# Patient Record
Sex: Female | Born: 1950 | Race: White | Hispanic: No | Marital: Single | Smoking: Never smoker
Health system: Southern US, Community
[De-identification: ages and names within clinical notes are randomized; demographics above are authoritative.]

## PROBLEM LIST (undated history)

## (undated) ENCOUNTER — Inpatient Hospital Stay: Admission: EM | Payer: Self-pay | Source: Home / Self Care

## (undated) DIAGNOSIS — F32A Depression, unspecified: Secondary | ICD-10-CM

## (undated) DIAGNOSIS — D509 Iron deficiency anemia, unspecified: Secondary | ICD-10-CM

## (undated) DIAGNOSIS — E669 Obesity, unspecified: Secondary | ICD-10-CM

## (undated) DIAGNOSIS — H269 Unspecified cataract: Secondary | ICD-10-CM

## (undated) DIAGNOSIS — E785 Hyperlipidemia, unspecified: Secondary | ICD-10-CM

## (undated) DIAGNOSIS — J45909 Unspecified asthma, uncomplicated: Secondary | ICD-10-CM

## (undated) DIAGNOSIS — E119 Type 2 diabetes mellitus without complications: Secondary | ICD-10-CM

## (undated) DIAGNOSIS — Z9289 Personal history of other medical treatment: Secondary | ICD-10-CM

## (undated) DIAGNOSIS — T7840XA Allergy, unspecified, initial encounter: Secondary | ICD-10-CM

## (undated) DIAGNOSIS — K219 Gastro-esophageal reflux disease without esophagitis: Secondary | ICD-10-CM

## (undated) DIAGNOSIS — M1990 Unspecified osteoarthritis, unspecified site: Secondary | ICD-10-CM

## (undated) DIAGNOSIS — I10 Essential (primary) hypertension: Secondary | ICD-10-CM

## (undated) DIAGNOSIS — IMO0002 Reserved for concepts with insufficient information to code with codable children: Secondary | ICD-10-CM

## (undated) DIAGNOSIS — C50919 Malignant neoplasm of unspecified site of unspecified female breast: Secondary | ICD-10-CM

## (undated) HISTORY — DX: Personal history of other medical treatment: Z92.89

## (undated) HISTORY — DX: Essential (primary) hypertension: I10

## (undated) HISTORY — DX: Allergy, unspecified, initial encounter: T78.40XA

## (undated) HISTORY — DX: Type 2 diabetes mellitus without complications: E11.9

## (undated) HISTORY — DX: Iron deficiency anemia, unspecified: D50.9

## (undated) HISTORY — DX: Unspecified cataract: H26.9

## (undated) HISTORY — DX: Hyperlipidemia, unspecified: E78.5

## (undated) HISTORY — DX: Unspecified osteoarthritis, unspecified site: M19.90

## (undated) HISTORY — DX: Reserved for concepts with insufficient information to code with codable children: IMO0002

## (undated) HISTORY — DX: Unspecified asthma, uncomplicated: J45.909

## (undated) HISTORY — PX: EYE SURGERY: SHX253

## (undated) HISTORY — DX: Obesity, unspecified: E66.9

## (undated) HISTORY — DX: Malignant neoplasm of unspecified site of unspecified female breast: C50.919

## (undated) HISTORY — PX: GASTRIC RESTRICTION SURGERY: SHX653

## (undated) HISTORY — DX: Depression, unspecified: F32.A

---

## 1974-07-14 HISTORY — PX: APPENDECTOMY: SHX54

## 1994-07-14 DIAGNOSIS — C50919 Malignant neoplasm of unspecified site of unspecified female breast: Secondary | ICD-10-CM

## 1994-07-14 HISTORY — DX: Malignant neoplasm of unspecified site of unspecified female breast: C50.919

## 1994-07-14 HISTORY — PX: BREAST LUMPECTOMY: SHX2

## 1997-11-01 ENCOUNTER — Ambulatory Visit (HOSPITAL_COMMUNITY): Admission: RE | Admit: 1997-11-01 | Discharge: 1997-11-01 | Payer: Self-pay | Admitting: Surgery

## 1999-01-14 ENCOUNTER — Ambulatory Visit (HOSPITAL_COMMUNITY): Admission: RE | Admit: 1999-01-14 | Discharge: 1999-01-14 | Payer: Self-pay | Admitting: Surgery

## 1999-01-18 ENCOUNTER — Other Ambulatory Visit: Admission: RE | Admit: 1999-01-18 | Discharge: 1999-01-18 | Payer: Self-pay | Admitting: Family Medicine

## 2000-01-20 ENCOUNTER — Encounter: Admission: RE | Admit: 2000-01-20 | Discharge: 2000-01-20 | Payer: Self-pay | Admitting: Family Medicine

## 2000-01-20 ENCOUNTER — Encounter: Payer: Self-pay | Admitting: Family Medicine

## 2000-02-18 ENCOUNTER — Other Ambulatory Visit: Admission: RE | Admit: 2000-02-18 | Discharge: 2000-02-18 | Payer: Self-pay | Admitting: Family Medicine

## 2001-01-19 ENCOUNTER — Encounter: Payer: Self-pay | Admitting: Family Medicine

## 2001-01-19 ENCOUNTER — Encounter: Admission: RE | Admit: 2001-01-19 | Discharge: 2001-01-19 | Payer: Self-pay | Admitting: Family Medicine

## 2001-01-20 ENCOUNTER — Encounter: Payer: Self-pay | Admitting: Internal Medicine

## 2001-01-20 ENCOUNTER — Ambulatory Visit (HOSPITAL_COMMUNITY): Admission: RE | Admit: 2001-01-20 | Discharge: 2001-01-20 | Payer: Self-pay | Admitting: Internal Medicine

## 2001-03-29 ENCOUNTER — Other Ambulatory Visit: Admission: RE | Admit: 2001-03-29 | Discharge: 2001-03-29 | Payer: Self-pay | Admitting: Family Medicine

## 2001-06-25 ENCOUNTER — Encounter: Payer: Self-pay | Admitting: Emergency Medicine

## 2001-06-25 ENCOUNTER — Emergency Department (HOSPITAL_COMMUNITY): Admission: EM | Admit: 2001-06-25 | Discharge: 2001-06-25 | Payer: Self-pay | Admitting: Emergency Medicine

## 2001-12-29 ENCOUNTER — Encounter: Payer: Self-pay | Admitting: Family Medicine

## 2001-12-29 ENCOUNTER — Encounter: Admission: RE | Admit: 2001-12-29 | Discharge: 2001-12-29 | Payer: Self-pay | Admitting: Family Medicine

## 2002-01-21 ENCOUNTER — Encounter: Payer: Self-pay | Admitting: Family Medicine

## 2002-01-21 ENCOUNTER — Encounter: Admission: RE | Admit: 2002-01-21 | Discharge: 2002-01-21 | Payer: Self-pay | Admitting: Family Medicine

## 2003-01-23 ENCOUNTER — Encounter: Payer: Self-pay | Admitting: Family Medicine

## 2003-01-23 ENCOUNTER — Encounter: Admission: RE | Admit: 2003-01-23 | Discharge: 2003-01-23 | Payer: Self-pay | Admitting: Family Medicine

## 2003-07-10 ENCOUNTER — Encounter: Admission: RE | Admit: 2003-07-10 | Discharge: 2003-07-10 | Payer: Self-pay | Admitting: Family Medicine

## 2004-02-02 ENCOUNTER — Encounter: Admission: RE | Admit: 2004-02-02 | Discharge: 2004-02-02 | Payer: Self-pay | Admitting: Hematology & Oncology

## 2004-04-17 ENCOUNTER — Encounter: Admission: RE | Admit: 2004-04-17 | Discharge: 2004-04-17 | Payer: Self-pay | Admitting: Family Medicine

## 2004-09-25 ENCOUNTER — Ambulatory Visit: Payer: Self-pay | Admitting: Internal Medicine

## 2004-10-08 ENCOUNTER — Ambulatory Visit: Payer: Self-pay | Admitting: Hematology & Oncology

## 2004-10-16 ENCOUNTER — Ambulatory Visit (HOSPITAL_COMMUNITY): Admission: RE | Admit: 2004-10-16 | Discharge: 2004-10-16 | Payer: Self-pay | Admitting: Hematology & Oncology

## 2005-02-03 ENCOUNTER — Encounter: Admission: RE | Admit: 2005-02-03 | Discharge: 2005-02-03 | Payer: Self-pay | Admitting: Hematology & Oncology

## 2005-04-23 ENCOUNTER — Ambulatory Visit: Payer: Self-pay | Admitting: Internal Medicine

## 2005-05-19 ENCOUNTER — Ambulatory Visit: Payer: Self-pay | Admitting: Family Medicine

## 2005-10-07 ENCOUNTER — Ambulatory Visit: Payer: Self-pay | Admitting: Hematology & Oncology

## 2005-11-07 ENCOUNTER — Ambulatory Visit: Payer: Self-pay | Admitting: Family Medicine

## 2005-11-07 ENCOUNTER — Encounter: Payer: Self-pay | Admitting: Family Medicine

## 2005-11-07 ENCOUNTER — Other Ambulatory Visit: Admission: RE | Admit: 2005-11-07 | Discharge: 2005-11-07 | Payer: Self-pay | Admitting: Family Medicine

## 2005-11-18 ENCOUNTER — Ambulatory Visit: Payer: Self-pay | Admitting: Family Medicine

## 2005-12-10 ENCOUNTER — Ambulatory Visit: Payer: Self-pay | Admitting: Family Medicine

## 2006-02-05 ENCOUNTER — Ambulatory Visit: Payer: Self-pay | Admitting: Family Medicine

## 2006-02-06 ENCOUNTER — Encounter: Admission: RE | Admit: 2006-02-06 | Discharge: 2006-02-06 | Payer: Self-pay | Admitting: Family Medicine

## 2006-04-21 ENCOUNTER — Ambulatory Visit: Payer: Self-pay | Admitting: Family Medicine

## 2006-06-11 ENCOUNTER — Ambulatory Visit: Payer: Self-pay | Admitting: Family Medicine

## 2006-07-16 ENCOUNTER — Ambulatory Visit: Payer: Self-pay | Admitting: Family Medicine

## 2006-10-05 ENCOUNTER — Ambulatory Visit: Payer: Self-pay | Admitting: Hematology & Oncology

## 2006-10-07 LAB — CBC WITH DIFFERENTIAL/PLATELET
BASO%: 0.5 % (ref 0.0–2.0)
Basophils Absolute: 0 10*3/uL (ref 0.0–0.1)
HCT: 35.5 % (ref 34.8–46.6)
HGB: 12.6 g/dL (ref 11.6–15.9)
LYMPH%: 30.9 % (ref 14.0–48.0)
MCH: 30.5 pg (ref 26.0–34.0)
MCHC: 35.4 g/dL (ref 32.0–36.0)
MONO#: 0.5 10*3/uL (ref 0.1–0.9)
NEUT%: 58.6 % (ref 39.6–76.8)
Platelets: 353 10*3/uL (ref 145–400)
WBC: 7.4 10*3/uL (ref 3.9–10.0)
lymph#: 2.3 10*3/uL (ref 0.9–3.3)

## 2006-10-07 LAB — COMPREHENSIVE METABOLIC PANEL
ALT: 21 U/L (ref 0–35)
Alkaline Phosphatase: 52 U/L (ref 39–117)
Creatinine, Ser: 0.53 mg/dL (ref 0.40–1.20)
Sodium: 139 mEq/L (ref 135–145)
Total Bilirubin: 0.3 mg/dL (ref 0.3–1.2)
Total Protein: 7.3 g/dL (ref 6.0–8.3)

## 2006-10-15 ENCOUNTER — Ambulatory Visit: Payer: Self-pay | Admitting: Family Medicine

## 2006-10-15 LAB — CONVERTED CEMR LAB
Albumin: 3.6 g/dL (ref 3.5–5.2)
BUN: 17 mg/dL (ref 6–23)
CO2: 28 meq/L (ref 19–32)
Calcium: 9.7 mg/dL (ref 8.4–10.5)
Chloride: 105 meq/L (ref 96–112)
Creatinine, Ser: 0.8 mg/dL (ref 0.4–1.2)
GFR calc Af Amer: 96 mL/min
GFR calc non Af Amer: 79 mL/min
Glucose, Bld: 116 mg/dL — ABNORMAL HIGH (ref 70–99)
Hgb A1c MFr Bld: 6.7 % — ABNORMAL HIGH (ref 4.6–6.0)
Phosphorus: 3.3 mg/dL (ref 2.3–4.6)
Potassium: 4.3 meq/L (ref 3.5–5.1)
Sodium: 142 meq/L (ref 135–145)

## 2006-11-19 ENCOUNTER — Ambulatory Visit: Payer: Self-pay | Admitting: Family Medicine

## 2006-11-19 DIAGNOSIS — L723 Sebaceous cyst: Secondary | ICD-10-CM | POA: Insufficient documentation

## 2007-02-08 ENCOUNTER — Encounter: Admission: RE | Admit: 2007-02-08 | Discharge: 2007-02-08 | Payer: Self-pay | Admitting: Hematology & Oncology

## 2007-02-09 ENCOUNTER — Encounter (INDEPENDENT_AMBULATORY_CARE_PROVIDER_SITE_OTHER): Payer: Self-pay | Admitting: *Deleted

## 2007-04-23 ENCOUNTER — Ambulatory Visit: Payer: Self-pay | Admitting: Family Medicine

## 2007-05-24 ENCOUNTER — Telehealth: Payer: Self-pay | Admitting: Family Medicine

## 2007-07-19 ENCOUNTER — Ambulatory Visit: Payer: Self-pay | Admitting: Family Medicine

## 2007-07-19 DIAGNOSIS — M549 Dorsalgia, unspecified: Secondary | ICD-10-CM | POA: Insufficient documentation

## 2007-07-29 ENCOUNTER — Telehealth: Payer: Self-pay | Admitting: Family Medicine

## 2007-07-30 ENCOUNTER — Encounter: Payer: Self-pay | Admitting: Family Medicine

## 2007-08-04 ENCOUNTER — Encounter: Payer: Self-pay | Admitting: Family Medicine

## 2007-10-05 ENCOUNTER — Ambulatory Visit: Payer: Self-pay | Admitting: Hematology & Oncology

## 2007-10-07 ENCOUNTER — Encounter: Payer: Self-pay | Admitting: Family Medicine

## 2007-11-30 ENCOUNTER — Encounter (INDEPENDENT_AMBULATORY_CARE_PROVIDER_SITE_OTHER): Payer: Self-pay | Admitting: *Deleted

## 2007-12-07 ENCOUNTER — Ambulatory Visit: Payer: Self-pay | Admitting: Family Medicine

## 2007-12-07 DIAGNOSIS — J309 Allergic rhinitis, unspecified: Secondary | ICD-10-CM | POA: Insufficient documentation

## 2007-12-07 DIAGNOSIS — L538 Other specified erythematous conditions: Secondary | ICD-10-CM | POA: Insufficient documentation

## 2008-01-26 ENCOUNTER — Ambulatory Visit: Payer: Self-pay | Admitting: Family Medicine

## 2008-01-26 DIAGNOSIS — E785 Hyperlipidemia, unspecified: Secondary | ICD-10-CM

## 2008-01-26 DIAGNOSIS — E1169 Type 2 diabetes mellitus with other specified complication: Secondary | ICD-10-CM | POA: Insufficient documentation

## 2008-01-26 DIAGNOSIS — I10 Essential (primary) hypertension: Secondary | ICD-10-CM | POA: Insufficient documentation

## 2008-03-07 ENCOUNTER — Telehealth (INDEPENDENT_AMBULATORY_CARE_PROVIDER_SITE_OTHER): Payer: Self-pay | Admitting: *Deleted

## 2008-03-08 ENCOUNTER — Telehealth: Payer: Self-pay | Admitting: Family Medicine

## 2008-03-10 ENCOUNTER — Encounter: Admission: RE | Admit: 2008-03-10 | Discharge: 2008-03-10 | Payer: Self-pay | Admitting: Family Medicine

## 2008-03-13 ENCOUNTER — Encounter (INDEPENDENT_AMBULATORY_CARE_PROVIDER_SITE_OTHER): Payer: Self-pay | Admitting: *Deleted

## 2008-03-31 ENCOUNTER — Ambulatory Visit: Payer: Self-pay | Admitting: Family Medicine

## 2008-04-05 LAB — CONVERTED CEMR LAB
ALT: 24 units/L (ref 0–35)
AST: 25 units/L (ref 0–37)
Albumin: 3.8 g/dL (ref 3.5–5.2)
Alkaline Phosphatase: 48 units/L (ref 39–117)
BUN: 11 mg/dL (ref 6–23)
Basophils Absolute: 0.1 10*3/uL (ref 0.0–0.1)
Basophils Relative: 0.9 % (ref 0.0–3.0)
Bilirubin, Direct: 0.1 mg/dL (ref 0.0–0.3)
CO2: 27 meq/L (ref 19–32)
Calcium: 9.2 mg/dL (ref 8.4–10.5)
Chloride: 108 meq/L (ref 96–112)
Cholesterol: 133 mg/dL (ref 0–200)
Creatinine, Ser: 0.6 mg/dL (ref 0.4–1.2)
Direct LDL: 75.1 mg/dL
Eosinophils Absolute: 0.3 10*3/uL (ref 0.0–0.7)
Eosinophils Relative: 4.1 % (ref 0.0–5.0)
GFR calc Af Amer: 133 mL/min
GFR calc non Af Amer: 110 mL/min
Glucose, Bld: 137 mg/dL — ABNORMAL HIGH (ref 70–99)
HCT: 36.4 % (ref 36.0–46.0)
HDL: 30.7 mg/dL — ABNORMAL LOW (ref >39.0)
Hemoglobin: 12.9 g/dL (ref 12.0–15.0)
Lymphocytes Relative: 31.8 % (ref 12.0–46.0)
MCHC: 35.5 g/dL (ref 30.0–36.0)
MCV: 87.6 fL (ref 78.0–100.0)
Monocytes Absolute: 0.5 10*3/uL (ref 0.1–1.0)
Monocytes Relative: 7.1 % (ref 3.0–12.0)
Neutro Abs: 4 10*3/uL (ref 1.4–7.7)
Neutrophils Relative %: 56.1 % (ref 43.0–77.0)
Phosphorus: 3.5 mg/dL (ref 2.3–4.6)
Platelets: 363 10*3/uL (ref 150–400)
Potassium: 4.1 meq/L (ref 3.5–5.1)
RBC: 4.16 M/uL (ref 3.87–5.11)
RDW: 12.9 % (ref 11.5–14.6)
Sodium: 142 meq/L (ref 135–145)
TSH: 1.73 microintl units/mL (ref 0.35–5.50)
Total Bilirubin: 0.6 mg/dL (ref 0.3–1.2)
Total CHOL/HDL Ratio: 4.3
Total Protein: 7.7 g/dL (ref 6.0–8.3)
Triglycerides: 206 mg/dL (ref 0–149)
VLDL: 41 mg/dL — ABNORMAL HIGH (ref 0–40)
WBC: 7.2 10*3/uL (ref 4.5–10.5)

## 2008-04-10 ENCOUNTER — Ambulatory Visit: Payer: Self-pay | Admitting: Family Medicine

## 2008-04-13 ENCOUNTER — Ambulatory Visit: Payer: Self-pay | Admitting: Family Medicine

## 2008-05-19 ENCOUNTER — Telehealth: Payer: Self-pay | Admitting: Family Medicine

## 2008-05-23 ENCOUNTER — Encounter: Payer: Self-pay | Admitting: Family Medicine

## 2008-06-05 ENCOUNTER — Telehealth (INDEPENDENT_AMBULATORY_CARE_PROVIDER_SITE_OTHER): Payer: Self-pay | Admitting: *Deleted

## 2008-06-27 ENCOUNTER — Telehealth (INDEPENDENT_AMBULATORY_CARE_PROVIDER_SITE_OTHER): Payer: Self-pay | Admitting: *Deleted

## 2008-07-03 ENCOUNTER — Ambulatory Visit: Payer: Self-pay | Admitting: Family Medicine

## 2008-07-04 LAB — CONVERTED CEMR LAB
Albumin: 3.6 g/dL (ref 3.5–5.2)
BUN: 13 mg/dL (ref 6–23)
CO2: 29 meq/L (ref 19–32)
Calcium: 9.3 mg/dL (ref 8.4–10.5)
Chloride: 105 meq/L (ref 96–112)
Creatinine, Ser: 0.6 mg/dL (ref 0.4–1.2)
GFR calc Af Amer: 133 mL/min
GFR calc non Af Amer: 110 mL/min
Glucose, Bld: 147 mg/dL — ABNORMAL HIGH (ref 70–99)
Hgb A1c MFr Bld: 7.8 % — ABNORMAL HIGH (ref 4.6–6.0)
Phosphorus: 3.5 mg/dL (ref 2.3–4.6)
Potassium: 4.2 meq/L (ref 3.5–5.1)
Sodium: 141 meq/L (ref 135–145)

## 2008-07-10 ENCOUNTER — Ambulatory Visit: Payer: Self-pay | Admitting: Family Medicine

## 2008-07-10 DIAGNOSIS — E119 Type 2 diabetes mellitus without complications: Secondary | ICD-10-CM | POA: Insufficient documentation

## 2008-08-01 ENCOUNTER — Ambulatory Visit: Payer: Self-pay | Admitting: Family Medicine

## 2008-08-09 ENCOUNTER — Encounter: Payer: Self-pay | Admitting: Family Medicine

## 2008-10-03 ENCOUNTER — Ambulatory Visit: Payer: Self-pay | Admitting: Family Medicine

## 2008-10-03 LAB — CONVERTED CEMR LAB: Rapid Strep: NEGATIVE

## 2008-10-31 ENCOUNTER — Ambulatory Visit: Payer: Self-pay | Admitting: Family Medicine

## 2008-10-31 DIAGNOSIS — M19049 Primary osteoarthritis, unspecified hand: Secondary | ICD-10-CM | POA: Insufficient documentation

## 2008-11-13 LAB — CONVERTED CEMR LAB
ALT: 28 units/L (ref 0–35)
AST: 26 units/L (ref 0–37)
Albumin: 3.7 g/dL (ref 3.5–5.2)
BUN: 13 mg/dL (ref 6–23)
CO2: 29 meq/L (ref 19–32)
Calcium: 9.5 mg/dL (ref 8.4–10.5)
Chloride: 106 meq/L (ref 96–112)
Cholesterol: 124 mg/dL (ref 0–200)
Creatinine, Ser: 0.5 mg/dL (ref 0.4–1.2)
Direct LDL: 57.5 mg/dL
Glucose, Bld: 125 mg/dL — ABNORMAL HIGH (ref 70–99)
HDL: 36 mg/dL — ABNORMAL LOW (ref >39.00)
Hgb A1c MFr Bld: 6.7 % — ABNORMAL HIGH (ref 4.6–6.5)
Phosphorus: 4.2 mg/dL (ref 2.3–4.6)
Potassium: 3.9 meq/L (ref 3.5–5.1)
Sodium: 141 meq/L (ref 135–145)
Total CHOL/HDL Ratio: 3
Triglycerides: 238 mg/dL — ABNORMAL HIGH (ref 0.0–149.0)
VLDL: 47.6 mg/dL — ABNORMAL HIGH (ref 0.0–40.0)

## 2009-02-09 ENCOUNTER — Ambulatory Visit: Payer: Self-pay | Admitting: Family Medicine

## 2009-03-12 ENCOUNTER — Encounter: Admission: RE | Admit: 2009-03-12 | Discharge: 2009-03-12 | Payer: Self-pay | Admitting: Family Medicine

## 2009-03-14 ENCOUNTER — Encounter (INDEPENDENT_AMBULATORY_CARE_PROVIDER_SITE_OTHER): Payer: Self-pay | Admitting: *Deleted

## 2009-04-02 ENCOUNTER — Encounter: Payer: Self-pay | Admitting: Family Medicine

## 2009-04-10 ENCOUNTER — Ambulatory Visit: Payer: Self-pay | Admitting: Family Medicine

## 2009-04-23 ENCOUNTER — Ambulatory Visit: Payer: Self-pay | Admitting: Family Medicine

## 2009-04-25 LAB — CONVERTED CEMR LAB
ALT: 28 units/L (ref 0–35)
AST: 25 units/L (ref 0–37)
Albumin: 4 g/dL (ref 3.5–5.2)
BUN: 17 mg/dL (ref 6–23)
CO2: 28 meq/L (ref 19–32)
Calcium: 9.2 mg/dL (ref 8.4–10.5)
Chloride: 101 meq/L (ref 96–112)
Cholesterol: 135 mg/dL (ref 0–200)
Creatinine, Ser: 0.6 mg/dL (ref 0.4–1.2)
Creatinine,U: 73.6 mg/dL
Direct LDL: 68.1 mg/dL
GFR calc non Af Amer: 109.04 mL/min (ref >60)
Glucose, Bld: 108 mg/dL — ABNORMAL HIGH (ref 70–99)
HDL: 37.2 mg/dL — ABNORMAL LOW (ref >39.00)
Hgb A1c MFr Bld: 6.1 % (ref 4.6–6.5)
Microalb Creat Ratio: 8.2 mg/g (ref 0.0–30.0)
Microalb, Ur: 0.6 mg/dL (ref 0.0–1.9)
Phosphorus: 3.8 mg/dL (ref 2.3–4.6)
Potassium: 4.5 meq/L (ref 3.5–5.1)
Sodium: 138 meq/L (ref 135–145)
Total CHOL/HDL Ratio: 4
Triglycerides: 211 mg/dL — ABNORMAL HIGH (ref 0.0–149.0)
VLDL: 42.2 mg/dL — ABNORMAL HIGH (ref 0.0–40.0)

## 2009-04-30 ENCOUNTER — Ambulatory Visit: Payer: Self-pay | Admitting: Family Medicine

## 2009-04-30 LAB — CONVERTED CEMR LAB
Cholesterol, target level: 200 mg/dL
HDL goal, serum: 40 mg/dL
LDL Goal: 100 mg/dL

## 2009-06-11 ENCOUNTER — Telehealth: Payer: Self-pay | Admitting: Family Medicine

## 2009-06-18 ENCOUNTER — Encounter: Payer: Self-pay | Admitting: Family Medicine

## 2009-06-18 ENCOUNTER — Telehealth: Payer: Self-pay | Admitting: Family Medicine

## 2009-06-21 LAB — HM DIABETES EYE EXAM: HM Diabetic Eye Exam: NORMAL

## 2009-10-22 ENCOUNTER — Ambulatory Visit: Payer: Self-pay | Admitting: Family Medicine

## 2009-10-22 LAB — CONVERTED CEMR LAB
ALT: 25 units/L (ref 0–35)
AST: 26 units/L (ref 0–37)
Albumin: 3.8 g/dL (ref 3.5–5.2)
BUN: 13 mg/dL (ref 6–23)
Basophils Absolute: 0 10*3/uL (ref 0.0–0.1)
Basophils Relative: 0.4 % (ref 0.0–3.0)
CO2: 27 meq/L (ref 19–32)
Calcium: 8.8 mg/dL (ref 8.4–10.5)
Chloride: 106 meq/L (ref 96–112)
Cholesterol: 118 mg/dL (ref 0–200)
Creatinine, Ser: 0.5 mg/dL (ref 0.4–1.2)
Direct LDL: 57.7 mg/dL
Eosinophils Absolute: 0.3 10*3/uL (ref 0.0–0.7)
Eosinophils Relative: 3.8 % (ref 0.0–5.0)
GFR calc non Af Amer: 134.34 mL/min (ref >60)
Glucose, Bld: 118 mg/dL — ABNORMAL HIGH (ref 70–99)
HCT: 36.2 % (ref 36.0–46.0)
HDL: 38.6 mg/dL — ABNORMAL LOW (ref >39.00)
Hemoglobin: 12.3 g/dL (ref 12.0–15.0)
Hgb A1c MFr Bld: 6.3 % (ref 4.6–6.5)
Lymphocytes Relative: 34 % (ref 12.0–46.0)
Lymphs Abs: 2.5 10*3/uL (ref 0.7–4.0)
MCHC: 33.9 g/dL (ref 30.0–36.0)
MCV: 89.8 fL (ref 78.0–100.0)
Monocytes Absolute: 0.6 10*3/uL (ref 0.1–1.0)
Monocytes Relative: 7.7 % (ref 3.0–12.0)
Neutro Abs: 3.9 10*3/uL (ref 1.4–7.7)
Neutrophils Relative %: 54.1 % (ref 43.0–77.0)
Phosphorus: 3.6 mg/dL (ref 2.3–4.6)
Platelets: 368 10*3/uL (ref 150.0–400.0)
Potassium: 3.6 meq/L (ref 3.5–5.1)
RBC: 4.03 M/uL (ref 3.87–5.11)
RDW: 13.5 % (ref 11.5–14.6)
Sodium: 142 meq/L (ref 135–145)
TSH: 1.71 microintl units/mL (ref 0.35–5.50)
Total CHOL/HDL Ratio: 3
Triglycerides: 242 mg/dL — ABNORMAL HIGH (ref 0.0–149.0)
VLDL: 48.4 mg/dL — ABNORMAL HIGH (ref 0.0–40.0)
WBC: 7.3 10*3/uL (ref 4.5–10.5)

## 2009-10-25 ENCOUNTER — Ambulatory Visit: Payer: Self-pay | Admitting: Family Medicine

## 2010-01-07 ENCOUNTER — Telehealth: Payer: Self-pay | Admitting: Family Medicine

## 2010-01-30 ENCOUNTER — Encounter: Payer: Self-pay | Admitting: Family Medicine

## 2010-03-13 ENCOUNTER — Telehealth: Payer: Self-pay | Admitting: Family Medicine

## 2010-03-13 ENCOUNTER — Encounter: Admission: RE | Admit: 2010-03-13 | Discharge: 2010-03-13 | Payer: Self-pay | Admitting: Family Medicine

## 2010-03-15 ENCOUNTER — Encounter: Payer: Self-pay | Admitting: Family Medicine

## 2010-04-02 ENCOUNTER — Ambulatory Visit: Payer: Self-pay | Admitting: Family Medicine

## 2010-04-29 ENCOUNTER — Ambulatory Visit: Payer: Self-pay | Admitting: Family Medicine

## 2010-04-29 LAB — CONVERTED CEMR LAB
ALT: 26 units/L (ref 0–35)
AST: 28 units/L (ref 0–37)
Albumin: 4 g/dL (ref 3.5–5.2)
BUN: 13 mg/dL (ref 6–23)
CO2: 28 meq/L (ref 19–32)
Calcium: 9.7 mg/dL (ref 8.4–10.5)
Chloride: 105 meq/L (ref 96–112)
Cholesterol: 127 mg/dL (ref 0–200)
Creatinine, Ser: 0.6 mg/dL (ref 0.4–1.2)
Creatinine,U: 91.9 mg/dL
GFR calc non Af Amer: 112.99 mL/min (ref >60)
Glucose, Bld: 119 mg/dL — ABNORMAL HIGH (ref 70–99)
HDL: 39.2 mg/dL (ref >39.00)
Hgb A1c MFr Bld: 6.4 % (ref 4.6–6.5)
LDL Cholesterol: 48 mg/dL (ref 0–99)
Microalb Creat Ratio: 1.2 mg/g (ref 0.0–30.0)
Microalb, Ur: 1.1 mg/dL (ref 0.0–1.9)
Phosphorus: 3.4 mg/dL (ref 2.3–4.6)
Potassium: 4.5 meq/L (ref 3.5–5.1)
Sodium: 142 meq/L (ref 135–145)
Total CHOL/HDL Ratio: 3
Triglycerides: 198 mg/dL — ABNORMAL HIGH (ref 0.0–149.0)
VLDL: 39.6 mg/dL (ref 0.0–40.0)

## 2010-05-01 ENCOUNTER — Ambulatory Visit: Payer: Self-pay | Admitting: Family Medicine

## 2010-07-01 LAB — HM DIABETES FOOT EXAM

## 2010-08-15 NOTE — Assessment & Plan Note (Signed)
Summary: 6 month followup/rbh   Vital Signs:  Patient profile:   60 year old female Height:      60 inches Weight:      163.25 pounds BMI:     32.00 Temp:     98.3 degrees F oral Pulse rate:   88 / minute Pulse rhythm:   regular BP sitting:   130 / 80  (left arm) Cuff size:   large  Vitals Entered By: Lewanda Rife LPN (May 01, 2010 3:28 PM) CC: six month f/u after labs   History of Present Illness: here for f/u of HTN and DM and lipids  all is well , nothing new  mother is doing better - had ccy  got a Wii for her birthday has been exercising with the [employer] fit and loves it -- does it for 1 hour per day and feels better  lost 8 lb so far  she does not have to leave the house so less stressful     wt is up 1 lb from last visit here   HTN in control --138/80 first check today  AIC is 6.4- fairly stable  utd opthy and imms  chol improved with trig 198 and HDL 39 and LDL 48 from 57  is eating very healthy as well  eating lots of chicken breasts / lean protein and [place]      Allergies: 1)  ! Lipitor 2)  ! Niacin 3)  ! Lanetta Inch  Past History:  Past Medical History: Last updated: 10/31/2008 past hx of breast ca (cancer free 13 years in 2009- released by Dr Twanna Hy) allergic rhinitis DM2  HTN  obesity OA - hands   Family History: Last updated: 07/10/2008 mother- DM,, multiple problems sister - DM, deceased of stroke  Social History: Last updated: 01/26/2008 non smoker no alcohol  lives with and cares for her elderly mother   Risk Factors: Smoking Status: never (11/19/2006)  Review of Systems General:  Denies chills, fatigue, fever, loss of appetite, and malaise. Eyes:  Denies blurring and eye irritation. CV:  Denies chest pain or discomfort, palpitations, shortness of breath with exertion, and swelling of feet. Resp:  Denies cough and shortness of breath. GI:  Denies abdominal pain, change in bowel habits, indigestion, nausea, and  vomiting. GU:  Denies urinary frequency. MS:  Denies muscle aches and cramps. Derm:  Denies itching, lesion(s), and rash. Neuro:  Denies numbness and tingling. Psych:  Denies anxiety and depression. Endo:  Denies cold intolerance, excessive thirst, excessive urination, and heat intolerance. Heme:  Denies abnormal bruising and bleeding.  Physical Exam  General:  overweight but generally well appearing  Head:  normocephalic, atraumatic, and no abnormalities observed.   Eyes:  vision grossly intact, pupils equal, pupils round, pupils reactive to light, and no injection.   Mouth:  pharynx pink and moist.   Neck:  supple with full rom and no masses or thyromegally, no JVD or carotid bruit  Lungs:  Normal respiratory effort, chest expands symmetrically. Lungs are clear to auscultation, no crackles or wheezes. Heart:  Normal rate and regular rhythm. S1 and S2 normal without gallop, murmur, click, rub or other extra sounds. Abdomen:  soft and non-tender.  no renal bruits  Msk:  No deformity or scoliosis noted of thoracic or lumbar spine.  OA changes in fingers  Pulses:  R and L carotid,radial,femoral,dorsalis pedis and posterior tibial pulses are full and equal bilaterally Extremities:  No clubbing, cyanosis, edema, or deformity noted with normal  full range of motion of all joints.   Neurologic:  sensation intact to light touch, gait normal, and DTRs symmetrical and normal.   Skin:  Intact without suspicious lesions or rashes Cervical Nodes:  No lymphadenopathy noted Inguinal Nodes:  No significant adenopathy Psych:  normal affect, talkative and pleasant   Diabetes Management Exam:    Foot Exam (with socks and/or shoes not present):       Sensory-Pinprick/Light touch:          Left medial foot (L-4): normal          Left dorsal foot (L-5): normal          Left lateral foot (S-1): normal          Right medial foot (L-4): normal          Right dorsal foot (L-5): normal          Right  lateral foot (S-1): normal       Sensory-Monofilament:          Left foot: normal          Right foot: normal       Inspection:          Left foot: normal          Right foot: normal       Nails:          Left foot: normal          Right foot: normal   Impression & Recommendations:  Problem # 1:  DIABETES MELLITUS, TYPE II (ICD-250.00) Assessment Unchanged  this is stable with good diet and exercise and current metformin re check 6 mo and f/u check up  good foot care / utd opthy and imms Her updated medication list for this problem includes:    Cozaar 100 Mg Tabs (Losartan potassium) .Marland Kitchen... 1 by mouth once daily    Aspirin 81 Mg Tbec (Aspirin) .Marland Kitchen... 1 daily    Metformin Hcl 500 Mg Tabs (Metformin hcl) .Marland Kitchen... 1 by mouth two times a day with breakfast and evening meal  Labs Reviewed: Creat: 0.6 (04/29/2010)     Last Eye Exam: normal (06/21/2009) Reviewed HgBA1c results: 6.4 (04/29/2010)  6.3 (10/22/2009)  Problem # 2:  HYPERLIPIDEMIA (ICD-272.4) Assessment: Unchanged  is well controlled  no side eff on 2 meds refils done  rev low sat fat diet  Her updated medication list for this problem includes:    Tricor 145 Mg Tabs (Fenofibrate) .Marland Kitchen... 1 by mouth once daily    Zocor 40 Mg Tabs (Simvastatin) .Marland Kitchen... 1 by mouth once daily  Labs Reviewed: SGOT: 28 (04/29/2010)   SGPT: 26 (04/29/2010)  Lipid Goals: Chol Goal: 200 (04/30/2009)   HDL Goal: 40 (04/30/2009)   LDL Goal: 100 (04/30/2009)   TG Goal: 150 (04/30/2009)  Prior 10 Yr Risk Heart Disease: Not enough information (04/30/2009)   HDL:39.20 (04/29/2010), 38.60 (10/22/2009)  LDL:48 (04/29/2010), DEL (29/56/2130)  Chol:127 (04/29/2010), 118 (10/22/2009)  Trig:198.0 (04/29/2010), 242.0 (10/22/2009)  Problem # 3:  HYPERTENSION, ESSENTIAL NOS (ICD-401.9) Assessment: Unchanged  good control with cozaar enc to keep exercising  lab and check up in 6 mo  Her updated medication list for this problem includes:    Cozaar 100 Mg  Tabs (Losartan potassium) .Marland Kitchen... 1 by mouth once daily  BP today: 130/80 Prior BP: 152/84 (10/25/2009)  Prior 10 Yr Risk Heart Disease: Not enough information (04/30/2009)  Labs Reviewed: K+: 4.5 (04/29/2010) Creat: : 0.6 (04/29/2010)   Chol: 127 (04/29/2010)  HDL: 39.20 (04/29/2010)   LDL: 48 (04/29/2010)   TG: 198.0 (04/29/2010)  Complete Medication List: 1)  Tricor 145 Mg Tabs (Fenofibrate) .Marland Kitchen.. 1 by mouth once daily 2)  Zocor 40 Mg Tabs (Simvastatin) .Marland Kitchen.. 1 by mouth once daily 3)  Amitriptyline Hcl 25 Mg Tabs (Amitriptyline hcl) .Marland Kitchen.. 1 by mouth at bedtime 4)  Cozaar 100 Mg Tabs (Losartan potassium) .Marland Kitchen.. 1 by mouth once daily 5)  Prilosec 20 Mg Cpdr (Omeprazole) .Marland Kitchen.. 1 by mouth once daily 6)  Loratadine 10 Mg Tabs (Loratadine) .Marland Kitchen.. 1 daily 7)  Aspirin 81 Mg Tbec (Aspirin) .Marland Kitchen.. 1 daily 8)  Meclizine Hcl 25 Mg Tabs (Meclizine hcl) .... Take one by mouth three times a day as needed 9)  Omega-3 Fish Oil 1000 Mg Caps (Omega-3 fatty acids) .... One by mouth two times a day 10)  Metformin Hcl 500 Mg Tabs (Metformin hcl) .Marland Kitchen.. 1 by mouth two times a day with breakfast and evening meal 11)  Centrum Silver Tabs (Multiple vitamins-minerals) .... Take 1 tablet by mouth once a day 12)  Vitamin D 1000 Unit Tabs (Cholecalciferol) .... One daily 13)  Accu-chek Compact Strp (Glucose blood) .... To check sugar once daily for dm 2 250.00 14)  Accu-chek Softclix Lancets Misc (Lancets) .... To check sugar two times a day for dm 2 250.00 15)  Voltaren 1 % Gel (Diclofenac sodium) .... Apply to affected areas as needed. 16)  Ibuprofen 200 Mg Tabs (Ibuprofen) .... Otc as directed. 17)  Desonide 0.05 % Lotn (Desonide) .... Apply small amount to affected area once daily as needed. (for recurrent rash under breast)  Patient Instructions: 1)  no change in medicines  2)  keep up the great exercise and diet and weight loss 3)  schedule fasting labs and then f/u for 30 minute check up in 6 months  4)  wellness/  lipid/AIC v70.0, 401.1, 272, 250.0 Prescriptions: ACCU-CHEK COMPACT  STRP (GLUCOSE BLOOD) to check sugar once daily for DM 2 250.00  #3 months x 3   Entered and Authorized by:   Judith Part MD   Signed by:   Judith Part MD on 05/05/2010   Method used:   Historical   RxID:   [id number] DESONIDE 0.05 % LOTN (DESONIDE) apply small amount to affected area once daily as needed. (for recurrent rash under breast)  #3 months x 3   Entered and Authorized by:   Judith Part MD   Signed by:   Judith Part MD on 05/01/2010   Method used:   Print then Give to Patient   RxID:   [id number] PRILOSEC 20 MG CPDR (OMEPRAZOLE) 1 by mouth once daily  #90 x 3   Entered and Authorized by:   Judith Part MD   Signed by:   Judith Part MD on 05/01/2010   Method used:   Print then Give to Patient   RxID:   [id number] COZAAR 100 MG TABS (LOSARTAN POTASSIUM) 1 by mouth once daily  #90 x 3   Entered and Authorized by:   Judith Part MD   Signed by:   Judith Part MD on 05/01/2010   Method used:   Print then Give to Patient   RxID:   [id number] ZOCOR 40 MG TABS (SIMVASTATIN) 1 by mouth once daily  #90 x 3   Entered and Authorized by:   Judith Part MD   Signed by:   Judith Part MD on 05/01/2010  Method used:   Print then Give to Patient   RxID:   [id number] TRICOR 145 MG TABS (FENOFIBRATE) 1 by mouth once daily  #90 x 3   Entered and Authorized by:   Judith Part MD   Signed by:   Judith Part MD on 05/01/2010   Method used:   Print then Give to Patient   RxID:   [id number] AMITRIPTYLINE HCL 25 MG TABS (AMITRIPTYLINE HCL) 1 by mouth at bedtime  #90 x 3   Entered and Authorized by:   Judith Part MD   Signed by:   Judith Part MD on 05/01/2010   Method used:   Historical   RxID:   [id number]    Orders Added: 1)  Est. Patient Level IV [53664]    Current Allergies (reviewed today): ! LIPITOR ! NIACIN ! Lanetta Inch

## 2010-08-15 NOTE — Progress Notes (Signed)
Summary: Meclizine 25mg   Phone Note Refill Request Call back at [phone] Message from:  [facility] on March 13, 2010 11:01 AM  Refills Requested: Medication #1:  MECLIZINE HCL 25 MG  TABS take one by mouth three times a day as needed   Last Refilled: 11/30/2007 [facility] with [facility] electronically request refill on Meclizine 25mg . Pt last refilled 11/30/2007. Please advise.    Method Requested: Telephone to Pharmacy Initial call taken by: Lewanda Rife LPN,  March 13, 2010 11:02 AM  Follow-up for Phone Call        Patient called to let you know that the reason she is asking for a refill is because she is having issues w/ inner ear the last couple of day.  Melody Comas  March 13, 2010 11:17 AM   px written on EMR for call in  f/u if not improved   Follow-up by: Judith Part MD,  March 13, 2010 11:26 AM  Additional Follow-up for Phone Call Additional follow up Details #1::        Medication phoned to [facility] pharmacy as instructed. Patient notified as instructed by telephone. Lewanda Rife LPN  March 13, 2010 11:35 AM     New/Updated Medications: MECLIZINE HCL 25 MG  TABS (MECLIZINE HCL) take one by mouth three times a day as needed Prescriptions: MECLIZINE HCL 25 MG  TABS (MECLIZINE HCL) take one by mouth three times a day as needed  #30 x 0   Entered and Authorized by:   Judith Part MD   Signed by:   Lewanda Rife LPN on 11/91/4782   Method used:   Telephoned to ...         RxID:   [id number]

## 2010-08-15 NOTE — Medication Information (Signed)
Summary: Amitriptyline & Accu Check Strips/[facility]  Amitriptyline & Accu Check Strips/[facility]   Imported By: Lanelle Bal 05/13/2010 12:01:08  _____________________________________________________________________  External Attachment:    Type:   Image     Comment:   External Document

## 2010-08-15 NOTE — Assessment & Plan Note (Signed)
Summary: FLU SHOT/CLE  Nurse Visit   Allergies: 1)  ! Lipitor 2)  ! Niacin 3)  ! * Questran  Immunizations Administered:  Influenza Vaccine # 1:    Vaccine Type: Fluvax 3+    Site: left deltoid    Mfr: GlaxoSmithKline    Dose: 0.5 ml    Route: IM    Given by: Mervin Hack CMA (AAMA)    Exp. Date: 01/11/2011    Lot #: [id number]    VIS given: 02/05/10 version given April 02, 2010.  Flu Vaccine Consent Questions:    Do you have a history of severe allergic reactions to this vaccine? no    Any prior history of allergic reactions to egg and/or gelatin? no    Do you have a sensitivity to the preservative Thimersol? no    Do you have a past history of Guillan-Barre Syndrome? no    Do you currently have an acute febrile illness? no    Have you ever had a severe reaction to latex? no    Vaccine information given and explained to patient? yes    Are you currently pregnant? no  Orders Added: 1)  Flu Vaccine 64yrs + [90658] 2)  Admin 1st Vaccine [78469]

## 2010-08-15 NOTE — Assessment & Plan Note (Signed)
Summary: F/U AFTER LABS / LFW   Vital Signs:  Patient profile:   60 year old female Height:      60 inches Weight:      165.25 pounds BMI:     32.39 Temp:     98.1 degrees F oral Pulse rate:   88 / minute Pulse rhythm:   regular BP sitting:   152 / 84  (left arm) Cuff size:   regular  Vitals Entered By: Lewanda Rife LPN (October 25, 2009 8:06 AM) CC: six month f/u after labs   History of Present Illness: here for f/u of HTN and lipids and DM2  has been doing great overall  has started walking regularly - that is her top priority- to walk twice per day  feels good and nothing new    wt is up 2 lb with bmi of 32  DM is fairly stable with AIC 6.3 (this is up a bit from 6.1) is doing well with her diet  sugars are just about the same -- beter at night now - eating less  opthy-- in december 9th -- all was good -- sees Dr Oren Bracket    HTN -- bp up on first chekc today-- has not taken med yet this am - left house too early   lipids stable - with slt high trig 242 (LDL is 57 within goal)  overall good - and no sat fats in diet -- is very compliant with that no fatty foods/ fried foods or red meat at all    Allergies: 1)  ! Lipitor 2)  ! Niacin 3)  ! * Questran  Review of Systems General:  Denies fatigue, fever, loss of appetite, and malaise. Eyes:  Denies blurring and eye pain. CV:  Denies chest pain or discomfort, lightheadness, palpitations, and shortness of breath with exertion. Resp:  Denies cough and wheezing. GI:  Denies abdominal pain, bloody stools, change in bowel habits, and nausea. GU:  Denies urinary frequency. MS:  Denies mid back pain. Derm:  Denies itching, lesion(s), poor wound healing, and rash. Neuro:  Denies numbness and tingling. Psych:  Denies anxiety and depression; she is handing stress well . Endo:  Denies cold intolerance, excessive thirst, excessive urination, and heat intolerance. Heme:  Denies abnormal bruising and bleeding.  Physical  Exam  General:  overweight but generally well appearing  Head:  normocephalic, atraumatic, and no abnormalities observed.   Eyes:  vision grossly intact, pupils equal, pupils round, pupils reactive to light, and no injection.   Mouth:  pharynx pink and moist and no exudates.   mild post injection - strep test neg Neck:  supple with full rom and no masses or thyromegally, no JVD or carotid bruit  Lungs:  Normal respiratory effort, chest expands symmetrically. Lungs are clear to auscultation, no crackles or wheezes. Heart:  Normal rate and regular rhythm. S1 and S2 normal without gallop, murmur, click, rub or other extra sounds. Abdomen:  soft and non-tender.  no renal bruits  Msk:  No deformity or scoliosis noted of thoracic or lumbar spine.  OA changes in fingers  Pulses:  R and L carotid,radial,femoral,dorsalis pedis and posterior tibial pulses are full and equal bilaterally Extremities:  No clubbing, cyanosis, edema, or deformity noted with normal full range of motion of all joints.   Neurologic:  sensation intact to light touch, gait normal, and DTRs symmetrical and normal.   Skin:  Intact without suspicious lesions or rashes Cervical Nodes:  No lymphadenopathy  noted Psych:  normal affect, talkative and pleasant -- as always very positive attitude  Diabetes Management Exam:    Foot Exam (with socks and/or shoes not present):       Sensory-Pinprick/Light touch:          Left medial foot (L-4): normal          Left dorsal foot (L-5): normal          Left lateral foot (S-1): normal          Right medial foot (L-4): normal          Right dorsal foot (L-5): normal          Right lateral foot (S-1): normal       Sensory-Monofilament:          Left foot: normal          Right foot: normal       Inspection:          Left foot: normal          Right foot: normal       Nails:          Left foot: normal          Right foot: normal    Eye Exam:       Eye Exam done elsewhere           Date: 06/21/2009          Results: normal          Done by: Dr Cheryll Cockayne   Impression & Recommendations:  Problem # 1:  DIABETES MELLITUS, TYPE II (ICD-250.00) Assessment Unchanged  sugar control is stable / adequate next goal is wt loss enc walking  opthy utd and foot care is good  lab and f/u 6 mo  Her updated medication list for this problem includes:    Cozaar 100 Mg Tabs (Losartan potassium) .Marland Kitchen... 1 by mouth once daily    Aspirin 81 Mg Tbec (Aspirin) .Marland Kitchen... 1 daily    Metformin Hcl 500 Mg Tabs (Metformin hcl) .Marland Kitchen... 1 by mouth two times a day with breakfast and evening meal  Labs Reviewed: Creat: 0.5 (10/22/2009)     Last Eye Exam: normal (06/28/2008) Reviewed HgBA1c results: 6.3 (10/22/2009)  6.1 (04/23/2009)  Problem # 2:  HYPERLIPIDEMIA (ICD-272.4) Assessment: Unchanged  will keep working on low fat and low sugar diet  continue current meds  exercise should help LDL is to goal - rev labs with pt  lab and f/u 6 mo  Her updated medication list for this problem includes:    Tricor 145 Mg Tabs (Fenofibrate) .Marland Kitchen... 1 by mouth once daily    Zocor 40 Mg Tabs (Simvastatin) .Marland Kitchen... 1 by mouth once daily  Labs Reviewed: SGOT: 26 (10/22/2009)   SGPT: 25 (10/22/2009)  Lipid Goals: Chol Goal: 200 (04/30/2009)   HDL Goal: 40 (04/30/2009)   LDL Goal: 100 (04/30/2009)   TG Goal: 150 (04/30/2009)  Prior 10 Yr Risk Heart Disease: Not enough information (04/30/2009)   HDL:38.60 (10/22/2009), 37.20 (04/23/2009)  LDL:DEL (03/31/2008)  Chol:118 (10/22/2009), 135 (04/23/2009)  Trig:242.0 (10/22/2009), 211.0 (04/23/2009)  Problem # 3:  HYPERTENSION, ESSENTIAL NOS (ICD-401.9) Assessment: Unchanged  bp is up today - but forgot med this am  at home is well controlled will alert me if above 130/80 lab and f/u 6 mo  pt isvery compliant with med and lifestyle Her updated medication list for this problem includes:    Cozaar 100 Mg Tabs (Losartan  potassium) .Marland Kitchen... 1 by mouth once daily  BP  today: 152/84 Prior BP: 120/70 (04/30/2009)  Prior 10 Yr Risk Heart Disease: Not enough information (04/30/2009)  Labs Reviewed: K+: 3.6 (10/22/2009) Creat: : 0.5 (10/22/2009)   Chol: 118 (10/22/2009)   HDL: 38.60 (10/22/2009)   LDL: DEL (03/31/2008)   TG: 242.0 (10/22/2009)  Complete Medication List: 1)  Tricor 145 Mg Tabs (Fenofibrate) .Marland Kitchen.. 1 by mouth once daily 2)  Zocor 40 Mg Tabs (Simvastatin) .Marland Kitchen.. 1 by mouth once daily 3)  Amitriptyline Hcl 25 Mg Tabs (Amitriptyline hcl) .Marland Kitchen.. 1 by mouth at bedtime 4)  Cozaar 100 Mg Tabs (Losartan potassium) .Marland Kitchen.. 1 by mouth once daily 5)  Prilosec 20 Mg Cpdr (Omeprazole) .Marland Kitchen.. 1 by mouth once daily 6)  Loratadine 10 Mg Tabs (Loratadine) .Marland Kitchen.. 1 daily 7)  Aspirin 81 Mg Tbec (Aspirin) .Marland Kitchen.. 1 daily 8)  Meclizine Hcl 25 Mg Tabs (Meclizine hcl) .... Take one by mouth three times a day as needed 9)  Omega-3 Fish Oil 1000 Mg Caps (Omega-3 fatty acids) .... One by mouth two times a day 10)  Metformin Hcl 500 Mg Tabs (Metformin hcl) .Marland Kitchen.. 1 by mouth two times a day with breakfast and evening meal 11)  Centrum Silver Tabs (Multiple vitamins-minerals) .... Take 1 tablet by mouth once a day 12)  Vitamin D 1000 Unit Tabs (Cholecalciferol) .... One daily 13)  Accu-chek Compact Strp (Glucose blood) .... To check sugar two times a day for dm 2 250.00 14)  Accu-chek Softclix Lancets Misc (Lancets) .... To check sugar two times a day for dm 2 250.00  Patient Instructions: 1)  keep working on healthy diet and exercise  2)  no change in medicines  3)  keep wallking  4)  keep check of your blood pressure at home -- alert me if it consistently goes above 130/80 5)  schedule fasting labs in 6 months lipid/ast/ alt/ renal / AIC and microalb and then follow up for check up   Current Allergies (reviewed today): ! LIPITOR ! NIACIN ! Lanetta Inch

## 2010-08-15 NOTE — Consult Note (Signed)
Summary: [facility]   Imported By: Lanelle Bal 02/13/2010 08:41:54  _____________________________________________________________________  External Attachment:    Type:   Image     Comment:   External Document

## 2010-08-15 NOTE — Letter (Signed)
Summary: Results Follow up Letter  [facility]  [address] [place], [place] [address]   Phone: [phone]  Fax: [phone]    03/15/2010 MRN: [id number]    [name] [address] [place], [place]  [address]    Dear Ms. Mack Guise,  The following are the results of your recent test(s):  Test         Result    Pap Smear:        Normal _____  Not Normal _____ Comments: ______________________________________________________ Cholesterol: LDL(Bad cholesterol):         Your goal is less than:         HDL (Good cholesterol):       Your goal is more than: Comments:  ______________________________________________________ Mammogram:        Normal _X____  Not Normal _____ Comments:Repeat in one year.   ___________________________________________________________________ Hemoccult:        Normal _____  Not normal _______ Comments:    _____________________________________________________________________ Other Tests:    We routinely do not discuss normal results over the telephone.  If you desire a copy of the results, or you have any questions about this information we can discuss them at your next office visit.   Sincerely,    Idamae Schuller Tower,MD  MT/ri

## 2010-08-15 NOTE — Progress Notes (Signed)
Summary: Referral to arthritis MD  Phone Note Call from Patient Call back at Home Phone [phone]   Caller: Patient Call For: Judith Part MD Summary of Call: Patient says her arthritis in her hands and fingers is getting really bad and she was going to go to see her arthritis MD but when she called for an appointment, they told her she had not been there in 3 years and would be considered a NP and that she would need a referral from her PCP.  She says her MD retired but he was located at [facility].  She says her fingers and hands are swelling and stiff to the point that she can hardly hold a pen any longer.  Will you do the referral>? Initial call taken by: Delilah Shan CMA Duncan Dull),  January 07, 2010 4:58 PM  Follow-up for Phone Call        was she seeing rheumatology or orthopedics?  Follow-up by: Judith Part MD,  January 07, 2010 5:07 PM  Additional Follow-up for Phone Call Additional follow up Details #1::        Pt would like to see Dr Dierdre Forth a rheumatlogist. Pt can go any day or time. Pt can be reached at [phone]. Pt will wait to hear from [facility] re appt. thank you.Lewanda Rife LPN  January 07, 2010 5:18 PM   referral done  Additional Follow-up by: Judith Part MD,  January 07, 2010 5:23 PM

## 2010-09-14 ENCOUNTER — Encounter: Payer: Self-pay | Admitting: Family Medicine

## 2010-10-06 ENCOUNTER — Other Ambulatory Visit: Payer: Self-pay | Admitting: Family Medicine

## 2010-10-07 ENCOUNTER — Other Ambulatory Visit: Payer: Self-pay | Admitting: *Deleted

## 2010-10-07 MED ORDER — AMITRIPTYLINE HCL 25 MG PO TABS: 25.0000 mg | ORAL_TABLET | Freq: Every day | ORAL | Status: DC

## 2010-10-07 NOTE — Telephone Encounter (Signed)
Here is px to call in

## 2010-10-07 NOTE — Telephone Encounter (Signed)
Medication phoned to [facility] pharmacy as instructed. Patient notified as instructed by telephone.

## 2010-10-16 ENCOUNTER — Other Ambulatory Visit: Payer: Self-pay | Admitting: Family Medicine

## 2010-10-16 DIAGNOSIS — E7800 Pure hypercholesterolemia, unspecified: Secondary | ICD-10-CM

## 2010-10-16 DIAGNOSIS — Z0000 Encounter for general adult medical examination without abnormal findings: Secondary | ICD-10-CM

## 2010-10-16 DIAGNOSIS — I10 Essential (primary) hypertension: Secondary | ICD-10-CM

## 2010-10-21 ENCOUNTER — Other Ambulatory Visit (INDEPENDENT_AMBULATORY_CARE_PROVIDER_SITE_OTHER): Payer: BC Managed Care – PPO | Admitting: Family Medicine

## 2010-10-21 DIAGNOSIS — I10 Essential (primary) hypertension: Secondary | ICD-10-CM

## 2010-10-21 DIAGNOSIS — E7800 Pure hypercholesterolemia, unspecified: Secondary | ICD-10-CM

## 2010-10-21 DIAGNOSIS — E119 Type 2 diabetes mellitus without complications: Secondary | ICD-10-CM

## 2010-10-21 DIAGNOSIS — Z0000 Encounter for general adult medical examination without abnormal findings: Secondary | ICD-10-CM

## 2010-10-21 DIAGNOSIS — E785 Hyperlipidemia, unspecified: Secondary | ICD-10-CM

## 2010-10-21 LAB — CBC WITH DIFFERENTIAL/PLATELET
Basophils Absolute: 0.1 10*3/uL (ref 0.0–0.1)
Basophils Relative: 1.8 % (ref 0.0–3.0)
Eosinophils Absolute: 0.2 10*3/uL (ref 0.0–0.7)
Lymphocytes Relative: 36.2 % (ref 12.0–46.0)
MCHC: 34.1 g/dL (ref 30.0–36.0)
Monocytes Relative: 6.5 % (ref 3.0–12.0)
Neutrophils Relative %: 51.8 % (ref 43.0–77.0)
RBC: 4.05 Mil/uL (ref 3.87–5.11)
RDW: 12.7 % (ref 11.5–14.6)

## 2010-10-21 LAB — BASIC METABOLIC PANEL
BUN: 15 mg/dL (ref 6–23)
Chloride: 101 mEq/L (ref 96–112)
GFR: 115.09 mL/min (ref >60.00)
Potassium: 4.2 mEq/L (ref 3.5–5.1)
Sodium: 139 mEq/L (ref 135–145)

## 2010-10-21 LAB — HEPATIC FUNCTION PANEL
AST: 26 U/L (ref 0–37)
Bilirubin, Direct: 0.1 mg/dL (ref 0.0–0.3)
Total Bilirubin: 0.4 mg/dL (ref 0.3–1.2)

## 2010-10-21 LAB — TSH: TSH: 1.95 u[IU]/mL (ref 0.35–5.50)

## 2010-10-21 LAB — LIPID PANEL
HDL: 39.1 mg/dL (ref >39.00)
Total CHOL/HDL Ratio: 3
VLDL: 40.2 mg/dL — ABNORMAL HIGH (ref 0.0–40.0)

## 2010-10-21 LAB — LDL CHOLESTEROL, DIRECT: Direct LDL: 58.5 mg/dL

## 2010-10-28 ENCOUNTER — Ambulatory Visit (INDEPENDENT_AMBULATORY_CARE_PROVIDER_SITE_OTHER): Payer: BC Managed Care – PPO | Admitting: Family Medicine

## 2010-10-28 ENCOUNTER — Encounter: Payer: Self-pay | Admitting: Family Medicine

## 2010-10-28 DIAGNOSIS — I10 Essential (primary) hypertension: Secondary | ICD-10-CM

## 2010-10-28 DIAGNOSIS — E119 Type 2 diabetes mellitus without complications: Secondary | ICD-10-CM

## 2010-10-28 DIAGNOSIS — E785 Hyperlipidemia, unspecified: Secondary | ICD-10-CM

## 2010-10-28 NOTE — Progress Notes (Signed)
Subjective:    Patient ID: Martha Hamilton, female    DOB: [birth year], 60 y.o.   MRN: [id number]  HPI Here for follow up of lipids and HTN and DM 2 Has been doing well overall  Her arthritis has acted up a lot  Has a cyst on R last toe -- has had shots put on it -- may need surgery   Lipids are stable on zocor and tricor currently with no problems Really watching diet-- closely for sat fats  Lab Results  Component Value Date   CHOL 123 10/21/2010   CHOL 127 04/29/2010   CHOL 118 10/22/2009   Lab Results  Component Value Date   HDL 39.10 10/21/2010   HDL 39.20 04/29/2010   HDL 38.60* 10/22/2009   Lab Results  Component Value Date   LDLCALC 48 04/29/2010   Lab Results  Component Value Date   TRIG 201.0* 10/21/2010   TRIG 198.0* 04/29/2010   TRIG 242.0* 10/22/2009   Lab Results  Component Value Date   CHOLHDL 3 10/21/2010   CHOLHDL 3 04/29/2010   CHOLHDL 3 10/22/2009   HTN - first bp today is 146/84- better on 2nd check  No headaches On cozaar  Wt i sdown 3 lb   Exercise--still using the wii Also she is on her feet caring for her mother -- this is hard   DM is stable with metformin AIC 6.4 opthy- dec- Dr Oren Bracket  Pneumovax was 09  Low sugar diet   Wants shingles vaccine when she turns 60  This is upcoming - will check with her insurance co   Past Medical History  Diagnosis Date  . Breast cancer   . Allergic rhinitis   . DM2 (diabetes mellitus, type 2)   . Hypertension   . Osteoarthritis     hands  . Obesity    No past surgical history on file.  History   Social History  . Marital Status: Single    Spouse Name: N/A    Number of Children: N/A  . Years of Education: N/A   Occupational History  . Not on file.   Social History Main Topics  . Smoking status: Never Smoker   . Smokeless tobacco: Not on file  . Alcohol Use: No  . Drug Use: Not on file  . Sexually Active: Not on file   Other Topics Concern  . Not on file   Social History Narrative   Lives  with, and cares for her elderly mother.    Family History  Problem Relation Age of Onset  . Diabetes Mother   . Stroke Father         Review of Systems  Constitutional: Negative for appetite change, fatigue and unexpected weight change.  HENT: Positive for postnasal drip.   Eyes: Negative for pain and visual disturbance.  Respiratory: Negative for cough and shortness of breath.   Cardiovascular: Negative for chest pain and leg swelling.  Gastrointestinal: Negative for nausea, diarrhea and constipation.  Genitourinary: Negative for frequency.  Musculoskeletal: Negative for myalgias and joint swelling.  Skin: Negative for color change and rash.  Neurological: Negative for tremors, weakness, numbness and headaches.  Hematological: Negative for adenopathy. Does not bruise/bleed easily.  Psychiatric/Behavioral: Negative for dysphoric mood. The patient is not nervous/anxious.        Objective:   Physical Exam  Constitutional: She appears well-developed and well-nourished.       overwt and well appearing   HENT:  Head: Normocephalic and atraumatic.  Eyes: Conjunctivae and EOM are normal. Pupils are equal, round, and reactive to light.  Neck: Normal range of motion. Neck supple. No JVD present. Carotid bruit is not present. No thyromegaly present.  Cardiovascular: Normal rate, regular rhythm and normal heart sounds.   Pulmonary/Chest: Effort normal and breath sounds normal. She has no wheezes.  Abdominal: Soft. Bowel sounds are normal. She exhibits no abdominal bruit. There is no tenderness.  Musculoskeletal: Normal range of motion. She exhibits no edema and no tenderness.       OA in hands and feet   Lymphadenopathy:    She has no cervical adenopathy.  Neurological: She has normal reflexes. She displays normal reflexes. Coordination normal.  Skin: Skin is warm. No pallor.  Psychiatric: She has a normal mood and affect.          Assessment & Plan:

## 2010-10-28 NOTE — Assessment & Plan Note (Signed)
Doing well with stable AIC and good diet  Foot care excellent with reg pod visits  opthy up to date imms up to date No problems with metformin  Enc to continue exercise

## 2010-10-28 NOTE — Patient Instructions (Signed)
Keep up the good work with diet and exercise No change in medicine Call us about the shingles shot when you are ready  Follow up in 6 months for 30 minute PE with labs prior

## 2010-10-28 NOTE — Assessment & Plan Note (Signed)
This is better on 2nd check today No change in ARB Disc exercise and wt loss F/u 6 mo for check up

## 2010-10-28 NOTE — Assessment & Plan Note (Signed)
Fairly well controlled with tricor and zocor- watching closely for myalgias  No problems  Rev labs with pt - stable  Keeping trig under 200 for the most part Rev low sat fat diet

## 2010-11-18 ENCOUNTER — Ambulatory Visit (INDEPENDENT_AMBULATORY_CARE_PROVIDER_SITE_OTHER): Payer: BC Managed Care – PPO | Admitting: Family Medicine

## 2010-11-18 ENCOUNTER — Encounter: Payer: Self-pay | Admitting: Family Medicine

## 2010-11-18 ENCOUNTER — Ambulatory Visit (INDEPENDENT_AMBULATORY_CARE_PROVIDER_SITE_OTHER)
Admission: RE | Admit: 2010-11-18 | Discharge: 2010-11-18 | Disposition: A | Discharge status: Home / Self Care | Payer: BC Managed Care – PPO | Source: Ambulatory Visit | Attending: Family Medicine | Admitting: Family Medicine

## 2010-11-18 VITALS — BP 142/84 | HR 96 | Temp 98.3°F | Ht 59.0 in | Wt 160.1 lb

## 2010-11-18 DIAGNOSIS — M25519 Pain in unspecified shoulder: Secondary | ICD-10-CM

## 2010-11-18 DIAGNOSIS — M25511 Pain in right shoulder: Secondary | ICD-10-CM | POA: Insufficient documentation

## 2010-11-18 MED ORDER — NAPROXEN 500 MG PO TABS: ORAL_TABLET | ORAL | Status: DC

## 2010-11-18 NOTE — Patient Instructions (Addendum)
Sounds like rotator cuff inflammation. Recommend naprosyn twice daily (hold ibuprofen) with food for 1 wk then as needed. I would like to refer you to physical therapy for 4-6 wks.  Pass by Marion's office to set this up. If worsening, return sooner.  If not improving as expected, let us know.

## 2010-11-18 NOTE — Assessment & Plan Note (Addendum)
Xray - r/o dislocation. Likely RTC tendinopathy after sudden movement.  Doubt full tear as able to still support testing against resistance (no weakness). Recommend NSAIDs (script sent in for 1 wk scheduled then PRN in h/o GERD) and PT. In h/o DM, at risk for frozen shoulder.  Encourage continued motion.  Stretching exercises.

## 2010-11-18 NOTE — Progress Notes (Signed)
  Subjective:    Patient ID: Martha Hamilton, female    DOB: Jan 18, 1951, 60 y.o.   MRN: [id number]  HPI CC: R shoulder pain  R handed.  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  May have pulled muscle when reaching for her.  Doesn't seem to be getting better.  Pain not improving.  Has tried ibuprofen as well as voltaren gel (which helps pain some).  No icing of shoulder yet (not supposed to use cool or hot on R arm 2/2 h/o BRCA).  Worse pain at night.  Main pain with lifting arm.  Pain radiates from elbow to R shoulder.  Feels like shoulder needs to "pop", seems to catch.  Never injured R shoulder in past.  H/o BRCA, s/p 13 LN removed R axilla.  H/o DM, HLD, HTN.  H/o arthritis in past, fingers back and heels.  Review of Systems Per HPI    Objective:   Physical Exam  [nursing notereviewed. Constitutional: She appears well-developed and well-nourished. No distress.  Musculoskeletal:       Right shoulder: She exhibits decreased range of motion, tenderness, deformity (mild anterior positioning of humeral head) and pain. She exhibits no bony tenderness, no swelling, no effusion, no spasm, normal pulse and normal strength.       Left shoulder: Normal.       R shoulder - limited active ROM 2/2 pain.  Ok passive ROM with some catching.  Tender to palpation along anterior humerus.  No point tenderness.  Good strength with testing in int/ext rotation.  Pain with empty can sign but no weakness, pain with seer's test.          Assessment & Plan:

## 2010-12-19 ENCOUNTER — Telehealth: Payer: Self-pay | Admitting: *Deleted

## 2010-12-19 NOTE — Telephone Encounter (Signed)
Prior auth is needed for omeprazole, form is on your shelf. 

## 2010-12-20 NOTE — Telephone Encounter (Signed)
Thanks -- form done in IN box Please make sure it gets scanned too

## 2010-12-20 NOTE — Telephone Encounter (Signed)
I do not have a lot in her chart about GERD-- would you please ask her the questions on the sheet  ? Severe/ atypical GERd or on nsaid or peptic ulcer dz Thanks- I put form in IN box

## 2010-12-20 NOTE — Telephone Encounter (Signed)
Completed form faxed to [phone]. Sent to be scanned.

## 2010-12-20 NOTE — Telephone Encounter (Signed)
Patient's daughter notified as instructed by telephone. Ruby said no to sever or atypical GERD,No peptic ulcer dz, pt Used to be on nsaid but no longer on med and pt had bleeding ulcer 1983, I put the form back on your shelf. Thank you.

## 2010-12-21 ENCOUNTER — Telehealth: Payer: Self-pay | Admitting: *Deleted

## 2010-12-21 NOTE — Telephone Encounter (Signed)
Prior auth given for omeprazole, approval letter placed on doctor's desk for signature and scanning.

## 2011-01-28 ENCOUNTER — Ambulatory Visit (INDEPENDENT_AMBULATORY_CARE_PROVIDER_SITE_OTHER): Payer: BC Managed Care – PPO | Admitting: Family Medicine

## 2011-01-28 DIAGNOSIS — Z2911 Encounter for prophylactic immunotherapy for respiratory syncytial virus (RSV): Secondary | ICD-10-CM

## 2011-01-28 DIAGNOSIS — Z23 Encounter for immunization: Secondary | ICD-10-CM

## 2011-01-28 NOTE — Progress Notes (Signed)
Zostavax given to patient during nurse visit today.

## 2011-02-04 ENCOUNTER — Other Ambulatory Visit: Payer: Self-pay | Admitting: Family Medicine

## 2011-02-04 DIAGNOSIS — Z1231 Encounter for screening mammogram for malignant neoplasm of breast: Secondary | ICD-10-CM

## 2011-03-15 LAB — HM MAMMOGRAPHY: HM Mammogram: NORMAL

## 2011-03-18 ENCOUNTER — Ambulatory Visit
Admission: RE | Admit: 2011-03-18 | Discharge: 2011-03-18 | Disposition: A | Discharge status: Home / Self Care | Payer: BC Managed Care – PPO | Source: Ambulatory Visit | Attending: Family Medicine | Admitting: Family Medicine

## 2011-03-18 DIAGNOSIS — Z1231 Encounter for screening mammogram for malignant neoplasm of breast: Secondary | ICD-10-CM

## 2011-03-25 ENCOUNTER — Encounter: Payer: Self-pay | Admitting: *Deleted

## 2011-04-09 ENCOUNTER — Ambulatory Visit (INDEPENDENT_AMBULATORY_CARE_PROVIDER_SITE_OTHER): Payer: BC Managed Care – PPO

## 2011-04-09 DIAGNOSIS — Z23 Encounter for immunization: Secondary | ICD-10-CM

## 2011-04-20 ENCOUNTER — Telehealth: Payer: Self-pay | Admitting: Family Medicine

## 2011-04-20 DIAGNOSIS — E119 Type 2 diabetes mellitus without complications: Secondary | ICD-10-CM

## 2011-04-20 DIAGNOSIS — I10 Essential (primary) hypertension: Secondary | ICD-10-CM

## 2011-04-20 DIAGNOSIS — Z0000 Encounter for general adult medical examination without abnormal findings: Secondary | ICD-10-CM | POA: Insufficient documentation

## 2011-04-20 DIAGNOSIS — E785 Hyperlipidemia, unspecified: Secondary | ICD-10-CM

## 2011-04-20 NOTE — Telephone Encounter (Signed)
Message copied by [name] on Sun Apr 20, 2011  1:30 PM ------      Message from: Baldomero Lamy      Created: Fri Apr 18, 2011 10:14 AM      Regarding: Cpx labs Wed 10/10       Please order  future cpx labs for pt's upcomming lab appt.      Thanks      Rodney Booze

## 2011-04-23 ENCOUNTER — Other Ambulatory Visit (INDEPENDENT_AMBULATORY_CARE_PROVIDER_SITE_OTHER): Payer: BC Managed Care – PPO

## 2011-04-23 DIAGNOSIS — E785 Hyperlipidemia, unspecified: Secondary | ICD-10-CM

## 2011-04-23 DIAGNOSIS — Z0000 Encounter for general adult medical examination without abnormal findings: Secondary | ICD-10-CM

## 2011-04-23 DIAGNOSIS — I10 Essential (primary) hypertension: Secondary | ICD-10-CM

## 2011-04-23 DIAGNOSIS — E119 Type 2 diabetes mellitus without complications: Secondary | ICD-10-CM

## 2011-04-23 LAB — COMPREHENSIVE METABOLIC PANEL
AST: 29 U/L (ref 0–37)
Albumin: 4.2 g/dL (ref 3.5–5.2)
Alkaline Phosphatase: 36 U/L — ABNORMAL LOW (ref 39–117)
BUN: 12 mg/dL (ref 6–23)
Creatinine, Ser: 0.5 mg/dL (ref 0.4–1.2)
Glucose, Bld: 122 mg/dL — ABNORMAL HIGH (ref 70–99)
Potassium: 3.8 mEq/L (ref 3.5–5.1)
Total Bilirubin: 0.2 mg/dL — ABNORMAL LOW (ref 0.3–1.2)

## 2011-04-23 LAB — TSH: TSH: 1.49 u[IU]/mL (ref 0.35–5.50)

## 2011-04-23 LAB — CBC WITH DIFFERENTIAL/PLATELET
Basophils Relative: 1.3 % (ref 0.0–3.0)
Eosinophils Absolute: 0.2 10*3/uL (ref 0.0–0.7)
HCT: 38.3 % (ref 36.0–46.0)
Lymphs Abs: 2.3 10*3/uL (ref 0.7–4.0)
MCHC: 32.9 g/dL (ref 30.0–36.0)
MCV: 90 fl (ref 78.0–100.0)
Monocytes Absolute: 0.5 10*3/uL (ref 0.1–1.0)
Neutrophils Relative %: 49.3 % (ref 43.0–77.0)
Platelets: 381 10*3/uL (ref 150.0–400.0)
RBC: 4.26 Mil/uL (ref 3.87–5.11)

## 2011-04-23 LAB — HEMOGLOBIN A1C: Hgb A1c MFr Bld: 6.6 % — ABNORMAL HIGH (ref 4.6–6.5)

## 2011-04-23 LAB — LIPID PANEL
Cholesterol: 129 mg/dL (ref 0–200)
Total CHOL/HDL Ratio: 3
Triglycerides: 272 mg/dL — ABNORMAL HIGH (ref 0.0–149.0)
VLDL: 54.4 mg/dL — ABNORMAL HIGH (ref 0.0–40.0)

## 2011-04-29 ENCOUNTER — Encounter: Payer: Self-pay | Admitting: Family Medicine

## 2011-04-29 ENCOUNTER — Other Ambulatory Visit: Payer: Self-pay | Admitting: Family Medicine

## 2011-04-29 ENCOUNTER — Ambulatory Visit (INDEPENDENT_AMBULATORY_CARE_PROVIDER_SITE_OTHER): Payer: BC Managed Care – PPO | Admitting: Family Medicine

## 2011-04-29 ENCOUNTER — Other Ambulatory Visit (HOSPITAL_COMMUNITY)
Admission: RE | Admit: 2011-04-29 | Discharge: 2011-04-29 | Disposition: A | Discharge status: Home / Self Care | Payer: BC Managed Care – PPO | Source: Ambulatory Visit | Attending: Family Medicine | Admitting: Family Medicine

## 2011-04-29 VITALS — BP 140/80 | HR 104 | Temp 98.8°F | Ht 59.5 in | Wt 164.0 lb

## 2011-04-29 DIAGNOSIS — E119 Type 2 diabetes mellitus without complications: Secondary | ICD-10-CM

## 2011-04-29 DIAGNOSIS — Z1159 Encounter for screening for other viral diseases: Secondary | ICD-10-CM | POA: Insufficient documentation

## 2011-04-29 DIAGNOSIS — Z0000 Encounter for general adult medical examination without abnormal findings: Secondary | ICD-10-CM

## 2011-04-29 DIAGNOSIS — Z01419 Encounter for gynecological examination (general) (routine) without abnormal findings: Secondary | ICD-10-CM | POA: Insufficient documentation

## 2011-04-29 DIAGNOSIS — I10 Essential (primary) hypertension: Secondary | ICD-10-CM

## 2011-04-29 DIAGNOSIS — E785 Hyperlipidemia, unspecified: Secondary | ICD-10-CM

## 2011-04-29 DIAGNOSIS — Z1211 Encounter for screening for malignant neoplasm of colon: Secondary | ICD-10-CM

## 2011-04-29 NOTE — Assessment & Plan Note (Signed)
Pt will do IFOB Declines colonoscopy at this time

## 2011-04-29 NOTE — Assessment & Plan Note (Signed)
Exam done this year with a pap- had been a while No complaints  Nl breast exam and mam with hx of cancer in past

## 2011-04-29 NOTE — Progress Notes (Signed)
Subjective:    Patient ID: Martha Hamilton, female    DOB: [birth year], 60 y.o.   MRN: [id number]  HPI Here for annual health mt exam and to review chronic medical problems Is feeling good - nothing new medically    Wt is up 4 lb with bmi of 32 Also up by her scale  Was away from exercise for a while with rotator cuff problems - is better now - getting back to normal  Diet is good   HTN bp is 148/72- a little higher than usual No cp or ha Was rushing   Pap last - has been years ago  Not since retirement  Is ok with that today   Colon cancer screen Had one colonoscopy in the past -- before her stomach surgery  Not interested in one yet --maybe later  Will do a stool card   F/u shot utd Td up to date 2010 Pneumovax 09 Had zoster vaccine this summer   Hx of breast cancer  Mam 9/12 normal  Self exam - no lumps or findings on self exam No longer has specialist f/u for this (after the 15 year mark)  DM a1c is up to 6.6 from 6.4- still fairly controlled Because of wt gain and lack of exercise  Now is up to walking again  On metformin - still doing well on  opthy- is due in dec  Lipids are controlled with zocor , though trig are up Lab Results  Component Value Date   CHOL 129 04/23/2011   CHOL 123 10/21/2010   CHOL 127 04/29/2010   Lab Results  Component Value Date   HDL 44.80 04/23/2011   HDL 39.10 10/21/2010   HDL 39.20 04/29/2010   Lab Results  Component Value Date   LDLCALC 48 04/29/2010   Lab Results  Component Value Date   TRIG 272.0* 04/23/2011   TRIG 201.0* 10/21/2010   TRIG 198.0* 04/29/2010   Lab Results  Component Value Date   CHOLHDL 3 04/23/2011   CHOLHDL 3 10/21/2010   CHOLHDL 3 04/29/2010   Lab Results  Component Value Date   LDLDIRECT 62.6 04/23/2011   LDLDIRECT 58.5 10/21/2010   LDLDIRECT 57.7 10/22/2009     On zocor and tricor and good diet   Patient Active Problem List  Diagnoses  . DIABETES MELLITUS, TYPE II  . HYPERLIPIDEMIA  .  HYPERTENSION, ESSENTIAL NOS  . ALLERGIC RHINITIS  . INTERTRIGO, CANDIDAL  . OSTEOARTHRITIS, HANDS, BILATERAL  . BACK PAIN  . Right shoulder pain  . Routine general medical examination at a health care facility  . Special screening for malignant neoplasms, colon  . Gynecological examination   Past Medical History  Diagnosis Date  . Breast cancer   . Allergic rhinitis   . DM2 (diabetes mellitus, type 2)   . Hypertension   . Osteoarthritis     hands  . Obesity    No past surgical history on file. History  Substance Use Topics  . Smoking status: Never Smoker   . Smokeless tobacco: Not on file  . Alcohol Use: No   Family History  Problem Relation Age of Onset  . Diabetes Mother   . Stroke Father    Allergies  Allergen Reactions  . Atorvastatin     REACTION: UNKNOWN  . Cholestyramine     REACTION: unknown  . Niacin     REACTION: UNKNOWN   Current Outpatient Prescriptions on File Prior to Visit  Medication Sig Dispense Refill  .  ACCU-CHEK COMPACT TEST DRUM test strip USE TO TEST SUGAR TWICE A DAY FOR DIABETES      MELLITUS  90 each  3  . ACCU-CHEK SOFTCLIX LANCETS lancets CHECK SUGAR TWICE A DAY FOR DIABETES MELLITUS  90 each  3  . amitriptyline (ELAVIL) 25 MG tablet Take 1 tablet (25 mg total) by mouth at bedtime.  10 tablet  0  . aspirin 81 MG tablet Take 81 mg by mouth daily.        . fenofibrate (TRICOR) 145 MG tablet Take 145 mg by mouth daily.       Marland Kitchen losartan (COZAAR) 100 MG tablet Take 100 mg by mouth daily.        . metFORMIN (GLUCOPHAGE) 500 MG tablet Take 500 mg by mouth 2 (two) times daily with a meal.        . Omega-3 Fatty Acids (FISH OIL) 1000 MG CAPS Take by mouth 2 (two) times daily.        Marland Kitchen omeprazole (PRILOSEC) 20 MG capsule Take 20 mg by mouth daily.        . Cholecalciferol (VITAMIN D) 1000 UNITS capsule Take 2,000 Units by mouth daily.       Marland Kitchen desonide (DESOWEN) 0.05 % lotion Apply small amount to affected area once daily as needed- for recurrent  rash under breast.       . diclofenac sodium (VOLTAREN) 1 % GEL Apply topically as needed.        Marland Kitchen ibuprofen (ADVIL,MOTRIN) 200 MG tablet Take 200 mg by mouth as directed.        . Multiple Vitamins-Minerals (CENTRUM SILVER PO) Take by mouth daily.        . naproxen (NAPROSYN) 500 MG tablet Take one po bid x 1 week then prn pain, take with food  60 tablet  0     Review of Systems Review of Systems  Constitutional: Negative for fever, appetite change, fatigue and unexpected weight change.  Eyes: Negative for pain and visual disturbance.  Respiratory: Negative for cough and shortness of breath.   Cardiovascular: Negative for cp or palpitations    Gastrointestinal: Negative for nausea, diarrhea and constipation.  Genitourinary: Negative for urgency and frequency.  Skin: Negative for pallor or rash   Neurological: Negative for weakness, light-headedness, numbness and headaches.  Hematological: Negative for adenopathy. Does not bruise/bleed easily.  Psychiatric/Behavioral: Negative for dysphoric mood. The patient is not nervous/anxious.  does have some stress caring for her mother         Objective:   Physical Exam  Constitutional: She appears well-developed and well-nourished. No distress.       overwt and well appearing   HENT:  Head: Normocephalic and atraumatic.  Mouth/Throat: Oropharynx is clear and moist.  Eyes: Conjunctivae and EOM are normal. Pupils are equal, round, and reactive to light.  Neck: Normal range of motion. Neck supple. No JVD present. Carotid bruit is not present. No thyromegaly present.  Cardiovascular: Normal rate, regular rhythm, normal heart sounds and intact distal pulses.  Exam reveals no gallop.   No murmur heard. Pulmonary/Chest: Effort normal and breath sounds normal. No respiratory distress. She has no wheezes.  Abdominal: Soft. Bowel sounds are normal. She exhibits no distension and no abdominal bruit. There is no tenderness.  Genitourinary: Vagina  normal and uterus normal. No breast swelling, tenderness, discharge or bleeding. No vaginal discharge found.       R breast with lumpectomy scar No M , skin change or nipple  d/c  Musculoskeletal: Normal range of motion. She exhibits no edema and no tenderness.  Lymphadenopathy:    She has no cervical adenopathy.  Neurological: She is alert. She has normal reflexes. No cranial nerve deficit. She exhibits normal muscle tone. Coordination normal.  Skin: Skin is warm and dry. No rash noted. No erythema. No pallor.  Psychiatric: She has a normal mood and affect.          Assessment & Plan:

## 2011-04-29 NOTE — Assessment & Plan Note (Addendum)
Well controlled with zocor 40 and tricor - at this dose for a while without problems Trig up - likely due to inc glucose Rev lab with pt  Rev low sat fat diet  Re check 6 mo and f/u

## 2011-04-29 NOTE — Assessment & Plan Note (Signed)
a1c is up a bit - likely from change in exercise and 4 lb wt gain - but still in good control Will continue metformin  Back to her regular habits now utd opthy  F/u 6 months

## 2011-04-29 NOTE — Assessment & Plan Note (Signed)
Fair control  140/80 on 2nd check  Pt was nervous and rushed today  No change in meds On arb

## 2011-04-29 NOTE — Assessment & Plan Note (Signed)
Reviewed health habits including diet and exercise and skin cancer prevention Also reviewed health mt list, fam hx and immunizations  Rev wellness labs in detail 

## 2011-04-29 NOTE — Patient Instructions (Addendum)
Please do stool card for colon cancer screening  Blood pressure was improved on the 2nd check  Don't forget your eye exam is due in December  Schedule follow up in 6 months with labs prior

## 2011-04-30 ENCOUNTER — Other Ambulatory Visit: Payer: Self-pay | Admitting: Family Medicine

## 2011-05-01 ENCOUNTER — Other Ambulatory Visit: Payer: Self-pay | Admitting: Family Medicine

## 2011-05-02 ENCOUNTER — Other Ambulatory Visit: Payer: BC Managed Care – PPO

## 2011-05-02 ENCOUNTER — Other Ambulatory Visit: Payer: Self-pay | Admitting: Family Medicine

## 2011-05-02 DIAGNOSIS — Z1211 Encounter for screening for malignant neoplasm of colon: Secondary | ICD-10-CM

## 2011-05-06 ENCOUNTER — Encounter: Payer: Self-pay | Admitting: *Deleted

## 2011-05-06 ENCOUNTER — Other Ambulatory Visit: Payer: Self-pay | Admitting: Family Medicine

## 2011-05-06 NOTE — Telephone Encounter (Signed)
[facility] request refill Tricor 145 mg.#90 x 2.

## 2011-05-08 ENCOUNTER — Encounter: Payer: Self-pay | Admitting: *Deleted

## 2011-06-10 ENCOUNTER — Other Ambulatory Visit: Payer: Self-pay | Admitting: Family Medicine

## 2011-06-11 ENCOUNTER — Other Ambulatory Visit: Payer: Self-pay | Admitting: Family Medicine

## 2011-06-11 NOTE — Telephone Encounter (Signed)
OK to refill

## 2011-06-11 NOTE — Telephone Encounter (Signed)
Will refill electronically  

## 2011-06-23 ENCOUNTER — Ambulatory Visit (INDEPENDENT_AMBULATORY_CARE_PROVIDER_SITE_OTHER): Payer: BC Managed Care – PPO | Admitting: Family Medicine

## 2011-06-23 ENCOUNTER — Encounter: Payer: Self-pay | Admitting: Family Medicine

## 2011-06-23 VITALS — BP 140/70 | HR 84 | Temp 98.7°F | Wt 169.0 lb

## 2011-06-23 DIAGNOSIS — H60399 Other infective otitis externa, unspecified ear: Secondary | ICD-10-CM

## 2011-06-23 DIAGNOSIS — H6092 Unspecified otitis externa, left ear: Secondary | ICD-10-CM | POA: Insufficient documentation

## 2011-06-23 MED ORDER — NEOMYCIN-POLYMYXIN-HC 3.5-10000-1 OT SUSP: 3.0000 [drp] | Freq: Four times a day (QID) | OTIC | Status: AC

## 2011-06-23 NOTE — Progress Notes (Signed)
  Subjective:    Patient ID: Martha Hamilton, female    DOB: 1951-06-02, 60 y.o.   MRN: [id number]  HPI CC: earache  L earache started 1 day ago.  Some muffled hearing.  Today right side started bothering her some but mainly left ear pain.  Mildly more congested than normal.  Pressure HA as well.    No fevers/chills, abd pain, nausea, coughing, rashes, vision changes.  No tinnitus.  No smokers at home.  No sick contacts at home.    Diabetes well controlled per pt.  Review of Systems Per HPI    Objective:   Physical Exam  Nursing note and vitals reviewed. Constitutional: She appears well-developed and well-nourished. No distress.  HENT:  Head: Normocephalic and atraumatic.  Right Ear: Hearing, tympanic membrane and external ear normal.  Left Ear: Hearing, tympanic membrane, external ear and ear canal normal.  Nose: Nose normal. No mucosal edema or rhinorrhea. Right sinus exhibits no maxillary sinus tenderness and no frontal sinus tenderness. Left sinus exhibits no maxillary sinus tenderness and no frontal sinus tenderness.  Mouth/Throat: Uvula is midline, oropharynx is clear and moist and mucous membranes are normal. No oropharyngeal exudate, posterior oropharyngeal edema, posterior oropharyngeal erythema or tonsillar abscesses.       L external canal slightly edematous, tender to speculum exam, thicker paler cerumen than on other side. Bilateral TMs pearly grey, good light reflex, no erythema or bulge.  Eyes: Conjunctivae and EOM are normal. Pupils are equal, round, and reactive to light. No scleral icterus.  Neck: Normal range of motion. Neck supple.  Lymphadenopathy:    She has no cervical adenopathy.  Skin: Skin is warm and dry. No rash noted.       Assessment & Plan:

## 2011-06-23 NOTE — Patient Instructions (Signed)
Otitis Externa Otitis externa ("swimmer's ear") is a germ (bacterial) or fungal infection of the outer ear canal (from the eardrum to the outside of the ear). Swimming in dirty water may cause swimmer's ear. It also may be caused by moisture in the ear from water remaining after swimming or bathing. Often the first signs of infection may be itching in the ear canal. This may progress to ear canal swelling, redness, and pus drainage, which may be signs of infection. HOME CARE INSTRUCTIONS   Apply the antibiotic drops to the ear canal as prescribed by your doctor.   This can be a very painful medical condition. A strong pain reliever may be prescribed.   Only take over-the-counter or prescription medicines for pain, discomfort, or fever as directed by your caregiver.   If your caregiver has given you a follow-up appointment, it is very important to keep that appointment. Not keeping the appointment could result in a chronic or permanent injury, pain, hearing loss and disability. If there is any problem keeping the appointment, you must call back to this facility for assistance.  PREVENTION   It is important to keep your ear dry. Use the corner of a towel to wick water out of the ear canal after swimming or bathing.   Avoid scratching in your ear. This can damage the ear canal or remove the protective wax lining the canal and make it easier for germs (bacteria) or a fungus to grow.   You may use ear drops made of rubbing alcohol and vinegar after swimming to prevent future "swimmer's ear" infections. Make up a small bottle of equal parts white vinegar and alcohol. Put 3 or 4 drops into each ear after swimming.   Avoid swimming in lakes, polluted water, or poorly chlorinated pools.  SEEK MEDICAL CARE IF:   An oral temperature above 102 F (38.9 C) develops.   Your ear is still painful after 3 days and shows signs of getting worse (redness, swelling, pain, or pus).  MAKE SURE YOU:   Understand  these instructions.   Will watch your condition.   Will get help right away if you are not doing well or get worse.  Document Released: 06/30/2005 Document Revised: 03/12/2011 Document Reviewed: 02/04/2008 [facility] Patient Information 2012 [place], [place].

## 2011-06-23 NOTE — Assessment & Plan Note (Signed)
No TM perf noted today. Treat with cortisporin otic. Return if not improving as expected. Tylenol for discomfort. Lab Results  Component Value Date   HGBA1C 6.6* 04/23/2011

## 2011-07-16 ENCOUNTER — Encounter: Payer: Self-pay | Admitting: Family Medicine

## 2011-07-16 ENCOUNTER — Ambulatory Visit (INDEPENDENT_AMBULATORY_CARE_PROVIDER_SITE_OTHER): Payer: BC Managed Care – PPO | Admitting: Family Medicine

## 2011-07-16 VITALS — BP 164/80 | HR 84 | Temp 98.3°F | Ht 59.5 in | Wt 165.8 lb

## 2011-07-16 DIAGNOSIS — J069 Acute upper respiratory infection, unspecified: Secondary | ICD-10-CM

## 2011-07-16 MED ORDER — GUAIFENESIN-CODEINE 100-10 MG/5ML PO SYRP: 5.0000 mL | ORAL_SOLUTION | Freq: Four times a day (QID) | ORAL | Status: AC | PRN

## 2011-07-16 NOTE — Assessment & Plan Note (Signed)
With head and chest congestion in pt who had flu shot  Reassuring exam  Disc symptomatic care - see instructions on AVS  Trial of robitussin with codeine when not driving inst to update if not starting to improve in 7 d or if worse at any time Also handout given

## 2011-07-16 NOTE — Progress Notes (Signed)
Subjective:    Patient ID: Martha Hamilton, female    DOB: 19-Jan-1951, 61 y.o.   MRN: [id number]  HPI Started getting sick Saturday - cough and then lost her voice for several days Then headache - pressure in her face -- esp to bend over  No fever today--had fever last night - chills/ aches  Using saline nasal spray- bad nasal congestion  Yellow mucous when she coughs  No n/v/d  Patient Active Problem List  Diagnoses  . DIABETES MELLITUS, TYPE II  . HYPERLIPIDEMIA  . HYPERTENSION, ESSENTIAL NOS  . ALLERGIC RHINITIS  . INTERTRIGO, CANDIDAL  . OSTEOARTHRITIS, HANDS, BILATERAL  . BACK PAIN  . Right shoulder pain  . Routine general medical examination at a health care facility  . Special screening for malignant neoplasms, colon  . Gynecological examination  . Left otitis externa  . Viral URI   Past Medical History  Diagnosis Date  . Breast cancer   . Allergic rhinitis   . DM2 (diabetes mellitus, type 2)   . Hypertension   . Osteoarthritis     hands  . Obesity    No past surgical history on file. History  Substance Use Topics  . Smoking status: Never Smoker   . Smokeless tobacco: Never Used  . Alcohol Use: No   Family History  Problem Relation Age of Onset  . Diabetes Mother   . Stroke Father    Allergies  Allergen Reactions  . Atorvastatin     REACTION: UNKNOWN  . Cholestyramine     REACTION: unknown  . Niacin     REACTION: UNKNOWN   Current Outpatient Prescriptions on File Prior to Visit  Medication Sig Dispense Refill  . ACCU-CHEK COMPACT TEST DRUM test strip USE TO TEST SUGAR TWICE A DAY FOR DIABETES      MELLITUS  90 each  3  . ACCU-CHEK SOFTCLIX LANCETS lancets CHECK SUGAR TWICE A DAY FOR DIABETES MELLITUS  90 each  3  . amitriptyline (ELAVIL) 25 MG tablet TAKE 1 TABLET AT BEDTIME  90 tablet  3  . aspirin 81 MG tablet Take 81 mg by mouth daily.        . Cholecalciferol (VITAMIN D) 1000 UNITS capsule Take 2,000 Units by mouth daily.       Marland Kitchen desonide  (DESOWEN) 0.05 % lotion Apply small amount to affected area once daily as needed- for recurrent rash under breast.       . ibuprofen (ADVIL,MOTRIN) 200 MG tablet Take 200 mg by mouth as directed.        Marland Kitchen losartan (COZAAR) 100 MG tablet TAKE 1 TABLET DAILY  90 tablet  2  . metFORMIN (GLUCOPHAGE) 500 MG tablet TAKE 1 TABLET TWICE A DAY WITH BREAKFAST AND EVENING MEAL  180 tablet  2  . omeprazole (PRILOSEC) 20 MG capsule TAKE 1 CAPSULE DAILY  90 capsule  2  . OVER THE COUNTER MEDICATION Vitalizer Gold without Vitamin  K taking one group of supplement daily.       . simvastatin (ZOCOR) 40 MG tablet TAKE 1 TABLET DAILY  90 tablet  2  . TRICOR 145 MG tablet TAKE 1 TABLET DAILY  90 tablet  2  . diclofenac sodium (VOLTAREN) 1 % GEL Apply topically as needed.        . Multiple Vitamins-Minerals (CENTRUM SILVER PO) Take by mouth daily.        . naproxen (NAPROSYN) 500 MG tablet Take one po bid x 1 week then  prn pain, take with food  60 tablet  0  . Omega-3 Fatty Acids (FISH OIL) 1000 MG CAPS Take by mouth 2 (two) times daily.                Review of Systems Review of Systems  Constitutional: Negative for, appetite change,and unexpected weight change.  Eyes: Negative for pain and visual disturbance.  ENT pos for runny/stuffy nose/ sore throat / neg for ear pain Respiratory: Negative for sob or wheeze Cardiovascular: Negative for cp or palpitations    Gastrointestinal: Negative for nausea, diarrhea and constipation.  Genitourinary: Negative for urgency and frequency.  Skin: Negative for pallor or rash   Neurological: Negative for weakness, light-headedness, numbness and pos for headache Hematological: Negative for adenopathy. Does not bruise/bleed easily.  Psychiatric/Behavioral: Negative for dysphoric mood. The patient is not nervous/anxious.          Objective:   Physical Exam  Constitutional: She appears well-developed and well-nourished. No distress.       overwt and well appearing     HENT:  Head: Normocephalic and atraumatic.  Right Ear: External ear normal.  Left Ear: External ear normal.  Mouth/Throat: Oropharynx is clear and moist. No oropharyngeal exudate.       Nares are injected and congested   No sinus tenderness Throat with clear post nasal drip  Eyes: Conjunctivae and EOM are normal. Pupils are equal, round, and reactive to light. Right eye exhibits no discharge. Left eye exhibits no discharge.  Neck: Normal range of motion. Neck supple.  Cardiovascular: Normal rate, regular rhythm and normal heart sounds.   Pulmonary/Chest: Effort normal and breath sounds normal. No respiratory distress. She has no wheezes. She has no rales. She exhibits no tenderness.       Mildly harsh bs at bases No wheeze even on forced exp  Musculoskeletal: She exhibits no edema.  Lymphadenopathy:    She has no cervical adenopathy.  Neurological: She is alert.  Skin: Skin is warm and dry. No rash noted. No erythema. No pallor.  Psychiatric: She has a normal mood and affect.          Assessment & Plan:

## 2011-07-16 NOTE — Patient Instructions (Signed)
I think you have a head and chest cold  Ibuprofen is good for headache if it does not hurt your stomach (take it with food) Drink lots of fluids and get rest  Try the robitussin with codeine for cough- it may sedate so do not drive with it  Call if not starting to improve in 7 days or if worse at any time  Afrin nasal spray is ok for severe nasal congestion for 1-2 days -- no more than that as you can get rebound symptoms with it  Upper Respiratory Infection, Adult An upper respiratory infection (URI) is also sometimes known as the common cold. The upper respiratory tract includes the nose, sinuses, throat, trachea, and bronchi. Bronchi are the airways leading to the lungs. Most people improve within 1 week, but symptoms can last up to 2 weeks. A residual cough may last even longer.   CAUSES Many different viruses can infect the tissues lining the upper respiratory tract. The tissues become irritated and inflamed and often become very moist. Mucus production is also common. A cold is contagious. You can easily spread the virus to others by oral contact. This includes kissing, sharing a glass, coughing, or sneezing. Touching your mouth or nose and then touching a surface, which is then touched by another person, can also spread the virus. SYMPTOMS   Symptoms typically develop 1 to 3 days after you come in contact with a cold virus. Symptoms vary from person to person. They may include:  Runny nose.     Sneezing.    Nasal congestion.     Sinus irritation.     Sore throat.     Loss of voice (laryngitis).     Cough.    Fatigue.    Muscle aches.     Loss of appetite.     Headache.    Low-grade fever.  DIAGNOSIS   You might diagnose your own cold based on familiar symptoms, since most people get a cold 2 to 3 times a year. Your caregiver can confirm this based on your exam. Most importantly, your caregiver can check that your symptoms are not due to another disease such as strep  throat, sinusitis, pneumonia, asthma, or epiglottitis. Blood tests, throat tests, and X-rays are not necessary to diagnose a common cold, but they may sometimes be helpful in excluding other more serious diseases. Your caregiver will decide if any further tests are required. RISKS AND COMPLICATIONS   You may be at risk for a more severe case of the common cold if you smoke cigarettes, have chronic heart disease (such as heart failure) or lung disease (such as asthma), or if you have a weakened immune system. The very young and very old are also at risk for more serious infections. Bacterial sinusitis, middle ear infections, and bacterial pneumonia can complicate the common cold. The common cold can worsen asthma and chronic obstructive pulmonary disease (COPD). Sometimes, these complications can require emergency medical care and may be life-threatening. PREVENTION   The best way to protect against getting a cold is to practice good hygiene. Avoid oral or hand contact with people with cold symptoms. Wash your hands often if contact occurs. There is no clear evidence that vitamin C, vitamin E, echinacea, or exercise reduces the chance of developing a cold. However, it is always recommended to get plenty of rest and practice good nutrition. TREATMENT   Treatment is directed at relieving symptoms. There is no cure. Antibiotics are not effective, because the infection  is caused by a virus, not by bacteria. Treatment may include:  Increased fluid intake. Sports drinks offer valuable electrolytes, sugars, and fluids.     Breathing heated mist or steam (vaporizer or shower).     Eating chicken soup or other clear broths, and maintaining good nutrition.     Getting plenty of rest.     Using gargles or lozenges for comfort.     Controlling fevers with ibuprofen or acetaminophen as directed by your caregiver.     Increasing usage of your inhaler if you have asthma.  Zinc gel and zinc lozenges, taken in  the first 24 hours of the common cold, can shorten the duration and lessen the severity of symptoms. Pain medicines may help with fever, muscle aches, and throat pain. A variety of non-prescription medicines are available to treat congestion and runny nose. Your caregiver can make recommendations and may suggest nasal or lung inhalers for other symptoms.   HOME CARE INSTRUCTIONS    Only take over-the-counter or prescription medicines for pain, discomfort, or fever as directed by your caregiver.     Use a warm mist humidifier or inhale steam from a shower to increase air moisture. This may keep secretions moist and make it easier to breathe.     Drink enough water and fluids to keep your urine clear or pale yellow.     Rest as needed.     Return to work when your temperature has returned to normal or as your caregiver advises. You may need to stay home longer to avoid infecting others. You can also use a face mask and careful hand washing to prevent spread of the virus.  SEEK MEDICAL CARE IF:    After the first few days, you feel you are getting worse rather than better.     You need your caregiver's advice about medicines to control symptoms.     You develop chills, worsening shortness of breath, or brown or red sputum. These may be signs of pneumonia.     You develop yellow or brown nasal discharge or pain in the face, especially when you bend forward. These may be signs of sinusitis.     You develop a fever, swollen neck glands, pain with swallowing, or white areas in the back of your throat. These may be signs of strep throat.  SEEK IMMEDIATE MEDICAL CARE IF:    You have a fever.     You develop severe or persistent headache, ear pain, sinus pain, or chest pain.     You develop wheezing, a prolonged cough, cough up blood, or have a change in your usual mucus (if you have chronic lung disease).     You develop sore muscles or a stiff neck.  Document Released: 12/24/2000 Document  Revised: 03/12/2011 Document Reviewed: 11/01/2010 [facility] Patient Information 2012 [place], [place].

## 2011-10-21 ENCOUNTER — Other Ambulatory Visit (INDEPENDENT_AMBULATORY_CARE_PROVIDER_SITE_OTHER): Payer: BC Managed Care – PPO

## 2011-10-21 DIAGNOSIS — E785 Hyperlipidemia, unspecified: Secondary | ICD-10-CM

## 2011-10-21 DIAGNOSIS — E119 Type 2 diabetes mellitus without complications: Secondary | ICD-10-CM

## 2011-10-21 DIAGNOSIS — I10 Essential (primary) hypertension: Secondary | ICD-10-CM

## 2011-10-21 LAB — LIPID PANEL
LDL Cholesterol: 51 mg/dL (ref 0–99)
Total CHOL/HDL Ratio: 3

## 2011-10-21 LAB — COMPREHENSIVE METABOLIC PANEL
Albumin: 4.1 g/dL (ref 3.5–5.2)
BUN: 15 mg/dL (ref 6–23)
Calcium: 9.8 mg/dL (ref 8.4–10.5)
Chloride: 104 mEq/L (ref 96–112)
Glucose, Bld: 127 mg/dL — ABNORMAL HIGH (ref 70–99)
Potassium: 4.1 mEq/L (ref 3.5–5.1)

## 2011-10-23 LAB — HEMOGLOBIN A1C: Hgb A1c MFr Bld: 6.9 % — ABNORMAL HIGH (ref 4.6–6.5)

## 2011-10-28 ENCOUNTER — Encounter: Payer: Self-pay | Admitting: Family Medicine

## 2011-10-28 ENCOUNTER — Ambulatory Visit (INDEPENDENT_AMBULATORY_CARE_PROVIDER_SITE_OTHER): Payer: BC Managed Care – PPO | Admitting: Family Medicine

## 2011-10-28 ENCOUNTER — Other Ambulatory Visit: Payer: Self-pay | Admitting: *Deleted

## 2011-10-28 VITALS — BP 133/82 | HR 98 | Temp 98.2°F | Ht 59.5 in | Wt 166.2 lb

## 2011-10-28 DIAGNOSIS — I10 Essential (primary) hypertension: Secondary | ICD-10-CM

## 2011-10-28 DIAGNOSIS — L538 Other specified erythematous conditions: Secondary | ICD-10-CM

## 2011-10-28 DIAGNOSIS — E119 Type 2 diabetes mellitus without complications: Secondary | ICD-10-CM

## 2011-10-28 DIAGNOSIS — E785 Hyperlipidemia, unspecified: Secondary | ICD-10-CM

## 2011-10-28 MED ORDER — CLOTRIMAZOLE-BETAMETHASONE 1-0.05 % EX CREA: TOPICAL_CREAM | Freq: Two times a day (BID) | CUTANEOUS | Status: DC

## 2011-10-28 MED ORDER — GLUCOSE BLOOD VI STRP: ORAL_STRIP | Status: DC

## 2011-10-28 NOTE — Progress Notes (Signed)
Subjective:    Patient ID: Martha Hamilton, female    DOB: [birth year], 61 y.o.   MRN: [id number]  HPI Here for f/u of chronic conditions Is feeling good overall   More trouble with intertrigo rash - under arms and in the pelvic area  Is using desonide lotion    Lab Results  Component Value Date   TSH 1.49 04/23/2011     bp is  150/80   Today- usually comes down for the 2nd check  No cp or palpitations or headaches or edema  No side effects to medicines    Diabetes Home sugar results - doing well overall - no changes  DM diet - is sticking to that well  Exercise - walks from 60-90 minutes per day-really helps her -- walking around the house -- while her mother naps  Symptoms-- none  A1C last  6.9- up just a bit   (is under a lot of stress caring for her elderly mother - and is not getting a good rest) No problems with medications - is on metformin Renal protection-on arb  Last eye exam - recent   Wt is up 1 lb with bmi of 33  Hyperlipidemia Lab Results  Component Value Date   CHOL 128 10/21/2011   HDL 41.30 10/21/2011   LDLCALC 51 10/21/2011   LDLDIRECT 62.6 04/23/2011   TRIG 180.0* 10/21/2011   CHOLHDL 3 10/21/2011    On tricor and zocor Diet is fairly optimal without fatty foods or red meat  Patient Active Problem List  Diagnoses  . DIABETES MELLITUS, TYPE II  . HYPERLIPIDEMIA  . HYPERTENSION, ESSENTIAL NOS  . ALLERGIC RHINITIS  . INTERTRIGO, CANDIDAL  . OSTEOARTHRITIS, HANDS, BILATERAL  . BACK PAIN  . Right shoulder pain  . Routine general medical examination at a health care facility  . Special screening for malignant neoplasms, colon  . Gynecological examination  . Left otitis externa   Past Medical History  Diagnosis Date  . Breast cancer   . Allergic rhinitis   . DM2 (diabetes mellitus, type 2)   . Hypertension   . Osteoarthritis     hands  . Obesity    No past surgical history on file. History  Substance Use Topics  . Smoking status: Never Smoker   .  Smokeless tobacco: Never Used  . Alcohol Use: No   Family History  Problem Relation Age of Onset  . Diabetes Mother   . Stroke Father    Allergies  Allergen Reactions  . Atorvastatin     REACTION: UNKNOWN  . Cholestyramine     REACTION: unknown  . Niacin     REACTION: UNKNOWN   Current Outpatient Prescriptions on File Prior to Visit  Medication Sig Dispense Refill  . amitriptyline (ELAVIL) 25 MG tablet TAKE 1 TABLET AT BEDTIME  90 tablet  3  . aspirin 81 MG tablet Take 81 mg by mouth daily.        . Cholecalciferol (VITAMIN D) 1000 UNITS capsule Take 2,000 Units by mouth daily.       Marland Kitchen desonide (DESOWEN) 0.05 % lotion Apply small amount to affected area once daily as needed- for recurrent rash under breast.       . diclofenac sodium (VOLTAREN) 1 % GEL Apply topically as needed.        Marland Kitchen ibuprofen (ADVIL,MOTRIN) 200 MG tablet Take 200 mg by mouth as directed.        Marland Kitchen losartan (COZAAR) 100 MG tablet TAKE  1 TABLET DAILY  90 tablet  2  . metFORMIN (GLUCOPHAGE) 500 MG tablet TAKE 1 TABLET TWICE A DAY WITH BREAKFAST AND EVENING MEAL  180 tablet  2  . OMEGA 3 1200 MG CAPS Take 1 capsule by mouth 2 (two) times daily.        Marland Kitchen omeprazole (PRILOSEC) 20 MG capsule TAKE 1 CAPSULE DAILY  90 capsule  2  . OVER THE COUNTER MEDICATION Vitalizer Gold without Vitamin  K taking one group of supplement daily.       Marland Kitchen SALINE NASAL SPRAY NA Place 1 spray into the nose 3 (three) times daily.      . simvastatin (ZOCOR) 40 MG tablet TAKE 1 TABLET DAILY  90 tablet  2  . TRICOR 145 MG tablet TAKE 1 TABLET DAILY  90 tablet  2         Review of Systems Review of Systems  Constitutional: Negative for fever, appetite change, fatigue and unexpected weight change.  Eyes: Negative for pain and visual disturbance.  Respiratory: Negative for cough and shortness of breath.   Cardiovascular: Negative for cp or palpitations    Gastrointestinal: Negative for nausea, diarrhea and constipation.  Genitourinary:  Negative for urgency and frequency.  Skin: Negative for pallor and pos for itchy rash (neg for oozing) Neurological: Negative for weakness, light-headedness, numbness and headaches.  Hematological: Negative for adenopathy. Does not bruise/bleed easily.  Psychiatric/Behavioral: Negative for dysphoric mood. The patient is not nervous/anxious.          Objective:   Physical Exam  Constitutional: She appears well-developed and well-nourished. No distress.       overwt and well appearing   HENT:  Head: Normocephalic and atraumatic.  Right Ear: External ear normal.  Left Ear: External ear normal.  Nose: Nose normal.  Mouth/Throat: Oropharynx is clear and moist.  Eyes: Conjunctivae and EOM are normal. Pupils are equal, round, and reactive to light. No scleral icterus.  Neck: Normal range of motion. Neck supple. No JVD present. Carotid bruit is not present. No thyromegaly present.  Cardiovascular: Normal rate, regular rhythm, normal heart sounds and intact distal pulses.  Exam reveals no gallop.   Pulmonary/Chest: Effort normal and breath sounds normal. No respiratory distress. She has no wheezes.  Abdominal: Soft. Bowel sounds are normal. She exhibits no distension, no abdominal bruit and no mass. There is no tenderness.  Musculoskeletal: Normal range of motion. She exhibits no edema and no tenderness.  Lymphadenopathy:    She has no cervical adenopathy.  Neurological: She is alert. She has normal reflexes. No cranial nerve deficit. Coordination normal.  Skin: Skin is warm and dry. Rash noted. There is erythema. No pallor.       Erythematous rash- confluent redness with well demarcated borders under R breast and in L axillary area   Psychiatric: She has a normal mood and affect.       cheerful          Assessment & Plan:

## 2011-10-28 NOTE — Assessment & Plan Note (Signed)
a1c up a bit - likely from stress lately (but still under 7) Will check sugar qd to qod Rev diet/ exercise  Making intertrigo worse  Will keep an eye on that

## 2011-10-28 NOTE — Assessment & Plan Note (Signed)
bp better on 2nd check  Will continue to follow No change in medicines

## 2011-10-28 NOTE — Assessment & Plan Note (Signed)
Today under R breast and L axilla Will try lotrisone - see if it works better than steroid cream and update  Disc imp of keeping area dry

## 2011-10-28 NOTE — Telephone Encounter (Signed)
Rx faxed to [facility] at [phone]. 

## 2011-10-28 NOTE — Assessment & Plan Note (Signed)
Disc goals for lipids and reasons to control them Rev labs with pt Rev low sat fat diet in detail  Is controlled with tricor and zocor- she tolerates well with no problems

## 2011-10-28 NOTE — Patient Instructions (Signed)
Try lotrisone for your yeast rash to see if that works better Make sure areas are very dry (use a hair dryer on cool setting)  Update me if this does not improve  Sugar control is off just a bit - watch your diet  Try to get a little help at home so you have some time to take care of yourself  Follow up in 6 months for annual exam with labs prior

## 2011-12-29 ENCOUNTER — Ambulatory Visit (INDEPENDENT_AMBULATORY_CARE_PROVIDER_SITE_OTHER): Payer: BC Managed Care – PPO | Admitting: Family Medicine

## 2011-12-29 ENCOUNTER — Encounter: Payer: Self-pay | Admitting: Family Medicine

## 2011-12-29 VITALS — BP 142/78 | HR 107 | Temp 98.2°F | Ht 59.5 in | Wt 170.5 lb

## 2011-12-29 DIAGNOSIS — K1120 Sialoadenitis, unspecified: Secondary | ICD-10-CM | POA: Insufficient documentation

## 2011-12-29 DIAGNOSIS — J029 Acute pharyngitis, unspecified: Secondary | ICD-10-CM | POA: Insufficient documentation

## 2011-12-29 LAB — POCT RAPID STREP A (OFFICE): Rapid Strep A Screen: NEGATIVE

## 2011-12-29 NOTE — Patient Instructions (Addendum)
I think you have parotid gland swelling (parotiditis) Use warm compresses as you have been doing  Also suck on sour sugar free losenges or candies to help salivary flow If worse or not improved in 3-4 days update me If pain or fever also update me

## 2011-12-29 NOTE — Progress Notes (Signed)
Subjective:    Patient ID: Martha Hamilton, female    DOB: 1950/09/15, 61 y.o.   MRN: [id number]  HPI Little scratchy throat sat am - then felt like her glands on R side of neck were swollen  Yesterday worse- used a warm moist compress on that side of her neck  Taking tylenol  Mostly now she notices some facial swelling in front of R ear  No redness or rash    Had a little fever Sunday am- was warm/ chills- low grade temp  No rash No tick bites No exp to strep   No cold symptoms but does get post nasal drip from pollen   Patient Active Problem List  Diagnosis  . DIABETES MELLITUS, TYPE II  . HYPERLIPIDEMIA  . HYPERTENSION, ESSENTIAL NOS  . ALLERGIC RHINITIS  . INTERTRIGO, CANDIDAL  . OSTEOARTHRITIS, HANDS, BILATERAL  . BACK PAIN  . Right shoulder pain  . Routine general medical examination at a health care facility  . Special screening for malignant neoplasms, colon  . Gynecological examination  . Left otitis externa   Past Medical History  Diagnosis Date  . Breast cancer   . Allergic rhinitis   . DM2 (diabetes mellitus, type 2)   . Hypertension   . Osteoarthritis     hands  . Obesity    No past surgical history on file. History  Substance Use Topics  . Smoking status: Never Smoker   . Smokeless tobacco: Never Used  . Alcohol Use: No   Family History  Problem Relation Age of Onset  . Diabetes Mother   . Stroke Father    Allergies  Allergen Reactions  . Atorvastatin     REACTION: UNKNOWN  . Cholestyramine     REACTION: unknown  . Niacin     REACTION: UNKNOWN   Current Outpatient Prescriptions on File Prior to Visit  Medication Sig Dispense Refill  . ACCU-CHEK SOFTCLIX LANCETS lancets       . amitriptyline (ELAVIL) 25 MG tablet TAKE 1 TABLET AT BEDTIME  90 tablet  3  . aspirin 81 MG tablet Take 81 mg by mouth daily.        . Cholecalciferol (VITAMIN D) 1000 UNITS capsule Take 2,000 Units by mouth daily.       . clotrimazole-betamethasone (LOTRISONE)  cream Apply topically 2 (two) times daily. As needed for yeast rash  15 g  1  . desonide (DESOWEN) 0.05 % lotion Apply small amount to affected area once daily as needed- for recurrent rash under breast.       . diclofenac sodium (VOLTAREN) 1 % GEL Apply topically as needed.        . Glucosamine-MSM-Hyaluronic Acd (JOINT HEALTH) 750-375-30 MG TABS Take 2 tablets by mouth daily.      Marland Kitchen glucose blood (ACCU-CHEK COMPACT TEST DRUM) test strip Use to test sugar once daily  100 each  3  . ibuprofen (ADVIL,MOTRIN) 200 MG tablet Take 200 mg by mouth as directed.        Marland Kitchen losartan (COZAAR) 100 MG tablet TAKE 1 TABLET DAILY  90 tablet  2  . metFORMIN (GLUCOPHAGE) 500 MG tablet TAKE 1 TABLET TWICE A DAY WITH BREAKFAST AND EVENING MEAL  180 tablet  2  . OMEGA 3 1200 MG CAPS Take 1 capsule by mouth 2 (two) times daily.        . Omega-3 Fatty Acids (FISH OIL) 1200 MG CAPS Take 1 capsule by mouth daily.      Marland Kitchen  omeprazole (PRILOSEC) 20 MG capsule TAKE 1 CAPSULE DAILY  90 capsule  2  . OVER THE COUNTER MEDICATION Vitalizer Gold without Vitamin  K taking one group of supplement daily.       Marland Kitchen SALINE NASAL SPRAY NA Place 1 spray into the nose 3 (three) times daily.      . simvastatin (ZOCOR) 40 MG tablet TAKE 1 TABLET DAILY  90 tablet  2  . TRICOR 145 MG tablet TAKE 1 TABLET DAILY  90 tablet  2      Review of Systems Review of Systems  Constitutional: Negative for fever, appetite change, fatigue and unexpected weight change.  Eyes: Negative for pain and visual disturbance.  ENT pos for mild ST and post drip/ neg for throat or mouth ulcers or sinus pain  Respiratory: Negative for cough and shortness of breath.   Cardiovascular: Negative for cp or palpitations    Gastrointestinal: Negative for nausea, diarrhea and constipation.  Genitourinary: Negative for urgency and frequency.  Skin: Negative for pallor or rash   Neurological: Negative for weakness, light-headedness, numbness and headaches.  Hematological:  Negative for adenopathy. Does not bruise/bleed easily.  Psychiatric/Behavioral: Negative for dysphoric mood. The patient is not nervous/anxious.         Objective:   Physical Exam  Constitutional: She appears well-developed and well-nourished. No distress.       Obese and well appearing   HENT:  Head: Normocephalic and atraumatic.  Right Ear: External ear normal.  Left Ear: External ear normal.  Nose: Nose normal.  Mouth/Throat: Oropharynx is clear and moist. No oropharyngeal exudate.       Mild post throat injection No mouth or throat ulcers L parotid area is slightly swollen/ non tender   Eyes: Conjunctivae and EOM are normal. Pupils are equal, round, and reactive to light. Right eye exhibits no discharge. Left eye exhibits no discharge.  Neck: Normal range of motion. Neck supple. No JVD present. No tracheal deviation present. No thyromegaly present.  Cardiovascular: Normal rate and regular rhythm.   Pulmonary/Chest: Effort normal and breath sounds normal. No respiratory distress. She has no wheezes.  Lymphadenopathy:    She has no cervical adenopathy.  Skin: Skin is warm and dry. No rash noted. No erythema. No pallor.       No rash or insect bites  Psychiatric: She has a normal mood and affect.          Assessment & Plan:

## 2011-12-29 NOTE — Assessment & Plan Note (Signed)
Neg rapid strep today

## 2011-12-29 NOTE — Assessment & Plan Note (Signed)
Mild swelling in parotid area - that is improved after using warm compresses B9 exam otherwise and neg rapid strep test Will tx sympt with warm compresses Also sour foods/ sugar free candies to stimulate salivary flow Update if not starting to improve in a week or if worsening

## 2012-01-01 ENCOUNTER — Other Ambulatory Visit: Payer: Self-pay

## 2012-01-01 MED ORDER — SIMVASTATIN 40 MG PO TABS: 40.0000 mg | ORAL_TABLET | Freq: Every day | ORAL | Status: DC

## 2012-01-01 NOTE — Telephone Encounter (Signed)
[facility] simvastatin 40 mg # 90 x 3.

## 2012-01-02 ENCOUNTER — Other Ambulatory Visit: Payer: Self-pay | Admitting: Family Medicine

## 2012-01-02 NOTE — Telephone Encounter (Signed)
Done

## 2012-01-08 ENCOUNTER — Other Ambulatory Visit: Payer: Self-pay | Admitting: Family Medicine

## 2012-01-08 NOTE — Telephone Encounter (Signed)
[facility] request refill fenofibrate # 90 x 3.

## 2012-02-12 ENCOUNTER — Other Ambulatory Visit: Payer: Self-pay | Admitting: Family Medicine

## 2012-02-13 ENCOUNTER — Other Ambulatory Visit: Payer: Self-pay | Admitting: Family Medicine

## 2012-02-13 DIAGNOSIS — Z1231 Encounter for screening mammogram for malignant neoplasm of breast: Secondary | ICD-10-CM

## 2012-02-26 ENCOUNTER — Telehealth: Payer: Self-pay

## 2012-02-26 ENCOUNTER — Other Ambulatory Visit: Payer: Self-pay

## 2012-02-26 NOTE — Telephone Encounter (Signed)
Prior auth needed for Omeprazole; Form is on Dr Royden Purl shelf.

## 2012-02-26 NOTE — Telephone Encounter (Signed)
Pt said [facility] not sent omeprazole refill; today was told would need PA. Pt to [facility] to send Dr Milinda Antis a PA request.

## 2012-02-27 NOTE — Telephone Encounter (Signed)
Done and in IN box 

## 2012-02-27 NOTE — Telephone Encounter (Signed)
Form faxed to [phone].

## 2012-03-01 NOTE — Telephone Encounter (Signed)
Prior auth given for omeprazole; letter placed on Dr Royden Purl shelf for signature and scanning.

## 2012-03-25 ENCOUNTER — Other Ambulatory Visit: Payer: Self-pay

## 2012-03-25 MED ORDER — OMEPRAZOLE 20 MG PO CPDR: 20.0000 mg | DELAYED_RELEASE_CAPSULE | Freq: Every day | ORAL | Status: DC

## 2012-03-25 NOTE — Telephone Encounter (Signed)
Pt left v/m requesting omeprazole # 30 to [facility] until get mail order rx. Refill done and pt notified.

## 2012-03-26 ENCOUNTER — Ambulatory Visit
Admission: RE | Admit: 2012-03-26 | Discharge: 2012-03-26 | Disposition: A | Discharge status: Home / Self Care | Payer: BC Managed Care – PPO | Source: Ambulatory Visit | Attending: Family Medicine | Admitting: Family Medicine

## 2012-03-26 ENCOUNTER — Other Ambulatory Visit: Payer: Self-pay | Admitting: Family Medicine

## 2012-03-26 DIAGNOSIS — Z1231 Encounter for screening mammogram for malignant neoplasm of breast: Secondary | ICD-10-CM

## 2012-03-31 ENCOUNTER — Encounter: Payer: Self-pay | Admitting: *Deleted

## 2012-03-31 ENCOUNTER — Other Ambulatory Visit: Payer: Self-pay | Admitting: *Deleted

## 2012-03-31 MED ORDER — OMEPRAZOLE 20 MG PO CPDR: 20.0000 mg | DELAYED_RELEASE_CAPSULE | Freq: Every day | ORAL | Status: DC

## 2012-04-01 ENCOUNTER — Ambulatory Visit (INDEPENDENT_AMBULATORY_CARE_PROVIDER_SITE_OTHER): Payer: BC Managed Care – PPO

## 2012-04-01 DIAGNOSIS — Z23 Encounter for immunization: Secondary | ICD-10-CM

## 2012-04-27 ENCOUNTER — Telehealth: Payer: Self-pay | Admitting: Family Medicine

## 2012-04-27 DIAGNOSIS — E119 Type 2 diabetes mellitus without complications: Secondary | ICD-10-CM

## 2012-04-27 DIAGNOSIS — E785 Hyperlipidemia, unspecified: Secondary | ICD-10-CM

## 2012-04-27 DIAGNOSIS — I10 Essential (primary) hypertension: Secondary | ICD-10-CM

## 2012-04-27 DIAGNOSIS — Z0000 Encounter for general adult medical examination without abnormal findings: Secondary | ICD-10-CM

## 2012-04-27 NOTE — Telephone Encounter (Signed)
Message copied by [name] on Tue Apr 27, 2012  9:13 PM ------      Message from: [name]      Created: Thu Apr 22, 2012 11:19 AM      Regarding: Lab orders for Wednesday 10.15.13       Patient is scheduled for CPX labs, please order future labs, Thanks , Camelia Eng

## 2012-04-28 ENCOUNTER — Other Ambulatory Visit (INDEPENDENT_AMBULATORY_CARE_PROVIDER_SITE_OTHER): Payer: BC Managed Care – PPO

## 2012-04-28 DIAGNOSIS — E785 Hyperlipidemia, unspecified: Secondary | ICD-10-CM

## 2012-04-28 DIAGNOSIS — Z0000 Encounter for general adult medical examination without abnormal findings: Secondary | ICD-10-CM

## 2012-04-28 DIAGNOSIS — E119 Type 2 diabetes mellitus without complications: Secondary | ICD-10-CM

## 2012-04-28 LAB — COMPREHENSIVE METABOLIC PANEL
ALT: 27 U/L (ref 0–35)
CO2: 27 mEq/L (ref 19–32)
Calcium: 9.1 mg/dL (ref 8.4–10.5)
Chloride: 105 mEq/L (ref 96–112)
GFR: 116.87 mL/min (ref >60.00)
Sodium: 140 mEq/L (ref 135–145)
Total Protein: 7.5 g/dL (ref 6.0–8.3)

## 2012-04-28 LAB — CBC WITH DIFFERENTIAL/PLATELET
Basophils Absolute: 0 10*3/uL (ref 0.0–0.1)
Lymphocytes Relative: 35.8 % (ref 12.0–46.0)
Monocytes Relative: 7 % (ref 3.0–12.0)
Neutrophils Relative %: 53.2 % (ref 43.0–77.0)
Platelets: 364 10*3/uL (ref 150.0–400.0)
RDW: 14.4 % (ref 11.5–14.6)

## 2012-04-28 LAB — LIPID PANEL
HDL: 38.6 mg/dL — ABNORMAL LOW (ref >39.00)
Total CHOL/HDL Ratio: 4

## 2012-04-28 LAB — LDL CHOLESTEROL, DIRECT: Direct LDL: 76.5 mg/dL

## 2012-04-28 LAB — TSH: TSH: 2.01 u[IU]/mL (ref 0.35–5.50)

## 2012-05-04 ENCOUNTER — Ambulatory Visit (INDEPENDENT_AMBULATORY_CARE_PROVIDER_SITE_OTHER): Payer: BC Managed Care – PPO | Admitting: Family Medicine

## 2012-05-04 ENCOUNTER — Encounter: Payer: Self-pay | Admitting: Family Medicine

## 2012-05-04 VITALS — BP 140/80 | HR 100 | Temp 98.2°F | Ht 59.5 in | Wt 170.8 lb

## 2012-05-04 DIAGNOSIS — Z0000 Encounter for general adult medical examination without abnormal findings: Secondary | ICD-10-CM

## 2012-05-04 DIAGNOSIS — Z1211 Encounter for screening for malignant neoplasm of colon: Secondary | ICD-10-CM

## 2012-05-04 DIAGNOSIS — N950 Postmenopausal bleeding: Secondary | ICD-10-CM

## 2012-05-04 DIAGNOSIS — I10 Essential (primary) hypertension: Secondary | ICD-10-CM

## 2012-05-04 DIAGNOSIS — E785 Hyperlipidemia, unspecified: Secondary | ICD-10-CM

## 2012-05-04 DIAGNOSIS — E119 Type 2 diabetes mellitus without complications: Secondary | ICD-10-CM

## 2012-05-04 NOTE — Assessment & Plan Note (Signed)
a1c and wt are up due to less exercise Will work on that with some walking videos F/u 3 mo with a1c

## 2012-05-04 NOTE — Patient Instructions (Addendum)
Get back to exercise to get sugars down/ weight down/ blood pressure down  Try a walking video We will refer you to gyn for the bleeding problem you are having  Please do stool card for colon cancer screening  Follow up with me in 3 months with labs prior (not fasting)  No change in your medicines

## 2012-05-04 NOTE — Assessment & Plan Note (Signed)
bp better on 2nd check Urged her to get back to exercise- will try a walking video Wt is up a bit also F/u 3 mo

## 2012-05-04 NOTE — Assessment & Plan Note (Signed)
Disc goals for lipids and reasons to control them Rev labs with pt Rev low sat fat diet in detail  On statin and good diet

## 2012-05-04 NOTE — Progress Notes (Signed)
Subjective:    Patient ID: Martha Hamilton, female    DOB: 1951-05-16, 61 y.o.   MRN: [id number]  HPI Here for health maintenance exam and to review chronic medical problems    Obese with bmi of 33 and wt gain of 5 lb  Is not exercising as much on the Wii due to shoulder problems -- is going to PT  Can do walking   Colon ca screen-- no colonoscopy in a long time  Neg ifob last year Mild anemia this check No blood in stool  No abd pain  Lab Results  Component Value Date   WBC 6.2 04/28/2012   HGB 11.7* 04/28/2012   HCT 36.3 04/28/2012   MCV 84.5 04/28/2012   PLT 364.0 04/28/2012     Hx of breast ca Mam 9/13 Self  Exam  Pap 10/12  No menses since she was 51-now having some light bleeding with cramps  This has never happened before    imms up to date  bp is up today - she does not know why -- ? Just took her bp pill No cp or palpitations or headaches or edema  No side effects to medicines  BP Readings from Last 3 Encounters:  05/04/12 166/94  12/29/11 142/78  10/28/11 133/82     Chemistry      Component Value Date/Time   NA 140 04/28/2012 0810   K 4.2 04/28/2012 0810   CL 105 04/28/2012 0810   CO2 27 04/28/2012 0810   BUN 14 04/28/2012 0810   CREATININE 0.6 04/28/2012 0810      Component Value Date/Time   CALCIUM 9.1 04/28/2012 0810   ALKPHOS 42 04/28/2012 0810   AST 30 04/28/2012 0810   ALT 27 04/28/2012 0810   BILITOT 0.4 04/28/2012 0810         DM- a1c is up from 6.9 to 7.2 Poss from not enough exercise Still eating a diabetic diet  Eating more vegetables  Watches her fruit and carbs    Lab Results  Component Value Date   CHOL 137 04/28/2012   CHOL 128 10/21/2011   CHOL 129 04/23/2011   Lab Results  Component Value Date   HDL 38.60* 04/28/2012   HDL 41.30 10/21/2011   HDL 44.80 04/23/2011   Lab Results  Component Value Date   LDLCALC 51 10/21/2011   LDLCALC 48 04/29/2010   Lab Results  Component Value Date   TRIG 218.0* 04/28/2012   TRIG  180.0* 10/21/2011   TRIG 272.0* 04/23/2011   Lab Results  Component Value Date   CHOLHDL 4 04/28/2012   CHOLHDL 3 10/21/2011   CHOLHDL 3 04/23/2011   Lab Results  Component Value Date   LDLDIRECT 76.5 04/28/2012   LDLDIRECT 62.6 04/23/2011   LDLDIRECT 58.5 10/21/2010   On statin and diet - in good control   Patient Active Problem List  Diagnosis  . DIABETES MELLITUS, TYPE II  . HYPERLIPIDEMIA  . HYPERTENSION, ESSENTIAL NOS  . ALLERGIC RHINITIS  . INTERTRIGO, CANDIDAL  . OSTEOARTHRITIS, HANDS, BILATERAL  . BACK PAIN  . Right shoulder pain  . Routine general medical examination at a health care facility  . Special screening for malignant neoplasms, colon  . Gynecological examination  . Left otitis externa  . Sore throat  . Parotiditis   Past Medical History  Diagnosis Date  . Breast cancer   . Allergic rhinitis   . DM2 (diabetes mellitus, type 2)   . Hypertension   . Osteoarthritis  hands  . Obesity    No past surgical history on file. History  Substance Use Topics  . Smoking status: Never Smoker   . Smokeless tobacco: Never Used  . Alcohol Use: No   Family History  Problem Relation Age of Onset  . Diabetes Mother   . Stroke Father    Allergies  Allergen Reactions  . Atorvastatin     REACTION: UNKNOWN  . Cholestyramine     REACTION: unknown  . Niacin     REACTION: UNKNOWN   Current Outpatient Prescriptions on File Prior to Visit  Medication Sig Dispense Refill  . ACCU-CHEK SOFTCLIX LANCETS lancets       . amitriptyline (ELAVIL) 25 MG tablet TAKE 1 TABLET AT BEDTIME  90 tablet  3  . aspirin 81 MG tablet Take 81 mg by mouth daily.        . Cholecalciferol (VITAMIN D) 1000 UNITS capsule Take 2,000 Units by mouth daily.       . diclofenac sodium (VOLTAREN) 1 % GEL Apply topically as needed.        . fenofibrate (TRICOR) 145 MG tablet TAKE 1 TABLET DAILY  90 tablet  3  . Glucosamine-MSM-Hyaluronic Acd (JOINT HEALTH) 750-375-30 MG TABS Take 2 tablets by  mouth daily.      Marland Kitchen glucose blood (ACCU-CHEK COMPACT TEST DRUM) test strip Use to test sugar once daily  100 each  3  . ibuprofen (ADVIL,MOTRIN) 200 MG tablet Take 200 mg by mouth as directed.        Marland Kitchen losartan (COZAAR) 100 MG tablet TAKE 1 TABLET DAILY  90 tablet  1  . metFORMIN (GLUCOPHAGE) 500 MG tablet TAKE 1 TABLET TWICE A DAY WITH BREAKFAST AND EVENING MEAL  180 tablet  2  . Omega-3 Fatty Acids (FISH OIL) 1200 MG CAPS Take 1 capsule by mouth daily.      Marland Kitchen omeprazole (PRILOSEC) 20 MG capsule Take 1 capsule (20 mg total) by mouth daily.  90 capsule  0  . OVER THE COUNTER MEDICATION Vitalizer Gold without Vitamin  K taking one group of supplement daily.       Marland Kitchen SALINE NASAL SPRAY NA Place 1 spray into the nose 3 (three) times daily.      . simvastatin (ZOCOR) 40 MG tablet Take 1 tablet (40 mg total) by mouth at bedtime.  90 tablet  3    Review of Systems Review of Systems  Constitutional: Negative for fever, appetite change, fatigue and unexpected weight change.  Eyes: Negative for pain and visual disturbance.  Respiratory: Negative for cough and shortness of breath.   Cardiovascular: Negative for cp or palpitations    Gastrointestinal: Negative for nausea, diarrhea and constipation.  Genitourinary: Negative for urgency and frequency. pos for vaginal bleeding Skin: Negative for pallor or rash   Neurological: Negative for weakness, light-headedness, numbness and headaches.  Hematological: Negative for adenopathy. Does not bruise/bleed easily.  Psychiatric/Behavioral: Negative for dysphoric mood. The patient is not nervous/anxious.         Objective:   Physical Exam  Constitutional: She appears well-developed and well-nourished. No distress.       overwt and well appearing   HENT:  Head: Normocephalic and atraumatic.  Right Ear: External ear normal.  Left Ear: External ear normal.  Nose: Nose normal.  Mouth/Throat: Oropharynx is clear and moist.  Eyes: Conjunctivae normal and EOM  are normal. Pupils are equal, round, and reactive to light.  Neck: Normal range of motion. Neck supple.  No JVD present. Carotid bruit is not present. No thyromegaly present.  Cardiovascular: Normal rate, regular rhythm, normal heart sounds and intact distal pulses.  Exam reveals no gallop.   Pulmonary/Chest: Effort normal and breath sounds normal. No respiratory distress. She has no wheezes.  Abdominal: Soft. Bowel sounds are normal. She exhibits no distension, no abdominal bruit and no mass. There is no tenderness.  Musculoskeletal: Normal range of motion. She exhibits no edema and no tenderness.  Lymphadenopathy:    She has no cervical adenopathy.  Neurological: She is alert. She has normal reflexes. No cranial nerve deficit. She exhibits normal muscle tone. Coordination normal.  Skin: Skin is warm and dry. No rash noted. No erythema. No pallor.  Psychiatric: She has a normal mood and affect.          Assessment & Plan:

## 2012-05-04 NOTE — Assessment & Plan Note (Signed)
ifob today Is slt anemic- however this may be due to vaginal bleeding

## 2012-05-04 NOTE — Assessment & Plan Note (Signed)
Adv pt this is abn Is mildly anemic  Ref to gyn for further eval - likely [place] and endomet bx - explained Re check cbc 3 mo as well

## 2012-05-04 NOTE — Assessment & Plan Note (Signed)
Reviewed health habits including diet and exercise and skin cancer prevention Also reviewed health mt list, fam hx and immunizations   Rev wellness lab in detail utd imms

## 2012-05-06 ENCOUNTER — Other Ambulatory Visit: Payer: BC Managed Care – PPO

## 2012-05-06 DIAGNOSIS — Z1211 Encounter for screening for malignant neoplasm of colon: Secondary | ICD-10-CM

## 2012-05-07 ENCOUNTER — Encounter: Payer: Self-pay | Admitting: *Deleted

## 2012-05-12 ENCOUNTER — Encounter: Payer: Self-pay | Admitting: Obstetrics and Gynecology

## 2012-05-12 ENCOUNTER — Ambulatory Visit (INDEPENDENT_AMBULATORY_CARE_PROVIDER_SITE_OTHER): Payer: BC Managed Care – PPO | Admitting: Obstetrics and Gynecology

## 2012-05-12 VITALS — BP 165/92 | HR 105 | Ht 59.0 in | Wt 172.0 lb

## 2012-05-12 DIAGNOSIS — N950 Postmenopausal bleeding: Secondary | ICD-10-CM

## 2012-05-12 NOTE — Progress Notes (Signed)
  Subjective:    Patient ID: Martha Hamilton, female    DOB: [birth year], 61 y.o.   MRN: [id number]  HPI  61 yo postmenopausal for 10 years with BMI 34 presenting today for evaluation of postmenopausal vaginal bleeding x 1 month. Patient states that she experienced light bleeding with cramping pain for 3 days. She required the use of 3 panty liner per day for the vaginal bleeding. She continues to have occasional cramping pains.   Past Medical History  Diagnosis Date  . Breast cancer   . Allergic rhinitis   . DM2 (diabetes mellitus, type 2)   . Hypertension   . Osteoarthritis     hands  . Obesity    History reviewed. No pertinent past surgical history.  History  Substance Use Topics  . Smoking status: Never Smoker   . Smokeless tobacco: Never Used  . Alcohol Use: No   Family History  Problem Relation Age of Onset  . Diabetes Mother   . Stroke Father      Review of Systems  All other systems reviewed and are negative.       Objective:   Physical Exam Pelvic:  Normal external female genitalia. Vagina is pink and rugated.  Normal discharge. Normal appearing cervix. Uterus is normal in size.  No adnexal mass or tenderness.      Assessment & Plan:  61 yo with postmenopausal vaginal bleeding - Pelvic ultrasound was ordered - ENDOMETRIAL BIOPSY     The indications for endometrial biopsy were reviewed.   Risks of the biopsy including cramping, bleeding, infection, uterine perforation, inadequate specimen and need for additional procedures  were discussed. The patient states she understands and agrees to undergo procedure today. Consent was signed. Time out was performed. Urine HCG was negative. A sterile speculum was placed in the patient's vagina and the cervix was prepped with Betadine. A single-toothed tenaculum was placed on the anterior lip of the cervix to stabilize it. The uterine cavity was sounded to a depth of 7 cm using the uterine sound. The 3 mm pipelle was introduced  into the endometrial cavity without difficulty, 3 passes were made.  A  scant amount of tissue was sent to pathology. The instruments were removed from the patient's vagina. Minimal bleeding from the cervix was noted. The patient tolerated the procedure well.  Routine post-procedure instructions were given to the patient. The patient will follow up in two weeks to review the results and for further management.

## 2012-05-13 ENCOUNTER — Other Ambulatory Visit: Payer: Self-pay | Admitting: Family Medicine

## 2012-05-24 ENCOUNTER — Ambulatory Visit (HOSPITAL_COMMUNITY)
Admission: RE | Admit: 2012-05-24 | Discharge: 2012-05-24 | Disposition: A | Discharge status: Home / Self Care | Payer: BC Managed Care – PPO | Source: Ambulatory Visit | Attending: Obstetrics and Gynecology | Admitting: Obstetrics and Gynecology

## 2012-05-24 DIAGNOSIS — N950 Postmenopausal bleeding: Secondary | ICD-10-CM

## 2012-05-24 DIAGNOSIS — R9389 Abnormal findings on diagnostic imaging of other specified body structures: Secondary | ICD-10-CM | POA: Insufficient documentation

## 2012-05-31 ENCOUNTER — Ambulatory Visit (INDEPENDENT_AMBULATORY_CARE_PROVIDER_SITE_OTHER): Payer: BC Managed Care – PPO | Admitting: Obstetrics and Gynecology

## 2012-05-31 ENCOUNTER — Encounter: Payer: Self-pay | Admitting: Obstetrics and Gynecology

## 2012-05-31 VITALS — BP 152/89 | HR 104 | Ht 59.0 in | Wt 171.0 lb

## 2012-05-31 DIAGNOSIS — N950 Postmenopausal bleeding: Secondary | ICD-10-CM

## 2012-05-31 NOTE — Addendum Note (Signed)
Addended by: Catalina Antigua on: 05/31/2012 02:09 PM   Modules accepted: Orders

## 2012-05-31 NOTE — Progress Notes (Signed)
Patient ID: SHONNA DEITER, female   DOB: 01/18/1951, 61 y.o.   MRN: [id number] 61 yo G0 with postmenopausal vaginal bleeding presented  Today to discuss results of endometrial biopsy and pelvic ultrasound.  05/2012 ultrasound:  IMPRESSION:  1. Abnormal post menopausal endometrial thickness measuring 13 mm.  In the setting of post-menopausal bleeding, endometrial sampling is  indicated to exclude carcinoma. If results are benign,  sonohysterogram should be considered for focal lesion work-up.  (Ref: Radiological Reasoning: Algorithmic Workup of Abnormal  Vaginal Bleeding with Endovaginal Sonography and Sonohysterography.  AJR 2008; [id number])  2. Normal postmenopausal appearance of both ovaries.   10/30 EMBx:  - POLYPOID FRAGMENT OF PROLIFERATIVE PATTERN ENDOMETRIUM. - BENIGN ENDOCERVICAL TYPE POLYP. - NO HYPERPLASIA, ATYPIA OR MALIGNANCY IDENTIFIED.  Results were reviewed and explained to the patient.  A/P 61 yo with postmenopausal bleeding likely due to endometrial polyp - Discussed need for D&C polypectomy. Risks, benefits and alternatives were explained. All questions were answered. Patient will be contacted with surgical scheduler with date and time of procedure.

## 2012-06-04 ENCOUNTER — Other Ambulatory Visit: Payer: Self-pay | Admitting: *Deleted

## 2012-06-04 MED ORDER — OMEPRAZOLE 20 MG PO CPDR: 20.0000 mg | DELAYED_RELEASE_CAPSULE | Freq: Every day | ORAL | Status: DC

## 2012-06-07 ENCOUNTER — Other Ambulatory Visit: Payer: Self-pay | Admitting: Family Medicine

## 2012-07-05 ENCOUNTER — Telehealth: Payer: Self-pay | Admitting: Family Medicine

## 2012-07-05 MED ORDER — OMEPRAZOLE 20 MG PO CPDR: 20.0000 mg | DELAYED_RELEASE_CAPSULE | Freq: Every day | ORAL | Status: DC

## 2012-07-05 MED ORDER — LOSARTAN POTASSIUM 100 MG PO TABS: 100.0000 mg | ORAL_TABLET | Freq: Every day | ORAL | Status: DC

## 2012-07-05 MED ORDER — AMITRIPTYLINE HCL 25 MG PO TABS: 25.0000 mg | ORAL_TABLET | Freq: Every day | ORAL | Status: DC

## 2012-07-05 NOTE — Telephone Encounter (Signed)
[facility]eds [facility] refils-[facility]

## 2012-07-19 ENCOUNTER — Encounter (HOSPITAL_COMMUNITY): Payer: Self-pay | Admitting: Pharmacist

## 2012-07-26 ENCOUNTER — Encounter (HOSPITAL_COMMUNITY): Payer: Self-pay

## 2012-07-26 ENCOUNTER — Encounter (HOSPITAL_COMMUNITY)
Admission: RE | Admit: 2012-07-26 | Discharge: 2012-07-26 | Disposition: A | Discharge status: Home / Self Care | Payer: BC Managed Care – PPO | Source: Ambulatory Visit | Attending: Obstetrics and Gynecology | Admitting: Obstetrics and Gynecology

## 2012-07-26 ENCOUNTER — Telehealth: Payer: Self-pay | Admitting: Family Medicine

## 2012-07-26 DIAGNOSIS — Z01818 Encounter for other preprocedural examination: Secondary | ICD-10-CM | POA: Insufficient documentation

## 2012-07-26 DIAGNOSIS — Z01812 Encounter for preprocedural laboratory examination: Secondary | ICD-10-CM | POA: Insufficient documentation

## 2012-07-26 HISTORY — DX: Gastro-esophageal reflux disease without esophagitis: K21.9

## 2012-07-26 LAB — BASIC METABOLIC PANEL
CO2: 26 mEq/L (ref 19–32)
Calcium: 9.7 mg/dL (ref 8.4–10.5)
Creatinine, Ser: 0.5 mg/dL (ref 0.50–1.10)
GFR calc non Af Amer: >90 mL/min (ref >90)
Sodium: 139 mEq/L (ref 135–145)

## 2012-07-26 LAB — CBC
MCH: 27.6 pg (ref 26.0–34.0)
MCHC: 32.9 g/dL (ref 30.0–36.0)
MCV: 84 fL (ref 78.0–100.0)
Platelets: 398 10*3/uL (ref 150–400)
RDW: 13.9 % (ref 11.5–15.5)

## 2012-07-26 NOTE — Patient Instructions (Addendum)
Martha Hamilton  07/26/2012   Your procedure is scheduled on:  07/27/12  Enter through the Main Entrance of [facility] at 930 AM.  Pick up the phone at the desk and dial [phone].   Call this number if you have problems the morning of surgery: [phone]   Remember:   Do not eat food:After Midnight.  Do not drink clear liquids: After Midnight.  Take these medicines the morning of surgery with A SIP OF WATER: blood pressure medications and Prilosec, hold Metformin for 25yrs.   Do not wear jewelry, make-up or nail polish.  Do not wear lotions, powders, or perfumes. You may wear deodorant.  Do not shave 48 hours prior to surgery.  Do not bring valuables to the hospital.  Contacts, dentures or bridgework may not be worn into surgery.  Leave suitcase in the car. After surgery it may be brought to your room.  For patients admitted to the hospital, checkout time is 11:00 AM the day of discharge.   Patients discharged the day of surgery will not be allowed to drive home.  Name and phone number of your driver: sister in law     Martha Hamilton  Special Instructions: Shower using CHG 2 nights before surgery and the night before surgery.  If you shower the day of surgery use CHG.  Use special wash - you have one bottle of CHG for all showers.  You should use approximately 1/3 of the bottle for each shower.   Please read over the following fact sheets that you were given: Surgical Site Infection Prevention

## 2012-07-26 NOTE — Pre-Procedure Instructions (Signed)
EKG and history reviewed with Brayton Caves, MD, surgery to be canceled until pt receives cardiac clearance. Pt informed, Dr Deretha Emory office informed Lorelle Formosa).

## 2012-07-26 NOTE — Telephone Encounter (Signed)
Let pt know that she has to see a cardiologist before she can have surgery and called the [facility] hospital an left a voicemail with pre-opt dpt letting them know that we don't see a EKG in EPIC for this pt

## 2012-07-26 NOTE — Telephone Encounter (Signed)
Pt called and states that she went for preop appt at [facility] this morning and that "something came up" on her EKG.  They informed pt that she needs for her physican to look at the EKG (in Epic) and approve her surgery before they will move forward with it.  Pt is scheduled for procedure tomorrow.

## 2012-07-26 NOTE — Telephone Encounter (Signed)
Pt called to check to see if you have looked at EKG, I advise pt that you are still seeing pt but once you are done you will look at it and decide if she still can go forward with surgery or not

## 2012-07-26 NOTE — Telephone Encounter (Signed)
I do not see EKG in the electronic chart -- ? Am I missing it somewhere I did get a flag from the PA today that she would need to see cardiologist before she could have surgery - let me know if she needs that referral

## 2012-07-27 ENCOUNTER — Telehealth: Payer: Self-pay | Admitting: Family Medicine

## 2012-07-27 ENCOUNTER — Ambulatory Visit (HOSPITAL_COMMUNITY)
Admission: RE | Admit: 2012-07-27 | Payer: BC Managed Care – PPO | Source: Ambulatory Visit | Admitting: Obstetrics and Gynecology

## 2012-07-27 DIAGNOSIS — Z01818 Encounter for other preprocedural examination: Secondary | ICD-10-CM

## 2012-07-27 DIAGNOSIS — R9431 Abnormal electrocardiogram [ECG] [EKG]: Secondary | ICD-10-CM

## 2012-07-27 SURGERY — DILATATION AND CURETTAGE /HYSTEROSCOPY
Anesthesia: Choice | Site: Vagina

## 2012-07-27 NOTE — Telephone Encounter (Signed)
Message copied by [name] on Tue Jul 27, 2012  9:06 PM ------      Message from: Shon Millet      Created: Tue Jul 27, 2012  4:52 PM       Please put cardiologist referral in, pt called Shirlee Limerick checking on status and Shirlee Limerick said she hadn't received referral yet, pt's surgery set for today was canceled because they said she needed to follow up with Cardiologist, I have called [facility] 3x and even left a detailed message asking if pt had a EKG and no one has ever returned my calls

## 2012-07-28 ENCOUNTER — Other Ambulatory Visit: Payer: BC Managed Care – PPO

## 2012-07-28 NOTE — Telephone Encounter (Signed)
They pt EKG in EPIC and Shirlee Limerick is scheduling appt with cardio

## 2012-07-28 NOTE — Telephone Encounter (Signed)
Patient will see Dr Antoine Poche tomorrow 07/29/12 at 11am in [place] and patient aware of her appt.

## 2012-07-29 ENCOUNTER — Encounter: Payer: Self-pay | Admitting: Cardiology

## 2012-07-29 ENCOUNTER — Ambulatory Visit (INDEPENDENT_AMBULATORY_CARE_PROVIDER_SITE_OTHER): Payer: BC Managed Care – PPO | Admitting: Cardiology

## 2012-07-29 VITALS — BP 161/90 | HR 110 | Ht 59.0 in | Wt 168.0 lb

## 2012-07-29 DIAGNOSIS — Z01810 Encounter for preprocedural cardiovascular examination: Secondary | ICD-10-CM

## 2012-07-29 MED ORDER — HYDROCHLOROTHIAZIDE 12.5 MG PO CAPS: 12.5000 mg | ORAL_CAPSULE | Freq: Every day | ORAL | Status: DC

## 2012-07-29 NOTE — Patient Instructions (Addendum)
Please start Hydrochlorothiazide 12.5 mg a day. Continue all other medications as listed.  Your physician has requested that you have an exercise tolerance test. For further information please visit [email or web address] Please also follow instruction sheet, as given.

## 2012-07-29 NOTE — Progress Notes (Signed)
HPI Patient presents for preoperative evaluation. She is to have a D&C. She has not had any prior cardiac history but she does have risk factors. She was noted apparently to have an abnormal EKG and preop evaluation and so surgery was canceled. She's here today for this evaluation. She denies any cardiovascular symptoms. The patient denies any new symptoms such as chest discomfort, neck or arm discomfort. There has been no new shortness of breath, PND or orthopnea. There have been no reported palpitations, presyncope or syncope. [place]. 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 She has been slightly limited by shoulder and back pain. However, she's not had any symptoms with her routine chores. I do note that her hemoglobin A1c has slowly crept up over time and that her blood pressure has not been at target. I reviewed previous labs and vitals.  Allergies  Allergen Reactions  . Atorvastatin Other (See Comments)    Muscle cramping  . Cholestyramine     REACTION: unknown  . Niacin Nausea And Vomiting    Current Outpatient Prescriptions  Medication Sig Dispense Refill  . ACCU-CHEK SOFTCLIX LANCETS lancets       . acetaminophen (TYLENOL) 500 MG tablet Take 500 mg by mouth every 6 (six) hours as needed.      Marland Kitchen amitriptyline (ELAVIL) 25 MG tablet Take 1 tablet (25 mg total) by mouth at bedtime.  90 tablet  3  . aspirin 81 MG tablet Take 81 mg by mouth daily.        . Cholecalciferol (VITAMIN D) 1000 UNITS capsule Take 2,000 Units by mouth daily.       . diclofenac sodium (VOLTAREN) 1 % GEL Apply 1 application topically as needed. For arthritis in finger      . fenofibrate (TRICOR) 145 MG tablet TAKE 1 TABLET DAILY  90 tablet  3  . Glucosamine-MSM-Hyaluronic Acd (JOINT HEALTH) 750-375-30 MG TABS Take 2 tablets by mouth daily.      Marland Kitchen glucose blood (ACCU-CHEK COMPACT TEST DRUM) test strip Use to test sugar once daily  100 each  3  . losartan (COZAAR) 100 MG tablet  Take 1 tablet (100 mg total) by mouth daily.  90 tablet  3  . metFORMIN (GLUCOPHAGE) 500 MG tablet TAKE 1 TABLET TWICE A DAY WITH BREAKFAST AND EVENING MEAL  180 tablet  2  . Omega-3 Fatty Acids (FISH OIL) 1200 MG CAPS Take 1 capsule by mouth daily.      Marland Kitchen omeprazole (PRILOSEC) 20 MG capsule Take 1 capsule (20 mg total) by mouth daily.  90 capsule  3  . OVER THE COUNTER MEDICATION Vitalizer Gold without Vitamin  K taking one group of supplement daily.       Marland Kitchen SALINE NASAL SPRAY NA Place 1 spray into the nose daily.       . simvastatin (ZOCOR) 40 MG tablet Take 1 tablet (40 mg total) by mouth at bedtime.  90 tablet  3    Past Medical History  Diagnosis Date  . Allergic rhinitis   . DM2 (diabetes mellitus, type 2)   . Hypertension   . Osteoarthritis     hands  . Obesity   . Breast cancer   . GERD (gastroesophageal reflux disease)     Past Surgical History  Procedure Date  . Gastric restriction surgery     for reflux  . Appendectomy 1976  . Breast surgery     right breast, lumpectomy  w/nodes    Family History  Problem Relation Age of Onset  . Diabetes Mother   . Stroke Father     History   Social History  . Marital Status: Single    Spouse Name: N/A    Number of Children: N/A  . Years of Education: N/A   Occupational History  . Not on file.   Social History Main Topics  . Smoking status: Never Smoker   . Smokeless tobacco: Never Used  . Alcohol Use: No  . Drug Use: No  . Sexually Active: Not Currently    Birth Control/ Protection: Post-menopausal   Other Topics Concern  . Not on file   Social History Narrative   Lives with, and cares for her elderly mother.    ROS:  Positive for frequent headaches, dizziness, vertigo, cough, reflux, nocturia, increased thirst, muscle cramping, joint pains. Otherwise as stated in the HPI and negative for all other systems.  PHYSICAL EXAM BP 161/90  Pulse 110  Ht 4\' 11"  (1.499 m)  Wt 168 lb (76.204 kg)  BMI 33.93 kg/m2   LMP 06/13/2001 GENERAL:  Well appearing HEENT:  Pupils equal round and reactive, fundi not visualized, oral mucosa unremarkable NECK:  No jugular venous distention, waveform within normal limits, carotid upstroke brisk and symmetric, no bruits, no thyromegaly LYMPHATICS:  No cervical, inguinal adenopathy LUNGS:  Clear to auscultation bilaterally BACK:  No CVA tenderness CHEST:  Unremarkable HEART:  PMI not displaced or sustained,S1 and S2 within normal limits, no S3, no S4, no clicks, no rubs, no murmurs ABD:  Flat, positive bowel sounds normal in frequency in pitch, no bruits, no rebound, no guarding, no midline pulsatile mass, no hepatomegaly, no splenomegaly EXT:  2 plus pulses throughout, no edema, no cyanosis no clubbing SKIN:  No rashes no nodules NEURO:  Cranial nerves II through XII grossly intact, motor grossly intact throughout PSYCH:  Cognitively intact, oriented to person place and time  EKG:  Sinus rhythm, rate 93, axis within normal limits, intervals within normal limits, RSR prime V1 and V2, no acute ST-T wave changes. 07/29/2012  ASSESSMENT AND PLAN   Preoperative examination - I do not see any high-risk findings. She has a reasonable functional level. She is going for a surgery that is not high risk from a cardiovascular standpoint. Therefore accordingly she would be at acceptable risk for the planned surgery. However, I will screen her with an exercise treadmill test as a routine given her risk factors. This is described below. Since that would plan on doing this anyway I will get this done before surgery as another component of her preoperative evaluation.  Hypertension Her blood pressure is not at target for diabetes. I will add HCTZ 12.5 mg daily.  Overweight We talked about this and she will be given her specific instructions about exercise at the time of her treadmill.  Risk reduction I will bring the patient back for a POET (Plain Old Exercise Test). This will  allow me to screen for obstructive coronary disease, risk stratify and very importantly provide a prescription for exercise.

## 2012-08-04 ENCOUNTER — Ambulatory Visit: Payer: BC Managed Care – PPO | Admitting: Family Medicine

## 2012-08-05 ENCOUNTER — Other Ambulatory Visit (INDEPENDENT_AMBULATORY_CARE_PROVIDER_SITE_OTHER): Payer: BC Managed Care – PPO

## 2012-08-05 DIAGNOSIS — N950 Postmenopausal bleeding: Secondary | ICD-10-CM

## 2012-08-05 DIAGNOSIS — E119 Type 2 diabetes mellitus without complications: Secondary | ICD-10-CM

## 2012-08-05 LAB — CBC WITH DIFFERENTIAL/PLATELET
Eosinophils Relative: 4 % (ref 0.0–5.0)
HCT: 36.3 % (ref 36.0–46.0)
Hemoglobin: 12 g/dL (ref 12.0–15.0)
Lymphs Abs: 2.4 10*3/uL (ref 0.7–4.0)
Monocytes Relative: 8.2 % (ref 3.0–12.0)
Neutro Abs: 3.5 10*3/uL (ref 1.4–7.7)
WBC: 6.8 10*3/uL (ref 4.5–10.5)

## 2012-08-11 ENCOUNTER — Ambulatory Visit: Payer: BC Managed Care – PPO | Admitting: Family Medicine

## 2012-08-13 ENCOUNTER — Ambulatory Visit (INDEPENDENT_AMBULATORY_CARE_PROVIDER_SITE_OTHER): Payer: BC Managed Care – PPO | Admitting: Family Medicine

## 2012-08-13 ENCOUNTER — Encounter: Payer: Self-pay | Admitting: Family Medicine

## 2012-08-13 VITALS — BP 128/85 | HR 110 | Temp 97.9°F | Ht 59.5 in | Wt 167.8 lb

## 2012-08-13 DIAGNOSIS — I10 Essential (primary) hypertension: Secondary | ICD-10-CM

## 2012-08-13 DIAGNOSIS — E119 Type 2 diabetes mellitus without complications: Secondary | ICD-10-CM

## 2012-08-13 MED ORDER — AMITRIPTYLINE HCL 25 MG PO TABS: 25.0000 mg | ORAL_TABLET | Freq: Every day | ORAL | Status: DC

## 2012-08-13 NOTE — Progress Notes (Signed)
Subjective:    Patient ID: Martha Hamilton, female    DOB: 1951/02/24, 62 y.o.   MRN: [id number]  HPI Here for f/u of chronic medical problems   Is doing well overall  She still has not had her surgery yet - and she had an abnormal ekg at preop and had to see cardiology She saw Dr Antoine Poche - he thought she should have a stress test even though he thought ekg was ok  Has a lot of heart dz in family    bp is stable today  No cp or palpitations or headaches or edema  No side effects to medicines - cardiologist added a little microzide - it has increased her urination- she is doing well  BP Readings from Last 3 Encounters:  08/13/12 142/94  07/29/12 161/90  07/26/12 173/88     Diabetes Home sugar results - are up just a little bit  120s-130s twice daily DM diet -she has been very good with her diet (no sweets at all) and wt is stable  Exercise - started working out with the Wii Symptoms-none  A1C last  Lab Results  Component Value Date   HGBA1C 7.2* 08/05/2012    No problems with medications  Renal protection- is on ARB  Last eye exam - up to date     Wt is stable with bmi of 33   Patient Active Problem List  Diagnosis  . DIABETES MELLITUS, TYPE II  . HYPERLIPIDEMIA  . HYPERTENSION, ESSENTIAL NOS  . ALLERGIC RHINITIS  . OSTEOARTHRITIS, HANDS, BILATERAL  . Right shoulder pain  . Routine general medical examination at a health care facility  . Special screening for malignant neoplasms, colon  . Gynecological examination  . Parotiditis  . Post-menopausal bleeding  . Abnormal EKG  . Preoperative clearance   Past Medical History  Diagnosis Date  . Allergic rhinitis   . DM2 (diabetes mellitus, type 2)   . Hypertension   . Osteoarthritis     hands  . Obesity   . Breast cancer   . GERD (gastroesophageal reflux disease)    Past Surgical History  Procedure Date  . Gastric restriction surgery     for reflux  . Appendectomy 1976  . Breast surgery     right breast,  lumpectomy w/nodes   History  Substance Use Topics  . Smoking status: Never Smoker   . Smokeless tobacco: Never Used  . Alcohol Use: No   Family History  Problem Relation Age of Onset  . Diabetes Mother   . Stroke Father   . CAD Father 90    Died with MI  . Heart disease Mother     Pacemaker, CHF   Allergies  Allergen Reactions  . Atorvastatin Other (See Comments)    Muscle cramping  . Cholestyramine     REACTION: unknown  . Niacin Nausea And Vomiting   Current Outpatient Prescriptions on File Prior to Visit  Medication Sig Dispense Refill  . ACCU-CHEK SOFTCLIX LANCETS lancets       . acetaminophen (TYLENOL) 500 MG tablet Take 500 mg by mouth every 6 (six) hours as needed.      Marland Kitchen amitriptyline (ELAVIL) 25 MG tablet Take 1 tablet (25 mg total) by mouth at bedtime.  90 tablet  3  . aspirin 81 MG tablet Take 81 mg by mouth daily.        . Cholecalciferol (VITAMIN D) 1000 UNITS capsule Take 2,000 Units by mouth daily.       Marland Kitchen  diclofenac sodium (VOLTAREN) 1 % GEL Apply 1 application topically as needed. For arthritis in finger      . fenofibrate (TRICOR) 145 MG tablet TAKE 1 TABLET DAILY  90 tablet  3  . Glucosamine-MSM-Hyaluronic Acd (JOINT HEALTH) 750-375-30 MG TABS Take 2 tablets by mouth daily.      Marland Kitchen glucose blood (ACCU-CHEK COMPACT TEST DRUM) test strip Use to test sugar once daily  100 each  3  . hydrochlorothiazide (MICROZIDE) 12.5 MG capsule Take 1 capsule (12.5 mg total) by mouth daily.  90 capsule  3  . losartan (COZAAR) 100 MG tablet Take 1 tablet (100 mg total) by mouth daily.  90 tablet  3  . metFORMIN (GLUCOPHAGE) 500 MG tablet TAKE 1 TABLET TWICE A DAY WITH BREAKFAST AND EVENING MEAL  180 tablet  2  . Omega-3 Fatty Acids (FISH OIL) 1200 MG CAPS Take 1 capsule by mouth daily.      Marland Kitchen omeprazole (PRILOSEC) 20 MG capsule Take 1 capsule (20 mg total) by mouth daily.  90 capsule  3  . OVER THE COUNTER MEDICATION Vitalizer Gold without Vitamin  K taking one group of  supplement daily.       Marland Kitchen SALINE NASAL SPRAY NA Place 1 spray into the nose daily.       . simvastatin (ZOCOR) 40 MG tablet Take 1 tablet (40 mg total) by mouth at bedtime.  90 tablet  3    Review of Systems Review of Systems  Constitutional: Negative for fever, appetite change, fatigue and unexpected weight change.  Eyes: Negative for pain and visual disturbance.  Respiratory: Negative for cough and shortness of breath.   Cardiovascular: Negative for cp or palpitations    Gastrointestinal: Negative for nausea, diarrhea and constipation.  Genitourinary: Negative for urgency and frequency.  Skin: Negative for pallor or rash   Neurological: Negative for weakness, light-headedness, numbness and headaches.  Hematological: Negative for adenopathy. Does not bruise/bleed easily.  Psychiatric/Behavioral: Negative for dysphoric mood. The patient is not nervous/anxious.         Objective:   Physical Exam  Constitutional: She appears well-developed and well-nourished. No distress.       obese and well appearing   HENT:  Head: Normocephalic and atraumatic.  Mouth/Throat: Oropharynx is clear and moist.  Eyes: Conjunctivae normal and EOM are normal. Pupils are equal, round, and reactive to light. No scleral icterus.  Neck: Normal range of motion. Neck supple. No JVD present. No thyromegaly present.  Cardiovascular: Normal rate, regular rhythm, normal heart sounds and intact distal pulses.  Exam reveals no gallop.   Pulmonary/Chest: Breath sounds normal. No respiratory distress. She has no wheezes. She has no rales.  Abdominal: Soft. Bowel sounds are normal. She exhibits no distension and no mass.  Musculoskeletal: She exhibits no edema.  Lymphadenopathy:    She has no cervical adenopathy.  Neurological: She is alert. She has normal reflexes. No cranial nerve deficit. She exhibits normal muscle tone. Coordination normal.  Skin: Skin is warm and dry. No rash noted. No pallor.  Psychiatric: She  has a normal mood and affect.          Assessment & Plan:

## 2012-08-13 NOTE — Patient Instructions (Addendum)
Start to check your blood sugar at different times - just to see if there is a peak that we are missing Keep working on diet and weight loss Have fun with exercise  Follow up with me in 6 months for annual exam with labs prior

## 2012-08-13 NOTE — Assessment & Plan Note (Signed)
With the addition of small dose of hctz is doing better  2nd check at goal  For stress test upcoming before her gyn surg If all is well will f/u with me for annual exam in 6 mo

## 2012-08-13 NOTE — Assessment & Plan Note (Signed)
Lab Results  Component Value Date   HGBA1C 7.2* 08/05/2012    This is stable Home sugars are all 120s-130s- I enc her to check at some odd times to see if it is peaking at night or mid day  Rev low glycemic diet  Due opthy-will schedule that herself  For stress test upcoming Is going to stick with the Wii for exercise-good choice

## 2012-08-16 ENCOUNTER — Ambulatory Visit (INDEPENDENT_AMBULATORY_CARE_PROVIDER_SITE_OTHER): Payer: BC Managed Care – PPO | Admitting: Physician Assistant

## 2012-08-16 DIAGNOSIS — Z01810 Encounter for preprocedural cardiovascular examination: Secondary | ICD-10-CM

## 2012-08-16 DIAGNOSIS — R9439 Abnormal result of other cardiovascular function study: Secondary | ICD-10-CM

## 2012-08-16 NOTE — Patient Instructions (Addendum)
Your physician has requested that you have a lexiscan myoview. For further information please visit [email or web address] Please follow instruction sheet, as given.  CHECK BLOOD PRESSURE ONE TIME DAILY; CALL [phone] SCOTT WEAVER, PAC AT THE END OF THE WEEK WITH READINGS

## 2012-08-16 NOTE — Procedures (Signed)
Exercise Treadmill Test  Pre-Exercise Testing Evaluation Rhythm: sinus tachycardia  Rate: 112     Test  Exercise Tolerance Test Ordering MD: Angelina Sheriff, MD  Interpreting MD: Tereso Newcomer PA-C  Unique Test No: 1   Treadmill:  1  Indication for ETT: PRE -OPERATIVE CARDIOVASCULAR DISEASE  Contraindication to ETT: No   Stress Modality: exercise - treadmill  Cardiac Imaging Performed: non   Protocol: standard Bruce - maximal  Max BP:  216/86  Max MPHR (bpm):  159 85% MPR (bpm):  135  MPHR obtained (bpm):  162 % MPHR obtained:  101%  Reached 85% MPHR (min:sec):  1:30 Total Exercise Time (min-sec):  7:00  Workload in METS:  8.5 Borg Scale: 17  Reason ETT Terminated:  patient's desire to stop    ST Segment Analysis At Rest: non-specific ST segment slurring With Exercise: borderline ST changes  Other Information Arrhythmia:  No Angina during ETT:  absent (0) Quality of ETT:  indeterminate  ETT Interpretation:  borderline (indeterminate) with non-specific ST changes  Comments: Fair exercise tolerance. No chest pain. Normal BP response to exercise. There were borderline ST changes with exercise.  Cannot rule out ischemia.  Recommendations: With hx of diabetes and borderline ECG changes, recommend Lexiscan Myoview. I will schedule her for this test. Patient's BP was high pre-test.  HCTZ just started 2 weeks ago.  She will record BPs over the next week and call in to me. If BP remains high, add Metoprolol to regimen. Signed,  Tereso Newcomer, PA-C  3:32 PM 08/16/2012

## 2012-08-20 ENCOUNTER — Telehealth: Payer: Self-pay | Admitting: Physician Assistant

## 2012-08-20 NOTE — Telephone Encounter (Signed)
BP readings, 2-3 137/77 , 2-4 143/73, 2-5- 142/75, 2-6- 134/71, and 2-7 137/74 pls call [phone]

## 2012-08-20 NOTE — Telephone Encounter (Signed)
Martha Hamilton 08/20/2012 12:10 PM Signed  BP readings, 2-3 137/77 , 2-4 143/73, 2-5- 142/75, 2-6- 134/71, and 2-7 137/74 pls call [phone]

## 2012-08-24 ENCOUNTER — Ambulatory Visit (HOSPITAL_COMMUNITY): Payer: BC Managed Care – PPO | Attending: Cardiology | Admitting: Radiology

## 2012-08-24 VITALS — BP 142/85 | Ht 59.0 in | Wt 166.0 lb

## 2012-08-24 DIAGNOSIS — I10 Essential (primary) hypertension: Secondary | ICD-10-CM | POA: Insufficient documentation

## 2012-08-24 DIAGNOSIS — R109 Unspecified abdominal pain: Secondary | ICD-10-CM | POA: Insufficient documentation

## 2012-08-24 DIAGNOSIS — E785 Hyperlipidemia, unspecified: Secondary | ICD-10-CM | POA: Insufficient documentation

## 2012-08-24 DIAGNOSIS — R0989 Other specified symptoms and signs involving the circulatory and respiratory systems: Secondary | ICD-10-CM

## 2012-08-24 DIAGNOSIS — J45909 Unspecified asthma, uncomplicated: Secondary | ICD-10-CM | POA: Insufficient documentation

## 2012-08-24 DIAGNOSIS — R51 Headache: Secondary | ICD-10-CM | POA: Insufficient documentation

## 2012-08-24 DIAGNOSIS — E119 Type 2 diabetes mellitus without complications: Secondary | ICD-10-CM | POA: Insufficient documentation

## 2012-08-24 DIAGNOSIS — R0609 Other forms of dyspnea: Secondary | ICD-10-CM | POA: Insufficient documentation

## 2012-08-24 DIAGNOSIS — R0602 Shortness of breath: Secondary | ICD-10-CM | POA: Insufficient documentation

## 2012-08-24 DIAGNOSIS — Z01810 Encounter for preprocedural cardiovascular examination: Secondary | ICD-10-CM

## 2012-08-24 DIAGNOSIS — R9439 Abnormal result of other cardiovascular function study: Secondary | ICD-10-CM

## 2012-08-24 MED ORDER — TECHNETIUM TC 99M SESTAMIBI GENERIC - CARDIOLITE
10.0000 | Freq: Once | INTRAVENOUS | Status: AC | PRN
  Administered 2012-08-24: 10 via INTRAVENOUS

## 2012-08-24 MED ORDER — REGADENOSON 0.4 MG/5ML IV SOLN
0.4000 mg | Freq: Once | INTRAVENOUS | Status: AC
  Administered 2012-08-24: 0.4 mg via INTRAVENOUS

## 2012-08-24 MED ORDER — TECHNETIUM TC 99M SESTAMIBI GENERIC - CARDIOLITE
30.0000 | Freq: Once | INTRAVENOUS | Status: AC | PRN
  Administered 2012-08-24: 30 via INTRAVENOUS

## 2012-08-24 NOTE — Progress Notes (Signed)
[facility] SITE 3 NUCLEAR MED [address] [place], [place] [address] [phone]    Cardiology Nuclear Med Study  Martha Hamilton is a 62 y.o. female     MRN : [id number]     DOB: 04/10/1951  Procedure Date: 08/24/2012  Nuclear Med Background Indication for Stress Test:  Evaluation for Ischemia, Pending Surgical Clearance for  Gynecological surgery by Dr Catalina Antigua, and 08-16-12 Abnormal GXT History:  Asthma and 08/16/2012 GXT: Borderline changes  Cardiac Risk Factors: Hypertension, Lipids and NIDDM  Symptoms:  DOE   Nuclear Pre-Procedure Caffeine/Decaff Intake:  None > 12 hrs NPO After: 9:30pm   Lungs:  clear O2 Sat: 95% on room air. IV 0.9% NS with Angio Cath:  22g  IV Site: L Forearm x 1, tolerated well IV Started by:  Irean Hong, RN  Chest Size (in):  38 Cup Size: C  Height: 4\' 11"  (1.499 m)  Weight:  166 lb (75.297 kg)  BMI:  Body mass index is 33.51 kg/(m^2). Tech Comments:  Took Losartan this am, and held metformin today    Nuclear Med Study 1 or 2 day study: 1 day  Stress Test Type:  Lexiscan  Reading MD: Cassell Clement, MD  Order Authorizing Provider:  Rollene Rotunda, MD, and Tereso Newcomer, [facility]  Resting Radionuclide: Technetium 45m Sestamibi  Resting Radionuclide Dose: 10.9 mCi   Stress Radionuclide:  Technetium 65m Sestamibi  Stress Radionuclide Dose: 33.0 mCi           Stress Protocol Rest HR: 94 Stress HR: 118  Rest BP: 142/85 Stress BP: 143/63  Exercise Time (min): n/a METS: n/a   Predicted Max HR: 159 bpm % Max HR: 74.21 bpm Rate Pressure Product: 62952   Dose of Adenosine (mg):  n/a Dose of Lexiscan: 0.4 mg  Dose of Atropine (mg): n/a Dose of Dobutamine: n/a mcg/kg/min (at max HR)  Stress Test Technologist: Milana Na, EMT-P  Nuclear Technologist:  Domenic Polite, CNMT     Rest Procedure:  Myocardial perfusion imaging was performed at rest 45 minutes following the intravenous administration of Technetium 11m Sestamibi. Rest  ECG: NSR - Normal EKG  Stress Procedure:  The patient received IV Lexiscan 0.4 mg over 15-seconds. This patient had sob, abdominal pain, and headache with Lexiscan. Technetium 16m Sestamibi injected at 30-seconds.  Quantitative spect images were obtained after a 45 minute delay. Stress ECG: No significant change from baseline ECG  QPS Raw Data Images:  Normal; no motion artifact; normal heart/lung ratio. Stress Images:  Normal homogeneous uptake in all areas of the myocardium. Rest Images:  Normal homogeneous uptake in all areas of the myocardium. Subtraction (SDS):  No evidence of ischemia. Transient Ischemic Dilatation (Normal <1.22):  0.89 Lung/Heart Ratio (Normal <0.45):  0.29  Quantitative Gated Spect Images QGS EDV:  64 ml QGS ESV:  20 ml  Impression Exercise Capacity:  Lexiscan with low level exercise. BP Response:  Normal blood pressure response. Clinical Symptoms:  No significant symptoms noted. ECG Impression:  No significant ST segment change suggestive of ischemia. Comparison with Prior Nuclear Study: No images to compare  Overall Impression:  Normal stress nuclear study.  LV Ejection Fraction: 69%.  LV Wall Motion:  NL LV Function; NL Wall Motion  [employer]

## 2012-08-25 ENCOUNTER — Encounter: Payer: Self-pay | Admitting: Physician Assistant

## 2012-08-30 ENCOUNTER — Telehealth: Payer: Self-pay | Admitting: *Deleted

## 2012-08-30 NOTE — Telephone Encounter (Signed)
pt notified about normal stress test results with verbal understanding 

## 2012-08-30 NOTE — Telephone Encounter (Signed)
Message copied by Tarri Fuller on Mon Aug 30, 2012 12:04 PM ------      Message from: [place], [place] T      Created: Wed Aug 25, 2012  1:24 PM       Please inform patient stress test normal.      Tereso Newcomer, PA-C  1:23 PM 08/25/2012 ------

## 2012-09-02 ENCOUNTER — Telehealth: Payer: Self-pay | Admitting: *Deleted

## 2012-09-02 NOTE — Telephone Encounter (Signed)
pt notified about her BP readings close to target, [employer]. PAC happy with this. pt states per recommendation from Dr. Tenny Craw to take meds in AM and then take BP close to lunch time, readings much better.

## 2012-10-12 ENCOUNTER — Encounter (HOSPITAL_COMMUNITY): Payer: Self-pay

## 2012-10-12 ENCOUNTER — Encounter (HOSPITAL_COMMUNITY)
Admission: RE | Admit: 2012-10-12 | Discharge: 2012-10-12 | Disposition: A | Discharge status: Home / Self Care | Payer: BC Managed Care – PPO | Source: Ambulatory Visit | Attending: Obstetrics and Gynecology | Admitting: Obstetrics and Gynecology

## 2012-10-12 LAB — CBC
HCT: 34.1 % — ABNORMAL LOW (ref 36.0–46.0)
Hemoglobin: 10.9 g/dL — ABNORMAL LOW (ref 12.0–15.0)
MCH: 26.2 pg (ref 26.0–34.0)
MCV: 82 fL (ref 78.0–100.0)
RBC: 4.16 MIL/uL (ref 3.87–5.11)

## 2012-10-12 LAB — BASIC METABOLIC PANEL
BUN: 16 mg/dL (ref 6–23)
GFR calc Af Amer: >90 mL/min (ref >90)
GFR calc non Af Amer: >90 mL/min (ref >90)

## 2012-10-12 NOTE — Patient Instructions (Addendum)
20 Martha Hamilton  10/12/2012   Your procedure is scheduled on:  10/18/12  Enter through the Main Entrance of [facility] at 8 AM.  Pick up the phone at the desk and dial [phone].   Call this number if you have problems the morning of surgery: [phone]   Remember:   Do not eat food:After Midnight.  Do not drink clear liquids: After Midnight.  Take these medicines the morning of surgery with A SIP OF WATER: Cozaar, Prilosec. Hold Metformin 24hrs before surgery.   Do not wear jewelry, make-up or nail polish.  Do not wear lotions, powders, or perfumes. You may wear deodorant.  Do not shave 48 hours prior to surgery.  Do not bring valuables to the hospital.  Contacts, dentures or bridgework may not be worn into surgery.  Leave suitcase in the car. After surgery it may be brought to your room.  For patients admitted to the hospital, checkout time is 11:00 AM the day of discharge.   Patients discharged the day of surgery will not be allowed to drive home.  Name and phone number of your driver: Lizet Kelso  Special Instructions: Shower using CHG 2 nights before surgery and the night before surgery.  If you shower the day of surgery use CHG.  Use special wash - you have one bottle of CHG for all showers.  You should use approximately 1/3 of the bottle for each shower.   Please read over the following fact sheets that you were given: Surgical Site Infection Prevention

## 2012-10-12 NOTE — Pre-Procedure Instructions (Signed)
Cardiac clearance testing results and notes reviewed by Dr. Rodman Pickle. No orders given, approved for surgery on 10/18/12.

## 2012-10-16 ENCOUNTER — Other Ambulatory Visit: Payer: Self-pay | Admitting: Family Medicine

## 2012-10-18 ENCOUNTER — Encounter (HOSPITAL_COMMUNITY)
Admission: RE | Disposition: A | Discharge status: Home / Self Care | Payer: Self-pay | Source: Ambulatory Visit | Attending: Obstetrics and Gynecology

## 2012-10-18 ENCOUNTER — Encounter (HOSPITAL_COMMUNITY): Payer: Self-pay | Admitting: Anesthesiology

## 2012-10-18 ENCOUNTER — Ambulatory Visit (HOSPITAL_COMMUNITY)
Admission: RE | Admit: 2012-10-18 | Discharge: 2012-10-18 | Disposition: A | Discharge status: Home / Self Care | Payer: BC Managed Care – PPO | Source: Ambulatory Visit | Attending: Obstetrics and Gynecology | Admitting: Obstetrics and Gynecology

## 2012-10-18 ENCOUNTER — Ambulatory Visit (HOSPITAL_COMMUNITY): Payer: BC Managed Care – PPO | Admitting: Anesthesiology

## 2012-10-18 DIAGNOSIS — I10 Essential (primary) hypertension: Secondary | ICD-10-CM | POA: Insufficient documentation

## 2012-10-18 DIAGNOSIS — N950 Postmenopausal bleeding: Secondary | ICD-10-CM | POA: Insufficient documentation

## 2012-10-18 DIAGNOSIS — R9389 Abnormal findings on diagnostic imaging of other specified body structures: Secondary | ICD-10-CM | POA: Insufficient documentation

## 2012-10-18 DIAGNOSIS — N840 Polyp of corpus uteri: Secondary | ICD-10-CM | POA: Insufficient documentation

## 2012-10-18 DIAGNOSIS — E119 Type 2 diabetes mellitus without complications: Secondary | ICD-10-CM | POA: Insufficient documentation

## 2012-10-18 HISTORY — PX: HYSTEROSCOPY WITH D & C: SHX1775

## 2012-10-18 HISTORY — PX: POLYPECTOMY: SHX5525

## 2012-10-18 LAB — GLUCOSE, CAPILLARY: Glucose-Capillary: 162 mg/dL — ABNORMAL HIGH (ref 70–99)

## 2012-10-18 SURGERY — DILATATION AND CURETTAGE /HYSTEROSCOPY
Anesthesia: General | Site: Vagina | Wound class: Clean Contaminated

## 2012-10-18 MED ORDER — METOCLOPRAMIDE HCL 5 MG/ML IJ SOLN: 10.0000 mg | Freq: Once | INTRAMUSCULAR | Status: DC | PRN

## 2012-10-18 MED ORDER — SODIUM CHLORIDE 0.9 % IR SOLN
Status: DC | PRN
  Administered 2012-10-18: 3000 mL

## 2012-10-18 MED ORDER — OXYCODONE-ACETAMINOPHEN 5-325 MG PO TABS: 1.0000 | ORAL_TABLET | ORAL | Status: DC | PRN

## 2012-10-18 MED ORDER — PROPOFOL 10 MG/ML IV EMUL
INTRAVENOUS | Status: DC | PRN
  Administered 2012-10-18: 180 mg via INTRAVENOUS

## 2012-10-18 MED ORDER — CHLOROPROCAINE HCL 1 % IJ SOLN
INTRAMUSCULAR | Status: DC | PRN
  Administered 2012-10-18: 10 mL

## 2012-10-18 MED ORDER — PROPOFOL 10 MG/ML IV EMUL
INTRAVENOUS | Status: AC
  Filled 2012-10-18: qty 20

## 2012-10-18 MED ORDER — LACTATED RINGERS IV SOLN
INTRAVENOUS | Status: DC
  Administered 2012-10-18 (×2): via INTRAVENOUS

## 2012-10-18 MED ORDER — IBUPROFEN 600 MG PO TABS: 600.0000 mg | ORAL_TABLET | Freq: Four times a day (QID) | ORAL | Status: DC | PRN

## 2012-10-18 MED ORDER — FENTANYL CITRATE 0.05 MG/ML IJ SOLN
INTRAMUSCULAR | Status: DC | PRN
  Administered 2012-10-18: 50 ug via INTRAVENOUS

## 2012-10-18 MED ORDER — FENTANYL CITRATE 0.05 MG/ML IJ SOLN: 25.0000 ug | INTRAMUSCULAR | Status: DC | PRN

## 2012-10-18 MED ORDER — MIDAZOLAM HCL 5 MG/5ML IJ SOLN
INTRAMUSCULAR | Status: DC | PRN
  Administered 2012-10-18: 2 mg via INTRAVENOUS

## 2012-10-18 MED ORDER — ONDANSETRON HCL 4 MG/2ML IJ SOLN
INTRAMUSCULAR | Status: AC
  Filled 2012-10-18: qty 2

## 2012-10-18 MED ORDER — MEPERIDINE HCL 25 MG/ML IJ SOLN: 6.2500 mg | INTRAMUSCULAR | Status: DC | PRN

## 2012-10-18 MED ORDER — LIDOCAINE HCL (CARDIAC) 20 MG/ML IV SOLN
INTRAVENOUS | Status: DC | PRN
  Administered 2012-10-18: 100 mg via INTRAVENOUS

## 2012-10-18 MED ORDER — MIDAZOLAM HCL 2 MG/2ML IJ SOLN
INTRAMUSCULAR | Status: AC
  Filled 2012-10-18: qty 2

## 2012-10-18 MED ORDER — FENTANYL CITRATE 0.05 MG/ML IJ SOLN
INTRAMUSCULAR | Status: AC
  Filled 2012-10-18: qty 2

## 2012-10-18 MED ORDER — LIDOCAINE HCL (CARDIAC) 20 MG/ML IV SOLN
INTRAVENOUS | Status: AC
  Filled 2012-10-18: qty 5

## 2012-10-18 MED ORDER — CHLOROPROCAINE HCL 1 % IJ SOLN
INTRAMUSCULAR | Status: AC
  Filled 2012-10-18: qty 30

## 2012-10-18 SURGICAL SUPPLY — 12 items
CATH ROBINSON RED A/P 16FR (CATHETERS) ×2 IMPLANT
CONTAINER PREFILL 10% NBF 60ML (FORM) ×4 IMPLANT
DRESSING TELFA 8X3 (GAUZE/BANDAGES/DRESSINGS) ×2 IMPLANT
ELECTRODE RT ANGLE VERSAPOINT (CUTTING LOOP) IMPLANT
GLOVE BIOGEL PI IND STRL 6.5 (GLOVE) ×1 IMPLANT
GLOVE BIOGEL PI INDICATOR 6.5 (GLOVE) ×1
GLOVE SURG SS PI 6.0 STRL IVOR (GLOVE) ×2 IMPLANT
GOWN STRL REIN XL XLG (GOWN DISPOSABLE) ×4 IMPLANT
PACK HYSTEROSCOPY LF (CUSTOM PROCEDURE TRAY) ×2 IMPLANT
PAD OB MATERNITY 4.3X12.25 (PERSONAL CARE ITEMS) ×2 IMPLANT
TOWEL OR 17X24 6PK STRL BLUE (TOWEL DISPOSABLE) ×4 IMPLANT
WATER STERILE IRR 1000ML POUR (IV SOLUTION) ×2 IMPLANT

## 2012-10-18 NOTE — Op Note (Signed)
PROCEDURE DATE: 10/18/2012  PREOPERATIVE DIAGNOSIS: 62 yo with postmenopausal vaginal bleeding and thickened endometrium POSTOPERATIVE DIAGNOSIS: The same and uterine polyp PROCEDURE:     Dilation and curettage with hysteroscopy and polypectomy. SURGEON:  Dr. Jolayne Panther  INDICATIONS: 61 y.o. yo [id number] with postmenopausal vaginal bleeding and thickened endometrium.  Risks of surgery were discussed with the patient including but not limited to: bleeding which may require transfusion; infection which may require antibiotics; injury to uterus or surrounding organs;need for additional procedures including laparotomy or laparoscopy; and other postoperative/anesthesia complications. Written informed consent was obtained.    FINDINGS:  A 8 week- size anteverted uterus, moderate amounts of products of conception, specimen sent to pathology.  ANESTHESIA:    Monitored intravenous sedation, paracervical block. INTRAVENOUS FLUIDS:  1000 ml of LR Fluid deficit: 75 cc ESTIMATED BLOOD LOSS:  Less than 20 ml. SPECIMENS:  Uterine polyp measuring 2 cm and endometrial currettings COMPLICATIONS:  None immediate.  PROCEDURE DETAILS:  The patient was taken to the operating room where general anesthesia was administered and was found to be adequate.  After an adequate timeout was performed, she was placed in the dorsal lithotomy position and examined; then prepped and draped in the sterile manner.   Her bladder was catheterized for an unmeasured amount of clear, yellow urine. A vaginal speculum was then placed in the patient's vagina and a single tooth tenaculum was applied to the anterior lip of the cervix.  A paracervical block using 1% Marcaine was administered. The cervix was gently dilated through serial insertion of Hagar dilators to accommodate a polyp forceps that was gently advanced to the uterine fundus.  The polyp was grasped and removed.  A sharp curettage was then performed to confirm complete emptying of the  uterus.There was minimal bleeding noted and the tenaculum removed with good hemostasis noted.  The patient tolerated the procedure well.  The patient was taken to the recovery area in stable condition.

## 2012-10-18 NOTE — Anesthesia Postprocedure Evaluation (Signed)
  Anesthesia Post-op Note  Patient: Martha Hamilton  Procedure(s) Performed: Procedure(s): DILATATION AND CURETTAGE /HYSTEROSCOPY (N/A) POLYPECTOMY (N/A)  Patient Location: PACU  Anesthesia Type:General  Level of Consciousness: awake, alert  and oriented  Airway and Oxygen Therapy: Patient Spontanous Breathing  Post-op Pain: none  Post-op Assessment: Post-op Vital signs reviewed, Patient's Cardiovascular Status Stable, Respiratory Function Stable, Patent Airway, No signs of Nausea or vomiting and Pain level controlled  Post-op Vital Signs: Reviewed and stable  Complications: No apparent anesthesia complications

## 2012-10-18 NOTE — Transfer of Care (Signed)
Immediate Anesthesia Transfer of Care Note  Patient: Martha Hamilton  Procedure(s) Performed: Procedure(s): DILATATION AND CURETTAGE /HYSTEROSCOPY (N/A) POLYPECTOMY (N/A)  Patient Location: PACU  Anesthesia Type:General  Level of Consciousness: awake, alert  and oriented  Airway & Oxygen Therapy: Patient Spontanous Breathing and Patient connected to nasal cannula oxygen  Post-op Assessment: Report given to PACU RN and Post -op Vital signs reviewed and stable  Post vital signs: Reviewed and stable  Complications: No apparent anesthesia complications

## 2012-10-18 NOTE — Anesthesia Procedure Notes (Signed)
Procedure Name: LMA Insertion Date/Time: 10/18/2012 10:24 AM Performed by: Arisa Congleton, Jannet Askew Pre-anesthesia Checklist: Patient identified, Timeout performed, Emergency Drugs available, Suction available and Patient being monitored Patient Re-evaluated:Patient Re-evaluated prior to inductionOxygen Delivery Method: Circle system utilized Preoxygenation: Pre-oxygenation with 100% oxygen Intubation Type: IV induction LMA: LMA inserted LMA Size: 4.0 Tube type: Oral Number of attempts: 1 Placement Confirmation: positive ETCO2 and breath sounds checked- equal and bilateral

## 2012-10-18 NOTE — H&P (Signed)
Martha Hamilton is an 62 y.o. female postmenopausal with BMI 34 presenting today for D&C hysteroscopy with polypectomy for management of episodes of postmenopausal vaginal bleeding. Patient reports 3-days of vaginal bleeding in October followed by monthly episodes of 1-day of spotting. Patient is otherwise doing well and without any other complaints.  Pertinent Gynecological History: Menses: post-menopausal Bleeding: post menopausal bleeding Contraception: none DES exposure: denies Blood transfusions: none Sexually transmitted diseases: no past history Previous GYN Procedures: n/a  Last mammogram: normal Date: 03/2012 Last pap: normal Date: 04/2011    Menstrual History: Patient's last menstrual period was 06/13/2001.    Past Medical History  Diagnosis Date  . Allergic rhinitis   . DM2 (diabetes mellitus, type 2)   . Hypertension   . Osteoarthritis     hands  . Obesity   . Breast cancer   . GERD (gastroesophageal reflux disease)   . Hx of cardiovascular stress test     a. Lex MV 2/14:  EF 69%, no ischemia    Past Surgical History  Procedure Laterality Date  . Gastric restriction surgery      for reflux  . Appendectomy  1976  . Breast surgery      right breast, lumpectomy w/nodes    Family History  Problem Relation Age of Onset  . Diabetes Mother   . Stroke Father   . CAD Father 30    Died with MI  . Heart disease Mother     Pacemaker, CHF    Social History:  reports that she has never smoked. She has never used smokeless tobacco. She reports that she does not drink alcohol or use illicit drugs.  Allergies:  Allergies  Allergen Reactions  . Atorvastatin Other (See Comments)    Muscle cramping  . Cholestyramine Other (See Comments)    REACTION: Muscles tightened up, drew up.  . Niacin Nausea And Vomiting    Prescriptions prior to admission  Medication Sig Dispense Refill  . ACCU-CHEK SOFTCLIX LANCETS lancets       . acetaminophen (TYLENOL) 500 MG tablet Take  500 mg by mouth every 6 (six) hours as needed.      Marland Kitchen amitriptyline (ELAVIL) 25 MG tablet Take 1 tablet (25 mg total) by mouth at bedtime.  90 tablet  3  . aspirin 81 MG tablet Take 81 mg by mouth daily.        . Cholecalciferol (VITAMIN D) 1000 UNITS capsule Take 2,000 Units by mouth daily.       . diclofenac sodium (VOLTAREN) 1 % GEL Apply 1 application topically as needed. For arthritis in finger      . fenofibrate (TRICOR) 145 MG tablet TAKE 1 TABLET DAILY  90 tablet  3  . Glucosamine-MSM-Hyaluronic Acd (JOINT HEALTH) 750-375-30 MG TABS Take 2 tablets by mouth daily.      Marland Kitchen glucose blood (ACCU-CHEK COMPACT TEST DRUM) test strip Use to test sugar once daily  100 each  3  . hydrochlorothiazide (MICROZIDE) 12.5 MG capsule Take 1 capsule (12.5 mg total) by mouth daily.  90 capsule  3  . losartan (COZAAR) 100 MG tablet Take 1 tablet (100 mg total) by mouth daily.  90 tablet  3  . metFORMIN (GLUCOPHAGE) 500 MG tablet TAKE 1 TABLET TWICE A DAY WITH BREAKFAST AND EVENING MEAL  180 tablet  2  . Omega-3 Fatty Acids (FISH OIL) 1200 MG CAPS Take 1 capsule by mouth daily.      Marland Kitchen omeprazole (PRILOSEC) 20 MG capsule  Take 1 capsule (20 mg total) by mouth daily.  90 capsule  3  . OVER THE COUNTER MEDICATION Vitalizer Gold without Vitamin  K taking one group of supplement daily.       Marland Kitchen SALINE NASAL SPRAY NA Place 1 spray into the nose daily.       . simvastatin (ZOCOR) 40 MG tablet Take 1 tablet (40 mg total) by mouth at bedtime.  90 tablet  3    Review of Systems  All other systems reviewed and are negative.    Blood pressure 154/75, pulse 101, temperature 98.4 F (36.9 C), temperature source Oral, resp. rate 16, height 4\' 11"  (1.499 m), weight 169 lb (76.658 kg), last menstrual period 06/13/2001, SpO2 99.00%. Physical Exam GENERAL: Well-developed, well-nourished female in no acute distress.  HEENT: Normocephalic, atraumatic. Sclerae anicteric.  NECK: Supple. Normal thyroid.  LUNGS: Clear to  auscultation bilaterally.  HEART: Regular rate and rhythm. BREASTS: Symmetric in size. No palpable masses or lymphadenopathy, skin changes, or nipple drainage. ABDOMEN: Soft, nontender, nondistended. No organomegaly. PELVIC: Deferred to OR EXTREMITIES: No cyanosis, clubbing, or edema, 2+ distal pulses.   Results for orders placed during the hospital encounter of 10/18/12 (from the past 24 hour(s))  GLUCOSE, CAPILLARY     Status: Abnormal   Collection Time    10/18/12  8:10 AM      Result Value Range   Glucose-Capillary 162 (*) 70 - 99 mg/dL    No results found. 05/2012 ultrasound:  IMPRESSION:  1. Abnormal post menopausal endometrial thickness measuring 13 mm.  In the setting of post-menopausal bleeding, endometrial sampling is  indicated to exclude carcinoma. If results are benign,  sonohysterogram should be considered for focal lesion work-up.  (Ref: Radiological Reasoning: Algorithmic Workup of Abnormal  Vaginal Bleeding with Endovaginal Sonography and Sonohysterography.  AJR 2008; 161:W96-04)  2. Normal postmenopausal appearance of both ovaries.   10/30 EMBx:  - POLYPOID FRAGMENT OF PROLIFERATIVE PATTERN ENDOMETRIUM.  - BENIGN ENDOCERVICAL TYPE POLYP.  - NO HYPERPLASIA, ATYPIA OR MALIGNANCY IDENTIFIED.   Assessment/Plan: 62 yo postmenopausal with an episode of postmenopausal vaginal bleeding here for D&C hysteroscopy polypectomy - Risks, benefits and alternatives were explained including but not limited to risks of bleeding, infection, uterine perforation, damage to adjacent organs. Patient verbalized understanding and all questions were answered   Chandrika Sandles 10/18/2012, 8:46 AM

## 2012-10-18 NOTE — Anesthesia Preprocedure Evaluation (Addendum)
Anesthesia Evaluation  Patient identified by MRN, date of birth, ID band Patient awake    Reviewed: Allergy & Precautions, H&P , NPO status , Patient's Chart, lab work & pertinent test results  Airway Mallampati: III TM Distance: >3 FB Neck ROM: Full    Dental no notable dental hx. (+) Teeth Intact   Pulmonary neg pulmonary ROS,  breath sounds clear to auscultation  Pulmonary exam normal       Cardiovascular hypertension, Pt. on medications Rhythm:Regular Rate:Normal     Neuro/Psych negative psych ROS   GI/Hepatic GERD-  Medicated and Controlled,  Endo/Other  diabetes, Well Controlled, Type 2, Oral Hypoglycemic AgentsObesity  Renal/GU   negative genitourinary   Musculoskeletal  (+) Arthritis -, Osteoarthritis,    Abdominal (+) + obese,   Peds  Hematology   Anesthesia Other Findings   Reproductive/Obstetrics PMB Endometrial Polyp                          Anesthesia Physical Anesthesia Plan  ASA: II  Anesthesia Plan: General   Post-op Pain Management:    Induction: Intravenous  Airway Management Planned: LMA  Additional Equipment:   Intra-op Plan:   Post-operative Plan: Extubation in OR  Informed Consent: I have reviewed the patients History and Physical, chart, labs and discussed the procedure including the risks, benefits and alternatives for the proposed anesthesia with the patient or authorized representative who has indicated his/her understanding and acceptance.   Dental advisory given  Plan Discussed with: CRNA, Anesthesiologist and Surgeon  Anesthesia Plan Comments:         Anesthesia Quick Evaluation

## 2012-10-19 ENCOUNTER — Encounter (HOSPITAL_COMMUNITY): Payer: Self-pay | Admitting: Obstetrics and Gynecology

## 2012-11-10 ENCOUNTER — Ambulatory Visit (INDEPENDENT_AMBULATORY_CARE_PROVIDER_SITE_OTHER): Payer: BC Managed Care – PPO | Admitting: Obstetrics and Gynecology

## 2012-11-10 ENCOUNTER — Encounter: Payer: Self-pay | Admitting: Obstetrics and Gynecology

## 2012-11-10 VITALS — BP 154/95 | HR 110 | Resp 16 | Ht 59.0 in | Wt 171.0 lb

## 2012-11-10 DIAGNOSIS — Z09 Encounter for follow-up examination after completed treatment for conditions other than malignant neoplasm: Secondary | ICD-10-CM

## 2012-11-10 NOTE — Progress Notes (Signed)
Patient ID: Martha Hamilton, female   DOB: Sep 18, 1950, 62 y.o.   MRN: [id number] 63 yo G0 presenting today for post-op check s/p D&C hysteroscopy with polypectomy on 4/7 secondary to postmenopausal vaginal bleeding. Patient is doing well without any complaints. She states she feels 100% than before her surgery. She had a few days of spotting post-op and nothing since. She denies any cramping pains or fever.  Past Medical History  Diagnosis Date  . Allergic rhinitis   . DM2 (diabetes mellitus, type 2)   . Hypertension   . Osteoarthritis     hands  . Obesity   . Breast cancer   . GERD (gastroesophageal reflux disease)   . Hx of cardiovascular stress test     a. Lex MV 2/14:  EF 69%, no ischemia    Past Surgical History  Procedure Laterality Date  . Gastric restriction surgery      for reflux  . Appendectomy  1976  . Breast surgery      right breast, lumpectomy w/nodes  . Hysteroscopy w/d&c N/A 10/18/2012    Procedure: DILATATION AND CURETTAGE /HYSTEROSCOPY;  Surgeon: Catalina Antigua, MD;  Location: [facility];  Service: Gynecology;  Laterality: N/A;  . Polypectomy N/A 10/18/2012    Procedure: POLYPECTOMY;  Surgeon: Catalina Antigua, MD;  Location: [facility];  Service: Gynecology;  Laterality: N/A;   Family History  Problem Relation Age of Onset  . Diabetes Mother   . Stroke Father   . CAD Father 72    Died with MI  . Heart disease Mother     Pacemaker, CHF   History  Substance Use Topics  . Smoking status: Never Smoker   . Smokeless tobacco: Never Used  . Alcohol Use: No   Filed Vitals:   11/10/12 1106  BP: 154/95  Pulse: 110  Resp: 16    GENERAL: Well-developed, well-nourished female in no acute distress.  HEENT: Normocephalic, atraumatic. Sclerae anicteric.  NECK: Supple. Normal thyroid.  LUNGS: Clear to auscultation bilaterally.  HEART: Regular rate and rhythm. BREASTS: Symmetric in size. No palpable masses or lymphadenopathy, skin changes, or nipple drainage. ABDOMEN: Soft,  nontender, nondistended. No organomegaly. PELVIC: Normal external female genitalia. Vagina is pink and rugated.  Normal discharge. Normal appearing cervix. Uterus is normal in size. No adnexal mass or tenderness. EXTREMITIES: No cyanosis, clubbing, or edema, 2+ distal pulses.  Pathology 10/18/2012 Diagnosis Endometrium, curettage, with polyps - FRAGMENTS OF BENIGN ENDOMETRIAL POLYP; NEGATIVE FOR ATYPIA OR MALIGNANCY. - SEPARATE FRAGMENTS OF BENIGN ENDOMETRIUM; NEGATIVE FOR HYPERPLASIA OR MALIGNANCY. - BENIGN ENDOCERVICAL MUCOSA. - DETACHED FRAGMENTS OF SQUAMOUS EPITHELIUM; NEGATIVE FOR INTRAEPITHELIAL LESION OR MALIGNANCY.  A/P 62 yo s/p D&C hysteroscopy and polypectomy secondary to PMB - pathology results reviewed with the patient - Advised to return for any further episodes of vaginal bleeding - RTC for annual exam or prn

## 2012-11-17 ENCOUNTER — Other Ambulatory Visit: Payer: Self-pay | Admitting: Family Medicine

## 2012-12-21 NOTE — Telephone Encounter (Signed)
Pt [facility] note and [facility]; spoke with pt and she did not ck with [facility] for refill on fenofibrate that was given 11/17/12. Pt will contact [facility] about refill and pt will pick up new prescription form to be completed at [facility]. Form at front desk for pick and pt notified.

## 2013-01-24 ENCOUNTER — Other Ambulatory Visit: Payer: Self-pay | Admitting: Family Medicine

## 2013-01-25 ENCOUNTER — Telehealth: Payer: Self-pay | Admitting: Family Medicine

## 2013-01-25 DIAGNOSIS — E785 Hyperlipidemia, unspecified: Secondary | ICD-10-CM

## 2013-01-25 DIAGNOSIS — E119 Type 2 diabetes mellitus without complications: Secondary | ICD-10-CM

## 2013-01-25 DIAGNOSIS — Z0000 Encounter for general adult medical examination without abnormal findings: Secondary | ICD-10-CM

## 2013-01-25 NOTE — Telephone Encounter (Signed)
Message copied by [name] on Tue Jan 25, 2013 10:23 PM ------      Message from: [name]      Created: Tue Jan 25, 2013 11:22 AM      Regarding: Lab orders for Wednesday, 7.23.14       Patient is scheduled for [facility] labs, please order future labs, Thanks , Martha Hamilton       ------

## 2013-02-02 ENCOUNTER — Other Ambulatory Visit (INDEPENDENT_AMBULATORY_CARE_PROVIDER_SITE_OTHER): Payer: BC Managed Care – PPO

## 2013-02-02 DIAGNOSIS — I10 Essential (primary) hypertension: Secondary | ICD-10-CM

## 2013-02-02 DIAGNOSIS — E785 Hyperlipidemia, unspecified: Secondary | ICD-10-CM

## 2013-02-02 DIAGNOSIS — E119 Type 2 diabetes mellitus without complications: Secondary | ICD-10-CM

## 2013-02-02 DIAGNOSIS — Z0000 Encounter for general adult medical examination without abnormal findings: Secondary | ICD-10-CM

## 2013-02-02 LAB — CBC WITH DIFFERENTIAL/PLATELET
Basophils Absolute: 0 10*3/uL (ref 0.0–0.1)
Eosinophils Absolute: 0.3 10*3/uL (ref 0.0–0.7)
HCT: 31.8 % — ABNORMAL LOW (ref 36.0–46.0)
Lymphs Abs: 2.3 10*3/uL (ref 0.7–4.0)
MCHC: 32.5 g/dL (ref 30.0–36.0)
MCV: 79.6 fl (ref 78.0–100.0)
Monocytes Absolute: 0.4 10*3/uL (ref 0.1–1.0)
Neutrophils Relative %: 46.8 % (ref 43.0–77.0)
Platelets: 413 10*3/uL — ABNORMAL HIGH (ref 150.0–400.0)
RDW: 14.9 % — ABNORMAL HIGH (ref 11.5–14.6)

## 2013-02-02 LAB — COMPREHENSIVE METABOLIC PANEL
ALT: 23 U/L (ref 0–35)
AST: 28 U/L (ref 0–37)
Alkaline Phosphatase: 34 U/L — ABNORMAL LOW (ref 39–117)
Glucose, Bld: 129 mg/dL — ABNORMAL HIGH (ref 70–99)
Sodium: 140 mEq/L (ref 135–145)
Total Bilirubin: 0.3 mg/dL (ref 0.3–1.2)
Total Protein: 7.2 g/dL (ref 6.0–8.3)

## 2013-02-02 LAB — LDL CHOLESTEROL, DIRECT: Direct LDL: 65.3 mg/dL

## 2013-02-02 LAB — LIPID PANEL
HDL: 35.5 mg/dL — ABNORMAL LOW (ref >39.00)
Total CHOL/HDL Ratio: 4
VLDL: 58.4 mg/dL — ABNORMAL HIGH (ref 0.0–40.0)

## 2013-02-02 LAB — HEMOGLOBIN A1C: Hgb A1c MFr Bld: 7.4 % — ABNORMAL HIGH (ref 4.6–6.5)

## 2013-02-09 ENCOUNTER — Encounter: Payer: Self-pay | Admitting: Internal Medicine

## 2013-02-09 ENCOUNTER — Encounter: Payer: Self-pay | Admitting: Family Medicine

## 2013-02-09 ENCOUNTER — Ambulatory Visit (INDEPENDENT_AMBULATORY_CARE_PROVIDER_SITE_OTHER): Payer: BC Managed Care – PPO | Admitting: Family Medicine

## 2013-02-09 VITALS — BP 138/82 | HR 109 | Temp 98.8°F | Ht 59.5 in | Wt 168.8 lb

## 2013-02-09 DIAGNOSIS — D649 Anemia, unspecified: Secondary | ICD-10-CM | POA: Insufficient documentation

## 2013-02-09 DIAGNOSIS — E785 Hyperlipidemia, unspecified: Secondary | ICD-10-CM

## 2013-02-09 DIAGNOSIS — Z0000 Encounter for general adult medical examination without abnormal findings: Secondary | ICD-10-CM

## 2013-02-09 DIAGNOSIS — D631 Anemia in chronic kidney disease: Secondary | ICD-10-CM | POA: Insufficient documentation

## 2013-02-09 DIAGNOSIS — Z1211 Encounter for screening for malignant neoplasm of colon: Secondary | ICD-10-CM

## 2013-02-09 DIAGNOSIS — I10 Essential (primary) hypertension: Secondary | ICD-10-CM

## 2013-02-09 DIAGNOSIS — E119 Type 2 diabetes mellitus without complications: Secondary | ICD-10-CM

## 2013-02-09 MED ORDER — METFORMIN HCL 1000 MG PO TABS: 1000.0000 mg | ORAL_TABLET | Freq: Two times a day (BID) | ORAL | Status: DC

## 2013-02-09 NOTE — Progress Notes (Signed)
Subjective:    Patient ID: Martha Hamilton, female    DOB: 1951/03/26, 62 y.o.   MRN: [id number]  HPI Here for health maintenance exam and to review chronic medical problems   Is feeling great overall   Wt is down 3 lb with bmi of 33 She is working very hard on wt loss Using the Wii and also walking and yard work   Colon cancer screen - has not had colonoscopy in a while - had that before her fundoplication - 1998  ifob fall 2013 Flu vaccine 9/13 Mammogram 9/13 -nl -- with personal hx of breast cancer- she will schedule when she gets her card - she is good about that  No new lumps   Pap 3/14 - pap with gyn  4/14 had a polyp and then a D and C  Bled for a week -no more bleeding at all  She did very well   Td 7/10  Zoster vaccine 7/12  Hyperlipidemia Lab Results  Component Value Date   CHOL 134 02/02/2013   CHOL 137 04/28/2012   CHOL 128 10/21/2011   Lab Results  Component Value Date   HDL 35.50* 02/02/2013   HDL 38.60* 04/28/2012   HDL 41.30 10/21/2011   Lab Results  Component Value Date   LDLCALC 51 10/21/2011   LDLCALC 48 04/29/2010   Lab Results  Component Value Date   TRIG 292.0* 02/02/2013   TRIG 218.0* 04/28/2012   TRIG 180.0* 10/21/2011   Lab Results  Component Value Date   CHOLHDL 4 02/02/2013   CHOLHDL 4 04/28/2012   CHOLHDL 3 10/21/2011   Lab Results  Component Value Date   LDLDIRECT 65.3 02/02/2013   LDLDIRECT 76.5 04/28/2012   LDLDIRECT 62.6 04/23/2011   zocor and tricor and diet  She is eating very smart and avoiding fatty foods She does eat some cheese   Anemia Lab Results  Component Value Date   WBC 5.6 02/02/2013   HGB 10.4* 02/02/2013   HCT 31.8* 02/02/2013   MCV 79.6 02/02/2013   PLT 413.0* 02/02/2013   ? Etiology of this   Diabetes Home sugar results - average 130s-150 max -no 200s and no low sugars  DM diet - is overall excellent  Exercise - much better as well  Symptoms-none Has had more stress lately A1C last  Lab Results  Component  Value Date   HGBA1C 7.4* 02/02/2013  was 7.2  No problems with medications  Renal protection on arb Last eye exam 1/14 ok    She had a cardiac work up - before her gyn surgery -nl lexiscan myoview   Patient Active Problem List   Diagnosis Date Noted  . Abnormal EKG 07/27/2012  . Preoperative clearance 07/27/2012  . Post-menopausal bleeding 05/04/2012  . Parotiditis 12/29/2011  . Special screening for malignant neoplasms, colon 04/29/2011  . Gynecological examination 04/29/2011  . Routine general medical examination at a health care facility 04/20/2011  . Right shoulder pain 11/18/2010  . OSTEOARTHRITIS, HANDS, BILATERAL 10/31/2008  . DIABETES MELLITUS, TYPE II 07/10/2008  . HYPERLIPIDEMIA 01/26/2008  . HYPERTENSION, ESSENTIAL NOS 01/26/2008  . ALLERGIC RHINITIS 12/07/2007   Past Medical History  Diagnosis Date  . Allergic rhinitis   . DM2 (diabetes mellitus, type 2)   . Hypertension   . Osteoarthritis     hands  . Obesity   . Breast cancer   . GERD (gastroesophageal reflux disease)   . Hx of cardiovascular stress test     a. Lex  MV 2/14:  EF 69%, no ischemia   Past Surgical History  Procedure Laterality Date  . Gastric restriction surgery      for reflux  . Appendectomy  1976  . Breast surgery      right breast, lumpectomy w/nodes  . Hysteroscopy w/d&c N/A 10/18/2012    Procedure: DILATATION AND CURETTAGE /HYSTEROSCOPY;  Surgeon: Catalina Antigua, MD;  Location: [facility];  Service: Gynecology;  Laterality: N/A;  . Polypectomy N/A 10/18/2012    Procedure: POLYPECTOMY;  Surgeon: Catalina Antigua, MD;  Location: [facility];  Service: Gynecology;  Laterality: N/A;   History  Substance Use Topics  . Smoking status: Never Smoker   . Smokeless tobacco: Never Used  . Alcohol Use: No   Family History  Problem Relation Age of Onset  . Diabetes Mother   . Stroke Father   . CAD Father 12    Died with MI  . Heart disease Mother     Pacemaker, CHF   Allergies  Allergen Reactions   . Atorvastatin Other (See Comments)    Muscle cramping  . Cholestyramine Other (See Comments)    REACTION: Muscles tightened up, drew up.  . Niacin Nausea And Vomiting   Current Outpatient Prescriptions on File Prior to Visit  Medication Sig Dispense Refill  . ACCU-CHEK COMPACT PLUS test strip USE TO TEST SUGAR ONCE A DAY FOR DIABETES MELLITUS  100 each  1  . ACCU-CHEK SOFTCLIX LANCETS lancets       . acetaminophen (TYLENOL) 500 MG tablet Take 500 mg by mouth every 6 (six) hours as needed.      Marland Kitchen amitriptyline (ELAVIL) 25 MG tablet Take 1 tablet (25 mg total) by mouth at bedtime.  90 tablet  3  . aspirin 81 MG tablet Take 81 mg by mouth daily.        . Cholecalciferol (VITAMIN D) 1000 UNITS capsule Take 2,000 Units by mouth daily.       . diclofenac sodium (VOLTAREN) 1 % GEL Apply 1 application topically as needed. For arthritis in finger      . fenofibrate (TRICOR) 145 MG tablet TAKE 1 TABLET DAILY  90 tablet  0  . Glucosamine-MSM-Hyaluronic Acd (JOINT HEALTH) 750-375-30 MG TABS Take 2 tablets by mouth daily.      . hydrochlorothiazide (MICROZIDE) 12.5 MG capsule Take 1 capsule (12.5 mg total) by mouth daily.  90 capsule  3  . losartan (COZAAR) 100 MG tablet Take 1 tablet (100 mg total) by mouth daily.  90 tablet  3  . metFORMIN (GLUCOPHAGE) 500 MG tablet TAKE 1 TABLET TWICE A DAY WITH BREAKFAST AND EVENING MEAL  180 tablet  1  . Omega-3 Fatty Acids (FISH OIL) 1200 MG CAPS Take 1 capsule by mouth daily.      Marland Kitchen omeprazole (PRILOSEC) 20 MG capsule Take 1 capsule (20 mg total) by mouth daily.  90 capsule  3  . OVER THE COUNTER MEDICATION Vitalizer Gold without Vitamin  K taking one group of supplement daily.       Marland Kitchen SALINE NASAL SPRAY NA Place 1 spray into the nose daily.       . simvastatin (ZOCOR) 40 MG tablet Take 1 tablet (40 mg total) by mouth at bedtime.  90 tablet  3   No current facility-administered medications on file prior to visit.     Review of Systems Review of Systems   Constitutional: Negative for fever, appetite change, fatigue and unexpected weight change.  Eyes: Negative for pain  and visual disturbance.  Respiratory: Negative for cough and shortness of breath.   Cardiovascular: Negative for cp or palpitations    Gastrointestinal: Negative for nausea, diarrhea and constipation.  Genitourinary: Negative for urgency and frequency.  Skin: Negative for pallor or rash   Neurological: Negative for weakness, light-headedness, numbness and headaches.  Hematological: Negative for adenopathy. Does not bruise/bleed easily.  Psychiatric/Behavioral: Negative for dysphoric mood. The patient is not nervous/anxious.          Objective:   Physical Exam  Constitutional: She appears well-developed and well-nourished. No distress.  obese and well appearing   HENT:  Head: Normocephalic and atraumatic.  Right Ear: External ear normal.  Left Ear: External ear normal.  Nose: Nose normal.  Mouth/Throat: Oropharynx is clear and moist.  Eyes: Conjunctivae and EOM are normal. Pupils are equal, round, and reactive to light. Right eye exhibits no discharge. Left eye exhibits no discharge. No scleral icterus.  Neck: Normal range of motion. Neck supple. No JVD present. Carotid bruit is not present. No thyromegaly present.  Cardiovascular: Normal rate, regular rhythm, normal heart sounds and intact distal pulses.  Exam reveals no gallop.   Pulmonary/Chest: Effort normal and breath sounds normal. No respiratory distress. She has no wheezes. She exhibits no tenderness.  Abdominal: Soft. Bowel sounds are normal. She exhibits no distension, no abdominal bruit and no mass. There is no tenderness.  Musculoskeletal: She exhibits no edema and no tenderness.  Lymphadenopathy:    She has no cervical adenopathy.  Neurological: She is alert. She has normal reflexes. No cranial nerve deficit. She exhibits normal muscle tone. Coordination normal.  Skin: Skin is warm and dry. No rash noted.  No erythema. No pallor.  Psychiatric: She has a normal mood and affect.          Assessment & Plan:

## 2013-02-09 NOTE — Patient Instructions (Addendum)
Increase your metformin to 1000 mg twice daily  Keep working on diet and exercise Stop up front to talk to [facility] about colonoscopy referral  Take care of yourself Follow up in 3 months with labs prior

## 2013-02-10 ENCOUNTER — Telehealth: Payer: Self-pay

## 2013-02-10 NOTE — Assessment & Plan Note (Signed)
Disc goals for lipids and reasons to control them Rev labs with pt Rev low sat fat diet in detail   

## 2013-02-10 NOTE — Assessment & Plan Note (Signed)
Ref for colonoscopy  Pt continues to be anemic

## 2013-02-10 NOTE — Telephone Encounter (Signed)
Thanks

## 2013-02-10 NOTE — Assessment & Plan Note (Signed)
Despite good home glucose readings - reviewed in detail, her A1C is still not optimal  Will inc her metformin to 1000 bid if tolerated Rev need for wt loss in detail as well as low glycemic diet

## 2013-02-10 NOTE — Telephone Encounter (Signed)
Pt was picking peas at 3 pm and felt lightheaded and nauseated; pt came inside BP 88/53 P 126, pt layed down and drank water;3:30 BP was 108/57 P112 and 4:25 pm BP was 112/59 P111. Pt does not feel lightheaded or nauseated now but does not feel "quite right".  Pt said only recent change was started Metformin 1000 mg this AM. Pt said she had eaten good meals today. Dr Para March advised pt not to take Metformin 1000 mg tonight or in AM until advised by Dr Milinda Antis; pt has already taken Losartan today but will hold Losartan in the morning until advised by Dr Milinda Antis.Pt will drink plenty of water tonight and snack in between meals and at bedtime. If pt's condition were to worsen or change pt will go to ED for eval. Pt will wait to hear from Dr Milinda Antis.

## 2013-02-10 NOTE — Assessment & Plan Note (Signed)
bp in fair control at this time  No changes needed  Disc lifstyle change with low sodium diet and exercise  Lab rev 

## 2013-02-10 NOTE — Assessment & Plan Note (Signed)
Not rebounding after D and C Did ref to GI  Needs colonoscopy and has hx of fundoplication in past

## 2013-02-10 NOTE — Assessment & Plan Note (Signed)
Reviewed health habits including diet and exercise and skin cancer prevention Also reviewed health mt list, fam hx and immunizations  Rev wellness lab in detail  Disc need for wt loss for better health

## 2013-02-11 ENCOUNTER — Other Ambulatory Visit: Payer: Self-pay | Admitting: Family Medicine

## 2013-02-11 NOTE — Telephone Encounter (Signed)
Hold losartan today and start it back tomorrow Make sure to drink enough fluids and if bp remains low please let me know  If leg cramps persist also let me know  I hope she is feeling better

## 2013-02-11 NOTE — Telephone Encounter (Signed)
Reviewed instructions with patient  Verbalized understanding

## 2013-02-11 NOTE — Telephone Encounter (Signed)
Pt wants to know if she should restart Losartan as well (held last night) and which dose of Metformin should she start back, 500mg  or 1000mg .  She also wants Dr. Milinda Antis to know that she had severe leg cramps throughout the night.

## 2013-02-11 NOTE — Telephone Encounter (Signed)
I do not think metformin would have lowered bp - it may have been getting overheated - take the metformin and watch bp closely- if it happens again let me know  Last bps here are all nl or high range of nl

## 2013-02-15 ENCOUNTER — Other Ambulatory Visit: Payer: Self-pay | Admitting: Family Medicine

## 2013-02-21 ENCOUNTER — Other Ambulatory Visit: Payer: Self-pay

## 2013-02-21 DIAGNOSIS — Z1231 Encounter for screening mammogram for malignant neoplasm of breast: Secondary | ICD-10-CM

## 2013-03-23 ENCOUNTER — Ambulatory Visit (INDEPENDENT_AMBULATORY_CARE_PROVIDER_SITE_OTHER): Payer: BC Managed Care – PPO | Admitting: Family Medicine

## 2013-03-23 DIAGNOSIS — Z23 Encounter for immunization: Secondary | ICD-10-CM

## 2013-03-29 ENCOUNTER — Ambulatory Visit
Admission: RE | Admit: 2013-03-29 | Discharge: 2013-03-29 | Disposition: A | Discharge status: Home / Self Care | Payer: BC Managed Care – PPO | Source: Ambulatory Visit

## 2013-03-29 DIAGNOSIS — Z1231 Encounter for screening mammogram for malignant neoplasm of breast: Secondary | ICD-10-CM

## 2013-04-08 ENCOUNTER — Other Ambulatory Visit: Payer: Self-pay

## 2013-04-08 MED ORDER — MECLIZINE HCL 25 MG PO TABS: 25.0000 mg | ORAL_TABLET | Freq: Three times a day (TID) | ORAL | Status: DC | PRN

## 2013-04-08 NOTE — Telephone Encounter (Signed)
Please refill times one  F/u if no improvement 

## 2013-04-08 NOTE — Telephone Encounter (Signed)
Pt left v/m requesting refill for inner ear med to [facility] pt can not remember name of med; Meclizine was on historical med list.pt said she has been dizzy this morning and is sure it is an inner ear attack.Please advise.pt request cb when med sent to pharmacy.

## 2013-04-08 NOTE — Telephone Encounter (Signed)
Med refilled and pt notified  

## 2013-04-20 ENCOUNTER — Telehealth: Payer: Self-pay | Admitting: *Deleted

## 2013-04-20 NOTE — Telephone Encounter (Signed)
Dr. Marina Goodell, This pt is coming in on 05-06-13 for her colonoscopy.  While I was getting her chart ready for PV, I noted in her office visit with her PCP on 02-09-13 that she "continues to be anemic."  I was just checking to see if you wanted to see her in the office for this before her procedure or just wanted to see her the day of her colonoscopy.  Thank you, Baxter Hire

## 2013-04-20 NOTE — Telephone Encounter (Signed)
Day of her colonoscopy is OK. Thanks for checking

## 2013-04-22 ENCOUNTER — Ambulatory Visit (AMBULATORY_SURGERY_CENTER): Payer: Self-pay | Admitting: *Deleted

## 2013-04-22 ENCOUNTER — Encounter: Payer: Self-pay | Admitting: Internal Medicine

## 2013-04-22 VITALS — Ht 59.5 in | Wt 166.6 lb

## 2013-04-22 DIAGNOSIS — D649 Anemia, unspecified: Secondary | ICD-10-CM

## 2013-04-22 MED ORDER — MOVIPREP 100 G PO SOLR: 1.0000 | Freq: Once | ORAL | Status: DC

## 2013-04-22 NOTE — Progress Notes (Signed)
Denies allergies to eggs or soy products. Denies complications with sedation or anesthesia. 

## 2013-04-28 ENCOUNTER — Telehealth: Payer: Self-pay

## 2013-04-28 NOTE — Telephone Encounter (Signed)
Pt left v/m requesting prior auth for omeprazole 20 mg cap; pt request our [facility] [phone] for PA. Called and spoke with [place]; omeprazole approved 04/07/13 - 04/28/2014 case ID [id number]. Approval letter to follow. Pt notified and pt will contact [facility] for refill.

## 2013-05-06 ENCOUNTER — Ambulatory Visit (AMBULATORY_SURGERY_CENTER): Payer: BC Managed Care – PPO | Admitting: Internal Medicine

## 2013-05-06 ENCOUNTER — Encounter: Payer: Self-pay | Admitting: Internal Medicine

## 2013-05-06 VITALS — BP 128/70 | HR 88 | Temp 97.6°F | Resp 33 | Ht 59.5 in | Wt 166.0 lb

## 2013-05-06 DIAGNOSIS — Z1211 Encounter for screening for malignant neoplasm of colon: Secondary | ICD-10-CM

## 2013-05-06 DIAGNOSIS — D649 Anemia, unspecified: Secondary | ICD-10-CM

## 2013-05-06 MED ORDER — SODIUM CHLORIDE 0.9 % IV SOLN: 500.0000 mL | INTRAVENOUS | Status: DC

## 2013-05-06 NOTE — Op Note (Signed)
[facility] [address] [place], [address]   COLONOSCOPY PROCEDURE REPORT  PATIENT: Zaharah, Amir  MR#: [id number] BIRTHDATE: [birth year] , 62  yrs. old GENDER: Female ENDOSCOPIST: Roxy Cedar, MD REFERRED GM:[id number] Fransisca Connors, M.D. PROCEDURE DATE:  05/06/2013 PROCEDURE:   Colonoscopy, screening First Screening Colonoscopy - Avg.  risk and is 50 yrs.  old or older - No.  Prior Negative Screening - Now for repeat screening. 10 or more years since last screening  History of Adenoma - Now for follow-up colonoscopy & has been > or = to 3 yrs.  N/A  Polyps Removed Today? No.  Recommend repeat exam, <10 yrs? No. ASA CLASS:   Class II INDICATIONS:average risk screening. MEDICATIONS: MAC sedation, administered by CRNA and propofol (Diprivan) 200mg  IV  DESCRIPTION OF PROCEDURE:   After the risks benefits and alternatives of the procedure were thoroughly explained, informed consent was obtained.  A digital rectal exam revealed no abnormalities of the rectum.   The LB [id number]  endoscope was introduced through the anus and advanced to the cecum, which was identified by both the appendix and ileocecal valve. No adverse events experienced.   The quality of the prep was excellent, using MoviPrep  The instrument was then slowly withdrawn as the colon was fully examined.   COLON FINDINGS:Normal Terminal ileum.  A normal appearing cecum, ileocecal valve, and appendiceal orifice were identified.  The ascending, hepatic flexure, transverse, splenic flexure, descending, sigmoid colon and rectum appeared unremarkable.  No polyps or cancers were seen.  Retroflexed views revealed internal hemorrhoids. The time to cecum=2 minutes 10 seconds.  Withdrawal time=7 minutes 11 seconds.  The scope was withdrawn and the procedure completed. COMPLICATIONS: There were no complications.  ENDOSCOPIC IMPRESSION: 1. Normal colon and terminal ileum  RECOMMENDATIONS: 1.  Continue  current colorectal screening recommendations for "routine risk" patients with a repeat colonoscopy in 10 years. 2.  Upper endoscopy will be scheduled in [facility] "iron deficiency anemia"   eSigned:  Roxy Cedar, MD 05/06/2013 10:26 AM   cc: [name], MD and The Patient

## 2013-05-06 NOTE — Progress Notes (Signed)
Patient did not experience any of the following events: a burn prior to discharge; a fall within the facility; wrong site/side/patient/procedure/implant event; or a hospital transfer or hospital admission upon discharge from the facility. (G8907) Patient did not have preoperative order for IV antibiotic SSI prophylaxis. (G8918)  

## 2013-05-06 NOTE — Progress Notes (Signed)
A/ox3 pleased with MAC, report to Jane RN 

## 2013-05-06 NOTE — Patient Instructions (Signed)

## 2013-05-09 ENCOUNTER — Telehealth: Payer: Self-pay

## 2013-05-09 NOTE — Telephone Encounter (Signed)
Left message on answering machine. 

## 2013-05-10 ENCOUNTER — Other Ambulatory Visit (INDEPENDENT_AMBULATORY_CARE_PROVIDER_SITE_OTHER): Payer: BC Managed Care – PPO

## 2013-05-10 DIAGNOSIS — E119 Type 2 diabetes mellitus without complications: Secondary | ICD-10-CM

## 2013-05-10 DIAGNOSIS — Z79899 Other long term (current) drug therapy: Secondary | ICD-10-CM

## 2013-05-10 DIAGNOSIS — E785 Hyperlipidemia, unspecified: Secondary | ICD-10-CM

## 2013-05-10 DIAGNOSIS — I10 Essential (primary) hypertension: Secondary | ICD-10-CM

## 2013-05-10 DIAGNOSIS — D649 Anemia, unspecified: Secondary | ICD-10-CM

## 2013-05-10 LAB — CBC WITH DIFFERENTIAL/PLATELET
Basophils Absolute: 0 10*3/uL (ref 0.0–0.1)
Eosinophils Absolute: 0.2 10*3/uL (ref 0.0–0.7)
HCT: 29.4 % — ABNORMAL LOW (ref 36.0–46.0)
Lymphs Abs: 1.9 10*3/uL (ref 0.7–4.0)
MCHC: 32.3 g/dL (ref 30.0–36.0)
MCV: 73.6 fl — ABNORMAL LOW (ref 78.0–100.0)
Monocytes Absolute: 0.3 10*3/uL (ref 0.1–1.0)
Monocytes Relative: 5.8 % (ref 3.0–12.0)
Platelets: 485 10*3/uL — ABNORMAL HIGH (ref 150.0–400.0)
RBC: 4 Mil/uL (ref 3.87–5.11)
RDW: 15.6 % — ABNORMAL HIGH (ref 11.5–14.6)
WBC: 5.7 10*3/uL (ref 4.5–10.5)

## 2013-05-10 LAB — COMPREHENSIVE METABOLIC PANEL
Albumin: 3.8 g/dL (ref 3.5–5.2)
Alkaline Phosphatase: 34 U/L — ABNORMAL LOW (ref 39–117)
BUN: 11 mg/dL (ref 6–23)
CO2: 27 mEq/L (ref 19–32)
Calcium: 9.3 mg/dL (ref 8.4–10.5)
Chloride: 102 mEq/L (ref 96–112)
Creatinine, Ser: 0.6 mg/dL (ref 0.4–1.2)
GFR: 114.12 mL/min (ref >60.00)
Potassium: 3.9 mEq/L (ref 3.5–5.1)
Total Protein: 7.6 g/dL (ref 6.0–8.3)

## 2013-05-10 LAB — LIPID PANEL
Cholesterol: 131 mg/dL (ref 0–200)
Total CHOL/HDL Ratio: 3

## 2013-05-10 LAB — HEMOGLOBIN A1C: Hgb A1c MFr Bld: 7.2 % — ABNORMAL HIGH (ref 4.6–6.5)

## 2013-05-11 ENCOUNTER — Ambulatory Visit (AMBULATORY_SURGERY_CENTER): Payer: Self-pay

## 2013-05-11 VITALS — Ht 59.0 in | Wt 164.0 lb

## 2013-05-11 DIAGNOSIS — D509 Iron deficiency anemia, unspecified: Secondary | ICD-10-CM

## 2013-05-12 ENCOUNTER — Encounter: Payer: Self-pay | Admitting: Internal Medicine

## 2013-05-13 ENCOUNTER — Ambulatory Visit: Payer: BC Managed Care – PPO | Admitting: Family Medicine

## 2013-05-16 ENCOUNTER — Encounter: Payer: Self-pay | Admitting: Family Medicine

## 2013-05-16 ENCOUNTER — Other Ambulatory Visit: Payer: Self-pay | Admitting: Family Medicine

## 2013-05-16 ENCOUNTER — Ambulatory Visit (INDEPENDENT_AMBULATORY_CARE_PROVIDER_SITE_OTHER): Payer: BC Managed Care – PPO | Admitting: Family Medicine

## 2013-05-16 VITALS — BP 160/82 | HR 102 | Temp 97.9°F | Ht 59.5 in | Wt 167.2 lb

## 2013-05-16 DIAGNOSIS — I10 Essential (primary) hypertension: Secondary | ICD-10-CM

## 2013-05-16 DIAGNOSIS — E785 Hyperlipidemia, unspecified: Secondary | ICD-10-CM

## 2013-05-16 DIAGNOSIS — Z23 Encounter for immunization: Secondary | ICD-10-CM

## 2013-05-16 DIAGNOSIS — E119 Type 2 diabetes mellitus without complications: Secondary | ICD-10-CM

## 2013-05-16 MED ORDER — GLIPIZIDE ER 2.5 MG PO TB24: 2.5000 mg | ORAL_TABLET | Freq: Every day | ORAL | Status: DC

## 2013-05-16 NOTE — Assessment & Plan Note (Signed)
Disc goals for lipids and reasons to control them Rev labs with pt Rev low sat fat diet in detail   

## 2013-05-16 NOTE — Assessment & Plan Note (Signed)
bp a bit higher today but per pt in very good control at home  Will continue to monitor No changes Disc healthy lifestyle

## 2013-05-16 NOTE — Progress Notes (Signed)
Subjective:    Patient ID: Martha Hamilton, female    DOB: Feb 15, 1951, 62 y.o.   MRN: [id number]  HPI Here for f/u of chronic medical conditions  bp is up on first check today 2nd check 140/80-- at home is lower- checks it mid day No cp or palpitations or headaches or edema  No side effects to medicines  BP Readings from Last 3 Encounters:  05/16/13 160/82  05/06/13 128/70  02/09/13 138/82     Lab Results  Component Value Date   CHOL 131 05/10/2013   CHOL 134 02/02/2013   CHOL 137 04/28/2012   Lab Results  Component Value Date   HDL 41.10 05/10/2013   HDL 35.50* 02/02/2013   HDL 38.60* 04/28/2012   Lab Results  Component Value Date   LDLCALC 51 10/21/2011   LDLCALC 48 04/29/2010   Lab Results  Component Value Date   TRIG 292.0* 05/10/2013   TRIG 292.0* 02/02/2013   TRIG 218.0* 04/28/2012   Lab Results  Component Value Date   CHOLHDL 3 05/10/2013   CHOLHDL 4 02/02/2013   CHOLHDL 4 04/28/2012   Lab Results  Component Value Date   LDLDIRECT 62.5 05/10/2013   LDLDIRECT 65.3 02/02/2013   LDLDIRECT 76.5 04/28/2012   on zocor 40 and diet  Overall stable   Diabetes- inc metformin last time ( she notes the pill is a bit hard to swallow) Home sugar results - she thinks sugars are still too high  DM diet - she is eating the exact same foods as her mother- DM diet and her A1C is still not at goal  ? If getting enough protein - chops up chicken if they have it  Exercise - is active and also she works out with her Wii Board- 30 minutes twice daily  Sugars are highest in the am  Symptoms-none - just discouraged with her sugar  A1C last  Lab Results  Component Value Date   HGBA1C 7.2* 05/10/2013   No problems with medications  Renal protection on ARB Last eye exam  1/14  Had her colonosc- was ok  Will have EGD - and dilatation Anemic-no cause found yet  Lab Results  Component Value Date   WBC 5.7 05/10/2013   HGB 9.5* 05/10/2013   HCT 29.4* 05/10/2013   MCV 73.6*  05/10/2013   PLT 485.0* 05/10/2013      She does have caregiver stress with her mother - becoming more dependent on her   Patient Active Problem List   Diagnosis Date Noted  . Anemia 02/09/2013  . Abnormal EKG 07/27/2012  . Preoperative clearance 07/27/2012  . Post-menopausal bleeding 05/04/2012  . Parotiditis 12/29/2011  . Special screening for malignant neoplasms, colon 04/29/2011  . Gynecological examination 04/29/2011  . Routine general medical examination at a health care facility 04/20/2011  . Right shoulder pain 11/18/2010  . OSTEOARTHRITIS, HANDS, BILATERAL 10/31/2008  . DIABETES MELLITUS, TYPE II 07/10/2008  . HYPERLIPIDEMIA 01/26/2008  . HYPERTENSION, ESSENTIAL NOS 01/26/2008  . ALLERGIC RHINITIS 12/07/2007   Past Medical History  Diagnosis Date  . Allergic rhinitis   . DM2 (diabetes mellitus, type 2)   . Hypertension   . Osteoarthritis     hands  . Obesity   . Breast cancer   . GERD (gastroesophageal reflux disease)   . Hx of cardiovascular stress test     a. Lex MV 2/14:  EF 69%, no ischemia   Past Surgical History  Procedure Laterality Date  . Gastric restriction  surgery      for reflux  . Appendectomy  1976  . Breast surgery      right breast, lumpectomy w/nodes  . Hysteroscopy w/d&c N/A 10/18/2012    Procedure: DILATATION AND CURETTAGE /HYSTEROSCOPY;  Surgeon: Catalina Antigua, MD;  Location: [facility];  Service: Gynecology;  Laterality: N/A;  . Polypectomy N/A 10/18/2012    Procedure: POLYPECTOMY;  Surgeon: Catalina Antigua, MD;  Location: [facility];  Service: Gynecology;  Laterality: N/A;   History  Substance Use Topics  . Smoking status: Never Smoker   . Smokeless tobacco: Never Used  . Alcohol Use: No   Family History  Problem Relation Age of Onset  . Diabetes Mother   . Heart disease Mother     Pacemaker, CHF  . Stroke Father   . CAD Father 58    Died with MI  . Esophageal cancer Brother   . Colon cancer Neg Hx   . Rectal cancer Neg Hx   .  Stomach cancer Neg Hx    Allergies  Allergen Reactions  . Atorvastatin Other (See Comments)    Muscle cramping  . Cholestyramine Other (See Comments)    REACTION: Muscles tightened up, drew up.  . Niacin Nausea And Vomiting   Current Outpatient Prescriptions on File Prior to Visit  Medication Sig Dispense Refill  . ACCU-CHEK COMPACT PLUS test strip USE TO TEST SUGAR ONCE A DAY FOR DIABETES MELLITUS  100 each  1  . ACCU-CHEK SOFTCLIX LANCETS lancets       . acetaminophen (TYLENOL) 500 MG tablet Take 500 mg by mouth every 6 (six) hours as needed.      Marland Kitchen amitriptyline (ELAVIL) 25 MG tablet Take 1 tablet (25 mg total) by mouth at bedtime.  90 tablet  3  . aspirin 81 MG tablet Take 81 mg by mouth daily.        . Cholecalciferol (VITAMIN D) 1000 UNITS capsule Take 2,000 Units by mouth daily.       . diclofenac sodium (VOLTAREN) 1 % GEL Apply 1 application topically as needed. For arthritis in finger      . fenofibrate (TRICOR) 145 MG tablet TAKE 1 TABLET DAILY  90 tablet  0  . Glucosamine-MSM-Hyaluronic Acd (JOINT HEALTH) 750-375-30 MG TABS Take 2 tablets by mouth daily.      . hydrochlorothiazide (MICROZIDE) 12.5 MG capsule Take 1 capsule (12.5 mg total) by mouth daily.  90 capsule  3  . losartan (COZAAR) 100 MG tablet Take 1 tablet (100 mg total) by mouth daily.  90 tablet  3  . meclizine (ANTIVERT) 25 MG tablet Take 1 tablet (25 mg total) by mouth 3 (three) times daily as needed.  30 tablet  0  . metFORMIN (GLUCOPHAGE) 1000 MG tablet Take 1 tablet (1,000 mg total) by mouth 2 (two) times daily with a meal.  180 tablet  3  . Omega-3 Fatty Acids (FISH OIL) 1200 MG CAPS Take 1 capsule by mouth daily.      Marland Kitchen omeprazole (PRILOSEC) 20 MG capsule Take 1 capsule (20 mg total) by mouth daily.  90 capsule  3  . OVER THE COUNTER MEDICATION Vitalizer Gold without Vitamin  K taking one group of supplement daily.       Marland Kitchen SALINE NASAL SPRAY NA Place 1 spray into the nose daily.       . simvastatin (ZOCOR)  40 MG tablet TAKE 1 TABLET AT BEDTIME  90 tablet  1   No current facility-administered medications  on file prior to visit.    Review of Systems Review of Systems  Constitutional: Negative for fever, appetite change, fatigue and unexpected weight change.  Eyes: Negative for pain and visual disturbance.  Respiratory: Negative for cough and shortness of breath.   Cardiovascular: Negative for cp or palpitations    Gastrointestinal: Negative for nausea, diarrhea and constipation.  Genitourinary: Negative for urgency and frequency.  Skin: Negative for pallor or rash   Neurological: Negative for weakness, light-headedness, numbness and headaches.  Hematological: Negative for adenopathy. Does not bruise/bleed easily.  Psychiatric/Behavioral: Negative for dysphoric mood. The patient is not nervous/anxious.  pos for caregiver fatigue       Objective:   Physical Exam  Constitutional: She appears well-developed and well-nourished. No distress.  obese and well appearing   HENT:  Head: Normocephalic and atraumatic.  Right Ear: External ear normal.  Left Ear: External ear normal.  Mouth/Throat: Oropharynx is clear and moist.  Eyes: Conjunctivae and EOM are normal. Pupils are equal, round, and reactive to light. No scleral icterus.  Neck: Normal range of motion. Neck supple. No JVD present. Carotid bruit is not present. No thyromegaly present.  Cardiovascular: Normal rate, regular rhythm, normal heart sounds and intact distal pulses.  Exam reveals no gallop.   Pulmonary/Chest: Effort normal and breath sounds normal. No respiratory distress. She has no wheezes. She exhibits no tenderness.  Abdominal: Soft. Bowel sounds are normal. She exhibits no distension, no abdominal bruit and no mass. There is no tenderness.  Musculoskeletal: She exhibits no edema.  Lymphadenopathy:    She has no cervical adenopathy.  Neurological: She is alert. She has normal reflexes. No cranial nerve deficit. She exhibits  normal muscle tone. Coordination normal.  Skin: Skin is warm and dry. No rash noted. No erythema. No pallor.  Psychiatric: She has a normal mood and affect.          Assessment & Plan:

## 2013-05-16 NOTE — Patient Instructions (Signed)
Start low dose glipizide in the evening -watch sugar carefully and if low sugars-stop it and let me know  Get protein  with every meal  Watch blood pressure Pneumonia vaccine today Follow up with me in 3 months with labs prior

## 2013-05-16 NOTE — Assessment & Plan Note (Signed)
Adding glipizide 2.5 mg -watching closely for hypoglycemia Continue metformin Rev diet  Wt loss enc Lab and f/u 3 mo

## 2013-05-19 ENCOUNTER — Encounter: Payer: Self-pay | Admitting: Internal Medicine

## 2013-05-19 ENCOUNTER — Ambulatory Visit (AMBULATORY_SURGERY_CENTER): Payer: BC Managed Care – PPO | Admitting: Internal Medicine

## 2013-05-19 VITALS — BP 134/61 | HR 96 | Temp 97.8°F | Resp 25 | Ht 59.0 in | Wt 164.0 lb

## 2013-05-19 DIAGNOSIS — D131 Benign neoplasm of stomach: Secondary | ICD-10-CM

## 2013-05-19 DIAGNOSIS — D509 Iron deficiency anemia, unspecified: Secondary | ICD-10-CM

## 2013-05-19 DIAGNOSIS — K2960 Other gastritis without bleeding: Secondary | ICD-10-CM

## 2013-05-19 DIAGNOSIS — K922 Gastrointestinal hemorrhage, unspecified: Secondary | ICD-10-CM

## 2013-05-19 MED ORDER — SODIUM CHLORIDE 0.9 % IV SOLN: 500.0000 mL | INTRAVENOUS | Status: DC

## 2013-05-19 MED ORDER — FERROUS SULFATE 325 (65 FE) MG PO TBEC: 325.0000 mg | DELAYED_RELEASE_TABLET | Freq: Two times a day (BID) | ORAL | Status: DC

## 2013-05-19 NOTE — Progress Notes (Signed)
Called to room to assist during endoscopic procedure.  Patient ID and intended procedure confirmed with present staff. Received instructions for my participation in the procedure from the performing physician.Called to room to assist during endoscopic procedure.  Patient ID and intended procedure confirmed with present staff. Received instructions for my participation in the procedure from the performing physician. ewm 

## 2013-05-19 NOTE — Patient Instructions (Signed)
YOU HAD AN ENDOSCOPIC PROCEDURE TODAY AT THE [facility]: Refer to the procedure report that was given to you for any specific questions about what was found during the examination.  If the procedure report does not answer your questions, please call your gastroenterologist to clarify.  If you requested that your care partner not be given the details of your procedure findings, then the procedure report has been included in a sealed envelope for you to review at your convenience later.  YOU SHOULD EXPECT: Some feelings of bloating in the abdomen. Passage of more gas than usual.  Walking can help get rid of the air that was put into your GI tract during the procedure and reduce the bloating. If you had a lower endoscopy (such as a colonoscopy or flexible sigmoidoscopy) you may notice spotting of blood in your stool or on the toilet paper. If you underwent a bowel prep for your procedure, then you may not have a normal bowel movement for a few days.  DIET: Your first meal following the procedure should be a light meal and then it is ok to progress to your normal diet.  A half-sandwich or bowl of soup is an example of a good first meal.  Heavy or fried foods are harder to digest and may make you feel nauseous or bloated.  Likewise meals heavy in dairy and vegetables can cause extra gas to form and this can also increase the bloating.  Drink plenty of fluids but you should avoid alcoholic beverages for 24 hours.  ACTIVITY: Your care partner should take you home directly after the procedure.  You should plan to take it easy, moving slowly for the rest of the day.  You can resume normal activity the day after the procedure however you should NOT DRIVE or use heavy machinery for 24 hours (because of the sedation medicines used during the test).    SYMPTOMS TO REPORT IMMEDIATELY: A gastroenterologist can be reached at any hour.  During normal business hours, 8:30 AM to 5:00 PM Monday through Friday,  call [phone].  After hours and on weekends, please call the GI answering service at [phone] who will take a message and have the physician on call contact you.   FFollowing upper endoscopy (EGD)  Vomiting of blood or coffee ground material  New chest pain or pain under the shoulder blades  Painful or persistently difficult swallowing  New shortness of breath  Fever of 100F or higher  Black, tarry-looking stools  FOLLOW UP: If any biopsies were taken you will be contacted by phone or by letter within the next 1-3 weeks.  Call your gastroenterologist if you have not heard about the biopsies in 3 weeks.  Our staff will call the home number listed on your records the next business day following your procedure to check on you and address any questions or concerns that you may have at that time regarding the information given to you following your procedure. This is a courtesy call and so if there is no answer at the home number and we have not heard from you through the emergency physician on call, we will assume that you have returned to your regular daily activities without incident.  SIGNATURES/CONFIDENTIALITY: You and/or your care partner have signed paperwork which will be entered into your electronic medical record.  These signatures attest to the fact that that the information above on your After Visit Summary has been reviewed and is understood.  Full responsibility  of the confidentiality of this discharge information lies with you and/or your care-partner.   Await biopsy results.   Continue omeoprazole daily.  Start Iron 325mg . Twice daily-Dr. Marina Goodell discussed possible side effects with you.  See Dr. Marina Goodell in one month in his office.

## 2013-05-19 NOTE — Op Note (Signed)
[facility] [address] [place], [address]   ENDOSCOPY PROCEDURE REPORT  PATIENT: Martha, Hamilton  MR#: [id number] BIRTHDATE: Dec 12, 1950 , 62  yrs. old GENDER: Female ENDOSCOPIST: Roxy Cedar, MD REFERRED BY:  [name], M.D. PROCEDURE DATE:  05/19/2013 PROCEDURE:  EGD w/ biopsy and EGD w/ control of bleeding ASA CLASS:     Class II INDICATIONS:  Iron deficiency anemia. MEDICATIONS: MAC sedation, administered by CRNA and propofol (Diprivan) 300mg  IV TOPICAL ANESTHETIC: Cetacaine Spray  DESCRIPTION OF PROCEDURE: After the risks benefits and alternatives of the procedure were thoroughly explained, informed consent was obtained.  The LB [id number] endoscope was introduced through the mouth and advanced to the second portion of the duodenum. Without limitations.  The instrument was slowly withdrawn as the mucosa was fully examined.    EXAM:The esophagus was normal.  The stomach revealed evidence of prior fundoplication and a few benign fundic gland-type polyps. The most significant finding was in the pyloric channel.  There was a friable polypoid lesion, measuring 1 cm, which was actively oozing moderately.  The duodenal bulb and post bulbar duodenum were normal.  The lesion was biopsied.  Moderate oozing persisted.  The lesion was cauterized with hot biopsy forceps.  Still with persistent oozing which was subsequently controlled with Endo Clip placement x2.  Retroflexed views revealed no abnormalities.     The scope was then withdrawn from the patient and the procedure completed.  COMPLICATIONS: There were no complications. ENDOSCOPIC IMPRESSION: 1. Bleeding pyloric channel polypoid lesion. Highly likely the cause for her anemia. Status post endoscopic hemostatic therapy 2. Status post fundoplication.  RECOMMENDATIONS: 1.  Continue have resolved daily 2. Iron sulfate 325 mg by mouth twice a day; #60; 3 refills 3.  Await biopsy results 4.  OP follow-up with Dr. Marina Goodell in 4 weeks.  REPEAT EXAM:  eSigned:  Roxy Cedar, MD 05/19/2013 4:22 PM  FA:[id number] Fransisca Connors, MD and The Patient

## 2013-05-19 NOTE — Progress Notes (Signed)
Patient did not experience any of the following events: a burn prior to discharge; a fall within the facility; wrong site/side/patient/procedure/implant event; or a hospital transfer or hospital admission upon discharge from the facility. (G8907) Patient did not have preoperative order for IV antibiotic SSI prophylaxis. (G8918)  

## 2013-05-20 ENCOUNTER — Telehealth: Payer: Self-pay | Admitting: *Deleted

## 2013-05-20 NOTE — Telephone Encounter (Signed)
  Follow up Call-  Call back number 05/19/2013 05/06/2013  Post procedure Call Back phone  # [phone]  Permission to leave phone message Yes Yes     Patient questions:  Do you have a fever, pain , or abdominal swelling? no Pain Score  0 *  Have you tolerated food without any problems? yes  Have you been able to return to your normal activities? yes  Do you have any questions about your discharge instructions: Diet   no Medications  no Follow up visit  no  Do you have questions or concerns about your Care? no  Actions: * If pain score is 4 or above: No action needed, pain <4.

## 2013-05-25 ENCOUNTER — Encounter: Payer: Self-pay | Admitting: Internal Medicine

## 2013-06-08 ENCOUNTER — Emergency Department (HOSPITAL_COMMUNITY)
Admission: EM | Admit: 2013-06-08 | Discharge: 2013-06-08 | Disposition: A | Discharge status: Home / Self Care | Payer: BC Managed Care – PPO | Attending: Emergency Medicine | Admitting: Emergency Medicine

## 2013-06-08 ENCOUNTER — Encounter (HOSPITAL_COMMUNITY): Payer: Self-pay | Admitting: Emergency Medicine

## 2013-06-08 ENCOUNTER — Emergency Department (HOSPITAL_COMMUNITY): Payer: BC Managed Care – PPO

## 2013-06-08 DIAGNOSIS — W010XXA Fall on same level from slipping, tripping and stumbling without subsequent striking against object, initial encounter: Secondary | ICD-10-CM | POA: Insufficient documentation

## 2013-06-08 DIAGNOSIS — W19XXXA Unspecified fall, initial encounter: Secondary | ICD-10-CM

## 2013-06-08 DIAGNOSIS — S63509A Unspecified sprain of unspecified wrist, initial encounter: Secondary | ICD-10-CM | POA: Insufficient documentation

## 2013-06-08 DIAGNOSIS — M25512 Pain in left shoulder: Secondary | ICD-10-CM

## 2013-06-08 DIAGNOSIS — Y929 Unspecified place or not applicable: Secondary | ICD-10-CM | POA: Insufficient documentation

## 2013-06-08 DIAGNOSIS — I10 Essential (primary) hypertension: Secondary | ICD-10-CM | POA: Insufficient documentation

## 2013-06-08 DIAGNOSIS — Y939 Activity, unspecified: Secondary | ICD-10-CM | POA: Insufficient documentation

## 2013-06-08 DIAGNOSIS — E669 Obesity, unspecified: Secondary | ICD-10-CM | POA: Insufficient documentation

## 2013-06-08 DIAGNOSIS — Z7982 Long term (current) use of aspirin: Secondary | ICD-10-CM | POA: Insufficient documentation

## 2013-06-08 DIAGNOSIS — E119 Type 2 diabetes mellitus without complications: Secondary | ICD-10-CM | POA: Insufficient documentation

## 2013-06-08 DIAGNOSIS — S46909A Unspecified injury of unspecified muscle, fascia and tendon at shoulder and upper arm level, unspecified arm, initial encounter: Secondary | ICD-10-CM | POA: Insufficient documentation

## 2013-06-08 DIAGNOSIS — S4980XA Other specified injuries of shoulder and upper arm, unspecified arm, initial encounter: Secondary | ICD-10-CM | POA: Insufficient documentation

## 2013-06-08 DIAGNOSIS — M19049 Primary osteoarthritis, unspecified hand: Secondary | ICD-10-CM | POA: Insufficient documentation

## 2013-06-08 DIAGNOSIS — Z8709 Personal history of other diseases of the respiratory system: Secondary | ICD-10-CM | POA: Insufficient documentation

## 2013-06-08 DIAGNOSIS — K219 Gastro-esophageal reflux disease without esophagitis: Secondary | ICD-10-CM | POA: Insufficient documentation

## 2013-06-08 DIAGNOSIS — Z79899 Other long term (current) drug therapy: Secondary | ICD-10-CM | POA: Insufficient documentation

## 2013-06-08 DIAGNOSIS — Z853 Personal history of malignant neoplasm of breast: Secondary | ICD-10-CM | POA: Insufficient documentation

## 2013-06-08 DIAGNOSIS — R000 Tachycardia, unspecified: Secondary | ICD-10-CM | POA: Insufficient documentation

## 2013-06-08 DIAGNOSIS — S40019A Contusion of unspecified shoulder, initial encounter: Secondary | ICD-10-CM | POA: Insufficient documentation

## 2013-06-08 MED ORDER — HYDROCODONE-ACETAMINOPHEN 5-325 MG PO TABS: 1.0000 | ORAL_TABLET | ORAL | Status: DC | PRN

## 2013-06-08 MED ORDER — MORPHINE SULFATE 4 MG/ML IJ SOLN
4.0000 mg | Freq: Once | INTRAMUSCULAR | Status: AC
  Administered 2013-06-08: 4 mg via INTRAVENOUS
  Filled 2013-06-08: qty 1

## 2013-06-08 MED ORDER — ONDANSETRON HCL 4 MG/2ML IJ SOLN
4.0000 mg | Freq: Once | INTRAMUSCULAR | Status: AC
  Administered 2013-06-08: 4 mg via INTRAVENOUS
  Filled 2013-06-08: qty 2

## 2013-06-08 NOTE — ED Notes (Signed)
Pt c/o trip and fall today and now c/o left shoulder pain and unable to move without severe pain; pt appears diaphoretic and uncomfortable

## 2013-06-08 NOTE — ED Provider Notes (Signed)
CSN: [id number]     Arrival date & time 06/08/13  1747 History   First MD Initiated Contact with Patient 06/08/13 1833     Chief Complaint  Patient presents with  . Shoulder Pain  . Fall   (Consider location/radiation/quality/duration/timing/severity/associated sxs/prior Treatment) HPI Comments: 62 yo female with hx of HTN and DM presents after mechanical trip and fall, landing on her left shoulder. Now having severe left shoulder and left wrist pain, unable to range the shoulder at all due to pain. Denies head injury and LOC. Denies recent illness, chest pain, dizziness, numbness/tingling.  Patient is a 62 y.o. female presenting with fall. The history is provided by the patient.  Fall Associated symptoms include joint swelling ( left shoulder). Pertinent negatives include no abdominal pain, chest pain, coughing, fever, headaches, rash, sore throat or vomiting.    Past Medical History  Diagnosis Date  . Allergic rhinitis   . DM2 (diabetes mellitus, type 2)   . Hypertension   . Osteoarthritis     hands  . Obesity   . Breast cancer   . GERD (gastroesophageal reflux disease)   . Hx of cardiovascular stress test     a. Lex MV 2/14:  EF 69%, no ischemia   Past Surgical History  Procedure Laterality Date  . Gastric restriction surgery      for reflux  . Appendectomy  1976  . Breast surgery      right breast, lumpectomy w/nodes  . Hysteroscopy w/d&c N/A 10/18/2012    Procedure: DILATATION AND CURETTAGE /HYSTEROSCOPY;  Surgeon: Catalina Antigua, MD;  Location: [facility];  Service: Gynecology;  Laterality: N/A;  . Polypectomy N/A 10/18/2012    Procedure: POLYPECTOMY;  Surgeon: Catalina Antigua, MD;  Location: [facility];  Service: Gynecology;  Laterality: N/A;   Family History  Problem Relation Age of Onset  . Diabetes Mother   . Heart disease Mother     Pacemaker, CHF  . Stroke Father   . CAD Father 43    Died with MI  . Esophageal cancer Brother   . Colon cancer Neg Hx   . Rectal cancer  Neg Hx   . Stomach cancer Neg Hx    History  Substance Use Topics  . Smoking status: Never Smoker   . Smokeless tobacco: Never Used  . Alcohol Use: No   OB History   Grav Para Term Preterm Abortions TAB SAB Ect Mult Living   0 0 0 0 0 0 0 0 0 0      Review of Systems  Constitutional: Negative for fever.  HENT: Negative for rhinorrhea and sore throat.   Eyes: Negative for visual disturbance.  Respiratory: Negative for cough.   Cardiovascular: Negative for chest pain, palpitations and leg swelling.  Gastrointestinal: Negative for vomiting and abdominal pain.  Genitourinary: Negative for difficulty urinating.  Musculoskeletal: Positive for joint swelling ( left shoulder). Negative for back pain and gait problem.  Skin: Negative for rash.  Neurological: Negative for headaches.  Hematological: Negative for adenopathy.  Psychiatric/Behavioral: Negative for agitation.    Allergies  Atorvastatin; Cholestyramine; and Niacin  Home Medications   Current Outpatient Rx  Name  Route  Sig  Dispense  Refill  . acetaminophen (TYLENOL) 500 MG tablet   Oral   Take 500 mg by mouth every 6 (six) hours as needed for mild pain.          Marland Kitchen amitriptyline (ELAVIL) 25 MG tablet   Oral   Take 1 tablet (25 mg  total) by mouth at bedtime.   90 tablet   3   . aspirin 81 MG chewable tablet   Oral   Chew 81 mg by mouth daily.         . Cholecalciferol (VITAMIN D) 1000 UNITS capsule   Oral   Take 2,000 Units by mouth daily.          . diclofenac sodium (VOLTAREN) 1 % GEL   Topical   Apply 1 application topically as needed. For arthritis in finger         . fenofibrate (TRICOR) 145 MG tablet   Oral   Take 145 mg by mouth daily.         . ferrous sulfate 325 (65 FE) MG EC tablet   Oral   Take 1 tablet (325 mg total) by mouth 2 (two) times daily.   60 tablet   3   . glipiZIDE (GLUCOTROL XL) 2.5 MG 24 hr tablet   Oral   Take 1 tablet (2.5 mg total) by mouth daily. Take in the  evening   30 tablet   5   . Glucosamine-MSM-Hyaluronic Acd (JOINT HEALTH) 750-375-30 MG TABS   Oral   Take 2 tablets by mouth daily.         . hydrochlorothiazide (MICROZIDE) 12.5 MG capsule   Oral   Take 1 capsule (12.5 mg total) by mouth daily.   90 capsule   3   . losartan (COZAAR) 100 MG tablet   Oral   Take 1 tablet (100 mg total) by mouth daily.   90 tablet   3   . meclizine (ANTIVERT) 25 MG tablet   Oral   Take 1 tablet (25 mg total) by mouth 3 (three) times daily as needed.   30 tablet   0   . metFORMIN (GLUCOPHAGE) 1000 MG tablet   Oral   Take 1 tablet (1,000 mg total) by mouth 2 (two) times daily with a meal.   180 tablet   3   . Omega-3 Fatty Acids (FISH OIL) 1200 MG CAPS   Oral   Take 1 capsule by mouth daily.         Marland Kitchen omeprazole (PRILOSEC) 20 MG capsule   Oral   Take 1 capsule (20 mg total) by mouth daily.   90 capsule   3   . OVER THE COUNTER MEDICATION      Vitalizer Gold without Vitamin  K taking one group of supplement daily.          Marland Kitchen SALINE NASAL SPRAY NA   Nasal   Place 1 spray into the nose daily.          . simvastatin (ZOCOR) 40 MG tablet   Oral   Take 40 mg by mouth daily at 6 PM.         . ACCU-CHEK COMPACT PLUS test strip      USE TO TEST SUGAR ONCE A DAY FOR DIABETES MELLITUS   100 each   1   . ACCU-CHEK SOFTCLIX LANCETS lancets                BP 162/108  Pulse 120  Temp(Src) 98.3 F (36.8 C) (Oral)  Resp 18  SpO2 100%  LMP 06/13/2001 Physical Exam  Nursing note and vitals reviewed. Constitutional: She is oriented to person, place, and time. She appears well-developed and well-nourished. She appears distressed ( in pain).  HENT:  Head: Normocephalic and atraumatic.  Right Ear: External ear  normal.  Left Ear: External ear normal.  Eyes: EOM are normal.  Neck: Normal range of motion and full passive range of motion without pain. Neck supple. No spinous process tenderness and no muscular tenderness  present.  Cardiovascular: Regular rhythm, normal heart sounds and intact distal pulses.  Tachycardia present.   Pulmonary/Chest: Effort normal and breath sounds normal. She exhibits no tenderness.  Abdominal: Soft. There is no tenderness.  Musculoskeletal:       Left shoulder: She exhibits decreased range of motion, tenderness, bony tenderness, swelling, deformity (slight, proximal humerus) and pain. She exhibits normal pulse.  Neurological: She is alert and oriented to person, place, and time. She has normal strength. No sensory deficit.  Skin: Skin is warm and dry.  Psychiatric: She has a normal mood and affect.    ED Course  Procedures (including critical care time) Labs Review Labs Reviewed - No data to display Imaging Review No results found.  EKG Interpretation   None       MDM   1. Left shoulder pain    Left shoulder with ecchymosis and unable to perform any ROM. Neurovascularly intact distally. Suspect prox humerus fracture. Xrays ordered. Pain control with morphine/zofran.  Report to Levonne Lapping, xrays pending.    Simmie Davies, NP 06/08/13 2045

## 2013-06-08 NOTE — ED Notes (Signed)
Patient transported to X-ray 

## 2013-06-08 NOTE — ED Provider Notes (Signed)
Martha Hamilton 8:00 PM patient discussed in signout. Patient is a 62 year old female with mechanical fall leading on left shoulder. Concern for proximal humerus fracture. X-rays pending.  X-rays reviewed with patient and family. She is feeling much better after pain medication. X-rays do not show any broken bones or dislocations from fall and injury. Patient is tender over the wrist. She continues to have some pain with range of motion. No cervical midline pain. Normal range of motion of the neck. At this time discussed treatment plan with rest with a sling and wrist splint. Patient will use NSAID at home with a small prescription for Norco for pain. [name] been instructed to followup with her doctor on Monday and agrees.    Dg Chest 1 View  06/08/2013   CLINICAL DATA:  Fall.  EXAM: CHEST - 1 VIEW  COMPARISON:  None.  FINDINGS: Trachea is deviated slightly to the right, which can be seen with left thyroid enlargement. Heart size normal. Lungs are clear. No pleural fluid. No pneumothorax. Right hemidiaphragm is slightly elevated. Surgical clips in the right axilla.  IMPRESSION: No acute findings.   Electronically Signed   By: Leanna Battles M.D.   On: 06/08/2013 20:13   Dg Elbow 2 Views Left  06/08/2013   CLINICAL DATA:  Fall with elbow pain.  EXAM: LEFT ELBOW - 2 VIEW  COMPARISON:  None.  FINDINGS: No definite fracture.  IMPRESSION: No definite fracture.   Electronically Signed   By: Leanna Battles M.D.   On: 06/08/2013 20:14   Dg Wrist Complete Left  06/08/2013   CLINICAL DATA:  Fall with pain.  EXAM: LEFT WRIST - COMPLETE 3+ VIEW  COMPARISON:  None.  FINDINGS: No acute osseous or joint abnormality. Degenerative changes at the 1st carpometacarpal and scaphoid trapezium trapezoid joints, mild.  IMPRESSION: No acute osseous or joint abnormality.   Electronically Signed   By: Leanna Battles M.D.   On: 06/08/2013 20:15   Dg Shoulder Left  06/08/2013   CLINICAL DATA:  Fall with left shoulder pain.   EXAM: LEFT SHOULDER - 2+ VIEW  COMPARISON:  None.  FINDINGS: No acute osseous or joint abnormality. Humeral head appears slightly high-riding.  IMPRESSION: No acute findings.   Electronically Signed   By: Leanna Battles M.D.   On: 06/08/2013 20:12      Angus Seller, PA-C 06/08/13 2051

## 2013-06-08 NOTE — Progress Notes (Signed)
Orthopedic Tech Progress Note Patient Details:  Martha Hamilton Aug 02, 1950 [id number]  Ortho Devices Type of Ortho Device: Arm sling;Velcro wrist splint Ortho Device/Splint Location: LUE Ortho Device/Splint Interventions: Ordered;Application   Jennye Moccasin 06/08/2013, 8:55 PM

## 2013-06-08 NOTE — ED Notes (Signed)
Ortho at bedside.

## 2013-06-08 NOTE — ED Notes (Signed)
Patient returned from xray.

## 2013-06-08 NOTE — ED Provider Notes (Signed)
Medical screening examination/treatment/procedure(s) were performed by non-physician practitioner and as supervising physician I was immediately available for consultation/collaboration.  EKG Interpretation   None        Doug Sou, MD 06/08/13 2113

## 2013-06-08 NOTE — ED Notes (Signed)
Ortho paged. 

## 2013-06-09 NOTE — ED Provider Notes (Signed)
Medical screening examination/treatment/procedure(s) were performed by non-physician practitioner and as supervising physician I was immediately available for consultation/collaboration.  EKG Interpretation   None         Junius Argyle, MD 06/09/13 1552

## 2013-06-14 ENCOUNTER — Ambulatory Visit (INDEPENDENT_AMBULATORY_CARE_PROVIDER_SITE_OTHER): Payer: BC Managed Care – PPO | Admitting: Family Medicine

## 2013-06-14 ENCOUNTER — Encounter: Payer: Self-pay | Admitting: Family Medicine

## 2013-06-14 VITALS — BP 138/82 | HR 115 | Temp 98.5°F | Ht 60.25 in | Wt 166.5 lb

## 2013-06-14 DIAGNOSIS — M25532 Pain in left wrist: Secondary | ICD-10-CM

## 2013-06-14 DIAGNOSIS — M25539 Pain in unspecified wrist: Secondary | ICD-10-CM

## 2013-06-14 DIAGNOSIS — M25512 Pain in left shoulder: Secondary | ICD-10-CM

## 2013-06-14 DIAGNOSIS — M25519 Pain in unspecified shoulder: Secondary | ICD-10-CM | POA: Insufficient documentation

## 2013-06-14 NOTE — Progress Notes (Signed)
   Subjective:    Patient ID: Martha Hamilton, female    DOB: 1951-01-05, 61 y.o.   MRN: [id number]  HPI Here after a fall and arm injury   She tripped on a step on the bathroom floor (slipped also)- and landed on her L side - fell on it with elbow bent across her body  Went to ER the day before Thanksgiving   Given narcotic and she did not fill it - took tylenol instead  She wants to avoid that   Had elbow xray neg Wrist xray neg Shoulder xray neg   She is getting much better  She has much better rom of the whole arm and can lift it over her head   Wearing a wrist splint L   Is R handed   Falling is unusual for her - she will fix a new bathroom mat now to prevent slips  Review of Systems Review of Systems  Constitutional: Negative for fever, appetite change, fatigue and unexpected weight change.  Eyes: Negative for pain and visual disturbance.  Respiratory: Negative for cough and shortness of breath.   Cardiovascular: Negative for cp or palpitations    Gastrointestinal: Negative for nausea, diarrhea and constipation.  Genitourinary: Negative for urgency and frequency.  Skin: Negative for pallor or rash   MSK pos for L arm pain that is improved and neg for swelling  Neurological: Negative for weakness, light-headedness, numbness and headaches.  Hematological: Negative for adenopathy. Does not bruise/bleed easily.  Psychiatric/Behavioral: Negative for dysphoric mood. The patient is not nervous/anxious.         Objective:   Physical Exam  Constitutional: She appears well-developed and well-nourished. No distress.  obese and well appearing   HENT:  Head: Normocephalic and atraumatic.  Eyes: Conjunctivae and EOM are normal. Pupils are equal, round, and reactive to light. No scleral icterus.  Neck: Normal range of motion. Neck supple. No JVD present. No tracheal deviation present. No thyromegaly present.  Cardiovascular: Normal rate, regular rhythm and intact distal pulses.     Rate of 90 at rest  Pulmonary/Chest: Effort normal and breath sounds normal.  Musculoskeletal: She exhibits tenderness. She exhibits no edema.       Left shoulder: She exhibits decreased range of motion and tenderness. She exhibits no bony tenderness, no swelling, no effusion, no crepitus, no deformity and normal strength.       Left elbow: She exhibits normal range of motion, no swelling, no effusion and no deformity. No tenderness found.       Left wrist: She exhibits tenderness. She exhibits normal range of motion, no swelling, no effusion and no crepitus.  Mild tenderness over L bicep tendon Nl passive rom shoulder  Active rom- can abduct almost completely and int/ext rotate hawkings test pos for pain in deltoid area Nl neer test   Nl rom wrist slt tenderness over distal ulna without swelling or ecchymosis Nl grip  Fine without brace     no neurol changes   Lymphadenopathy:    She has no cervical adenopathy.  Neurological: She is alert. She has normal reflexes. She displays no atrophy. No cranial nerve deficit or sensory deficit. She exhibits normal muscle tone. Coordination normal.          Assessment & Plan:

## 2013-06-14 NOTE — Progress Notes (Signed)
Pre-visit discussion using our clinic review tool. No additional management support is needed unless otherwise documented below in the visit note.  

## 2013-06-14 NOTE — Assessment & Plan Note (Addendum)
S/p fall and trauma Much imp rom today , less tender and swelling is gone  Will start the finger crawl/ wall rom exercise Given handout with 2 other exercises to graduate to when ready- disc prev of frozen shoulder Will continue ice Tylenol prn Update if no further imp in 2 wk  Rev hosp records and xrays with pt in detail today

## 2013-06-14 NOTE — Assessment & Plan Note (Signed)
Nl rom  Very slt tenderness at distal ulna  No swelling  Much imp from time of fall inst to take off brace/ ice as needed Update if symptoms worsen  ER records/ xrays rev in detail with pt today for fall on 11/26

## 2013-06-14 NOTE — Patient Instructions (Signed)
You can take off the wrist splint if it feels ok  Keep using ice on shoulder several times per day Do the shoulder exercise (finger crawl up the wall)- 2-3 times per day to keep range of motion  If no further improvement in 2 weeks let me know

## 2013-06-15 ENCOUNTER — Encounter: Payer: Self-pay | Admitting: Internal Medicine

## 2013-06-15 ENCOUNTER — Other Ambulatory Visit: Payer: Self-pay

## 2013-06-15 ENCOUNTER — Other Ambulatory Visit (INDEPENDENT_AMBULATORY_CARE_PROVIDER_SITE_OTHER): Payer: BC Managed Care – PPO

## 2013-06-15 ENCOUNTER — Ambulatory Visit (INDEPENDENT_AMBULATORY_CARE_PROVIDER_SITE_OTHER): Payer: BC Managed Care – PPO | Admitting: Internal Medicine

## 2013-06-15 VITALS — BP 124/62 | HR 108 | Ht 59.5 in | Wt 166.2 lb

## 2013-06-15 DIAGNOSIS — D62 Acute posthemorrhagic anemia: Secondary | ICD-10-CM

## 2013-06-15 DIAGNOSIS — D131 Benign neoplasm of stomach: Secondary | ICD-10-CM

## 2013-06-15 DIAGNOSIS — K219 Gastro-esophageal reflux disease without esophagitis: Secondary | ICD-10-CM

## 2013-06-15 DIAGNOSIS — D649 Anemia, unspecified: Secondary | ICD-10-CM

## 2013-06-15 LAB — CBC WITH DIFFERENTIAL/PLATELET
Basophils Absolute: 0 10*3/uL (ref 0.0–0.1)
Eosinophils Relative: 2.1 % (ref 0.0–5.0)
HCT: 30.3 % — ABNORMAL LOW (ref 36.0–46.0)
Hemoglobin: 9.7 g/dL — ABNORMAL LOW (ref 12.0–15.0)
Lymphs Abs: 2.1 10*3/uL (ref 0.7–4.0)
MCHC: 32 g/dL (ref 30.0–36.0)
MCV: 73.6 fl — ABNORMAL LOW (ref 78.0–100.0)
Monocytes Absolute: 0.4 10*3/uL (ref 0.1–1.0)
Monocytes Relative: 7.3 % (ref 3.0–12.0)
Neutro Abs: 3.1 10*3/uL (ref 1.4–7.7)
Neutrophils Relative %: 53.8 % (ref 43.0–77.0)
RDW: 18.4 % — ABNORMAL HIGH (ref 11.5–14.6)

## 2013-06-15 NOTE — Progress Notes (Signed)
HISTORY OF PRESENT ILLNESS:  Martha Hamilton is a 62 y.o. female with multiple medical problems as listed below. She was seen recently regarding progressive microcytic anemia. She underwent complete colonoscopy 05/06/2013. This was normal including intubation of the terminal ileum. She subsequently underwent upper endoscopy 05/19/2013. She was found to have a spontaneously bleeding pyloric channel polypoid lesion. This was treated with cauterization and Endo Clip. Biopsies revealed benign inflammatory tissue. She was placed on iron twice daily and asked to followup at this time. Since her endoscopy she reports feeling well. No dark stools. No active GI complaints. Tolerating iron without difficulty. In addition to her other medications she continues on aspirin and omeprazole.  REVIEW OF SYSTEMS:  All non-GI ROS negative except for arthritis, fatigue, sinus and allergy  Past Medical History  Diagnosis Date  . Allergic rhinitis   . DM2 (diabetes mellitus, type 2)   . Hypertension   . Osteoarthritis     hands  . Obesity   . Breast cancer   . GERD (gastroesophageal reflux disease)   . Hx of cardiovascular stress test     a. Lex MV 2/14:  EF 69%, no ischemia  . Iron deficiency anemia     Past Surgical History  Procedure Laterality Date  . Gastric restriction surgery      for reflux  . Appendectomy  1976  . Breast lumpectomy Right     right breast, lumpectomy w/nodes  . Hysteroscopy w/d&c N/A 10/18/2012    Procedure: DILATATION AND CURETTAGE /HYSTEROSCOPY;  Surgeon: Catalina Antigua, MD;  Location: [facility];  Service: Gynecology;  Laterality: N/A;  . Polypectomy N/A 10/18/2012    Procedure: POLYPECTOMY;  Surgeon: Catalina Antigua, MD;  Location: [facility];  Service: Gynecology;  Laterality: N/A;    Social History Martha Hamilton  reports that she has never smoked. She has never used smokeless tobacco. She reports that she does not drink alcohol or use illicit drugs.  family history includes CAD (age of  onset: 69) in her father; Diabetes in her mother and sister; Esophageal cancer in her brother; Heart disease in her mother and sister; Hypertension in her mother; Kidney cancer in her mother; Stroke in her father. There is no history of Colon cancer, Rectal cancer, or Stomach cancer.  Allergies  Allergen Reactions  . Cholestyramine Other (See Comments)    REACTION: Muscles tightened up, drew up.  . Lipitor [Atorvastatin] Other (See Comments)    Muscle cramping  . Niacin Nausea And Vomiting       PHYSICAL EXAMINATION: Vital signs: BP 124/62  Pulse 108  Ht 4' 11.5" (1.511 m)  Wt 166 lb 4 oz (75.411 kg)  BMI 33.03 kg/m2  LMP 06/13/2001 General: Well-developed, well-nourished, no acute distress HEENT: Sclerae are anicteric, conjunctiva pink. Oral mucosa intact Lungs: Clear Heart: Regular Abdomen: soft, nontender, nondistended, no obvious ascites, no peritoneal signs, normal bowel sounds. No organomegaly. Extremities: No edema Psychiatric: alert and oriented x3. Cooperative   ASSESSMENT:  #1. Microcytic anemia felt secondary to GI blood loss from bleeding benign inflammatory polypoid pyloric channel lesion. Status post endoscopic hemostatic therapy. No clinical evidence of active bleeding. #2. GERD. Asymptomatic on PPI  PLAN:  #1. Repeat CBC today #2. Continue iron therapy #3. If no response to iron therapy, consider relook endoscopy. If no evidence for ongoing GI bleeding, then oncology input regarding persistent anemia on iron.

## 2013-06-15 NOTE — Patient Instructions (Signed)
Your physician has requested that you go to the basement for the following lab work before leaving today:  CBC  Please follow up as needed 

## 2013-06-30 ENCOUNTER — Other Ambulatory Visit: Payer: Self-pay | Admitting: Family Medicine

## 2013-07-04 ENCOUNTER — Other Ambulatory Visit: Payer: Self-pay | Admitting: Family Medicine

## 2013-07-14 ENCOUNTER — Encounter: Payer: Self-pay | Admitting: Internal Medicine

## 2013-07-14 DIAGNOSIS — D509 Iron deficiency anemia, unspecified: Secondary | ICD-10-CM

## 2013-07-15 MED ORDER — FERROUS SULFATE 325 (65 FE) MG PO TBEC: 325.0000 mg | DELAYED_RELEASE_TABLET | Freq: Three times a day (TID) | ORAL | Status: DC

## 2013-07-15 NOTE — Telephone Encounter (Signed)
Informed pt Dr Henrene Pastor stated she is correct on the amount of Iron she is to take. OK to order from [facility].

## 2013-07-15 NOTE — Telephone Encounter (Signed)
Dr Henrene Pastor, I have reviewed the OV and [facility] as well as the AVS for encounter on 06/15/13 and cannot find new orders for Ferrous Sulfate to be increased to TID. Please advise. Thanks.

## 2013-07-18 ENCOUNTER — Other Ambulatory Visit: Payer: Self-pay | Admitting: Family Medicine

## 2013-07-19 ENCOUNTER — Telehealth: Payer: Self-pay

## 2013-07-19 NOTE — Telephone Encounter (Signed)
Pt aware and will come for labs. 

## 2013-07-19 NOTE — Telephone Encounter (Signed)
Message copied by Algernon Huxley on Tue Jul 19, 2013  1:45 PM ------      Message from: Irene Shipper      Created: Tue Jul 19, 2013 12:10 PM      Regarding: RE: Labs       Yes, add ferritin along with CBC      ----- Message -----         From: Maury Dus, RN         Sent: 07/19/2013  10:45 AM           To: Irene Shipper, MD      Subject: FW: Labs                                                 Dr. Henrene Pastor,            This pt is due to have a CBC checked. Do you want any other labs on her at this time?            Thanks,      Vaughan Basta       ----- Message -----         From: Maury Dus, RN         Sent: 07/18/2013           To: Maury Dus, RN      Subject: Labs                                                     Needs labs, orders in.             ------

## 2013-07-20 ENCOUNTER — Other Ambulatory Visit: Payer: Self-pay

## 2013-07-20 ENCOUNTER — Other Ambulatory Visit (INDEPENDENT_AMBULATORY_CARE_PROVIDER_SITE_OTHER): Payer: BC Managed Care – PPO

## 2013-07-20 DIAGNOSIS — D649 Anemia, unspecified: Secondary | ICD-10-CM

## 2013-07-20 LAB — CBC WITH DIFFERENTIAL/PLATELET
BASOS PCT: 1.9 % (ref 0.0–3.0)
Basophils Absolute: 0.1 10*3/uL (ref 0.0–0.1)
Eosinophils Absolute: 0.2 10*3/uL (ref 0.0–0.7)
Eosinophils Relative: 2.5 % (ref 0.0–5.0)
HCT: 34.4 % — ABNORMAL LOW (ref 36.0–46.0)
HEMOGLOBIN: 11.2 g/dL — AB (ref 12.0–15.0)
LYMPHS PCT: 32 % (ref 12.0–46.0)
Lymphs Abs: 2 10*3/uL (ref 0.7–4.0)
MCHC: 32.7 g/dL (ref 30.0–36.0)
MCV: 76.6 fl — ABNORMAL LOW (ref 78.0–100.0)
MONOS PCT: 5.6 % (ref 3.0–12.0)
Monocytes Absolute: 0.4 10*3/uL (ref 0.1–1.0)
Neutro Abs: 3.7 10*3/uL (ref 1.4–7.7)
Neutrophils Relative %: 58 % (ref 43.0–77.0)
Platelets: 421 10*3/uL — ABNORMAL HIGH (ref 150.0–400.0)
RBC: 4.49 Mil/uL (ref 3.87–5.11)
RDW: 20.3 % — ABNORMAL HIGH (ref 11.5–14.6)
WBC: 6.4 10*3/uL (ref 4.5–10.5)

## 2013-07-20 LAB — FERRITIN: Ferritin: 8.5 ng/mL — ABNORMAL LOW (ref 10.0–291.0)

## 2013-08-02 ENCOUNTER — Other Ambulatory Visit: Payer: Self-pay | Admitting: Cardiology

## 2013-08-05 ENCOUNTER — Telehealth: Payer: Self-pay | Admitting: Internal Medicine

## 2013-08-05 ENCOUNTER — Telehealth: Payer: Self-pay

## 2013-08-05 DIAGNOSIS — D509 Iron deficiency anemia, unspecified: Secondary | ICD-10-CM

## 2013-08-05 MED ORDER — FERROUS SULFATE 325 (65 FE) MG PO TBEC: 325.0000 mg | DELAYED_RELEASE_TABLET | Freq: Three times a day (TID) | ORAL | Status: DC

## 2013-08-05 NOTE — Telephone Encounter (Signed)
Sent rx for ferrous sulfate to [facility] - is no longer covered under insurance so will not go to [facility] anymore

## 2013-08-09 ENCOUNTER — Other Ambulatory Visit (INDEPENDENT_AMBULATORY_CARE_PROVIDER_SITE_OTHER): Payer: BC Managed Care – PPO

## 2013-08-09 DIAGNOSIS — E119 Type 2 diabetes mellitus without complications: Secondary | ICD-10-CM

## 2013-08-09 DIAGNOSIS — E785 Hyperlipidemia, unspecified: Secondary | ICD-10-CM

## 2013-08-09 DIAGNOSIS — D649 Anemia, unspecified: Secondary | ICD-10-CM

## 2013-08-09 DIAGNOSIS — I10 Essential (primary) hypertension: Secondary | ICD-10-CM

## 2013-08-09 LAB — COMPREHENSIVE METABOLIC PANEL
ALT: 21 U/L (ref 0–35)
AST: 19 U/L (ref 0–37)
Albumin: 3.8 g/dL (ref 3.5–5.2)
Alkaline Phosphatase: 30 U/L — ABNORMAL LOW (ref 39–117)
BUN: 12 mg/dL (ref 6–23)
CO2: 27 meq/L (ref 19–32)
Calcium: 9.1 mg/dL (ref 8.4–10.5)
Chloride: 105 mEq/L (ref 96–112)
Creatinine, Ser: 0.6 mg/dL (ref 0.4–1.2)
GFR: 114.03 mL/min (ref >60.00)
Glucose, Bld: 113 mg/dL — ABNORMAL HIGH (ref 70–99)
POTASSIUM: 3.9 meq/L (ref 3.5–5.1)
SODIUM: 140 meq/L (ref 135–145)
TOTAL PROTEIN: 7.6 g/dL (ref 6.0–8.3)
Total Bilirubin: 0.5 mg/dL (ref 0.3–1.2)

## 2013-08-09 LAB — CBC WITH DIFFERENTIAL/PLATELET
BASOS ABS: 0.1 10*3/uL (ref 0.0–0.1)
Basophils Relative: 0.9 % (ref 0.0–3.0)
Eosinophils Absolute: 0.2 10*3/uL (ref 0.0–0.7)
Eosinophils Relative: 2.9 % (ref 0.0–5.0)
HCT: 35.9 % — ABNORMAL LOW (ref 36.0–46.0)
Hemoglobin: 11.6 g/dL — ABNORMAL LOW (ref 12.0–15.0)
LYMPHS ABS: 2.2 10*3/uL (ref 0.7–4.0)
Lymphocytes Relative: 37.9 % (ref 12.0–46.0)
MCHC: 32.5 g/dL (ref 30.0–36.0)
MCV: 78.5 fl (ref 78.0–100.0)
MONOS PCT: 6.5 % (ref 3.0–12.0)
Monocytes Absolute: 0.4 10*3/uL (ref 0.1–1.0)
NEUTROS PCT: 51.8 % (ref 43.0–77.0)
Neutro Abs: 3.1 10*3/uL (ref 1.4–7.7)
PLATELETS: 405 10*3/uL — AB (ref 150.0–400.0)
RBC: 4.57 Mil/uL (ref 3.87–5.11)
RDW: 20.3 % — AB (ref 11.5–14.6)
WBC: 5.9 10*3/uL (ref 4.5–10.5)

## 2013-08-09 LAB — HEMOGLOBIN A1C: HEMOGLOBIN A1C: 6.4 % (ref 4.6–6.5)

## 2013-08-13 ENCOUNTER — Other Ambulatory Visit: Payer: Self-pay | Admitting: Cardiology

## 2013-08-13 ENCOUNTER — Other Ambulatory Visit: Payer: Self-pay | Admitting: Family Medicine

## 2013-08-15 MED ORDER — SIMVASTATIN 40 MG PO TABS: 40.0000 mg | ORAL_TABLET | Freq: Every day | ORAL | Status: DC

## 2013-08-15 MED ORDER — MECLIZINE HCL 25 MG PO TABS: 25.0000 mg | ORAL_TABLET | Freq: Three times a day (TID) | ORAL | Status: DC | PRN

## 2013-08-16 ENCOUNTER — Ambulatory Visit: Payer: BC Managed Care – PPO | Admitting: Family Medicine

## 2013-08-17 ENCOUNTER — Ambulatory Visit (INDEPENDENT_AMBULATORY_CARE_PROVIDER_SITE_OTHER): Payer: BC Managed Care – PPO | Admitting: Family Medicine

## 2013-08-17 ENCOUNTER — Encounter: Payer: Self-pay | Admitting: Family Medicine

## 2013-08-17 VITALS — BP 135/80 | HR 103 | Temp 98.1°F | Ht 60.25 in | Wt 170.0 lb

## 2013-08-17 DIAGNOSIS — I10 Essential (primary) hypertension: Secondary | ICD-10-CM

## 2013-08-17 DIAGNOSIS — E119 Type 2 diabetes mellitus without complications: Secondary | ICD-10-CM

## 2013-08-17 DIAGNOSIS — D649 Anemia, unspecified: Secondary | ICD-10-CM

## 2013-08-17 NOTE — Assessment & Plan Note (Signed)
Much imp after addn of low dose glipizide No hypoglycemia or side eff Good diet  Will work on exercise and wt loss  Due for opthy in a mo  F/u summer

## 2013-08-17 NOTE — Progress Notes (Signed)
Subjective:    Patient ID: Martha Hamilton, female    DOB: 04-08-1951, 63 y.o.   MRN: [id number]  HPI Here for f/u for chronic health problems  Has been feeling good   Wt is up 4 lb with bmi of 32  bp is up a bit at first check - was rushing to get here -also pulse 103  No cp or palpitations or headaches or edema  No side effects to medicines  BP Readings from Last 3 Encounters:  08/17/13 142/72  06/15/13 124/62  06/14/13 138/82     Hx of anemia On iron  Lab Results  Component Value Date   WBC 5.9 08/09/2013   HGB 11.6* 08/09/2013   HCT 35.9* 08/09/2013   MCV 78.5 08/09/2013   PLT 405.0* 08/09/2013   slt improved  Had her colonoscopy - all was ok   Diabetes Home sugar results - has noticed a big improvement -no hypoglycemia at all - sugar stays around 100-130s DM diet - watches it closely  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 Pain in joint, shoulder region 06/14/2013  . Wrist pain, acute 06/14/2013  . Anemia 02/09/2013  . Abnormal EKG 07/27/2012  . Preoperative clearance 07/27/2012  . Post-menopausal bleeding 05/04/2012  . Parotiditis 12/29/2011  . Special screening for malignant neoplasms, colon 04/29/2011  . Gynecological examination 04/29/2011  . Routine general medical examination at a health care facility 04/20/2011  . Right shoulder pain 11/18/2010  . OSTEOARTHRITIS, HANDS, BILATERAL 10/31/2008  . DIABETES MELLITUS, TYPE II 07/10/2008  . HYPERLIPIDEMIA 01/26/2008  . HYPERTENSION, ESSENTIAL NOS 01/26/2008  . ALLERGIC RHINITIS 12/07/2007   Past Medical History  Diagnosis Date  . Allergic rhinitis   . DM2 (diabetes mellitus, type 2)   . Hypertension     . Osteoarthritis     hands  . Obesity   . Breast cancer   . GERD (gastroesophageal reflux disease)   . Hx of cardiovascular stress test     a. Lex MV 2/14:  EF 69%, no ischemia  . Iron deficiency anemia    Past Surgical History  Procedure Laterality Date  . Gastric restriction surgery      for reflux  . Appendectomy  1976  . Breast lumpectomy Right     right breast, lumpectomy w/nodes  . Hysteroscopy w/d&c N/A 10/18/2012    Procedure: DILATATION AND CURETTAGE /HYSTEROSCOPY;  Surgeon: Mora Bellman, MD;  Location: [facility];  Service: Gynecology;  Laterality: N/A;  . Polypectomy N/A 10/18/2012    Procedure: POLYPECTOMY;  Surgeon: Mora Bellman, MD;  Location: [facility];  Service: Gynecology;  Laterality: N/A;   History  Substance Use Topics  . Smoking status: Never Smoker   . Smokeless tobacco: Never Used  . Alcohol Use: No   Family History  Problem Relation Age of Onset  . Diabetes Mother   . Heart disease Mother     Pacemaker, CHF  . Stroke Father   . CAD Father 72    Died with MI  . Esophageal cancer Brother   . Colon cancer Neg Hx   . Rectal cancer Neg Hx   .  Stomach cancer Neg Hx   . Hypertension Mother   . Kidney cancer Mother   . Heart disease Sister   . Diabetes Sister    Allergies  Allergen Reactions  . Cholestyramine Other (See Comments)    REACTION: Muscles tightened up, drew up.  . Lipitor [Atorvastatin] Other (See Comments)    Muscle cramping  . Niacin Nausea And Vomiting   Current Outpatient Prescriptions on File Prior to Visit  Medication Sig Dispense Refill  . ACCU-CHEK COMPACT PLUS test strip USE TO TEST SUGAR ONCE A DAY FOR DIABETES MELLITUS  100 each  0  . ACCU-CHEK SOFTCLIX LANCETS lancets       . acetaminophen (TYLENOL) 500 MG tablet Take 500 mg by mouth every 6 (six) hours as needed for mild pain.       Marland Kitchen amitriptyline (ELAVIL) 25 MG tablet Take 1 tablet (25 mg total) by mouth at bedtime.  90 tablet  3  . aspirin 81 MG tablet Take 81 mg by  mouth daily.      . Cholecalciferol (VITAMIN D) 1000 UNITS capsule Take 2,000 Units by mouth daily.       . diclofenac sodium (VOLTAREN) 1 % GEL Apply 1 application topically as needed. For arthritis in finger      . fenofibrate (TRICOR) 145 MG tablet Take 145 mg by mouth daily.      . ferrous sulfate 325 (65 FE) MG EC tablet Take 1 tablet (325 mg total) by mouth 3 (three) times daily with meals.  90 tablet  6  . glipiZIDE (GLUCOTROL XL) 2.5 MG 24 hr tablet Take 1 tablet (2.5 mg total) by mouth daily. Take in the evening  30 tablet  5  . Glucosamine-MSM-Hyaluronic Acd (JOINT HEALTH) 750-375-30 MG TABS Take 2 tablets by mouth daily.      . hydrochlorothiazide (MICROZIDE) 12.5 MG capsule TAKE 1 CAPSULE DAILY  90 capsule  0  . losartan (COZAAR) 100 MG tablet Take 1 tablet (100 mg total) by mouth daily.  90 tablet  3  . meclizine (ANTIVERT) 25 MG tablet Take 1 tablet (25 mg total) by mouth 3 (three) times daily as needed.  90 tablet  0  . metFORMIN (GLUCOPHAGE) 1000 MG tablet Take 1 tablet (1,000 mg total) by mouth 2 (two) times daily with a meal.  180 tablet  3  . Omega-3 Fatty Acids (FISH OIL) 1200 MG CAPS Take 1 capsule by mouth daily.      Marland Kitchen omeprazole (PRILOSEC) 20 MG capsule TAKE 1 CAPSULE DAILY  90 capsule  2  . OVER THE COUNTER MEDICATION Vitalizer Gold without Vitamin  K taking one group of supplement daily.       Marland Kitchen SALINE NASAL SPRAY NA Place 1 spray into the nose daily.       . simvastatin (ZOCOR) 40 MG tablet Take 1 tablet (40 mg total) by mouth at bedtime.  90 tablet  1   No current facility-administered medications on file prior to visit.    Review of Systems Review of Systems  Constitutional: Negative for fever, appetite change, fatigue and unexpected weight change.  Eyes: Negative for pain and visual disturbance.  Respiratory: Negative for cough and shortness of breath.   Cardiovascular: Negative for cp or palpitations    Gastrointestinal: Negative for nausea, diarrhea and  constipation.  Genitourinary: Negative for urgency and frequency.  Skin: Negative for pallor or rash   Neurological: Negative for weakness, light-headedness, numbness and headaches.  Hematological: Negative for adenopathy. Does  not bruise/bleed easily.  Psychiatric/Behavioral: Negative for dysphoric mood. The patient is not nervous/anxious.         Objective:   Physical Exam  Constitutional: She appears well-developed and well-nourished. No distress.  obese and well appearing   HENT:  Head: Normocephalic and atraumatic.  Right Ear: External ear normal.  Left Ear: External ear normal.  Nose: Nose normal.  Mouth/Throat: Oropharynx is clear and moist.  Eyes: Conjunctivae and EOM are normal. Pupils are equal, round, and reactive to light. Right eye exhibits no discharge. Left eye exhibits no discharge. No scleral icterus.  Neck: Normal range of motion. Neck supple. No JVD present. Carotid bruit is not present. No thyromegaly present.  Cardiovascular: Normal rate, regular rhythm, normal heart sounds and intact distal pulses.  Exam reveals no gallop.   Pulmonary/Chest: Effort normal and breath sounds normal. No respiratory distress. She has no wheezes. She has no rales.  Abdominal: Soft. Bowel sounds are normal. She exhibits no distension and no abdominal bruit.  Musculoskeletal: She exhibits no edema and no tenderness.  Lymphadenopathy:    She has no cervical adenopathy.  Neurological: She is alert. She has normal reflexes. No cranial nerve deficit. She exhibits normal muscle tone. Coordination normal.  Skin: Skin is warm and dry. No rash noted. No erythema. No pallor.  Psychiatric: She has a normal mood and affect.          Assessment & Plan:

## 2013-08-17 NOTE — Assessment & Plan Note (Signed)
This is mild and stable to slt imp  On iron  S/p colonoscopy Will continue to follow

## 2013-08-17 NOTE — Progress Notes (Signed)
Pre-visit discussion using our clinic review tool. No additional management support is needed unless otherwise documented below in the visit note.  

## 2013-08-17 NOTE — Assessment & Plan Note (Signed)
Up a bit today- stressors and hurrying  BP: 135/80 mmHg  bp in fair control at this time  No changes needed Disc lifstyle change with low sodium diet and exercise   F/u summer Labs reviewed

## 2013-08-17 NOTE — Patient Instructions (Addendum)
Keep exercising with the Wii , or walking - for weight loss and sugar control  Diabetes is better - keep up the good work  No change in medicines  Follow up after Aug first for annual exam with labs prior

## 2013-08-19 ENCOUNTER — Telehealth: Payer: Self-pay | Admitting: Family Medicine

## 2013-08-19 ENCOUNTER — Telehealth: Payer: Self-pay

## 2013-08-19 NOTE — Telephone Encounter (Signed)
Relevant patient education assigned to patient using Emmi. ° °

## 2013-09-03 ENCOUNTER — Other Ambulatory Visit: Payer: Self-pay | Admitting: Family Medicine

## 2013-09-05 NOTE — Telephone Encounter (Signed)
done

## 2013-09-05 NOTE — Telephone Encounter (Signed)
Electronic refill request, please advise  

## 2013-09-05 NOTE — Telephone Encounter (Signed)
Please refill for a year  

## 2013-09-06 ENCOUNTER — Other Ambulatory Visit: Payer: Self-pay | Admitting: Family Medicine

## 2013-09-06 ENCOUNTER — Other Ambulatory Visit: Payer: Self-pay | Admitting: Cardiology

## 2013-09-06 MED ORDER — ACCU-CHEK SOFTCLIX LANCETS MISC: Status: DC

## 2013-09-06 MED ORDER — GLUCOSE BLOOD VI STRP: ORAL_STRIP | Status: DC

## 2013-09-12 NOTE — Telephone Encounter (Signed)
See Previous Encounter

## 2013-09-21 ENCOUNTER — Other Ambulatory Visit: Payer: Self-pay

## 2013-09-21 ENCOUNTER — Other Ambulatory Visit (INDEPENDENT_AMBULATORY_CARE_PROVIDER_SITE_OTHER): Payer: BC Managed Care – PPO

## 2013-09-21 DIAGNOSIS — D649 Anemia, unspecified: Secondary | ICD-10-CM

## 2013-09-21 LAB — CBC WITH DIFFERENTIAL/PLATELET
BASOS PCT: 0.4 % (ref 0.0–3.0)
Basophils Absolute: 0 10*3/uL (ref 0.0–0.1)
EOS PCT: 2.9 % (ref 0.0–5.0)
Eosinophils Absolute: 0.2 10*3/uL (ref 0.0–0.7)
HEMATOCRIT: 35.6 % — AB (ref 36.0–46.0)
Hemoglobin: 11.8 g/dL — ABNORMAL LOW (ref 12.0–15.0)
Lymphocytes Relative: 36.6 % (ref 12.0–46.0)
Lymphs Abs: 2.3 10*3/uL (ref 0.7–4.0)
MCHC: 33.2 g/dL (ref 30.0–36.0)
MCV: 81.8 fl (ref 78.0–100.0)
Monocytes Absolute: 0.5 10*3/uL (ref 0.1–1.0)
Monocytes Relative: 7.9 % (ref 3.0–12.0)
NEUTROS PCT: 52.2 % (ref 43.0–77.0)
Neutro Abs: 3.3 10*3/uL (ref 1.4–7.7)
Platelets: 400 10*3/uL (ref 150.0–400.0)
RBC: 4.36 Mil/uL (ref 3.87–5.11)
RDW: 17.3 % — ABNORMAL HIGH (ref 11.5–14.6)
WBC: 6.3 10*3/uL (ref 4.5–10.5)

## 2013-09-22 ENCOUNTER — Other Ambulatory Visit: Payer: Self-pay | Admitting: *Deleted

## 2013-09-22 MED ORDER — HYDROCHLOROTHIAZIDE 12.5 MG PO CAPS: ORAL_CAPSULE | ORAL | Status: DC

## 2013-09-28 ENCOUNTER — Other Ambulatory Visit: Payer: Self-pay | Admitting: Family Medicine

## 2013-10-20 ENCOUNTER — Other Ambulatory Visit: Payer: Self-pay | Admitting: Family Medicine

## 2013-10-24 ENCOUNTER — Ambulatory Visit (INDEPENDENT_AMBULATORY_CARE_PROVIDER_SITE_OTHER): Payer: BC Managed Care – PPO | Admitting: Cardiology

## 2013-10-24 ENCOUNTER — Encounter: Payer: Self-pay | Admitting: Cardiology

## 2013-10-24 VITALS — BP 146/90 | HR 112 | Ht 60.25 in | Wt 171.1 lb

## 2013-10-24 DIAGNOSIS — R9431 Abnormal electrocardiogram [ECG] [EKG]: Secondary | ICD-10-CM

## 2013-10-24 DIAGNOSIS — I10 Essential (primary) hypertension: Secondary | ICD-10-CM

## 2013-10-24 MED ORDER — HYDROCHLOROTHIAZIDE 12.5 MG PO CAPS: ORAL_CAPSULE | ORAL | Status: DC

## 2013-10-24 MED ORDER — LOSARTAN POTASSIUM 100 MG PO TABS: ORAL_TABLET | ORAL | Status: DC

## 2013-10-24 NOTE — Patient Instructions (Signed)
The current medical regimen is effective;  continue present plan and medications.  Follow up in 18 months with Dr Percival Spanish.  You will receive a letter in the mail 2 months before you are due.  Please call us when you receive this letter to schedule your follow up appointment.

## 2013-10-24 NOTE — Progress Notes (Signed)
HPI I saw the patient presented last year for preoperative evaluation prior to a D&C. She has not had any prior cardiac history but she does have risk factors. She had a [employer].  This demonstrated no ischemia or infarct with a normal EF.  Since I last saw her she has done well.  The patient denies any new symptoms such as chest discomfort, neck or arm discomfort. There has been no new shortness of breath, PND or orthopnea. There have been no reported palpitations, presyncope or syncope.  Allergies  Allergen Reactions  . Cholestyramine Other (See Comments)    REACTION: Muscles tightened up, drew up.  . Lipitor [Atorvastatin] Other (See Comments)    Muscle cramping  . Niacin Nausea And Vomiting    Current Outpatient Prescriptions  Medication Sig Dispense Refill  . ACCU-CHEK SOFTCLIX LANCETS lancets Use to check blood sugars once daily  100 each  3  . acetaminophen (TYLENOL) 500 MG tablet Take 500 mg by mouth every 6 (six) hours as needed for mild pain.       Marland Kitchen amitriptyline (ELAVIL) 25 MG tablet TAKE 1 TABLET (25 MG TOTAL) AT BEDTIME  90 tablet  3  . aspirin 81 MG tablet Take 81 mg by mouth daily.      . Cholecalciferol (VITAMIN D) 1000 UNITS capsule Take 2,000 Units by mouth daily.       . diclofenac sodium (VOLTAREN) 1 % GEL Apply 1 application topically as needed. For arthritis in finger      . fenofibrate (TRICOR) 145 MG tablet TAKE 1 TABLET DAILY  90 tablet  1  . ferrous sulfate 325 (65 FE) MG EC tablet Take 1 tablet (325 mg total) by mouth 3 (three) times daily with meals.  90 tablet  6  . glipiZIDE (GLUCOTROL XL) 2.5 MG 24 hr tablet Take 1 tablet (2.5 mg total) by mouth daily. Take in the evening  30 tablet  5  . Glucosamine-MSM-Hyaluronic Acd (JOINT HEALTH) 750-375-30 MG TABS Take 2 tablets by mouth daily.      Marland Kitchen glucose blood (ACCU-CHEK COMPACT PLUS) test strip USE TO TEST SUGAR ONCE A DAY FOR DIABETES MELLITUS  100 each  3  . hydrochlorothiazide (MICROZIDE) 12.5 MG  capsule TAKE 1 CAPSULE DAILY  90 capsule  0  . losartan (COZAAR) 100 MG tablet TAKE 1 TABLET DAILY  90 tablet  1  . meclizine (ANTIVERT) 25 MG tablet Take 1 tablet (25 mg total) by mouth 3 (three) times daily as needed.  90 tablet  0  . metFORMIN (GLUCOPHAGE) 1000 MG tablet Take 1 tablet (1,000 mg total) by mouth 2 (two) times daily with a meal.  180 tablet  3  . Omega-3 Fatty Acids (FISH OIL) 1200 MG CAPS Take 1 capsule by mouth daily.      Marland Kitchen omeprazole (PRILOSEC) 20 MG capsule TAKE 1 CAPSULE DAILY  90 capsule  2  . OVER THE COUNTER MEDICATION Vitalizer Gold without Vitamin  K taking one group of supplement daily.       Marland Kitchen SALINE NASAL SPRAY NA Place 1 spray into the nose daily.       . simvastatin (ZOCOR) 40 MG tablet Take 1 tablet (40 mg total) by mouth at bedtime.  90 tablet  1   No current facility-administered medications for this visit.    Past Medical History  Diagnosis Date  . Allergic rhinitis   . DM2 (diabetes mellitus, type 2)   . Hypertension   .  Osteoarthritis     hands  . Obesity   . Breast cancer   . GERD (gastroesophageal reflux disease)   . Hx of cardiovascular stress test     a. Lex MV 2/14:  EF 69%, no ischemia  . Iron deficiency anemia     Past Surgical History  Procedure Laterality Date  . Gastric restriction surgery      for reflux  . Appendectomy  1976  . Breast lumpectomy Right     right breast, lumpectomy w/nodes  . Hysteroscopy w/d&c N/A 10/18/2012    Procedure: DILATATION AND CURETTAGE /HYSTEROSCOPY;  Surgeon: Mora Bellman, MD;  Location: [facility];  Service: Gynecology;  Laterality: N/A;  . Polypectomy N/A 10/18/2012    Procedure: POLYPECTOMY;  Surgeon: Mora Bellman, MD;  Location: [facility];  Service: Gynecology;  Laterality: N/A;   ROS:  As stated in the HPI and negative for all other systems.  PHYSICAL EXAM BP 146/90  Pulse 112  Ht 5' 0.25" (1.53 m)  Wt 171 lb 1.9 oz (77.62 kg)  BMI 33.16 kg/m2  LMP 06/13/2001 GENERAL:  Well appearing NECK:   No jugular venous distention, waveform within normal limits, carotid upstroke brisk and symmetric, no bruits, no thyromegaly LUNGS:  Clear to auscultation bilaterally BACK:  No CVA tenderness CHEST:  Unremarkable HEART:  PMI not displaced or sustained,S1 and S2 within normal limits, no S3, no S4, no clicks, no rubs, no murmurs ABD:  Flat, positive bowel sounds normal in frequency in pitch, no bruits, no rebound, no guarding, no midline pulsatile mass, no hepatomegaly, no splenomegaly EXT:  2 plus pulses throughout, no edema, no cyanosis no clubbing  EKG:  Sinus rhythm, rate 112, axis within normal limits, intervals within normal limits, RSR prime V1 and V2, no acute ST-T wave changes. 10/24/2013  ASSESSMENT AND PLAN  Hypertension Her blood pressure is not at target for diabetes. I will add HCTZ 12.5 mg daily.  Hypertriglyceridemia She will continue with diabetes control and we talked briefly about diet.

## 2013-11-03 ENCOUNTER — Other Ambulatory Visit: Payer: Self-pay | Admitting: Family Medicine

## 2013-12-20 ENCOUNTER — Telehealth: Payer: Self-pay

## 2013-12-20 ENCOUNTER — Other Ambulatory Visit: Payer: Self-pay | Admitting: Family Medicine

## 2013-12-20 NOTE — Telephone Encounter (Signed)
Message copied by Algernon Huxley on Tue Dec 20, 2013 10:23 AM ------      Message from: HUNT, LINDA R      Created: Wed Sep 21, 2013 11:32 AM      Regarding: CBC       Pt needs CBC in 3 mth, order in ------

## 2013-12-20 NOTE — Telephone Encounter (Signed)
Pt aware.

## 2013-12-21 ENCOUNTER — Other Ambulatory Visit (INDEPENDENT_AMBULATORY_CARE_PROVIDER_SITE_OTHER): Payer: BC Managed Care – PPO

## 2013-12-21 ENCOUNTER — Other Ambulatory Visit: Payer: Self-pay

## 2013-12-21 DIAGNOSIS — D649 Anemia, unspecified: Secondary | ICD-10-CM

## 2013-12-21 LAB — CBC WITH DIFFERENTIAL/PLATELET
BASOS ABS: 0 10*3/uL (ref 0.0–0.1)
Basophils Relative: 0.4 % (ref 0.0–3.0)
EOS ABS: 0.2 10*3/uL (ref 0.0–0.7)
Eosinophils Relative: 3.5 % (ref 0.0–5.0)
HEMATOCRIT: 36.9 % (ref 36.0–46.0)
Hemoglobin: 12.4 g/dL (ref 12.0–15.0)
LYMPHS ABS: 2.3 10*3/uL (ref 0.7–4.0)
Lymphocytes Relative: 36.7 % (ref 12.0–46.0)
MCHC: 33.4 g/dL (ref 30.0–36.0)
MCV: 87.3 fl (ref 78.0–100.0)
Monocytes Absolute: 0.4 10*3/uL (ref 0.1–1.0)
Monocytes Relative: 6.8 % (ref 3.0–12.0)
NEUTROS ABS: 3.2 10*3/uL (ref 1.4–7.7)
Neutrophils Relative %: 52.6 % (ref 43.0–77.0)
PLATELETS: 377 10*3/uL (ref 150.0–400.0)
RBC: 4.23 Mil/uL (ref 3.87–5.11)
RDW: 13.6 % (ref 11.5–15.5)
WBC: 6.2 10*3/uL (ref 4.0–10.5)

## 2014-01-26 ENCOUNTER — Telehealth: Payer: Self-pay | Admitting: Family Medicine

## 2014-01-26 NOTE — Telephone Encounter (Signed)
No-that was an isolated bp -it has been fine prior and with her specialists thanks

## 2014-01-26 NOTE — Telephone Encounter (Signed)
Diabetic Bundle.  Last BP was 146/90 on 10/24/13.  Do you want me to schedule follow up to recheck BP?

## 2014-02-12 ENCOUNTER — Telehealth: Payer: Self-pay | Admitting: Family Medicine

## 2014-02-12 DIAGNOSIS — E119 Type 2 diabetes mellitus without complications: Secondary | ICD-10-CM

## 2014-02-12 DIAGNOSIS — Z0000 Encounter for general adult medical examination without abnormal findings: Secondary | ICD-10-CM

## 2014-02-12 NOTE — Telephone Encounter (Signed)
Message copied by Abner Greenspan on Sun Feb 12, 2014 10:28 AM ------      Message from: Ellamae Sia      Created: Tue Feb 07, 2014 12:13 PM      Regarding: Lab orders for Monday,8.3.15        Patient is scheduled for CPX labs, please order future labs, Thanks , Terri       ------

## 2014-02-13 ENCOUNTER — Other Ambulatory Visit (INDEPENDENT_AMBULATORY_CARE_PROVIDER_SITE_OTHER): Payer: BC Managed Care – PPO

## 2014-02-13 DIAGNOSIS — Z0000 Encounter for general adult medical examination without abnormal findings: Secondary | ICD-10-CM

## 2014-02-13 DIAGNOSIS — E119 Type 2 diabetes mellitus without complications: Secondary | ICD-10-CM

## 2014-02-13 LAB — HEMOGLOBIN A1C: HEMOGLOBIN A1C: 6.6 % — AB (ref 4.6–6.5)

## 2014-02-13 LAB — CBC WITH DIFFERENTIAL/PLATELET
BASOS ABS: 0 10*3/uL (ref 0.0–0.1)
Basophils Relative: 0.4 % (ref 0.0–3.0)
EOS PCT: 3.4 % (ref 0.0–5.0)
Eosinophils Absolute: 0.2 10*3/uL (ref 0.0–0.7)
HCT: 38.7 % (ref 36.0–46.0)
Hemoglobin: 12.9 g/dL (ref 12.0–15.0)
LYMPHS PCT: 35.3 % (ref 12.0–46.0)
Lymphs Abs: 2.3 10*3/uL (ref 0.7–4.0)
MCHC: 33.4 g/dL (ref 30.0–36.0)
MCV: 88.5 fl (ref 78.0–100.0)
MONOS PCT: 7.2 % (ref 3.0–12.0)
Monocytes Absolute: 0.5 10*3/uL (ref 0.1–1.0)
NEUTROS PCT: 53.7 % (ref 43.0–77.0)
Neutro Abs: 3.5 10*3/uL (ref 1.4–7.7)
PLATELETS: 375 10*3/uL (ref 150.0–400.0)
RBC: 4.37 Mil/uL (ref 3.87–5.11)
RDW: 13.3 % (ref 11.5–15.5)
WBC: 6.6 10*3/uL (ref 4.0–10.5)

## 2014-02-13 LAB — LIPID PANEL
Cholesterol: 126 mg/dL (ref 0–200)
HDL: 34.7 mg/dL — AB (ref >39.00)
NONHDL: 91.3
Total CHOL/HDL Ratio: 4
Triglycerides: 301 mg/dL — ABNORMAL HIGH (ref 0.0–149.0)
VLDL: 60.2 mg/dL — AB (ref 0.0–40.0)

## 2014-02-13 LAB — COMPREHENSIVE METABOLIC PANEL
ALBUMIN: 3.7 g/dL (ref 3.5–5.2)
ALT: 25 U/L (ref 0–35)
AST: 25 U/L (ref 0–37)
Alkaline Phosphatase: 28 U/L — ABNORMAL LOW (ref 39–117)
BILIRUBIN TOTAL: 0.3 mg/dL (ref 0.2–1.2)
BUN: 14 mg/dL (ref 6–23)
CO2: 27 meq/L (ref 19–32)
Calcium: 9.5 mg/dL (ref 8.4–10.5)
Chloride: 100 mEq/L (ref 96–112)
Creatinine, Ser: 0.6 mg/dL (ref 0.4–1.2)
GFR: 116.19 mL/min (ref >60.00)
GLUCOSE: 130 mg/dL — AB (ref 70–99)
POTASSIUM: 3.7 meq/L (ref 3.5–5.1)
SODIUM: 136 meq/L (ref 135–145)
Total Protein: 7.2 g/dL (ref 6.0–8.3)

## 2014-02-13 LAB — LDL CHOLESTEROL, DIRECT: Direct LDL: 65.8 mg/dL

## 2014-02-13 LAB — TSH: TSH: 1.58 u[IU]/mL (ref 0.35–4.50)

## 2014-02-20 ENCOUNTER — Encounter: Payer: BC Managed Care – PPO | Admitting: Family Medicine

## 2014-02-23 ENCOUNTER — Other Ambulatory Visit: Payer: Self-pay

## 2014-02-23 DIAGNOSIS — Z1231 Encounter for screening mammogram for malignant neoplasm of breast: Secondary | ICD-10-CM

## 2014-03-01 ENCOUNTER — Encounter: Payer: Self-pay | Admitting: Family Medicine

## 2014-03-01 ENCOUNTER — Ambulatory Visit (INDEPENDENT_AMBULATORY_CARE_PROVIDER_SITE_OTHER): Payer: BC Managed Care – PPO | Admitting: Family Medicine

## 2014-03-01 VITALS — BP 146/74 | HR 110 | Temp 98.9°F | Ht 59.5 in | Wt 171.8 lb

## 2014-03-01 DIAGNOSIS — I10 Essential (primary) hypertension: Secondary | ICD-10-CM

## 2014-03-01 DIAGNOSIS — E785 Hyperlipidemia, unspecified: Secondary | ICD-10-CM

## 2014-03-01 DIAGNOSIS — E119 Type 2 diabetes mellitus without complications: Secondary | ICD-10-CM

## 2014-03-01 DIAGNOSIS — Z0000 Encounter for general adult medical examination without abnormal findings: Secondary | ICD-10-CM

## 2014-03-01 MED ORDER — FENOFIBRATE 145 MG PO TABS: ORAL_TABLET | ORAL | Status: DC

## 2014-03-01 MED ORDER — GLIPIZIDE ER 2.5 MG PO TB24: ORAL_TABLET | ORAL | Status: DC

## 2014-03-01 MED ORDER — OMEPRAZOLE 20 MG PO CPDR: DELAYED_RELEASE_CAPSULE | ORAL | Status: DC

## 2014-03-01 MED ORDER — METFORMIN HCL 1000 MG PO TABS: ORAL_TABLET | ORAL | Status: DC

## 2014-03-01 NOTE — Progress Notes (Signed)
Subjective:    Patient ID: Martha Hamilton, female    DOB: 1951-01-15, 63 y.o.   MRN: [id number]  HPI Here for health maintenance exam and to review chronic medical problems    She is doing pretty well overall   She bought a stepper for exercise at home - 1000 steps at a time  She started to have a lot of leg pain/ muscle tightness Then noticed also arm muscle pain  Then noticed a ST on her R side   Wondered about thyroid  Some trouble swallowing pills Also noticed some ear discomfort on L -like it wants to close up  Lab Results  Component Value Date   TSH 1.58 02/13/2014     Trying really hard to loose wt  Eating less  Mostly soft food also with throat issue  And exercise   Mammogram 9/14 nl  Made appt for her next one also  Will get 3 D mammogram   Wt is stable with bmi of 34  Flu vaccine last fall -gets that yearly   colonosc 10/14  Pap 3/14 - was fine  Had a D and C last April 2014- doing great since then    Td 8=7/10 Pneumovax 11/14 Zoster vaccine 7/12  Mood - fairly good/tries to keep a good attitude , gets tearful at times taking full time care of her mother  Overall stays very motivated  Frustrated - at times /mother has poor memory   bp is stable today  No cp or palpitations or headaches or edema  No side effects to medicines  BP Readings from Last 3 Encounters:  03/01/14 146/74  10/24/13 146/90  08/17/13 135/80    Re check 140/70  At home it tends to be better   Dm Lab Results  Component Value Date   HGBA1C 6.6* 02/13/2014   this is up from 6.4 opthy-up to date   Hyperlipidemia Lab Results  Component Value Date   CHOL 126 02/13/2014   CHOL 131 05/10/2013   CHOL 134 02/02/2013   Lab Results  Component Value Date   HDL 34.70* 02/13/2014   HDL 41.10 05/10/2013   HDL 35.50* 02/02/2013   Lab Results  Component Value Date   LDLCALC 51 10/21/2011   LDLCALC 48 04/29/2010   Lab Results  Component Value Date   TRIG 301.0* 02/13/2014   TRIG 292.0*  05/10/2013   TRIG 292.0* 02/02/2013   Lab Results  Component Value Date   CHOLHDL 4 02/13/2014   CHOLHDL 3 05/10/2013   CHOLHDL 4 02/02/2013   Lab Results  Component Value Date   LDLDIRECT 65.8 02/13/2014   LDLDIRECT 62.5 05/10/2013   LDLDIRECT 65.3 02/02/2013   eats a healthy diet  Also takes fish oil and exercises to work on HDL  Patient Active Problem List   Diagnosis Date Noted  . Pain in joint, shoulder region 06/14/2013  . Wrist pain, acute 06/14/2013  . Anemia 02/09/2013  . Abnormal EKG 07/27/2012  . Preoperative clearance 07/27/2012  . Special screening for malignant neoplasms, colon 04/29/2011  . Gynecological examination 04/29/2011  . Routine general medical examination at a health care facility 04/20/2011  . Right shoulder pain 11/18/2010  . OSTEOARTHRITIS, HANDS, BILATERAL 10/31/2008  . DIABETES MELLITUS, TYPE II 07/10/2008  . HYPERLIPIDEMIA 01/26/2008  . HYPERTENSION, ESSENTIAL NOS 01/26/2008  . ALLERGIC RHINITIS 12/07/2007   Past Medical History  Diagnosis Date  . Allergic rhinitis   . DM2 (diabetes mellitus, type 2)   . Hypertension   .  Osteoarthritis     hands  . Obesity   . Breast cancer   . GERD (gastroesophageal reflux disease)   . Hx of cardiovascular stress test     a. Lex MV 2/14:  EF 69%, no ischemia  . Iron deficiency anemia    Past Surgical History  Procedure Laterality Date  . Gastric restriction surgery      for reflux  . Appendectomy  1976  . Breast lumpectomy Right     right breast, lumpectomy w/nodes  . Hysteroscopy w/d&c N/A 10/18/2012    Procedure: DILATATION AND CURETTAGE /HYSTEROSCOPY;  Surgeon: Mora Bellman, MD;  Location: [facility];  Service: Gynecology;  Laterality: N/A;  . Polypectomy N/A 10/18/2012    Procedure: POLYPECTOMY;  Surgeon: Mora Bellman, MD;  Location: [facility];  Service: Gynecology;  Laterality: N/A;   History  Substance Use Topics  . Smoking status: Never Smoker   . Smokeless tobacco: Never Used  . Alcohol Use: No    Family History  Problem Relation Age of Onset  . Diabetes Mother   . Heart disease Mother     Pacemaker, CHF  . Stroke Father   . CAD Father 61    Died with MI  . Esophageal cancer Brother   . Colon cancer Neg Hx   . Rectal cancer Neg Hx   . Stomach cancer Neg Hx   . Hypertension Mother   . Kidney cancer Mother   . Heart disease Sister   . Diabetes Sister    Allergies  Allergen Reactions  . Cholestyramine Other (See Comments)    REACTION: Muscles tightened up, drew up.  . Lipitor [Atorvastatin] Other (See Comments)    Muscle cramping  . Niacin Nausea And Vomiting   Current Outpatient Prescriptions on File Prior to Visit  Medication Sig Dispense Refill  . ACCU-CHEK SOFTCLIX LANCETS lancets Use to check blood sugars once daily  100 each  3  . acetaminophen (TYLENOL) 500 MG tablet Take 500 mg by mouth every 6 (six) hours as needed for mild pain.       Marland Kitchen amitriptyline (ELAVIL) 25 MG tablet TAKE 1 TABLET (25 MG TOTAL) AT BEDTIME  90 tablet  3  . aspirin 81 MG tablet Take 81 mg by mouth daily.      . Cholecalciferol (VITAMIN D) 1000 UNITS capsule Take 2,000 Units by mouth daily.       . diclofenac sodium (VOLTAREN) 1 % GEL Apply 1 application topically as needed. For arthritis in finger      . fenofibrate (TRICOR) 145 MG tablet TAKE 1 TABLET DAILY  90 tablet  1  . ferrous sulfate 325 (65 FE) MG EC tablet Take 1 tablet (325 mg total) by mouth 3 (three) times daily with meals.  90 tablet  6  . GLIPIZIDE XL 2.5 MG 24 hr tablet take 1 tablet by mouth once daily IN THE EVENING  30 tablet  5  . Glucosamine-MSM-Hyaluronic Acd (JOINT HEALTH) 750-375-30 MG TABS Take 2 tablets by mouth daily.      Marland Kitchen glucose blood (ACCU-CHEK COMPACT PLUS) test strip USE TO TEST SUGAR ONCE A DAY FOR DIABETES MELLITUS  100 each  3  . hydrochlorothiazide (MICROZIDE) 12.5 MG capsule TAKE 1 CAPSULE DAILY  90 capsule  3  . losartan (COZAAR) 100 MG tablet TAKE 1 TABLET DAILY  90 tablet  3  . meclizine (ANTIVERT)  25 MG tablet Take 1 tablet (25 mg total) by mouth 3 (three) times daily as needed.  90 tablet  0  . metFORMIN (GLUCOPHAGE) 1000 MG tablet TAKE 1 TABLET TWICE A DAY WITH A MEAL  180 tablet  0  . Omega-3 Fatty Acids (FISH OIL) 1200 MG CAPS Take 1 capsule by mouth daily.      Marland Kitchen omeprazole (PRILOSEC) 20 MG capsule TAKE 1 CAPSULE DAILY  90 capsule  2  . OVER THE COUNTER MEDICATION Vitalizer Gold without Vitamin  K taking one group of supplement daily.       Marland Kitchen SALINE NASAL SPRAY NA Place 1 spray into the nose daily.       . simvastatin (ZOCOR) 40 MG tablet Take 1 tablet (40 mg total) by mouth at bedtime.  90 tablet  1   No current facility-administered medications on file prior to visit.     Review of Systems    Review of Systems  Constitutional: Negative for fever, appetite change, fatigue and unexpected weight change.  Eyes: Negative for pain and visual disturbance.  Respiratory: Negative for cough and shortness of breath.   Cardiovascular: Negative for cp or palpitations    Gastrointestinal: Negative for nausea, diarrhea and constipation.  Genitourinary: Negative for urgency and frequency.  Skin: Negative for pallor or rash   Neurological: Negative for weakness, light-headedness, numbness and headaches.  Hematological: Negative for adenopathy. Does not bruise/bleed easily.  Psychiatric/Behavioral: Negative for dysphoric mood. The patient is not nervous/anxious.      Objective:   Physical Exam  Nursing note and vitals reviewed. Constitutional: She appears well-developed and well-nourished. No distress.  obese and well appearing   HENT:  Head: Normocephalic and atraumatic.  Right Ear: External ear normal.  Left Ear: External ear normal.  Nose: Nose normal.  Mouth/Throat: Oropharynx is clear and moist.  Eyes: Conjunctivae and EOM are normal. Pupils are equal, round, and reactive to light. No scleral icterus.  Neck: Normal range of motion. Neck supple. No JVD present. Carotid bruit is  not present. No thyromegaly present.  Cardiovascular: Normal rate, regular rhythm, normal heart sounds and intact distal pulses.  Exam reveals no gallop.   Pulmonary/Chest: Effort normal and breath sounds normal. No respiratory distress. She has no wheezes. She exhibits no tenderness.  Abdominal: Soft. Bowel sounds are normal. She exhibits no distension, no abdominal bruit and no mass. There is no tenderness.  Genitourinary: No breast swelling, tenderness, discharge or bleeding.  Baseline scarring on R breast  Breast exam: No mass, nodules, thickening, tenderness, bulging, retraction, inflamation, nipple discharge or skin changes noted.  No axillary or clavicular LA.      Musculoskeletal: Normal range of motion. She exhibits no edema and no tenderness.  Lymphadenopathy:    She has no cervical adenopathy.  Neurological: She is alert. She has normal reflexes. No cranial nerve deficit. She exhibits normal muscle tone. Coordination normal.  Skin: Skin is warm and dry. No rash noted. No erythema. No pallor.  Psychiatric: She has a normal mood and affect.          Assessment & Plan:   Problem List Items Addressed This Visit     Cardiovascular and Mediastinum   HYPERTENSION, ESSENTIAL NOS      bp in fair control at this time  Her bp do run lower at home than here  BP Readings from Last 1 Encounters:  03/01/14 146/74   No changes needed Disc lifstyle change with low sodium diet and exercise  Labs reviewed      Relevant Medications      fenofibrate (TRICOR) tablet  Endocrine   DIABETES MELLITUS, TYPE II      Lab Results  Component Value Date   HGBA1C 6.6* 02/13/2014    Overall stable utd opthy No change in foot exam F/u in 6 mo    Relevant Medications      metFORMIN (GLUCOPHAGE) tablet      glipiZIDE (GLUCOTROL XL) 24 hr tablet     Other   HYPERLIPIDEMIA     Disc goals for lipids and reasons to control them Rev labs with pt Rev low sat fat diet in detail On zocor  and tricor and diet  In light of recent muscle pain -will hold zocor for 2 weeks to see if this is the cause    Relevant Medications      fenofibrate (TRICOR) tablet   Routine general medical examination at a health care facility - Primary     Reviewed health habits including diet and exercise and skin cancer prevention Reviewed appropriate screening tests for age  Also reviewed health mt list, fam hx and immunization status , as well as social and family history   See HPI Pt has mammogram scheduled  She will f/u with gyn   Labs reviewed

## 2014-03-01 NOTE — Patient Instructions (Signed)
Hold your zocor (simvastatin) for 2 weeks to see if muscle soreness improves and then call and let me know  Check in with your gyn doctor to see when they want to see you  Get your mammogram as planned  Continue fish oil and get back to exercise when you can to help HDL (good cholesterol)  Follow up in 6 months with labs prior

## 2014-03-01 NOTE — Progress Notes (Signed)
Pre visit review using our clinic review tool, if applicable. No additional management support is needed unless otherwise documented below in the visit note. 

## 2014-03-02 NOTE — Assessment & Plan Note (Signed)
Reviewed health habits including diet and exercise and skin cancer prevention Reviewed appropriate screening tests for age  Also reviewed health mt list, fam hx and immunization status , as well as social and family history   See HPI Pt has mammogram scheduled  She will f/u with gyn   Labs reviewed

## 2014-03-02 NOTE — Assessment & Plan Note (Signed)
Disc goals for lipids and reasons to control them Rev labs with pt Rev low sat fat diet in detail On zocor and tricor and diet  In light of recent muscle pain -will hold zocor for 2 weeks to see if this is the cause

## 2014-03-02 NOTE — Assessment & Plan Note (Signed)
Lab Results  Component Value Date   HGBA1C 6.6* 02/13/2014    Overall stable utd opthy No change in foot exam F/u in 6 mo

## 2014-03-02 NOTE — Assessment & Plan Note (Signed)
bp in fair control at this time  Her bp do run lower at home than here  BP Readings from Last 1 Encounters:  03/01/14 146/74   No changes needed Disc lifstyle change with low sodium diet and exercise  Labs reviewed

## 2014-03-16 ENCOUNTER — Telehealth: Payer: Self-pay

## 2014-03-16 NOTE — Telephone Encounter (Signed)
Pt has been holding simvastatin since seen 03/01/14 and muscular pain is much better; pain almost gone. Please advise. [place] if need short term med and [facility] for 3 month rx.

## 2014-03-16 NOTE — Telephone Encounter (Signed)
Just have her stay off of it - add to intolerance list and remove from med list We will see what labs look like at her next app Watch diet carefully

## 2014-03-16 NOTE — Telephone Encounter (Signed)
Pt notified of Dr. Marliss Coots comments/recommendations. Med removed from med list and added to intolerance list

## 2014-03-29 ENCOUNTER — Ambulatory Visit (INDEPENDENT_AMBULATORY_CARE_PROVIDER_SITE_OTHER): Payer: BC Managed Care – PPO

## 2014-03-29 DIAGNOSIS — Z23 Encounter for immunization: Secondary | ICD-10-CM

## 2014-03-30 ENCOUNTER — Ambulatory Visit
Admission: RE | Admit: 2014-03-30 | Discharge: 2014-03-30 | Disposition: A | Discharge status: Home / Self Care | Payer: BC Managed Care – PPO | Source: Ambulatory Visit

## 2014-03-30 DIAGNOSIS — Z1231 Encounter for screening mammogram for malignant neoplasm of breast: Secondary | ICD-10-CM

## 2014-06-19 ENCOUNTER — Telehealth: Payer: Self-pay

## 2014-06-19 NOTE — Telephone Encounter (Signed)
Pt aware.

## 2014-06-19 NOTE — Telephone Encounter (Signed)
-----   Message from Maury Dus, RN sent at 12/21/2013 10:40 AM EDT ----- Regarding: CBC Pt needs CBC, order in epic.

## 2014-06-20 ENCOUNTER — Other Ambulatory Visit: Payer: Self-pay

## 2014-06-20 ENCOUNTER — Other Ambulatory Visit (INDEPENDENT_AMBULATORY_CARE_PROVIDER_SITE_OTHER): Payer: BC Managed Care – PPO

## 2014-06-20 DIAGNOSIS — D649 Anemia, unspecified: Secondary | ICD-10-CM

## 2014-06-20 DIAGNOSIS — D508 Other iron deficiency anemias: Secondary | ICD-10-CM

## 2014-06-20 LAB — CBC WITH DIFFERENTIAL/PLATELET
BASOS PCT: 1.5 % (ref 0.0–3.0)
Basophils Absolute: 0.1 10*3/uL (ref 0.0–0.1)
EOS PCT: 4 % (ref 0.0–5.0)
Eosinophils Absolute: 0.2 10*3/uL (ref 0.0–0.7)
HEMATOCRIT: 37.5 % (ref 36.0–46.0)
HEMOGLOBIN: 12.4 g/dL (ref 12.0–15.0)
LYMPHS ABS: 2.2 10*3/uL (ref 0.7–4.0)
Lymphocytes Relative: 36.4 % (ref 12.0–46.0)
MCHC: 33 g/dL (ref 30.0–36.0)
MCV: 87.8 fl (ref 78.0–100.0)
MONO ABS: 0.5 10*3/uL (ref 0.1–1.0)
Monocytes Relative: 8 % (ref 3.0–12.0)
NEUTROS ABS: 3 10*3/uL (ref 1.4–7.7)
NEUTROS PCT: 50.1 % (ref 43.0–77.0)
Platelets: 419 10*3/uL — ABNORMAL HIGH (ref 150.0–400.0)
RBC: 4.26 Mil/uL (ref 3.87–5.11)
RDW: 13.1 % (ref 11.5–15.5)
WBC: 5.9 10*3/uL (ref 4.0–10.5)

## 2014-07-17 ENCOUNTER — Other Ambulatory Visit: Payer: Self-pay | Admitting: Family Medicine

## 2014-07-30 ENCOUNTER — Other Ambulatory Visit: Payer: Self-pay | Admitting: Family Medicine

## 2014-07-31 NOTE — Telephone Encounter (Signed)
Please refill for a year  

## 2014-07-31 NOTE — Telephone Encounter (Signed)
Electronic refill request, last refilled on 09/05/13 #90 with 3 refills, please advise

## 2014-07-31 NOTE — Telephone Encounter (Signed)
done

## 2014-08-23 ENCOUNTER — Telehealth: Payer: Self-pay | Admitting: Family Medicine

## 2014-08-23 DIAGNOSIS — E785 Hyperlipidemia, unspecified: Secondary | ICD-10-CM

## 2014-08-23 DIAGNOSIS — E119 Type 2 diabetes mellitus without complications: Secondary | ICD-10-CM

## 2014-08-23 NOTE — Telephone Encounter (Signed)
-----   Message from Ellamae Sia sent at 08/22/2014 10:22 AM EST ----- Regarding: Lab orders for Friday, 2.12.16 Lab orders for a 6 month appt

## 2014-08-25 ENCOUNTER — Other Ambulatory Visit (INDEPENDENT_AMBULATORY_CARE_PROVIDER_SITE_OTHER): Payer: BC Managed Care – PPO

## 2014-08-25 DIAGNOSIS — E785 Hyperlipidemia, unspecified: Secondary | ICD-10-CM

## 2014-08-25 DIAGNOSIS — E119 Type 2 diabetes mellitus without complications: Secondary | ICD-10-CM

## 2014-08-25 LAB — LIPID PANEL
Cholesterol: 149 mg/dL (ref 0–200)
HDL: 34.4 mg/dL — ABNORMAL LOW (ref >39.00)
NonHDL: 114.6
Total CHOL/HDL Ratio: 4
Triglycerides: 296 mg/dL — ABNORMAL HIGH (ref 0.0–149.0)
VLDL: 59.2 mg/dL — AB (ref 0.0–40.0)

## 2014-08-25 LAB — LDL CHOLESTEROL, DIRECT: Direct LDL: 92 mg/dL

## 2014-08-25 LAB — HEMOGLOBIN A1C: HEMOGLOBIN A1C: 6.5 % (ref 4.6–6.5)

## 2014-08-31 ENCOUNTER — Other Ambulatory Visit: Payer: Self-pay | Admitting: Family Medicine

## 2014-09-01 ENCOUNTER — Ambulatory Visit: Payer: BC Managed Care – PPO | Admitting: Family Medicine

## 2014-09-11 ENCOUNTER — Encounter: Payer: Self-pay | Admitting: Family Medicine

## 2014-09-11 ENCOUNTER — Ambulatory Visit (INDEPENDENT_AMBULATORY_CARE_PROVIDER_SITE_OTHER): Payer: BC Managed Care – PPO | Admitting: Family Medicine

## 2014-09-11 VITALS — BP 138/80 | HR 113 | Temp 98.2°F | Ht 60.0 in | Wt 160.1 lb

## 2014-09-11 DIAGNOSIS — E785 Hyperlipidemia, unspecified: Secondary | ICD-10-CM

## 2014-09-11 DIAGNOSIS — E119 Type 2 diabetes mellitus without complications: Secondary | ICD-10-CM

## 2014-09-11 DIAGNOSIS — I10 Essential (primary) hypertension: Secondary | ICD-10-CM

## 2014-09-11 MED ORDER — MECLIZINE HCL 25 MG PO TABS: 25.0000 mg | ORAL_TABLET | Freq: Three times a day (TID) | ORAL | Status: DC | PRN

## 2014-09-11 MED ORDER — OMEPRAZOLE 20 MG PO CPDR: DELAYED_RELEASE_CAPSULE | ORAL | Status: DC

## 2014-09-11 NOTE — Patient Instructions (Signed)
Cholesterol is stable Exercise more when you feel better to increase good cholesterol (HDL) Sugar control is stable  Follow up in 6 months for annual exam with labs prior

## 2014-09-11 NOTE — Assessment & Plan Note (Signed)
Lab Results  Component Value Date   HGBA1C 6.5 08/25/2014   Stable and well controlled  Rev diet and exercise habits

## 2014-09-11 NOTE — Assessment & Plan Note (Signed)
  bp in fair control at this time  BP Readings from Last 1 Encounters:  09/11/14 138/80   No changes needed Disc lifstyle change with low sodium diet and exercise  Labs reviewed F/u in 6 mo for annual exam

## 2014-09-11 NOTE — Progress Notes (Signed)
Pre visit review using our clinic review tool, if applicable. No additional management support is needed unless otherwise documented below in the visit note. 

## 2014-09-11 NOTE — Assessment & Plan Note (Signed)
Disc goals for lipids and reasons to control them Rev labs with pt Rev low sat fat diet in detail Intol of zocor-but LDL is still at goal  Trig stable on fenofibrate Enc exercise to inc HDL

## 2014-09-11 NOTE — Progress Notes (Signed)
Subjective:    Patient ID: Martha Hamilton, female    DOB: 1951-01-01, 64 y.o.   MRN: [id number]  HPI Here for f/u of chronic health problem   Just had a bad stomach bug  Gagged horribly - she has a hard time vomiting  Lot of diarrhea  Feeling better - still not able to eat everything (applesauce and potatoes-bland stuff) Wt is down 11 lb  bmi of 31  bp is stable today  No cp or palpitations or headaches or edema  No side effects to medicines  BP Readings from Last 3 Encounters:  09/11/14 148/68  03/01/14 146/74  10/24/13 146/90     Cholesterolemia On tricor Could not tolerate zocor- muscle pain Lab Results  Component Value Date   CHOL 149 08/25/2014   CHOL 126 02/13/2014   CHOL 131 05/10/2013   Lab Results  Component Value Date   HDL 34.40* 08/25/2014   HDL 34.70* 02/13/2014   HDL 41.10 05/10/2013   Lab Results  Component Value Date   LDLCALC 51 10/21/2011   LDLCALC 48 04/29/2010   Lab Results  Component Value Date   TRIG 296.0* 08/25/2014   TRIG 301.0* 02/13/2014   TRIG 292.0* 05/10/2013   Lab Results  Component Value Date   CHOLHDL 4 08/25/2014   CHOLHDL 4 02/13/2014   CHOLHDL 3 05/10/2013   Lab Results  Component Value Date   LDLDIRECT 92.0 08/25/2014   LDLDIRECT 65.8 02/13/2014   LDLDIRECT 62.5 05/10/2013   overall fairly stable  HDL is still too low  On tricor alone    Diabetes Home sugar results -stable  DM diet about the same  Exercise -before she got sick - used a stepper for cardiac/ also uses Wii fit system Symptoms none  A1C last  Lab Results  Component Value Date   HGBA1C 6.5 08/25/2014   This is down from 6.6  No problems with medications  Renal protection ARB Last eye exam -has appt for next month- will be due   Patient Active Problem List   Diagnosis Date Noted  . Pain in joint, shoulder region 06/14/2013  . Wrist pain, acute 06/14/2013  . Anemia 02/09/2013  . Abnormal EKG 07/27/2012  . Preoperative clearance 07/27/2012   . Special screening for malignant neoplasms, colon 04/29/2011  . Gynecological examination 04/29/2011  . Routine general medical examination at a health care facility 04/20/2011  . Right shoulder pain 11/18/2010  . OSTEOARTHRITIS, HANDS, BILATERAL 10/31/2008  . Diabetes type 2, controlled 07/10/2008  . Hyperlipidemia 01/26/2008  . Essential hypertension 01/26/2008  . ALLERGIC RHINITIS 12/07/2007   Past Medical History  Diagnosis Date  . Allergic rhinitis   . DM2 (diabetes mellitus, type 2)   . Hypertension   . Osteoarthritis     hands  . Obesity   . Breast cancer   . GERD (gastroesophageal reflux disease)   . Hx of cardiovascular stress test     a. Lex MV 2/14:  EF 69%, no ischemia  . Iron deficiency anemia    Past Surgical History  Procedure Laterality Date  . Gastric restriction surgery      for reflux  . Appendectomy  1976  . Breast lumpectomy Right     right breast, lumpectomy w/nodes  . Hysteroscopy w/d&c N/A 10/18/2012    Procedure: DILATATION AND CURETTAGE /HYSTEROSCOPY;  Surgeon: Mora Bellman, MD;  Location: [facility];  Service: Gynecology;  Laterality: N/A;  . Polypectomy N/A 10/18/2012    Procedure: POLYPECTOMY;  Surgeon:  Mora Bellman, MD;  Location: [facility];  Service: Gynecology;  Laterality: N/A;   History  Substance Use Topics  . Smoking status: Never Smoker   . Smokeless tobacco: Never Used  . Alcohol Use: No   Family History  Problem Relation Age of Onset  . Diabetes Mother   . Heart disease Mother     Pacemaker, CHF  . Stroke Father   . CAD Father 82    Died with MI  . Esophageal cancer Brother   . Colon cancer Neg Hx   . Rectal cancer Neg Hx   . Stomach cancer Neg Hx   . Hypertension Mother   . Kidney cancer Mother   . Heart disease Sister   . Diabetes Sister    Allergies  Allergen Reactions  . Cholestyramine Other (See Comments)    REACTION: Muscles tightened up, drew up.  . Lipitor [Atorvastatin] Other (See Comments)    Muscle cramping    . Niacin Nausea And Vomiting  . Simvastatin     Muscular pain   Current Outpatient Prescriptions on File Prior to Visit  Medication Sig Dispense Refill  . ACCU-CHEK COMPACT PLUS test strip TEST BLOOD SUGAR ONCE DAILY FOR DIABETES MELLITUS 100 each 2  . ACCU-CHEK SOFTCLIX LANCETS lancets USE TO CHECK BLOOD SUGARS ONCE DAILY FOR DM DX. E11.9 100 each 0  . acetaminophen (TYLENOL) 500 MG tablet Take 500 mg by mouth every 6 (six) hours as needed for mild pain.     Marland Kitchen amitriptyline (ELAVIL) 25 MG tablet TAKE 1 TABLET AT BEDTIME 90 tablet 3  . aspirin 81 MG tablet Take 81 mg by mouth daily.    . Cholecalciferol (VITAMIN D) 1000 UNITS capsule Take 2,000 Units by mouth daily.     . diclofenac sodium (VOLTAREN) 1 % GEL Apply 1 application topically as needed. For arthritis in finger    . fenofibrate (TRICOR) 145 MG tablet TAKE 1 TABLET DAILY 90 tablet 3  . ferrous sulfate 325 (65 FE) MG EC tablet Take 1 tablet (325 mg total) by mouth 3 (three) times daily with meals. 90 tablet 6  . glipiZIDE (GLIPIZIDE XL) 2.5 MG 24 hr tablet take 1 tablet by mouth once daily IN THE EVENING 90 tablet 3  . Glucosamine-MSM-Hyaluronic Acd (JOINT HEALTH) 750-375-30 MG TABS Take 2 tablets by mouth daily.    . hydrochlorothiazide (MICROZIDE) 12.5 MG capsule TAKE 1 CAPSULE DAILY 90 capsule 3  . losartan (COZAAR) 100 MG tablet TAKE 1 TABLET DAILY 90 tablet 3  . meclizine (ANTIVERT) 25 MG tablet Take 1 tablet (25 mg total) by mouth 3 (three) times daily as needed. 90 tablet 0  . metFORMIN (GLUCOPHAGE) 1000 MG tablet TAKE 1 TABLET TWICE A DAY WITH A MEAL 180 tablet 3  . Omega-3 Fatty Acids (FISH OIL) 1200 MG CAPS Take 1 capsule by mouth daily.    Marland Kitchen omeprazole (PRILOSEC) 20 MG capsule TAKE 1 CAPSULE DAILY 90 capsule 3  . OVER THE COUNTER MEDICATION Vitalizer Gold without Vitamin  K taking one group of supplement daily.     Marland Kitchen SALINE NASAL SPRAY NA Place 1 spray into the nose daily.      No current facility-administered  medications on file prior to visit.        Review of Systems Review of Systems  Constitutional: Negative for fever, appetite change, and unexpected weight change.  Eyes: Negative for pain and visual disturbance.  Respiratory: Negative for cough and shortness of breath.   Cardiovascular: Negative  for cp or palpitations    Gastrointestinal: Negative for nausea, diarrhea and constipation. (resolved n/v/d) Genitourinary: Negative for urgency and frequency.  Skin: Negative for pallor or rash   Neurological: Negative for weakness, light-headedness, numbness and headaches.  Hematological: Negative for adenopathy. Does not bruise/bleed easily.  Psychiatric/Behavioral: Negative for dysphoric mood. The patient is not nervous/anxious.         Objective:   Physical Exam  Constitutional: She appears well-developed and well-nourished. No distress.  obese and well appearing   HENT:  Head: Normocephalic and atraumatic.  Mouth/Throat: Oropharynx is clear and moist.  Eyes: Conjunctivae and EOM are normal. Pupils are equal, round, and reactive to light. No scleral icterus.  Neck: Normal range of motion. Neck supple. No JVD present. Carotid bruit is not present. No thyromegaly present.  Cardiovascular: Normal rate, regular rhythm and normal heart sounds.   Pulmonary/Chest: Effort normal and breath sounds normal. No respiratory distress. She has no wheezes. She has no rales.  Abdominal: Soft. Bowel sounds are normal. She exhibits no distension, no abdominal bruit and no mass. There is no tenderness.  Musculoskeletal: She exhibits no edema.  Lymphadenopathy:    She has no cervical adenopathy.  Neurological: She is alert. She has normal reflexes. No cranial nerve deficit. She exhibits normal muscle tone. Coordination normal.  Skin: Skin is warm and dry. No rash noted.  Psychiatric: She has a normal mood and affect.          Assessment & Plan:   Problem List Items Addressed This Visit       Cardiovascular and Mediastinum   Essential hypertension - Primary     bp in fair control at this time  BP Readings from Last 1 Encounters:  09/11/14 138/80   No changes needed Disc lifstyle change with low sodium diet and exercise  Labs reviewed F/u in 6 mo for annual exam         Endocrine   Diabetes type 2, controlled    Lab Results  Component Value Date   HGBA1C 6.5 08/25/2014   Stable and well controlled  Rev diet and exercise habits        Other   Hyperlipidemia    Disc goals for lipids and reasons to control them Rev labs with pt Rev low sat fat diet in detail Intol of zocor-but LDL is still at goal  Trig stable on fenofibrate Enc exercise to inc HDL

## 2014-09-20 LAB — HM DIABETES EYE EXAM

## 2014-10-15 ENCOUNTER — Other Ambulatory Visit: Payer: Self-pay | Admitting: Family Medicine

## 2014-11-06 ENCOUNTER — Other Ambulatory Visit: Payer: Self-pay | Admitting: Cardiology

## 2014-11-14 ENCOUNTER — Ambulatory Visit (INDEPENDENT_AMBULATORY_CARE_PROVIDER_SITE_OTHER): Payer: BC Managed Care – PPO | Admitting: Family Medicine

## 2014-11-14 ENCOUNTER — Encounter: Payer: Self-pay | Admitting: Family Medicine

## 2014-11-14 VITALS — BP 140/64 | HR 103 | Temp 98.0°F | Ht 60.0 in | Wt 163.5 lb

## 2014-11-14 DIAGNOSIS — M25511 Pain in right shoulder: Secondary | ICD-10-CM

## 2014-11-14 MED ORDER — MELOXICAM 15 MG PO TABS: 15.0000 mg | ORAL_TABLET | Freq: Every day | ORAL | Status: DC | PRN

## 2014-11-14 NOTE — Progress Notes (Signed)
Pre visit review using our clinic review tool, if applicable. No additional management support is needed unless otherwise documented below in the visit note. 

## 2014-11-14 NOTE — Patient Instructions (Signed)
I think you have tendonitis/bursitis of your shoulder  Use ice whenever you can  Do the finger crawl on the wall exercise to prevent frozen shoulder/stiffness  Take meloxicam 15 mg with food daily for 2 weeks  If no improvement at that time - call for an appt with Dr Lorelei Pont

## 2014-11-14 NOTE — Progress Notes (Signed)
Subjective:    Patient ID: Martha Hamilton, female    DOB: May 09, 1951, 64 y.o.   MRN: [id number]  HPI Here with R shoulder pain   Started 3 weeks ago   No particular trauma or injury  Normal every day activity (caring for her mother)-has to help her walk and occ catch her  Just woke up with dull pain and it is getting worse Started in the posterior shoulder and it is sore under her bra strap (no redness or rash)     Hurts to lie on that side  Hurts to raise arm above her head  This am pain started 'drawing" into her neck and into her arm  Wonders if she has bursitis   Is R handed      Chemistry      Component Value Date/Time   NA 136 02/13/2014 0819   K 3.7 02/13/2014 0819   CL 100 02/13/2014 0819   CO2 27 02/13/2014 0819   BUN 14 02/13/2014 0819   CREATININE 0.6 02/13/2014 0819      Component Value Date/Time   CALCIUM 9.5 02/13/2014 0819   ALKPHOS 28* 02/13/2014 0819   AST 25 02/13/2014 0819   ALT 25 02/13/2014 0819   BILITOT 0.3 02/13/2014 0819        Patient Active Problem List   Diagnosis Date Noted  . Pain in joint, shoulder region 06/14/2013  . Wrist pain, acute 06/14/2013  . Anemia 02/09/2013  . Abnormal EKG 07/27/2012  . Preoperative clearance 07/27/2012  . Special screening for malignant neoplasms, colon 04/29/2011  . Gynecological examination 04/29/2011  . Routine general medical examination at a health care facility 04/20/2011  . Right shoulder pain 11/18/2010  . OSTEOARTHRITIS, HANDS, BILATERAL 10/31/2008  . Diabetes type 2, controlled 07/10/2008  . Hyperlipidemia 01/26/2008  . Essential hypertension 01/26/2008  . ALLERGIC RHINITIS 12/07/2007   Past Medical History  Diagnosis Date  . Allergic rhinitis   . DM2 (diabetes mellitus, type 2)   . Hypertension   . Osteoarthritis     hands  . Obesity   . Breast cancer   . GERD (gastroesophageal reflux disease)   . Hx of cardiovascular stress test     a. Lex MV 2/14:  EF 69%, no ischemia  . Iron  deficiency anemia    Past Surgical History  Procedure Laterality Date  . Gastric restriction surgery      for reflux  . Appendectomy  1976  . Breast lumpectomy Right     right breast, lumpectomy w/nodes  . Hysteroscopy w/d&c N/A 10/18/2012    Procedure: DILATATION AND CURETTAGE /HYSTEROSCOPY;  Surgeon: Mora Bellman, MD;  Location: [facility];  Service: Gynecology;  Laterality: N/A;  . Polypectomy N/A 10/18/2012    Procedure: POLYPECTOMY;  Surgeon: Mora Bellman, MD;  Location: [facility];  Service: Gynecology;  Laterality: N/A;   History  Substance Use Topics  . Smoking status: Never Smoker   . Smokeless tobacco: Never Used  . Alcohol Use: No   Family History  Problem Relation Age of Onset  . Diabetes Mother   . Heart disease Mother     Pacemaker, CHF  . Stroke Father   . CAD Father 9    Died with MI  . Esophageal cancer Brother   . Colon cancer Neg Hx   . Rectal cancer Neg Hx   . Stomach cancer Neg Hx   . Hypertension Mother   . Kidney cancer Mother   . Heart disease Sister   .  Diabetes Sister    Allergies  Allergen Reactions  . Cholestyramine Other (See Comments)    REACTION: Muscles tightened up, drew up.  . Lipitor [Atorvastatin] Other (See Comments)    Muscle cramping  . Niacin Nausea And Vomiting  . Simvastatin     Muscular pain   Current Outpatient Prescriptions on File Prior to Visit  Medication Sig Dispense Refill  . ACCU-CHEK COMPACT PLUS test strip TEST BLOOD SUGAR ONCE DAILY FOR DIABETES MELLITUS 100 each 2  . ACCU-CHEK SOFTCLIX LANCETS lancets USE ONCE DAILY TO CHECK BLOOD SUGARS FOR DIABETES MELLITUS 200 each 11  . acetaminophen (TYLENOL) 500 MG tablet Take 500 mg by mouth every 6 (six) hours as needed for mild pain.     Marland Kitchen amitriptyline (ELAVIL) 25 MG tablet TAKE 1 TABLET AT BEDTIME 90 tablet 3  . aspirin 81 MG tablet Take 81 mg by mouth daily.    . Cholecalciferol (VITAMIN D) 1000 UNITS capsule Take 2,000 Units by mouth daily.     . diclofenac sodium  (VOLTAREN) 1 % GEL Apply 1 application topically as needed. For arthritis in finger    . fenofibrate (TRICOR) 145 MG tablet TAKE 1 TABLET DAILY 90 tablet 3  . ferrous sulfate 325 (65 FE) MG EC tablet Take 1 tablet (325 mg total) by mouth 3 (three) times daily with meals. 90 tablet 6  . glipiZIDE (GLIPIZIDE XL) 2.5 MG 24 hr tablet take 1 tablet by mouth once daily IN THE EVENING 90 tablet 3  . hydrochlorothiazide (MICROZIDE) 12.5 MG capsule TAKE 1 CAPSULE DAILY 90 capsule 3  . losartan (COZAAR) 100 MG tablet TAKE 1 TABLET DAILY 90 tablet 3  . meclizine (ANTIVERT) 25 MG tablet Take 1 tablet (25 mg total) by mouth 3 (three) times daily as needed. 90 tablet 0  . metFORMIN (GLUCOPHAGE) 1000 MG tablet TAKE 1 TABLET TWICE A DAY WITH A MEAL 180 tablet 3  . Omega-3 Fatty Acids (FISH OIL) 1200 MG CAPS Take 1 capsule by mouth daily.    Marland Kitchen omeprazole (PRILOSEC) 20 MG capsule TAKE 1 CAPSULE DAILY 90 capsule 3  . OVER THE COUNTER MEDICATION Vitalizer Gold without Vitamin  K taking one group of supplement daily.     Marland Kitchen SALINE NASAL SPRAY NA Place 1 spray into the nose daily.      No current facility-administered medications on file prior to visit.     Review of Systems Review of Systems  Constitutional: Negative for fever, appetite change, fatigue and unexpected weight change.  Eyes: Negative for pain and visual disturbance.  Respiratory: Negative for cough and shortness of breath.   Cardiovascular: Negative for cp or palpitations    Gastrointestinal: Negative for nausea, diarrhea and constipation.  Genitourinary: Negative for urgency and frequency.  Skin: Negative for pallor or rash   MSK pos for R shoulder pain, neg for joint swelling  Neurological: Negative for weakness, light-headedness, numbness and headaches.  Hematological: Negative for adenopathy. Does not bruise/bleed easily.  Psychiatric/Behavioral: Negative for dysphoric mood. The patient is not nervous/anxious.         Objective:    Physical Exam  Constitutional: She appears well-developed and well-nourished. No distress.  obese and well appearing   HENT:  Head: Normocephalic and atraumatic.  Eyes: Conjunctivae and EOM are normal. Pupils are equal, round, and reactive to light.  Neck: Normal range of motion. Neck supple.  Cardiovascular: Normal rate and regular rhythm.   Musculoskeletal: She exhibits tenderness. She exhibits no edema.  Right shoulder: She exhibits decreased range of motion and tenderness. She exhibits no swelling, no effusion, no crepitus, no deformity, normal pulse and normal strength.  Tender over acromion  Pain to abduct over 90 degrees  Mildly pos Hawking/neer testss No bicep tenderness   Discomfort to fully int/ext rotate   No neuro changes   Neurological: She is alert. She has normal strength and normal reflexes. She displays no atrophy. No sensory deficit.  Skin: Skin is warm and dry. No rash noted. No erythema.  Psychiatric: She has a normal mood and affect.          Assessment & Plan:   Problem List Items Addressed This Visit      Other   Right shoulder pain - Primary    Pt has prev had problems with L shoulder that imp after PT (of note) Suspect bursitis/tendonitis  Ice and passive rom exercises disc  mobic 15 mg daily for 14 d  If no imp-will f/u with Dr Lorelei Pont for eval and tx -disc poss of inj or PT

## 2014-11-14 NOTE — Assessment & Plan Note (Signed)
Pt has prev had problems with L shoulder that imp after PT (of note) Suspect bursitis/tendonitis  Ice and passive rom exercises disc  mobic 15 mg daily for 14 d  If no imp-will f/u with Dr Lorelei Pont for eval and tx -disc poss of inj or PT

## 2014-12-10 ENCOUNTER — Other Ambulatory Visit: Payer: Self-pay | Admitting: Cardiology

## 2014-12-18 ENCOUNTER — Telehealth: Payer: Self-pay

## 2014-12-18 NOTE — Telephone Encounter (Signed)
-----   Message from Maury Dus, RN sent at 06/20/2014  9:42 AM EST ----- Regarding: CBC Pt needs CBC, order in epic.

## 2014-12-18 NOTE — Telephone Encounter (Signed)
Pt aware.

## 2014-12-19 ENCOUNTER — Other Ambulatory Visit: Payer: Self-pay

## 2014-12-19 ENCOUNTER — Other Ambulatory Visit (INDEPENDENT_AMBULATORY_CARE_PROVIDER_SITE_OTHER): Payer: BC Managed Care – PPO

## 2014-12-19 DIAGNOSIS — D508 Other iron deficiency anemias: Secondary | ICD-10-CM | POA: Diagnosis not present

## 2014-12-19 DIAGNOSIS — D509 Iron deficiency anemia, unspecified: Secondary | ICD-10-CM

## 2014-12-19 LAB — CBC WITH DIFFERENTIAL/PLATELET
BASOS ABS: 0.1 10*3/uL (ref 0.0–0.1)
BASOS PCT: 1 % (ref 0.0–3.0)
EOS PCT: 4.6 % (ref 0.0–5.0)
Eosinophils Absolute: 0.3 10*3/uL (ref 0.0–0.7)
HCT: 37 % (ref 36.0–46.0)
Hemoglobin: 12.4 g/dL (ref 12.0–15.0)
Lymphocytes Relative: 38 % (ref 12.0–46.0)
Lymphs Abs: 2.6 10*3/uL (ref 0.7–4.0)
MCHC: 33.7 g/dL (ref 30.0–36.0)
MCV: 86.9 fl (ref 78.0–100.0)
Monocytes Absolute: 0.5 10*3/uL (ref 0.1–1.0)
Monocytes Relative: 7.8 % (ref 3.0–12.0)
Neutro Abs: 3.3 10*3/uL (ref 1.4–7.7)
Neutrophils Relative %: 48.6 % (ref 43.0–77.0)
PLATELETS: 391 10*3/uL (ref 150.0–400.0)
RBC: 4.25 Mil/uL (ref 3.87–5.11)
RDW: 13.4 % (ref 11.5–15.5)
WBC: 6.8 10*3/uL (ref 4.0–10.5)

## 2014-12-20 ENCOUNTER — Ambulatory Visit (INDEPENDENT_AMBULATORY_CARE_PROVIDER_SITE_OTHER): Payer: BC Managed Care – PPO | Admitting: Family Medicine

## 2014-12-20 ENCOUNTER — Encounter: Payer: Self-pay | Admitting: Family Medicine

## 2014-12-20 VITALS — BP 140/80 | HR 103 | Temp 99.1°F | Ht 60.0 in | Wt 165.0 lb

## 2014-12-20 DIAGNOSIS — M7501 Adhesive capsulitis of right shoulder: Secondary | ICD-10-CM | POA: Diagnosis not present

## 2014-12-20 MED ORDER — METHYLPREDNISOLONE ACETATE 40 MG/ML IJ SUSP
80.0000 mg | Freq: Once | INTRAMUSCULAR | Status: AC
  Administered 2014-12-20: 80 mg via INTRA_ARTICULAR

## 2014-12-20 NOTE — Progress Notes (Signed)
Dr. Frederico Hamman T. Myrick Mcnairy, MD, [facility] [place], [address] Phone: [phone] Fax: [phone]  12/20/2014  Patient: Martha Hamilton, MRN: [id number], DOB: [birth year], 64 y.o.  Primary Physician:  Loura Pardon, MD  Chief Complaint: Shoulder Pain  Subjective:   Martha Hamilton is a 64 y.o. very pleasant female patient who presents with the following: shoulder pain  The patient noted above presents with shoulder pain that has been ongoing for 2 months, insidious onset.  there is no history of trauma or accident. The patient denies neck pain or radicular symptoms. No shoulder blade pain Denies dislocation, subluxation, separation of the shoulder. The patient does complain of pain with flexion, abduction, and terminal motion.  Significant restriction of motion. she describes a deep ache around the shoulder, and sometimes it will wake the patient up at night.  R shoulder, normally does not have a problem.  About 10/15/2014, then cannot lift it without a lot of pain.  S/p R breast cancer shoulder.   Driving car, she will - alter movement Placed on some Mobic by Dr. Glori Bickers.  Pain got better.   Medications Tried: NSAIDS Ice or Heat: minimal help Tried PT: No  Prior shoulder Injury: No Prior surgery: BR cancer surgery Prior fracture: No  Past Medical History, Surgical History, Social History, Family History, Medications, and allergies reviewed and updated if relevant.   Patient Active Problem List   Diagnosis Date Noted  . Pain in joint, shoulder region 06/14/2013  . Wrist pain, acute 06/14/2013  . Anemia 02/09/2013  . Abnormal EKG 07/27/2012  . Preoperative clearance 07/27/2012  . Special screening for malignant neoplasms, colon 04/29/2011  . Gynecological examination 04/29/2011  . Routine general medical examination at a health care facility 04/20/2011  . Right shoulder pain 11/18/2010  . OSTEOARTHRITIS, HANDS, BILATERAL  10/31/2008  . Diabetes type 2, controlled 07/10/2008  . Hyperlipidemia 01/26/2008  . Essential hypertension 01/26/2008  . ALLERGIC RHINITIS 12/07/2007    Past Medical History  Diagnosis Date  . Allergic rhinitis   . DM2 (diabetes mellitus, type 2)   . Hypertension   . Osteoarthritis     hands  . Obesity   . Breast cancer   . GERD (gastroesophageal reflux disease)   . Hx of cardiovascular stress test     a. Lex MV 2/14:  EF 69%, no ischemia  . Iron deficiency anemia     Past Surgical History  Procedure Laterality Date  . Gastric restriction surgery      for reflux  . Appendectomy  1976  . Breast lumpectomy Right     right breast, lumpectomy w/nodes  . Hysteroscopy w/d&c N/A 10/18/2012    Procedure: DILATATION AND CURETTAGE /HYSTEROSCOPY;  Surgeon: Mora Bellman, MD;  Location: [facility];  Service: Gynecology;  Laterality: N/A;  . Polypectomy N/A 10/18/2012    Procedure: POLYPECTOMY;  Surgeon: Mora Bellman, MD;  Location: [facility];  Service: Gynecology;  Laterality: N/A;    History   Social History  . Marital Status: Single    Spouse Name: N/A  . Number of Children: 0  . Years of Education: N/A   Occupational History  . retired    Social History Main Topics  . Smoking status: Never Smoker   . Smokeless tobacco: Never Used  . Alcohol Use: No  . Drug Use: No  . Sexual Activity: Not Currently    Birth Control/ Protection: Post-menopausal   Other Topics Concern  .  Not on file   Social History Narrative   Lives with, and cares for her elderly mother.    Family History  Problem Relation Age of Onset  . Diabetes Mother   . Heart disease Mother     Pacemaker, CHF  . Stroke Father   . CAD Father 66    Died with MI  . Esophageal cancer Brother   . Colon cancer Neg Hx   . Rectal cancer Neg Hx   . Stomach cancer Neg Hx   . Hypertension Mother   . Kidney cancer Mother   . Heart disease Sister   . Diabetes Sister     Allergies  Allergen Reactions  .  Cholestyramine Other (See Comments)    REACTION: Muscles tightened up, drew up.  . Lipitor [Atorvastatin] Other (See Comments)    Muscle cramping  . Niacin Nausea And Vomiting  . Simvastatin     Muscular pain    Medication list reviewed and updated in full in [place].  GEN: No fevers, chills. Nontoxic. Primarily MSK c/o today. MSK: Detailed in the HPI GI: tolerating PO intake without difficulty Neuro: No numbness, parasthesias, or tingling associated. Otherwise the pertinent positives of the ROS are noted above.    Objective:   Blood pressure 140/80, pulse 103, temperature 99.1 F (37.3 C), temperature source Oral, height 5' (1.524 m), weight 165 lb (74.844 kg), last menstrual period 06/13/2001.  GEN: Well-developed,well-nourished,in no acute distress; alert,appropriate and cooperative throughout examination HEENT: Normocephalic and atraumatic without obvious abnormalities. Ears, externally no deformities PULM: Breathing comfortably in no respiratory distress EXT: No clubbing, cyanosis, or edema PSYCH: Normally interactive. Cooperative during the interview. Pleasant. Friendly and conversant. Not anxious or depressed appearing. Normal, full affect.  Shoulder: R Inspection: No muscle wasting or winging Ecchymosis/edema: neg  AC joint, scapula, clavicle: TTP AC JT Cervical spine: NT, full ROM Spurling's: neg Abduction: 5/5, 145 deg Flexion: 5/5, 160 deg IR, full, lift-off: 5/5, none at 90 deg abd ER at neutral: 5/5, approaching full AC crossover and compression: POS Additional special testing is equivocal given lack of motion C5-T1 intact Sensation intact Grip 5/5  Assessment and Plan:   Adhesive capsulitis of shoulder, right - Plan: Ambulatory referral to Physical Therapy, methylPREDNISolone acetate (DEPO-MEDROL) injection 80 mg  >25 minutes spent in face to face time with patient, >50% spent in counselling or coordination of care  Patient was given a  systematic ROM protocol from [facility] or [facility] to be done daily. Emphasized importance of adherence, help of PT, daily HEP.  The average length of total symptoms is 12-18 months going through 3 different phases in the freezing and thawing process. Reviewed all with patient.   Tylenol or NSAID of choice prn for pain relief Intraarticular shoulder injections discussed with patient, which have good evidence for accelerating the thawing phase.  Patient will be sent for formal PT for aggressive frozen shoulder ROM. Will need RTC str and scapular stabilization to fix underlying mechanics.  Intrarticular Shoulder Injection, R Verbal consent was obtained from the patient. Risks including infection explained and contrasted with benefits and alternatives. Patient prepped with Chloraprep and Ethyl Chloride used for anesthesia. An intraarticular shoulder injection was performed using the posterior approach. The patient tolerated the procedure well and had decreased pain post injection. No complications. Injection: 8 cc of Lidocaine 1% and Depo-Medrol 80 mg. Needle: 22 gauge, 2 in  Decreased pain and increased ROM post-injection  Follow-up: 2 mo  New Prescriptions   No medications  on file   Orders Placed This Encounter  Procedures  . Ambulatory referral to Physical Therapy    Signed,  Frederico Hamman T. Geraldyn Shain, MD   Patient's Medications  New Prescriptions   No medications on file  Previous Medications   ACCU-CHEK COMPACT PLUS TEST STRIP    TEST BLOOD SUGAR ONCE DAILY FOR DIABETES MELLITUS   ACCU-CHEK SOFTCLIX LANCETS LANCETS    USE ONCE DAILY TO CHECK BLOOD SUGARS FOR DIABETES MELLITUS   ACETAMINOPHEN (TYLENOL) 500 MG TABLET    Take 500 mg by mouth every 6 (six) hours as needed for mild pain.    AMITRIPTYLINE (ELAVIL) 25 MG TABLET    TAKE 1 TABLET AT BEDTIME   ASPIRIN 81 MG TABLET    Take 81 mg by mouth daily.   CHOLECALCIFEROL (VITAMIN D) 1000 UNITS CAPSULE    Take 2,000 Units by mouth daily.      DICLOFENAC SODIUM (VOLTAREN) 1 % GEL    Apply 1 application topically as needed. For arthritis in finger   FENOFIBRATE (TRICOR) 145 MG TABLET    TAKE 1 TABLET DAILY   FERROUS SULFATE 325 (65 FE) MG EC TABLET    Take 1 tablet (325 mg total) by mouth 3 (three) times daily with meals.   GLIPIZIDE (GLIPIZIDE XL) 2.5 MG 24 HR TABLET    take 1 tablet by mouth once daily IN THE EVENING   HYDROCHLOROTHIAZIDE (MICROZIDE) 12.5 MG CAPSULE    TAKE 1 CAPSULE DAILY   LOSARTAN (COZAAR) 100 MG TABLET    TAKE 1 TABLET DAILY   MECLIZINE (ANTIVERT) 25 MG TABLET    Take 1 tablet (25 mg total) by mouth 3 (three) times daily as needed.   MELOXICAM (MOBIC) 15 MG TABLET    Take 1 tablet (15 mg total) by mouth daily as needed for pain. With food   METFORMIN (GLUCOPHAGE) 1000 MG TABLET    TAKE 1 TABLET TWICE A DAY WITH A MEAL   OMEGA-3 FATTY ACIDS (FISH OIL) 1200 MG CAPS    Take 1 capsule by mouth daily.   OMEPRAZOLE (PRILOSEC) 20 MG CAPSULE    TAKE 1 CAPSULE DAILY   OVER THE COUNTER MEDICATION    Vitalizer Gold without Vitamin  K taking one group of supplement daily.    SALINE NASAL SPRAY NA    Place 1 spray into the nose daily.   Modified Medications   No medications on file  Discontinued Medications   No medications on file

## 2014-12-20 NOTE — Progress Notes (Signed)
Pre visit review using our clinic review tool, if applicable. No additional management support is needed unless otherwise documented below in the visit note. 

## 2015-01-30 ENCOUNTER — Other Ambulatory Visit: Payer: Self-pay | Admitting: Family Medicine

## 2015-02-20 ENCOUNTER — Other Ambulatory Visit: Payer: Self-pay | Admitting: Family Medicine

## 2015-02-21 ENCOUNTER — Ambulatory Visit (INDEPENDENT_AMBULATORY_CARE_PROVIDER_SITE_OTHER): Payer: BC Managed Care – PPO | Admitting: Family Medicine

## 2015-02-21 ENCOUNTER — Encounter: Payer: Self-pay | Admitting: Family Medicine

## 2015-02-21 VITALS — BP 136/74 | HR 99 | Temp 98.0°F | Wt 162.5 lb

## 2015-02-21 DIAGNOSIS — M7501 Adhesive capsulitis of right shoulder: Secondary | ICD-10-CM

## 2015-02-21 NOTE — Progress Notes (Signed)
Dr. Frederico Hamman T. Livian Vanderbeck, MD, [facility] Medicine [place], [address] Phone: [phone] Fax: [phone]  02/21/2015  Patient: Martha Hamilton, MRN: [id number], DOB: 1950/10/11, 64 y.o.  Primary Physician:  Loura Pardon, MD  Chief Complaint: Follow-up  Subjective:   Martha Hamilton is a 64 y.o. very pleasant female patient who presents with the following: shoulder pain  Shoulder is doing much better.  R frozen shoulder and doing much better now with full motion s/p injection and PT, dedicated HEP  12/20/2014 Last OV with Owens Loffler, MD  The patient noted above presents with shoulder pain that has been ongoing for 2 months, insidious onset.  there is no history of trauma or accident. The patient denies neck pain or radicular symptoms. No shoulder blade pain Denies dislocation, subluxation, separation of the shoulder. The patient does complain of pain with flexion, abduction, and terminal motion.  Significant restriction of motion. she describes a deep ache around the shoulder, and sometimes it will wake the patient up at night.  R shoulder, normally does not have a problem.  About 10/15/2014, then cannot lift it without a lot of pain.  S/p R breast cancer shoulder.   Driving car, she will - alter movement Placed on some Mobic by Dr. Glori Bickers.  Pain got better.   Medications Tried: NSAIDS Ice or Heat: minimal help Tried PT: No  Prior shoulder Injury: No Prior surgery: BR cancer surgery Prior fracture: No  Past Medical History, Surgical History, Social History, Family History, Medications, and allergies reviewed and updated if relevant.   Patient Active Problem List   Diagnosis Date Noted  . Pain in joint, shoulder region 06/14/2013  . Wrist pain, acute 06/14/2013  . Anemia 02/09/2013  . Abnormal EKG 07/27/2012  . Preoperative clearance 07/27/2012  . Special screening for malignant neoplasms, colon 04/29/2011  . Gynecological  examination 04/29/2011  . Routine general medical examination at a health care facility 04/20/2011  . Right shoulder pain 11/18/2010  . OSTEOARTHRITIS, HANDS, BILATERAL 10/31/2008  . Diabetes type 2, controlled 07/10/2008  . Hyperlipidemia 01/26/2008  . Essential hypertension 01/26/2008  . ALLERGIC RHINITIS 12/07/2007    Past Medical History  Diagnosis Date  . Allergic rhinitis   . DM2 (diabetes mellitus, type 2)   . Hypertension   . Osteoarthritis     hands  . Obesity   . Breast cancer   . GERD (gastroesophageal reflux disease)   . Hx of cardiovascular stress test     a. Lex MV 2/14:  EF 69%, no ischemia  . Iron deficiency anemia     Past Surgical History  Procedure Laterality Date  . Gastric restriction surgery      for reflux  . Appendectomy  1976  . Breast lumpectomy Right     right breast, lumpectomy w/nodes  . Hysteroscopy w/d&c N/A 10/18/2012    Procedure: DILATATION AND CURETTAGE /HYSTEROSCOPY;  Surgeon: Mora Bellman, MD;  Location: [facility];  Service: Gynecology;  Laterality: N/A;  . Polypectomy N/A 10/18/2012    Procedure: POLYPECTOMY;  Surgeon: Mora Bellman, MD;  Location: [facility];  Service: Gynecology;  Laterality: N/A;    Social History   Social History  . Marital Status: Single    Spouse Name: N/A  . Number of Children: 0  . Years of Education: N/A   Occupational History  . retired    Social History Main Topics  . Smoking status: Never Smoker   .  Smokeless tobacco: Never Used  . Alcohol Use: No  . Drug Use: No  . Sexual Activity: Not Currently    Birth Control/ Protection: Post-menopausal   Other Topics Concern  . Not on file   Social History Narrative   Lives with, and cares for her elderly mother.    Family History  Problem Relation Age of Onset  . Diabetes Mother   . Heart disease Mother     Pacemaker, CHF  . Stroke Father   . CAD Father 67    Died with MI  . Esophageal cancer Brother   . Colon cancer Neg Hx   . Rectal cancer Neg  Hx   . Stomach cancer Neg Hx   . Hypertension Mother   . Kidney cancer Mother   . Heart disease Sister   . Diabetes Sister     Allergies  Allergen Reactions  . Cholestyramine Other (See Comments)    REACTION: Muscles tightened up, drew up.  . Lipitor [Atorvastatin] Other (See Comments)    Muscle cramping  . Niacin Nausea And Vomiting  . Simvastatin     Muscular pain    Medication list reviewed and updated in full in [place].  GEN: No fevers, chills. Nontoxic. Primarily MSK c/o today. MSK: Detailed in the HPI GI: tolerating PO intake without difficulty Neuro: No numbness, parasthesias, or tingling associated. Otherwise the pertinent positives of the ROS are noted above.    Objective:   Blood pressure 136/74, pulse 99, temperature 98 F (36.7 C), temperature source Oral, weight 162 lb 8 oz (73.71 kg), last menstrual period 06/13/2001, SpO2 95 %.  GEN: Well-developed,well-nourished,in no acute distress; alert,appropriate and cooperative throughout examination HEENT: Normocephalic and atraumatic without obvious abnormalities. Ears, externally no deformities PULM: Breathing comfortably in no respiratory distress EXT: No clubbing, cyanosis, or edema PSYCH: Normally interactive. Cooperative during the interview. Pleasant. Friendly and conversant. Not anxious or depressed appearing. Normal, full affect.  Shoulder: R Inspection: No muscle wasting or winging Ecchymosis/edema: neg  AC joint, scapula, clavicle: nt Cervical spine: NT, full ROM Spurling's: neg Abduction: 5/5, 180 deg Flexion: 5/5, 180 deg IR, full, lift-off: 5/5, 65 deg at 90 deg abd ER at neutral: 5/5, full AC crossover and compression: nt C5-T1 intact Sensation intact Grip 5/5  Assessment and Plan:   Adhesive capsulitis of shoulder, right  Completely better  Follow-up: prn   Signed,  Barnett Elzey T. Bunny Lowdermilk, MD   Patient's Medications  New Prescriptions   No medications on file  Previous  Medications   ACCU-CHEK COMPACT PLUS TEST STRIP    TEST BLOOD SUGAR ONCE DAILY FOR DIABETES MELLITUS   ACCU-CHEK SOFTCLIX LANCETS LANCETS    USE ONCE DAILY TO CHECK BLOOD SUGARS FOR DIABETES MELLITUS   ACETAMINOPHEN (TYLENOL) 500 MG TABLET    Take 500 mg by mouth every 6 (six) hours as needed for mild pain.    AMITRIPTYLINE (ELAVIL) 25 MG TABLET    TAKE 1 TABLET AT BEDTIME   ASPIRIN 81 MG TABLET    Take 81 mg by mouth daily.   CHOLECALCIFEROL (VITAMIN D) 1000 UNITS CAPSULE    Take 2,000 Units by mouth daily.    DICLOFENAC SODIUM (VOLTAREN) 1 % GEL    Apply 1 application topically as needed. For arthritis in finger   FENOFIBRATE (TRICOR) 145 MG TABLET    TAKE 1 TABLET DAILY   FERROUS SULFATE 325 (65 FE) MG EC TABLET    Take 1 tablet (325 mg total) by mouth 3 (  three) times daily with meals.   GLIPIZIDE XL 2.5 MG 24 HR TABLET    TAKE 1 TABLET DAILY IN THE EVENING   HYDROCHLOROTHIAZIDE (MICROZIDE) 12.5 MG CAPSULE    TAKE 1 CAPSULE DAILY   LOSARTAN (COZAAR) 100 MG TABLET    TAKE 1 TABLET DAILY   MECLIZINE (ANTIVERT) 25 MG TABLET    Take 1 tablet (25 mg total) by mouth 3 (three) times daily as needed.   MELOXICAM (MOBIC) 15 MG TABLET    Take 1 tablet (15 mg total) by mouth daily as needed for pain. With food   METFORMIN (GLUCOPHAGE) 1000 MG TABLET    TAKE 1 TABLET TWICE A DAY WITH MEALS   OMEGA-3 FATTY ACIDS (FISH OIL) 1200 MG CAPS    Take 1 capsule by mouth daily.   OMEPRAZOLE (PRILOSEC) 20 MG CAPSULE    TAKE 1 CAPSULE DAILY   OVER THE COUNTER MEDICATION    Vitalizer Gold without Vitamin  K taking one group of supplement daily.    SALINE NASAL SPRAY NA    Place 1 spray into the nose daily.   Modified Medications   No medications on file  Discontinued Medications   No medications on file

## 2015-02-21 NOTE — Progress Notes (Signed)
Pre visit review using our clinic review tool, if applicable. No additional management support is needed unless otherwise documented below in the visit note. 

## 2015-03-01 ENCOUNTER — Telehealth: Payer: Self-pay | Admitting: Family Medicine

## 2015-03-01 DIAGNOSIS — Z0000 Encounter for general adult medical examination without abnormal findings: Secondary | ICD-10-CM

## 2015-03-01 DIAGNOSIS — E119 Type 2 diabetes mellitus without complications: Secondary | ICD-10-CM

## 2015-03-01 NOTE — Telephone Encounter (Signed)
-----   Message from Ellamae Sia sent at 02/28/2015 12:08 PM EDT ----- Regarding: Lab orders for Friday, 8.26.16 Patient is scheduled for CPX labs, please order future labs, Thanks , Karna Christmas

## 2015-03-09 ENCOUNTER — Other Ambulatory Visit (INDEPENDENT_AMBULATORY_CARE_PROVIDER_SITE_OTHER): Payer: BC Managed Care – PPO

## 2015-03-09 DIAGNOSIS — R7989 Other specified abnormal findings of blood chemistry: Secondary | ICD-10-CM | POA: Diagnosis not present

## 2015-03-09 DIAGNOSIS — E119 Type 2 diabetes mellitus without complications: Secondary | ICD-10-CM

## 2015-03-09 DIAGNOSIS — Z0000 Encounter for general adult medical examination without abnormal findings: Secondary | ICD-10-CM

## 2015-03-09 LAB — COMPREHENSIVE METABOLIC PANEL
ALBUMIN: 3.9 g/dL (ref 3.5–5.2)
ALK PHOS: 28 U/L — AB (ref 39–117)
ALT: 24 U/L (ref 0–35)
AST: 23 U/L (ref 0–37)
BILIRUBIN TOTAL: 0.3 mg/dL (ref 0.2–1.2)
BUN: 14 mg/dL (ref 6–23)
CALCIUM: 9.4 mg/dL (ref 8.4–10.5)
CO2: 28 mEq/L (ref 19–32)
CREATININE: 0.57 mg/dL (ref 0.40–1.20)
Chloride: 102 mEq/L (ref 96–112)
GFR: 113.45 mL/min (ref >60.00)
Glucose, Bld: 108 mg/dL — ABNORMAL HIGH (ref 70–99)
Potassium: 3.5 mEq/L (ref 3.5–5.1)
Sodium: 140 mEq/L (ref 135–145)
TOTAL PROTEIN: 7.1 g/dL (ref 6.0–8.3)

## 2015-03-09 LAB — CBC WITH DIFFERENTIAL/PLATELET
BASOS ABS: 0 10*3/uL (ref 0.0–0.1)
Basophils Relative: 0.6 % (ref 0.0–3.0)
EOS ABS: 0.2 10*3/uL (ref 0.0–0.7)
Eosinophils Relative: 2.7 % (ref 0.0–5.0)
HEMATOCRIT: 37.1 % (ref 36.0–46.0)
HEMOGLOBIN: 12.4 g/dL (ref 12.0–15.0)
LYMPHS PCT: 36.6 % (ref 12.0–46.0)
Lymphs Abs: 2.3 10*3/uL (ref 0.7–4.0)
MCHC: 33.5 g/dL (ref 30.0–36.0)
MCV: 87.3 fl (ref 78.0–100.0)
MONOS PCT: 7.9 % (ref 3.0–12.0)
Monocytes Absolute: 0.5 10*3/uL (ref 0.1–1.0)
NEUTROS ABS: 3.3 10*3/uL (ref 1.4–7.7)
Neutrophils Relative %: 52.2 % (ref 43.0–77.0)
Platelets: 408 10*3/uL — ABNORMAL HIGH (ref 150.0–400.0)
RBC: 4.24 Mil/uL (ref 3.87–5.11)
RDW: 13.6 % (ref 11.5–15.5)
WBC: 6.4 10*3/uL (ref 4.0–10.5)

## 2015-03-09 LAB — TSH: TSH: 0.98 u[IU]/mL (ref 0.35–4.50)

## 2015-03-09 LAB — LDL CHOLESTEROL, DIRECT: LDL DIRECT: 88 mg/dL

## 2015-03-09 LAB — LIPID PANEL
CHOL/HDL RATIO: 5
CHOLESTEROL: 159 mg/dL (ref 0–200)
HDL: 34.4 mg/dL — ABNORMAL LOW (ref >39.00)
NonHDL: 124.39
TRIGLYCERIDES: 310 mg/dL — AB (ref 0.0–149.0)
VLDL: 62 mg/dL — ABNORMAL HIGH (ref 0.0–40.0)

## 2015-03-09 LAB — HEMOGLOBIN A1C: HEMOGLOBIN A1C: 5.9 % (ref 4.6–6.5)

## 2015-03-13 ENCOUNTER — Ambulatory Visit (INDEPENDENT_AMBULATORY_CARE_PROVIDER_SITE_OTHER): Payer: BC Managed Care – PPO

## 2015-03-13 DIAGNOSIS — Z23 Encounter for immunization: Secondary | ICD-10-CM

## 2015-03-14 ENCOUNTER — Ambulatory Visit (INDEPENDENT_AMBULATORY_CARE_PROVIDER_SITE_OTHER): Payer: BC Managed Care – PPO | Admitting: Family Medicine

## 2015-03-14 ENCOUNTER — Encounter: Payer: Self-pay | Admitting: Family Medicine

## 2015-03-14 VITALS — BP 142/80 | HR 105 | Temp 98.4°F | Ht 59.5 in | Wt 161.5 lb

## 2015-03-14 DIAGNOSIS — I10 Essential (primary) hypertension: Secondary | ICD-10-CM | POA: Diagnosis not present

## 2015-03-14 DIAGNOSIS — Z0000 Encounter for general adult medical examination without abnormal findings: Secondary | ICD-10-CM

## 2015-03-14 DIAGNOSIS — E119 Type 2 diabetes mellitus without complications: Secondary | ICD-10-CM | POA: Diagnosis not present

## 2015-03-14 DIAGNOSIS — E785 Hyperlipidemia, unspecified: Secondary | ICD-10-CM | POA: Diagnosis not present

## 2015-03-14 MED ORDER — HYDROCHLOROTHIAZIDE 25 MG PO TABS: 25.0000 mg | ORAL_TABLET | Freq: Every day | ORAL | Status: DC

## 2015-03-14 NOTE — Assessment & Plan Note (Signed)
bp not quite at goal BP Readings from Last 1 Encounters:  03/14/15 142/80   Increase hctz to 25 mg daily -update if side eff or problems Given handout DASH diet  Disc lifstyle change with low sodium diet and exercise  F/u about 3 mo

## 2015-03-14 NOTE — Assessment & Plan Note (Signed)
Lab Results  Component Value Date   HGBA1C 5.9 03/09/2015   Improved with better diet habits  Enc exercise and wt loss  opthy utd F/u 3 mo

## 2015-03-14 NOTE — Patient Instructions (Signed)
Your blood pressure is not at goal  Do watch diet for salt/sodium  Increase your hydrochlorothiazide (hctz) to 25 mg once daily (double up on what you have and new px was sent for the 25 mg)  Stay active - exercise when you can  Work on weight loss  Watch fat in diet for triglycerides  Take care of yourself   Follow up in about 3 months to check blood pressure

## 2015-03-14 NOTE — Progress Notes (Signed)
Pre visit review using our clinic review tool, if applicable. No additional management support is needed unless otherwise documented below in the visit note. 

## 2015-03-14 NOTE — Assessment & Plan Note (Signed)
Lipids are fairly stable though triglycerides are inc with time (despite great DM control) Rev low fat diet Continue tricor Disc goals for lipids and reasons to control them Rev labs with pt Rev low sat fat diet in detail

## 2015-03-14 NOTE — Assessment & Plan Note (Signed)
Reviewed health habits including diet and exercise and skin cancer prevention Reviewed appropriate screening tests for age  Also reviewed health mt list, fam hx and immunization status , as well as social and family history   See HPI Labs reviewed  Pt will schedule her mammogram for sept (former breast cancer tx with lumpect and radiation)-no change in exam  Will work at better bp control

## 2015-03-14 NOTE — Progress Notes (Signed)
Subjective:    Patient ID: Martha Hamilton, female    DOB: Jul 17, 1950, 64 y.o.   MRN: [id number]  HPI Here for health maintenance exam and to review chronic medical problems    Doing well overall  She is caring for her mother - has dementia/ difficult at times  Used to it  Does not get very agitated   Wt is stable with bmi of 32  Hep C/HIV screening = declines/ not high risk   Mammogram 9/15- has not set it up yet  [facility] - and she pays for the 3D check  Hx of breast ca with lumpectomy in the past  Self exam-no changes   No gyn problems  No bleeding or discharge or problems    F/u shot 8/16- yesterday  Td 7/10  Colonoscopy 10/14 - with recall of 10 y / normal  Zoster vaccine 7/12 PNA 11/14  bp is up on first check  today - she was "rushing" to get here  No cp or palpitations or headaches or edema  No side effects to medicines  BP Readings from Last 3 Encounters:  03/14/15 156/86  02/21/15 136/74  12/20/14 140/80      DM2 Lab Results  Component Value Date   HGBA1C 5.9 03/09/2015  she is really working on diet / doing great - for her and her mother  More exercise as well  Improved from 6.5 Glipizide and metformin-no hypoglycemia  Last eye exam - in 3/16 - was ok   Cholesterol On tricor and diet  Lab Results  Component Value Date   CHOL 159 03/09/2015   CHOL 149 08/25/2014   CHOL 126 02/13/2014   Lab Results  Component Value Date   HDL 34.40* 03/09/2015   HDL 34.40* 08/25/2014   HDL 34.70* 02/13/2014   Lab Results  Component Value Date   LDLCALC 51 10/21/2011   [place] 48 04/29/2010   Lab Results  Component Value Date   TRIG 310.0* 03/09/2015   TRIG 296.0* 08/25/2014   TRIG 301.0* 02/13/2014   Lab Results  Component Value Date   CHOLHDL 5 03/09/2015   CHOLHDL 4 08/25/2014   CHOLHDL 4 02/13/2014   Lab Results  Component Value Date   LDLDIRECT 88.0 03/09/2015   LDLDIRECT 92.0 08/25/2014   LDLDIRECT 65.8 02/13/2014    overall stable  On fenofibrate 145 - watching triglycerides       Chemistry      Component Value Date/Time   NA 140 03/09/2015 0815   K 3.5 03/09/2015 0815   CL 102 03/09/2015 0815   CO2 28 03/09/2015 0815   BUN 14 03/09/2015 0815   CREATININE 0.57 03/09/2015 0815      Component Value Date/Time   CALCIUM 9.4 03/09/2015 0815   ALKPHOS 28* 03/09/2015 0815   AST 23 03/09/2015 0815   ALT 24 03/09/2015 0815   BILITOT 0.3 03/09/2015 0815     Lab Results  Component Value Date   WBC 6.4 03/09/2015   HGB 12.4 03/09/2015   HCT 37.1 03/09/2015   MCV 87.3 03/09/2015   PLT 408.0* 03/09/2015   still takes iron pills regularly    Lab Results  Component Value Date   TSH 0.98 03/09/2015     Patient Active Problem List   Diagnosis Date Noted  . Pain in joint, shoulder region 06/14/2013  . Anemia 02/09/2013  . Abnormal EKG 07/27/2012  . Preoperative clearance 07/27/2012  . Special screening for malignant neoplasms,  colon 04/29/2011  . Gynecological examination 04/29/2011  . Routine general medical examination at a health care facility 04/20/2011  . Right shoulder pain 11/18/2010  . OSTEOARTHRITIS, HANDS, BILATERAL 10/31/2008  . Diabetes type 2, controlled 07/10/2008  . Hyperlipidemia 01/26/2008  . Essential hypertension 01/26/2008  . ALLERGIC RHINITIS 12/07/2007   Past Medical History  Diagnosis Date  . Allergic rhinitis   . DM2 (diabetes mellitus, type 2)   . Hypertension   . Osteoarthritis     hands  . Obesity   . Breast cancer   . GERD (gastroesophageal reflux disease)   . Hx of cardiovascular stress test     a. Lex MV 2/14:  EF 69%, no ischemia  . Iron deficiency anemia    Past Surgical History  Procedure Laterality Date  . Gastric restriction surgery      for reflux  . Appendectomy  1976  . Breast lumpectomy Right     right breast, lumpectomy w/nodes  . Hysteroscopy w/d&c N/A 10/18/2012    Procedure: DILATATION AND CURETTAGE /HYSTEROSCOPY;  Surgeon: Mora Bellman, MD;  Location: [facility];  Service: Gynecology;  Laterality: N/A;  . Polypectomy N/A 10/18/2012    Procedure: POLYPECTOMY;  Surgeon: Mora Bellman, MD;  Location: [facility];  Service: Gynecology;  Laterality: N/A;   Social History  Substance Use Topics  . Smoking status: Never Smoker   . Smokeless tobacco: Never Used  . Alcohol Use: No   Family History  Problem Relation Age of Onset  . Diabetes Mother   . Heart disease Mother     Pacemaker, CHF  . Stroke Father   . CAD Father 22    Died with MI  . Esophageal cancer Brother   . Colon cancer Neg Hx   . Rectal cancer Neg Hx   . Stomach cancer Neg Hx   . Hypertension Mother   . Kidney cancer Mother   . Heart disease Sister   . Diabetes Sister    Allergies  Allergen Reactions  . Cholestyramine Other (See Comments)    REACTION: Muscles tightened up, drew up.  . Lipitor [Atorvastatin] Other (See Comments)    Muscle cramping  . Niacin Nausea And Vomiting  . Simvastatin     Muscular pain   Current Outpatient Prescriptions on File Prior to Visit  Medication Sig Dispense Refill  . ACCU-CHEK COMPACT PLUS test strip TEST BLOOD SUGAR ONCE DAILY FOR DIABETES MELLITUS 100 each 2  . ACCU-CHEK SOFTCLIX LANCETS lancets USE ONCE DAILY TO CHECK BLOOD SUGARS FOR DIABETES MELLITUS 200 each 11  . acetaminophen (TYLENOL) 500 MG tablet Take 500 mg by mouth every 6 (six) hours as needed for mild pain.     Marland Kitchen amitriptyline (ELAVIL) 25 MG tablet TAKE 1 TABLET AT BEDTIME 90 tablet 3  . aspirin 81 MG tablet Take 81 mg by mouth daily.    . Cholecalciferol (VITAMIN D) 1000 UNITS capsule Take 2,000 Units by mouth daily.     . diclofenac sodium (VOLTAREN) 1 % GEL Apply 1 application topically as needed. For arthritis in finger    . fenofibrate (TRICOR) 145 MG tablet TAKE 1 TABLET DAILY 90 tablet 3  . ferrous sulfate 325 (65 FE) MG EC tablet Take 1 tablet (325 mg total) by mouth 3 (three) times daily with meals. 90 tablet 6  . GLIPIZIDE XL 2.5 MG 24 hr  tablet TAKE 1 TABLET DAILY IN THE EVENING 90 tablet 0  . losartan (COZAAR) 100 MG tablet TAKE 1 TABLET DAILY  90 tablet 3  . meclizine (ANTIVERT) 25 MG tablet Take 1 tablet (25 mg total) by mouth 3 (three) times daily as needed. 90 tablet 0  . meloxicam (MOBIC) 15 MG tablet Take 1 tablet (15 mg total) by mouth daily as needed for pain. With food 30 tablet 0  . metFORMIN (GLUCOPHAGE) 1000 MG tablet TAKE 1 TABLET TWICE A DAY WITH MEALS 180 tablet 1  . Omega-3 Fatty Acids (FISH OIL) 1200 MG CAPS Take 1 capsule by mouth daily.    Marland Kitchen omeprazole (PRILOSEC) 20 MG capsule TAKE 1 CAPSULE DAILY 90 capsule 3  . OVER THE COUNTER MEDICATION Vitalizer Gold without Vitamin  K taking one group of supplement daily.     Marland Kitchen SALINE NASAL SPRAY NA Place 1 spray into the nose daily.      No current facility-administered medications on file prior to visit.    Review of Systems Review of Systems  Constitutional: Negative for fever, appetite change, fatigue and unexpected weight change.  Eyes: Negative for pain and visual disturbance.  Respiratory: Negative for cough and shortness of breath.   Cardiovascular: Negative for cp or palpitations    Gastrointestinal: Negative for nausea, diarrhea and constipation.  Genitourinary: Negative for urgency and frequency.  Skin: Negative for pallor or rash   Neurological: Negative for weakness, light-headedness, numbness and headaches.  Hematological: Negative for adenopathy. Does not bruise/bleed easily.  Psychiatric/Behavioral: Negative for dysphoric mood. The patient is not nervous/anxious.  some stress caring for mother        Objective:   Physical Exam  Constitutional: She appears well-developed and well-nourished. No distress.  obese and well appearing   HENT:  Head: Normocephalic and atraumatic.  Right Ear: External ear normal.  Left Ear: External ear normal.  Mouth/Throat: Oropharynx is clear and moist.  Eyes: Conjunctivae and EOM are normal. Pupils are equal,  round, and reactive to light. No scleral icterus.  Neck: Normal range of motion. Neck supple. No JVD present. Carotid bruit is not present. No thyromegaly present.  Cardiovascular: Normal rate, regular rhythm, normal heart sounds and intact distal pulses.  Exam reveals no gallop.   Pulmonary/Chest: Effort normal and breath sounds normal. No respiratory distress. She has no wheezes. She exhibits no tenderness.  Abdominal: Soft. Bowel sounds are normal. She exhibits no distension, no abdominal bruit and no mass. There is no tenderness.  Genitourinary: No breast swelling, tenderness, discharge or bleeding.  Baseline surgical changes in R breast and axilla   Breast exam: No mass, nodules, thickening, tenderness, bulging, retraction, inflamation, nipple discharge or skin changes noted.  No axillary or clavicular LA.      Musculoskeletal: Normal range of motion. She exhibits no edema or tenderness.  Lymphadenopathy:    She has no cervical adenopathy.  Neurological: She is alert. She has normal reflexes. No cranial nerve deficit. She exhibits normal muscle tone. Coordination normal.  Skin: Skin is warm and dry. No rash noted. No erythema. No pallor.  Fair complexion  Psychiatric: She has a normal mood and affect.          Assessment & Plan:   Problem List Items Addressed This Visit    Diabetes type 2, controlled    Lab Results  Component Value Date   HGBA1C 5.9 03/09/2015   Improved with better diet habits  Enc exercise and wt loss  opthy utd F/u 3 mo       Essential hypertension - Primary    bp not quite at goal BP Readings  from Last 1 Encounters:  03/14/15 142/80   Increase hctz to 25 mg daily -update if side eff or problems Given handout DASH diet  Disc lifstyle change with low sodium diet and exercise  F/u about 3 mo        Relevant Medications   hydrochlorothiazide (HYDRODIURIL) 25 MG tablet   Hyperlipidemia    Lipids are fairly stable though triglycerides are inc  with time (despite great DM control) Rev low fat diet Continue tricor Disc goals for lipids and reasons to control them Rev labs with pt Rev low sat fat diet in detail       Relevant Medications   hydrochlorothiazide (HYDRODIURIL) 25 MG tablet   Routine general medical examination at a health care facility    Reviewed health habits including diet and exercise and skin cancer prevention Reviewed appropriate screening tests for age  Also reviewed health mt list, fam hx and immunization status , as well as social and family history   See HPI Labs reviewed  Pt will schedule her mammogram for sept (former breast cancer tx with lumpect and radiation)-no change in exam  Will work at better bp control

## 2015-03-19 ENCOUNTER — Other Ambulatory Visit: Payer: Self-pay | Admitting: Family Medicine

## 2015-03-20 ENCOUNTER — Encounter: Payer: Self-pay | Admitting: Family Medicine

## 2015-03-20 ENCOUNTER — Ambulatory Visit (INDEPENDENT_AMBULATORY_CARE_PROVIDER_SITE_OTHER): Payer: BC Managed Care – PPO | Admitting: Family Medicine

## 2015-03-20 VITALS — BP 140/76 | HR 110 | Temp 98.7°F | Ht 59.5 in | Wt 160.2 lb

## 2015-03-20 DIAGNOSIS — J208 Acute bronchitis due to other specified organisms: Secondary | ICD-10-CM | POA: Diagnosis not present

## 2015-03-20 MED ORDER — BENZONATATE 200 MG PO CAPS: 200.0000 mg | ORAL_CAPSULE | Freq: Three times a day (TID) | ORAL | Status: DC | PRN

## 2015-03-20 MED ORDER — HYDROCODONE-HOMATROPINE 5-1.5 MG/5ML PO SYRP: 5.0000 mL | ORAL_SOLUTION | Freq: Three times a day (TID) | ORAL | Status: DC | PRN

## 2015-03-20 MED ORDER — ALBUTEROL SULFATE HFA 108 (90 BASE) MCG/ACT IN AERS: 2.0000 | INHALATION_SPRAY | RESPIRATORY_TRACT | Status: DC | PRN

## 2015-03-20 NOTE — Patient Instructions (Signed)
I think you have viral bronchitis  Drink fluids and rest  Update me if fever/ increased productive cough  Use hycodan with caution of sedation  Tessalon three times daily Albuterol inhaler as needed for wheezing  Update if not starting to improve in a week or if worsening

## 2015-03-20 NOTE — Progress Notes (Signed)
Pre visit review using our clinic review tool, if applicable. No additional management support is needed unless otherwise documented below in the visit note. 

## 2015-03-20 NOTE — Assessment & Plan Note (Signed)
With barky cough  For cough- hycodan with caution and also tessalon  Fluids and rest  Albuterol mdi prn (with inst in use-has used in the past) Update if not starting to improve in a week or if worsening  -esp if worse wheezing or if fever/ worse cough or sob

## 2015-03-20 NOTE — Progress Notes (Signed)
Subjective:    Patient ID: Martha Hamilton, female    DOB: Feb 22, 1951, 64 y.o.   MRN: [id number]  HPI Here with a cough and cold symptoms   Started Friday/ much worse on Sat "coughing every breath" Bought diabetic tussin and sugar free cough drops  Feels like there is mucous in her throat   No nasal symptoms  Throat is raw from coughing  L ear hurts occ when she coughs  Top of head hurts from coughing   A little wheezing- scant/at times -worse with change in temp or talking a lot  A little better at night if she is still   Low grade temp if any  No chills or sweats   Patient Active Problem List   Diagnosis Date Noted  . Pain in joint, shoulder region 06/14/2013  . Anemia 02/09/2013  . Abnormal EKG 07/27/2012  . Preoperative clearance 07/27/2012  . Special screening for malignant neoplasms, colon 04/29/2011  . Gynecological examination 04/29/2011  . Routine general medical examination at a health care facility 04/20/2011  . Right shoulder pain 11/18/2010  . OSTEOARTHRITIS, HANDS, BILATERAL 10/31/2008  . Diabetes type 2, controlled 07/10/2008  . Hyperlipidemia 01/26/2008  . Essential hypertension 01/26/2008  . ALLERGIC RHINITIS 12/07/2007   Past Medical History  Diagnosis Date  . Allergic rhinitis   . DM2 (diabetes mellitus, type 2)   . Hypertension   . Osteoarthritis     hands  . Obesity   . Breast cancer   . GERD (gastroesophageal reflux disease)   . Hx of cardiovascular stress test     a. Lex MV 2/14:  EF 69%, no ischemia  . Iron deficiency anemia    Past Surgical History  Procedure Laterality Date  . Gastric restriction surgery      for reflux  . Appendectomy  1976  . Breast lumpectomy Right     right breast, lumpectomy w/nodes  . Hysteroscopy w/d&c N/A 10/18/2012    Procedure: DILATATION AND CURETTAGE /HYSTEROSCOPY;  Surgeon: Mora Bellman, MD;  Location: [facility];  Service: Gynecology;  Laterality: N/A;  . Polypectomy N/A 10/18/2012    Procedure: POLYPECTOMY;   Surgeon: Mora Bellman, MD;  Location: [facility];  Service: Gynecology;  Laterality: N/A;   Social History  Substance Use Topics  . Smoking status: Never Smoker   . Smokeless tobacco: Never Used  . Alcohol Use: No   Family History  Problem Relation Age of Onset  . Diabetes Mother   . Heart disease Mother     Pacemaker, CHF  . Stroke Father   . CAD Father 38    Died with MI  . Esophageal cancer Brother   . Colon cancer Neg Hx   . Rectal cancer Neg Hx   . Stomach cancer Neg Hx   . Hypertension Mother   . Kidney cancer Mother   . Heart disease Sister   . Diabetes Sister    Allergies  Allergen Reactions  . Cholestyramine Other (See Comments)    REACTION: Muscles tightened up, drew up.  . Lipitor [Atorvastatin] Other (See Comments)    Muscle cramping  . Niacin Nausea And Vomiting  . Simvastatin     Muscular pain   Current Outpatient Prescriptions on File Prior to Visit  Medication Sig Dispense Refill  . ACCU-CHEK COMPACT PLUS test strip TEST BLOOD SUGAR ONCE DAILY FOR DIABETES MELLITUS 100 each 2  . ACCU-CHEK SOFTCLIX LANCETS lancets USE ONCE DAILY TO CHECK BLOOD SUGARS FOR DIABETES MELLITUS 200 each  11  . acetaminophen (TYLENOL) 500 MG tablet Take 500 mg by mouth every 6 (six) hours as needed for mild pain.     Marland Kitchen amitriptyline (ELAVIL) 25 MG tablet TAKE 1 TABLET AT BEDTIME 90 tablet 3  . aspirin 81 MG tablet Take 81 mg by mouth daily.    . Cholecalciferol (VITAMIN D) 1000 UNITS capsule Take 2,000 Units by mouth daily.     . diclofenac sodium (VOLTAREN) 1 % GEL Apply 1 application topically as needed. For arthritis in finger    . fenofibrate (TRICOR) 145 MG tablet TAKE 1 TABLET DAILY 90 tablet 3  . ferrous sulfate 325 (65 FE) MG EC tablet Take 1 tablet (325 mg total) by mouth 3 (three) times daily with meals. 90 tablet 6  . GLIPIZIDE XL 2.5 MG 24 hr tablet TAKE 1 TABLET DAILY IN THE EVENING 90 tablet 0  . hydrochlorothiazide (HYDRODIURIL) 25 MG tablet Take 1 tablet (25 mg  total) by mouth daily. 90 tablet 3  . losartan (COZAAR) 100 MG tablet TAKE 1 TABLET DAILY 90 tablet 3  . meclizine (ANTIVERT) 25 MG tablet Take 1 tablet (25 mg total) by mouth 3 (three) times daily as needed. 90 tablet 0  . metFORMIN (GLUCOPHAGE) 1000 MG tablet TAKE 1 TABLET TWICE A DAY WITH MEALS 180 tablet 1  . Omega-3 Fatty Acids (FISH OIL) 1200 MG CAPS Take 1 capsule by mouth daily.    Marland Kitchen omeprazole (PRILOSEC) 20 MG capsule TAKE 1 CAPSULE DAILY 90 capsule 3  . OVER THE COUNTER MEDICATION Vitalizer Gold without Vitamin  K taking one group of supplement daily.     Marland Kitchen SALINE NASAL SPRAY NA Place 1 spray into the nose daily.      No current facility-administered medications on file prior to visit.      Review of Systems Review of Systems  Constitutional: Negative for fever, appetite change, and unexpected weight change.  ENT neg for congestion/ rhinorrhea or sinus pain  Eyes: Negative for pain and visual disturbance.  Respiratory: Negative for  shortness of breath.  pos for cough /tight breathing/wheeze  Cardiovascular: Negative for cp or palpitations    Gastrointestinal: Negative for nausea, diarrhea and constipation.  Genitourinary: Negative for urgency and frequency.  Skin: Negative for pallor or rash   Neurological: Negative for weakness, light-headedness, numbness and headaches.  Hematological: Negative for adenopathy. Does not bruise/bleed easily.  Psychiatric/Behavioral: Negative for dysphoric mood. The patient is not nervous/anxious.         Objective:   Physical Exam  Constitutional: She appears well-developed and well-nourished. No distress.  HENT:  Head: Normocephalic and atraumatic.  Mouth/Throat: Oropharynx is clear and moist.  Boggy nares with scant clear post nasal drip   No sinus tenderness   Eyes: Conjunctivae and EOM are normal. Pupils are equal, round, and reactive to light.  Neck: Normal range of motion. Neck supple. No JVD present. Carotid bruit is not  present. No thyromegaly present.  Cardiovascular: Normal rate, regular rhythm, normal heart sounds and intact distal pulses.  Exam reveals no gallop.   Pulmonary/Chest: Effort normal and breath sounds normal. No respiratory distress. She has no wheezes. She has no rales. She exhibits tenderness.  No crackles  Harsh bs with good air exch  Barking cough  No rales   Abdominal: Soft. Bowel sounds are normal. She exhibits no distension, no abdominal bruit and no mass. There is no tenderness.  Musculoskeletal: She exhibits no edema.  Lymphadenopathy:    She has no  cervical adenopathy.  Neurological: She is alert. She has normal reflexes.  Skin: Skin is warm and dry. No rash noted.  Psychiatric: She has a normal mood and affect.          Assessment & Plan:   Problem List Items Addressed This Visit      Respiratory   Acute viral bronchitis - Primary    With barky cough  For cough- hycodan with caution and also tessalon  Fluids and rest  Albuterol mdi prn (with inst in use-has used in the past) Update if not starting to improve in a week or if worsening  -esp if worse wheezing or if fever/ worse cough or sob

## 2015-03-27 ENCOUNTER — Telehealth: Payer: Self-pay | Admitting: Cardiology

## 2015-03-27 NOTE — Telephone Encounter (Signed)
Close encounter 

## 2015-04-02 ENCOUNTER — Other Ambulatory Visit: Payer: Self-pay

## 2015-04-02 DIAGNOSIS — Z853 Personal history of malignant neoplasm of breast: Secondary | ICD-10-CM

## 2015-04-02 DIAGNOSIS — Z1231 Encounter for screening mammogram for malignant neoplasm of breast: Secondary | ICD-10-CM

## 2015-04-26 ENCOUNTER — Ambulatory Visit
Admission: RE | Admit: 2015-04-26 | Discharge: 2015-04-26 | Disposition: A | Discharge status: Home / Self Care | Payer: BC Managed Care – PPO | Source: Ambulatory Visit

## 2015-04-26 DIAGNOSIS — Z1231 Encounter for screening mammogram for malignant neoplasm of breast: Secondary | ICD-10-CM

## 2015-04-26 DIAGNOSIS — Z853 Personal history of malignant neoplasm of breast: Secondary | ICD-10-CM

## 2015-04-28 ENCOUNTER — Other Ambulatory Visit: Payer: Self-pay | Admitting: Family Medicine

## 2015-05-08 ENCOUNTER — Other Ambulatory Visit: Payer: Self-pay | Admitting: Family Medicine

## 2015-05-10 ENCOUNTER — Ambulatory Visit (INDEPENDENT_AMBULATORY_CARE_PROVIDER_SITE_OTHER): Payer: BC Managed Care – PPO | Admitting: Cardiology

## 2015-05-10 ENCOUNTER — Encounter: Payer: Self-pay | Admitting: Cardiology

## 2015-05-10 VITALS — BP 124/68 | HR 106 | Ht 59.5 in | Wt 160.0 lb

## 2015-05-10 DIAGNOSIS — I10 Essential (primary) hypertension: Secondary | ICD-10-CM

## 2015-05-10 NOTE — Patient Instructions (Signed)
Your physician recommends that you schedule a follow-up appointment in: As Needed    

## 2015-05-10 NOTE — Progress Notes (Signed)
HPI I saw the patient presented two years ago for preoperative evaluation prior to a D&C. She has not had any prior cardiac history but she does have risk factors. She had a [employer].  This demonstrated no ischemia or infarct with a normal EF.  Since I last saw her she has done well.  The patient denies any new symptoms such as chest discomfort, neck or arm discomfort. There has been no new shortness of breath, PND or orthopnea. There have been no reported palpitations, presyncope or syncope.  She exercises routinely and takes care of her aging mother.  She does have a baseline sinus tachycardia or resting heart rate in the 90s.   Allergies  Allergen Reactions  . Cholestyramine Other (See Comments)    REACTION: Muscles tightened up, drew up.  . Lipitor [Atorvastatin] Other (See Comments)    Muscle cramping  . Niacin Nausea And Vomiting  . Simvastatin     Muscular pain    Current Outpatient Prescriptions  Medication Sig Dispense Refill  . ACCU-CHEK SOFTCLIX LANCETS lancets USE ONCE DAILY TO CHECK BLOOD SUGARS FOR DIABETES MELLITUS 200 each 11  . acetaminophen (TYLENOL) 500 MG tablet Take 500 mg by mouth every 6 (six) hours as needed for mild pain.     Marland Kitchen amitriptyline (ELAVIL) 25 MG tablet TAKE 1 TABLET AT BEDTIME 90 tablet 3  . aspirin 81 MG tablet Take 81 mg by mouth daily.    . Cholecalciferol (VITAMIN D) 1000 UNITS capsule Take 2,000 Units by mouth daily.     . fenofibrate (TRICOR) 145 MG tablet TAKE 1 TABLET DAILY 90 tablet 1  . ferrous sulfate 325 (65 FE) MG EC tablet Take 1 tablet (325 mg total) by mouth 3 (three) times daily with meals. 90 tablet 6  . GLIPIZIDE XL 2.5 MG 24 hr tablet TAKE 1 TABLET DAILY IN THE EVENING 90 tablet 3  . glucose blood (ACCU-CHEK COMPACT PLUS) test strip TEST BLOOD SUGAR ONCE DAILY FOR DIABETES MELLITUS AS AS NEEDED (DX. E11.9) 100 each 1  . hydrochlorothiazide (HYDRODIURIL) 25 MG tablet Take 1 tablet (25 mg total) by mouth daily. 90 tablet 3    . HYDROcodone-homatropine (HYCODAN) 5-1.5 MG/5ML syrup Take 5 mLs by mouth every 8 (eight) hours as needed for cough. 120 mL 0  . losartan (COZAAR) 100 MG tablet TAKE 1 TABLET DAILY 90 tablet 3  . meclizine (ANTIVERT) 25 MG tablet Take 1 tablet (25 mg total) by mouth 3 (three) times daily as needed. 90 tablet 0  . metFORMIN (GLUCOPHAGE) 1000 MG tablet TAKE 1 TABLET TWICE A DAY WITH MEALS 180 tablet 1  . Omega-3 Fatty Acids (FISH OIL) 1200 MG CAPS Take 1 capsule by mouth daily.    Marland Kitchen omeprazole (PRILOSEC) 20 MG capsule TAKE 1 CAPSULE DAILY 90 capsule 3  . OVER THE COUNTER MEDICATION Vitalizer Gold without Vitamin  K taking one group of supplement daily.     Marland Kitchen SALINE NASAL SPRAY NA Place 1 spray into the nose daily.      No current facility-administered medications for this visit.    Past Medical History  Diagnosis Date  . Allergic rhinitis   . DM2 (diabetes mellitus, type 2) ([place])   . Hypertension   . Osteoarthritis     hands  . Obesity   . Breast cancer ([place])   . GERD (gastroesophageal reflux disease)   . Hx of cardiovascular stress test     a. Lex MV 2/14:  EF 69%,  no ischemia  . Iron deficiency anemia     Past Surgical History  Procedure Laterality Date  . Gastric restriction surgery      for reflux  . Appendectomy  1976  . Breast lumpectomy Right     right breast, lumpectomy w/nodes  . Hysteroscopy w/d&c N/A 10/18/2012    Procedure: DILATATION AND CURETTAGE /HYSTEROSCOPY;  Surgeon: Mora Bellman, MD;  Location: [facility];  Service: Gynecology;  Laterality: N/A;  . Polypectomy N/A 10/18/2012    Procedure: POLYPECTOMY;  Surgeon: Mora Bellman, MD;  Location: [facility];  Service: Gynecology;  Laterality: N/A;   ROS:  As stated in the HPI and negative for all other systems.  PHYSICAL EXAM BP 124/68 mmHg  Pulse 106  Ht 4' 11.5" (1.511 m)  Wt 160 lb (72.576 kg)  BMI 31.79 kg/m2  LMP 06/13/2001 GENERAL:  Well appearing NECK:  No jugular venous distention, waveform within normal  limits, carotid upstroke brisk and symmetric, no bruits, no thyromegaly LUNGS:  Clear to auscultation bilaterally BACK:  No CVA tenderness CHEST:  Unremarkable HEART:  PMI not displaced or sustained,S1 and S2 within normal limits, no S3, no S4, no clicks, no rubs, no murmurs ABD:  Flat, positive bowel sounds normal in frequency in pitch, no bruits, no rebound, no guarding, no midline pulsatile mass, no hepatomegaly, no splenomegaly EXT:  2 plus pulses throughout, no edema, no cyanosis no clubbing  EKG:  Sinus rhythm, rate 107, axis within normal limits, intervals within normal limits, RSR prime V1 and V2, no acute ST-T wave changes. 05/10/2015  ASSESSMENT AND PLAN  Hypertension Her blood pressure is at target.  Her HCTZ was just increased.  She will continue the meds as listed.   Hypertriglyceridemia She will continue with diabetes control.  Her A1C was 5.9

## 2015-06-18 ENCOUNTER — Telehealth: Payer: Self-pay

## 2015-06-18 ENCOUNTER — Ambulatory Visit (INDEPENDENT_AMBULATORY_CARE_PROVIDER_SITE_OTHER): Payer: BC Managed Care – PPO | Admitting: Family Medicine

## 2015-06-18 ENCOUNTER — Encounter: Payer: Self-pay | Admitting: Family Medicine

## 2015-06-18 VITALS — BP 120/70 | HR 103 | Temp 98.0°F | Ht 59.5 in | Wt 162.0 lb

## 2015-06-18 DIAGNOSIS — E785 Hyperlipidemia, unspecified: Secondary | ICD-10-CM

## 2015-06-18 DIAGNOSIS — I10 Essential (primary) hypertension: Secondary | ICD-10-CM | POA: Diagnosis not present

## 2015-06-18 NOTE — Progress Notes (Signed)
Pre visit review using our clinic review tool, if applicable. No additional management support is needed unless otherwise documented below in the visit note. 

## 2015-06-18 NOTE — Progress Notes (Signed)
Subjective:    Patient ID: Martha Hamilton, female    DOB: March 03, 1951, 64 y.o.   MRN: [id number]  HPI Here for f/u of chronic medical problems  Wt is up 2 lb with bmi of 32   bp is stable today - re check 120/70 No cp or palpitations or headaches or edema  No side effects to medicines  BP Readings from Last 3 Encounters:  06/18/15 140/88  05/10/15 124/68  03/20/15 140/76     Her insurance will change on Jan 1   [facility]   Her accu check test comp plus will change   tricor 145 no longer covered Lab Results  Component Value Date   CHOL 159 03/09/2015   HDL 34.40* 03/09/2015   LDLCALC 51 10/21/2011   LDLDIRECT 88.0 03/09/2015   TRIG 310.0* 03/09/2015   CHOLHDL 5 03/09/2015     Patient Active Problem List   Diagnosis Date Noted  . Acute viral bronchitis 03/20/2015  . Pain in joint, shoulder region 06/14/2013  . Anemia 02/09/2013  . Abnormal EKG 07/27/2012  . Preoperative clearance 07/27/2012  . Special screening for malignant neoplasms, colon 04/29/2011  . Gynecological examination 04/29/2011  . Routine general medical examination at a health care facility 04/20/2011  . Right shoulder pain 11/18/2010  . OSTEOARTHRITIS, HANDS, BILATERAL 10/31/2008  . Diabetes type 2, controlled ([place]) 07/10/2008  . Hyperlipidemia 01/26/2008  . Essential hypertension 01/26/2008  . ALLERGIC RHINITIS 12/07/2007   Past Medical History  Diagnosis Date  . Allergic rhinitis   . DM2 (diabetes mellitus, type 2) ([place])   . Hypertension   . Osteoarthritis     hands  . Obesity   . Breast cancer ([place])   . GERD (gastroesophageal reflux disease)   . Hx of cardiovascular stress test     a. Lex MV 2/14:  EF 69%, no ischemia  . Iron deficiency anemia    Past Surgical History  Procedure Laterality Date  . Gastric restriction surgery      for reflux  . Appendectomy  1976  . Breast lumpectomy Right     right breast, lumpectomy w/nodes  . Hysteroscopy w/d&c N/A  10/18/2012    Procedure: DILATATION AND CURETTAGE /HYSTEROSCOPY;  Surgeon: Mora Bellman, MD;  Location: [facility];  Service: Gynecology;  Laterality: N/A;  . Polypectomy N/A 10/18/2012    Procedure: POLYPECTOMY;  Surgeon: Mora Bellman, MD;  Location: [facility];  Service: Gynecology;  Laterality: N/A;   Social History  Substance Use Topics  . Smoking status: Never Smoker   . Smokeless tobacco: Never Used  . Alcohol Use: None   Family History  Problem Relation Age of Onset  . Diabetes Mother   . Heart disease Mother     Pacemaker, CHF  . Stroke Father   . CAD Father 57    Died with MI  . Esophageal cancer Brother   . Colon cancer Neg Hx   . Rectal cancer Neg Hx   . Stomach cancer Neg Hx   . Hypertension Mother   . Kidney cancer Mother   . Heart disease Sister   . Diabetes Sister    Allergies  Allergen Reactions  . Cholestyramine Other (See Comments)    REACTION: Muscles tightened up, drew up.  . Lipitor [Atorvastatin] Other (See Comments)    Muscle cramping  . Niacin Nausea And Vomiting  . Simvastatin     Muscular pain   Current Outpatient Prescriptions on File Prior to  Visit  Medication Sig Dispense Refill  . ACCU-CHEK SOFTCLIX LANCETS lancets USE ONCE DAILY TO CHECK BLOOD SUGARS FOR DIABETES MELLITUS 200 each 11  . acetaminophen (TYLENOL) 500 MG tablet Take 500 mg by mouth every 6 (six) hours as needed for mild pain.     Marland Kitchen amitriptyline (ELAVIL) 25 MG tablet TAKE 1 TABLET AT BEDTIME 90 tablet 3  . aspirin 81 MG tablet Take 81 mg by mouth daily.    . Cholecalciferol (VITAMIN D) 1000 UNITS capsule Take 2,000 Units by mouth daily.     . fenofibrate (TRICOR) 145 MG tablet TAKE 1 TABLET DAILY 90 tablet 1  . ferrous sulfate 325 (65 FE) MG EC tablet Take 1 tablet (325 mg total) by mouth 3 (three) times daily with meals. 90 tablet 6  . GLIPIZIDE XL 2.5 MG 24 hr tablet TAKE 1 TABLET DAILY IN THE EVENING 90 tablet 3  . glucose blood (ACCU-CHEK COMPACT PLUS) test strip TEST BLOOD SUGAR  ONCE DAILY FOR DIABETES MELLITUS AS AS NEEDED (DX. E11.9) 100 each 1  . hydrochlorothiazide (HYDRODIURIL) 25 MG tablet Take 1 tablet (25 mg total) by mouth daily. 90 tablet 3  . losartan (COZAAR) 100 MG tablet TAKE 1 TABLET DAILY 90 tablet 3  . meclizine (ANTIVERT) 25 MG tablet Take 1 tablet (25 mg total) by mouth 3 (three) times daily as needed. 90 tablet 0  . metFORMIN (GLUCOPHAGE) 1000 MG tablet TAKE 1 TABLET TWICE A DAY WITH MEALS 180 tablet 1  . Omega-3 Fatty Acids (FISH OIL) 1200 MG CAPS Take 1 capsule by mouth daily.    Marland Kitchen omeprazole (PRILOSEC) 20 MG capsule TAKE 1 CAPSULE DAILY 90 capsule 3  . OVER THE COUNTER MEDICATION Vitalizer Gold without Vitamin  K taking one group of supplement daily.     Marland Kitchen SALINE NASAL SPRAY NA Place 1 spray into the nose daily.      No current facility-administered medications on file prior to visit.    Review of Systems    Review of Systems  Constitutional: Negative for fever, appetite change, fatigue and unexpected weight change.  Eyes: Negative for pain and visual disturbance.  Respiratory: Negative for cough and shortness of breath.   Cardiovascular: Negative for cp or palpitations    Gastrointestinal: Negative for nausea, diarrhea and constipation.  Genitourinary: Negative for urgency and frequency.  Skin: Negative for pallor or rash   Neurological: Negative for weakness, light-headedness, numbness and headaches.  Hematological: Negative for adenopathy. Does not bruise/bleed easily.  Psychiatric/Behavioral: Negative for dysphoric mood. The patient is not nervous/anxious.  pos for caregiver stress/ but thinks she is doing ok     Objective:   Physical Exam  Constitutional: She appears well-developed and well-nourished. No distress.  obese and well appearing   HENT:  Head: Normocephalic and atraumatic.  Mouth/Throat: Oropharynx is clear and moist.  Eyes: Conjunctivae and EOM are normal. Pupils are equal, round, and reactive to light.  Neck: Normal  range of motion. Neck supple. No JVD present. Carotid bruit is not present. No thyromegaly present.  Cardiovascular: Normal rate, regular rhythm, normal heart sounds and intact distal pulses.  Exam reveals no gallop.   Pulmonary/Chest: Effort normal and breath sounds normal. No respiratory distress. She has no wheezes. She has no rales.  No crackles  Abdominal: Soft. Bowel sounds are normal. She exhibits no distension, no abdominal bruit and no mass. There is no tenderness.  Musculoskeletal: She exhibits tenderness. She exhibits no edema.  Lymphadenopathy:    She  has no cervical adenopathy.  Neurological: She is alert. She has normal reflexes.  Skin: Skin is warm and dry. No rash noted.  Psychiatric: She has a normal mood and affect.          Assessment & Plan:

## 2015-06-18 NOTE — Assessment & Plan Note (Signed)
Pt will have a formulary change with her ins on Jan 1 tricor will not be covered She will get [place] a list of what is so change can be made Rev last chol  Disc low sat fat diet and sugar control for trig

## 2015-06-18 NOTE — Telephone Encounter (Signed)
-----   Message from Algernon Huxley, RN sent at 12/19/2014 10:53 AM EDT ----- Regarding: CBC Pt needs CBC in December

## 2015-06-18 NOTE — Patient Instructions (Addendum)
Get a complete list of what will be covered for you  I need the exact name of test strips and lancets (and machine if needed)  Also which fibrate (triglyceride medicine) and dosage is covered   Then we will get a px going for you bp is good today  Take care of yourself !

## 2015-06-18 NOTE — Assessment & Plan Note (Signed)
bp in fair control at this time  BP Readings from Last 1 Encounters:  06/18/15 120/70   No changes needed Disc lifstyle change with low sodium diet and exercise  Labs reviewed

## 2015-06-18 NOTE — Telephone Encounter (Signed)
Pt aware.

## 2015-06-19 ENCOUNTER — Other Ambulatory Visit (INDEPENDENT_AMBULATORY_CARE_PROVIDER_SITE_OTHER): Payer: BC Managed Care – PPO

## 2015-06-19 DIAGNOSIS — D509 Iron deficiency anemia, unspecified: Secondary | ICD-10-CM | POA: Diagnosis not present

## 2015-06-19 LAB — CBC WITH DIFFERENTIAL/PLATELET
BASOS ABS: 0 10*3/uL (ref 0.0–0.1)
Basophils Relative: 0.6 % (ref 0.0–3.0)
EOS ABS: 0.2 10*3/uL (ref 0.0–0.7)
Eosinophils Relative: 3.1 % (ref 0.0–5.0)
HEMATOCRIT: 38.1 % (ref 36.0–46.0)
HEMOGLOBIN: 12.7 g/dL (ref 12.0–15.0)
LYMPHS PCT: 39.2 % (ref 12.0–46.0)
Lymphs Abs: 2.4 10*3/uL (ref 0.7–4.0)
MCHC: 33.2 g/dL (ref 30.0–36.0)
MCV: 87.1 fl (ref 78.0–100.0)
Monocytes Absolute: 0.4 10*3/uL (ref 0.1–1.0)
Monocytes Relative: 6.5 % (ref 3.0–12.0)
NEUTROS ABS: 3.1 10*3/uL (ref 1.4–7.7)
Neutrophils Relative %: 50.6 % (ref 43.0–77.0)
Platelets: 429 10*3/uL — ABNORMAL HIGH (ref 150.0–400.0)
RBC: 4.38 Mil/uL (ref 3.87–5.11)
RDW: 13.4 % (ref 11.5–15.5)
WBC: 6 10*3/uL (ref 4.0–10.5)

## 2015-07-19 ENCOUNTER — Other Ambulatory Visit: Payer: Self-pay

## 2015-07-19 MED ORDER — LANCETS 30G MISC: Status: DC

## 2015-07-19 MED ORDER — GLUCOSE BLOOD VI STRP: ORAL_STRIP | Status: DC

## 2015-07-23 ENCOUNTER — Other Ambulatory Visit: Payer: Self-pay | Admitting: Family Medicine

## 2015-07-23 ENCOUNTER — Telehealth: Payer: Self-pay

## 2015-07-23 MED ORDER — ONETOUCH DELICA LANCETS 33G MISC: Status: DC

## 2015-07-23 NOTE — Telephone Encounter (Signed)
[facility] touch delica 33 G extra fine lancets. Advised OK to change.

## 2015-08-21 ENCOUNTER — Other Ambulatory Visit: Payer: Self-pay | Admitting: Family Medicine

## 2015-09-14 ENCOUNTER — Other Ambulatory Visit: Payer: Self-pay | Admitting: Family Medicine

## 2015-09-14 NOTE — Telephone Encounter (Signed)
done

## 2015-09-14 NOTE — Telephone Encounter (Signed)
pts last labs 02/2015- abnormal. pls advise

## 2015-09-14 NOTE — Telephone Encounter (Signed)
Aware-thanks Please refill for a year

## 2015-10-07 ENCOUNTER — Other Ambulatory Visit: Payer: Self-pay | Admitting: Family Medicine

## 2015-10-30 LAB — HM DIABETES EYE EXAM

## 2015-11-01 ENCOUNTER — Other Ambulatory Visit: Payer: Self-pay | Admitting: *Deleted

## 2015-11-01 MED ORDER — METFORMIN HCL 1000 MG PO TABS: 1000.0000 mg | ORAL_TABLET | Freq: Two times a day (BID) | ORAL | Status: DC

## 2015-11-01 NOTE — Telephone Encounter (Signed)
Pt has new mail order pharmacy, Rx sent to new pharmacy

## 2015-12-29 ENCOUNTER — Other Ambulatory Visit: Payer: Self-pay | Admitting: Family Medicine

## 2016-01-01 ENCOUNTER — Other Ambulatory Visit: Payer: Self-pay | Admitting: *Deleted

## 2016-01-01 MED ORDER — LOSARTAN POTASSIUM 100 MG PO TABS: 100.0000 mg | ORAL_TABLET | Freq: Every day | ORAL | Status: DC

## 2016-01-09 ENCOUNTER — Other Ambulatory Visit: Payer: Self-pay

## 2016-01-09 MED ORDER — AMITRIPTYLINE HCL 25 MG PO TABS: 25.0000 mg | ORAL_TABLET | Freq: Every day | ORAL | Status: DC

## 2016-01-09 NOTE — Telephone Encounter (Signed)
Pt ins has changed and now using [facility] (acct already set up). Refill done as requested and pt voiced understanding.

## 2016-01-16 ENCOUNTER — Other Ambulatory Visit: Payer: Self-pay

## 2016-01-16 MED ORDER — OMEPRAZOLE 20 MG PO CPDR: 20.0000 mg | DELAYED_RELEASE_CAPSULE | Freq: Every day | ORAL | Status: DC

## 2016-01-17 ENCOUNTER — Telehealth: Payer: Self-pay | Admitting: *Deleted

## 2016-01-17 MED ORDER — METFORMIN HCL 1000 MG PO TABS: 1000.0000 mg | ORAL_TABLET | Freq: Two times a day (BID) | ORAL | Status: DC

## 2016-01-17 MED ORDER — HYDROCHLOROTHIAZIDE 25 MG PO TABS: 25.0000 mg | ORAL_TABLET | Freq: Every day | ORAL | Status: DC

## 2016-01-17 MED ORDER — FENOFIBRATE 145 MG PO TABS: 145.0000 mg | ORAL_TABLET | Freq: Every day | ORAL | Status: DC

## 2016-01-17 MED ORDER — AMITRIPTYLINE HCL 25 MG PO TABS: 25.0000 mg | ORAL_TABLET | Freq: Every day | ORAL | Status: DC

## 2016-01-17 MED ORDER — ONETOUCH DELICA LANCETS 33G MISC: Status: DC

## 2016-01-17 MED ORDER — GLIPIZIDE ER 2.5 MG PO TB24: 2.5000 mg | ORAL_TABLET | Freq: Every evening | ORAL | Status: DC

## 2016-01-17 MED ORDER — OMEPRAZOLE 20 MG PO CPDR: 20.0000 mg | DELAYED_RELEASE_CAPSULE | Freq: Every day | ORAL | Status: DC

## 2016-01-17 MED ORDER — LOSARTAN POTASSIUM 100 MG PO TABS: 100.0000 mg | ORAL_TABLET | Freq: Every day | ORAL | Status: DC

## 2016-01-17 MED ORDER — GLUCOSE BLOOD VI STRP: ORAL_STRIP | Status: DC

## 2016-01-17 NOTE — Telephone Encounter (Signed)
Pt advise me that she is turning 65 this month and medicare is making her change to their preferred pharmacy, Rxs sent to [facility], and she is no longer using [facility]

## 2016-01-27 ENCOUNTER — Other Ambulatory Visit: Payer: Self-pay | Admitting: Family Medicine

## 2016-01-28 NOTE — Telephone Encounter (Signed)
Refills sent to pharmacy as instructed. 

## 2016-01-28 NOTE — Telephone Encounter (Addendum)
Received refill request from [facility]. Spoke to patient and was advised that she has changed her mail order pharmacy to [facility] and would like refills sent back to them. See office visit from 06/18/15. Okay to refill as requested from pharmacy? Amitriptyline- okay to refill and how many?

## 2016-01-28 NOTE — Telephone Encounter (Signed)
Please refill all for a year  amitriptyline 90 with 3 ref

## 2016-02-08 ENCOUNTER — Ambulatory Visit (INDEPENDENT_AMBULATORY_CARE_PROVIDER_SITE_OTHER): Payer: Medicare Other | Admitting: Family Medicine

## 2016-02-08 ENCOUNTER — Encounter: Payer: Self-pay | Admitting: Family Medicine

## 2016-02-08 VITALS — BP 116/64 | HR 98 | Temp 98.0°F | Ht 59.5 in | Wt 155.5 lb

## 2016-02-08 DIAGNOSIS — Z23 Encounter for immunization: Secondary | ICD-10-CM

## 2016-02-08 DIAGNOSIS — M791 Myalgia, unspecified site: Secondary | ICD-10-CM | POA: Insufficient documentation

## 2016-02-08 LAB — SEDIMENTATION RATE: Sed Rate: 6 mm/hr (ref 0–30)

## 2016-02-08 MED ORDER — PNEUMOCOCCAL 13-VAL CONJ VACC IM SUSP
0.5000 mL | INTRAMUSCULAR | Status: AC
  Administered 2016-02-08: 0.5 mL via INTRAMUSCULAR

## 2016-02-08 NOTE — Progress Notes (Signed)
Pre visit review using our clinic review tool, if applicable. No additional management support is needed unless otherwise documented below in the visit note. 

## 2016-02-08 NOTE — Patient Instructions (Addendum)
Hold your fenofibrate (cholesterol medicine) for 2 weeks  This may be causing your pain  Let me know in about 2 weeks whether this helped   Also - lab today to rule out polymyalgia (PMR)- because of the stiffness and pain  Will treat if it looks positive   Then we will go from there   You can try using heat to relax muscles as needed 10 minutes at a time   prevnar vaccine today

## 2016-02-08 NOTE — Progress Notes (Signed)
Subjective:    Patient ID: Martha Hamilton, female    DOB: 10-18-1950, 65 y.o.   MRN: [id number]  HPI Here for leg and hand pain  Both legs and both hands   Feels like muscles hurt (not joints)  Going on at least 3 weeks    Worse in R hand and L leg  She uses R hand more and lies on L side   No new rep motion or activity  Takes care of her mother    Hurts to grip or lift or peel potatoes -a bother to her  Hurts in palms -esp thumb area  Also in her calves (little in shins)   Perhaps a bit of swelling in R hand -no where else   No change in medicines  No change in shoes    Wt Readings from Last 3 Encounters:  02/08/16 155 lb 8 oz (70.5 kg)  06/18/15 162 lb (73.5 kg)  05/10/15 160 lb (72.6 kg)   Patient Active Problem List   Diagnosis Date Noted  . Muscle pain 02/08/2016  . Acute viral bronchitis 03/20/2015  . Pain in joint, shoulder region 06/14/2013  . Anemia 02/09/2013  . Abnormal EKG 07/27/2012  . Preoperative clearance 07/27/2012  . Special screening for malignant neoplasms, colon 04/29/2011  . Gynecological examination 04/29/2011  . Routine general medical examination at a health care facility 04/20/2011  . Right shoulder pain 11/18/2010  . OSTEOARTHRITIS, HANDS, BILATERAL 10/31/2008  . Diabetes type 2, controlled ([place]) 07/10/2008  . Hyperlipidemia 01/26/2008  . Essential hypertension 01/26/2008  . ALLERGIC RHINITIS 12/07/2007   Past Medical History:  Diagnosis Date  . Allergic rhinitis   . Breast cancer ([place])   . DM2 (diabetes mellitus, type 2) ([place])   . GERD (gastroesophageal reflux disease)   . Hx of cardiovascular stress test    a. Lex MV 2/14:  EF 69%, no ischemia  . Hypertension   . Iron deficiency anemia   . Obesity   . Osteoarthritis    hands   Past Surgical History:  Procedure Laterality Date  . APPENDECTOMY  1976  . BREAST LUMPECTOMY Right    right breast, lumpectomy w/nodes  . GASTRIC RESTRICTION SURGERY     for reflux  .  HYSTEROSCOPY W/D&C N/A 10/18/2012   Procedure: DILATATION AND CURETTAGE /HYSTEROSCOPY;  Surgeon: Mora Bellman, MD;  Location: [facility];  Service: Gynecology;  Laterality: N/A;  . POLYPECTOMY N/A 10/18/2012   Procedure: POLYPECTOMY;  Surgeon: Mora Bellman, MD;  Location: [facility];  Service: Gynecology;  Laterality: N/A;   Social History  Substance Use Topics  . Smoking status: Never Smoker  . Smokeless tobacco: Never Used  . Alcohol use Not on file   Family History  Problem Relation Age of Onset  . Diabetes Mother   . Heart disease Mother     Pacemaker, CHF  . Stroke Father   . CAD Father 77    Died with MI  . Esophageal cancer Brother   . Colon cancer Neg Hx   . Rectal cancer Neg Hx   . Stomach cancer Neg Hx   . Hypertension Mother   . Kidney cancer Mother   . Heart disease Sister   . Diabetes Sister    Allergies  Allergen Reactions  . Cholestyramine Other (See Comments)    REACTION: Muscles tightened up, drew up.  . Lipitor [Atorvastatin] Other (See Comments)    Muscle cramping  . Niacin Nausea And Vomiting  . Simvastatin  Muscular pain   Current Outpatient Prescriptions on File Prior to Visit  Medication Sig Dispense Refill  . acetaminophen (TYLENOL) 500 MG tablet Take 500 mg by mouth every 6 (six) hours as needed for mild pain.     Marland Kitchen amitriptyline (ELAVIL) 25 MG tablet Take 1 tablet by mouth at  bedtime 90 tablet 3  . aspirin 81 MG tablet Take 81 mg by mouth daily.    . Cholecalciferol (VITAMIN D) 1000 UNITS capsule Take 2,000 Units by mouth daily.     . fenofibrate (TRICOR) 145 MG tablet Take 1 tablet by mouth  daily 90 tablet 3  . ferrous sulfate 325 (65 FE) MG EC tablet Take 1 tablet (325 mg total) by mouth 3 (three) times daily with meals. 90 tablet 6  . glipiZIDE (GLUCOTROL XL) 2.5 MG 24 hr tablet Take 1 tablet by mouth  every evening 90 tablet 3  . hydrochlorothiazide (HYDRODIURIL) 25 MG tablet Take 1 tablet by mouth  daily 90 tablet 3  . losartan (COZAAR) 100  MG tablet Take 1 tablet by mouth  daily 90 tablet 3  . meclizine (ANTIVERT) 25 MG tablet Take 1 tablet (25 mg total) by mouth 3 (three) times daily as needed. 90 tablet 0  . metFORMIN (GLUCOPHAGE) 1000 MG tablet Take 1 tablet by mouth two  times daily with a meal 180 tablet 3  . Omega-3 Fatty Acids (FISH OIL) 1200 MG CAPS Take 1 capsule by mouth daily.    Marland Kitchen omeprazole (PRILOSEC) 20 MG capsule Take 1 capsule by mouth  daily 90 capsule 3  . ONETOUCH DELICA LANCETS 99J MISC Check blood sugar once  daily and as directed. Dx  E11.9 100 each 3  . ONETOUCH VERIO test strip TEST BLOOD SUGAR ONCE DAILY FOR DIABETES MELLITUS AS AS NEEDED. 100 each 3  . OVER THE COUNTER MEDICATION Vitalizer Gold without Vitamin  K taking one group of supplement daily.     Marland Kitchen SALINE NASAL SPRAY NA Place 1 spray into the nose daily.      No current facility-administered medications on file prior to visit.     Review of Systems Review of Systems  Constitutional: Negative for fever, appetite change, fatigue and unexpected weight change.  Eyes: Negative for pain and visual disturbance.  Respiratory: Negative for cough and shortness of breath.   Cardiovascular: Negative for cp or palpitations    Gastrointestinal: Negative for nausea, diarrhea and constipation.  Genitourinary: Negative for urgency and frequency.  Skin: Negative for pallor or rash   MSK neg for new joint swelling or redness  Neurological: Negative for weakness, light-headedness, numbness and headaches.  Hematological: Negative for adenopathy. Does not bruise/bleed easily.  Psychiatric/Behavioral: Negative for dysphoric mood. The patient is not nervous/anxious.         Objective:   Physical Exam  Constitutional: She appears well-developed and well-nourished. No distress.  obese and well appearing   HENT:  Head: Normocephalic and atraumatic.  Mouth/Throat: Oropharynx is clear and moist.  Eyes: Conjunctivae and EOM are normal. Pupils are equal, round,  and reactive to light.  Neck: Normal range of motion. Neck supple. Carotid bruit is not present. No thyromegaly present.  Cardiovascular: Normal rate, regular rhythm, normal heart sounds and intact distal pulses.  Exam reveals no gallop.   Pulmonary/Chest: Effort normal and breath sounds normal. No respiratory distress. She has no wheezes. She has no rales.  No crackles  Abdominal: Soft. Bowel sounds are normal. She exhibits no distension and no abdominal  bruit. There is no tenderness.  Musculoskeletal: She exhibits tenderness. She exhibits no edema.  Some changes of OA in hands Tender over thenar and hypothenar areas and forearms No trapezius tenderness No myofasical trigger points  Leg tenderness in quad and calf area-no M or erythema or homan's sign   No acute joint swelling or erythema  Lymphadenopathy:    She has no cervical adenopathy.  Neurological: She is alert. She has normal reflexes. She displays no atrophy. She exhibits normal muscle tone. Coordination and gait normal.  Skin: Skin is warm and dry. No rash noted.  Psychiatric: She has a normal mood and affect.          Assessment & Plan:   Problem List Items Addressed This Visit      Other   Muscle pain - Primary    In hands and legs Hx of OA hands -but describes as muscle (not joint) pain  ? If due to fenofibrate-will hold that for 2 wk and update Also ESR today on the off chance this is PMR Disc use of analgesics       Relevant Orders   Sedimentation Rate (Completed)    Other Visit Diagnoses    Need for vaccination with 13-polyvalent pneumococcal conjugate vaccine       Relevant Medications   pneumococcal 13-valent conjugate vaccine (PREVNAR 13) injection 0.5 mL (Completed)

## 2016-02-10 NOTE — Assessment & Plan Note (Signed)
In hands and legs Hx of OA hands -but describes as muscle (not joint) pain  ? If due to fenofibrate-will hold that for 2 wk and update Also ESR today on the off chance this is PMR Disc use of analgesics

## 2016-02-11 ENCOUNTER — Encounter: Payer: Self-pay | Admitting: *Deleted

## 2016-02-22 ENCOUNTER — Telehealth: Payer: Self-pay

## 2016-02-22 NOTE — Telephone Encounter (Signed)
Stay off of it  We will not replace with anything right now  Take off med list please  I'm glad she is feeling better!

## 2016-02-22 NOTE — Telephone Encounter (Signed)
Pt notified of Dr. Marliss Coots comments and per pt request I called [facility] and cancelled her Rx with them too. Med removed from med list

## 2016-02-22 NOTE — Telephone Encounter (Signed)
Pt left vm; pt was seen 02/08/16 and was to stop fenofibrate for 2 weeks to see if that helped pain; pt said her muscle problems in legs,arms and back are better since stopping the fenofibrate. Pt is able to use rt hand now and pt can lay on lt side; legs are not hurting and pt is able to sleep at night.

## 2016-04-04 ENCOUNTER — Other Ambulatory Visit: Payer: Self-pay | Admitting: Family Medicine

## 2016-04-04 DIAGNOSIS — Z1231 Encounter for screening mammogram for malignant neoplasm of breast: Secondary | ICD-10-CM

## 2016-04-10 ENCOUNTER — Ambulatory Visit (INDEPENDENT_AMBULATORY_CARE_PROVIDER_SITE_OTHER): Payer: Medicare Other

## 2016-04-10 ENCOUNTER — Ambulatory Visit: Payer: Medicare Other

## 2016-04-10 DIAGNOSIS — Z23 Encounter for immunization: Secondary | ICD-10-CM

## 2016-04-28 ENCOUNTER — Ambulatory Visit
Admission: RE | Admit: 2016-04-28 | Discharge: 2016-04-28 | Disposition: A | Discharge status: Home / Self Care | Payer: Medicare Other | Source: Ambulatory Visit | Attending: Family Medicine | Admitting: Family Medicine

## 2016-04-28 DIAGNOSIS — Z1231 Encounter for screening mammogram for malignant neoplasm of breast: Secondary | ICD-10-CM

## 2016-06-09 ENCOUNTER — Telehealth: Payer: Self-pay | Admitting: Family Medicine

## 2016-06-09 NOTE — Telephone Encounter (Signed)
Please make an appt to be seen and stick to the 1/2 pill  Thanks for letting me know

## 2016-06-09 NOTE — Telephone Encounter (Signed)
Pt calling to report that her BP has been low and she is feeling dizzy and light headed.  She started on Sunday taking a half a pill instead of the whole pill.  She said her bp isnt as low but she is still worried about it. [phone]

## 2016-06-09 NOTE — Telephone Encounter (Signed)
Appt scheduled and pt will stick with 1/2 tab until she is seen

## 2016-06-11 ENCOUNTER — Encounter: Payer: Self-pay | Admitting: Family Medicine

## 2016-06-11 ENCOUNTER — Ambulatory Visit (INDEPENDENT_AMBULATORY_CARE_PROVIDER_SITE_OTHER): Payer: Medicare Other | Admitting: Family Medicine

## 2016-06-11 VITALS — BP 138/78 | HR 104 | Temp 98.2°F | Ht 59.5 in | Wt 159.2 lb

## 2016-06-11 DIAGNOSIS — R55 Syncope and collapse: Secondary | ICD-10-CM | POA: Diagnosis not present

## 2016-06-11 DIAGNOSIS — E119 Type 2 diabetes mellitus without complications: Secondary | ICD-10-CM | POA: Diagnosis not present

## 2016-06-11 DIAGNOSIS — I10 Essential (primary) hypertension: Secondary | ICD-10-CM | POA: Diagnosis not present

## 2016-06-11 NOTE — Progress Notes (Signed)
Pre visit review using our clinic review tool, if applicable. No additional management support is needed unless otherwise documented below in the visit note. 

## 2016-06-11 NOTE — Patient Instructions (Addendum)
Go back to losartan 100 and if you have another episode of blood pressure goes lower that 100/60 - cut it back again and let me know I think you had a near fainting episode (? Due to hard work or getting over heated)  Drink lots of fluids Keep Martha Hamilton posted  Labs today   Try to take time to care for yourself when you can

## 2016-06-11 NOTE — Progress Notes (Signed)
Subjective:    Patient ID: Martha Hamilton, female    DOB: 1951-05-04, 65 y.o.   MRN: [id number]  HPI Here for f/u of hypertension   Wt Readings from Last 3 Encounters:  06/11/16 159 lb 4 oz (72.2 kg)  02/08/16 155 lb 8 oz (70.5 kg)  06/18/15 162 lb (73.5 kg)    Pulse Readings from Last 3 Encounters:  06/11/16 (!) 104  02/08/16 98  06/18/15 (!) 103     BP Readings from Last 3 Encounters:  06/11/16 138/78  02/08/16 116/64  06/18/15 120/70    Last Thursday she was working with her mother- she felt funny and got hot and had to sit down (that was thanksgiving) - cooking all day  Vision got blurry briefly  Blood sugar was ok  Took her bp 88/47 with pulse of 120  Sure she did not take her med incorrectly -keeps it very organized in a pill box  As the day went on up to 107/60  Next day lowest ws 97/58  Then started coming back to normal from there and then more elevated   She cut out caffeine completely  Thinks she drinks enough fluids - decaf tea and water - takes in 1/4 gallon of unsweet tea daily and then at least 3 (16 oz) bottles of water  Never forgets to drink  Was not feeling stressed   Often 123XX123 systolic over Q000111Q to 123XX123 since 11/26   Taking losartan 100, hctz 25 mg Cut the losartan to 50 after her episode and now bp is higher   Does not get dizzy when changing position  Exercise - not as much lately -but has to work physically with her mother a lot   She lightly hit her head searching under a bed 3 wk ago -got better immediately  No ha or dizziness or symptoms  Thinks she has a knot for a few days   EKG today rate of 92 with short PR interval and no acute changes    Patient Active Problem List   Diagnosis Date Noted  . Pre-syncope 06/11/2016  . Muscle pain 02/08/2016  . Acute viral bronchitis 03/20/2015  . Pain in joint, shoulder region 06/14/2013  . Anemia 02/09/2013  . Abnormal EKG 07/27/2012  . Preoperative clearance 07/27/2012  . Special  screening for malignant neoplasms, colon 04/29/2011  . Gynecological examination 04/29/2011  . Routine general medical examination at a health care facility 04/20/2011  . Right shoulder pain 11/18/2010  . OSTEOARTHRITIS, HANDS, BILATERAL 10/31/2008  . Diabetes type 2, controlled ([place]) 07/10/2008  . Hyperlipidemia 01/26/2008  . Essential hypertension 01/26/2008  . ALLERGIC RHINITIS 12/07/2007   Past Medical History:  Diagnosis Date  . Allergic rhinitis   . Breast cancer ([place])   . DM2 (diabetes mellitus, type 2) ([place])   . GERD (gastroesophageal reflux disease)   . Hx of cardiovascular stress test    a. Lex MV 2/14:  EF 69%, no ischemia  . Hypertension   . Iron deficiency anemia   . Obesity   . Osteoarthritis    hands   Past Surgical History:  Procedure Laterality Date  . APPENDECTOMY  1976  . BREAST LUMPECTOMY Right    right breast, lumpectomy w/nodes  . GASTRIC RESTRICTION SURGERY     for reflux  . HYSTEROSCOPY W/D&C N/A 10/18/2012   Procedure: DILATATION AND CURETTAGE /HYSTEROSCOPY;  Surgeon: Mora Bellman, MD;  Location: [facility];  Service: Gynecology;  Laterality: N/A;  . POLYPECTOMY N/A 10/18/2012  Procedure: POLYPECTOMY;  Surgeon: Mora Bellman, MD;  Location: [facility];  Service: Gynecology;  Laterality: N/A;   Social History  Substance Use Topics  . Smoking status: Never Smoker  . Smokeless tobacco: Never Used  . Alcohol use Not on file   Family History  Problem Relation Age of Onset  . Diabetes Mother   . Heart disease Mother     Pacemaker, CHF  . Stroke Father   . CAD Father 71    Died with MI  . Esophageal cancer Brother   . Colon cancer Neg Hx   . Rectal cancer Neg Hx   . Stomach cancer Neg Hx   . Hypertension Mother   . Kidney cancer Mother   . Heart disease Sister   . Diabetes Sister    Allergies  Allergen Reactions  . Cholestyramine Other (See Comments)    REACTION: Muscles tightened up, drew up.  . Lipitor [Atorvastatin] Other (See Comments)     Muscle cramping  . Niacin Nausea And Vomiting  . Simvastatin     Muscular pain   Current Outpatient Prescriptions on File Prior to Visit  Medication Sig Dispense Refill  . acetaminophen (TYLENOL) 500 MG tablet Take 500 mg by mouth every 6 (six) hours as needed for mild pain.     Marland Kitchen amitriptyline (ELAVIL) 25 MG tablet Take 1 tablet by mouth at  bedtime 90 tablet 3  . aspirin 81 MG tablet Take 81 mg by mouth daily.    . Cholecalciferol (VITAMIN D) 1000 UNITS capsule Take 2,000 Units by mouth daily.     Marland Kitchen glipiZIDE (GLUCOTROL XL) 2.5 MG 24 hr tablet Take 1 tablet by mouth  every evening 90 tablet 3  . hydrochlorothiazide (HYDRODIURIL) 25 MG tablet Take 1 tablet by mouth  daily 90 tablet 3  . losartan (COZAAR) 100 MG tablet Take 1 tablet by mouth  daily 90 tablet 3  . meclizine (ANTIVERT) 25 MG tablet Take 1 tablet (25 mg total) by mouth 3 (three) times daily as needed. 90 tablet 0  . metFORMIN (GLUCOPHAGE) 1000 MG tablet Take 1 tablet by mouth two  times daily with a meal 180 tablet 3  . Omega-3 Fatty Acids (FISH OIL) 1200 MG CAPS Take 1 capsule by mouth daily.    Marland Kitchen omeprazole (PRILOSEC) 20 MG capsule Take 1 capsule by mouth  daily 90 capsule 3  . ONETOUCH DELICA LANCETS 99991111 MISC Check blood sugar once  daily and as directed. Dx  E11.9 100 each 3  . ONETOUCH VERIO test strip TEST BLOOD SUGAR ONCE DAILY FOR DIABETES MELLITUS AS AS NEEDED. 100 each 3  . SALINE NASAL SPRAY NA Place 1 spray into the nose daily.      No current facility-administered medications on file prior to visit.     Review of Systems Review of Systems  Constitutional: Negative for fever, appetite change, fatigue and unexpected weight change.  Eyes: Negative for pain and visual disturbance.  Respiratory: Negative for cough and shortness of breath.   Cardiovascular: Negative for cp or palpitations    Gastrointestinal: Negative for nausea, diarrhea and constipation.  Genitourinary: Negative for urgency and frequency.  Skin:  Negative for pallor or rash   Neurological: Negative for weakness, light-headedness, numbness and headaches. (she is no longer light headed)  Hematological: Negative for adenopathy. Does not bruise/bleed easily.  Psychiatric/Behavioral: Negative for dysphoric mood. The patient is not nervous/anxious.         Objective:   Physical Exam  Constitutional:  She appears well-developed and well-nourished. No distress.  HENT:  Head: Normocephalic and atraumatic.  Mouth/Throat: Oropharynx is clear and moist.  Eyes: Conjunctivae and EOM are normal. Pupils are equal, round, and reactive to light.  Neck: Normal range of motion. Neck supple. No JVD present. Carotid bruit is not present. No thyromegaly present.  Cardiovascular: Regular rhythm, normal heart sounds and intact distal pulses.  Exam reveals no gallop.   HR in low 90s  Pulmonary/Chest: Effort normal and breath sounds normal. No respiratory distress. She has no wheezes. She has no rales.  No crackles  Abdominal: Soft. Bowel sounds are normal. She exhibits no distension, no abdominal bruit and no mass. There is no tenderness.  Musculoskeletal: She exhibits no edema or tenderness.  Lymphadenopathy:    She has no cervical adenopathy.  Neurological: She is alert. She has normal reflexes. She displays no atrophy and no tremor. No cranial nerve deficit or sensory deficit. She exhibits normal muscle tone. Coordination and gait normal.  Skin: Skin is warm and dry. No rash noted. No pallor.  Psychiatric: She has a normal mood and affect.  Cheerful and talkative          Assessment & Plan:   Problem List Items Addressed This Visit      Cardiovascular and Mediastinum   Pre-syncope    Upon rev of hx suspect this was caused by overheating and poss stress of caring for her mother  Brief and with some vasovagal symptoms  Now totally resolved Will go back to her regular losartan dose but update [place] asap if this happens again / or any low bp    Disc imp of regular meals and hydration and avoidance of over heating  ekg re assuring as well as exam Labs today      Relevant Orders   EKG 12-Lead (Completed)   Essential hypertension - Primary    Pt had episode of pre syncope and then cut back her losartan  Now bp is higher (home)- inst to go back on the 100 mg  Also proper eating/ exercise when she can If any further bp lability-will update  She understands the correct way to check blood pressure as well        Relevant Orders   CBC with Differential/Platelet   Comprehensive metabolic panel   TSH   Lipid panel     Endocrine   Diabetes type 2, controlled ([place])    A1C today  Pt states blood glucose has been stable       Relevant Orders   Hemoglobin A1c

## 2016-06-12 ENCOUNTER — Telehealth: Payer: Self-pay | Admitting: *Deleted

## 2016-06-12 DIAGNOSIS — D649 Anemia, unspecified: Secondary | ICD-10-CM

## 2016-06-12 LAB — CBC WITH DIFFERENTIAL/PLATELET
BASOS PCT: 0.6 % (ref 0.0–3.0)
Basophils Absolute: 0.1 10*3/uL (ref 0.0–0.1)
EOS PCT: 3.5 % (ref 0.0–5.0)
Eosinophils Absolute: 0.3 10*3/uL (ref 0.0–0.7)
HEMATOCRIT: 30.7 % — AB (ref 36.0–46.0)
HEMOGLOBIN: 9.8 g/dL — AB (ref 12.0–15.0)
LYMPHS PCT: 28.9 % (ref 12.0–46.0)
Lymphs Abs: 2.5 10*3/uL (ref 0.7–4.0)
MCHC: 32 g/dL (ref 30.0–36.0)
MCV: 79 fl (ref 78.0–100.0)
Monocytes Absolute: 0.5 10*3/uL (ref 0.1–1.0)
Monocytes Relative: 6.2 % (ref 3.0–12.0)
Neutro Abs: 5.4 10*3/uL (ref 1.4–7.7)
Neutrophils Relative %: 60.8 % (ref 43.0–77.0)
Platelets: 371 10*3/uL (ref 150.0–400.0)
RBC: 3.89 Mil/uL (ref 3.87–5.11)
RDW: 14.5 % (ref 11.5–15.5)
WBC: 8.8 10*3/uL (ref 4.0–10.5)

## 2016-06-12 LAB — COMPREHENSIVE METABOLIC PANEL
ALBUMIN: 3.9 g/dL (ref 3.5–5.2)
ALK PHOS: 47 U/L (ref 39–117)
ALT: 24 U/L (ref 0–35)
AST: 22 U/L (ref 0–37)
BILIRUBIN TOTAL: 0.1 mg/dL — AB (ref 0.2–1.2)
BUN: 16 mg/dL (ref 6–23)
CALCIUM: 9.6 mg/dL (ref 8.4–10.5)
CHLORIDE: 99 meq/L (ref 96–112)
CO2: 29 meq/L (ref 19–32)
Creatinine, Ser: 0.61 mg/dL (ref 0.40–1.20)
GFR: 104.5 mL/min (ref >60.00)
GLUCOSE: 92 mg/dL (ref 70–99)
POTASSIUM: 4.3 meq/L (ref 3.5–5.1)
Sodium: 138 mEq/L (ref 135–145)
Total Protein: 7.6 g/dL (ref 6.0–8.3)

## 2016-06-12 LAB — LDL CHOLESTEROL, DIRECT: LDL DIRECT: 82 mg/dL

## 2016-06-12 LAB — LIPID PANEL
CHOL/HDL RATIO: 4
CHOLESTEROL: 172 mg/dL (ref 0–200)
HDL: 42.5 mg/dL (ref >39.00)
NonHDL: 129.36
Triglycerides: 377 mg/dL — ABNORMAL HIGH (ref 0.0–149.0)
VLDL: 75.4 mg/dL — ABNORMAL HIGH (ref 0.0–40.0)

## 2016-06-12 LAB — HEMOGLOBIN A1C: Hgb A1c MFr Bld: 6.3 % (ref 4.6–6.5)

## 2016-06-12 LAB — TSH: TSH: 1.01 u[IU]/mL (ref 0.35–4.50)

## 2016-06-12 MED ORDER — FERROUS SULFATE 325 (65 FE) MG PO TABS: 325.0000 mg | ORAL_TABLET | Freq: Two times a day (BID) | ORAL | 3 refills | Status: DC

## 2016-06-12 NOTE — Assessment & Plan Note (Signed)
A1C today  Pt states blood glucose has been stable

## 2016-06-12 NOTE — Telephone Encounter (Signed)
-----   Message from Abner Greenspan, MD sent at 06/12/2016  3:55 PM EST ----- Pt is anemic again - I want to get her back to GI , please ask if agreeable Please call in ferrous sulfate 325 mg and tell her to take 1 pill bid #60 3 ref (or she can get it over the counter) Blood sugar up a bit- watch diet carefully Other labs are stable

## 2016-06-12 NOTE — Assessment & Plan Note (Addendum)
Upon rev of hx suspect this was caused by overheating and poss stress of caring for her mother  Brief and with some vasovagal symptoms  Now totally resolved Will go back to her regular losartan dose but update [place] asap if this happens again / or any low bp  Disc imp of regular meals and hydration and avoidance of over heating  ekg re assuring as well as exam Labs today

## 2016-06-12 NOTE — Assessment & Plan Note (Signed)
Pt had episode of pre syncope and then cut back her losartan  Now bp is higher (home)- inst to go back on the 100 mg  Also proper eating/ exercise when she can If any further bp lability-will update  She understands the correct way to check blood pressure as well

## 2016-06-12 NOTE — Telephone Encounter (Signed)
Ref done Will route to [place] 

## 2016-06-12 NOTE — Telephone Encounter (Addendum)
Pt notified of lab results and Dr. Marliss Coots comments. Rx for iron sent to pharmacy and pt does agree with GI referral pt has seen Dr. Scarlette Shorts in the past and would like to be referred back to him, please put referral in and I advised pt our [facility] will call to schedule appt

## 2016-06-18 ENCOUNTER — Encounter: Payer: Self-pay | Admitting: Internal Medicine

## 2016-06-18 ENCOUNTER — Ambulatory Visit (INDEPENDENT_AMBULATORY_CARE_PROVIDER_SITE_OTHER): Payer: Medicare Other | Admitting: Internal Medicine

## 2016-06-18 VITALS — BP 162/78 | HR 110 | Ht 59.25 in | Wt 161.0 lb

## 2016-06-18 DIAGNOSIS — D509 Iron deficiency anemia, unspecified: Secondary | ICD-10-CM

## 2016-06-18 NOTE — Progress Notes (Signed)
HISTORY OF PRESENT ILLNESS:  Martha Hamilton is a 65 y.o. female with past medical history as listed below who is referred by Dr. Glori Bickers regarding symptomatic iron deficiency anemia. Patient was evaluated greater than 3 years ago for progressive iron deficiency anemia. She underwent complete colonoscopy October 2014. This was normal including intubation of the terminal ileum. She subsequently underwent upper endoscopy November 2014. This was found to have a spontaneously bleeding pyloric channel polypoid lesion which was treated with cauterization and Endo Clip. Biopsies revealed benign inflammatory tissue. She was placed on iron twice daily and followed up with GI 06/15/2013. At that time she was asymptomatic. Her hemoglobin was monitored on iron therapy. Over time her blood counts normalized. One year ago her hemoglobin was 12.7. Last year she tells me that she was instructed by her primary provider to discontinue iron therapy. Patient was well until around Thanksgiving that she when she felt a bit dizzy. She thought maybe it was her blood pressure. She was evaluated and found to be anemic with hemoglobin of 9.8. MCV 79.0. Patient denies melena or hematochezia. No abdominal pain change in bowel habits or weight loss. 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 She recently resumed iron twice daily  REVIEW OF SYSTEMS:  All non-GI ROS negative except for arthritis, sinus and allergy trouble  Past Medical History:  Diagnosis Date  . Allergic rhinitis   . Breast cancer ([place])   . DM2 (diabetes mellitus, type 2) ([place])   . GERD (gastroesophageal reflux disease)   . Hx of cardiovascular stress test    a. Lex MV 2/14:  EF 69%, no ischemia  . Hypertension   . Iron deficiency anemia   . Obesity   . Osteoarthritis    hands    Past Surgical History:  Procedure Laterality Date  . APPENDECTOMY  1976  . BREAST LUMPECTOMY  Right    right breast, lumpectomy w/nodes  . GASTRIC RESTRICTION SURGERY     for reflux  . HYSTEROSCOPY W/D&C N/A 10/18/2012   Procedure: DILATATION AND CURETTAGE /HYSTEROSCOPY;  Surgeon: Mora Bellman, MD;  Location: [facility];  Service: Gynecology;  Laterality: N/A;  . POLYPECTOMY N/A 10/18/2012   Procedure: POLYPECTOMY;  Surgeon: Mora Bellman, MD;  Location: [facility];  Service: Gynecology;  Laterality: N/A;    Social History Martha Hamilton  reports that she has never smoked. She has never used smokeless tobacco. She reports that she does not use drugs. Her alcohol history is not on file.  family history includes CAD (age of onset: 65) in her father; Diabetes in her mother and sister; Esophageal cancer in her brother; Heart disease in her mother and sister; Hypertension in her mother; Kidney cancer in her mother; Stroke in her father.  Allergies  Allergen Reactions  . Cholestyramine Other (See Comments)    REACTION: Muscles tightened up, drew up.  . Lipitor [Atorvastatin] Other (See Comments)    Muscle cramping  . Niacin Nausea And Vomiting  . Simvastatin     Muscular pain       PHYSICAL EXAMINATION: Vital signs: BP (!) 162/78   Pulse (!) 110   Ht 4' 11.25" (1.505 m)   Wt 161 lb (73 kg)   LMP 06/13/2001   BMI 32.24 kg/m   Constitutional: generally well-appearing, no acute distress Psychiatric: alert and oriented x3, cooperative Eyes: extraocular movements intact, anicteric, conjunctiva Pale Mouth: oral pharynx moist, no  lesions Neck: supple no lymphadenopathy Cardiovascular: heart regular rate and rhythm, no murmur Lungs: clear to auscultation bilaterally Abdomen: soft, nontender, nondistended, no obvious ascites, no peritoneal signs, normal bowel sounds, no organomegaly Rectal: Omitted Extremities: no clubbing cyanosis or lower extremity edema bilaterally Skin: no lesions on visible extremities Neuro: No focal deficits. Cranial nerves intact  ASSESSMENT:  #1. Recurrent  iron deficiency anemia off iron therapy. Suspect recurrent gastric polypoid lesion or small bowel lesions such as AVM  PLAN:  #1. Continue iron twice daily. I suspect she will need to be on this indefinitely #2. Schedule upper endoscopy.The nature of the procedure, as well as the risks, benefits, and alternatives were carefully and thoroughly reviewed with the patient. Ample time for discussion and questions allowed. The patient understood, was satisfied, and agreed to proceed. #3. If upper endoscopy unrevealing or underwhelming, would proceed with capsule endoscopy #4. Monitoring of blood counts on iron prospectively  A copy of this consultation note has been sent to Dr. Glori Bickers

## 2016-06-18 NOTE — Patient Instructions (Signed)
You have been scheduled for an endoscopy. Please follow written instructions given to you at your visit today. If you use inhalers (even only as needed), please bring them with you on the day of your procedure.   

## 2016-06-24 ENCOUNTER — Ambulatory Visit (AMBULATORY_SURGERY_CENTER): Payer: Medicare Other | Admitting: Internal Medicine

## 2016-06-24 ENCOUNTER — Encounter: Payer: Self-pay | Admitting: Internal Medicine

## 2016-06-24 VITALS — BP 158/69 | HR 93 | Temp 99.1°F | Resp 22 | Ht 59.0 in | Wt 162.0 lb

## 2016-06-24 DIAGNOSIS — K2970 Gastritis, unspecified, without bleeding: Secondary | ICD-10-CM | POA: Diagnosis not present

## 2016-06-24 DIAGNOSIS — K2990 Gastroduodenitis, unspecified, without bleeding: Secondary | ICD-10-CM | POA: Diagnosis not present

## 2016-06-24 DIAGNOSIS — D509 Iron deficiency anemia, unspecified: Secondary | ICD-10-CM | POA: Diagnosis not present

## 2016-06-24 MED ORDER — SODIUM CHLORIDE 0.9 % IV SOLN: 500.0000 mL | INTRAVENOUS | Status: DC

## 2016-06-24 NOTE — Progress Notes (Signed)
Patient awakening,vss,report to rn 

## 2016-06-24 NOTE — Progress Notes (Signed)
No problems noted in the recovery room. maw 

## 2016-06-24 NOTE — Patient Instructions (Signed)
YOU HAD AN ENDOSCOPIC PROCEDURE TODAY AT [facility]:   Refer to the procedure report that was given to you for any specific questions about what was found during the examination.  If the procedure report does not answer your questions, please call your gastroenterologist to clarify.  If you requested that your care partner not be given the details of your procedure findings, then the procedure report has been included in a sealed envelope for you to review at your convenience later.  YOU SHOULD EXPECT: Some feelings of bloating in the abdomen. Passage of more gas than usual.  Walking can help get rid of the air that was put into your GI tract during the procedure and reduce the bloating. If you had a lower endoscopy (such as a colonoscopy or flexible sigmoidoscopy) you may notice spotting of blood in your stool or on the toilet paper. If you underwent a bowel prep for your procedure, you may not have a normal bowel movement for a few days.  Please Note:  You might notice some irritation and congestion in your nose or some drainage.  This is from the oxygen used during your procedure.  There is no need for concern and it should clear up in a day or so.  SYMPTOMS TO REPORT IMMEDIATELY:   Following lower endoscopy (colonoscopy or flexible sigmoidoscopy):  Excessive amounts of blood in the stool  Significant tenderness or worsening of abdominal pains  Swelling of the abdomen that is new, acute  Fever of 100F or higher   Following upper endoscopy (EGD)  Vomiting of blood or coffee ground material  New chest pain or pain under the shoulder blades  Painful or persistently difficult swallowing  New shortness of breath  Fever of 100F or higher  Black, tarry-looking stools  For urgent or emergent issues, a gastroenterologist can be reached at any hour by calling [phone].   DIET:  We do recommend a small meal at first, but then you may proceed to your regular diet.  Drink  plenty of fluids but you should avoid alcoholic beverages for 24 hours.  ACTIVITY:  You should plan to take it easy for the rest of today and you should NOT DRIVE or use heavy machinery until tomorrow (because of the sedation medicines used during the test).    FOLLOW UP: Our staff will call the number listed on your records the next business day following your procedure to check on you and address any questions or concerns that you may have regarding the information given to you following your procedure. If we do not reach you, we will leave a message.  However, if you are feeling well and you are not experiencing any problems, there is no need to return our call.  We will assume that you have returned to your regular daily activities without incident.  If any biopsies were taken you will be contacted by phone or by letter within the next 1-3 weeks.  Please call us at [phone] if you have not heard about the biopsies in 3 weeks.    SIGNATURES/CONFIDENTIALITY: You and/or your care partner have signed paperwork which will be entered into your electronic medical record.  These signatures attest to the fact that that the information above on your After Visit Summary has been reviewed and is understood.  Full responsibility of the confidentiality of this discharge information lies with you and/or your care-partner.   Handouts were given to your care partner on gastritis You  may resume your current medications today, including iron therapy twice daily. Your blood sugar was 108 in the recovery room. Repeat CBC and Ferritin level in 4 weeks.  Dr. Henrene Pastor nurse in the office will call you to set this up. Please call if any questions or concerns.

## 2016-06-24 NOTE — Op Note (Signed)
[facility] Patient Name: Martha Hamilton Procedure Date: 06/24/2016 7:50 AM MRN: [id number] Endoscopist: Docia Chuck. Henrene Pastor , MD Age: 65 Referring MD:  Date of Birth: 17-Jan-1951 Gender: Female Account #: [id number] Procedure:                Upper GI endoscopy Indications:              Iron deficiency anemia. Recurrent discontinuation                            of iron therapy. Previous extensive workup for iron                            deficiency Medicines:                Monitored Anesthesia Care Procedure:                Pre-Anesthesia Assessment:                           - Prior to the procedure, a History and Physical                            was performed, and patient medications and                            allergies were reviewed. The patient's tolerance of                            previous anesthesia was also reviewed. The risks                            and benefits of the procedure and the sedation                            options and risks were discussed with the patient.                            All questions were answered, and informed consent                            was obtained. Prior Anticoagulants: The patient has                            taken no previous anticoagulant or antiplatelet                            agents. ASA Grade Assessment: II - A patient with                            mild systemic disease. After reviewing the risks                            and benefits, the patient was deemed in  satisfactory condition to undergo the procedure.                           After obtaining informed consent, the endoscope was                            passed under direct vision. Throughout the                            procedure, the patient's blood pressure, pulse, and                            oxygen saturations were monitored continuously. The                            Model GIF-HQ190 [id number]) scope was  introduced                            through the mouth, and advanced to the third part                            of duodenum. The upper GI endoscopy was                            accomplished without difficulty. The patient                            tolerated the procedure well. Scope In: Scope Out: Findings:                 One mild benign-appearing, intrinsic stenosis was                            found at the gastroesophageal junction. No active                            inflammation..                           The exam of the esophagus was otherwise normal.                           Diffuse moderately erythematous mucosa without                            bleeding was found in the stomach. There is                            scattered erosions. Previous inflammatory                            prepyloric polyp was absent.                           The exam of the stomach was otherwise normal.  The examined duodenum was normal. Complications:            No immediate complications. Estimated Blood Loss:     Estimated blood loss: none. Impression:               - Benign-appearing esophageal stricture.                           - Erythematous mucosa in the stomach with erosions.                           - Normal examined duodenum.                           - No specimens collected. Recommendation:           - Repeat CBC and ferritin level 4 weeks.                           - Resume previous diet.                           - Continue present medications, including iron                            therapy twice daily. Docia Chuck. Henrene Pastor, MD 06/24/2016 8:37:42 AM This report has been signed electronically.

## 2016-06-25 ENCOUNTER — Telehealth: Payer: Self-pay

## 2016-06-25 NOTE — Telephone Encounter (Signed)
  Follow up Call-  Call back number 06/24/2016  Post procedure Call Back phone  # [phone]  Permission to leave phone message Yes  Some recent data might be hidden     Patient questions:  Do you have a fever, pain , or abdominal swelling? No. Pain Score  0 *  Have you tolerated food without any problems? Yes.    Have you been able to return to your normal activities? Yes.    Do you have any questions about your discharge instructions: Diet   No. Medications  No. Follow up visit  No.  Do you have questions or concerns about your Care? No.  Actions: * If pain score is 4 or above: No action needed, pain <4.

## 2016-09-08 ENCOUNTER — Encounter: Payer: Self-pay | Admitting: Family Medicine

## 2016-09-08 ENCOUNTER — Ambulatory Visit (INDEPENDENT_AMBULATORY_CARE_PROVIDER_SITE_OTHER): Payer: Medicare Other | Admitting: Family Medicine

## 2016-09-08 VITALS — BP 140/70 | HR 106 | Temp 98.7°F | Ht 59.25 in | Wt 158.2 lb

## 2016-09-08 DIAGNOSIS — E119 Type 2 diabetes mellitus without complications: Secondary | ICD-10-CM

## 2016-09-08 DIAGNOSIS — D649 Anemia, unspecified: Secondary | ICD-10-CM

## 2016-09-08 DIAGNOSIS — I10 Essential (primary) hypertension: Secondary | ICD-10-CM

## 2016-09-08 DIAGNOSIS — E2839 Other primary ovarian failure: Secondary | ICD-10-CM

## 2016-09-08 DIAGNOSIS — E7800 Pure hypercholesterolemia, unspecified: Secondary | ICD-10-CM | POA: Diagnosis not present

## 2016-09-08 NOTE — Assessment & Plan Note (Signed)
Lab Results  Component Value Date   HGBA1C 6.3 06/11/2016   Will re check at f/u  Eating better and exercising now that she has time for self care

## 2016-09-08 NOTE — Assessment & Plan Note (Signed)
Disc goals for lipids and reasons to control them Rev labs with pt Rev low sat fat diet in detail  Expect improved triglycerides with improved glucose control with lifestyle change Plan to check at June appt

## 2016-09-08 NOTE — Assessment & Plan Note (Signed)
bp is fair but not at goal   BP Readings from Last 1 Encounters:  09/08/16 140/70   No changes needed Disc lifstyle change with low sodium diet and exercise  She will check this at home 2-3 times weekly and report back with results Formerly had some hypotension

## 2016-09-08 NOTE — Patient Instructions (Addendum)
Stop at check out for referral for dexa   Labs today to check on anemia   See you in June for your welcome to medicare visit   Keep exercise - caution not to fall  Eat a healthy balanced diet low in fat and sugar

## 2016-09-08 NOTE — Progress Notes (Signed)
Subjective:    Patient ID: Martha Hamilton, female    DOB: 1951-05-31, 66 y.o.   MRN: [id number]  HPI  Here for f/u of chronic health problems   Lost her mother recently (cared for her 24 hours a day) - very much at peace now  Doing ok overall  Now has a little more time to take care of herself  Also interested in volunteering   Also interested in a pap test  Last one 3/14   Has her welcome to medicare visit in June   Wt Readings from Last 3 Encounters:  09/08/16 158 lb 4 oz (71.8 kg)  06/24/16 162 lb (73.5 kg)  06/18/16 161 lb (73 kg)  just starting to do some walking and also uses a step machine she can do when sitting  Weight is coming down  Is excited to get outdoors  Eating healthier also  bmi is 31.6  DM2 Lab Results  Component Value Date   HGBA1C 6.3 06/11/2016  wants to check that today    bp is stable today  No cp or palpitations or headaches or edema  No side effects to medicines   (had to prev cut back on losartan due to dizziness) Has felt ok -not checking at home  BP Readings from Last 3 Encounters:  09/08/16 (!) 142/68  06/24/16 (!) 158/69  06/18/16 (!) 162/78     Hx of hyperlipidemia  Lab Results  Component Value Date   CHOL 172 06/11/2016   CHOL 159 03/09/2015   CHOL 149 08/25/2014   Lab Results  Component Value Date   HDL 42.50 06/11/2016   HDL 34.40 (L) 03/09/2015   HDL 34.40 (L) 08/25/2014   Lab Results  Component Value Date   LDLCALC 51 10/21/2011   LDLCALC 48 04/29/2010   Lab Results  Component Value Date   TRIG 377.0 (H) 06/11/2016   TRIG 310.0 (H) 03/09/2015   TRIG 296.0 (H) 08/25/2014   Lab Results  Component Value Date   CHOLHDL 4 06/11/2016   CHOLHDL 5 03/09/2015   CHOLHDL 4 08/25/2014   Lab Results  Component Value Date   LDLDIRECT 82.0 06/11/2016   LDLDIRECT 88.0 03/09/2015   LDLDIRECT 92.0 08/25/2014     Was anemic with last labs  Lab Results  Component Value Date   WBC 8.8 06/11/2016   HGB 9.8 (L)  06/11/2016   HCT 30.7 (L) 06/11/2016   MCV 79.0 06/11/2016   PLT 371.0 06/11/2016   had EGD with some erosions noted  Also dilatation of esophagus  Taking her iron    Due for cbc and ferritin    She is interested in a dexa - has not had one yet  No falls or fractures since childhood   Patient Active Problem List   Diagnosis Date Noted  . Estrogen deficiency 09/08/2016  . Pre-syncope 06/11/2016  . Muscle pain 02/08/2016  . Anemia 02/09/2013  . Abnormal EKG 07/27/2012  . Special screening for malignant neoplasms, colon 04/29/2011  . Gynecological examination 04/29/2011  . Routine general medical examination at a health care facility 04/20/2011  . OSTEOARTHRITIS, HANDS, BILATERAL 10/31/2008  . Diabetes type 2, controlled ([place]) 07/10/2008  . Hyperlipidemia 01/26/2008  . Essential hypertension 01/26/2008  . ALLERGIC RHINITIS 12/07/2007   Past Medical History:  Diagnosis Date  . Allergic rhinitis   . Breast cancer ([place])   . DM2 (diabetes mellitus, type 2) ([place])   . GERD (gastroesophageal reflux disease)   . Hx of  cardiovascular stress test    a. Lex MV 2/14:  EF 69%, no ischemia  . Hyperlipidemia   . Hypertension   . Iron deficiency anemia   . Obesity   . Osteoarthritis    hands  . Ulcer [facility])    Past Surgical History:  Procedure Laterality Date  . APPENDECTOMY  1976  . BREAST LUMPECTOMY Right    right breast, lumpectomy w/nodes  . GASTRIC RESTRICTION SURGERY     for reflux  . HYSTEROSCOPY W/D&C N/A 10/18/2012   Procedure: DILATATION AND CURETTAGE /HYSTEROSCOPY;  Surgeon: Mora Bellman, MD;  Location: [facility];  Service: Gynecology;  Laterality: N/A;  . POLYPECTOMY N/A 10/18/2012   Procedure: POLYPECTOMY;  Surgeon: Mora Bellman, MD;  Location: [facility];  Service: Gynecology;  Laterality: N/A;   Social History  Substance Use Topics  . Smoking status: Never Smoker  . Smokeless tobacco: Never Used  . Alcohol use Not on file   Family History  Problem Relation Age of  Onset  . Diabetes Mother   . Heart disease Mother     Pacemaker, CHF  . Hypertension Mother   . Kidney cancer Mother   . Stroke Father   . CAD Father 79    Died with MI  . Esophageal cancer Brother   . Heart disease Sister   . Diabetes Sister   . Colon cancer Neg Hx   . Rectal cancer Neg Hx   . Stomach cancer Neg Hx    Allergies  Allergen Reactions  . Cholestyramine Other (See Comments)    REACTION: Muscles tightened up, drew up.  . Lipitor [Atorvastatin] Other (See Comments)    Muscle cramping  . Niacin Nausea And Vomiting  . Simvastatin     Muscular pain   Current Outpatient Prescriptions on File Prior to Visit  Medication Sig Dispense Refill  . acetaminophen (TYLENOL) 500 MG tablet Take 500 mg by mouth every 6 (six) hours as needed for mild pain.     Marland Kitchen amitriptyline (ELAVIL) 25 MG tablet Take 1 tablet by mouth at  bedtime 90 tablet 3  . aspirin 81 MG tablet Take 81 mg by mouth daily.    . Cholecalciferol (VITAMIN D) 1000 UNITS capsule Take 2,000 Units by mouth daily.     . ferrous sulfate 325 (65 FE) MG tablet Take 1 tablet (325 mg total) by mouth 2 (two) times daily with a meal. 60 tablet 3  . glipiZIDE (GLUCOTROL XL) 2.5 MG 24 hr tablet Take 1 tablet by mouth  every evening 90 tablet 3  . hydrochlorothiazide (HYDRODIURIL) 25 MG tablet Take 1 tablet by mouth  daily 90 tablet 3  . losartan (COZAAR) 100 MG tablet Take 1 tablet by mouth  daily 90 tablet 3  . metFORMIN (GLUCOPHAGE) 1000 MG tablet Take 1 tablet by mouth two  times daily with a meal 180 tablet 3  . Omega-3 Fatty Acids (FISH OIL) 1200 MG CAPS Take 1 capsule by mouth daily.    Marland Kitchen omeprazole (PRILOSEC) 20 MG capsule Take 1 capsule by mouth  daily 90 capsule 3  . ONETOUCH DELICA LANCETS 99991111 MISC Check blood sugar once  daily and as directed. Dx  E11.9 100 each 3  . ONETOUCH VERIO test strip TEST BLOOD SUGAR ONCE DAILY FOR DIABETES MELLITUS AS AS NEEDED. 100 each 3  . SALINE NASAL SPRAY NA Place 1 spray into the nose  daily.      Current Facility-Administered Medications on File Prior to Visit  Medication Dose  Route Frequency Provider Last Rate Last Dose  . 0.9 %  sodium chloride infusion  500 mL Intravenous Continuous Irene Shipper, MD        Review of Systems    Review of Systems  Constitutional: Negative for fever, appetite change, fatigue and unexpected weight change.  Eyes: Negative for pain and visual disturbance.  Respiratory: Negative for cough and shortness of breath.   Cardiovascular: Negative for cp or palpitations    Gastrointestinal: Negative for nausea, diarrhea and constipation.  Genitourinary: Negative for urgency and frequency.  Skin: Negative for pallor or rash   Neurological: Negative for weakness, light-headedness, numbness and headaches.  Hematological: Negative for adenopathy. Does not bruise/bleed easily.  Psychiatric/Behavioral: Negative for dysphoric mood. The patient is not nervous/anxious.  pos for grief which she is dealing with well     Objective:   Physical Exam  Constitutional: She appears well-developed and well-nourished. No distress.  obese and well appearing   HENT:  Head: Normocephalic and atraumatic.  Mouth/Throat: Oropharynx is clear and moist.  Eyes: Conjunctivae and EOM are normal. Pupils are equal, round, and reactive to light.  Neck: Normal range of motion. Neck supple. No JVD present. Carotid bruit is not present. No thyromegaly present.  Cardiovascular: Normal rate, regular rhythm, normal heart sounds and intact distal pulses.  Exam reveals no gallop.   Pulmonary/Chest: Effort normal and breath sounds normal. No respiratory distress. She has no wheezes. She has no rales.  No crackles  Abdominal: Soft. Bowel sounds are normal. She exhibits no distension, no abdominal bruit and no mass. There is no tenderness.  Musculoskeletal: She exhibits no edema.  Lymphadenopathy:    She has no cervical adenopathy.  Neurological: She is alert. She has normal  reflexes.  Skin: Skin is warm and dry. No rash noted.  Psychiatric: She has a normal mood and affect.          Assessment & Plan:   Problem List Items Addressed This Visit      Cardiovascular and Mediastinum   Essential hypertension - Primary    bp is fair but not at goal   BP Readings from Last 1 Encounters:  09/08/16 140/70   No changes needed Disc lifstyle change with low sodium diet and exercise  She will check this at home 2-3 times weekly and report back with results Formerly had some hypotension         Endocrine   Diabetes type 2, controlled ([place])    Lab Results  Component Value Date   HGBA1C 6.3 06/11/2016   Will re check at f/u  Eating better and exercising now that she has time for self care         Other   Anemia    Cbc and ferritin today  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

## 2016-09-08 NOTE — Assessment & Plan Note (Signed)
Post menopausal  Ref for first dexa Disc need for calcium/ vitamin D/ wt bearing exercise and bone density test every 2 y to monitor Disc safety/ fracture risk in detail

## 2016-09-08 NOTE — Progress Notes (Signed)
Pre visit review using our clinic review tool, if applicable. No additional management support is needed unless otherwise documented below in the visit note. 

## 2016-09-08 NOTE — Assessment & Plan Note (Signed)
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

## 2016-09-09 LAB — FERRITIN: Ferritin: 9.7 ng/mL — ABNORMAL LOW (ref 10.0–291.0)

## 2016-09-09 LAB — CBC WITH DIFFERENTIAL/PLATELET
BASOS PCT: 0.7 % (ref 0.0–3.0)
Basophils Absolute: 0.1 10*3/uL (ref 0.0–0.1)
EOS PCT: 3.6 % (ref 0.0–5.0)
Eosinophils Absolute: 0.4 10*3/uL (ref 0.0–0.7)
HEMATOCRIT: 35.2 % — AB (ref 36.0–46.0)
HEMOGLOBIN: 11.8 g/dL — AB (ref 12.0–15.0)
LYMPHS PCT: 28.3 % (ref 12.0–46.0)
Lymphs Abs: 2.9 10*3/uL (ref 0.7–4.0)
MCHC: 33.5 g/dL (ref 30.0–36.0)
MCV: 81.3 fl (ref 78.0–100.0)
Monocytes Absolute: 0.8 10*3/uL (ref 0.1–1.0)
Monocytes Relative: 8.3 % (ref 3.0–12.0)
NEUTROS ABS: 6 10*3/uL (ref 1.4–7.7)
Neutrophils Relative %: 59.1 % (ref 43.0–77.0)
Platelets: 362 10*3/uL (ref 150.0–400.0)
RBC: 4.33 Mil/uL (ref 3.87–5.11)
RDW: 17.1 % — AB (ref 11.5–15.5)
WBC: 10.1 10*3/uL (ref 4.0–10.5)

## 2016-09-18 ENCOUNTER — Other Ambulatory Visit: Payer: Self-pay | Admitting: Family Medicine

## 2016-09-18 ENCOUNTER — Ambulatory Visit
Admission: RE | Admit: 2016-09-18 | Discharge: 2016-09-18 | Disposition: A | Discharge status: Home / Self Care | Payer: Medicare Other | Source: Ambulatory Visit | Attending: Family Medicine | Admitting: Family Medicine

## 2016-09-18 DIAGNOSIS — Z1231 Encounter for screening mammogram for malignant neoplasm of breast: Secondary | ICD-10-CM

## 2016-09-18 DIAGNOSIS — E2839 Other primary ovarian failure: Secondary | ICD-10-CM

## 2016-10-02 ENCOUNTER — Other Ambulatory Visit: Payer: Self-pay | Admitting: Family Medicine

## 2016-11-26 LAB — HM DIABETES EYE EXAM

## 2016-11-27 ENCOUNTER — Encounter: Payer: Self-pay | Admitting: Family Medicine

## 2016-12-17 ENCOUNTER — Encounter: Payer: Self-pay | Admitting: Family Medicine

## 2016-12-17 ENCOUNTER — Ambulatory Visit (INDEPENDENT_AMBULATORY_CARE_PROVIDER_SITE_OTHER): Payer: Medicare Other | Admitting: Family Medicine

## 2016-12-17 VITALS — BP 128/72 | HR 99 | Temp 98.0°F | Ht 59.5 in | Wt 159.2 lb

## 2016-12-17 DIAGNOSIS — D649 Anemia, unspecified: Secondary | ICD-10-CM | POA: Diagnosis not present

## 2016-12-17 DIAGNOSIS — F4320 Adjustment disorder, unspecified: Secondary | ICD-10-CM

## 2016-12-17 DIAGNOSIS — Z0000 Encounter for general adult medical examination without abnormal findings: Secondary | ICD-10-CM | POA: Insufficient documentation

## 2016-12-17 DIAGNOSIS — E7800 Pure hypercholesterolemia, unspecified: Secondary | ICD-10-CM | POA: Diagnosis not present

## 2016-12-17 DIAGNOSIS — I10 Essential (primary) hypertension: Secondary | ICD-10-CM | POA: Diagnosis not present

## 2016-12-17 DIAGNOSIS — E119 Type 2 diabetes mellitus without complications: Secondary | ICD-10-CM

## 2016-12-17 DIAGNOSIS — F4321 Adjustment disorder with depressed mood: Secondary | ICD-10-CM | POA: Insufficient documentation

## 2016-12-17 LAB — COMPREHENSIVE METABOLIC PANEL
ALT: 23 U/L (ref 0–35)
AST: 21 U/L (ref 0–37)
Albumin: 4.1 g/dL (ref 3.5–5.2)
Alkaline Phosphatase: 46 U/L (ref 39–117)
BILIRUBIN TOTAL: 0.2 mg/dL (ref 0.2–1.2)
BUN: 17 mg/dL (ref 6–23)
CALCIUM: 9.7 mg/dL (ref 8.4–10.5)
CO2: 33 meq/L — AB (ref 19–32)
Chloride: 99 mEq/L (ref 96–112)
Creatinine, Ser: 0.56 mg/dL (ref 0.40–1.20)
GFR: 115.15 mL/min (ref >60.00)
Glucose, Bld: 121 mg/dL — ABNORMAL HIGH (ref 70–99)
POTASSIUM: 4.1 meq/L (ref 3.5–5.1)
Sodium: 138 mEq/L (ref 135–145)
Total Protein: 7.4 g/dL (ref 6.0–8.3)

## 2016-12-17 LAB — LIPID PANEL
Cholesterol: 173 mg/dL (ref 0–200)
HDL: 43.6 mg/dL (ref >39.00)
Total CHOL/HDL Ratio: 4

## 2016-12-17 LAB — CBC WITH DIFFERENTIAL/PLATELET
BASOS ABS: 0.1 10*3/uL (ref 0.0–0.1)
Basophils Relative: 0.8 % (ref 0.0–3.0)
Eosinophils Absolute: 0.2 10*3/uL (ref 0.0–0.7)
Eosinophils Relative: 3.2 % (ref 0.0–5.0)
HEMATOCRIT: 36.8 % (ref 36.0–46.0)
Hemoglobin: 12.3 g/dL (ref 12.0–15.0)
LYMPHS PCT: 33.9 % (ref 12.0–46.0)
Lymphs Abs: 2.2 10*3/uL (ref 0.7–4.0)
MCHC: 33.5 g/dL (ref 30.0–36.0)
MCV: 87.3 fl (ref 78.0–100.0)
Monocytes Absolute: 0.6 10*3/uL (ref 0.1–1.0)
Monocytes Relative: 9.8 % (ref 3.0–12.0)
NEUTROS ABS: 3.4 10*3/uL (ref 1.4–7.7)
Neutrophils Relative %: 52.3 % (ref 43.0–77.0)
PLATELETS: 367 10*3/uL (ref 150.0–400.0)
RBC: 4.22 Mil/uL (ref 3.87–5.11)
RDW: 15.7 % — AB (ref 11.5–15.5)
WBC: 6.5 10*3/uL (ref 4.0–10.5)

## 2016-12-17 LAB — FERRITIN: Ferritin: 20.5 ng/mL (ref 10.0–291.0)

## 2016-12-17 LAB — HEMOGLOBIN A1C: HEMOGLOBIN A1C: 6.1 % (ref 4.6–6.5)

## 2016-12-17 LAB — LDL CHOLESTEROL, DIRECT: Direct LDL: 89 mg/dL

## 2016-12-17 LAB — TSH: TSH: 0.97 u[IU]/mL (ref 0.35–4.50)

## 2016-12-17 NOTE — Patient Instructions (Addendum)
We will set you up for a nurse visit for pneumonia vaccine in august   Review the blue packet regarding advance directives-you may need to designate a power of attorney    Continue diabetic and low cholesterol diet  Take care of yourself  Labs today

## 2016-12-17 NOTE — Progress Notes (Signed)
Subjective:    Patient ID: Martha Hamilton, female    DOB: [birth year], 66 y.o.   MRN: [id number]  HPI  Here for welcome to medicare visit   I have personally reviewed the Medicare Annual Wellness questionnaire and have noted 1. The patient's medical and social history 2. Their use of alcohol, tobacco or illicit drugs 3. Their current medications and supplements 4. The patient's functional ability including ADL's, fall risks, home safety risks and hearing or visual             impairment. 5. Diet and physical activities 6. Evidence for depression or mood disorders  The patients weight, height, BMI have been recorded in the chart and visual acuity is per eye clinic.  I have made referrals, counseling and provided education to the patient based review of the above and I have provided the pt with a written personalized care plan for preventive services. Reviewed and updated provider list, see scanned forms.  See scanned forms.  Routine anticipatory guidance given to patient.  See health maintenance. Colon cancer screening- 2014 colonoscopy neg 10 year recall  Breast cancer screening mammogram 10/17- neg and repeat in a year  History of breast cancer -lumpectomy  Self breast exam-no lumps or changes  Pap 3/14- per pt nl with a polyp (gyn) - no gyn problems / has not had a hysterectomy  No vaginal bleeding  dexa 3/18 normal BMD, takes vitamin D regularly and has started exercising , walking and exercises also   No falls or fractures  Flu vaccine 9/17 Tetanus vaccine 7/10 Pneumovax- had prevnar 7/17 , will set up for ppv 23 in aug  Zoster vaccine 7/12  Advance directive- she has a living will/ ? About power of attorney  Cognitive function addressed- see scanned forms- and if abnormal then additional documentation follows.  No memory problems , no confusion / no issues at all  Is independent   Wt Readings from Last 3 Encounters:  12/17/16 159 lb 4 oz (72.2 kg)  09/08/16 158 lb 4 oz (71.8  kg)  06/24/16 162 lb (73.5 kg)  bmi is 31.6 Down a bit with exercise-plans to try and loose more  Eating a healthy diet   Had a GI bug-- gradually getting better/ still gets nauseated easily   PMH and SH reviewed  Meds, vitals, and allergies reviewed.   ROS: See HPI.  Otherwise negative.    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 She is going on a trip as well/ planning it just for her and a friend    Hearing Screening   125Hz  250Hz  500Hz  1000Hz  2000Hz  3000Hz  4000Hz  6000Hz  8000Hz   Right ear:   40 40 40  40    Left ear:   40 40 40  40    Vision Screening Comments: Pt had eye exam on 11/26/16 [place]   bp is stable today (imp with exercise) No cp or palpitations or headaches or edema  No side effects to medicines  BP Readings from Last 3 Encounters:  12/17/16 128/72  09/08/16 140/70  06/24/16 (!) 158/69     Sleeping better as well (no longer up caring for her mother)   DM2 Lab Results  Component Value Date   HGBA1C 6.3 06/11/2016   Due for labs  No concerns-no high or low bs   90s-112 in am)  Foot care is good  Exercising more  Eye exam 5/18   Hyperlipidemia Lab Results  Component Value Date   CHOL 172 06/11/2016   HDL 42.50 06/11/2016   LDLCALC 51 10/21/2011   LDLDIRECT 82.0 06/11/2016   TRIG 377.0 (H) 06/11/2016   CHOLHDL 4 06/11/2016   Due for labs  No medicine- just eating a healthy diet   Hx of anemia Lab Results  Component Value Date   WBC 10.1 09/08/2016   HGB 11.8 (L) 09/08/2016   HCT 35.2 (L) 09/08/2016   MCV 81.3 09/08/2016   PLT 362.0 09/08/2016    was improved  She is still taking iron pills  No abd pain   (aside from gi bug that is better)   Patient Active Problem List   Diagnosis Date Noted  . Welcome to Medicare preventive visit 12/17/2016  . Grief reaction 12/17/2016  . Estrogen deficiency 09/08/2016  . Pre-syncope 06/11/2016  . Muscle pain 02/08/2016  . Anemia 02/09/2013  . Abnormal EKG 07/27/2012  . Special screening for malignant neoplasms, colon 04/29/2011  . Gynecological examination 04/29/2011  . Routine general medical examination at a health care facility 04/20/2011  . OSTEOARTHRITIS, HANDS, BILATERAL 10/31/2008  . Diabetes type 2, controlled ([place]) 07/10/2008  . Hyperlipidemia 01/26/2008  . Essential hypertension 01/26/2008  . ALLERGIC RHINITIS 12/07/2007   Past Medical History:  Diagnosis Date  . Allergic rhinitis   . Breast cancer ([place])   . DM2 (diabetes mellitus, type 2) ([place])   . GERD (gastroesophageal reflux disease)   . Hx of cardiovascular stress test    a. Lex MV 2/14:  EF 69%, no ischemia  . Hyperlipidemia   . Hypertension   . Iron deficiency anemia   . Obesity   . Osteoarthritis    hands  . Ulcer    Past Surgical History:  Procedure Laterality Date  . APPENDECTOMY  1976  . BREAST LUMPECTOMY Right    right breast, lumpectomy w/nodes  . GASTRIC RESTRICTION SURGERY     for reflux  . HYSTEROSCOPY W/D&C N/A 10/18/2012   Procedure: DILATATION AND CURETTAGE /HYSTEROSCOPY;  Surgeon: Mora Bellman, MD;  Location: [facility];  Service: Gynecology;  Laterality: N/A;  . POLYPECTOMY N/A 10/18/2012   Procedure: POLYPECTOMY;  Surgeon: Mora Bellman, MD;  Location: [facility];  Service: Gynecology;  Laterality: N/A;   Social History  Substance Use Topics  . Smoking status: Never Smoker  . Smokeless tobacco: Never Used  . Alcohol use Not on file   Family History  Problem Relation Age of Onset  . Diabetes Mother   . Heart disease Mother        Pacemaker, CHF  . Hypertension Mother   . Kidney cancer  Mother   . Stroke Father   . CAD Father 39       Died with MI  . Esophageal cancer Brother   . Heart disease Sister   . Diabetes Sister   . Colon cancer Neg Hx   . Rectal cancer Neg Hx   . Stomach cancer Neg Hx    Allergies  Allergen Reactions  . Cholestyramine Other (See Comments)    REACTION: Muscles tightened up, drew up.  . Lipitor [Atorvastatin]  Other (See Comments)    Muscle cramping  . Niacin Nausea And Vomiting  . Simvastatin     Muscular pain   Current Outpatient Prescriptions on File Prior to Visit  Medication Sig Dispense Refill  . acetaminophen (TYLENOL) 500 MG tablet Take 500 mg by mouth every 6 (six) hours as needed for mild pain.     Marland Kitchen amitriptyline (ELAVIL) 25 MG tablet Take 1 tablet by mouth at  bedtime 90 tablet 3  . aspirin 81 MG tablet Take 81 mg by mouth daily.    . Cholecalciferol (VITAMIN D) 1000 UNITS capsule Take 2,000 Units by mouth daily.     . ferrous sulfate 325 (65 FE) MG tablet take 1 tablet by mouth twice a day with meals 60 tablet 3  . glipiZIDE (GLUCOTROL XL) 2.5 MG 24 hr tablet Take 1 tablet by mouth  every evening 90 tablet 3  . hydrochlorothiazide (HYDRODIURIL) 25 MG tablet Take 1 tablet by mouth  daily 90 tablet 3  . losartan (COZAAR) 100 MG tablet Take 1 tablet by mouth  daily 90 tablet 3  . metFORMIN (GLUCOPHAGE) 1000 MG tablet Take 1 tablet by mouth two  times daily with a meal 180 tablet 3  . Omega-3 Fatty Acids (FISH OIL) 1200 MG CAPS Take 1 capsule by mouth daily.    Marland Kitchen omeprazole (PRILOSEC) 20 MG capsule Take 1 capsule by mouth  daily 90 capsule 3  . ONETOUCH DELICA LANCETS 19X MISC Check blood sugar once  daily and as directed. Dx  E11.9 100 each 3  . ONETOUCH VERIO test strip TEST BLOOD SUGAR ONCE DAILY FOR DIABETES MELLITUS AS AS NEEDED. 100 each 3  . SALINE NASAL SPRAY NA Place 1 spray into the nose daily.      Current Facility-Administered Medications on File Prior to Visit  Medication Dose Route Frequency Provider Last Rate Last  Dose  . 0.9 %  sodium chloride infusion  500 mL Intravenous Continuous Irene Shipper, MD        Review of Systems Review of Systems  Constitutional: Negative for fever, appetite change, fatigue and unexpected weight change.  Eyes: Negative for pain and visual disturbance.  Respiratory: Negative for cough and shortness of breath.   Cardiovascular: Negative for cp or palpitations    Gastrointestinal: Negative for nausea, diarrhea and constipation.  Genitourinary: Negative for urgency and frequency.  Skin: Negative for pallor or rash   Neurological: Negative for weakness, light-headedness, numbness and headaches.  Hematological: Negative for adenopathy. Does not bruise/bleed easily.  Psychiatric/Behavioral: Negative for dysphoric mood. Pos for grief with some tearfulness and anxiety .         Objective:   Physical Exam  Constitutional: She appears well-developed and well-nourished. No distress.  obese and well appearing   HENT:  Head: Normocephalic and atraumatic.  Right Ear: External ear normal.  Left Ear: External ear normal.  Mouth/Throat: Oropharynx is clear and moist.  Eyes: Conjunctivae and EOM are normal. Pupils are equal, round, and reactive to light. No scleral icterus.  Neck: Normal range of motion. Neck supple. No JVD present. Carotid bruit is not present. No thyromegaly present.  Cardiovascular: Normal rate, regular rhythm, normal heart sounds and intact distal pulses.  Exam reveals no gallop.   Pulmonary/Chest: Effort normal and breath sounds normal. No respiratory distress. She has no wheezes. She exhibits no tenderness.  Abdominal: Soft. Bowel sounds are normal. She exhibits no distension, no abdominal bruit and no mass. There is no tenderness.  Genitourinary: No breast swelling, tenderness, discharge or bleeding.  Genitourinary Comments: Breast exam: No mass, nodules, thickening, tenderness, bulging, retraction, inflamation, nipple discharge or skin changes noted.  No  axillary or clavicular LA.     Baseline surgical changes in R breast   Musculoskeletal: Normal range of motion. She exhibits no edema or tenderness.  Lymphadenopathy:    She has no cervical adenopathy.  Neurological: She is alert. She has normal reflexes. No cranial nerve deficit. She exhibits normal muscle tone. Coordination normal.  Skin: Skin is warm and dry. No rash noted. No erythema. No pallor.  Some small brown nevi and angiomas fair  Psychiatric: She has a normal mood and affect.          Assessment & Plan:   Problem List Items Addressed This Visit      Cardiovascular and Mediastinum   Essential hypertension - Primary    bp in fair control at this time  BP Readings from Last 1 Encounters:  12/17/16 128/72   No changes needed Disc lifstyle change with low sodium diet and exercise  Labs today         Endocrine   Diabetes type 2, controlled ([place])    Lab Results  Component Value Date   HGBA1C 6.1 12/17/2016   This continues to be well controlled       Relevant Orders   Hemoglobin A1c (Completed)     Other   Anemia    Labs today  Taking her iron      Relevant Orders   Ferritin (Completed)   Grief reaction    Doing fairly well overall   PHQ9 score 4 and GAD7 score of 10 She declines need for counseling and has good coping skills Also spending more time getting out and on self care       Hyperlipidemia    Disc goals for lipids and reasons to control them Rev labs with pt (last check)  Rev low sat fat diet in detail Lipid panel today (diet controlled)        Welcome to Medicare preventive visit    Reviewed health habits including diet and exercise and skin cancer prevention Reviewed appropriate screening tests for age  Also reviewed health mt list, fam hx and immunization status , as well as social and family history   See HPI Labs drawn today  Given materials to work on power of attorney  Due for ppv23 in august- nurse appt made        Relevant Orders   CBC with Differential/Platelet (Completed)   Comprehensive metabolic panel (Completed)   Lipid panel (Completed)   TSH (Completed)

## 2016-12-18 NOTE — Assessment & Plan Note (Signed)
Lab Results  Component Value Date   HGBA1C 6.1 12/17/2016   This continues to be well controlled

## 2016-12-18 NOTE — Assessment & Plan Note (Signed)
bp in fair control at this time  BP Readings from Last 1 Encounters:  12/17/16 128/72   No changes needed Disc lifstyle change with low sodium diet and exercise  Labs today

## 2016-12-18 NOTE — Assessment & Plan Note (Signed)
Disc goals for lipids and reasons to control them Rev labs with pt (last check)  Rev low sat fat diet in detail Lipid panel today (diet controlled)

## 2016-12-18 NOTE — Assessment & Plan Note (Signed)
Labs today  Taking her iron

## 2016-12-18 NOTE — Assessment & Plan Note (Signed)
Reviewed health habits including diet and exercise and skin cancer prevention Reviewed appropriate screening tests for age  Also reviewed health mt list, fam hx and immunization status , as well as social and family history   See HPI Labs drawn today  Given materials to work on power of attorney  Due for ppv23 in august- nurse appt made

## 2016-12-18 NOTE — Assessment & Plan Note (Signed)
Doing fairly well overall   PHQ9 score 4 and GAD7 score of 10 She declines need for counseling and has good coping skills Also spending more time getting out and on self care

## 2017-01-12 ENCOUNTER — Telehealth: Payer: Self-pay | Admitting: *Deleted

## 2017-01-12 ENCOUNTER — Encounter: Payer: Self-pay | Admitting: Family Medicine

## 2017-01-12 MED ORDER — MECLIZINE HCL 25 MG PO TABS: 25.0000 mg | ORAL_TABLET | Freq: Three times a day (TID) | ORAL | 0 refills | Status: DC | PRN

## 2017-01-12 NOTE — Telephone Encounter (Signed)
Pt notified Rx sent 

## 2017-01-12 NOTE — Telephone Encounter (Signed)
Pt request a refill of I need a refill Meclizine Tabs 25mg . Pt said the bottle she has is out dated, but she uses it for her "inter ear dizziness". Pt uses [facility] on [address] in [place] (pharmacy), Rx isn't on current med list, please advise

## 2017-01-12 NOTE — Telephone Encounter (Signed)
Will refill electronically  

## 2017-02-07 ENCOUNTER — Other Ambulatory Visit: Payer: Self-pay | Admitting: Family Medicine

## 2017-02-26 ENCOUNTER — Ambulatory Visit (INDEPENDENT_AMBULATORY_CARE_PROVIDER_SITE_OTHER): Payer: Medicare Other

## 2017-02-26 DIAGNOSIS — Z23 Encounter for immunization: Secondary | ICD-10-CM | POA: Diagnosis not present

## 2017-03-23 ENCOUNTER — Encounter: Payer: Self-pay | Admitting: Family Medicine

## 2017-03-25 ENCOUNTER — Ambulatory Visit (INDEPENDENT_AMBULATORY_CARE_PROVIDER_SITE_OTHER): Payer: Medicare Other

## 2017-03-25 DIAGNOSIS — Z23 Encounter for immunization: Secondary | ICD-10-CM

## 2017-03-27 ENCOUNTER — Other Ambulatory Visit: Payer: Self-pay | Admitting: Family Medicine

## 2017-04-22 ENCOUNTER — Encounter: Payer: Self-pay | Admitting: Family Medicine

## 2017-04-29 ENCOUNTER — Ambulatory Visit
Admission: RE | Admit: 2017-04-29 | Discharge: 2017-04-29 | Disposition: A | Discharge status: Home / Self Care | Payer: Medicare Other | Source: Ambulatory Visit | Attending: Family Medicine | Admitting: Family Medicine

## 2017-04-29 DIAGNOSIS — Z1231 Encounter for screening mammogram for malignant neoplasm of breast: Secondary | ICD-10-CM

## 2017-04-30 ENCOUNTER — Encounter: Payer: Self-pay | Admitting: Family Medicine

## 2017-04-30 MED ORDER — AMITRIPTYLINE HCL 25 MG PO TABS: 25.0000 mg | ORAL_TABLET | Freq: Every day | ORAL | 3 refills | Status: DC

## 2017-04-30 MED ORDER — AMITRIPTYLINE HCL 25 MG PO TABS: 25.0000 mg | ORAL_TABLET | Freq: Every day | ORAL | 0 refills | Status: DC

## 2017-04-30 NOTE — Telephone Encounter (Signed)
Px sent

## 2017-06-16 ENCOUNTER — Telehealth: Payer: Self-pay | Admitting: Family Medicine

## 2017-06-16 NOTE — Telephone Encounter (Signed)
Gretta Cool, RN for [facility] called requesting information if the pt is on a statin; given information of note dated 01/06/17 "Due for labs No medicine- just eating a healthy diet".

## 2017-07-06 ENCOUNTER — Encounter: Payer: Self-pay | Admitting: Family Medicine

## 2017-07-06 ENCOUNTER — Ambulatory Visit: Payer: Medicare Other | Admitting: Internal Medicine

## 2017-07-06 ENCOUNTER — Ambulatory Visit: Payer: Medicare Other | Admitting: Family Medicine

## 2017-07-06 VITALS — BP 154/96 | HR 100 | Temp 97.9°F | Ht 59.25 in | Wt 159.5 lb

## 2017-07-06 DIAGNOSIS — M545 Low back pain, unspecified: Secondary | ICD-10-CM | POA: Insufficient documentation

## 2017-07-06 DIAGNOSIS — M5442 Lumbago with sciatica, left side: Secondary | ICD-10-CM

## 2017-07-06 DIAGNOSIS — I10 Essential (primary) hypertension: Secondary | ICD-10-CM

## 2017-07-06 LAB — POC URINALSYSI DIPSTICK (AUTOMATED)
BILIRUBIN UA: NEGATIVE
GLUCOSE UA: NEGATIVE
Ketones, UA: NEGATIVE
Leukocytes, UA: NEGATIVE
NITRITE UA: NEGATIVE
Protein, UA: NEGATIVE
Spec Grav, UA: 1.02 (ref 1.010–1.025)
Urobilinogen, UA: 0.2 E.U./dL
pH, UA: 6 (ref 5.0–8.0)

## 2017-07-06 MED ORDER — METHOCARBAMOL 500 MG PO TABS: 500.0000 mg | ORAL_TABLET | Freq: Three times a day (TID) | ORAL | 1 refills | Status: DC | PRN

## 2017-07-06 NOTE — Patient Instructions (Addendum)
I think you have a muscle spasm  Ibuprofen and tylenol are ok  Muscle relaxer with caution Take a look at stretches Do not sit still for too long  Slow walking is the best thing for this  We will refer to PT - and call you after the holiday   Update if not starting to improve in a week or if worsening    Get back on your blood pressure medicine

## 2017-07-06 NOTE — Assessment & Plan Note (Signed)
Suspect acute spasm  Ibuprofen prn/ tylenol prn  Given px for robaxin to use with caution of sedation  Heat/stretching Handouts on stretches and back pain given  No neuro def on exam  Ref to PT (will be after xmas) for eval and tx  Consider films is worse or no imp

## 2017-07-06 NOTE — Progress Notes (Signed)
Subjective:    Patient ID: Martha Hamilton, female    DOB: 1950-09-01, 66 y.o.   MRN: [id number]  HPI Here for back pain   Thursday was doing a lot of cooking  Was standing and stirring and sudden pain-had to get a chair to sit down to finish her task  Low back- L side worse than the R Felt like her L leg wanted to go out on her  Pain shot down to her knee on the outside   Pain is "like a cramp"   (she tends to be crampy and tight in all her muscles anyway)   When she does not move- much better  Best to sit in a recliner with legs up  Hurts to walk and stand   Very stiff and painful in the am   Used heat  Taking ibuprofen And tylenol - on and off    This am =pain in L lateral calf   She had tailbone fx in the 4th grade-has always had problems on and off since then     bp is up BP Readings from Last 3 Encounters:  07/06/17 (!) 154/96  12/17/16 128/72  09/08/16 140/70  has not had her medicine today   Urine  Results for orders placed or performed in visit on 07/06/17  POCT Urinalysis Dipstick (Automated)  Result Value Ref Range   Color, UA Yellow    Clarity, UA Clear    Glucose, UA Negative    Bilirubin, UA Negative    Ketones, UA Negative    Spec Grav, UA 1.020 1.010 - 1.025   Blood, UA 10 Ery/uL    pH, UA 6.0 5.0 - 8.0   Protein, UA Negative    Urobilinogen, UA 0.2 0.2 or 1.0 E.U./dL   Nitrite, UA Negative    Leukocytes, UA Negative Negative       Review of Systems  Constitutional: Negative for activity change, appetite change, fatigue, fever and unexpected weight change.  HENT: Negative for congestion, ear pain, rhinorrhea, sinus pressure and sore throat.   Eyes: Negative for pain, redness and visual disturbance.  Respiratory: Negative for cough, shortness of breath and wheezing.   Cardiovascular: Negative for chest pain and palpitations.  Gastrointestinal: Negative for abdominal pain, blood in stool, constipation and diarrhea.  Endocrine: Negative for  polydipsia and polyuria.  Genitourinary: Negative for dysuria, frequency and urgency.  Musculoskeletal: Positive for back pain. Negative for arthralgias and myalgias.  Skin: Negative for pallor and rash.  Allergic/Immunologic: Negative for environmental allergies.  Neurological: Negative for dizziness, syncope, weakness and headaches.  Hematological: Negative for adenopathy. Does not bruise/bleed easily.  Psychiatric/Behavioral: Negative for decreased concentration and dysphoric mood. The patient is not nervous/anxious.        Objective:   Physical Exam  Constitutional: She appears well-developed and well-nourished. No distress.  obese and well appearing   HENT:  Head: Normocephalic and atraumatic.  Eyes: Conjunctivae and EOM are normal. Pupils are equal, round, and reactive to light. No scleral icterus.  Neck: Normal range of motion. Neck supple.  Cardiovascular: Normal rate and regular rhythm.  Pulmonary/Chest: Effort normal and breath sounds normal. She has no wheezes. She has no rales.  Abdominal: Soft. Bowel sounds are normal. She exhibits no distension. There is no tenderness.  Musculoskeletal: She exhibits tenderness.       Lumbar back: She exhibits decreased range of motion, tenderness and spasm. She exhibits no bony tenderness and no edema.  Tender over L lumbar musculature  with palpable spasm  No bony tenderness Flex 80 deg Ext-full Pain with lat bend both ways Nl gait  Neg slr  No neuro changes   Lymphadenopathy:    She has no cervical adenopathy.  Neurological: She is alert. She has normal strength and normal reflexes. She displays no atrophy. No cranial nerve deficit or sensory deficit. She exhibits normal muscle tone. Coordination normal.  Negative SLR  Skin: Skin is warm and dry. No rash noted. No erythema. No pallor.  Psychiatric: She has a normal mood and affect.          Assessment & Plan:   Problem List Items Addressed This Visit      Cardiovascular  and Mediastinum   Essential hypertension    Forgot medication this am  BP: (!) 154/96    Is usually well controlled  Will go home to take it now         Other   Low back pain - Primary    Suspect acute spasm  Ibuprofen prn/ tylenol prn  Given px for robaxin to use with caution of sedation  Heat/stretching Handouts on stretches and back pain given  No neuro def on exam  Ref to PT (will be after xmas) for eval and tx  Consider films is worse or no imp      Relevant Medications   methocarbamol (ROBAXIN) 500 MG tablet   Other Relevant Orders   Ambulatory referral to Physical Therapy   POCT Urinalysis Dipstick (Automated) (Completed)

## 2017-07-06 NOTE — Assessment & Plan Note (Signed)
Forgot medication this am  BP: (!) 154/96    Is usually well controlled  Will go home to take it now

## 2017-09-21 ENCOUNTER — Ambulatory Visit: Payer: Medicare Other | Admitting: Family Medicine

## 2017-09-21 ENCOUNTER — Ambulatory Visit (INDEPENDENT_AMBULATORY_CARE_PROVIDER_SITE_OTHER)
Admission: RE | Admit: 2017-09-21 | Discharge: 2017-09-21 | Disposition: A | Discharge status: Home / Self Care | Payer: Medicare Other | Source: Ambulatory Visit | Attending: Family Medicine | Admitting: Family Medicine

## 2017-09-21 ENCOUNTER — Encounter: Payer: Self-pay | Admitting: Family Medicine

## 2017-09-21 VITALS — BP 128/78 | HR 104 | Temp 98.3°F | Ht 59.25 in | Wt 163.8 lb

## 2017-09-21 DIAGNOSIS — M25562 Pain in left knee: Secondary | ICD-10-CM

## 2017-09-21 DIAGNOSIS — E119 Type 2 diabetes mellitus without complications: Secondary | ICD-10-CM

## 2017-09-21 MED ORDER — MELOXICAM 15 MG PO TABS: 15.0000 mg | ORAL_TABLET | Freq: Every day | ORAL | 0 refills | Status: DC

## 2017-09-21 NOTE — Assessment & Plan Note (Signed)
Has been well controlled. 

## 2017-09-21 NOTE — Patient Instructions (Signed)
Avoid doing things that hurt knee Elevate it when able  Ice when able- 10 minutes on and off   Tylenol is ok  Take the meloxicam daily with food (for pain and inflammation)   Xray now  We will update you with a result

## 2017-09-21 NOTE — Progress Notes (Signed)
Subjective:    Patient ID: Martha Hamilton, female    DOB: [birth year], 67 y.o.   MRN: [id number]  HPI Here for L knee pain   Wt Readings from Last 3 Encounters:  09/21/17 163 lb 12 oz (74.3 kg)  07/06/17 159 lb 8 oz (72.3 kg)  12/17/16 159 lb 4 oz (72.2 kg)   32.79 kg/m   Hurts to bend or twist knee  (even in bed)  Hurts since Friday  Swollen over the medial side -that has improved  Feels unstable at times  No trauma  No new exercise or activity (besides her PT) --mostly stretching for her muscles  Was looking forward to getting out to walk in better weather   Most of her pain is medial or just under knee cap  Deep ache    Has OA in other areas    She does PT exercises - this is making it hard to do her exercises   Patient Active Problem List   Diagnosis Date Noted  . Left knee pain 09/21/2017  . Low back pain 07/06/2017  . Welcome to Medicare preventive visit 12/17/2016  . Grief reaction 12/17/2016  . Estrogen deficiency 09/08/2016  . Pre-syncope 06/11/2016  . Muscle pain 02/08/2016  . Anemia 02/09/2013  . Abnormal EKG 07/27/2012  . Special screening for malignant neoplasms, colon 04/29/2011  . Gynecological examination 04/29/2011  . Routine general medical examination at a health care facility 04/20/2011  . OSTEOARTHRITIS, HANDS, BILATERAL 10/31/2008  . Diabetes type 2, controlled ([place]) 07/10/2008  . Hyperlipidemia 01/26/2008  . Essential hypertension 01/26/2008  . ALLERGIC RHINITIS 12/07/2007   Past Medical History:  Diagnosis Date  . Allergic rhinitis   . Breast cancer ([place]) 1996   right breast  . DM2 (diabetes mellitus, type 2) ([place])   . GERD (gastroesophageal reflux disease)   . Hx of cardiovascular stress test    a. Lex MV 2/14:  EF 69%, no ischemia  . Hyperlipidemia   . Hypertension   . Iron deficiency anemia   . Obesity   . Osteoarthritis    hands  . Ulcer    Past Surgical History:  Procedure Laterality Date  . APPENDECTOMY  1976  . BREAST  LUMPECTOMY Right 1996   right breast, lumpectomy w/nodes  . GASTRIC RESTRICTION SURGERY     for reflux  . HYSTEROSCOPY W/D&C N/A 10/18/2012   Procedure: DILATATION AND CURETTAGE /HYSTEROSCOPY;  Surgeon: Mora Bellman, MD;  Location: [facility];  Service: Gynecology;  Laterality: N/A;  . POLYPECTOMY N/A 10/18/2012   Procedure: POLYPECTOMY;  Surgeon: Mora Bellman, MD;  Location: [facility];  Service: Gynecology;  Laterality: N/A;   Social History   Tobacco Use  . Smoking status: Never Smoker  . Smokeless tobacco: Never Used  Substance Use Topics  . Alcohol use: Not on file  . Drug use: No   Family History  Problem Relation Age of Onset  . Diabetes Mother   . Heart disease Mother        Pacemaker, CHF  . Hypertension Mother   . Kidney cancer Mother   . Stroke Father   . CAD Father 5       Died with MI  . Esophageal cancer Brother   . Heart disease Sister   . Diabetes Sister   . Colon cancer Neg Hx   . Rectal cancer Neg Hx   . Stomach cancer Neg Hx    Allergies  Allergen Reactions  . Cholestyramine Other (See Comments)  REACTION: Muscles tightened up, drew up.  . Lipitor [Atorvastatin] Other (See Comments)    Muscle cramping  . Niacin Nausea And Vomiting  . Simvastatin     Muscular pain   Current Outpatient Medications on File Prior to Visit  Medication Sig Dispense Refill  . acetaminophen (TYLENOL) 500 MG tablet Take 500 mg by mouth every 6 (six) hours as needed for mild pain.     Marland Kitchen amitriptyline (ELAVIL) 25 MG tablet Take 1 tablet (25 mg total) by mouth at bedtime. 90 tablet 3  . aspirin 81 MG tablet Take 81 mg by mouth daily.    . Cholecalciferol (VITAMIN D) 1000 UNITS capsule Take 2,000 Units by mouth daily.     . ferrous sulfate 325 (65 FE) MG tablet take 1 tablet by mouth twice a day with meals 60 tablet 6  . glipiZIDE (GLUCOTROL XL) 2.5 MG 24 hr tablet TAKE 1 TABLET BY MOUTH  EVERY EVENING 90 tablet 2  . hydrochlorothiazide (HYDRODIURIL) 25 MG tablet TAKE 1 TABLET BY  MOUTH  DAILY 90 tablet 2  . losartan (COZAAR) 100 MG tablet TAKE 1 TABLET BY MOUTH  DAILY 90 tablet 2  . meclizine (ANTIVERT) 25 MG tablet Take 1 tablet (25 mg total) by mouth 3 (three) times daily as needed. 90 tablet 0  . metFORMIN (GLUCOPHAGE) 1000 MG tablet TAKE 1 TABLET BY MOUTH TWO  TIMES DAILY WITH A MEAL 180 tablet 2  . Omega-3 Fatty Acids (FISH OIL) 1200 MG CAPS Take 1 capsule by mouth daily.    Marland Kitchen omeprazole (PRILOSEC) 20 MG capsule TAKE 1 CAPSULE BY MOUTH  DAILY 90 capsule 2  . ONETOUCH DELICA LANCETS 25Z MISC Check blood sugar once  daily and as directed. Dx  E11.9 100 each 3  . ONETOUCH VERIO test strip TEST BLOOD SUGAR ONCE DAILY FOR DIABETES MELLITUS AS AS NEEDED. 100 each 3  . SALINE NASAL SPRAY NA Place 1 spray into the nose daily.      No current facility-administered medications on file prior to visit.     Review of Systems  Constitutional: Negative for activity change, appetite change, fatigue, fever and unexpected weight change.  HENT: Negative for congestion, ear pain, rhinorrhea, sinus pressure and sore throat.   Eyes: Negative for pain, redness and visual disturbance.  Respiratory: Negative for cough, shortness of breath and wheezing.   Cardiovascular: Negative for chest pain and palpitations.  Gastrointestinal: Negative for abdominal pain, blood in stool, constipation and diarrhea.  Endocrine: Negative for polydipsia and polyuria.  Genitourinary: Negative for dysuria, frequency and urgency.  Musculoskeletal: Positive for back pain. Negative for myalgias.       L knee pain and stiffness  Skin: Negative for pallor and rash.  Allergic/Immunologic: Negative for environmental allergies.  Neurological: Negative for dizziness, syncope and headaches.  Hematological: Negative for adenopathy. Does not bruise/bleed easily.  Psychiatric/Behavioral: Negative for decreased concentration and dysphoric mood. The patient is not nervous/anxious.        Objective:   Physical Exam   Constitutional: She appears well-developed and well-nourished. No distress.  obese and well appearing   HENT:  Head: Normocephalic and atraumatic.  Mouth/Throat: Oropharynx is clear and moist.  Eyes: Conjunctivae and EOM are normal. Pupils are equal, round, and reactive to light. No scleral icterus.  Neck: Normal range of motion. Neck supple.  Cardiovascular: Normal rate and normal heart sounds.  Pulmonary/Chest: Effort normal and breath sounds normal. No respiratory distress. She has no wheezes. She has no rales.  Musculoskeletal: She exhibits tenderness. She exhibits no edema.       Left knee: She exhibits decreased range of motion and bony tenderness. She exhibits no swelling, no effusion, no ecchymosis, no deformity, no erythema, normal alignment, no LCL laxity and normal patellar mobility. Tenderness found. Medial joint line and patellar tendon tenderness noted.  L knee- pain to flex past 30 deg  No obvious swelling /effusion or warmth  No crepitus  Neg patellar grind Tender over proximal patellar tendon and medial joint line   Nl ant drawer/lachman Pain with mcmurry   Pain with gait    Lymphadenopathy:    She has no cervical adenopathy.  Neurological: She displays no atrophy. No sensory deficit. She exhibits normal muscle tone.          Assessment & Plan:   Problem List Items Addressed This Visit      Endocrine   Diabetes type 2, controlled ([place])    Has been well controlled         Other   Left knee pain - Primary    Suspect poss internal derangement (? Overuse injury from PT) vs OA  xr today-pending  Ice /elevation Relative rest from things that hurt  meloxicam 15 mg daily with food Acetaminophen prn        Relevant Orders   DG Knee 4 Views W/Patella Left (Completed)

## 2017-09-21 NOTE — Assessment & Plan Note (Signed)
Suspect poss internal derangement (? Overuse injury from PT) vs OA  xr today-pending  Ice /elevation Relative rest from things that hurt  meloxicam 15 mg daily with food Acetaminophen prn

## 2017-10-05 ENCOUNTER — Telehealth: Payer: Self-pay | Admitting: Family Medicine

## 2017-10-05 DIAGNOSIS — M25562 Pain in left knee: Secondary | ICD-10-CM

## 2017-10-05 NOTE — Telephone Encounter (Signed)
Copied from [place] [id number]. Topic: Referral - Request >> Oct 05, 2017  2:59 PM Hewitt Shorts wrote: CRM for notification. See Telephone encounter for: 10/05/17.pt states that she is still having left knee pain and would like to go ahead with the referral that Dr. Glori Bickers is recommending   Best number is [phone]

## 2017-10-05 NOTE — Telephone Encounter (Signed)
Pt was seen and has xray of lt knee on 09/21/17.Please advise.

## 2017-10-05 NOTE — Telephone Encounter (Signed)
I did a referral to orthopedics for the pain and arthritic/degenerative change seen on xray   Please mail her a copy of the xray report if she wants one Thanks   Will cc to [facility]

## 2017-10-06 ENCOUNTER — Encounter: Payer: Self-pay | Admitting: Family Medicine

## 2017-10-09 DIAGNOSIS — M179 Osteoarthritis of knee, unspecified: Secondary | ICD-10-CM | POA: Insufficient documentation

## 2017-10-17 ENCOUNTER — Other Ambulatory Visit: Payer: Self-pay | Admitting: Family Medicine

## 2017-10-19 NOTE — Telephone Encounter (Signed)
CPE scheduled on 12/25/17, last filled on 09/21/17 #30 with 0 refills, this refill request is asking for a 90 day supply, please advise

## 2017-10-29 ENCOUNTER — Encounter: Payer: Self-pay | Admitting: Family Medicine

## 2017-10-29 ENCOUNTER — Ambulatory Visit: Payer: Medicare Other | Admitting: Family Medicine

## 2017-10-29 DIAGNOSIS — J209 Acute bronchitis, unspecified: Secondary | ICD-10-CM | POA: Diagnosis not present

## 2017-10-29 MED ORDER — PROMETHAZINE-DM 6.25-15 MG/5ML PO SYRP: 5.0000 mL | ORAL_SOLUTION | Freq: Four times a day (QID) | ORAL | 0 refills | Status: DC | PRN

## 2017-10-29 MED ORDER — DOXYCYCLINE HYCLATE 100 MG PO TABS: 100.0000 mg | ORAL_TABLET | Freq: Two times a day (BID) | ORAL | 0 refills | Status: DC

## 2017-10-29 MED ORDER — ALBUTEROL SULFATE HFA 108 (90 BASE) MCG/ACT IN AERS: 2.0000 | INHALATION_SPRAY | RESPIRATORY_TRACT | 0 refills | Status: DC | PRN

## 2017-10-29 MED ORDER — BENZONATATE 200 MG PO CAPS
200.0000 mg | ORAL_CAPSULE | Freq: Three times a day (TID) | ORAL | 1 refills | Status: DC | PRN
Start: 2017-10-29 — End: 2017-12-21

## 2017-10-29 NOTE — Patient Instructions (Addendum)
Get some mucinex or robitussin (expectorant)- plain for congestion and cough   I think you have a viral upper respiratory infection with bronchitis   Try the tessalon for cough  Try the prometh-DM for cough  Tylenol as needed for pain or fever  Lots of fluids Lots of rest  claritin for runny nose if needed    If worse - fill the doxycycline and start it  If wheezing = fill the inhaler  Update if not starting to improve in a week or if worsening

## 2017-10-29 NOTE — Progress Notes (Signed)
Subjective:    Patient ID: Martha Hamilton, female    DOB: August 24, 1950, 67 y.o.   MRN: [id number]  HPI Here for uri symptoms incl cough/hoarse/congestion   Symptoms started Monday - nasal congestion  Bad hoarseness Tuesday  Yesterday -deep cough (rattly but cannot get phlegm) Post nasal drip  Nasal d/c is yellow   Temp: 98.8 F (37.1 C)   At home -low grade temp  Taking tylenol  No other otc meds  No allergy medicines    Patient Active Problem List   Diagnosis Date Noted  . Acute bronchitis 10/29/2017  . Left knee pain 09/21/2017  . Low back pain 07/06/2017  . Welcome to Medicare preventive visit 12/17/2016  . Grief reaction 12/17/2016  . Estrogen deficiency 09/08/2016  . Pre-syncope 06/11/2016  . Muscle pain 02/08/2016  . Anemia 02/09/2013  . Abnormal EKG 07/27/2012  . Special screening for malignant neoplasms, colon 04/29/2011  . Gynecological examination 04/29/2011  . Routine general medical examination at a health care facility 04/20/2011  . OSTEOARTHRITIS, HANDS, BILATERAL 10/31/2008  . Diabetes type 2, controlled ([place]) 07/10/2008  . Hyperlipidemia 01/26/2008  . Essential hypertension 01/26/2008  . ALLERGIC RHINITIS 12/07/2007   Past Medical History:  Diagnosis Date  . Allergic rhinitis   . Breast cancer ([place]) 1996   right breast  . DM2 (diabetes mellitus, type 2) ([place])   . GERD (gastroesophageal reflux disease)   . Hx of cardiovascular stress test    a. Lex MV 2/14:  EF 69%, no ischemia  . Hyperlipidemia   . Hypertension   . Iron deficiency anemia   . Obesity   . Osteoarthritis    hands  . Ulcer    Past Surgical History:  Procedure Laterality Date  . APPENDECTOMY  1976  . BREAST LUMPECTOMY Right 1996   right breast, lumpectomy w/nodes  . GASTRIC RESTRICTION SURGERY     for reflux  . HYSTEROSCOPY W/D&C N/A 10/18/2012   Procedure: DILATATION AND CURETTAGE /HYSTEROSCOPY;  Surgeon: Mora Bellman, MD;  Location: [facility];  Service: Gynecology;   Laterality: N/A;  . POLYPECTOMY N/A 10/18/2012   Procedure: POLYPECTOMY;  Surgeon: Mora Bellman, MD;  Location: [facility];  Service: Gynecology;  Laterality: N/A;   Social History   Tobacco Use  . Smoking status: Never Smoker  . Smokeless tobacco: Never Used  Substance Use Topics  . Alcohol use: Not on file  . Drug use: No   Family History  Problem Relation Age of Onset  . Diabetes Mother   . Heart disease Mother        Pacemaker, CHF  . Hypertension Mother   . Kidney cancer Mother   . Stroke Father   . CAD Father 53       Died with MI  . Esophageal cancer Brother   . Heart disease Sister   . Diabetes Sister   . Colon cancer Neg Hx   . Rectal cancer Neg Hx   . Stomach cancer Neg Hx    Allergies  Allergen Reactions  . Cholestyramine Other (See Comments)    REACTION: Muscles tightened up, drew up.  . Lipitor [Atorvastatin] Other (See Comments)    Muscle cramping  . Niacin Nausea And Vomiting  . Simvastatin     Muscular pain   Current Outpatient Medications on File Prior to Visit  Medication Sig Dispense Refill  . acetaminophen (TYLENOL) 500 MG tablet Take 500 mg by mouth every 6 (six) hours as needed for mild pain.     Marland Kitchen  amitriptyline (ELAVIL) 25 MG tablet Take 1 tablet (25 mg total) by mouth at bedtime. 90 tablet 3  . aspirin 81 MG tablet Take 81 mg by mouth daily.    . Cholecalciferol (VITAMIN D) 1000 UNITS capsule Take 2,000 Units by mouth daily.     . ferrous sulfate 325 (65 FE) MG tablet take 1 tablet by mouth twice a day with meals 60 tablet 6  . glipiZIDE (GLUCOTROL XL) 2.5 MG 24 hr tablet TAKE 1 TABLET BY MOUTH  EVERY EVENING 90 tablet 2  . hydrochlorothiazide (HYDRODIURIL) 25 MG tablet TAKE 1 TABLET BY MOUTH  DAILY 90 tablet 2  . losartan (COZAAR) 100 MG tablet TAKE 1 TABLET BY MOUTH  DAILY 90 tablet 2  . meclizine (ANTIVERT) 25 MG tablet Take 1 tablet (25 mg total) by mouth 3 (three) times daily as needed. 90 tablet 0  . meloxicam (MOBIC) 15 MG tablet TAKE 1  TABLET(15 MG) BY MOUTH DAILY WITH A MEAL FOR KNEE PAIN 90 tablet 0  . metFORMIN (GLUCOPHAGE) 1000 MG tablet TAKE 1 TABLET BY MOUTH TWO  TIMES DAILY WITH A MEAL 180 tablet 2  . Omega-3 Fatty Acids (FISH OIL) 1200 MG CAPS Take 1 capsule by mouth daily.    Marland Kitchen omeprazole (PRILOSEC) 20 MG capsule TAKE 1 CAPSULE BY MOUTH  DAILY 90 capsule 2  . ONETOUCH DELICA LANCETS 68T MISC Check blood sugar once  daily and as directed. Dx  E11.9 100 each 3  . ONETOUCH VERIO test strip TEST BLOOD SUGAR ONCE DAILY FOR DIABETES MELLITUS AS AS NEEDED. 100 each 3  . SALINE NASAL SPRAY NA Place 1 spray into the nose daily.      No current facility-administered medications on file prior to visit.     Review of Systems  Constitutional: Positive for appetite change and fatigue. Negative for fever.  HENT: Positive for congestion, postnasal drip, rhinorrhea, sinus pressure, sneezing and sore throat. Negative for ear pain.   Eyes: Negative for pain and discharge.  Respiratory: Positive for cough. Negative for shortness of breath, wheezing and stridor.   Cardiovascular: Negative for chest pain.  Gastrointestinal: Negative for diarrhea, nausea and vomiting.  Genitourinary: Negative for frequency, hematuria and urgency.  Musculoskeletal: Negative for arthralgias and myalgias.  Skin: Negative for rash.  Neurological: Positive for headaches. Negative for dizziness, weakness and light-headedness.  Psychiatric/Behavioral: Negative for confusion and dysphoric mood.       Objective:   Physical Exam  Constitutional: She appears well-developed and well-nourished. No distress.  Well appearing   HENT:  Head: Normocephalic and atraumatic.  Right Ear: External ear normal.  Left Ear: External ear normal.  Mouth/Throat: Oropharynx is clear and moist.  Nares are injected and congested  No sinus tenderness Clear rhinorrhea and post nasal drip   Hoarse voice   Eyes: Pupils are equal, round, and reactive to light. Conjunctivae and  EOM are normal. Right eye exhibits no discharge. Left eye exhibits no discharge.  Neck: Normal range of motion. Neck supple.  Cardiovascular: Normal rate and normal heart sounds.  Pulmonary/Chest: Effort normal and breath sounds normal. No respiratory distress. She has no wheezes. She has no rales. She exhibits no tenderness.  Harsh bs with scattered rhonchi Good air exch  Lymphadenopathy:    She has no cervical adenopathy.  Neurological: She is alert.  Skin: Skin is warm and dry. No rash noted.  Psychiatric: She has a normal mood and affect.          Assessment &  Plan:   Problem List Items Addressed This Visit      Respiratory   Acute bronchitis    With wet cough/persistent and rhonchi on exam   Disc symptomatic care - see instructions on AVS  Avs:  think you have a viral upper respiratory infection with bronchitis   Try the tessalon for cough  Try the prometh-DM for cough  Tylenol as needed for pain or fever  Lots of fluids Lots of rest  claritin for runny nose if needed    If worse - fill the doxycycline and start it  If wheezing = fill the inhaler  Update if not starting to improve in a week or if worsening

## 2017-10-30 NOTE — Assessment & Plan Note (Signed)
With wet cough/persistent and rhonchi on exam   Disc symptomatic care - see instructions on AVS  Avs:  think you have a viral upper respiratory infection with bronchitis   Try the tessalon for cough  Try the prometh-DM for cough  Tylenol as needed for pain or fever  Lots of fluids Lots of rest  claritin for runny nose if needed    If worse - fill the doxycycline and start it  If wheezing = fill the inhaler  Update if not starting to improve in a week or if worsening

## 2017-11-06 ENCOUNTER — Other Ambulatory Visit: Payer: Self-pay | Admitting: Family Medicine

## 2017-12-20 ENCOUNTER — Telehealth: Payer: Self-pay | Admitting: Family Medicine

## 2017-12-20 DIAGNOSIS — I10 Essential (primary) hypertension: Secondary | ICD-10-CM

## 2017-12-20 DIAGNOSIS — E7800 Pure hypercholesterolemia, unspecified: Secondary | ICD-10-CM

## 2017-12-20 DIAGNOSIS — E119 Type 2 diabetes mellitus without complications: Secondary | ICD-10-CM

## 2017-12-20 DIAGNOSIS — D649 Anemia, unspecified: Secondary | ICD-10-CM

## 2017-12-20 NOTE — Telephone Encounter (Signed)
-----   Message from Eustace Pen, LPN sent at 08/21/32  3:59 PM EDT ----- Regarding: Labs 6/10 Lab orders needed. Thank you.  Insurance:  [facility]

## 2017-12-21 ENCOUNTER — Ambulatory Visit (INDEPENDENT_AMBULATORY_CARE_PROVIDER_SITE_OTHER): Payer: Medicare Other

## 2017-12-21 VITALS — BP 146/90 | HR 115 | Temp 98.6°F | Ht 59.5 in | Wt 157.8 lb

## 2017-12-21 DIAGNOSIS — S83249A Other tear of medial meniscus, current injury, unspecified knee, initial encounter: Secondary | ICD-10-CM | POA: Insufficient documentation

## 2017-12-21 DIAGNOSIS — M25562 Pain in left knee: Secondary | ICD-10-CM | POA: Insufficient documentation

## 2017-12-21 DIAGNOSIS — E7800 Pure hypercholesterolemia, unspecified: Secondary | ICD-10-CM | POA: Diagnosis not present

## 2017-12-21 DIAGNOSIS — I10 Essential (primary) hypertension: Secondary | ICD-10-CM

## 2017-12-21 DIAGNOSIS — Z0000 Encounter for general adult medical examination without abnormal findings: Secondary | ICD-10-CM | POA: Diagnosis not present

## 2017-12-21 DIAGNOSIS — D649 Anemia, unspecified: Secondary | ICD-10-CM

## 2017-12-21 DIAGNOSIS — E119 Type 2 diabetes mellitus without complications: Secondary | ICD-10-CM

## 2017-12-21 LAB — CBC WITH DIFFERENTIAL/PLATELET
BASOS ABS: 0.1 10*3/uL (ref 0.0–0.1)
Basophils Relative: 1.3 % (ref 0.0–3.0)
EOS ABS: 0.3 10*3/uL (ref 0.0–0.7)
Eosinophils Relative: 2.8 % (ref 0.0–5.0)
HEMATOCRIT: 33.8 % — AB (ref 36.0–46.0)
HEMOGLOBIN: 11.2 g/dL — AB (ref 12.0–15.0)
Lymphocytes Relative: 28.7 % (ref 12.0–46.0)
Lymphs Abs: 2.6 10*3/uL (ref 0.7–4.0)
MCHC: 33.3 g/dL (ref 30.0–36.0)
MCV: 81.1 fl (ref 78.0–100.0)
Monocytes Absolute: 0.7 10*3/uL (ref 0.1–1.0)
Monocytes Relative: 7.9 % (ref 3.0–12.0)
Neutro Abs: 5.4 10*3/uL (ref 1.4–7.7)
Neutrophils Relative %: 59.3 % (ref 43.0–77.0)
PLATELETS: 547 10*3/uL — AB (ref 150.0–400.0)
RBC: 4.16 Mil/uL (ref 3.87–5.11)
RDW: 15.4 % (ref 11.5–15.5)
WBC: 9.1 10*3/uL (ref 4.0–10.5)

## 2017-12-21 LAB — LIPID PANEL
Cholesterol: 225 mg/dL — ABNORMAL HIGH (ref 0–200)
HDL: 47.4 mg/dL (ref >39.00)
Total CHOL/HDL Ratio: 5

## 2017-12-21 LAB — LDL CHOLESTEROL, DIRECT: LDL DIRECT: 102 mg/dL

## 2017-12-21 LAB — COMPREHENSIVE METABOLIC PANEL
ALBUMIN: 4.4 g/dL (ref 3.5–5.2)
ALT: 16 U/L (ref 0–35)
AST: 15 U/L (ref 0–37)
Alkaline Phosphatase: 49 U/L (ref 39–117)
BUN: 25 mg/dL — AB (ref 6–23)
CHLORIDE: 98 meq/L (ref 96–112)
CO2: 28 mEq/L (ref 19–32)
CREATININE: 0.76 mg/dL (ref 0.40–1.20)
Calcium: 10.1 mg/dL (ref 8.4–10.5)
GFR: 80.7 mL/min (ref >60.00)
GLUCOSE: 95 mg/dL (ref 70–99)
Potassium: 4.4 mEq/L (ref 3.5–5.1)
SODIUM: 137 meq/L (ref 135–145)
TOTAL PROTEIN: 8.1 g/dL (ref 6.0–8.3)
Total Bilirubin: 0.3 mg/dL (ref 0.2–1.2)

## 2017-12-21 LAB — FERRITIN: Ferritin: 8 ng/mL — ABNORMAL LOW (ref 10.0–291.0)

## 2017-12-21 LAB — HEMOGLOBIN A1C: Hgb A1c MFr Bld: 6.4 % (ref 4.6–6.5)

## 2017-12-21 LAB — TSH: TSH: 1.05 u[IU]/mL (ref 0.35–4.50)

## 2017-12-21 NOTE — Patient Instructions (Signed)
Martha Hamilton , Thank you for taking time to come for your Medicare Wellness Visit. I appreciate your ongoing commitment to your health goals. Please review the following plan we discussed and let me know if I can assist you in the future.   These are the goals we discussed: Goals    . DIET - INCREASE WATER INTAKE     Starting 12/21/2017, I will continue to drink at least 6-8 glasses of water daily.        This is a list of the screening recommended for you and due dates:  Health Maintenance  Topic Date Due  . Complete foot exam   12/25/2017*  . Eye exam for diabetics  07/13/2018*  . Stool Blood Test  08/15/2020*  .  Hepatitis C: One time screening is recommended by Center for Disease Control  (CDC) for  adults born from [birth year] through 1965.   08/15/2020*  . Flu Shot  02/11/2018  . Mammogram  04/29/2018  . Hemoglobin A1C  06/22/2018  . Tetanus Vaccine  02/10/2019  . Colon Cancer Screening  05/07/2023  . DEXA scan (bone density measurement)  Completed  . Pneumonia vaccines  Completed  *Topic was postponed. The date shown is not the original due date.   Preventive Care for Adults  A healthy lifestyle and preventive care can promote health and wellness. Preventive health guidelines for adults include the following key practices.  . A routine yearly physical is a good way to check with your health care provider about your health and preventive screening. It is a chance to share any concerns and updates on your health and to receive a thorough exam.  . Visit your dentist for a routine exam and preventive care every 6 months. Brush your teeth twice a day and floss once a day. Good oral hygiene prevents tooth decay and gum disease.  . The frequency of eye exams is based on your age, health, family medical history, use  of contact lenses, and other factors. Follow your health care provider's recommendations for frequency of eye exams.  . Eat a healthy diet. Foods like vegetables, fruits, whole  grains, low-fat dairy products, and lean protein foods contain the nutrients you need without too many calories. Decrease your intake of foods high in solid fats, added sugars, and salt. Eat the right amount of calories for you. Get information about a proper diet from your health care provider, if necessary.  . Regular physical exercise is one of the most important things you can do for your health. Most adults should get at least 150 minutes of moderate-intensity exercise (any activity that increases your heart rate and causes you to sweat) each week. In addition, most adults need muscle-strengthening exercises on 2 or more days a week.  Silver Sneakers may be a benefit available to you. To determine eligibility, you may visit the website: www.silversneakers.com or contact program at (779)503-6505 Mon-Fri between 8AM-8PM.   . Maintain a healthy weight. The body mass index (BMI) is a screening tool to identify possible weight problems. It provides an estimate of body fat based on height and weight. Your health care provider can find your BMI and can help you achieve or maintain a healthy weight.   For adults 20 years and older: ? A BMI below 18.5 is considered underweight. ? A BMI of 18.5 to 24.9 is normal. ? A BMI of 25 to 29.9 is considered overweight. ? A BMI of 30 and above is considered obese.   Marland Kitchen  Maintain normal blood lipids and cholesterol levels by exercising and minimizing your intake of saturated fat. Eat a balanced diet with plenty of fruit and vegetables. Blood tests for lipids and cholesterol should begin at age 79 and be repeated every 5 years. If your lipid or cholesterol levels are high, you are over 50, or you are at high risk for heart disease, you may need your cholesterol levels checked more frequently. Ongoing high lipid and cholesterol levels should be treated with medicines if diet and exercise are not working.  . If you smoke, find out from your health care provider how to  quit. If you do not use tobacco, please do not start.  . If you choose to drink alcohol, please do not consume more than 2 drinks per day. One drink is considered to be 12 ounces (355 mL) of beer, 5 ounces (148 mL) of wine, or 1.5 ounces (44 mL) of liquor.  . If you are 88-44 years old, ask your health care provider if you should take aspirin to prevent strokes.  . Use sunscreen. Apply sunscreen liberally and repeatedly throughout the day. You should seek shade when your shadow is shorter than you. Protect yourself by wearing long sleeves, pants, a wide-brimmed hat, and sunglasses year round, whenever you are outdoors.  . Once a month, do a whole body skin exam, using a mirror to look at the skin on your back. Tell your health care provider of new moles, moles that have irregular borders, moles that are larger than a pencil eraser, or moles that have changed in shape or color.

## 2017-12-21 NOTE — Progress Notes (Signed)
Subjective:   Martha Hamilton is a 67 y.o. female who presents for an Initial Medicare Annual Wellness Visit.  Review of Systems  N/A Cardiac Risk Factors include: advanced age (>70men, >60 women)    Objective:    Today's Vitals   12/21/17 0914  BP: (!) 146/90  Pulse: (!) 115  Temp: 98.6 F (37 C)  TempSrc: Oral  SpO2: 93%  Weight: 157 lb 12 oz (71.6 kg)  Height: 4' 11.5" (1.511 m)  PainSc: 7   PainLoc: Knee   Body mass index is 31.33 kg/m.  Advanced Directives 12/21/2017 06/24/2016 10/12/2012 07/26/2012  Does Patient Have a Medical Advance Directive? Yes Yes Patient has advance directive, copy in chart Patient has advance directive, copy not in chart  Type of Advance Directive [place];Living will Living will - [place];Living will  Copy of [place] in Chart? No - copy requested - - -    Current Medications (verified) Outpatient Encounter Medications as of 12/21/2017  Medication Sig  . acetaminophen (TYLENOL) 500 MG tablet Take 500 mg by mouth every 6 (six) hours as needed for mild pain.   Marland Kitchen albuterol (PROVENTIL HFA;VENTOLIN HFA) 108 (90 Base) MCG/ACT inhaler Inhale 2 puffs into the lungs every 4 (four) hours as needed for wheezing.  Marland Kitchen amitriptyline (ELAVIL) 25 MG tablet Take 1 tablet (25 mg total) by mouth at bedtime.  Marland Kitchen aspirin 81 MG tablet Take 81 mg by mouth daily.  . Cholecalciferol (VITAMIN D) 1000 UNITS capsule Take 2,000 Units by mouth daily.   . ferrous sulfate 325 (65 FE) MG tablet take 1 tablet by mouth twice a day with meals  . glipiZIDE (GLUCOTROL XL) 2.5 MG 24 hr tablet TAKE 1 TABLET BY MOUTH  EVERY EVENING  . hydrochlorothiazide (HYDRODIURIL) 25 MG tablet TAKE 1 TABLET BY MOUTH  DAILY  . losartan (COZAAR) 100 MG tablet TAKE 1 TABLET BY MOUTH  DAILY  . meclizine (ANTIVERT) 25 MG tablet Take 1 tablet (25 mg total) by mouth 3 (three) times daily as needed.  . meloxicam (MOBIC) 15 MG tablet TAKE 1 TABLET(15  MG) BY MOUTH DAILY WITH A MEAL FOR KNEE PAIN  . metFORMIN (GLUCOPHAGE) 1000 MG tablet TAKE 1 TABLET BY MOUTH TWO  TIMES DAILY WITH A MEAL  . Omega-3 Fatty Acids (FISH OIL) 1200 MG CAPS Take 1 capsule by mouth daily.  Marland Kitchen omeprazole (PRILOSEC) 20 MG capsule TAKE 1 CAPSULE BY MOUTH  DAILY  . ONETOUCH DELICA LANCETS 61Y MISC Check blood sugar once  daily and as directed. Dx  E11.9  . ONETOUCH VERIO test strip TEST BLOOD SUGAR ONCE DAILY FOR DIABETES MELLITUS AS AS NEEDED.  Marland Kitchen SALINE NASAL SPRAY NA Place 1 spray into the nose daily.   . [DISCONTINUED] benzonatate (TESSALON) 200 MG capsule Take 1 capsule (200 mg total) by mouth 3 (three) times daily as needed for cough. Do not bite pill  . [DISCONTINUED] doxycycline (VIBRA-TABS) 100 MG tablet Take 1 tablet (100 mg total) by mouth 2 (two) times daily.  . [DISCONTINUED] promethazine-dextromethorphan (PROMETHAZINE-DM) 6.25-15 MG/5ML syrup Take 5 mLs by mouth 4 (four) times daily as needed for cough. Caution of sedation   No facility-administered encounter medications on file as of 12/21/2017.     Allergies (verified) Cholestyramine; Lipitor [atorvastatin]; Niacin; and Simvastatin   History: Past Medical History:  Diagnosis Date  . Allergic rhinitis   . Breast cancer ([place]) 1996   right breast  . DM2 (diabetes mellitus,  type 2) ([place])   . GERD (gastroesophageal reflux disease)   . Hx of cardiovascular stress test    a. Lex MV 2/14:  EF 69%, no ischemia  . Hyperlipidemia   . Hypertension   . Iron deficiency anemia   . Obesity   . Osteoarthritis    hands  . Ulcer    Past Surgical History:  Procedure Laterality Date  . APPENDECTOMY  1976  . BREAST LUMPECTOMY Right 1996   right breast, lumpectomy w/nodes  . GASTRIC RESTRICTION SURGERY     for reflux  . HYSTEROSCOPY W/D&C N/A 10/18/2012   Procedure: DILATATION AND CURETTAGE /HYSTEROSCOPY;  Surgeon: Mora Bellman, MD;  Location: [facility];  Service: Gynecology;  Laterality: N/A;  . POLYPECTOMY N/A  10/18/2012   Procedure: POLYPECTOMY;  Surgeon: Mora Bellman, MD;  Location: [facility];  Service: Gynecology;  Laterality: N/A;   Family History  Problem Relation Age of Onset  . Diabetes Mother   . Heart disease Mother        Pacemaker, CHF  . Hypertension Mother   . Kidney cancer Mother   . Stroke Father   . CAD Father 63       Died with MI  . Esophageal cancer Brother   . Heart disease Sister   . Diabetes Sister   . Colon cancer Neg Hx   . Rectal cancer Neg Hx   . Stomach cancer Neg Hx    Social History   Socioeconomic History  . Marital status: Single    Spouse name: Not on file  . Number of children: 0  . Years of education: Not on file  . Highest education level: Not on file  Occupational History  . Occupation: retired    Fish farm manager: RETIRED  Social Needs  . Financial resource strain: Not on file  . Food insecurity:    Worry: Not on file    Inability: Not on file  . Transportation needs:    Medical: Not on file    Non-medical: Not on file  Tobacco Use  . Smoking status: Never Smoker  . Smokeless tobacco: Never Used  Substance and Sexual Activity  . Alcohol use: Not on file  . Drug use: No  . Sexual activity: Not Currently    Birth control/protection: Post-menopausal  Lifestyle  . Physical activity:    Days per week: Not on file    Minutes per session: Not on file  . Stress: Not on file  Relationships  . Social connections:    Talks on phone: Not on file    Gets together: Not on file    Attends religious service: Not on file    Active member of club or organization: Not on file    Attends meetings of clubs or organizations: Not on file    Relationship status: Not on file  Other Topics Concern  . Not on file  Social History Narrative   Lives with, and cares for her elderly mother.   Tobacco Counseling Counseling given: No   Clinical Intake:  Pre-visit preparation completed: Yes  Pain : No/denies pain Pain Score: 7      Nutritional Status:  BMI 25 -29 Overweight Nutritional Risks: None Diabetes: No  How often do you need to have someone help you when you read instructions, pamphlets, or other written materials from your doctor or pharmacy?: 1 - Never What is the last grade level you completed in school?: 12th grade  Interpreter Needed?: No  Comments: pt is single and  lives alone Information entered by :: LPinson, LPN  Activities of Daily Living In your present state of health, do you have any difficulty performing the following activities: 12/21/2017  Hearing? N  Vision? N  Difficulty concentrating or making decisions? N  Walking or climbing stairs? Y  Dressing or bathing? N  Doing errands, shopping? N  Preparing Food and eating ? N  Using the Toilet? N  In the past six months, have you accidently leaked urine? N  Do you have problems with loss of bowel control? N  Managing your Medications? N  Managing your Finances? N  Housekeeping or managing your Housekeeping? N  Some recent data might be hidden     Immunizations and Health Maintenance Immunization History  Administered Date(s) Administered  . Influenza Split 04/09/2011, 04/01/2012  . Influenza Whole 04/23/2007, 04/10/2008, 04/10/2009, 04/02/2010  . Influenza,inj,Quad PF,6+ Mos 03/23/2013, 03/29/2014, 03/13/2015, 04/10/2016, 03/25/2017  . Pneumococcal Conjugate-13 02/08/2016  . Pneumococcal Polysaccharide-23 04/13/2008, 05/16/2013, 02/26/2017  . Td 02/09/2009  . Zoster 01/28/2011   There are no preventive care reminders to display for this patient.  Patient Care Team: Tower, Wynelle Fanny, MD as PCP - General  Indicate any recent Medical Services you may have received from other than [facility] providers in the past year (date may be approximate).    Assessment:   This is a routine wellness examination for [facility].  Hearing/Vision screen  Hearing Screening   125Hz  250Hz  500Hz  1000Hz  2000Hz  3000Hz  4000Hz  6000Hz  8000Hz   Right ear:   40 40 40  40    Left ear:    40 40 40  40      Visual Acuity Screening   Right eye Left eye Both eyes  Without correction:     With correction: 20/25-1 2-/50 20/25    Dietary issues and exercise activities discussed: Current Exercise Habits: The patient does not participate in regular exercise at present, Exercise limited by: orthopedic condition(s)  Goals    . DIET - INCREASE WATER INTAKE     Starting 12/21/2017, I will continue to drink at least 6-8 glasses of water daily.       Depression Screen PHQ 2/9 Scores 12/21/2017 03/01/2014 02/10/2013  PHQ - 2 Score 0 0 0  PHQ- 9 Score 0 - -    Fall Risk Fall Risk  12/21/2017 12/17/2016  Falls in the past year? No No    Cognitive Function: MMSE - Mini Mental State Exam 12/21/2017  Orientation to time 5  Orientation to Place 5  Registration 3  Attention/ Calculation 0  Recall 3  Language- name 2 objects 0  Language- repeat 1  Language- follow 3 step command 3  Language- read & follow direction 0  Write a sentence 0  Copy design 0  Total score 20     PLEASE NOTE: A Mini-Cog screen was completed. Maximum score is 20. A value of 0 denotes this part of [place] MMSE was not completed or the patient failed this part of the Mini-Cog screening.   Mini-Cog Screening Orientation to Time - Max 5 pts Orientation to Place - Max 5 pts Registration - Max 3 pts Recall - Max 3 pts Language Repeat - Max 1 pts Language Follow 3 Step Command - Max 3 pts     Screening Tests Health Maintenance  Topic Date Due  . FOOT EXAM  12/25/2017 (Originally 09/08/2017)  . OPHTHALMOLOGY EXAM  07/13/2018 (Originally 11/26/2017)  . COLON CANCER SCREENING ANNUAL FOBT  08/15/2020 (Originally 05/06/2013)  . Hepatitis  C Screening  08/15/2020 (Originally 1951-02-26)  . INFLUENZA VACCINE  02/11/2018  . MAMMOGRAM  04/29/2018  . HEMOGLOBIN A1C  06/22/2018  . TETANUS/TDAP  02/10/2019  . COLONOSCOPY  05/07/2023  . DEXA SCAN  Completed  . PNA vac Low Risk Adult  Completed       Plan:       I have personally reviewed, addressed, and noted the following in the patient's chart:  A. Medical and social history B. Use of alcohol, tobacco or illicit drugs  C. Current medications and supplements D. Functional ability and status E.  Nutritional status F.  Physical activity G. Advance directives H. List of other physicians I.  Hospitalizations, surgeries, and ER visits in previous 12 months J.  [place] to include hearing, vision, cognitive, depression L. Referrals and appointments - none  In addition, I have reviewed and discussed with patient certain preventive protocols, quality metrics, and best practice recommendations. A written personalized care plan for preventive services as well as general preventive health recommendations were provided to patient.  See attached scanned questionnaire for additional information.   Signed,   Lindell Noe, MHA, BS, LPN Health Coach

## 2017-12-21 NOTE — Progress Notes (Signed)
PCP notes:   Health maintenance:  Foot exam - PCP please address at next appt Eye exam - addressed A1C - completed  Abnormal screenings:   None  Patient concerns:   Patient is pending surgery on left knee.  Nurse concerns:  None  Next PCP appt:   12/25/17 @ 0930  I reviewed health advisor's note, was available for consultation, and agree with documentation and plan. Loura Pardon MD

## 2017-12-23 ENCOUNTER — Ambulatory Visit: Payer: Self-pay | Admitting: Orthopedic Surgery

## 2017-12-25 ENCOUNTER — Ambulatory Visit (INDEPENDENT_AMBULATORY_CARE_PROVIDER_SITE_OTHER): Payer: Medicare Other | Admitting: Family Medicine

## 2017-12-25 ENCOUNTER — Encounter: Payer: Self-pay | Admitting: Family Medicine

## 2017-12-25 VITALS — BP 156/90 | HR 103 | Temp 98.0°F | Ht 59.5 in | Wt 159.2 lb

## 2017-12-25 DIAGNOSIS — I10 Essential (primary) hypertension: Secondary | ICD-10-CM

## 2017-12-25 DIAGNOSIS — E7800 Pure hypercholesterolemia, unspecified: Secondary | ICD-10-CM | POA: Diagnosis not present

## 2017-12-25 DIAGNOSIS — D649 Anemia, unspecified: Secondary | ICD-10-CM

## 2017-12-25 DIAGNOSIS — D473 Essential (hemorrhagic) thrombocythemia: Secondary | ICD-10-CM | POA: Insufficient documentation

## 2017-12-25 DIAGNOSIS — D75839 Thrombocytosis, unspecified: Secondary | ICD-10-CM | POA: Insufficient documentation

## 2017-12-25 DIAGNOSIS — R7989 Other specified abnormal findings of blood chemistry: Secondary | ICD-10-CM

## 2017-12-25 DIAGNOSIS — Z0000 Encounter for general adult medical examination without abnormal findings: Secondary | ICD-10-CM | POA: Diagnosis not present

## 2017-12-25 DIAGNOSIS — E119 Type 2 diabetes mellitus without complications: Secondary | ICD-10-CM | POA: Diagnosis not present

## 2017-12-25 MED ORDER — HYDROCHLOROTHIAZIDE 25 MG PO TABS: 25.0000 mg | ORAL_TABLET | Freq: Every day | ORAL | 3 refills | Status: DC

## 2017-12-25 MED ORDER — AMITRIPTYLINE HCL 25 MG PO TABS: 25.0000 mg | ORAL_TABLET | Freq: Every day | ORAL | 3 refills | Status: DC

## 2017-12-25 MED ORDER — FERROUS SULFATE 325 (65 FE) MG PO TABS: 325.0000 mg | ORAL_TABLET | Freq: Two times a day (BID) | ORAL | 3 refills | Status: DC

## 2017-12-25 MED ORDER — FENOFIBRATE 54 MG PO TABS: 54.0000 mg | ORAL_TABLET | Freq: Every day | ORAL | 3 refills | Status: DC

## 2017-12-25 MED ORDER — GLIPIZIDE ER 2.5 MG PO TB24: 2.5000 mg | ORAL_TABLET | Freq: Every evening | ORAL | 3 refills | Status: DC

## 2017-12-25 MED ORDER — AMLODIPINE BESYLATE 5 MG PO TABS: 5.0000 mg | ORAL_TABLET | Freq: Every day | ORAL | 3 refills | Status: DC

## 2017-12-25 MED ORDER — LOSARTAN POTASSIUM 100 MG PO TABS: 100.0000 mg | ORAL_TABLET | Freq: Every day | ORAL | 3 refills | Status: DC

## 2017-12-25 MED ORDER — METFORMIN HCL 1000 MG PO TABS: ORAL_TABLET | ORAL | 3 refills | Status: DC

## 2017-12-25 MED ORDER — OMEPRAZOLE 20 MG PO CPDR: 20.0000 mg | DELAYED_RELEASE_CAPSULE | Freq: Every day | ORAL | 3 refills | Status: DC

## 2017-12-25 NOTE — Progress Notes (Signed)
Subjective:    Patient ID: Martha Hamilton, female    DOB: 08-21-1950, 67 y.o.   MRN: [id number]  HPI  Here for health maintenance exam and to review chronic medical problems    Also form for surgical clearance for L knee arthroscopy with Dr Harlow Mares  Surgery is 7/31  Meniscal tear and popliteal cyst    Wt Readings from Last 3 Encounters:  12/25/17 159 lb 4 oz (72.2 kg)  12/21/17 157 lb 12 oz (71.6 kg)  10/29/17 159 lb 12 oz (72.5 kg)  cannot get around as much  Eating healthy  31.63 kg/m   amw 6/10 -no concerns  Eye exam 5/18- does not have it set up yet   Still grief for loss of mom  Lots of family support   Colon cancer screening  Colonoscopy neg 2014 with 10 y recall   Mammogram 10/18 -nl  Self breast exam -no changes  Personal hx of breast cancer -lumpectomy   Gyn care - does not see one/ no problems    bp is up today  No cp or palpitations or headaches or edema  No side effects to medicines  BP Readings from Last 3 Encounters:  12/25/17 (!) 156/90  12/21/17 (!) 146/90  10/29/17 128/72      dexa 3/18 BMD is in the normal range  No falls or fx On ca and D  zostavax 7/12  DM2 Lab Results  Component Value Date   HGBA1C 6.4 12/21/2017  doing well  Fairly stable  Eye exam -will schedule   Hyperlipidemia Lab Results  Component Value Date   CHOL 225 (H) 12/21/2017   CHOL 173 12/17/2016   CHOL 172 06/11/2016   Lab Results  Component Value Date   HDL 47.40 12/21/2017   HDL 43.60 12/17/2016   HDL 42.50 06/11/2016   Lab Results  Component Value Date   LDLCALC 51 10/21/2011   LDLCALC 48 04/29/2010   Lab Results  Component Value Date   TRIG (H) 12/21/2017    543.0 Triglyceride is over 400; calculations on Lipids are invalid.   TRIG (H) 12/17/2016    423.0 Triglyceride is over 400; calculations on Lipids are invalid.   TRIG 377.0 (H) 06/11/2016   Lab Results  Component Value Date   CHOLHDL 5 12/21/2017   CHOLHDL 4 12/17/2016   CHOLHDL 4  06/11/2016   Lab Results  Component Value Date   LDLDIRECT 102.0 12/21/2017   LDLDIRECT 89.0 12/17/2016   LDLDIRECT 82.0 06/11/2016  does not tolerate statins  Would be open to a fibrate     H/o anemia Lab Results  Component Value Date   WBC 9.1 12/21/2017   HGB 11.2 (L) 12/21/2017   HCT 33.8 (L) 12/21/2017   MCV 81.1 12/21/2017   PLT 547.0 (H) 12/21/2017   Takes iron -no missed doses  No fatigue /does not notice it  Platelets are high - unsure why  No recent illness   Lab Results  Component Value Date   CREATININE 0.76 12/21/2017   BUN 25 (H) 12/21/2017   NA 137 12/21/2017   K 4.4 12/21/2017   CL 98 12/21/2017   CO2 28 12/21/2017   Lab Results  Component Value Date   ALT 16 12/21/2017   AST 15 12/21/2017   ALKPHOS 49 12/21/2017   BILITOT 0.3 12/21/2017   still drinks a lot of water   Lab Results  Component Value Date   TSH 1.05 12/21/2017     Patient  Active Problem List   Diagnosis Date Noted  . High platelet count 12/25/2017  . Left knee pain 09/21/2017  . Low back pain 07/06/2017  . Welcome to Medicare preventive visit 12/17/2016  . Grief reaction 12/17/2016  . Estrogen deficiency 09/08/2016  . Pre-syncope 06/11/2016  . Muscle pain 02/08/2016  . Anemia 02/09/2013  . Abnormal EKG 07/27/2012  . Special screening for malignant neoplasms, colon 04/29/2011  . Gynecological examination 04/29/2011  . Routine general medical examination at a health care facility 04/20/2011  . OSTEOARTHRITIS, HANDS, BILATERAL 10/31/2008  . Diabetes type 2, controlled ([place]) 07/10/2008  . Hyperlipidemia 01/26/2008  . Essential hypertension 01/26/2008  . ALLERGIC RHINITIS 12/07/2007   Past Medical History:  Diagnosis Date  . Allergic rhinitis   . Breast cancer ([place]) 1996   right breast  . DM2 (diabetes mellitus, type 2) ([place])   . GERD (gastroesophageal reflux disease)   . Hx of cardiovascular stress test    a. Lex MV 2/14:  EF 69%, no ischemia  . Hyperlipidemia     . Hypertension   . Iron deficiency anemia   . Obesity   . Osteoarthritis    hands  . Ulcer    Past Surgical History:  Procedure Laterality Date  . APPENDECTOMY  1976  . BREAST LUMPECTOMY Right 1996   right breast, lumpectomy w/nodes  . GASTRIC RESTRICTION SURGERY     for reflux  . HYSTEROSCOPY W/D&C N/A 10/18/2012   Procedure: DILATATION AND CURETTAGE /HYSTEROSCOPY;  Surgeon: Mora Bellman, MD;  Location: [facility];  Service: Gynecology;  Laterality: N/A;  . POLYPECTOMY N/A 10/18/2012   Procedure: POLYPECTOMY;  Surgeon: Mora Bellman, MD;  Location: [facility];  Service: Gynecology;  Laterality: N/A;   Social History   Tobacco Use  . Smoking status: Never Smoker  . Smokeless tobacco: Never Used  Substance Use Topics  . Alcohol use: Not on file  . Drug use: No   Family History  Problem Relation Age of Onset  . Diabetes Mother   . Heart disease Mother        Pacemaker, CHF  . Hypertension Mother   . Kidney cancer Mother   . Stroke Father   . CAD Father 41       Died with MI  . Esophageal cancer Brother   . Heart disease Sister   . Diabetes Sister   . Colon cancer Neg Hx   . Rectal cancer Neg Hx   . Stomach cancer Neg Hx    Allergies  Allergen Reactions  . Cholestyramine Other (See Comments)    REACTION: Muscles tightened up, drew up.  . Lipitor [Atorvastatin] Other (See Comments)    Muscle cramping  . Niacin Nausea And Vomiting  . Simvastatin     Muscular pain   Current Outpatient Medications on File Prior to Visit  Medication Sig Dispense Refill  . acetaminophen (TYLENOL) 500 MG tablet Take 500 mg by mouth every 6 (six) hours as needed for mild pain.     Marland Kitchen albuterol (PROVENTIL HFA;VENTOLIN HFA) 108 (90 Base) MCG/ACT inhaler Inhale 2 puffs into the lungs every 4 (four) hours as needed for wheezing. 1 Inhaler 0  . aspirin 81 MG tablet Take 81 mg by mouth daily.    . Cholecalciferol (VITAMIN D) 1000 UNITS capsule Take 2,000 Units by mouth daily.     . meclizine  (ANTIVERT) 25 MG tablet Take 1 tablet (25 mg total) by mouth 3 (three) times daily as needed. 90 tablet 0  .  meloxicam (MOBIC) 15 MG tablet TAKE 1 TABLET(15 MG) BY MOUTH DAILY WITH A MEAL FOR KNEE PAIN 90 tablet 0  . Omega-3 Fatty Acids (FISH OIL) 1200 MG CAPS Take 1 capsule by mouth daily.    Glory Rosebush DELICA LANCETS 38H MISC Check blood sugar once  daily and as directed. Dx  E11.9 100 each 3  . ONETOUCH VERIO test strip TEST BLOOD SUGAR ONCE DAILY FOR DIABETES MELLITUS AS AS NEEDED. 100 each 3  . SALINE NASAL SPRAY NA Place 1 spray into the nose daily.      No current facility-administered medications on file prior to visit.     Review of Systems  Constitutional: Negative for activity change, appetite change, fatigue, fever and unexpected weight change.  HENT: Negative for congestion, ear pain, rhinorrhea, sinus pressure and sore throat.   Eyes: Negative for pain, redness and visual disturbance.  Respiratory: Negative for cough, shortness of breath and wheezing.   Cardiovascular: Negative for chest pain and palpitations.  Gastrointestinal: Negative for abdominal pain, blood in stool, constipation and diarrhea.  Endocrine: Negative for polydipsia and polyuria.  Genitourinary: Negative for dysuria, frequency and urgency.  Musculoskeletal: Positive for arthralgias. Negative for back pain and myalgias.       Knee pain   Skin: Negative for pallor and rash.  Allergic/Immunologic: Negative for environmental allergies.  Neurological: Negative for dizziness, syncope and headaches.  Hematological: Negative for adenopathy. Does not bruise/bleed easily.  Psychiatric/Behavioral: Negative for decreased concentration and dysphoric mood. The patient is not nervous/anxious.        Objective:   Physical Exam  Constitutional: She appears well-developed and well-nourished. No distress.  HENT:  Head: Normocephalic and atraumatic.  Right Ear: External ear normal.  Left Ear: External ear normal.    Mouth/Throat: Oropharynx is clear and moist.  Eyes: Pupils are equal, round, and reactive to light. Conjunctivae and EOM are normal. No scleral icterus.  Neck: Normal range of motion. Neck supple. No JVD present. Carotid bruit is not present. No thyromegaly present.  Cardiovascular: Normal rate, regular rhythm, normal heart sounds and intact distal pulses. Exam reveals no gallop.  Pulmonary/Chest: Effort normal and breath sounds normal. No respiratory distress. She has no wheezes. She exhibits no tenderness. No breast tenderness, discharge or bleeding.  Abdominal: Soft. Bowel sounds are normal. She exhibits no distension, no abdominal bruit and no mass. There is no tenderness.  Genitourinary: No breast tenderness, discharge or bleeding.  Genitourinary Comments: Breast exam: No mass, nodules, thickening, tenderness, bulging, retraction, inflamation, nipple discharge or skin changes noted.  No axillary or clavicular LA.    Surgical changes baseline/unchanged   Musculoskeletal: Normal range of motion. She exhibits no edema or tenderness.  Lymphadenopathy:    She has no cervical adenopathy.  Neurological: She is alert. She has normal reflexes. No cranial nerve deficit. She exhibits normal muscle tone. Coordination normal.  Skin: Skin is warm and dry. No rash noted. No erythema. No pallor.  Fair Some lentigines   Psychiatric: She has a normal mood and affect.  pleasant          Assessment & Plan:   Problem List Items Addressed This Visit      Cardiovascular and Mediastinum   Essential hypertension    bp needs better control BP Readings from Last 1 Encounters:  12/25/17 (!) 156/90   Adding amlodipine 5 mg daily  Most recent labs reviewed  Disc lifstyle change with low sodium diet and exercise  F/u 2-4 weeks  Relevant Medications   fenofibrate 54 MG tablet   amLODipine (NORVASC) 5 MG tablet   hydrochlorothiazide (HYDRODIURIL) 25 MG tablet   losartan (COZAAR) 100 MG  tablet     Endocrine   Diabetes type 2, controlled ([place])    Lab Results  Component Value Date   HGBA1C 6.4 12/21/2017   She will make her own eye exam  Rev foot care        Relevant Medications   glipiZIDE (GLUCOTROL XL) 2.5 MG 24 hr tablet   losartan (COZAAR) 100 MG tablet   metFORMIN (GLUCOPHAGE) 1000 MG tablet     Other   Anemia    Takes iron -no missed doses  11.2 HB Getting ready for her knee surgery-will inc to bid plt high -this will need re check  F/u 2-4 wk      Relevant Medications   ferrous sulfate 325 (65 FE) MG tablet   High platelet count    547 Iron def also  Re check 2-4 wk Has knee surgery upcoming       Hyperlipidemia    Trig are up  Into of statin  Will try fenofibrate low dose  Disc goals for lipids and reasons to control them Rev last labs with pt Rev low sat fat diet in detail Re check at f/u      Relevant Medications   fenofibrate 54 MG tablet   amLODipine (NORVASC) 5 MG tablet   hydrochlorothiazide (HYDRODIURIL) 25 MG tablet   losartan (COZAAR) 100 MG tablet   Routine general medical examination at a health care facility - Primary    Reviewed health habits including diet and exercise and skin cancer prevention Reviewed appropriate screening tests for age  Also reviewed health mt list, fam hx and immunization status , as well as social and family history   See HPI Labs rev  tx HTN and elevated trig and f/u She will schedule her own eye exam  Increase iron dosing in prep for knee surgery

## 2017-12-25 NOTE — Patient Instructions (Addendum)
Please get your eye exam done -don't forget   Let's try fenofibrate for triglycerides  Any side effects-stop and alert [place]   Start amlodipine for blood once daily  Any side effects stop and alert [place]   Make sure you are getting at least 64 oz of fluids per day -mostly water   Got back to 2 iron pills daily with food   Follow up the 2nd week in July

## 2017-12-26 NOTE — Assessment & Plan Note (Signed)
Takes iron -no missed doses  11.2 HB Getting ready for her knee surgery-will inc to bid plt high -this will need re check  F/u 2-4 wk

## 2017-12-26 NOTE — Assessment & Plan Note (Signed)
Trig are up  Into of statin  Will try fenofibrate low dose  Disc goals for lipids and reasons to control them Rev last labs with pt Rev low sat fat diet in detail Re check at f/u

## 2017-12-26 NOTE — Assessment & Plan Note (Signed)
bp needs better control BP Readings from Last 1 Encounters:  12/25/17 (!) 156/90   Adding amlodipine 5 mg daily  Most recent labs reviewed  Disc lifstyle change with low sodium diet and exercise  F/u 2-4 weeks

## 2017-12-26 NOTE — Assessment & Plan Note (Signed)
547 Iron def also  Re check 2-4 wk Has knee surgery upcoming

## 2017-12-26 NOTE — Assessment & Plan Note (Signed)
Reviewed health habits including diet and exercise and skin cancer prevention Reviewed appropriate screening tests for age  Also reviewed health mt list, fam hx and immunization status , as well as social and family history   See HPI Labs rev  tx HTN and elevated trig and f/u She will schedule her own eye exam  Increase iron dosing in prep for knee surgery

## 2017-12-26 NOTE — Assessment & Plan Note (Signed)
Lab Results  Component Value Date   HGBA1C 6.4 12/21/2017   She will make her own eye exam  Rev foot care

## 2017-12-29 ENCOUNTER — Telehealth: Payer: Self-pay

## 2017-12-29 NOTE — Telephone Encounter (Signed)
Called and spoke with pt informing her of recommendations. Understanding verbalized nothing further needed at this time. Will follow up as planned.

## 2017-12-29 NOTE — Telephone Encounter (Signed)
Pt notified as instructed and pt voiced understanding.pt said she usually cks her BP 4 x a day.if BP is elevated or any other problems pt will cb otherwise pt will keep appt on 01/22/18.

## 2017-12-29 NOTE — Telephone Encounter (Signed)
Try staying off of it  If bp goes up to 140/90 or above then start 1/2 pill daily of the amlodipine Keep me posted  Will see her for f/u as planned

## 2017-12-29 NOTE — Telephone Encounter (Signed)
I spoke with pt; pt started taking amlodipine on 12/25/17 at night; on 12/26/17 pt felt fine and took amlodipine on 12/26/17 at night but after church on 12/27/17 pt did not feel right and took BP 99/55 P 105; pt did not take amlodipine on 12/27/17 and has not taken amlodipine since. Pt has been taking the losartan and the HCTZ each morning. 12/29/17 at 10:05 AM BP 136/74 P 103. Pt feels OK, no H/a,dizziness,CP or SOB.pt wants to know if Dr Glori Bickers wants pt to stay off the amlodipine or take every other night or what to do. [employer] [employer]. Please advise. Pt request cb.

## 2017-12-29 NOTE — Telephone Encounter (Signed)
Copied from [place] [id number]. Topic: General - Other >> Dec 29, 2017 10:54 AM Keene Breath wrote: Reason for CRM: Patient called to speak with the nurse regarding a new medication that she started taking.  The doctor advised her to call to discuss the medication and let her know how it was working.  CB# [phone].

## 2018-01-06 ENCOUNTER — Telehealth: Payer: Self-pay | Admitting: Family Medicine

## 2018-01-06 DIAGNOSIS — D649 Anemia, unspecified: Secondary | ICD-10-CM

## 2018-01-06 DIAGNOSIS — E7800 Pure hypercholesterolemia, unspecified: Secondary | ICD-10-CM

## 2018-01-06 NOTE — Telephone Encounter (Signed)
-----   Message from Ellamae Sia sent at 01/06/2018 10:37 AM EDT ----- Regarding: Lab orders for Tuesday, 7.9.19 Lab orders for a 1 month f/u

## 2018-01-19 ENCOUNTER — Other Ambulatory Visit (INDEPENDENT_AMBULATORY_CARE_PROVIDER_SITE_OTHER): Payer: Medicare Other

## 2018-01-19 DIAGNOSIS — E7800 Pure hypercholesterolemia, unspecified: Secondary | ICD-10-CM | POA: Diagnosis not present

## 2018-01-19 DIAGNOSIS — D649 Anemia, unspecified: Secondary | ICD-10-CM

## 2018-01-19 LAB — CBC WITH DIFFERENTIAL/PLATELET
BASOS ABS: 0.1 10*3/uL (ref 0.0–0.1)
BASOS PCT: 1.3 % (ref 0.0–3.0)
EOS PCT: 2.9 % (ref 0.0–5.0)
Eosinophils Absolute: 0.2 10*3/uL (ref 0.0–0.7)
HEMATOCRIT: 34.5 % — AB (ref 36.0–46.0)
Hemoglobin: 11.3 g/dL — ABNORMAL LOW (ref 12.0–15.0)
LYMPHS PCT: 32.8 % (ref 12.0–46.0)
Lymphs Abs: 2.1 10*3/uL (ref 0.7–4.0)
MCHC: 32.8 g/dL (ref 30.0–36.0)
MCV: 84.6 fl (ref 78.0–100.0)
MONOS PCT: 7.9 % (ref 3.0–12.0)
Monocytes Absolute: 0.5 10*3/uL (ref 0.1–1.0)
NEUTROS ABS: 3.5 10*3/uL (ref 1.4–7.7)
Neutrophils Relative %: 55.1 % (ref 43.0–77.0)
PLATELETS: 460 10*3/uL — AB (ref 150.0–400.0)
RBC: 4.08 Mil/uL (ref 3.87–5.11)
RDW: 17.5 % — AB (ref 11.5–15.5)
WBC: 6.3 10*3/uL (ref 4.0–10.5)

## 2018-01-19 LAB — LIPID PANEL
CHOLESTEROL: 187 mg/dL (ref 0–200)
HDL: 46.8 mg/dL (ref >39.00)
NonHDL: 140.33
TRIGLYCERIDES: 301 mg/dL — AB (ref 0.0–149.0)
Total CHOL/HDL Ratio: 4
VLDL: 60.2 mg/dL — ABNORMAL HIGH (ref 0.0–40.0)

## 2018-01-19 LAB — FERRITIN: Ferritin: 8.4 ng/mL — ABNORMAL LOW (ref 10.0–291.0)

## 2018-01-19 LAB — ALT: ALT: 18 U/L (ref 0–35)

## 2018-01-19 LAB — LDL CHOLESTEROL, DIRECT: Direct LDL: 107 mg/dL

## 2018-01-19 LAB — AST: AST: 17 U/L (ref 0–37)

## 2018-01-22 ENCOUNTER — Ambulatory Visit: Payer: Medicare Other | Admitting: Family Medicine

## 2018-01-22 ENCOUNTER — Encounter: Payer: Self-pay | Admitting: Family Medicine

## 2018-01-22 VITALS — BP 146/72 | HR 111 | Temp 98.0°F | Ht 59.5 in | Wt 158.0 lb

## 2018-01-22 DIAGNOSIS — R7989 Other specified abnormal findings of blood chemistry: Secondary | ICD-10-CM

## 2018-01-22 DIAGNOSIS — Z01818 Encounter for other preprocedural examination: Secondary | ICD-10-CM | POA: Diagnosis not present

## 2018-01-22 DIAGNOSIS — D649 Anemia, unspecified: Secondary | ICD-10-CM | POA: Diagnosis not present

## 2018-01-22 DIAGNOSIS — I10 Essential (primary) hypertension: Secondary | ICD-10-CM

## 2018-01-22 DIAGNOSIS — E782 Mixed hyperlipidemia: Secondary | ICD-10-CM | POA: Diagnosis not present

## 2018-01-22 NOTE — Assessment & Plan Note (Signed)
bp improved with amlodipine Lower at home /whitecoat  bp in fair control at this time  BP Readings from Last 1 Encounters:  01/22/18 (!) 146/72   No changes needed Most recent labs reviewed  Disc lifstyle change with low sodium diet and exercise   Continue amlodipine- take 1/2 dose for low bp

## 2018-01-22 NOTE — Patient Instructions (Addendum)
Blood pressure is improved  Anemia is stable- we may need to investigate iron infusions in the future I will make your orthopedic doctor aware   Cholesterol is improved   EKG is stable   I do not anticipate problems with upcoming surgery  They will tell you how long to hold aspirin before hand

## 2018-01-22 NOTE — Progress Notes (Signed)
Subjective:    Patient ID: Martha Hamilton, female    DOB: [birth year], 67 y.o.   MRN: [id number]  HPI Here for f/u of chronic health problems  Also pre op exam for her knee surgery 7/30   No blood thinners  No nsaids but 81 mg asa daily  Some med intolerances  No anaphylaxis at all to anything   No problems with anesthesia in the past    Wt Readings from Last 3 Encounters:  01/22/18 158 lb (71.7 kg)  12/25/17 159 lb 4 oz (72.2 kg)  12/21/17 157 lb 12 oz (71.6 kg)   31.38 kg/m   Last visit added amlodipine for bp control bp got low a few times 99/55 Most home readings are in 120s to 130s Improved  BP Readings from Last 3 Encounters:  01/22/18 (!) 146/72  12/25/17 (!) 156/90  12/21/17 (!) 146/90    Lab Results  Component Value Date   CREATININE 0.76 12/21/2017   BUN 25 (H) 12/21/2017   NA 137 12/21/2017   K 4.4 12/21/2017   CL 98 12/21/2017   CO2 28 12/21/2017    Lab Results  Component Value Date   WBC 6.3 01/19/2018   HGB 11.3 (L) 01/19/2018   HCT 34.5 (L) 01/19/2018   MCV 84.6 01/19/2018   PLT 460.0 (H) 01/19/2018   Inc her iron to bid in prep for knee surg (hb went from 11.2 to 11.3) Lab Results  Component Value Date   FERRITIN 8.4 (L) 01/19/2018  platelet ct down from 547 as well   Unsure if absorbing well  ? May need iron infusion in the future   Needs re check today   EKG is stable  Short PR interval/no change  Rate of 97 (pt states she is quite nervous today-not unusual for her)   Lipids Lab Results  Component Value Date   CHOL 187 01/19/2018   CHOL 225 (H) 12/21/2017   CHOL 173 12/17/2016   Lab Results  Component Value Date   HDL 46.80 01/19/2018   HDL 47.40 12/21/2017   HDL 43.60 12/17/2016   Lab Results  Component Value Date   LDLCALC 51 10/21/2011   LDLCALC 48 04/29/2010   Lab Results  Component Value Date   TRIG 301.0 (H) 01/19/2018   TRIG (H) 12/21/2017    543.0 Triglyceride is over 400; calculations on Lipids are  invalid.   TRIG (H) 12/17/2016    423.0 Triglyceride is over 400; calculations on Lipids are invalid.   Lab Results  Component Value Date   CHOLHDL 4 01/19/2018   CHOLHDL 5 12/21/2017   CHOLHDL 4 12/17/2016   Lab Results  Component Value Date   LDLDIRECT 107.0 01/19/2018   LDLDIRECT 102.0 12/21/2017   LDLDIRECT 89.0 12/17/2016   Fenofibrate low dose - tolerates well  Watching diet  Trig down significantly    Patient Active Problem List   Diagnosis Date Noted  . Pre-op examination 01/22/2018  . High platelet count 12/25/2017  . Left knee pain 09/21/2017  . Low back pain 07/06/2017  . Welcome to Medicare preventive visit 12/17/2016  . Grief reaction 12/17/2016  . Estrogen deficiency 09/08/2016  . Pre-syncope 06/11/2016  . Muscle pain 02/08/2016  . Anemia 02/09/2013  . Abnormal EKG 07/27/2012  . Special screening for malignant neoplasms, colon 04/29/2011  . Gynecological examination 04/29/2011  . Routine general medical examination at a health care facility 04/20/2011  . OSTEOARTHRITIS, HANDS, BILATERAL 10/31/2008  . Diabetes type 2, controlled (  [place]) 07/10/2008  . Hyperlipidemia 01/26/2008  . Essential hypertension 01/26/2008  . ALLERGIC RHINITIS 12/07/2007   Past Medical History:  Diagnosis Date  . Allergic rhinitis   . Breast cancer ([place]) 1996   right breast  . DM2 (diabetes mellitus, type 2) ([place])   . GERD (gastroesophageal reflux disease)   . Hx of cardiovascular stress test    a. Lex MV 2/14:  EF 69%, no ischemia  . Hyperlipidemia   . Hypertension   . Iron deficiency anemia   . Obesity   . Osteoarthritis    hands  . Ulcer    Past Surgical History:  Procedure Laterality Date  . APPENDECTOMY  1976  . BREAST LUMPECTOMY Right 1996   right breast, lumpectomy w/nodes  . GASTRIC RESTRICTION SURGERY     for reflux  . HYSTEROSCOPY W/D&C N/A 10/18/2012   Procedure: DILATATION AND CURETTAGE /HYSTEROSCOPY;  Surgeon: Mora Bellman, MD;  Location: [facility];   Service: Gynecology;  Laterality: N/A;  . POLYPECTOMY N/A 10/18/2012   Procedure: POLYPECTOMY;  Surgeon: Mora Bellman, MD;  Location: [facility];  Service: Gynecology;  Laterality: N/A;   Social History   Tobacco Use  . Smoking status: Never Smoker  . Smokeless tobacco: Never Used  Substance Use Topics  . Alcohol use: Not on file  . Drug use: No   Family History  Problem Relation Age of Onset  . Diabetes Mother   . Heart disease Mother        Pacemaker, CHF  . Hypertension Mother   . Kidney cancer Mother   . Stroke Father   . CAD Father 82       Died with MI  . Esophageal cancer Brother   . Heart disease Sister   . Diabetes Sister   . Colon cancer Neg Hx   . Rectal cancer Neg Hx   . Stomach cancer Neg Hx    Allergies  Allergen Reactions  . Cholestyramine Other (See Comments)    REACTION: Muscles tightened up, drew up.  . Lipitor [Atorvastatin] Other (See Comments)    Muscle cramping  . Niacin Nausea And Vomiting  . Simvastatin     Muscular pain   Current Outpatient Medications on File Prior to Visit  Medication Sig Dispense Refill  . acetaminophen (TYLENOL) 500 MG tablet Take 500 mg by mouth every 6 (six) hours as needed for mild pain.     Marland Kitchen albuterol (PROVENTIL HFA;VENTOLIN HFA) 108 (90 Base) MCG/ACT inhaler Inhale 2 puffs into the lungs every 4 (four) hours as needed for wheezing. 1 Inhaler 0  . amitriptyline (ELAVIL) 25 MG tablet Take 1 tablet (25 mg total) by mouth at bedtime. 90 tablet 3  . amLODipine (NORVASC) 5 MG tablet Take 1 tablet (5 mg total) by mouth daily. 30 tablet 3  . aspirin 81 MG tablet Take 81 mg by mouth daily.    . Cholecalciferol (VITAMIN D) 1000 UNITS capsule Take 2,000 Units by mouth daily.     . fenofibrate 54 MG tablet Take 1 tablet (54 mg total) by mouth daily. 30 tablet 3  . ferrous sulfate 325 (65 FE) MG tablet Take 1 tablet (325 mg total) by mouth 2 (two) times daily with a meal. 180 tablet 3  . glipiZIDE (GLUCOTROL XL) 2.5 MG 24 hr tablet  Take 1 tablet (2.5 mg total) by mouth every evening. 90 tablet 3  . hydrochlorothiazide (HYDRODIURIL) 25 MG tablet Take 1 tablet (25 mg total) by mouth daily. 90 tablet 3  .  losartan (COZAAR) 100 MG tablet Take 1 tablet (100 mg total) by mouth daily. 90 tablet 3  . meclizine (ANTIVERT) 25 MG tablet Take 1 tablet (25 mg total) by mouth 3 (three) times daily as needed. 90 tablet 0  . meloxicam (MOBIC) 15 MG tablet TAKE 1 TABLET(15 MG) BY MOUTH DAILY WITH A MEAL FOR KNEE PAIN 90 tablet 0  . metFORMIN (GLUCOPHAGE) 1000 MG tablet TAKE 1 TABLET BY MOUTH TWO  TIMES DAILY WITH A MEAL 180 tablet 3  . Omega-3 Fatty Acids (FISH OIL) 1200 MG CAPS Take 1 capsule by mouth daily.    Marland Kitchen omeprazole (PRILOSEC) 20 MG capsule Take 1 capsule (20 mg total) by mouth daily. 90 capsule 3  . ONETOUCH DELICA LANCETS 25E MISC Check blood sugar once  daily and as directed. Dx  E11.9 100 each 3  . ONETOUCH VERIO test strip TEST BLOOD SUGAR ONCE DAILY FOR DIABETES MELLITUS AS AS NEEDED. 100 each 3  . SALINE NASAL SPRAY NA Place 1 spray into the nose daily.      No current facility-administered medications on file prior to visit.     Review of Systems  Constitutional: Negative for activity change, appetite change, fatigue, fever and unexpected weight change.  HENT: Negative for congestion, ear pain, rhinorrhea, sinus pressure and sore throat.   Eyes: Negative for pain, redness and visual disturbance.  Respiratory: Negative for cough, shortness of breath and wheezing.   Cardiovascular: Negative for chest pain and palpitations.  Gastrointestinal: Negative for abdominal pain, blood in stool, constipation and diarrhea.  Endocrine: Negative for polydipsia and polyuria.  Genitourinary: Negative for dysuria, frequency and urgency.  Musculoskeletal: Positive for arthralgias. Negative for back pain and myalgias.  Skin: Negative for pallor and rash.  Allergic/Immunologic: Negative for environmental allergies.  Neurological: Negative  for dizziness, syncope and headaches.  Hematological: Negative for adenopathy. Does not bruise/bleed easily.  Psychiatric/Behavioral: Negative for decreased concentration and dysphoric mood. The patient is not nervous/anxious.        Objective:   Physical Exam  Constitutional: She appears well-developed and well-nourished. No distress.  obese and well appearing   HENT:  Head: Normocephalic and atraumatic.  Mouth/Throat: Oropharynx is clear and moist.  Eyes: Pupils are equal, round, and reactive to light. Conjunctivae and EOM are normal.  Neck: Normal range of motion. Neck supple. No JVD present. Carotid bruit is not present. No thyromegaly present.  Cardiovascular: Normal rate, regular rhythm, normal heart sounds and intact distal pulses. Exam reveals no gallop.  Pulmonary/Chest: Effort normal and breath sounds normal. No respiratory distress. She has no wheezes. She has no rales.  No crackles  Abdominal: Soft. Bowel sounds are normal. She exhibits no distension, no abdominal bruit and no mass. There is no tenderness.  Musculoskeletal: She exhibits no edema.  Red rom of knee affecting gait  Lymphadenopathy:    She has no cervical adenopathy.  Neurological: She is alert. She has normal reflexes. She displays normal reflexes. No cranial nerve deficit. Coordination normal.  Skin: Skin is warm and dry. No rash noted. No erythema. No pallor.  Psychiatric: She has a normal mood and affect.          Assessment & Plan:   Problem List Items Addressed This Visit      Cardiovascular and Mediastinum   Essential hypertension    bp improved with amlodipine Lower at home /whitecoat  bp in fair control at this time  BP Readings from Last 1 Encounters:  01/22/18 (!) 146/72  No changes needed Most recent labs reviewed  Disc lifstyle change with low sodium diet and exercise   Continue amlodipine- take 1/2 dose for low bp        Other   Anemia    Hx of chronic GI bleed  Not  improved with inc in oral iron May be a non absorber  Mild -no restrictions for surg but will make team aware  May need to consider iron inf in the future       High platelet count    Improved this draw  No clotting problems       Hyperlipidemia    Disc goals for lipids and reasons to control them Rev last labs with pt Rev low sat fat diet in detail  Triglycerides and significantly improved on fibrate-will continue this       Pre-op examination - Primary    Rev HTN/labs  Aware of mild anemia/on iron  HTN improved  EKG -no acute changes  Rev med allergies and no hx of anaphylaxis  No problems with anesthesia in the past  No restrictions for surgery        Relevant Orders   EKG 12-Lead (Completed)

## 2018-01-23 NOTE — Assessment & Plan Note (Signed)
Disc goals for lipids and reasons to control them Rev last labs with pt Rev low sat fat diet in detail  Triglycerides and significantly improved on fibrate-will continue this

## 2018-01-23 NOTE — Assessment & Plan Note (Signed)
Improved this draw  No clotting problems

## 2018-01-23 NOTE — Assessment & Plan Note (Signed)
Hx of chronic GI bleed  Not improved with inc in oral iron May be a non absorber  Mild -no restrictions for surg but will make team aware  May need to consider iron inf in the future

## 2018-01-23 NOTE — Assessment & Plan Note (Signed)
Rev HTN/labs  Aware of mild anemia/on iron  HTN improved  EKG -no acute changes  Rev med allergies and no hx of anaphylaxis  No problems with anesthesia in the past  No restrictions for surgery

## 2018-02-02 ENCOUNTER — Encounter
Admission: RE | Admit: 2018-02-02 | Discharge: 2018-02-02 | Disposition: A | Discharge status: Home / Self Care | Payer: Medicare Other | Source: Ambulatory Visit | Attending: Orthopedic Surgery | Admitting: Orthopedic Surgery

## 2018-02-02 ENCOUNTER — Other Ambulatory Visit: Payer: Self-pay

## 2018-02-02 DIAGNOSIS — Z01812 Encounter for preprocedural laboratory examination: Secondary | ICD-10-CM | POA: Insufficient documentation

## 2018-02-02 DIAGNOSIS — Z01810 Encounter for preprocedural cardiovascular examination: Secondary | ICD-10-CM | POA: Diagnosis present

## 2018-02-02 LAB — BASIC METABOLIC PANEL
ANION GAP: 13 (ref 5–15)
BUN: 23 mg/dL (ref 8–23)
CALCIUM: 10 mg/dL (ref 8.9–10.3)
CO2: 24 mmol/L (ref 22–32)
CREATININE: 1.07 mg/dL — AB (ref 0.44–1.00)
Chloride: 102 mmol/L (ref 98–111)
GFR calc non Af Amer: 52 mL/min — ABNORMAL LOW (ref >60)
Glucose, Bld: 100 mg/dL — ABNORMAL HIGH (ref 70–99)
Potassium: 4.2 mmol/L (ref 3.5–5.1)
SODIUM: 139 mmol/L (ref 135–145)

## 2018-02-02 LAB — CBC
HCT: 32.7 % — ABNORMAL LOW (ref 35.0–47.0)
Hemoglobin: 11.1 g/dL — ABNORMAL LOW (ref 12.0–16.0)
MCH: 28.4 pg (ref 26.0–34.0)
MCHC: 34 g/dL (ref 32.0–36.0)
MCV: 83.4 fL (ref 80.0–100.0)
PLATELETS: 428 10*3/uL (ref 150–440)
RBC: 3.92 MIL/uL (ref 3.80–5.20)
RDW: 17 % — AB (ref 11.5–14.5)
WBC: 8.2 10*3/uL (ref 3.6–11.0)

## 2018-02-02 NOTE — Pre-Procedure Instructions (Signed)
Met B results faxed to PCP. Dr. Loura Pardon [phone]. Fax confirmation received.

## 2018-02-02 NOTE — Patient Instructions (Signed)
Your procedure is scheduled on: Wednesday 02/10/18.  Report to DAY SURGERY DEPARTMENT LOCATED ON 2ND FLOOR MEDICAL MALL ENTRANCE. To find out your arrival time please call [phone] between 1PM - 3PM on Tuesday 02/09/18.  Remember: Instructions that are not followed completely may result in serious medical risk, up to and including death, or upon the discretion of your surgeon and anesthesiologist your surgery may need to be rescheduled.     _X__ 1. Do not eat food after midnight the night before your procedure.                 No gum chewing or hard candies. You may drink clear liquids up to 2 hours                 before you are scheduled to arrive for your surgery- DO not drink clear                 liquids within 2 hours of the start of your surgery.                 Clear Liquids include:  water, apple juice without pulp, clear carbohydrate                 drink such as Clearfast or Gatorade, Black Coffee or Tea (Do not add                 anything to coffee or tea).  __X__2.  On the morning of surgery brush your teeth with toothpaste and water, you                 may rinse your mouth with mouthwash if you wish.  Do not swallow any              toothpaste of mouthwash.     _X__ 3.  No Alcohol for 24 hours before or after surgery.   _X__ 4.  Do Not Smoke or use e-cigarettes For 24 Hours Prior to Your Surgery.                 Do not use any chewable tobacco products for at least 6 hours prior to                 surgery.  ____  5.  Bring all medications with you on the day of surgery if instructed.   __X__  6.  Notify your doctor if there is any change in your medical condition      (cold, fever, infections).     Do not wear jewelry, make-up, hairpins, clips or nail polish. Do not wear lotions, powders, or perfumes.  Do not shave 48 hours prior to surgery. Men may shave face and neck. Do not bring valuables to the hospital.    [facility] is not responsible for any belongings or  valuables.  Contacts, dentures/partials or body piercings may not be worn into surgery. Bring a case for your contacts, glasses or hearing aids, a denture cup will be supplied. Leave your suitcase in the car. After surgery it may be brought to your room. For patients admitted to the hospital, discharge time is determined by your treatment team.   Patients discharged the day of surgery will not be allowed to drive home.   Please read over the following fact sheets that you were given:   MRSA Information  __X__ Take these medicines the morning of surgery with A SIP OF WATER:  1. albuterol (PROVENTIL HFA;VENTOLIN HFA) 108 (90 Base) MCG/ACT inhaler  2. amLODipine (NORVASC) 5 MG tablet  3. fenofibrate 54 MG tablet  4.omeprazole (PRILOSEC) 20 MG capsule  5.  6.  ____ Fleet Enema (as directed)   __X__ Use CHG Soap/SAGE wipes as directed  _ X___ Use inhalers on the day of surgery. Also bring the inhaler with you to the hospital on the morning of surgery.  __X__ Stop metformin/Janumet/Farxiga 2 days prior to surgery    ____ Take 1/2 of usual insulin dose the night before surgery. No insulin the morning          of surgery.   __X__ Stop Blood Thinners Coumadin/Plavix/Xarelto/Pleta/Pradaxa/Eliquis/Effient/Aspirin Wednesday 02/03/18.  __X__ Stop Anti-inflammatories 7 days before surgery such as Advil, Ibuprofen, Motrin, BC or Goodies Powder, Naprosyn, Naproxen, Aleve, Aspirin, Meloxicam. May take Tylenol if needed for pain or discomfort.   __X__ Stop all herbal supplements, fish oil or vitamin E until after surgery. Wednesday 02/03/18.   ____ Bring C-Pap to the hospital.

## 2018-02-09 MED ORDER — CEFAZOLIN SODIUM-DEXTROSE 2-4 GM/100ML-% IV SOLN
2.0000 g | INTRAVENOUS | Status: AC
  Administered 2018-02-10: 2 g via INTRAVENOUS

## 2018-02-10 ENCOUNTER — Ambulatory Visit
Admission: RE | Admit: 2018-02-10 | Discharge: 2018-02-10 | Disposition: A | Discharge status: Home / Self Care | Payer: Medicare Other | Source: Ambulatory Visit | Attending: Orthopedic Surgery | Admitting: Orthopedic Surgery

## 2018-02-10 ENCOUNTER — Ambulatory Visit: Payer: Medicare Other | Admitting: Anesthesiology

## 2018-02-10 ENCOUNTER — Encounter: Payer: Self-pay | Admitting: *Deleted

## 2018-02-10 ENCOUNTER — Other Ambulatory Visit: Payer: Self-pay

## 2018-02-10 ENCOUNTER — Encounter
Admission: RE | Disposition: A | Discharge status: Home / Self Care | Payer: Self-pay | Source: Ambulatory Visit | Attending: Orthopedic Surgery

## 2018-02-10 DIAGNOSIS — Z853 Personal history of malignant neoplasm of breast: Secondary | ICD-10-CM | POA: Diagnosis not present

## 2018-02-10 DIAGNOSIS — E785 Hyperlipidemia, unspecified: Secondary | ICD-10-CM | POA: Insufficient documentation

## 2018-02-10 DIAGNOSIS — Z79899 Other long term (current) drug therapy: Secondary | ICD-10-CM | POA: Diagnosis not present

## 2018-02-10 DIAGNOSIS — K219 Gastro-esophageal reflux disease without esophagitis: Secondary | ICD-10-CM | POA: Insufficient documentation

## 2018-02-10 DIAGNOSIS — S83282A Other tear of lateral meniscus, current injury, left knee, initial encounter: Secondary | ICD-10-CM | POA: Insufficient documentation

## 2018-02-10 DIAGNOSIS — I10 Essential (primary) hypertension: Secondary | ICD-10-CM | POA: Insufficient documentation

## 2018-02-10 DIAGNOSIS — S83242A Other tear of medial meniscus, current injury, left knee, initial encounter: Secondary | ICD-10-CM | POA: Diagnosis not present

## 2018-02-10 DIAGNOSIS — X58XXXA Exposure to other specified factors, initial encounter: Secondary | ICD-10-CM | POA: Diagnosis not present

## 2018-02-10 DIAGNOSIS — E119 Type 2 diabetes mellitus without complications: Secondary | ICD-10-CM | POA: Diagnosis not present

## 2018-02-10 DIAGNOSIS — Z7982 Long term (current) use of aspirin: Secondary | ICD-10-CM | POA: Insufficient documentation

## 2018-02-10 DIAGNOSIS — Y92009 Unspecified place in unspecified non-institutional (private) residence as the place of occurrence of the external cause: Secondary | ICD-10-CM | POA: Insufficient documentation

## 2018-02-10 DIAGNOSIS — Z7984 Long term (current) use of oral hypoglycemic drugs: Secondary | ICD-10-CM | POA: Insufficient documentation

## 2018-02-10 HISTORY — PX: SYNOVECTOMY: SHX5180

## 2018-02-10 HISTORY — PX: KNEE ARTHROSCOPY WITH MEDIAL MENISECTOMY: SHX5651

## 2018-02-10 HISTORY — PX: CHONDROPLASTY: SHX5177

## 2018-02-10 LAB — GLUCOSE, CAPILLARY
GLUCOSE-CAPILLARY: 124 mg/dL — AB (ref 70–99)
Glucose-Capillary: 138 mg/dL — ABNORMAL HIGH (ref 70–99)

## 2018-02-10 SURGERY — ARTHROSCOPY, KNEE, WITH MEDIAL MENISCECTOMY
Anesthesia: General | Site: Knee | Laterality: Left | Wound class: Clean

## 2018-02-10 MED ORDER — FENTANYL CITRATE (PF) 100 MCG/2ML IJ SOLN
INTRAMUSCULAR | Status: AC
  Filled 2018-02-10: qty 2

## 2018-02-10 MED ORDER — FENTANYL CITRATE (PF) 100 MCG/2ML IJ SOLN: 25.0000 ug | INTRAMUSCULAR | Status: DC | PRN

## 2018-02-10 MED ORDER — SODIUM CHLORIDE 0.9 % IV SOLN
INTRAVENOUS | Status: DC
  Administered 2018-02-10: 10:00:00 via INTRAVENOUS

## 2018-02-10 MED ORDER — LACTATED RINGERS IV SOLN
INTRAVENOUS | Status: DC | PRN
  Administered 2018-02-10: 1200 mL

## 2018-02-10 MED ORDER — ONDANSETRON HCL 4 MG/2ML IJ SOLN: 4.0000 mg | Freq: Once | INTRAMUSCULAR | Status: DC | PRN

## 2018-02-10 MED ORDER — METOCLOPRAMIDE HCL 5 MG/ML IJ SOLN: 5.0000 mg | Freq: Three times a day (TID) | INTRAMUSCULAR | Status: DC | PRN

## 2018-02-10 MED ORDER — GABAPENTIN 300 MG PO CAPS
300.0000 mg | ORAL_CAPSULE | Freq: Once | ORAL | Status: AC
  Administered 2018-02-10: 300 mg via ORAL

## 2018-02-10 MED ORDER — GABAPENTIN 300 MG PO CAPS
ORAL_CAPSULE | ORAL | Status: AC
  Filled 2018-02-10: qty 1

## 2018-02-10 MED ORDER — LIDOCAINE HCL (CARDIAC) PF 100 MG/5ML IV SOSY
PREFILLED_SYRINGE | INTRAVENOUS | Status: DC | PRN
  Administered 2018-02-10: 100 mg via INTRAVENOUS

## 2018-02-10 MED ORDER — ROPIVACAINE HCL 5 MG/ML IJ SOLN
INTRAMUSCULAR | Status: DC | PRN
  Administered 2018-02-10: 20 mL

## 2018-02-10 MED ORDER — MORPHINE SULFATE (PF) 4 MG/ML IV SOLN
INTRAVENOUS | Status: AC
  Filled 2018-02-10: qty 1

## 2018-02-10 MED ORDER — BUPIVACAINE-EPINEPHRINE (PF) 0.25% -1:200000 IJ SOLN
INTRAMUSCULAR | Status: DC | PRN
  Administered 2018-02-10: 10 mL via PERINEURAL

## 2018-02-10 MED ORDER — LACTATED RINGERS IV SOLN
INTRAVENOUS | Status: DC | PRN
  Administered 2018-02-10: 11:00:00 via INTRAVENOUS

## 2018-02-10 MED ORDER — PROPOFOL 10 MG/ML IV BOLUS
INTRAVENOUS | Status: DC | PRN
  Administered 2018-02-10: 140 mg via INTRAVENOUS

## 2018-02-10 MED ORDER — ONDANSETRON HCL 4 MG/2ML IJ SOLN: 4.0000 mg | Freq: Four times a day (QID) | INTRAMUSCULAR | Status: DC | PRN

## 2018-02-10 MED ORDER — ONDANSETRON HCL 4 MG/2ML IJ SOLN
INTRAMUSCULAR | Status: DC | PRN
  Administered 2018-02-10: 4 mg via INTRAVENOUS

## 2018-02-10 MED ORDER — MORPHINE SULFATE (PF) 4 MG/ML IV SOLN: 1.0000 mg | INTRAVENOUS | Status: DC | PRN

## 2018-02-10 MED ORDER — ONDANSETRON HCL 4 MG PO TABS: 4.0000 mg | ORAL_TABLET | Freq: Four times a day (QID) | ORAL | Status: DC | PRN

## 2018-02-10 MED ORDER — FENTANYL CITRATE (PF) 100 MCG/2ML IJ SOLN
INTRAMUSCULAR | Status: DC | PRN
  Administered 2018-02-10 (×2): 50 ug via INTRAVENOUS

## 2018-02-10 MED ORDER — CHLORHEXIDINE GLUCONATE 4 % EX LIQD: 60.0000 mL | Freq: Once | CUTANEOUS | Status: DC

## 2018-02-10 MED ORDER — DOCUSATE SODIUM 100 MG PO CAPS: 100.0000 mg | ORAL_CAPSULE | Freq: Every day | ORAL | 2 refills | Status: AC | PRN

## 2018-02-10 MED ORDER — ACETAMINOPHEN 500 MG PO TABS
ORAL_TABLET | ORAL | Status: AC
  Filled 2018-02-10: qty 2

## 2018-02-10 MED ORDER — CEFAZOLIN SODIUM-DEXTROSE 2-4 GM/100ML-% IV SOLN
INTRAVENOUS | Status: AC
  Filled 2018-02-10: qty 100

## 2018-02-10 MED ORDER — PHENYLEPHRINE HCL 10 MG/ML IJ SOLN
INTRAMUSCULAR | Status: DC | PRN
  Administered 2018-02-10 (×4): 100 ug via INTRAVENOUS

## 2018-02-10 MED ORDER — ACETAMINOPHEN 500 MG PO TABS
1000.0000 mg | ORAL_TABLET | Freq: Once | ORAL | Status: AC
  Administered 2018-02-10: 1000 mg via ORAL

## 2018-02-10 MED ORDER — OXYCODONE-ACETAMINOPHEN 5-325 MG PO TABS: 1.0000 | ORAL_TABLET | ORAL | Status: DC | PRN

## 2018-02-10 MED ORDER — METHYLPREDNISOLONE ACETATE 40 MG/ML IJ SUSP
INTRAMUSCULAR | Status: DC | PRN
  Administered 2018-02-10: 40 mg

## 2018-02-10 MED ORDER — METOCLOPRAMIDE HCL 10 MG PO TABS: 5.0000 mg | ORAL_TABLET | Freq: Three times a day (TID) | ORAL | Status: DC | PRN

## 2018-02-10 MED ORDER — LACTATED RINGERS IV SOLN: INTRAVENOUS | Status: DC

## 2018-02-10 MED ORDER — HYDROCODONE-ACETAMINOPHEN 5-325 MG PO TABS: 1.0000 | ORAL_TABLET | Freq: Four times a day (QID) | ORAL | 0 refills | Status: DC | PRN

## 2018-02-10 SURGICAL SUPPLY — 38 items
ADAPTER IRRIG TUBE 2 SPIKE SOL (ADAPTER) IMPLANT
BLADE EXCALIBUR 4.0X13 (MISCELLANEOUS) ×2 IMPLANT
BLADE FULL RADIUS 3.5 (BLADE) IMPLANT
BLADE INCISOR PLUS 4.5 (BLADE) IMPLANT
BLADE SHAVER 4.5 DBL SERAT CV (CUTTER) IMPLANT
BLADE SHAVER TORPEDO 4X13 (MISCELLANEOUS) ×2 IMPLANT
BLADE SURG SZ11 CARB STEEL (BLADE) ×2 IMPLANT
BRUSH SCRUB EZ  4% CHG (MISCELLANEOUS) ×2
BRUSH SCRUB EZ 4% CHG (MISCELLANEOUS) ×2 IMPLANT
CHLORAPREP W/TINT 26ML (MISCELLANEOUS) ×2 IMPLANT
COOLER POLAR GLACIER W/PUMP (MISCELLANEOUS) ×2 IMPLANT
DRAPE IMP U-DRAPE 54X76 (DRAPES) ×2 IMPLANT
GAUZE PETRO XEROFOAM 1X8 (MISCELLANEOUS) ×2 IMPLANT
GAUZE SPONGE 4X4 12PLY STRL (GAUZE/BANDAGES/DRESSINGS) ×2 IMPLANT
GLOVE INDICATOR 8.0 STRL GRN (GLOVE) ×2 IMPLANT
GLOVE SURG ORTHO 8.0 STRL STRW (GLOVE) ×2 IMPLANT
GOWN STRL REUS W/ TWL LRG LVL3 (GOWN DISPOSABLE) ×1 IMPLANT
GOWN STRL REUS W/ TWL XL LVL3 (GOWN DISPOSABLE) ×1 IMPLANT
GOWN STRL REUS W/TWL LRG LVL3 (GOWN DISPOSABLE) ×1
GOWN STRL REUS W/TWL XL LVL3 (GOWN DISPOSABLE) ×2
IV LACTATED RINGER IRRG 3000ML (IV SOLUTION) ×4
IV LR IRRIG 3000ML ARTHROMATIC (IV SOLUTION) ×4 IMPLANT
KIT TURNOVER KIT A (KITS) ×2 IMPLANT
MANIFOLD NEPTUNE II (INSTRUMENTS) ×2 IMPLANT
MAT BLUE FLOOR 46X72 FLO (MISCELLANEOUS) ×2 IMPLANT
NEEDLE SPNL 20GX3.5 QUINCKE YW (NEEDLE) ×2 IMPLANT
PACK ARTHROSCOPY KNEE (MISCELLANEOUS) ×2 IMPLANT
PAD ABD DERMACEA PRESS 5X9 (GAUZE/BANDAGES/DRESSINGS) ×4 IMPLANT
PAD WRAPON POLAR KNEE (MISCELLANEOUS) ×1 IMPLANT
SET ADAPTER IRRIG ARTHO 72IN Y (ADAPTER) ×2 IMPLANT
SUT ETHILON 4-0 (SUTURE) ×2
SUT ETHILON 4-0 FS2 18XMFL BLK (SUTURE) ×1
SUTURE ETHLN 4-0 FS2 18XMF BLK (SUTURE) ×1 IMPLANT
SYR 50ML LL SCALE MARK (SYRINGE) ×2 IMPLANT
TUBING ARTHRO INFLOW-ONLY STRL (TUBING) IMPLANT
TUBING REDEUCE PUMP W/CON 8IN (MISCELLANEOUS) ×2 IMPLANT
WAND HAND CNTRL MULTIVAC 90 (MISCELLANEOUS) IMPLANT
WRAPON POLAR PAD KNEE (MISCELLANEOUS) ×2

## 2018-02-10 NOTE — H&P (Signed)
The patient has been re-examined, and the chart reviewed, and there have been no interval changes to the documented history and physical.  Plan a left knee arthroscopy today.  Anesthesia is not consulted regarding a peripheral nerve block for post-operative pain.  The risks, benefits, and alternatives have been discussed at length, and the patient is willing to proceed.    

## 2018-02-10 NOTE — Transfer of Care (Addendum)
Immediate Anesthesia Transfer of Care Note  Patient: Martha Hamilton  Procedure(s) Performed: KNEE ARTHROSCOPY WITH MEDIAL and LATERAL MENISECTOMY (Left Knee) SYNOVECTOMY (Left Knee) CHONDROPLASTY (Left Knee)  Patient Location: PACU  Anesthesia Type:General  Level of Consciousness: awake  Airway & Oxygen Therapy: Patient Spontanous Breathing  Post-op Assessment: Report given to RN  Post vital signs: stable  Last Vitals:  Vitals Value Taken Time  BP    Temp    Pulse 96 02/10/2018 11:57 AM  Resp    SpO2 92 % 02/10/2018 11:57 AM  Vitals shown include unvalidated device data.  Last Pain:  Vitals:   02/10/18 0930  TempSrc: Temporal  PainSc: 0-No pain         Complications: No apparent anesthesia complications

## 2018-02-10 NOTE — Discharge Instructions (Addendum)
Post Op Home Instructions for Knee Arthroscopy  1) Do not sit for longer than 1 hour at a time with your leg dangling down.  You should have your legs elevated (higher than your heart) in a recliner chair or couch.  2) You may be up walking around as tolerated but should take periodic breaks to elevate your legs.  Discontinue use of crutches when you feel you are able to walk without pain or a limp.  3) Work on gentle bending and straightening of the knee.  4) You may remove the Ace wrap and dressings two days after surgery.  Place band aids over the incision sites.  5) You may shower after you remove the surgical dressing.  You do not need to cover the incision with plastic wrap.  The incision can get wet, but do not submerge under water.  After your sutures have been removed, you should wait 24 hours before submerging incision under water.    AMBULATORY SURGERY  DISCHARGE INSTRUCTIONS   1) The drugs that you were given will stay in your system until tomorrow so for the next 24 hours you should not:  A) Drive an automobile B) Make any legal decisions C) Drink any alcoholic beverage   2) You may resume regular meals tomorrow.  Today it is better to start with liquids and gradually work up to solid foods.  You may eat anything you prefer, but it is better to start with liquids, then soup and crackers, and gradually work up to solid foods.   3) Please notify your doctor immediately if you have any unusual bleeding, trouble breathing, redness and pain at the surgery site, drainage, fever, or pain not relieved by medication.    4) Additional Instructions:        Please contact your physician with any problems or Same Day Surgery at [phone], Monday through Friday 6 am to 4 pm, or [address] at [facility] number at [phone].  6) Pain medication can cause constipation.  You should increase your fluid intake, increase your intake of high fiber foods and/or take  Metamucil as needed for constipation.  7) Continue your physical therapy exercises, as shown at the office, at least twice daily.  You should set up outpatient physical therapy and start within the first week after surgery.  8) Continue to use your Polar Pack continuously for 2-3 days after surgery.  After you remove the surgical dressing, it is a good idea to use your Polar Pack or ice pack for 30 minutes after doing your exercises to reduce swelling.  9) Do not be surprised if you have increased pain at night.  This usually means you have been a little too active during the day and need to reduce your activities.  10) If you develop lower extremity swelling that does not improve after a night of elevation, please call the office.  This could be an early sign of a blood clot.  Please call with any questions at [phone]

## 2018-02-10 NOTE — Anesthesia Preprocedure Evaluation (Signed)
Anesthesia Evaluation  Patient identified by MRN, date of birth, ID band Patient awake    Reviewed: Allergy & Precautions, H&P , NPO status , Patient's Chart, lab work & pertinent test results, reviewed documented beta blocker date and time   Airway Mallampati: II  TM Distance: >3 FB Neck ROM: full    Dental  (+) Teeth Intact   Pulmonary neg pulmonary ROS,    Pulmonary exam normal        Cardiovascular Exercise Tolerance: Good hypertension, negative cardio ROS Normal cardiovascular exam Rate:Normal     Neuro/Psych negative neurological ROS  negative psych ROS   GI/Hepatic negative GI ROS, Neg liver ROS, GERD  ,  Endo/Other  negative endocrine ROSdiabetes  Renal/GU negative Renal ROS  negative genitourinary   Musculoskeletal   Abdominal   Peds  Hematology negative hematology ROS (+) anemia ,   Anesthesia Other Findings   Reproductive/Obstetrics negative OB ROS                             Anesthesia Physical Anesthesia Plan  ASA: III  Anesthesia Plan: General LMA   Post-op Pain Management:  Regional for Post-op pain   Induction:   PONV Risk Score and Plan:   Airway Management Planned:   Additional Equipment:   Intra-op Plan:   Post-operative Plan:   Informed Consent: I have reviewed the patients History and Physical, chart, labs and discussed the procedure including the risks, benefits and alternatives for the proposed anesthesia with the patient or authorized representative who has indicated his/her understanding and acceptance.     Plan Discussed with: CRNA  Anesthesia Plan Comments:         Anesthesia Quick Evaluation

## 2018-02-10 NOTE — Op Note (Signed)
  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 female with a diagnosis of [place] who failed conservative management and elected for surgical management.    The risks benefits and alternatives were discussed with the patient preoperatively including the risks of infection, bleeding, nerve injury, knee stiffness, persistent pain, osteoarthritis and the need for further surgery. Medical  risks include DVT and pulmonary embolism, myocardial infarction, stroke, pneumonia, respiratory failure and death. The patient understood these risks and wished to proceed.   OPERATIVE FINDINGS: Tear of the root of the medial meniscus, degenerative tearing of the lateral meniscus, Grade 2 chondromalacia of the medial and patellofemoral compartment, synovitic synovitis of all 3 compartments. ACL and PCL were intact. There was lateral subluxation of the patella. No loose bodies were identified within the medial or lateral gutters.  OPERATIVE PROCEDURE: Patient was met in the preoperative area. The operative extremity was signed with my initials according the hospital's correct site of surgery protocol.  The patient was brought to the operating room and placed supine on the operative table. General anesthesia was administered. The patient was prepped and draped in a sterile fashion.  A timeout was performed to verify the patient's name, date of birth, medical record number, correct site of surgery  correct procedure to be performed. It was also used to verify the patient received antibiotics that all appropriate instruments, and radiographic studies were available in the room. Once all in attendance were in agreement, the case began.  Proposed arthroscopy incisions were drawn out with a surgical marker. These were pre-injected with 0.5% marcaine with epinephrine. An 11 blade was used to establish an inferior lateral and inferomedial portals. The inferomedial portal was created using a 18-gauge spinal needle under direct visualization.  A full diagnostic examination of the knee was performed including the suprapatellar pouch, patellofemoral joint, medial lateral compartments as well as the medial lateral gutters, the intercondylar notch in the posterior knee.  Patient had both the medial and lateral meniscal tear treated with a 4-0 resector shaver blade and straight duckbill basket. The meniscus was debrided until a stable rim was achieved. A chondroplasty of the medial femoral condyle and undersurface of patella was also performed using a 4-0 resector shaver blade. A partial synovectomy was also performed using a 4-0 resector shaver blade.  The knee was then copiously lavaged. All arthroscopic instruments were removed. The 2 arthroscopy portals were closed with 4-0 nylon. A dry sterile and compressive dressing was applied. The patient was brought to the PACU in stable condition. I was scrubbed and present for the entire case and all sharp and instrument counts were correct at the conclusion the case. I spoke with the patient's family postoperatively to let them know the case was performed without complication and the patient was stable in the recovery room.  Kurtis Bushman, MD

## 2018-02-10 NOTE — Anesthesia Post-op Follow-up Note (Signed)
Anesthesia QCDR form completed.        

## 2018-02-12 NOTE — Anesthesia Postprocedure Evaluation (Signed)
Anesthesia Post Note  Patient: Martha Hamilton  Procedure(s) Performed: KNEE ARTHROSCOPY WITH MEDIAL and LATERAL MENISECTOMY (Left Knee) SYNOVECTOMY (Left Knee) CHONDROPLASTY (Left Knee)  Anesthesia Type: General     Last Vitals:  Vitals:   02/10/18 1229 02/10/18 1246  BP: 126/62 (!) 115/58  Pulse: 93 85  Resp: 16 16  Temp: 36.7 C   SpO2: 96% 99%    Last Pain:  Vitals:   02/10/18 1246  TempSrc:   PainSc: 0-No pain                 Molli Barrows

## 2018-03-22 ENCOUNTER — Other Ambulatory Visit: Payer: Self-pay | Admitting: Family Medicine

## 2018-03-22 DIAGNOSIS — Z1231 Encounter for screening mammogram for malignant neoplasm of breast: Secondary | ICD-10-CM

## 2018-04-08 ENCOUNTER — Ambulatory Visit (INDEPENDENT_AMBULATORY_CARE_PROVIDER_SITE_OTHER): Payer: Medicare Other

## 2018-04-08 ENCOUNTER — Ambulatory Visit: Payer: Medicare Other

## 2018-04-08 DIAGNOSIS — Z23 Encounter for immunization: Secondary | ICD-10-CM

## 2018-04-17 ENCOUNTER — Other Ambulatory Visit: Payer: Self-pay | Admitting: Family Medicine

## 2018-04-30 ENCOUNTER — Ambulatory Visit
Admission: RE | Admit: 2018-04-30 | Discharge: 2018-04-30 | Disposition: A | Discharge status: Home / Self Care | Payer: Medicare Other | Source: Ambulatory Visit | Attending: Family Medicine | Admitting: Family Medicine

## 2018-04-30 DIAGNOSIS — Z1231 Encounter for screening mammogram for malignant neoplasm of breast: Secondary | ICD-10-CM

## 2018-05-05 LAB — HM DIABETES EYE EXAM

## 2018-05-11 ENCOUNTER — Encounter: Payer: Self-pay | Admitting: Family Medicine

## 2018-05-11 ENCOUNTER — Ambulatory Visit: Payer: Medicare Other | Admitting: Family Medicine

## 2018-05-11 VITALS — BP 136/82 | HR 99 | Temp 97.6°F | Ht 59.5 in | Wt 159.8 lb

## 2018-05-11 DIAGNOSIS — E782 Mixed hyperlipidemia: Secondary | ICD-10-CM

## 2018-05-11 DIAGNOSIS — I10 Essential (primary) hypertension: Secondary | ICD-10-CM | POA: Diagnosis not present

## 2018-05-11 DIAGNOSIS — E119 Type 2 diabetes mellitus without complications: Secondary | ICD-10-CM | POA: Diagnosis not present

## 2018-05-11 DIAGNOSIS — Z87898 Personal history of other specified conditions: Secondary | ICD-10-CM

## 2018-05-11 LAB — POCT GLYCOSYLATED HEMOGLOBIN (HGB A1C): HEMOGLOBIN A1C: 5.5 % (ref 4.0–5.6)

## 2018-05-11 MED ORDER — MECLIZINE HCL 25 MG PO TABS: 25.0000 mg | ORAL_TABLET | Freq: Three times a day (TID) | ORAL | 3 refills | Status: DC | PRN

## 2018-05-11 NOTE — Assessment & Plan Note (Signed)
bp in fair control at this time  BP Readings from Last 1 Encounters:  05/11/18 136/82   No changes needed Most recent labs reviewed  Disc lifstyle change with low sodium diet and exercise

## 2018-05-11 NOTE — Patient Instructions (Addendum)
A1C is down to 5.5  Blood pressure is also in the normal range  Work on exercise / weight loss  Keep eating well   We will see you in June   Sign release at check out to get your eye exam report   I sent in meclizine

## 2018-05-11 NOTE — Assessment & Plan Note (Signed)
Disc goals for lipids and reasons to control them Rev last labs with pt Rev low sat fat diet in detail Diet is improved Intol of statins and niacin Fenofibrate helps with trig control (last 301)

## 2018-05-11 NOTE — Progress Notes (Signed)
Subjective:    Patient ID: Martha Hamilton, female    DOB: 22-Nov-1950, 67 y.o.   MRN: [id number]  HPI Here for f/u of chronic health problems   Did well with knee surgery  Doing much better!  Is careful not to twist it - otherwise unlimited   Wt Readings from Last 3 Encounters:  05/11/18 159 lb 12 oz (72.5 kg)  02/10/18 157 lb (71.2 kg)  02/02/18 157 lb 12.8 oz (71.6 kg)  wt is fairly stable  31.73 kg/m   bp is stable today  No cp or palpitations or headaches or edema  No side effects to medicines  BP Readings from Last 3 Encounters:  05/11/18 136/82  02/10/18 (!) 115/58  02/02/18 (!) 164/90     Is able to exercise now   Diabetes Home sugar results - no highs or lows  DM diet - fair / low appetite at times (unsure why)  Exercise - walking/ also her PT for knee  Symptoms-none  A1C last  Lab Results  Component Value Date   HGBA1C 6.4 12/21/2017  due for lab today  Results for orders placed or performed in visit on 05/11/18  POCT glycosylated hemoglobin (Hb A1C)  Result Value Ref Range   Hemoglobin A1C 5.5 4.0 - 5.6 %   HbA1c POC (<> result, manual entry)     HbA1c, POC (prediabetic range)     HbA1c, POC (controlled diabetic range)     improved- great A1C today - is 5.5  Does not crave or eat sweets and watches carbs  For protein- baked chicken  Some nuts also  occ cereal with milk  Egg beaters  No problems with medications  Renal protection arb  Last eye exam- last Wednesday 10/23 -hx of retinopathy   Hyperlipidemia Lab Results  Component Value Date   CHOL 187 01/19/2018   HDL 46.80 01/19/2018   LDLCALC 51 10/21/2011   LDLDIRECT 107.0 01/19/2018   TRIG 301.0 (H) 01/19/2018   CHOLHDL 4 01/19/2018  fenofibrate currently  Intol of statins and niacin  occ inner ear/vertigo problems -when weather changes  Needs refill of meclizine to have on hand   Patient Active Problem List   Diagnosis Date Noted  . Pre-op examination 01/22/2018  . High  platelet count 12/25/2017  . Left knee pain 09/21/2017  . Low back pain 07/06/2017  . Welcome to Medicare preventive visit 12/17/2016  . Grief reaction 12/17/2016  . Estrogen deficiency 09/08/2016  . Muscle pain 02/08/2016  . Anemia 02/09/2013  . Abnormal EKG 07/27/2012  . Special screening for malignant neoplasms, colon 04/29/2011  . Gynecological examination 04/29/2011  . Routine general medical examination at a health care facility 04/20/2011  . OSTEOARTHRITIS, HANDS, BILATERAL 10/31/2008  . Diabetes type 2, controlled ([place]) 07/10/2008  . Hyperlipidemia 01/26/2008  . Essential hypertension 01/26/2008  . ALLERGIC RHINITIS 12/07/2007   Past Medical History:  Diagnosis Date  . Allergic rhinitis   . Breast cancer ([place]) 1996   right breast  . DM2 (diabetes mellitus, type 2) ([place])   . GERD (gastroesophageal reflux disease)   . Hx of cardiovascular stress test    a. Lex MV 2/14:  EF 69%, no ischemia  . Hyperlipidemia   . Hypertension   . Iron deficiency anemia   . Obesity   . Osteoarthritis    hands  . Ulcer    Past Surgical History:  Procedure Laterality Date  . APPENDECTOMY  1976  . BREAST LUMPECTOMY Right 1996  right breast, lumpectomy w/nodes  . CHONDROPLASTY Left 02/10/2018   Procedure: CHONDROPLASTY;  Surgeon: Lovell Sheehan, MD;  Location: [facility];  Service: Orthopedics;  Laterality: Left;  Marland Kitchen GASTRIC RESTRICTION SURGERY     for reflux  . HYSTEROSCOPY W/D&C N/A 10/18/2012   Procedure: DILATATION AND CURETTAGE /HYSTEROSCOPY;  Surgeon: Mora Bellman, MD;  Location: [facility];  Service: Gynecology;  Laterality: N/A;  . KNEE ARTHROSCOPY WITH MEDIAL MENISECTOMY Left 02/10/2018   Procedure: KNEE ARTHROSCOPY WITH MEDIAL and LATERAL MENISECTOMY;  Surgeon: Lovell Sheehan, MD;  Location: [facility];  Service: Orthopedics;  Laterality: Left;  . POLYPECTOMY N/A 10/18/2012   Procedure: POLYPECTOMY;  Surgeon: Mora Bellman, MD;  Location: [facility];  Service: Gynecology;  Laterality:  N/A;  . SYNOVECTOMY Left 02/10/2018   Procedure: SYNOVECTOMY;  Surgeon: Lovell Sheehan, MD;  Location: [facility];  Service: Orthopedics;  Laterality: Left;   Social History   Tobacco Use  . Smoking status: Never Smoker  . Smokeless tobacco: Never Used  Substance Use Topics  . Alcohol use: Never    Alcohol/week: 0.0 standard drinks    Frequency: Never  . Drug use: No   Family History  Problem Relation Age of Onset  . Diabetes Mother   . Heart disease Mother        Pacemaker, CHF  . Hypertension Mother   . Kidney cancer Mother   . Stroke Father   . CAD Father 8       Died with MI  . Esophageal cancer Brother   . Heart disease Sister   . Diabetes Sister   . Colon cancer Neg Hx   . Rectal cancer Neg Hx   . Stomach cancer Neg Hx    Allergies  Allergen Reactions  . Cholestyramine Other (See Comments)    REACTION: Muscles tightened up, drew up.  . Lipitor [Atorvastatin] Other (See Comments)    Muscle cramping  . Niacin Nausea And Vomiting  . Simvastatin     Muscular pain   Current Outpatient Medications on File Prior to Visit  Medication Sig Dispense Refill  . acetaminophen (TYLENOL) 500 MG tablet Take 1,000 mg by mouth every 6 (six) hours as needed for mild pain.     Marland Kitchen albuterol (PROVENTIL HFA;VENTOLIN HFA) 108 (90 Base) MCG/ACT inhaler Inhale 2 puffs into the lungs every 4 (four) hours as needed for wheezing. 1 Inhaler 0  . amitriptyline (ELAVIL) 25 MG tablet Take 1 tablet (25 mg total) by mouth at bedtime. 90 tablet 3  . amLODipine (NORVASC) 5 MG tablet Take 1 tablet (5 mg total) by mouth daily. 30 tablet 3  . aspirin 81 MG tablet Take 81 mg by mouth at bedtime.     . calcium elemental as carbonate (TUMS ULTRA 1000) 400 MG chewable tablet Chew 1,000 mg by mouth daily as needed for heartburn.    . Cholecalciferol (VITAMIN D) 2000 units tablet Take 2,000 Units by mouth daily.    Marland Kitchen docusate sodium (COLACE) 100 MG capsule Take 1 capsule (100 mg total) by mouth daily as  needed. 30 capsule 2  . fenofibrate 54 MG tablet TAKE 1 TABLET(54 MG) BY MOUTH DAILY 30 tablet 6  . ferrous sulfate 325 (65 FE) MG tablet Take 1 tablet (325 mg total) by mouth 2 (two) times daily with a meal. 180 tablet 3  . glipiZIDE (GLUCOTROL XL) 2.5 MG 24 hr tablet Take 1 tablet (2.5 mg total) by mouth every evening. 90 tablet 3  . hydrochlorothiazide (HYDRODIURIL)  25 MG tablet Take 1 tablet (25 mg total) by mouth daily. 90 tablet 3  . HYDROcodone-acetaminophen (NORCO/VICODIN) 5-325 MG tablet Take 1 tablet by mouth every 6 (six) hours as needed for moderate pain or severe pain. 15 tablet 0  . loratadine (CLARITIN) 10 MG tablet Take 10 mg by mouth daily as needed for allergies.    Marland Kitchen losartan (COZAAR) 100 MG tablet Take 1 tablet (100 mg total) by mouth daily. 90 tablet 3  . meloxicam (MOBIC) 15 MG tablet TAKE 1 TABLET(15 MG) BY MOUTH DAILY WITH A MEAL FOR KNEE PAIN 90 tablet 0  . metFORMIN (GLUCOPHAGE) 1000 MG tablet TAKE 1 TABLET BY MOUTH TWO  TIMES DAILY WITH A MEAL 180 tablet 3  . omeprazole (PRILOSEC) 20 MG capsule Take 1 capsule (20 mg total) by mouth daily. 90 capsule 3  . ONETOUCH DELICA LANCETS 60V MISC Check blood sugar once  daily and as directed. Dx  E11.9 100 each 3  . ONETOUCH VERIO test strip TEST BLOOD SUGAR ONCE DAILY FOR DIABETES MELLITUS AS AS NEEDED. 100 each 3  . SALINE NASAL SPRAY NA Place 1 spray into the nose daily as needed (congestion).      No current facility-administered medications on file prior to visit.     Review of Systems  Constitutional: Negative for activity change, appetite change, fatigue, fever and unexpected weight change.  HENT: Negative for congestion, ear pain, rhinorrhea, sinus pressure and sore throat.   Eyes: Negative for pain, redness and visual disturbance.  Respiratory: Negative for cough, shortness of breath and wheezing.   Cardiovascular: Negative for chest pain and palpitations.  Gastrointestinal: Negative for abdominal pain, blood in  stool, constipation and diarrhea.  Endocrine: Negative for polydipsia and polyuria.  Genitourinary: Negative for dysuria, frequency and urgency.  Musculoskeletal: Negative for arthralgias, back pain and myalgias.  Skin: Negative for pallor and rash.  Allergic/Immunologic: Negative for environmental allergies.  Neurological: Negative for dizziness, syncope and headaches.  Hematological: Negative for adenopathy. Does not bruise/bleed easily.  Psychiatric/Behavioral: Negative for decreased concentration and dysphoric mood. The patient is not nervous/anxious.        Objective:   Physical Exam  Constitutional: She appears well-developed and well-nourished. No distress.  obese and well appearing   HENT:  Head: Normocephalic and atraumatic.  Mouth/Throat: Oropharynx is clear and moist.  Eyes: Pupils are equal, round, and reactive to light. Conjunctivae and EOM are normal.  Neck: Normal range of motion. Neck supple. No JVD present. Carotid bruit is not present. No thyromegaly present.  Cardiovascular: Normal rate, regular rhythm, normal heart sounds and intact distal pulses. Exam reveals no gallop.  Pulmonary/Chest: Effort normal and breath sounds normal. No respiratory distress. She has no wheezes. She has no rales.  No crackles  Abdominal: Soft. Bowel sounds are normal. She exhibits no distension, no abdominal bruit and no mass. There is no tenderness.  Musculoskeletal: She exhibits no edema.  Lymphadenopathy:    She has no cervical adenopathy.  Neurological: She is alert. She has normal reflexes. She displays normal reflexes. No cranial nerve deficit. Coordination normal.  Skin: Skin is warm and dry. No rash noted.  Psychiatric: She has a normal mood and affect.          Assessment & Plan:   Problem List Items Addressed This Visit      Cardiovascular and Mediastinum   Essential hypertension    bp in fair control at this time  BP Readings from Last 1 Encounters:  05/11/18  136/82   No changes needed Most recent labs reviewed  Disc lifstyle change with low sodium diet and exercise          Endocrine   Diabetes type 2, controlled ([facility]) - Primary    Improved Lab Results  Component Value Date   HGBA1C 5.5 05/11/2018   Metformin and glipizide No hypoglycemia  Able to move/exercise more since knee surgery  Eye and foot care discussed  Enc low glycemic diet  Arb for renal protection Sent for eye exam report -hx of retinopathy      Relevant Orders   POCT glycosylated hemoglobin (Hb A1C) (Completed)     Other   H/O vertigo    Often during weather change  Refilled meclizine to have on hand  No symptoms currently       Hyperlipidemia    Disc goals for lipids and reasons to control them Rev last labs with pt Rev low sat fat diet in detail Diet is improved Intol of statins and niacin Fenofibrate helps with trig control (last 301)

## 2018-05-11 NOTE — Assessment & Plan Note (Signed)
Often during weather change  Refilled meclizine to have on hand  No symptoms currently

## 2018-05-11 NOTE — Assessment & Plan Note (Signed)
Improved Lab Results  Component Value Date   HGBA1C 5.5 05/11/2018   Metformin and glipizide No hypoglycemia  Able to move/exercise more since knee surgery  Eye and foot care discussed  Enc low glycemic diet  Arb for renal protection Sent for eye exam report -hx of retinopathy

## 2018-05-17 ENCOUNTER — Encounter: Payer: Self-pay | Admitting: Ophthalmology

## 2018-05-24 ENCOUNTER — Encounter: Payer: Self-pay | Admitting: Family Medicine

## 2018-05-25 ENCOUNTER — Ambulatory Visit: Payer: Medicare Other | Admitting: Family Medicine

## 2018-05-25 ENCOUNTER — Encounter: Payer: Self-pay | Admitting: Family Medicine

## 2018-05-25 VITALS — BP 128/74 | HR 107 | Temp 98.2°F | Ht 59.5 in | Wt 157.5 lb

## 2018-05-25 DIAGNOSIS — R1013 Epigastric pain: Secondary | ICD-10-CM | POA: Diagnosis not present

## 2018-05-25 DIAGNOSIS — K2100 Gastro-esophageal reflux disease with esophagitis, without bleeding: Secondary | ICD-10-CM

## 2018-05-25 DIAGNOSIS — R1011 Right upper quadrant pain: Secondary | ICD-10-CM

## 2018-05-25 DIAGNOSIS — K219 Gastro-esophageal reflux disease without esophagitis: Secondary | ICD-10-CM | POA: Insufficient documentation

## 2018-05-25 MED ORDER — OMEPRAZOLE 20 MG PO CPDR: 20.0000 mg | DELAYED_RELEASE_CAPSULE | Freq: Two times a day (BID) | ORAL | 3 refills | Status: DC

## 2018-05-25 NOTE — Assessment & Plan Note (Addendum)
With nausea Pain did rad to R shoulder (episode on thurs) Food intol   [place] abd ordered   If symptoms return or persist will alert [place] and go to ED

## 2018-05-25 NOTE — Assessment & Plan Note (Signed)
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 Fundiplication)  Now more problems over the past week with heartburn/bloating/food intolerance  Inc omeprazole to bid Stop meloxicam or other nsaids   Also ruq pain episode=ordered US abd

## 2018-05-25 NOTE — Patient Instructions (Signed)
We will order an abdominal ultrasound to look for gallstones or other abnormalities  Stop meloxicam Increase your omeprazole to twice daily (I sent in a new px since you will run out sooner) For now -stick to a bland diet  If suddenly worse- let us know/ go to ED if needed

## 2018-05-25 NOTE — Progress Notes (Signed)
Subjective:    Patient ID: Martha Hamilton, female    DOB: 07-18-1950, 67 y.o.   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 Dyspepsia 05/25/2018  . GERD (gastroesophageal reflux disease) 05/25/2018  . Abdominal pain, RUQ 05/25/2018  . H/O vertigo 05/11/2018  . Pre-op examination 01/22/2018  . High platelet count 12/25/2017  . Left knee pain 09/21/2017  . Low back pain 07/06/2017  . Welcome to Medicare preventive visit 12/17/2016  . Grief reaction 12/17/2016  . Estrogen deficiency 09/08/2016  . Muscle pain 02/08/2016  . Anemia 02/09/2013  . Abnormal EKG 07/27/2012  . Special screening for malignant neoplasms, colon 04/29/2011  . Gynecological examination 04/29/2011    . Routine general medical examination at a health care facility 04/20/2011  . OSTEOARTHRITIS, HANDS, BILATERAL 10/31/2008  . Diabetes type 2, controlled ([place]) 07/10/2008  . Hyperlipidemia 01/26/2008  . Essential hypertension 01/26/2008  . ALLERGIC RHINITIS 12/07/2007   Past Medical History:  Diagnosis Date  . Allergic rhinitis   . Breast cancer ([place]) 1996   right breast  . DM2 (diabetes mellitus, type 2) ([place])   . GERD (gastroesophageal reflux disease)   . Hx of cardiovascular stress test    a. Lex MV 2/14:  EF 69%, no ischemia  . Hyperlipidemia   . Hypertension   . Iron deficiency anemia   . Obesity   . Osteoarthritis    hands  . Ulcer    Past Surgical History:  Procedure Laterality Date  . APPENDECTOMY  1976  . BREAST LUMPECTOMY Right 1996   right breast, lumpectomy w/nodes  . CHONDROPLASTY Left 02/10/2018   Procedure: CHONDROPLASTY;  Surgeon: Lovell Sheehan, MD;  Location: [facility];  Service: Orthopedics;  Laterality: Left;  Marland Kitchen GASTRIC RESTRICTION SURGERY     for reflux  . HYSTEROSCOPY W/D&C N/A 10/18/2012   Procedure: DILATATION AND CURETTAGE /HYSTEROSCOPY;  Surgeon: Mora Bellman, MD;  Location: [facility];  Service: Gynecology;  Laterality: N/A;  . KNEE ARTHROSCOPY WITH MEDIAL MENISECTOMY Left 02/10/2018   Procedure: KNEE  ARTHROSCOPY WITH MEDIAL and LATERAL MENISECTOMY;  Surgeon: Lovell Sheehan, MD;  Location: [facility];  Service: Orthopedics;  Laterality: Left;  . POLYPECTOMY N/A 10/18/2012   Procedure: POLYPECTOMY;  Surgeon: Mora Bellman, MD;  Location: [facility];  Service: Gynecology;  Laterality: N/A;  . SYNOVECTOMY Left 02/10/2018   Procedure: SYNOVECTOMY;  Surgeon: Lovell Sheehan, MD;  Location: [facility];  Service: Orthopedics;  Laterality: Left;   Social History   Tobacco Use  . Smoking status: Never Smoker  . Smokeless tobacco: Never Used  Substance Use Topics  . Alcohol use: Never    Alcohol/week: 0.0 standard drinks    Frequency: Never  . Drug use: No    Family History  Problem Relation Age of Onset  . Diabetes Mother   . Heart disease Mother        Pacemaker, CHF  . Hypertension Mother   . Kidney cancer Mother   . Stroke Father   . CAD Father 19       Died with MI  . Esophageal cancer Brother   . Heart disease Sister   . Diabetes Sister   . Colon cancer Neg Hx   . Rectal cancer Neg Hx   . Stomach cancer Neg Hx    Allergies  Allergen Reactions  . Cholestyramine Other (See Comments)    REACTION: Muscles tightened up, drew up.  . Lipitor [Atorvastatin] Other (See Comments)    Muscle cramping  . Niacin Nausea And Vomiting  . Simvastatin     Muscular pain   Current Outpatient Medications on File Prior to Visit  Medication Sig Dispense Refill  . acetaminophen (TYLENOL) 500 MG tablet Take 1,000 mg by mouth every 6 (six) hours as needed for mild pain.     Marland Kitchen albuterol (PROVENTIL HFA;VENTOLIN HFA) 108 (90 Base) MCG/ACT inhaler Inhale 2 puffs into the lungs every 4 (four) hours as needed for wheezing. 1 Inhaler 0  . amitriptyline (ELAVIL) 25 MG tablet Take 1 tablet (25 mg total) by mouth at bedtime. 90 tablet 3  . amLODipine (NORVASC) 5 MG tablet Take 1 tablet (5 mg total) by mouth daily. 30 tablet 3  . aspirin 81 MG tablet Take 81 mg by mouth at bedtime.     . calcium elemental as carbonate (TUMS ULTRA 1000) 400 MG chewable tablet Chew 1,000 mg by mouth daily as needed for heartburn.    . Cholecalciferol (VITAMIN D) 2000 units tablet Take 2,000 Units by mouth daily.    Marland Kitchen docusate sodium (COLACE) 100 MG capsule Take 1 capsule (100 mg total) by mouth daily as needed. 30 capsule 2  . fenofibrate 54 MG tablet TAKE 1 TABLET(54 MG) BY MOUTH DAILY 30 tablet 6  . ferrous sulfate 325 (65 FE) MG tablet Take 1 tablet (325 mg total) by mouth 2 (two) times daily with a meal. 180 tablet 3  . glipiZIDE (GLUCOTROL XL) 2.5 MG 24 hr tablet Take 1 tablet (2.5 mg total) by mouth every evening. 90 tablet 3  . hydrochlorothiazide (HYDRODIURIL) 25 MG  tablet Take 1 tablet (25 mg total) by mouth daily. 90 tablet 3  . HYDROcodone-acetaminophen (NORCO/VICODIN) 5-325 MG tablet Take 1 tablet by mouth every 6 (six) hours as needed for moderate pain or severe pain. 15 tablet 0  . loratadine (CLARITIN) 10 MG tablet Take 10 mg by mouth daily as needed for allergies.    Marland Kitchen losartan (COZAAR) 100 MG tablet Take 1 tablet (100 mg total) by mouth daily. 90 tablet 3  .  meclizine (ANTIVERT) 25 MG tablet Take 1 tablet (25 mg total) by mouth 3 (three) times daily as needed for dizziness. 30 tablet 3  . metFORMIN (GLUCOPHAGE) 1000 MG tablet TAKE 1 TABLET BY MOUTH TWO  TIMES DAILY WITH A MEAL 180 tablet 3  . ONETOUCH DELICA LANCETS 47Q MISC Check blood sugar once  daily and as directed. Dx  E11.9 100 each 3  . ONETOUCH VERIO test strip TEST BLOOD SUGAR ONCE DAILY FOR DIABETES MELLITUS AS AS NEEDED. 100 each 3  . SALINE NASAL SPRAY NA Place 1 spray into the nose daily as needed (congestion).      No current facility-administered medications on file prior to visit.      Review of Systems  Constitutional: Negative for activity change, appetite change, fatigue, fever and unexpected weight change.  HENT: Negative for congestion, ear pain, rhinorrhea, sinus pressure and sore throat.   Eyes: Negative for pain, redness and visual disturbance.  Respiratory: Negative for cough, shortness of breath and wheezing.   Cardiovascular: Negative for chest pain and palpitations.  Gastrointestinal: Positive for abdominal pain and nausea. Negative for abdominal distention, anal bleeding, blood in stool, constipation, diarrhea and vomiting.       Alt between constipation and diarrhea- years   RUQ pain   Endocrine: Negative for polydipsia and polyuria.  Genitourinary: Negative for dysuria, frequency and urgency.  Musculoskeletal: Negative for arthralgias, back pain and myalgias.  Skin: Negative for pallor and rash.  Allergic/Immunologic: Negative for environmental allergies.   Neurological: Negative for dizziness, syncope and headaches.  Hematological: Negative for adenopathy. Does not bruise/bleed easily.  Psychiatric/Behavioral: Negative for decreased concentration and dysphoric mood. The patient is not nervous/anxious.        Objective:   Physical Exam  Constitutional: She appears well-developed and well-nourished. No distress.  obese and well appearing   HENT:  Head: Normocephalic and atraumatic.  Mouth/Throat: Oropharynx is clear and moist.  Eyes: Pupils are equal, round, and reactive to light. Conjunctivae and EOM are normal. No scleral icterus.  Neck: Normal range of motion. Neck supple.  Cardiovascular: Normal rate, regular rhythm and normal heart sounds.  Pulmonary/Chest: Effort normal and breath sounds normal. No respiratory distress. She has no wheezes. She has no rales.  Abdominal: Soft. Bowel sounds are normal. She exhibits no distension, no pulsatile liver, no pulsatile midline mass and no mass. There is no hepatosplenomegaly. There is tenderness in the right upper quadrant, epigastric area and left upper quadrant. There is no rigidity, no rebound, no guarding, no CVA tenderness, no tenderness at McBurney's point and negative Murphy's sign.  Lymphadenopathy:    She has no cervical adenopathy.  Neurological: She is alert. No cranial nerve deficit. Coordination normal.  Skin: Skin is warm and dry. No erythema. No pallor.  No rash or jaundice  Psychiatric: She has a normal mood and affect.  Pleasant           Assessment & Plan:   Problem List Items Addressed This Visit      Digestive   GERD (gastroesophageal reflux disease)    With hx of peptic erosions and many strictures in the past prior to surgery (? Fundiplication)  Now more problems over the past week with heartburn/bloating/food intolerance  Inc omeprazole to bid Stop meloxicam or other nsaids   Also ruq pain episode=ordered US abd      Relevant Medications   omeprazole  (PRILOSEC) 20 MG capsule     Other   Abdominal pain, RUQ - Primary  With nausea Pain did rad to R shoulder (episode on thurs) Food intol   [place] abd ordered   If symptoms return or persist will alert [place] and go to ED      Relevant Orders   US Abdomen Complete   Dyspepsia    With hx of severe gerd/surgery and gastric erosions as well as chronic gi bleed incr omeprazole to bid  Stop meloxicam or any nsaids B[place] diet  abd [place] ordered       Relevant Orders   US Abdomen Complete

## 2018-05-25 NOTE — Assessment & Plan Note (Signed)
With hx of severe gerd/surgery and gastric erosions as well as chronic gi bleed incr omeprazole to bid  Stop meloxicam or any nsaids B[place] diet  abd [place] ordered

## 2018-05-31 ENCOUNTER — Ambulatory Visit
Admission: RE | Admit: 2018-05-31 | Discharge: 2018-05-31 | Disposition: A | Discharge status: Home / Self Care | Payer: Medicare Other | Source: Ambulatory Visit | Attending: Family Medicine | Admitting: Family Medicine

## 2018-05-31 DIAGNOSIS — R1011 Right upper quadrant pain: Secondary | ICD-10-CM | POA: Diagnosis present

## 2018-05-31 DIAGNOSIS — R1013 Epigastric pain: Secondary | ICD-10-CM | POA: Diagnosis present

## 2018-06-21 ENCOUNTER — Ambulatory Visit: Payer: Medicare Other | Admitting: Family Medicine

## 2018-06-21 ENCOUNTER — Encounter: Payer: Self-pay | Admitting: Family Medicine

## 2018-06-21 VITALS — BP 136/76 | HR 101 | Temp 98.0°F | Ht 59.5 in | Wt 158.5 lb

## 2018-06-21 DIAGNOSIS — K760 Fatty (change of) liver, not elsewhere classified: Secondary | ICD-10-CM

## 2018-06-21 DIAGNOSIS — E669 Obesity, unspecified: Secondary | ICD-10-CM | POA: Diagnosis not present

## 2018-06-21 DIAGNOSIS — K2100 Gastro-esophageal reflux disease with esophagitis, without bleeding: Secondary | ICD-10-CM

## 2018-06-21 DIAGNOSIS — I10 Essential (primary) hypertension: Secondary | ICD-10-CM | POA: Diagnosis not present

## 2018-06-21 DIAGNOSIS — D473 Essential (hemorrhagic) thrombocythemia: Secondary | ICD-10-CM

## 2018-06-21 DIAGNOSIS — R1011 Right upper quadrant pain: Secondary | ICD-10-CM

## 2018-06-21 DIAGNOSIS — R1013 Epigastric pain: Secondary | ICD-10-CM

## 2018-06-21 DIAGNOSIS — D75839 Thrombocytosis, unspecified: Secondary | ICD-10-CM

## 2018-06-21 LAB — BASIC METABOLIC PANEL
BUN: 18 mg/dL (ref 6–23)
CALCIUM: 9.2 mg/dL (ref 8.4–10.5)
CO2: 26 mEq/L (ref 19–32)
CREATININE: 0.88 mg/dL (ref 0.40–1.20)
Chloride: 99 mEq/L (ref 96–112)
GFR: 68.04 mL/min (ref >60.00)
Glucose, Bld: 111 mg/dL — ABNORMAL HIGH (ref 70–99)
Potassium: 3.9 mEq/L (ref 3.5–5.1)
SODIUM: 136 meq/L (ref 135–145)

## 2018-06-21 LAB — CBC WITH DIFFERENTIAL/PLATELET
BASOS ABS: 0.1 10*3/uL (ref 0.0–0.1)
Basophils Relative: 0.9 % (ref 0.0–3.0)
EOS PCT: 5.1 % — AB (ref 0.0–5.0)
Eosinophils Absolute: 0.3 10*3/uL (ref 0.0–0.7)
HCT: 34.4 % — ABNORMAL LOW (ref 36.0–46.0)
HEMOGLOBIN: 11.5 g/dL — AB (ref 12.0–15.0)
Lymphocytes Relative: 28 % (ref 12.0–46.0)
Lymphs Abs: 1.8 10*3/uL (ref 0.7–4.0)
MCHC: 33.4 g/dL (ref 30.0–36.0)
MCV: 86.5 fl (ref 78.0–100.0)
Monocytes Absolute: 0.5 10*3/uL (ref 0.1–1.0)
Monocytes Relative: 8.2 % (ref 3.0–12.0)
Neutro Abs: 3.7 10*3/uL (ref 1.4–7.7)
Neutrophils Relative %: 57.8 % (ref 43.0–77.0)
Platelets: 503 10*3/uL — ABNORMAL HIGH (ref 150.0–400.0)
RBC: 3.98 Mil/uL (ref 3.87–5.11)
RDW: 14.6 % (ref 11.5–15.5)
WBC: 6.4 10*3/uL (ref 4.0–10.5)

## 2018-06-21 LAB — HEPATIC FUNCTION PANEL
ALBUMIN: 4.1 g/dL (ref 3.5–5.2)
ALK PHOS: 39 U/L (ref 39–117)
ALT: 14 U/L (ref 0–35)
AST: 14 U/L (ref 0–37)
BILIRUBIN DIRECT: 0 mg/dL (ref 0.0–0.3)
TOTAL PROTEIN: 7.7 g/dL (ref 6.0–8.3)
Total Bilirubin: 0.2 mg/dL (ref 0.2–1.2)

## 2018-06-21 NOTE — Progress Notes (Signed)
Subjective:    Patient ID: Martha Hamilton, female    DOB: 07-30-1950, 67 y.o.   MRN: [id number]  HPI Last visit we addressed abd c/o/dyspepsia in setting of complex GI hx   Omeprazole was inc to bid  inst to stop meloxicam  abd [place] ordered US Abdomen Complete  Result Date: 05/31/2018 CLINICAL DATA:  New RIGHT upper quadrant pain and epigastric pain radiating to RIGHT shoulder blade, nausea and belching, history of heartburn and peptic erosions, GERD, type II diabetes mellitus, hypertension EXAM: ABDOMEN ULTRASOUND COMPLETE COMPARISON:  None FINDINGS: Gallbladder: Normally distended without stones or wall thickening. No pericholecystic fluid or sonographic Murphy sign. Common bile duct: Diameter: 5 mm diameter, normal Liver: Question subjectively enlarged. Echogenic parenchyma, likely fatty infiltration though this can be seen with cirrhosis and certain infiltrative disorders. Hepatic contours grossly smooth. Suboptimal assessment of intrahepatic detail due to sound attenuation as result of increased hepatic echogenicity. No gross hepatic mass lesion identified. Portal vein is patent on color Doppler imaging with normal direction of blood flow towards the liver. IVC: Normal appearance Pancreas: Normal appearance Spleen: Normal appearance, 8.7 cm length Right Kidney: Length: 11.8 cm. Normal morphology without mass or hydronephrosis. Left Kidney: Length: 10.4 cm. Normal cortical thickness and echogenicity. Small exophytic hypoechoic nodule at upper pole 11 x 12 x 10 mm question cyst no additional mass or hydronephrosis Abdominal aorta: Normal caliber Other findings: No free fluid IMPRESSION: Question fatty infiltration of liver and hepatic enlargement as above. No definite intrahepatic abnormalities are identified though intrahepatic assessment is limited by sound attenuation; if better intrahepatic visualization is required recommend MR or CT. Question tiny in upper pole LEFT renal cyst 12 mm greatest  size. Electronically Signed   By: Lavonia Dana M.D.   On: 05/31/2018 09:40    Fatty liver noted , no GB changes   Wt Readings from Last 3 Encounters:  06/21/18 158 lb 8 oz (71.9 kg)  05/25/18 157 lb 8 oz (71.4 kg)  05/11/18 159 lb 12 oz (72.5 kg)   31.48 kg/m   Lab Results  Component Value Date   ALT 18 01/19/2018   AST 17 01/19/2018   ALKPHOS 49 12/21/2017   BILITOT 0.3 12/21/2017    She still has a little problem before  breakfast (soft boiled egg and toast)- then she feels better  Avoids greasy and spicy foods  Able to eat oranges-no problems  No side effects from inc in omeprazole at all   Only had pain in RUQ one time  That is much improved   No trouble swallowing at all   Brother had fatty liver also   Liver tests not elevated   Diet:- has cut back on fats  Exercise : - doing knee exercises and some stretching 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 Fatty liver 06/21/2018  . Obesity (BMI 30-39.9) 06/21/2018  . Dyspepsia 05/25/2018  . GERD (gastroesophageal reflux disease) 05/25/2018  . Abdominal pain, RUQ 05/25/2018  . H/O vertigo 05/11/2018  . Pre-op examination 01/22/2018  . Thrombocytosis ([place]) 12/25/2017  . Left knee pain 09/21/2017  . Low back pain 07/06/2017  . Welcome to Medicare preventive visit 12/17/2016  . Grief reaction 12/17/2016  . Estrogen deficiency 09/08/2016  . Muscle pain 02/08/2016  .  Anemia 02/09/2013  . Abnormal EKG 07/27/2012  . Special screening for malignant neoplasms, colon 04/29/2011  . Gynecological examination 04/29/2011  . Routine general medical examination at a health care facility 04/20/2011  . OSTEOARTHRITIS, HANDS, BILATERAL 10/31/2008  . Diabetes type 2, controlled ([place]) 07/10/2008  . Hyperlipidemia 01/26/2008  . Essential hypertension  01/26/2008  . ALLERGIC RHINITIS 12/07/2007   Past Medical History:  Diagnosis Date  . Allergic rhinitis   . Breast cancer ([place]) 1996   right breast  . DM2 (diabetes mellitus, type 2) ([place])   . GERD (gastroesophageal reflux disease)   . Hx of cardiovascular stress test    a. Lex MV 2/14:  EF 69%, no ischemia  . Hyperlipidemia   . Hypertension   . Iron deficiency anemia   . Obesity   . Osteoarthritis    hands  . Ulcer    Past Surgical History:  Procedure Laterality Date  . APPENDECTOMY  1976  . BREAST LUMPECTOMY Right 1996   right breast, lumpectomy w/nodes  . CHONDROPLASTY Left 02/10/2018   Procedure: CHONDROPLASTY;  Surgeon: Lovell Sheehan, MD;  Location: [facility];  Service: Orthopedics;  Laterality: Left;  Marland Kitchen GASTRIC RESTRICTION SURGERY     for reflux  . HYSTEROSCOPY W/D&C N/A 10/18/2012   Procedure: DILATATION AND CURETTAGE /HYSTEROSCOPY;  Surgeon: Mora Bellman, MD;  Location: [facility];  Service: Gynecology;  Laterality: N/A;  . KNEE ARTHROSCOPY WITH MEDIAL MENISECTOMY Left 02/10/2018   Procedure: KNEE ARTHROSCOPY WITH MEDIAL and LATERAL MENISECTOMY;  Surgeon: Lovell Sheehan, MD;  Location: [facility];  Service: Orthopedics;  Laterality: Left;  . POLYPECTOMY N/A 10/18/2012   Procedure: POLYPECTOMY;  Surgeon: Mora Bellman, MD;  Location: [facility];  Service: Gynecology;  Laterality: N/A;  . SYNOVECTOMY Left 02/10/2018   Procedure: SYNOVECTOMY;  Surgeon: Lovell Sheehan, MD;  Location: [facility];  Service: Orthopedics;  Laterality: Left;   Social History   Tobacco Use  . Smoking status: Never Smoker  . Smokeless tobacco: Never Used  Substance Use Topics  . Alcohol use: Never    Alcohol/week: 0.0 standard drinks    Frequency: Never  . Drug use: No   Family History  Problem Relation Age of Onset  . Diabetes Mother   . Heart disease Mother        Pacemaker, CHF  . Hypertension Mother   . Kidney cancer Mother   . Stroke Father   . CAD Father 78       Died with MI  .  Esophageal cancer Brother   . Heart disease Sister   . Diabetes Sister   . Colon cancer Neg Hx   . Rectal cancer Neg Hx   . Stomach cancer Neg Hx    Allergies  Allergen Reactions  . Cholestyramine Other (See Comments)    REACTION: Muscles tightened up, drew up.  . Lipitor [Atorvastatin] Other (See Comments)    Muscle cramping  . Niacin Nausea And Vomiting  . Simvastatin     Muscular pain   Current Outpatient Medications on File Prior to Visit  Medication Sig Dispense Refill  . acetaminophen (TYLENOL) 500 MG tablet Take 1,000 mg by mouth every 6 (six) hours as needed for mild pain.     Marland Kitchen albuterol (PROVENTIL HFA;VENTOLIN HFA) 108 (90 Base) MCG/ACT inhaler Inhale 2 puffs into the lungs every 4 (four) hours as needed for wheezing. 1 Inhaler 0  . amitriptyline (ELAVIL) 25 MG tablet Take 1 tablet (25 mg total) by mouth at bedtime. [place]  tablet 3  . amLODipine (NORVASC) 5 MG tablet Take 1 tablet (5 mg total) by mouth daily. 30 tablet 3  . aspirin 81 MG tablet Take 81 mg by mouth at bedtime.     . calcium elemental as carbonate (TUMS ULTRA 1000) 400 MG chewable tablet Chew 1,000 mg by mouth daily as needed for heartburn.    . Cholecalciferol (VITAMIN D) 2000 units tablet Take 2,000 Units by mouth daily.    Marland Kitchen docusate sodium (COLACE) 100 MG capsule Take 1 capsule (100 mg total) by mouth daily as needed. 30 capsule 2  . fenofibrate 54 MG tablet TAKE 1 TABLET(54 MG) BY MOUTH DAILY 30 tablet 6  . ferrous sulfate 325 (65 FE) MG tablet Take 1 tablet (325 mg total) by mouth 2 (two) times daily with a meal. 180 tablet 3  . glipiZIDE (GLUCOTROL XL) 2.5 MG 24 hr tablet Take 1 tablet (2.5 mg total) by mouth every evening. 90 tablet 3  . hydrochlorothiazide (HYDRODIURIL) 25 MG tablet Take 1 tablet (25 mg total) by mouth daily. 90 tablet 3  . HYDROcodone-acetaminophen (NORCO/VICODIN) 5-325 MG tablet Take 1 tablet by mouth every 6 (six) hours as needed for moderate pain or severe pain. 15 tablet 0  .  loratadine (CLARITIN) 10 MG tablet Take 10 mg by mouth daily as needed for allergies.    Marland Kitchen losartan (COZAAR) 100 MG tablet Take 1 tablet (100 mg total) by mouth daily. 90 tablet 3  . meclizine (ANTIVERT) 25 MG tablet Take 1 tablet (25 mg total) by mouth 3 (three) times daily as needed for dizziness. 30 tablet 3  . metFORMIN (GLUCOPHAGE) 1000 MG tablet TAKE 1 TABLET BY MOUTH TWO  TIMES DAILY WITH A MEAL 180 tablet 3  . omeprazole (PRILOSEC) 20 MG capsule Take 1 capsule (20 mg total) by mouth 2 (two) times daily before a meal. 180 capsule 3  . ONETOUCH DELICA LANCETS 30Z MISC Check blood sugar once  daily and as directed. Dx  E11.9 100 each 3  . ONETOUCH VERIO test strip TEST BLOOD SUGAR ONCE DAILY FOR DIABETES MELLITUS AS AS NEEDED. 100 each 3  . SALINE NASAL SPRAY NA Place 1 spray into the nose daily as needed (congestion).      No current facility-administered medications on file prior to visit.      Review of Systems  Constitutional: Negative for activity change, appetite change, fatigue, fever and unexpected weight change.  HENT: Negative for congestion, ear pain, rhinorrhea, sinus pressure and sore throat.   Eyes: Negative for pain, redness and visual disturbance.  Respiratory: Negative for cough, shortness of breath and wheezing.   Cardiovascular: Negative for chest pain and palpitations.  Gastrointestinal: Negative for abdominal pain, blood in stool, constipation and diarrhea.       Dyspepsia is much improved   Endocrine: Negative for polydipsia and polyuria.  Genitourinary: Negative for dysuria, frequency and urgency.  Musculoskeletal: Negative for arthralgias, back pain and myalgias.  Skin: Negative for pallor and rash.  Allergic/Immunologic: Negative for environmental allergies.  Neurological: Negative for dizziness, syncope and headaches.  Hematological: Negative for adenopathy. Does not bruise/bleed easily.  Psychiatric/Behavioral: Negative for decreased concentration and  dysphoric mood. The patient is not nervous/anxious.        Objective:   Physical Exam  Constitutional: She appears well-developed and well-nourished. No distress.  obese and well appearing   HENT:  Head: Normocephalic and atraumatic.  Mouth/Throat: Oropharynx is clear and moist.  Eyes: Pupils are equal, round, and  reactive to light. Conjunctivae and EOM are normal.  Neck: Normal range of motion. Neck supple. No JVD present. Carotid bruit is not present. No thyromegaly present.  Cardiovascular: Normal rate, regular rhythm, normal heart sounds and intact distal pulses. Exam reveals no gallop.  Pulmonary/Chest: Effort normal and breath sounds normal. No respiratory distress. She has no wheezes. She has no rales.  No crackles  Abdominal: Soft. Bowel sounds are normal. She exhibits no shifting dullness, no distension, no pulsatile liver, no abdominal bruit, no pulsatile midline mass and no mass. There is no hepatosplenomegaly. There is no tenderness.  Musculoskeletal: She exhibits no edema.  Lymphadenopathy:    She has no cervical adenopathy.  Neurological: She is alert. She has normal reflexes. She displays normal reflexes. No cranial nerve deficit. Coordination normal.  Skin: Skin is warm and dry. No rash noted. No pallor.  Psychiatric: She has a normal mood and affect.  Pleasant           Assessment & Plan:   Problem List Items Addressed This Visit      Cardiovascular and Mediastinum   Essential hypertension    bp in fair control at this time  BP Readings from Last 1 Encounters:  06/21/18 136/76   No changes needed Most recent labs reviewed  Disc lifstyle change with low sodium diet and exercise        Relevant Orders   CBC with Differential/Platelet (Completed)   Basic metabolic panel (Completed)     Digestive   GERD (gastroesophageal reflux disease)    Now on ppi bid - much improved Disc optimal diet  Wt loss should also help      Relevant Orders   CBC with  Differential/Platelet (Completed)   Fatty liver - Primary    Seen on [place] (enl with app of fatty liver) No pain or elevation of LFTs Labs today for hepatic fxn and cbc  Will continue to follow Made a plan for weight loss to help this          Hematopoietic and Hemostatic   Thrombocytosis ([place])    Likely due to iron def Lab today  No excess bleeding or bruising         Other   Obesity (BMI 30-39.9)    Discussed how this problem influences overall health and the risks it imposes  Reviewed plan for weight loss with lower calorie diet (via better food choices and also portion control or program like weight watchers) and exercise building up to or more than 30 minutes 5 days per week including some aerobic activity   Disc imp of wt loss with fatty liver  Plan made       Dyspepsia    Much improved with bid ppi Will continue that       RESOLVED: Abdominal pain, RUQ    Now resolved with inc to bid ppi  No gb changes on [place]  Fatty liver was noted       Relevant Orders   CBC with Differential/Platelet (Completed)   Hepatic function panel (Completed)   Basic metabolic panel (Completed)

## 2018-06-21 NOTE — Assessment & Plan Note (Signed)
bp in fair control at this time  BP Readings from Last 1 Encounters:  06/21/18 136/76   No changes needed Most recent labs reviewed  Disc lifstyle change with low sodium diet and exercise

## 2018-06-21 NOTE — Patient Instructions (Addendum)
You have what looks like a fatty liver  Weight loss is most important  We need to add 30 minutes a day of "cardio" - something to get heart rate up   Continue to cut back on fats Try to get most of your carbohydrates from produce (with the exception of white potatoes)  Eat less bread/pasta/rice/snack foods/cereals/sweets and other items from the middle of the grocery store (processed carbs)   Fruits and vegetables and lean protein is the way to go  Counting calories-you would probably need about 1200 calories for weight loss  Consider joining weight watchers - it is a very successful program   Labs today for cbc (blood count) and also liver test   Continue the omeprazole twice daily

## 2018-06-21 NOTE — Assessment & Plan Note (Signed)
Discussed how this problem influences overall health and the risks it imposes  Reviewed plan for weight loss with lower calorie diet (via better food choices and also portion control or program like weight watchers) and exercise building up to or more than 30 minutes 5 days per week including some aerobic activity   Disc imp of wt loss with fatty liver  Plan made

## 2018-06-21 NOTE — Assessment & Plan Note (Signed)
Much improved with bid ppi Will continue that

## 2018-06-21 NOTE — Assessment & Plan Note (Signed)
Likely due to iron def Lab today  No excess bleeding or bruising

## 2018-06-21 NOTE — Assessment & Plan Note (Signed)
Now on ppi bid - much improved Disc optimal diet  Wt loss should also help

## 2018-06-21 NOTE — Assessment & Plan Note (Signed)
Now resolved with inc to bid ppi  No gb changes on [place]  Fatty liver was noted

## 2018-06-21 NOTE — Assessment & Plan Note (Signed)
Seen on [place] (enl with app of fatty liver) No pain or elevation of LFTs Labs today for hepatic fxn and cbc  Will continue to follow Made a plan for weight loss to help this

## 2018-06-23 ENCOUNTER — Other Ambulatory Visit: Payer: Self-pay | Admitting: Family Medicine

## 2018-11-01 ENCOUNTER — Encounter: Payer: Self-pay | Admitting: Family Medicine

## 2018-11-01 ENCOUNTER — Ambulatory Visit (INDEPENDENT_AMBULATORY_CARE_PROVIDER_SITE_OTHER): Payer: Medicare Other | Admitting: Family Medicine

## 2018-11-01 DIAGNOSIS — E119 Type 2 diabetes mellitus without complications: Secondary | ICD-10-CM

## 2018-11-01 DIAGNOSIS — I10 Essential (primary) hypertension: Secondary | ICD-10-CM | POA: Diagnosis not present

## 2018-11-01 DIAGNOSIS — D473 Essential (hemorrhagic) thrombocythemia: Secondary | ICD-10-CM

## 2018-11-01 DIAGNOSIS — D649 Anemia, unspecified: Secondary | ICD-10-CM

## 2018-11-01 DIAGNOSIS — D75839 Thrombocytosis, unspecified: Secondary | ICD-10-CM

## 2018-11-01 NOTE — Progress Notes (Signed)
Virtual Visit via Video Note  I connected with Martha Hamilton on 11/01/18 at  9:30 AM EDT by a video enabled telemedicine application and verified that I am speaking with the correct person using two identifiers. The patient is at home today  I am in my office at [place]  Attempt at video connection was attempted and not successful today so encounter was done over the phone    I discussed the limitations of evaluation and management by telemedicine and the availability of in person appointments. The patient expressed understanding and agreed to proceed.  History of Present Illness: Here for concerns about BP  Her bp has been running low at home (started on Thursday April 9th)  She feels washed out  She is dizzy when she changes position (getting over)  If she bends over and stand she gets dizzy /worried she will fall)   At home her bp has been ok in am before she takes any pills  Getting as low as 88/46 Her pulse rate was 100 yesterday -when moving around  She uses a wrist cuff-it has been very accurate in the past/has been tested by a friend with a regular cuff   She does not tolerate breakfast /small meals at night  Her weight has come down just a little   No ankles swelling   No cp or sob    BP Readings from Last 3 Encounters:  06/21/18 136/76  05/25/18 128/74  05/11/18 136/82   She takes amlodipine 5 mg HCTZ 25 mg  Losartan 100 mg   She makes an effort to drink fluids as well/could do better   Is lonely - but getting by and not depressed   Lab Results  Component Value Date   CREATININE 0.88 06/21/2018   BUN 18 06/21/2018   NA 136 06/21/2018   K 3.9 06/21/2018   CL 99 06/21/2018   CO2 26 06/21/2018   Lab Results  Component Value Date   ALT 14 06/21/2018   AST 14 06/21/2018   ALKPHOS 39 06/21/2018   BILITOT 0.2 06/21/2018   Lab Results  Component Value Date   WBC 6.4 06/21/2018   HGB 11.5 (L) 06/21/2018   HCT 34.4 (L) 06/21/2018   MCV 86.5  06/21/2018   PLT 503.0 (H) 06/21/2018   H/o anemia and chronic GI bleed Taking oral iron bid - no missed doses  Bleeding/bruising  No more pale than usual    Lab Results  Component Value Date   TSH 1.05 12/21/2017    Review of Systems  Constitutional: Positive for malaise/fatigue and weight loss. Negative for chills, diaphoresis and fever.  HENT: Negative for sore throat.   Eyes: Negative for blurred vision.  Respiratory: Negative for cough, shortness of breath and wheezing.   Cardiovascular: Negative for chest pain, palpitations, orthopnea, leg swelling and PND.  Gastrointestinal: Negative for abdominal pain, heartburn and vomiting.       Poor appetite   Genitourinary: Negative for dysuria.  Musculoskeletal: Negative for myalgias.  Skin: Negative for itching and rash.  Neurological: Positive for dizziness. Negative for focal weakness and headaches.  Psychiatric/Behavioral: Negative for depression and memory loss. The patient is not nervous/anxious.       Patient Active Problem List   Diagnosis Date Noted  . Fatty liver 06/21/2018  . Obesity (BMI 30-39.9) 06/21/2018  . Dyspepsia 05/25/2018  . GERD (gastroesophageal reflux disease) 05/25/2018  . H/O vertigo 05/11/2018  . Pre-op examination 01/22/2018  . Thrombocytosis ([place])  12/25/2017  . Left knee pain 09/21/2017  . Low back pain 07/06/2017  . Welcome to Medicare preventive visit 12/17/2016  . Grief reaction 12/17/2016  . Estrogen deficiency 09/08/2016  . Muscle pain 02/08/2016  . Anemia 02/09/2013  . Abnormal EKG 07/27/2012  . Special screening for malignant neoplasms, colon 04/29/2011  . Gynecological examination 04/29/2011  . Routine general medical examination at a health care facility 04/20/2011  . OSTEOARTHRITIS, HANDS, BILATERAL 10/31/2008  . Diabetes type 2, controlled ([place]) 07/10/2008  . Hyperlipidemia 01/26/2008  . Essential hypertension 01/26/2008  . ALLERGIC RHINITIS 12/07/2007   Past Medical History:   Diagnosis Date  . Allergic rhinitis   . Breast cancer ([place]) 1996   right breast  . DM2 (diabetes mellitus, type 2) ([place])   . GERD (gastroesophageal reflux disease)   . Hx of cardiovascular stress test    a. Lex MV 2/14:  EF 69%, no ischemia  . Hyperlipidemia   . Hypertension   . Iron deficiency anemia   . Obesity   . Osteoarthritis    hands  . Ulcer    Past Surgical History:  Procedure Laterality Date  . APPENDECTOMY  1976  . BREAST LUMPECTOMY Right 1996   right breast, lumpectomy w/nodes  . CHONDROPLASTY Left 02/10/2018   Procedure: CHONDROPLASTY;  Surgeon: Lovell Sheehan, MD;  Location: [facility];  Service: Orthopedics;  Laterality: Left;  Marland Kitchen GASTRIC RESTRICTION SURGERY     for reflux  . HYSTEROSCOPY W/D&C N/A 10/18/2012   Procedure: DILATATION AND CURETTAGE /HYSTEROSCOPY;  Surgeon: Mora Bellman, MD;  Location: [facility];  Service: Gynecology;  Laterality: N/A;  . KNEE ARTHROSCOPY WITH MEDIAL MENISECTOMY Left 02/10/2018   Procedure: KNEE ARTHROSCOPY WITH MEDIAL and LATERAL MENISECTOMY;  Surgeon: Lovell Sheehan, MD;  Location: [facility];  Service: Orthopedics;  Laterality: Left;  . POLYPECTOMY N/A 10/18/2012   Procedure: POLYPECTOMY;  Surgeon: Mora Bellman, MD;  Location: [facility];  Service: Gynecology;  Laterality: N/A;  . SYNOVECTOMY Left 02/10/2018   Procedure: SYNOVECTOMY;  Surgeon: Lovell Sheehan, MD;  Location: [facility];  Service: Orthopedics;  Laterality: Left;   Social History   Tobacco Use  . Smoking status: Never Smoker  . Smokeless tobacco: Never Used  Substance Use Topics  . Alcohol use: Never    Alcohol/week: 0.0 standard drinks    Frequency: Never  . Drug use: No   Family History  Problem Relation Age of Onset  . Diabetes Mother   . Heart disease Mother        Pacemaker, CHF  . Hypertension Mother   . Kidney cancer Mother   . Stroke Father   . CAD Father 83       Died with MI  . Esophageal cancer Brother   . Heart disease Sister   . Diabetes Sister    . Colon cancer Neg Hx   . Rectal cancer Neg Hx   . Stomach cancer Neg Hx    Allergies  Allergen Reactions  . Cholestyramine Other (See Comments)    REACTION: Muscles tightened up, drew up.  . Lipitor [Atorvastatin] Other (See Comments)    Muscle cramping  . Niacin Nausea And Vomiting  . Simvastatin     Muscular pain   Current Outpatient Medications on File Prior to Visit  Medication Sig Dispense Refill  . acetaminophen (TYLENOL) 500 MG tablet Take 1,000 mg by mouth every 6 (six) hours as needed for mild pain.     Marland Kitchen albuterol (PROVENTIL HFA;VENTOLIN HFA) 108 (90  Base) MCG/ACT inhaler Inhale 2 puffs into the lungs every 4 (four) hours as needed for wheezing. 1 Inhaler 0  . amitriptyline (ELAVIL) 25 MG tablet Take 1 tablet (25 mg total) by mouth at bedtime. 90 tablet 3  . amLODipine (NORVASC) 5 MG tablet TAKE 1 TABLET(5 MG) BY MOUTH DAILY 90 tablet 1  . aspirin 81 MG tablet Take 81 mg by mouth at bedtime.     . calcium elemental as carbonate (TUMS ULTRA 1000) 400 MG chewable tablet Chew 1,000 mg by mouth daily as needed for heartburn.    . Cholecalciferol (VITAMIN D) 2000 units tablet Take 2,000 Units by mouth daily.    Marland Kitchen docusate sodium (COLACE) 100 MG capsule Take 1 capsule (100 mg total) by mouth daily as needed. 30 capsule 2  . fenofibrate 54 MG tablet TAKE 1 TABLET(54 MG) BY MOUTH DAILY 30 tablet 6  . ferrous sulfate 325 (65 FE) MG tablet Take 1 tablet (325 mg total) by mouth 2 (two) times daily with a meal. 180 tablet 3  . glipiZIDE (GLUCOTROL XL) 2.5 MG 24 hr tablet Take 1 tablet (2.5 mg total) by mouth every evening. 90 tablet 3  . hydrochlorothiazide (HYDRODIURIL) 25 MG tablet Take 1 tablet (25 mg total) by mouth daily. 90 tablet 3  . HYDROcodone-acetaminophen (NORCO/VICODIN) 5-325 MG tablet Take 1 tablet by mouth every 6 (six) hours as needed for moderate pain or severe pain. 15 tablet 0  . loratadine (CLARITIN) 10 MG tablet Take 10 mg by mouth daily as needed for allergies.     Marland Kitchen losartan (COZAAR) 100 MG tablet Take 1 tablet (100 mg total) by mouth daily. 90 tablet 3  . meclizine (ANTIVERT) 25 MG tablet Take 1 tablet (25 mg total) by mouth 3 (three) times daily as needed for dizziness. 30 tablet 3  . metFORMIN (GLUCOPHAGE) 1000 MG tablet TAKE 1 TABLET BY MOUTH TWO  TIMES DAILY WITH A MEAL 180 tablet 3  . omeprazole (PRILOSEC) 20 MG capsule Take 1 capsule (20 mg total) by mouth 2 (two) times daily before a meal. 180 capsule 3  . ONETOUCH DELICA LANCETS 02R MISC Check blood sugar once  daily and as directed. Dx  E11.9 100 each 3  . ONETOUCH VERIO test strip TEST BLOOD SUGAR ONCE DAILY FOR DIABETES MELLITUS AS AS NEEDED. 100 each 3  . SALINE NASAL SPRAY NA Place 1 spray into the nose daily as needed (congestion).      No current facility-administered medications on file prior to visit.     Observations/Objective: Pt sounds like her usual self  No change in voice No slurred speech or hoarseness  Cheerful and talkative Gives a good history   Assessment and Plan: Problem List Items Addressed This Visit      Cardiovascular and Mediastinum   Essential hypertension - Primary    Pt has noted low bp with fatigue and positional dizziness since 4/9 No reason for it except eating less/less appetite and ? A little wt loss Per pt no problems with blood sugar and not more pale She has not missed iron doses  Rev last labs Will have her hold the amlodipine 5 mg this week and then call us back with some readings and report how she feels  If bp very high will call earlier  If no imp or still tired would check her cbc/labs  She voiced understanding  Enc regular meals with protein even if low appetite        Endocrine  Diabetes type 2, controlled ([place])    Lab Results  Component Value Date   HGBA1C 5.5 05/11/2018   Eating less/ less appetite lately        Hematopoietic and Hemostatic   Thrombocytosis ([place])    Thought to be reactive from iron def  Last plt ct 503  in december        Other   Anemia    Chronic- in the recent past stabilized with oral iron bid  She has been tired and bp down lately -but also eating less She has not noted change in skin tone or bleeding or GI symptoms If no imp with d/c of amlodipine we will want to check labs She will update [place] in a week          Follow Up Instructions: Hold amlodipine for now  Call and update [place] in a week re: how you feel and how bp runs  If any new symptoms like bleeding or abd pain please alert [place]  Continue twice daily iron  Try to eat regular meals with protein Rennie Natter if not hungry (snacks)    I discussed the assessment and treatment plan with the patient. The patient was provided an opportunity to ask questions and all were answered. The patient agreed with the plan and demonstrated an understanding of the instructions.   The patient was advised to call back or seek an in-person evaluation if the symptoms worsen or if the condition fails to improve as anticipated.     Loura Pardon, MD

## 2018-11-01 NOTE — Assessment & Plan Note (Signed)
Thought to be reactive from iron def  Last plt ct 503 in december

## 2018-11-01 NOTE — Assessment & Plan Note (Addendum)
Chronic- in the recent past stabilized with oral iron bid  She has been tired and bp down lately -but also eating less She has not noted change in skin tone or bleeding or GI symptoms If no imp with d/c of amlodipine we will want to check labs She will update [place] in a week

## 2018-11-01 NOTE — Assessment & Plan Note (Signed)
Pt has noted low bp with fatigue and positional dizziness since 4/9 No reason for it except eating less/less appetite and ? A little wt loss Per pt no problems with blood sugar and not more pale She has not missed iron doses  Rev last labs Will have her hold the amlodipine 5 mg this week and then call us back with some readings and report how she feels  If bp very high will call earlier  If no imp or still tired would check her cbc/labs  She voiced understanding  Enc regular meals with protein even if low appetite

## 2018-11-01 NOTE — Assessment & Plan Note (Addendum)
Lab Results  Component Value Date   HGBA1C 5.5 05/11/2018   Eating less/ less appetite lately

## 2018-11-09 ENCOUNTER — Telehealth: Payer: Self-pay | Admitting: Family Medicine

## 2018-11-09 NOTE — Telephone Encounter (Signed)
Best number [phone] Pt called with bp readings  Pt stated bp still getting low in afternoon She feels the difference when the bottom number gets in the 50.  She has not strength and tired   4/20 @ 8am 128/69 before meds             10:45  123/67              11:25   98/57               12:00  106/56                 2:10   107/59                 3:40    117/52                  5:50   110/60                  9:45    125/67  4/21     7:55am   146/83              9:35        121/71              11:13       114/58               1:40        102/54                4:26        127/69                 8:00       116/67                 9:15        112/57  4/22         8:am        128/75               11:08         115/68               12:40          111/63                 2:50          126/47                  4:30          117/66                  8:37          136/76  4/23          7:34am   148/75                 10:35        115/64                  2:15           111/64                   4:48            116/63                    7:40  118/68                    10:45         120/57  4/24            7:40am     129/74                     9:47           116/67                     2:36            106/42                     6:25             118/62                      4/25             7:05am      133/74                      9:45            108/56          11:40           126/67            4:35           120/68                      7:32            122/67                     10:30            113/61  4/26             7:57am        133/73                     1:00              126/68                      4:20             108/56                      7:10             114/65                      10:50            117/64  4/27              8:am            134/80                      11:10           115/71                      12:25            112/62   1:25  110/59 °                       4:55           108/55 °                       7:40            115/69 °                      10:05           116/56 ° °4/28                   7:45         124/71 °    ° °  °

## 2018-11-09 NOTE — Telephone Encounter (Signed)
Spoke to pt. She will take 1/2 a 100mg  Losartan and will update [place] next week.

## 2018-11-09 NOTE — Telephone Encounter (Signed)
Please tell her to cut her losartan in 1/2 (she has the 100 mg pills I think) Just take 1/2 pill daily instead of a whole  Update [place] in a week or two with how bp readings are and how her symptoms are

## 2018-11-14 ENCOUNTER — Other Ambulatory Visit: Payer: Self-pay | Admitting: Family Medicine

## 2018-11-23 NOTE — Telephone Encounter (Signed)
Pt called back with readings after reduced losartan. Pt said she feels better and is not as lightheaded, feels stronger and more like working.  4/29- 122/63 7:50am, 132/64 @ 9:20 am, 124/72 @ 10:30 am, 112/58 @12 :35pm, 114/61 @ 3pm, 122/59 @ 9:47pm  4/30- 136/72 @ 7:15 am, 120/56 @2 :35pm, 125/68 @9 :50 pm  5/1-126/67 @ 7am, 115/58 @9 :15pm  5/2- 131/72 @7 :45 am, 105/55 @ 4:20 pm, 122/64 @ 10:55pm  5/3- 135/78 @ 7:45am, 118/67 @ 10:10pm  5/4- 117/81 @ 8am, 116/59 @ 9:30 pm  5/5- 119/64 @ 8am, 123/71 @ 9:40pm  5/6- 145/82 @ 7:32 am, 133/71 @ 9:30 pm  5/7- 138/83 @ 7am, 121/66 @ 10pm  5/8- 130/80 @ 6:30 am, 128/68 @ 9:30pm  5/9- 132/83 @ 8am, 120/70 @ 9:50 pm  5/10- 143/85 @ 8:05am, 126/68 @ 9:15pm  5/11- 135/83 @ 7:30 am, 110/61 @ 10:30 pm  5/12- 125/71 @ 8am,

## 2018-11-23 NOTE — Telephone Encounter (Signed)
Good-please stick with that dose (50 mg of losartan instead of 100)  Keep [place] posted if any changes

## 2018-11-23 NOTE — Telephone Encounter (Signed)
Pt notified of Dr. Tower's comments and verbalized understanding  

## 2018-12-01 ENCOUNTER — Other Ambulatory Visit: Payer: Self-pay | Admitting: Family Medicine

## 2018-12-21 ENCOUNTER — Other Ambulatory Visit: Payer: Self-pay | Admitting: Family Medicine

## 2019-01-04 ENCOUNTER — Other Ambulatory Visit: Payer: Self-pay | Admitting: Family Medicine

## 2019-01-09 ENCOUNTER — Telehealth: Payer: Self-pay | Admitting: Family Medicine

## 2019-01-09 DIAGNOSIS — D75839 Thrombocytosis, unspecified: Secondary | ICD-10-CM

## 2019-01-09 DIAGNOSIS — E782 Mixed hyperlipidemia: Secondary | ICD-10-CM

## 2019-01-09 DIAGNOSIS — E119 Type 2 diabetes mellitus without complications: Secondary | ICD-10-CM

## 2019-01-09 DIAGNOSIS — Z0000 Encounter for general adult medical examination without abnormal findings: Secondary | ICD-10-CM

## 2019-01-09 DIAGNOSIS — D473 Essential (hemorrhagic) thrombocythemia: Secondary | ICD-10-CM

## 2019-01-09 DIAGNOSIS — I10 Essential (primary) hypertension: Secondary | ICD-10-CM

## 2019-01-09 NOTE — Telephone Encounter (Signed)
-----   Message from Ellamae Sia sent at 01/05/2019  3:28 PM EDT ----- Regarding: Lab orders for monday6.29.20 Patient is scheduled for CPX labs, please order future labs, Thanks , Karna Christmas

## 2019-01-10 ENCOUNTER — Other Ambulatory Visit (INDEPENDENT_AMBULATORY_CARE_PROVIDER_SITE_OTHER): Payer: Medicare Other

## 2019-01-10 ENCOUNTER — Other Ambulatory Visit: Payer: Self-pay

## 2019-01-10 ENCOUNTER — Ambulatory Visit (INDEPENDENT_AMBULATORY_CARE_PROVIDER_SITE_OTHER): Payer: Medicare Other

## 2019-01-10 DIAGNOSIS — D473 Essential (hemorrhagic) thrombocythemia: Secondary | ICD-10-CM | POA: Diagnosis not present

## 2019-01-10 DIAGNOSIS — I10 Essential (primary) hypertension: Secondary | ICD-10-CM | POA: Diagnosis not present

## 2019-01-10 DIAGNOSIS — E119 Type 2 diabetes mellitus without complications: Secondary | ICD-10-CM | POA: Diagnosis not present

## 2019-01-10 DIAGNOSIS — Z0000 Encounter for general adult medical examination without abnormal findings: Secondary | ICD-10-CM

## 2019-01-10 DIAGNOSIS — D75839 Thrombocytosis, unspecified: Secondary | ICD-10-CM

## 2019-01-10 DIAGNOSIS — E782 Mixed hyperlipidemia: Secondary | ICD-10-CM | POA: Diagnosis not present

## 2019-01-10 LAB — CBC WITH DIFFERENTIAL/PLATELET
Basophils Absolute: 0.1 10*3/uL (ref 0.0–0.1)
Basophils Relative: 0.9 % (ref 0.0–3.0)
Eosinophils Absolute: 0.2 10*3/uL (ref 0.0–0.7)
Eosinophils Relative: 3.9 % (ref 0.0–5.0)
HCT: 32.8 % — ABNORMAL LOW (ref 36.0–46.0)
Hemoglobin: 10.7 g/dL — ABNORMAL LOW (ref 12.0–15.0)
Lymphocytes Relative: 31.2 % (ref 12.0–46.0)
Lymphs Abs: 1.9 10*3/uL (ref 0.7–4.0)
MCHC: 32.7 g/dL (ref 30.0–36.0)
MCV: 87.1 fl (ref 78.0–100.0)
Monocytes Absolute: 0.4 10*3/uL (ref 0.1–1.0)
Monocytes Relative: 7.2 % (ref 3.0–12.0)
Neutro Abs: 3.4 10*3/uL (ref 1.4–7.7)
Neutrophils Relative %: 56.8 % (ref 43.0–77.0)
Platelets: 515 10*3/uL — ABNORMAL HIGH (ref 150.0–400.0)
RBC: 3.77 Mil/uL — ABNORMAL LOW (ref 3.87–5.11)
RDW: 15.8 % — ABNORMAL HIGH (ref 11.5–15.5)
WBC: 6 10*3/uL (ref 4.0–10.5)

## 2019-01-10 LAB — TSH: TSH: 1.04 u[IU]/mL (ref 0.35–4.50)

## 2019-01-10 LAB — HEMOGLOBIN A1C: Hgb A1c MFr Bld: 6.6 % — ABNORMAL HIGH (ref 4.6–6.5)

## 2019-01-10 NOTE — Progress Notes (Signed)
PCP notes:   Health maintenance:  A1C - completed  Abnormal screenings:   None  Patient concerns:   None  Nurse concerns:  None  Next PCP appt:   01/11/19 @ 0830  I reviewed health advisor's note, was available for consultation, and agree with documentation and plan. Loura Pardon MD

## 2019-01-10 NOTE — Progress Notes (Signed)
Subjective:   Martha Hamilton is a 68 y.o. female who presents for Medicare Annual (Subsequent) preventive examination.  Review of Systems:  N/A Cardiac Risk Factors include: advanced age (>11men, >70 women)     Objective:     Vitals: LMP 06/13/2001   There is no height or weight on file to calculate BMI.  Advanced Directives 01/10/2019 02/10/2018 02/10/2018 02/02/2018 12/21/2017 06/24/2016 10/12/2012  Does Patient Have a Medical Advance Directive? Yes - Yes Yes Yes Yes Patient has advance directive, copy in chart  Type of Advance Directive Living will - Living will Living will [place];Living will Living will -  Does patient want to make changes to medical advance directive? - No - Patient declined - - - - -  Copy of [place] in Chart? - - - - No - copy requested - -    Tobacco Social History   Tobacco Use  Smoking Status Never Smoker  Smokeless Tobacco Never Used     Counseling given: No   Clinical Intake:  Pre-visit preparation completed: Yes  Pain : No/denies pain Pain Score: 0-No pain     Nutritional Status: BMI 25 -29 Overweight Nutritional Risks: None  How often do you need to have someone help you when you read instructions, pamphlets, or other written materials from your doctor or pharmacy?: 1 - Never What is the last grade level you completed in school?: 12th grade  Interpreter Needed?: No  Comments: pt lives independently Information entered by :: LPinson, RN  Past Medical History:  Diagnosis Date  . Allergic rhinitis   . Breast cancer ([place]) 1996   right breast  . DM2 (diabetes mellitus, type 2) ([place])   . GERD (gastroesophageal reflux disease)   . Hx of cardiovascular stress test    a. Lex MV 2/14:  EF 69%, no ischemia  . Hyperlipidemia   . Hypertension   . Iron deficiency anemia   . Obesity   . Osteoarthritis    hands  . Ulcer    Past Surgical History:  Procedure Laterality Date  . APPENDECTOMY   1976  . BREAST LUMPECTOMY Right 1996   right breast, lumpectomy w/nodes  . CHONDROPLASTY Left 02/10/2018   Procedure: CHONDROPLASTY;  Surgeon: Lovell Sheehan, MD;  Location: [facility];  Service: Orthopedics;  Laterality: Left;  Marland Kitchen GASTRIC RESTRICTION SURGERY     for reflux  . HYSTEROSCOPY W/D&C N/A 10/18/2012   Procedure: DILATATION AND CURETTAGE /HYSTEROSCOPY;  Surgeon: Mora Bellman, MD;  Location: [place] [facility];  Service: Gynecology;  Laterality: N/A;  . KNEE ARTHROSCOPY WITH MEDIAL MENISECTOMY Left 02/10/2018   Procedure: KNEE ARTHROSCOPY WITH MEDIAL and LATERAL MENISECTOMY;  Surgeon: Lovell Sheehan, MD;  Location: [facility];  Service: Orthopedics;  Laterality: Left;  . POLYPECTOMY N/A 10/18/2012   Procedure: POLYPECTOMY;  Surgeon: Mora Bellman, MD;  Location: [facility];  Service: Gynecology;  Laterality: N/A;  . SYNOVECTOMY Left 02/10/2018   Procedure: SYNOVECTOMY;  Surgeon: Lovell Sheehan, MD;  Location: [facility];  Service: Orthopedics;  Laterality: Left;   Family History  Problem Relation Age of Onset  . Diabetes Mother   . Heart disease Mother        Pacemaker, CHF  . Hypertension Mother   . Kidney cancer Mother   . Stroke Father   . CAD Father 49       Died with MI  . Esophageal cancer Brother   . Heart disease Sister   . Diabetes Sister   .  Colon cancer Neg Hx   . Rectal cancer Neg Hx   . Stomach cancer Neg Hx    Social History   Socioeconomic History  . Marital status: Single    Spouse name: Not on file  . Number of children: 0  . Years of education: Not on file  . Highest education level: Not on file  Occupational History  . Occupation: retired    Fish farm manager: RETIRED  Social Needs  . Financial resource strain: Not on file  . Food insecurity    Worry: Not on file    Inability: Not on file  . Transportation needs    Medical: Not on file    Non-medical: Not on file  Tobacco Use  . Smoking status: Never Smoker  . Smokeless tobacco: Never Used  Substance and Sexual  Activity  . Alcohol use: Never    Alcohol/week: 0.0 standard drinks    Frequency: Never  . Drug use: No  . Sexual activity: Not Currently    Birth control/protection: Post-menopausal  Lifestyle  . Physical activity    Days per week: Not on file    Minutes per session: Not on file  . Stress: Not on file  Relationships  . Social Herbalist on phone: Not on file    Gets together: Not on file    Attends religious service: Not on file    Active member of club or organization: Not on file    Attends meetings of clubs or organizations: Not on file    Relationship status: Not on file  Other Topics Concern  . Not on file  Social History Narrative   Lives with, and cares for her elderly mother.    Outpatient Encounter Medications as of 01/10/2019  Medication Sig  . acetaminophen (TYLENOL) 500 MG tablet Take 1,000 mg by mouth every 6 (six) hours as needed for mild pain.   Marland Kitchen albuterol (PROVENTIL HFA;VENTOLIN HFA) 108 (90 Base) MCG/ACT inhaler Inhale 2 puffs into the lungs every 4 (four) hours as needed for wheezing.  Marland Kitchen amitriptyline (ELAVIL) 25 MG tablet Take 1 tablet (25 mg total) by mouth at bedtime.  Marland Kitchen aspirin 81 MG tablet Take 81 mg by mouth at bedtime.   . calcium elemental as carbonate (TUMS ULTRA 1000) 400 MG chewable tablet Chew 1,000 mg by mouth daily as needed for heartburn.  . Cholecalciferol (VITAMIN D) 2000 units tablet Take 2,000 Units by mouth daily.  Marland Kitchen docusate sodium (COLACE) 100 MG capsule Take 1 capsule (100 mg total) by mouth daily as needed.  . fenofibrate 54 MG tablet TAKE 1 TABLET(54 MG) BY MOUTH DAILY  . ferrous sulfate 325 (65 FE) MG tablet Take 1 tablet (325 mg total) by mouth 2 (two) times daily with a meal.  . glipiZIDE (GLUCOTROL XL) 2.5 MG 24 hr tablet TAKE 1 TABLET BY MOUTH  EVERY EVENING  . hydrochlorothiazide (HYDRODIURIL) 25 MG tablet TAKE 1 TABLET BY MOUTH  DAILY  . HYDROcodone-acetaminophen (NORCO/VICODIN) 5-325 MG tablet Take 1 tablet by mouth  every 6 (six) hours as needed for moderate pain or severe pain.  Marland Kitchen loratadine (CLARITIN) 10 MG tablet Take 10 mg by mouth daily as needed for allergies.  Marland Kitchen losartan (COZAAR) 100 MG tablet TAKE 1 TABLET BY MOUTH  DAILY (Patient taking differently: 50 mg daily. )  . meclizine (ANTIVERT) 25 MG tablet Take 1 tablet (25 mg total) by mouth 3 (three) times daily as needed for dizziness.  . metFORMIN (GLUCOPHAGE) 1000 MG  tablet TAKE 1 TABLET BY MOUTH TWO  TIMES DAILY WITH A MEAL  . omeprazole (PRILOSEC) 20 MG capsule TAKE 1 CAPSULE BY MOUTH  DAILY  . ONETOUCH DELICA LANCETS 82N MISC Check blood sugar once  daily and as directed. Dx  E11.9  . ONETOUCH VERIO test strip TEST BLOOD SUGAR ONCE DAILY FOR DIABETES MELLITUS AS AS NEEDED.  Marland Kitchen SALINE NASAL SPRAY NA Place 1 spray into the nose daily as needed (congestion).    No facility-administered encounter medications on file as of 01/10/2019.     Activities of Daily Living In your present state of health, do you have any difficulty performing the following activities: 01/10/2019 02/02/2018  Hearing? N N  Vision? N N  Difficulty concentrating or making decisions? N N  Walking or climbing stairs? N Y  Comment - Related to current knee injury.  Dressing or bathing? N N  Doing errands, shopping? N N  Preparing Food and eating ? N -  Using the Toilet? N -  In the past six months, have you accidently leaked urine? N -  Do you have problems with loss of bowel control? N -  Managing your Medications? N -  Managing your Finances? N -  Housekeeping or managing your Housekeeping? N -  Some recent data might be hidden    Patient Care Team: Tower, Wynelle Fanny, MD as PCP - General    Assessment:   This is a routine wellness examination for [facility].   Hearing Screening   125Hz  250Hz  500Hz  1000Hz  2000Hz  3000Hz  4000Hz  6000Hz  8000Hz   Right ear:           Left ear:           Vision Screening Comments: Vision exam in Oct 2019 with Dr. Sandra Cockayne; future appt scheduled  05/09/19   Exercise Activities and Dietary recommendations Current Exercise Habits: Home exercise routine, Type of exercise: stretching;walking, Time (Minutes): 60, Frequency (Times/Week): 7, Weekly Exercise (Minutes/Week): 420, Intensity: Mild, Exercise limited by: None identified  Goals    . DIET - INCREASE WATER INTAKE     Starting 01/10/2019, I will continue to drink at least 6-8 glasses of water daily.        Fall Risk Fall Risk  01/10/2019 12/21/2017 12/17/2016  Falls in the past year? 0 No No   Depression Screen PHQ 2/9 Scores 01/10/2019 12/21/2017 03/01/2014 02/10/2013  PHQ - 2 Score 0 0 0 0  PHQ- 9 Score 0 0 - -     Cognitive Function MMSE - Mini Mental State Exam 01/10/2019 12/21/2017  Orientation to time 5 5  Orientation to Place 5 5  Registration 3 3  Attention/ Calculation 0 0  Recall 3 3  Language- name 2 objects 0 0  Language- repeat 1 1  Language- follow 3 step command 0 3  Language- read & follow direction 0 0  Write a sentence 0 0  Copy design 0 0  Total score 17 20       PLEASE NOTE: A Mini-Cog screen was completed. Maximum score is 17. A value of 0 denotes this part of [place] MMSE was not completed or the patient failed this part of the Mini-Cog screening.   Mini-Cog Screening Orientation to Time - Max 5 pts Orientation to Place - Max 5 pts Registration - Max 3 pts Recall - Max 3 pts Language Repeat - Max 1 pts    Immunization History  Administered Date(s) Administered  . Influenza Split 04/09/2011, 04/01/2012  . Influenza Whole  04/23/2007, 04/10/2008, 04/10/2009, 04/02/2010  . Influenza,inj,Quad PF,6+ Mos 03/23/2013, 03/29/2014, 03/13/2015, 04/10/2016, 03/25/2017, 04/08/2018  . Pneumococcal Conjugate-13 02/08/2016  . Pneumococcal Polysaccharide-23 04/13/2008, 05/16/2013, 02/26/2017  . Td 02/09/2009  . Zoster 01/28/2011    Screening Tests Health Maintenance  Topic Date Due  . COLON CANCER SCREENING ANNUAL FOBT  08/15/2020 (Originally  05/06/2013)  . Hepatitis C Screening  08/15/2020 (Originally 1950-12-26)  . TETANUS/TDAP  02/10/2019  . INFLUENZA VACCINE  02/12/2019  . MAMMOGRAM  05/01/2019  . OPHTHALMOLOGY EXAM  05/06/2019  . FOOT EXAM  05/12/2019  . HEMOGLOBIN A1C  07/12/2019  . COLONOSCOPY  05/07/2023  . DEXA SCAN  Completed  . PNA vac Low Risk Adult  Completed      Plan:     I have personally reviewed, addressed, and noted the following in the patient's chart:  A. Medical and social history B. Use of alcohol, tobacco or illicit drugs  C. Current medications and supplements D. Functional ability and status E.  Nutritional status F.  Physical activity G. Advance directives H. List of other physicians I.  Hospitalizations, surgeries, and ER visits in previous 12 months J.  Vitals (unless it is a telemedicine encounter) K. Screenings to include cognitive, depression, hearing, vision (NOTE: hearing and vision screenings not completed in telemedicine encounter) L. Referrals and appointments   In addition, I have reviewed and discussed with patient certain preventive protocols, quality metrics, and best practice recommendations. A written personalized care plan for preventive services and recommendations were provided to patient.  With patient's permission, we connected on 01/10/19 at  8:00 AM EDT by a video enabled telemedicine application. Two patient identifiers were used to ensure the encounter occurred with the correct person.    Patient was in home and writer was in office.   Signed,   Lindell Noe, MHA, BS, RN Health Coach

## 2019-01-10 NOTE — Patient Instructions (Signed)
Martha Hamilton , Thank you for taking time to come for your Medicare Wellness Visit. I appreciate your ongoing commitment to your health goals. Please review the following plan we discussed and let me know if I can assist you in the future.   These are the goals we discussed: Goals    . DIET - INCREASE WATER INTAKE     Starting 01/10/2019, I will continue to drink at least 6-8 glasses of water daily.        This is a list of the screening recommended for you and due dates:  Health Maintenance  Topic Date Due  . Stool Blood Test  08/15/2020*  .  Hepatitis C: One time screening is recommended by Center for Disease Control  (CDC) for  adults born from [birth year] through 1965.   08/15/2020*  . Tetanus Vaccine  02/10/2019  . Flu Shot  02/12/2019  . Mammogram  05/01/2019  . Eye exam for diabetics  05/06/2019  . Complete foot exam   05/12/2019  . Hemoglobin A1C  07/12/2019  . Colon Cancer Screening  05/07/2023  . DEXA scan (bone density measurement)  Completed  . Pneumonia vaccines  Completed  *Topic was postponed. The date shown is not the original due date.   Preventive Care for Adults  A healthy lifestyle and preventive care can promote health and wellness. Preventive health guidelines for adults include the following key practices.  . A routine yearly physical is a good way to check with your health care provider about your health and preventive screening. It is a chance to share any concerns and updates on your health and to receive a thorough exam.  . Visit your dentist for a routine exam and preventive care every 6 months. Brush your teeth twice a day and floss once a day. Good oral hygiene prevents tooth decay and gum disease.  . The frequency of eye exams is based on your age, health, family medical history, use  of contact lenses, and other factors. Follow your health care provider's recommendations for frequency of eye exams.  . Eat a healthy diet. Foods like vegetables, fruits, whole  grains, low-fat dairy products, and lean protein foods contain the nutrients you need without too many calories. Decrease your intake of foods high in solid fats, added sugars, and salt. Eat the right amount of calories for you. Get information about a proper diet from your health care provider, if necessary.  . Regular physical exercise is one of the most important things you can do for your health. Most adults should get at least 150 minutes of moderate-intensity exercise (any activity that increases your heart rate and causes you to sweat) each week. In addition, most adults need muscle-strengthening exercises on 2 or more days a week.  Silver Sneakers may be a benefit available to you. To determine eligibility, you may visit the website: www.silversneakers.com or contact program at (870) 729-3250 Mon-Fri between 8AM-8PM.   . Maintain a healthy weight. The body mass index (BMI) is a screening tool to identify possible weight problems. It provides an estimate of body fat based on height and weight. Your health care provider can find your BMI and can help you achieve or maintain a healthy weight.   For adults 20 years and older: ? A BMI below 18.5 is considered underweight. ? A BMI of 18.5 to 24.9 is normal. ? A BMI of 25 to 29.9 is considered overweight. ? A BMI of 30 and above is considered obese.   Marland Kitchen  Maintain normal blood lipids and cholesterol levels by exercising and minimizing your intake of saturated fat. Eat a balanced diet with plenty of fruit and vegetables. Blood tests for lipids and cholesterol should begin at age 79 and be repeated every 5 years. If your lipid or cholesterol levels are high, you are over 50, or you are at high risk for heart disease, you may need your cholesterol levels checked more frequently. Ongoing high lipid and cholesterol levels should be treated with medicines if diet and exercise are not working.  . If you smoke, find out from your health care provider how to  quit. If you do not use tobacco, please do not start.  . If you choose to drink alcohol, please do not consume more than 2 drinks per day. One drink is considered to be 12 ounces (355 mL) of beer, 5 ounces (148 mL) of wine, or 1.5 ounces (44 mL) of liquor.  . If you are 88-44 years old, ask your health care provider if you should take aspirin to prevent strokes.  . Use sunscreen. Apply sunscreen liberally and repeatedly throughout the day. You should seek shade when your shadow is shorter than you. Protect yourself by wearing long sleeves, pants, a wide-brimmed hat, and sunglasses year round, whenever you are outdoors.  . Once a month, do a whole body skin exam, using a mirror to look at the skin on your back. Tell your health care provider of new moles, moles that have irregular borders, moles that are larger than a pencil eraser, or moles that have changed in shape or color.

## 2019-01-11 ENCOUNTER — Other Ambulatory Visit: Payer: Medicare Other

## 2019-01-11 ENCOUNTER — Ambulatory Visit (INDEPENDENT_AMBULATORY_CARE_PROVIDER_SITE_OTHER): Payer: Medicare Other | Admitting: Family Medicine

## 2019-01-11 ENCOUNTER — Encounter: Payer: Self-pay | Admitting: Family Medicine

## 2019-01-11 VITALS — BP 142/80 | HR 109 | Temp 98.3°F | Ht 59.5 in | Wt 154.1 lb

## 2019-01-11 DIAGNOSIS — E119 Type 2 diabetes mellitus without complications: Secondary | ICD-10-CM | POA: Diagnosis not present

## 2019-01-11 DIAGNOSIS — D473 Essential (hemorrhagic) thrombocythemia: Secondary | ICD-10-CM

## 2019-01-11 DIAGNOSIS — I10 Essential (primary) hypertension: Secondary | ICD-10-CM

## 2019-01-11 DIAGNOSIS — D649 Anemia, unspecified: Secondary | ICD-10-CM

## 2019-01-11 DIAGNOSIS — R000 Tachycardia, unspecified: Secondary | ICD-10-CM | POA: Insufficient documentation

## 2019-01-11 DIAGNOSIS — Z0000 Encounter for general adult medical examination without abnormal findings: Secondary | ICD-10-CM

## 2019-01-11 DIAGNOSIS — E669 Obesity, unspecified: Secondary | ICD-10-CM

## 2019-01-11 DIAGNOSIS — R1013 Epigastric pain: Secondary | ICD-10-CM

## 2019-01-11 DIAGNOSIS — E782 Mixed hyperlipidemia: Secondary | ICD-10-CM

## 2019-01-11 DIAGNOSIS — D75839 Thrombocytosis, unspecified: Secondary | ICD-10-CM

## 2019-01-11 LAB — LIPID PANEL
Cholesterol: 184 mg/dL (ref 0–200)
HDL: 38.7 mg/dL — ABNORMAL LOW (ref >39.00)
Total CHOL/HDL Ratio: 5
Triglycerides: 421 mg/dL — ABNORMAL HIGH (ref 0.0–149.0)

## 2019-01-11 LAB — COMPREHENSIVE METABOLIC PANEL
ALT: 12 U/L (ref 0–35)
AST: 13 U/L (ref 0–37)
Albumin: 4.1 g/dL (ref 3.5–5.2)
Alkaline Phosphatase: 38 U/L — ABNORMAL LOW (ref 39–117)
BUN: 22 mg/dL (ref 6–23)
CO2: 23 mEq/L (ref 19–32)
Calcium: 9.5 mg/dL (ref 8.4–10.5)
Chloride: 100 mEq/L (ref 96–112)
Creatinine, Ser: 1.09 mg/dL (ref 0.40–1.20)
GFR: 49.92 mL/min — ABNORMAL LOW (ref >60.00)
Glucose, Bld: 103 mg/dL — ABNORMAL HIGH (ref 70–99)
Potassium: 4.4 mEq/L (ref 3.5–5.1)
Sodium: 138 mEq/L (ref 135–145)
Total Bilirubin: 0.2 mg/dL (ref 0.2–1.2)
Total Protein: 7.2 g/dL (ref 6.0–8.3)

## 2019-01-11 LAB — LDL CHOLESTEROL, DIRECT: Direct LDL: 99 mg/dL

## 2019-01-11 MED ORDER — GLIPIZIDE ER 2.5 MG PO TB24: 2.5000 mg | ORAL_TABLET | Freq: Every evening | ORAL | 3 refills | Status: DC

## 2019-01-11 MED ORDER — METOPROLOL SUCCINATE ER 25 MG PO TB24: 25.0000 mg | ORAL_TABLET | Freq: Every day | ORAL | 3 refills | Status: DC

## 2019-01-11 MED ORDER — FENOFIBRATE 54 MG PO TABS: ORAL_TABLET | ORAL | 3 refills | Status: DC

## 2019-01-11 MED ORDER — ALBUTEROL SULFATE HFA 108 (90 BASE) MCG/ACT IN AERS: 2.0000 | INHALATION_SPRAY | RESPIRATORY_TRACT | 5 refills | Status: DC | PRN

## 2019-01-11 MED ORDER — AMITRIPTYLINE HCL 25 MG PO TABS: 25.0000 mg | ORAL_TABLET | Freq: Every day | ORAL | 3 refills | Status: DC

## 2019-01-11 MED ORDER — HYDROCHLOROTHIAZIDE 25 MG PO TABS: 25.0000 mg | ORAL_TABLET | Freq: Every day | ORAL | 3 refills | Status: DC

## 2019-01-11 MED ORDER — LOSARTAN POTASSIUM 50 MG PO TABS: 50.0000 mg | ORAL_TABLET | Freq: Every day | ORAL | 3 refills | Status: DC

## 2019-01-11 MED ORDER — METFORMIN HCL 1000 MG PO TABS: 1000.0000 mg | ORAL_TABLET | Freq: Two times a day (BID) | ORAL | 3 refills | Status: DC

## 2019-01-11 NOTE — Patient Instructions (Addendum)
Follow up 3 months for pulse /BP If side effects to toprol -stop it and let me know  We will call you about a GI referral   We will alert you when labs return

## 2019-01-11 NOTE — Assessment & Plan Note (Signed)
Disc goals for lipids and reasons to control them Rev last labs with pt Rev low sat fat diet in detail Pending labs Goal of LDL under 70 Intolerant of statins  Continues fenofibrate for triglyceride control

## 2019-01-11 NOTE — Progress Notes (Signed)
Subjective:    Patient ID: Martha Hamilton, female    DOB: 1950/09/09, 68 y.o.   MRN: [id number]  HPI Here for health maintenance exam and to review chronic medical problems    Had amw on 6/29-no gaps or concerns Labs have not returned from yesterday  She cannot eat first thing in the am  Abdominal pain and nausea and gagging  ppi bid is not helping (it improved - in dec and then got worse again)  Belching a lot  Past hx of fundoplication  She still has a gallbladder  Needs to see GI  Has a skin bump on her R breast she wants to check   Other than that    Wt Readings from Last 3 Encounters:  01/11/19 154 lb 1 oz (69.9 kg)  06/21/18 158 lb 8 oz (71.9 kg)  05/25/18 157 lb 8 oz (71.4 kg)  not able to eat much  30.60 kg/m   Colon cancer screening -colonoscopy 10/14 with 10 y recall   Mammogram 10/19  Self breast exam - no lumps   dexa 3 /18-normal bmd  Takes vit D daily  Exercise- a lot of walking    bp is high on first check- fine at home  No cp or palpitations or headaches or edema  No side effects to medicines  BP Readings from Last 3 Encounters:  01/11/19 (!) 154/70  06/21/18 136/76  05/25/18 128/74    BP: (!) 142/80    Improved on 2nd check after sitting   Pulse Readings from Last 3 Encounters:  01/11/19 (!) 109  06/21/18 (!) 101  05/25/18 (!) 107  h/o elevated pulse  Pt states cardiology is aware  Takes losartan and hctz   DM2 Diabetes Home sugar results - very good / no lows / no highs  DM diet -very good  Exercise - lots of walking  Symptoms A1C last  Lab Results  Component Value Date   HGBA1C 5.5 05/11/2018   No problems with medications  Renal protection= arb  Last eye exam  10/19   H/o iron def and anemia/ elevated platelets Lab Results  Component Value Date   WBC 6.4 06/21/2018   HGB 11.5 (L) 06/21/2018   HCT 34.4 (L) 06/21/2018   MCV 86.5 06/21/2018   PLT 503.0 (H) 06/21/2018   chronic/iron def  Still taking iron    Energy level is pretty good   Hyperlipidemia Intolerant of statins and niacin Pend labs Lab Results  Component Value Date   CHOL 187 01/19/2018   HDL 46.80 01/19/2018   LDLCALC 51 10/21/2011   LDLDIRECT 107.0 01/19/2018   TRIG 301.0 (H) 01/19/2018   CHOLHDL 4 01/19/2018  eating healthy for the most part  Patient Active Problem List   Diagnosis Date Noted   Tachycardia 01/11/2019   Fatty liver 06/21/2018   Obesity (BMI 30-39.9) 06/21/2018   Dyspepsia 05/25/2018   GERD (gastroesophageal reflux disease) 05/25/2018   H/O vertigo 05/11/2018   Pre-op examination 01/22/2018   Thrombocytosis ([place]) 12/25/2017   Left knee pain 09/21/2017   Low back pain 07/06/2017   Welcome to Medicare preventive visit 12/17/2016   Grief reaction 12/17/2016   Estrogen deficiency 09/08/2016   Muscle pain 02/08/2016   Anemia 02/09/2013   Abnormal EKG 07/27/2012   Special screening for malignant neoplasms, colon 04/29/2011   Gynecological examination 04/29/2011   Routine general medical examination at a health care facility 04/20/2011   OSTEOARTHRITIS, HANDS, BILATERAL 10/31/2008   Diabetes type 2,  controlled ([employer]) 07/10/2008   Hyperlipidemia 01/26/2008   Essential hypertension 01/26/2008   ALLERGIC RHINITIS 12/07/2007   Past Medical History:  Diagnosis Date   Allergic rhinitis    Breast cancer ([place]) 1996   right breast   DM2 (diabetes mellitus, type 2) ([facility])    GERD (gastroesophageal reflux disease)    Hx of cardiovascular stress test    a. Lex MV 2/14:  EF 69%, no ischemia   Hyperlipidemia    Hypertension    Iron deficiency anemia    Obesity    Osteoarthritis    hands   Ulcer    Past Surgical History:  Procedure Laterality Date   APPENDECTOMY  1976   BREAST LUMPECTOMY Right 1996   right breast, lumpectomy w/nodes   CHONDROPLASTY Left 02/10/2018   Procedure: CHONDROPLASTY;  Surgeon: Lovell Sheehan, MD;  Location: [facility];  Service:  Orthopedics;  Laterality: Left;   GASTRIC RESTRICTION SURGERY     for reflux   HYSTEROSCOPY W/D&C N/A 10/18/2012   Procedure: DILATATION AND CURETTAGE /HYSTEROSCOPY;  Surgeon: Mora Bellman, MD;  Location: [facility];  Service: Gynecology;  Laterality: N/A;   KNEE ARTHROSCOPY WITH MEDIAL MENISECTOMY Left 02/10/2018   Procedure: KNEE ARTHROSCOPY WITH MEDIAL and LATERAL MENISECTOMY;  Surgeon: Lovell Sheehan, MD;  Location: [facility];  Service: Orthopedics;  Laterality: Left;   POLYPECTOMY N/A 10/18/2012   Procedure: POLYPECTOMY;  Surgeon: Mora Bellman, MD;  Location: [facility];  Service: Gynecology;  Laterality: N/A;   SYNOVECTOMY Left 02/10/2018   Procedure: SYNOVECTOMY;  Surgeon: Lovell Sheehan, MD;  Location: [facility];  Service: Orthopedics;  Laterality: Left;   Social History   Tobacco Use   Smoking status: Never Smoker   Smokeless tobacco: Never Used  Substance Use Topics   Alcohol use: Never    Alcohol/week: 0.0 standard drinks    Frequency: Never   Drug use: No   Family History  Problem Relation Age of Onset   Diabetes Mother    Heart disease Mother        Pacemaker, CHF   Hypertension Mother    Kidney cancer Mother    Stroke Father    CAD Father 77       Died with MI   Esophageal cancer Brother    Heart disease Sister    Diabetes Sister    Colon cancer Neg Hx    Rectal cancer Neg Hx    Stomach cancer Neg Hx    Allergies  Allergen Reactions   Cholestyramine Other (See Comments)    REACTION: Muscles tightened up, drew up.   Lipitor [Atorvastatin] Other (See Comments)    Muscle cramping   Niacin Nausea And Vomiting   Simvastatin     Muscular pain   Current Outpatient Medications on File Prior to Visit  Medication Sig Dispense Refill   acetaminophen (TYLENOL) 500 MG tablet Take 1,000 mg by mouth every 6 (six) hours as needed for mild pain.      aspirin 81 MG tablet Take 81 mg by mouth at bedtime.      calcium elemental as carbonate (TUMS  ULTRA 1000) 400 MG chewable tablet Chew 1,000 mg by mouth daily as needed for heartburn.     Cholecalciferol (VITAMIN D) 2000 units tablet Take 2,000 Units by mouth daily.     docusate sodium (COLACE) 100 MG capsule Take 1 capsule (100 mg total) by mouth daily as needed. 30 capsule 2   ferrous sulfate 325 (65 FE) MG tablet Take 1  tablet (325 mg total) by mouth 2 (two) times daily with a meal. 180 tablet 3   HYDROcodone-acetaminophen (NORCO/VICODIN) 5-325 MG tablet Take 1 tablet by mouth every 6 (six) hours as needed for moderate pain or severe pain. 15 tablet 0   loratadine (CLARITIN) 10 MG tablet Take 10 mg by mouth daily as needed for allergies.     meclizine (ANTIVERT) 25 MG tablet Take 1 tablet (25 mg total) by mouth 3 (three) times daily as needed for dizziness. 30 tablet 3   omeprazole (PRILOSEC) 20 MG capsule TAKE 1 CAPSULE BY MOUTH  DAILY (Patient taking differently: Take 20 mg by mouth 2 (two) times a day. ) 90 capsule 1   ONETOUCH DELICA LANCETS 50K MISC Check blood sugar once  daily and as directed. Dx  E11.9 100 each 3   ONETOUCH VERIO test strip TEST BLOOD SUGAR ONCE DAILY FOR DIABETES MELLITUS AS AS NEEDED. 100 each 3   SALINE NASAL SPRAY NA Place 1 spray into the nose daily as needed (congestion).      No current facility-administered medications on file prior to visit.      Review of Systems  Constitutional: Positive for fatigue. Negative for activity change, appetite change, fever and unexpected weight change.  HENT: Negative for congestion, ear pain, rhinorrhea, sinus pressure and sore throat.   Eyes: Negative for pain, redness and visual disturbance.  Respiratory: Negative for cough, shortness of breath and wheezing.   Cardiovascular: Negative for chest pain and palpitations.  Gastrointestinal: Positive for abdominal pain and nausea. Negative for abdominal distention, blood in stool, constipation, diarrhea, rectal pain and vomiting.  Endocrine: Negative for  polydipsia and polyuria.  Genitourinary: Negative for dysuria, frequency and urgency.  Musculoskeletal: Negative for arthralgias, back pain and myalgias.  Skin: Negative for pallor and rash.  Allergic/Immunologic: Negative for environmental allergies.  Neurological: Negative for dizziness, syncope and headaches.  Hematological: Negative for adenopathy. Does not bruise/bleed easily.  Psychiatric/Behavioral: Negative for decreased concentration and dysphoric mood. The patient is not nervous/anxious.        Objective:   Physical Exam Constitutional:      General: She is not in acute distress.    Appearance: Normal appearance. She is well-developed. She is obese. She is not ill-appearing or diaphoretic.  HENT:     Head: Normocephalic and atraumatic.     Right Ear: Tympanic membrane, ear canal and external ear normal.     Left Ear: Tympanic membrane, ear canal and external ear normal.     Nose: Nose normal.     Mouth/Throat:     Mouth: Mucous membranes are moist.     Pharynx: Oropharynx is clear.  Eyes:     General: No scleral icterus.    Conjunctiva/sclera: Conjunctivae normal.     Pupils: Pupils are equal, round, and reactive to light.  Neck:     Musculoskeletal: Normal range of motion and neck supple.     Thyroid: No thyromegaly.     Vascular: No carotid bruit or JVD.  Cardiovascular:     Rate and Rhythm: Regular rhythm. Tachycardia present.     Heart sounds: Normal heart sounds. No gallop.   Pulmonary:     Effort: Pulmonary effort is normal. No respiratory distress.     Breath sounds: Normal breath sounds. No wheezing.  Chest:     Chest wall: No tenderness.  Abdominal:     General: Bowel sounds are normal. There is no distension or abdominal bruit.     Palpations:  Abdomen is soft. There is no mass.     Tenderness: There is no abdominal tenderness.  Genitourinary:    Comments: Breast exam: No mass, nodules, thickening, tenderness, bulging, retraction, inflamation, nipple  discharge or skin changes noted.  No axillary or clavicular LA.     Benign appearing flesh colored nevus on R nipple  Musculoskeletal: Normal range of motion.        General: No tenderness.     Right lower leg: No edema.     Left lower leg: No edema.  Lymphadenopathy:     Cervical: No cervical adenopathy.  Skin:    General: Skin is warm and dry.     Coloration: Skin is not pale.     Findings: No erythema or rash.     Comments: Solar lentigines diffusely   Neurological:     Mental Status: She is alert.     Cranial Nerves: No cranial nerve deficit.     Motor: No abnormal muscle tone.     Coordination: Coordination normal.     Gait: Gait normal.     Deep Tendon Reflexes: Reflexes are normal and symmetric. Reflexes normal.  Psychiatric:        Mood and Affect: Mood normal.           Assessment & Plan:   Problem List Items Addressed This Visit      Cardiovascular and Mediastinum   Essential hypertension    bp in fair control at this time  BP Readings from Last 1 Encounters:  01/11/19 (!) 142/80   No changes needed Most recent labs reviewed  Disc lifstyle change with low sodium diet and exercise        Relevant Medications   metoprolol succinate (TOPROL-XL) 25 MG 24 hr tablet   losartan (COZAAR) 50 MG tablet   fenofibrate 54 MG tablet   hydrochlorothiazide (HYDRODIURIL) 25 MG tablet     Endocrine   Diabetes type 2, controlled ([place])    Pending A1c- expect well controlled  Eye exam utd Good foot care On arb for renal protection  Intolerant of statins unfortunately  disc imp of low glycemic diet and wt loss to prevent DM2       Relevant Medications   losartan (COZAAR) 50 MG tablet   glipiZIDE (GLUCOTROL XL) 2.5 MG 24 hr tablet   metFORMIN (GLUCOPHAGE) 1000 MG tablet     Hematopoietic and Hemostatic   Thrombocytosis ([facility])    No bruising or bleeding c/o  Labs pending from yesterday        Other   Hyperlipidemia    Disc goals for lipids and reasons to  control them Rev last labs with pt Rev low sat fat diet in detail Pending labs Goal of LDL under 70 Intolerant of statins  Continues fenofibrate for triglyceride control        Relevant Medications   metoprolol succinate (TOPROL-XL) 25 MG 24 hr tablet   losartan (COZAAR) 50 MG tablet   fenofibrate 54 MG tablet   hydrochlorothiazide (HYDRODIURIL) 25 MG tablet   Routine general medical examination at a health care facility - Primary    Reviewed health habits including diet and exercise and skin cancer prevention Reviewed appropriate screening tests for age  Also reviewed health mt list, fam hx and immunization status , as well as social and family history   See HPI Labs pending from yesterday Ref to GI made for upper abd c/o  Lesion on R nipple appears b9- will watch  amw  reviewed Enc better lifestyle habits and wt loss         Anemia    Pt continues iron otc  Labs pending       Dyspepsia    Initially improved with bid ppi-now much worse  Past h/o fundoplication  Also at risk of DM gastroparesis  Ref to GI for eval       Relevant Orders   Ambulatory referral to Gastroenterology   Obesity (BMI 30-39.9)    Discussed how this problem influences overall health and the risks it imposes  Reviewed plan for weight loss with lower calorie diet (via better food choices and also portion control or program like weight watchers) and exercise building up to or more than 30 minutes 5 days per week including some aerobic activity         Relevant Medications   glipiZIDE (GLUCOTROL XL) 2.5 MG 24 hr tablet   metFORMIN (GLUCOPHAGE) 1000 MG tablet   Tachycardia    Pulse consistently up  Pulse Readings from Last 3 Encounters:  01/11/19 (!) 109  06/21/18 (!) 101  05/25/18 (!) 107   F/u 3 mo for re check with BP- may need medication for this

## 2019-01-11 NOTE — Assessment & Plan Note (Signed)
Initially improved with bid ppi-now much worse  Past h/o fundoplication  Also at risk of DM gastroparesis  Ref to GI for eval

## 2019-01-11 NOTE — Assessment & Plan Note (Signed)
bp in fair control at this time  BP Readings from Last 1 Encounters:  01/11/19 (!) 142/80   No changes needed Most recent labs reviewed  Disc lifstyle change with low sodium diet and exercise

## 2019-01-11 NOTE — Assessment & Plan Note (Signed)
No bruising or bleeding c/o  Labs pending from yesterday

## 2019-01-11 NOTE — Assessment & Plan Note (Signed)
Pulse consistently up  Pulse Readings from Last 3 Encounters:  01/11/19 (!) 109  06/21/18 (!) 101  05/25/18 (!) 107   F/u 3 mo for re check with BP- may need medication for this

## 2019-01-11 NOTE — Assessment & Plan Note (Signed)
Discussed how this problem influences overall health and the risks it imposes  Reviewed plan for weight loss with lower calorie diet (via better food choices and also portion control or program like weight watchers) and exercise building up to or more than 30 minutes 5 days per week including some aerobic activity    

## 2019-01-11 NOTE — Assessment & Plan Note (Signed)
Pt continues iron otc  Labs pending

## 2019-01-11 NOTE — Assessment & Plan Note (Signed)
Pending A1c- expect well controlled  Eye exam utd Good foot care On arb for renal protection  Intolerant of statins unfortunately  disc imp of low glycemic diet and wt loss to prevent DM2

## 2019-01-11 NOTE — Assessment & Plan Note (Signed)
Reviewed health habits including diet and exercise and skin cancer prevention Reviewed appropriate screening tests for age  Also reviewed health mt list, fam hx and immunization status , as well as social and family history   See HPI Labs pending from yesterday Ref to GI made for upper abd c/o  Lesion on R nipple appears b9- will watch  amw reviewed Enc better lifestyle habits and wt loss

## 2019-02-02 ENCOUNTER — Telehealth: Payer: Self-pay

## 2019-02-02 NOTE — Telephone Encounter (Signed)
Phone screening complete 

## 2019-02-03 ENCOUNTER — Telehealth: Payer: Self-pay

## 2019-02-03 ENCOUNTER — Ambulatory Visit (INDEPENDENT_AMBULATORY_CARE_PROVIDER_SITE_OTHER): Payer: Medicare Other | Admitting: Internal Medicine

## 2019-02-03 ENCOUNTER — Encounter: Payer: Self-pay | Admitting: Internal Medicine

## 2019-02-03 VITALS — Ht 59.5 in | Wt 153.0 lb

## 2019-02-03 DIAGNOSIS — D500 Iron deficiency anemia secondary to blood loss (chronic): Secondary | ICD-10-CM | POA: Diagnosis not present

## 2019-02-03 DIAGNOSIS — K5901 Slow transit constipation: Secondary | ICD-10-CM

## 2019-02-03 DIAGNOSIS — R1013 Epigastric pain: Secondary | ICD-10-CM

## 2019-02-03 DIAGNOSIS — K219 Gastro-esophageal reflux disease without esophagitis: Secondary | ICD-10-CM

## 2019-02-03 DIAGNOSIS — R110 Nausea: Secondary | ICD-10-CM | POA: Diagnosis not present

## 2019-02-03 MED ORDER — ONDANSETRON HCL 4 MG PO TABS: 4.0000 mg | ORAL_TABLET | ORAL | 2 refills | Status: DC | PRN

## 2019-02-03 MED ORDER — OMEPRAZOLE 40 MG PO CPDR: 40.0000 mg | DELAYED_RELEASE_CAPSULE | Freq: Two times a day (BID) | ORAL | 3 refills | Status: DC

## 2019-02-03 NOTE — Addendum Note (Signed)
Addended by: Audrea Muscat on: 02/03/2019 02:18 PM   Modules accepted: Orders

## 2019-02-03 NOTE — Patient Instructions (Signed)
1. We have sent the following medications to your pharmacy for you to pick up at your convenience:  Omeprazole and Zofran  2.  Continue twice daily iron supplement  3.  You have been scheduled for an endoscopy. Please follow written instructions given to you at your visit today. If you use inhalers (even only as needed), please bring them with you on the day of your procedure  4.  You have been scheduled for an abdominal ultrasound at [facility] (1st floor of hospital) on 02/09/2019 at 8:00am. Please arrive 15 minutes prior to your appointment for registration. Make certain not to have anything to eat or drink after midnight prior to your appointment. Should you need to reschedule your appointment, please contact radiology at [phone]. This test typically takes about 30 minutes to perform.  6.  Consider capsule endoscopy  7.  Consider gastric emptying scan  8.  HOLD diabetic medications the day of the procedure to avoid unwanted hypoglycemia

## 2019-02-03 NOTE — Telephone Encounter (Signed)
Erroneous encounter

## 2019-02-03 NOTE — Progress Notes (Signed)
HISTORY OF PRESENT ILLNESS:  Martha Hamilton is a 68 y.o. female with past medical history as listed below who schedules this telehealth medicine visit during the coronavirus pandemic regarding multiple abdominal complaints.  Patient has been evaluated in this office for GERD (status post prior fundoplication) and recurrent iron deficiency anemia.  Last office evaluation for the latter December 2017.  GI evaluations in 2014 included normal colonoscopy and ileoscopy.  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  Felt to be the culprit.  Subsequent normalization of blood counts with iron.  Recurrent iron deficiency led to upper endoscopy December 2017.  Mild inflammatory changes.  Again treated with iron with correction of anemia.  Subsequently told to reduce her iron to once daily.  Iron deficiency anemia slowly returned.  Back on iron twice daily as of 3 weeks ago.  Review of laboratories from January 10, 2019 revealed hemoglobin 10.7.  1 year ago ferritin level was 8.  Current complaints began in March.  She describes "stomach trouble".  Significant nausea, particularly with greasy meals.  As well occasional right-sided abdominal pain worse after meals.  No dysphasia.  She does report some pyrosis in the middle of the night for which she takes antacids.  She was told to increase her PPI (omeprazole 20 mg) to twice daily which has not helped significantly.  She has had diabetes since 2009.  She denies a diet aphasia.  She does have chronic constipation for which laxative of choice works nicely.  She has no prescription medication for nausea.  Review of outside x-rays from November 2019 include abdominal ultrasound to evaluate right upper quadrant epigastric pain.  Examination revealed changes consistent with fatty liver.  No acute abnormalities.  REVIEW OF SYSTEMS:  All non-GI ROS negative unless otherwise stated in the HPI except for allergies  Past Medical History:  Diagnosis Date  .  Allergic rhinitis   . Breast cancer ([place]) 1996   right breast  . DM2 (diabetes mellitus, type 2) ([place])   . GERD (gastroesophageal reflux disease)   . Hx of cardiovascular stress test    a. Lex MV 2/14:  EF 69%, no ischemia  . Hyperlipidemia   . Hypertension   . Iron deficiency anemia   . Obesity   . Osteoarthritis    hands  . Ulcer     Past Surgical History:  Procedure Laterality Date  . APPENDECTOMY  1976  . BREAST LUMPECTOMY Right 1996   right breast, lumpectomy w/nodes  . CHONDROPLASTY Left 02/10/2018   Procedure: CHONDROPLASTY;  Surgeon: Lovell Sheehan, MD;  Location: [facility];  Service: Orthopedics;  Laterality: Left;  [facility] Kitchen GASTRIC RESTRICTION SURGERY     for reflux  . HYSTEROSCOPY W/D&C N/A 10/18/2012   Procedure: DILATATION AND CURETTAGE /HYSTEROSCOPY;  Surgeon: Mora Bellman, MD;  Location: [facility];  Service: Gynecology;  Laterality: N/A;  . KNEE ARTHROSCOPY WITH MEDIAL MENISECTOMY Left 02/10/2018   Procedure: KNEE ARTHROSCOPY WITH MEDIAL and LATERAL MENISECTOMY;  Surgeon: Lovell Sheehan, MD;  Location: [facility];  Service: Orthopedics;  Laterality: Left;  . POLYPECTOMY N/A 10/18/2012   Procedure: POLYPECTOMY;  Surgeon: Mora Bellman, MD;  Location: [facility];  Service: Gynecology;  Laterality: N/A;  . SYNOVECTOMY Left 02/10/2018   Procedure: SYNOVECTOMY;  Surgeon: Lovell Sheehan, MD;  Location: [facility];  Service: Orthopedics;  Laterality: Left;    Social History Martha Hamilton  reports that she has never smoked. She has never used smokeless tobacco. She reports that she does  not drink alcohol or use drugs.  family history includes CAD (age of onset: 59) in her father; Diabetes in her mother and sister; Esophageal cancer in her brother; Heart disease in her mother and sister; Hypertension in her mother; Kidney cancer in her mother; Stroke in her father.  Allergies  Allergen Reactions  . Cholestyramine Other (See Comments)    REACTION: Muscles tightened up, drew up.  .  Lipitor [Atorvastatin] Other (See Comments)    Muscle cramping  . Niacin Nausea And Vomiting  . Simvastatin     Muscular pain       PHYSICAL EXAMINATION: No physical examination with telehealth medicine visit  ASSESSMENT:  1.  Multiple upper GI symptoms to include worsening reflux, nausea, postprandial abdominal discomfort.  Possible causes include uncontrolled reflux, diabetic gastroparesis, pyloric stenosis 2.  Recurrent iron deficiency anemia 3.  Normal colonoscopy and ileoscopy 2014 4.  GERD 5.  Multiple medical problems 6.  Functional constipation.  Managed with over-the-counter laxatives  PLAN:  1.  PRESCRIBE omeprazole 40 mg twice daily; #60; 11 refills 2.  Continue twice daily iron supplement 3.  SCHEDULE EGD to evaluate multiple upper GI symptoms.The nature of the procedure, as well as the risks, benefits, and alternatives were carefully and thoroughly reviewed with the patient. Ample time for discussion and questions allowed. The patient understood, was satisfied, and agreed to proceed. 4.  SCHEDULE abdominal ultrasound.  "Abdominal pain" 5.  PRESCRIBE Zofran 4 mg every 4 hours as needed for nausea; #90; 2 refills 6.  Consider capsule endoscopy 7.  Consider gastric emptying scan 8.  HOLD diabetic medications the day of the procedure to avoid unwanted hypoglycemia. This telehealth medicine visit was initiated by and consented for by the patient who was in her home while I was in my office during the encounter.  She understands her may be an associated professional charge for this service which totaled 25 minutes

## 2019-02-07 ENCOUNTER — Telehealth: Payer: Self-pay | Admitting: Internal Medicine

## 2019-02-07 NOTE — Telephone Encounter (Signed)
Covid-19 Screening Questions °  °  °Do you now or have you had a fever in the last 14 days?      °  °Do you have any respiratory symptoms of shortness of breath or cough now or in the last 14 days?     °  °Do you have any family members or close contacts with diagnosed or suspected Covid-19 in the past 14 days?      °  °Have you been tested for Covid-19 and found to be positive?     °  °Please make patient aware that care partner may wait in the car or come up to the lobby during the procedure but will need to provide their own mask. ° °

## 2019-02-08 ENCOUNTER — Encounter: Payer: Self-pay | Admitting: Internal Medicine

## 2019-02-08 ENCOUNTER — Other Ambulatory Visit: Payer: Self-pay

## 2019-02-08 ENCOUNTER — Ambulatory Visit (AMBULATORY_SURGERY_CENTER): Payer: Medicare Other | Admitting: Internal Medicine

## 2019-02-08 VITALS — BP 174/81 | HR 90 | Temp 98.6°F | Resp 21 | Ht 59.0 in | Wt 153.0 lb

## 2019-02-08 DIAGNOSIS — D500 Iron deficiency anemia secondary to blood loss (chronic): Secondary | ICD-10-CM

## 2019-02-08 DIAGNOSIS — K219 Gastro-esophageal reflux disease without esophagitis: Secondary | ICD-10-CM | POA: Diagnosis not present

## 2019-02-08 DIAGNOSIS — R1013 Epigastric pain: Secondary | ICD-10-CM

## 2019-02-08 MED ORDER — SODIUM CHLORIDE 0.9 % IV SOLN: 500.0000 mL | Freq: Once | INTRAVENOUS | Status: DC

## 2019-02-08 NOTE — Progress Notes (Signed)
Pt. Reports no change in her medical or surgical history since her pre-visit 02/03/2019.

## 2019-02-08 NOTE — Progress Notes (Signed)
Report to PACU, RN, vss, BBS= Clear.  

## 2019-02-08 NOTE — Progress Notes (Signed)
Temp check JB.  VS check CW.

## 2019-02-08 NOTE — Patient Instructions (Signed)
YOU HAD AN ENDOSCOPIC PROCEDURE TODAY AT [facility]:   Refer to the procedure report that was given to you for any specific questions about what was found during the examination.  If the procedure report does not answer your questions, please call your gastroenterologist to clarify.  If you requested that your care partner not be given the details of your procedure findings, then the procedure report has been included in a sealed envelope for you to review at your convenience later.  YOU SHOULD EXPECT: Some feelings of bloating in the abdomen. Passage of more gas than usual.  Walking can help get rid of the air that was put into your GI tract during the procedure and reduce the bloating. If you had a lower endoscopy (such as a colonoscopy or flexible sigmoidoscopy) you may notice spotting of blood in your stool or on the toilet paper. If you underwent a bowel prep for your procedure, you may not have a normal bowel movement for a few days.  Please Note:  You might notice some irritation and congestion in your nose or some drainage.  This is from the oxygen used during your procedure.  There is no need for concern and it should clear up in a day or so.  SYMPTOMS TO REPORT IMMEDIATELY:     Following upper endoscopy (EGD)  Vomiting of blood or coffee ground material  New chest pain or pain under the shoulder blades  Painful or persistently difficult swallowing  New shortness of breath  Fever of 100F or higher  Black, tarry-looking stools  For urgent or emergent issues, a gastroenterologist can be reached at any hour by calling [phone].   DIET:  We do recommend a small meal at first, but then you may proceed to your regular diet.  Drink plenty of fluids but you should avoid alcoholic beverages for 24 hours.  ACTIVITY:  You should plan to take it easy for the rest of today and you should NOT DRIVE or use heavy machinery until tomorrow (because of the sedation medicines  used during the test).    FOLLOW UP: Our staff will call the number listed on your records 48-72 hours following your procedure to check on you and address any questions or concerns that you may have regarding the information given to you following your procedure. If we do not reach you, we will leave a message.  We will attempt to reach you two times.  During this call, we will ask if you have developed any symptoms of COVID 19. If you develop any symptoms (ie: fever, flu-like symptoms, shortness of breath, cough etc.) before then, please call [phone].  If you test positive for Covid 19 in the 2 weeks post procedure, please call and report this information to [place].    If any biopsies were taken you will be contacted by phone or by letter within the next 1-3 weeks.  Please call us at [phone] if you have not heard about the biopsies in 3 weeks.    SIGNATURES/CONFIDENTIALITY: You and/or your care partner have signed paperwork which will be entered into your electronic medical record.  These signatures attest to the fact that that the information above on your After Visit Summary has been reviewed and is understood.  Full responsibility of the confidentiality of this discharge information lies with you and/or your care-partner.    Handout was given to you on GERD. Your blood sugar was 99 in the recovery room. Per Dr.  Henrene Pastor continue OMEPRAZOLE 40 MG twice daily, IRON supplement twice daily and ZOFRAN as needed for nausea. You may resume your other current medications today. Keep your ultrasound appointment that is scheduled for tomorrow. The office will call you with a follow-up appoint with Dr. Henrene Pastor for 4-6 weeks.  Repeat blood work a few days prior to your office visit. Please call if any questions or concerns.

## 2019-02-08 NOTE — Op Note (Signed)
[place] Patient Name: Martha Hamilton Procedure Date: 02/08/2019 8:09 AM MRN: [id number] Endoscopist: Docia Chuck. Henrene Pastor , MD Age: 68 Referring MD:  Date of Birth: 1950-11-02 Gender: Female Account #: [id number] Procedure:                Upper GI endoscopy Indications:              Abdominal pain in the right upper quadrant, Iron                            deficiency anemia, Nausea Medicines:                Monitored Anesthesia Care Procedure:                Pre-Anesthesia Assessment:                           - Prior to the procedure, a History and Physical                            was performed, and patient medications and                            allergies were reviewed. The patient's tolerance of                            previous anesthesia was also reviewed. The risks                            and benefits of the procedure and the sedation                            options and risks were discussed with the patient.                            All questions were answered, and informed consent                            was obtained. Prior Anticoagulants: The patient has                            taken no previous anticoagulant or antiplatelet                            agents. ASA Grade Assessment: II - A patient with                            mild systemic disease. After reviewing the risks                            and benefits, the patient was deemed in                            satisfactory condition to undergo the procedure.  After obtaining informed consent, the endoscope was                            passed under direct vision. Throughout the                            procedure, the patient's blood pressure, pulse, and                            oxygen saturations were monitored continuously. The                            Endoscope was introduced through the mouth, and                            advanced to the second part of  duodenum. The upper                            GI endoscopy was accomplished without difficulty.                            The patient tolerated the procedure well. Scope In: Scope Out: Findings:                 The esophagus was normal.                           The stomach revealed evidence of prior                            fundoplication but was otherwise normal.                           The examined duodenum was normal.                           The cardia and gastric fundus were normal on                            retroflexion. Complications:            No immediate complications. Estimated Blood Loss:     Estimated blood loss: none. Impression:               1. Normal EGD post fundoplication                           2. GERD                           3. Iron deficiency anemia                           4. Nausea. Recommendation:           1. Continue omeprazole 40 mg twice daily  2. Continue iron supplement twice daily                           3. Continue Zofran as needed for nausea                           4. Keep your ultrasound appointment that is                            scheduled for tomorrow                           5. Routine office follow-up with Dr. Henrene Pastor in 4 to                            6 weeks.                           6. Repeat CBC and ferritin level a few days prior                            to your office visit. Docia Chuck. Henrene Pastor, MD 02/08/2019 8:29:33 AM This report has been signed electronically.

## 2019-02-08 NOTE — Progress Notes (Signed)
No problems noted in the recovery room. maw 

## 2019-02-09 ENCOUNTER — Ambulatory Visit (HOSPITAL_COMMUNITY)
Admission: RE | Admit: 2019-02-09 | Discharge: 2019-02-09 | Disposition: A | Discharge status: Home / Self Care | Payer: Medicare Other | Source: Ambulatory Visit | Attending: Internal Medicine | Admitting: Internal Medicine

## 2019-02-09 DIAGNOSIS — R110 Nausea: Secondary | ICD-10-CM | POA: Diagnosis present

## 2019-02-09 DIAGNOSIS — K219 Gastro-esophageal reflux disease without esophagitis: Secondary | ICD-10-CM | POA: Diagnosis present

## 2019-02-09 DIAGNOSIS — D500 Iron deficiency anemia secondary to blood loss (chronic): Secondary | ICD-10-CM | POA: Diagnosis present

## 2019-02-09 DIAGNOSIS — R1013 Epigastric pain: Secondary | ICD-10-CM | POA: Insufficient documentation

## 2019-02-09 DIAGNOSIS — K5901 Slow transit constipation: Secondary | ICD-10-CM | POA: Diagnosis present

## 2019-02-10 ENCOUNTER — Telehealth: Payer: Self-pay | Admitting: *Deleted

## 2019-02-10 NOTE — Telephone Encounter (Signed)
  Follow up Call-  Call back number 02/08/2019 06/24/2016  Post procedure Call Back phone  # [phone]  Permission to leave phone message Yes Yes  Some recent data might be hidden     Patient questions:  Do you have a fever, pain , or abdominal swelling? No. Pain Score  0 *  Have you tolerated food without any problems? Yes.    Have you been able to return to your normal activities? Yes.    Do you have any questions about your discharge instructions: Diet   No. Medications  No. Follow up visit  No.  Do you have questions or concerns about your Care? No.  Actions: * If pain score is 4 or above: No action needed, pain <4.  1. Have you developed a fever since your procedure? no  2.   Have you had an respiratory symptoms (SOB or cough) since your procedure? no  3.   Have you tested positive for COVID 19 since your procedure no  4.   Have you had any family members/close contacts diagnosed with the COVID 19 since your procedure?  no   If yes to any of these questions please route to Joylene John, RN and Alphonsa Gin, Therapist, sports.

## 2019-02-24 ENCOUNTER — Other Ambulatory Visit: Payer: Self-pay | Admitting: Family Medicine

## 2019-02-25 MED ORDER — LOSARTAN POTASSIUM 50 MG PO TABS: 50.0000 mg | ORAL_TABLET | Freq: Every day | ORAL | 3 refills | Status: DC

## 2019-03-02 ENCOUNTER — Other Ambulatory Visit: Payer: Self-pay

## 2019-03-02 DIAGNOSIS — D500 Iron deficiency anemia secondary to blood loss (chronic): Secondary | ICD-10-CM

## 2019-03-10 ENCOUNTER — Other Ambulatory Visit (INDEPENDENT_AMBULATORY_CARE_PROVIDER_SITE_OTHER): Payer: Medicare Other

## 2019-03-10 ENCOUNTER — Ambulatory Visit (INDEPENDENT_AMBULATORY_CARE_PROVIDER_SITE_OTHER): Payer: Medicare Other

## 2019-03-10 ENCOUNTER — Other Ambulatory Visit: Payer: Self-pay

## 2019-03-10 DIAGNOSIS — Z23 Encounter for immunization: Secondary | ICD-10-CM

## 2019-03-10 DIAGNOSIS — D500 Iron deficiency anemia secondary to blood loss (chronic): Secondary | ICD-10-CM | POA: Diagnosis not present

## 2019-03-10 LAB — CBC WITH DIFFERENTIAL/PLATELET
Basophils Absolute: 0.1 10*3/uL (ref 0.0–0.1)
Basophils Relative: 0.7 % (ref 0.0–3.0)
Eosinophils Absolute: 0.2 10*3/uL (ref 0.0–0.7)
Eosinophils Relative: 3.1 % (ref 0.0–5.0)
HCT: 35.2 % — ABNORMAL LOW (ref 36.0–46.0)
Hemoglobin: 11.4 g/dL — ABNORMAL LOW (ref 12.0–15.0)
Lymphocytes Relative: 23.4 % (ref 12.0–46.0)
Lymphs Abs: 1.8 10*3/uL (ref 0.7–4.0)
MCHC: 32.3 g/dL (ref 30.0–36.0)
MCV: 89.9 fl (ref 78.0–100.0)
Monocytes Absolute: 0.5 10*3/uL (ref 0.1–1.0)
Monocytes Relative: 6 % (ref 3.0–12.0)
Neutro Abs: 5.1 10*3/uL (ref 1.4–7.7)
Neutrophils Relative %: 66.8 % (ref 43.0–77.0)
Platelets: 443 10*3/uL — ABNORMAL HIGH (ref 150.0–400.0)
RBC: 3.91 Mil/uL (ref 3.87–5.11)
RDW: 15.6 % — ABNORMAL HIGH (ref 11.5–15.5)
WBC: 7.6 10*3/uL (ref 4.0–10.5)

## 2019-03-10 LAB — FERRITIN: Ferritin: 11.1 ng/mL (ref 10.0–291.0)

## 2019-03-11 ENCOUNTER — Other Ambulatory Visit: Payer: Self-pay

## 2019-03-11 DIAGNOSIS — D500 Iron deficiency anemia secondary to blood loss (chronic): Secondary | ICD-10-CM

## 2019-03-12 ENCOUNTER — Other Ambulatory Visit: Payer: Self-pay | Admitting: Family Medicine

## 2019-03-18 ENCOUNTER — Ambulatory Visit (INDEPENDENT_AMBULATORY_CARE_PROVIDER_SITE_OTHER): Payer: Medicare Other | Admitting: Internal Medicine

## 2019-03-18 ENCOUNTER — Encounter: Payer: Self-pay | Admitting: Internal Medicine

## 2019-03-18 VITALS — BP 142/84 | HR 99 | Temp 98.5°F | Ht 59.5 in | Wt 152.2 lb

## 2019-03-18 DIAGNOSIS — K219 Gastro-esophageal reflux disease without esophagitis: Secondary | ICD-10-CM

## 2019-03-18 DIAGNOSIS — D500 Iron deficiency anemia secondary to blood loss (chronic): Secondary | ICD-10-CM | POA: Diagnosis not present

## 2019-03-18 NOTE — Patient Instructions (Signed)
Please follow up with Dr. Perry in one year 

## 2019-03-18 NOTE — Progress Notes (Signed)
HISTORY OF PRESENT ILLNESS:  Martha Hamilton is a 68 y.o. female with past medical history as listed below who was evaluated February 03, 2019 via telehealth medicine regarding multiple abdominal complaints.  See that dictation.  She has a history of GERD with prior fundoplication and recurrent iron deficiency anemia.  At the time of her last visit her omeprazole was increased to 40 mg twice daily.  She was continued on iron supplementation.  She was prescribed Zofran as needed for nausea.  Abdominal ultrasound was performed February 09, 2019.  The examination was unremarkable.  Importantly, no gallstones.  On February 08, 2019 she underwent upper endoscopy.  She was found to have evidence of prior fundoplication.  The examination was otherwise normal.  She follows up at this time.  Patient has been compliant with iron therapy.  We did check blood work on March 10, 2019, prior to this visit.  Hemoglobin has improved to 11.4.  Ferritin level now 11.1.  Patient tells me that she is feeling well.  Good energy levels.  No GI bleeding.  She does present an exhaustive list of various food items that she either does or does not tolerate.  She feels like she is "working things out".  Her bowel habits are more regular.  Her reflux is under better control with adjusted dose of PPI.  No dysphasia.  REVIEW OF SYSTEMS:  All non-GI ROS negative unless otherwise stated in the HPI except for muscle cramps, arthritis, back pain, fatigue  Past Medical History:  Diagnosis Date  . Allergic rhinitis   . Breast cancer ([place]) 1996   right breast  . DM2 (diabetes mellitus, type 2) ([place])   . GERD (gastroesophageal reflux disease)   . Hx of cardiovascular stress test    a. Lex MV 2/14:  EF 69%, no ischemia  . Hyperlipidemia   . Hypertension   . Iron deficiency anemia   . Obesity   . Osteoarthritis    hands  . Ulcer     Past Surgical History:  Procedure Laterality Date  . APPENDECTOMY  1976  . BREAST LUMPECTOMY Right 1996   right breast, lumpectomy w/nodes  . CHONDROPLASTY Left 02/10/2018   Procedure: CHONDROPLASTY;  Surgeon: Lovell Sheehan, MD;  Location: [facility];  Service: Orthopedics;  Laterality: Left;  Marland Kitchen GASTRIC RESTRICTION SURGERY     for reflux  . HYSTEROSCOPY W/D&C N/A 10/18/2012   Procedure: DILATATION AND CURETTAGE /HYSTEROSCOPY;  Surgeon: Mora Bellman, MD;  Location: [facility];  Service: Gynecology;  Laterality: N/A;  . KNEE ARTHROSCOPY WITH MEDIAL MENISECTOMY Left 02/10/2018   Procedure: KNEE ARTHROSCOPY WITH MEDIAL and LATERAL MENISECTOMY;  Surgeon: Lovell Sheehan, MD;  Location: [facility];  Service: Orthopedics;  Laterality: Left;  . POLYPECTOMY N/A 10/18/2012   Procedure: POLYPECTOMY;  Surgeon: Mora Bellman, MD;  Location: [facility];  Service: Gynecology;  Laterality: N/A;  . SYNOVECTOMY Left 02/10/2018   Procedure: SYNOVECTOMY;  Surgeon: Lovell Sheehan, MD;  Location: [facility];  Service: Orthopedics;  Laterality: Left;    Social History Martha Hamilton  reports that she has never smoked. She has never used smokeless tobacco. She reports that she does not drink alcohol or use drugs.  family history includes CAD (age of onset: 37) in her father; Diabetes in her mother; Esophageal cancer in her brother; Heart disease in her mother and sister; Hypertension in her mother; Kidney cancer in her mother; Stroke in her father.  Allergies  Allergen Reactions  . Cholestyramine Other (  See Comments)    REACTION: Muscles tightened up, drew up.  . Lipitor [Atorvastatin] Other (See Comments)    Muscle cramping  . Niacin Nausea And Vomiting  . Simvastatin     Muscular pain       PHYSICAL EXAMINATION: Vital signs: BP (!) 142/84 (BP Location: Left Arm, Patient Position: Sitting, Cuff Size: Normal)   Pulse 99   Temp 98.5 F (36.9 C) (Oral)   Ht 4' 11.5" (1.511 m)   Wt 152 lb 4 oz (69.1 kg)   LMP 06/13/2001   BMI 30.24 kg/m   Constitutional: generally well-appearing, no acute distress Psychiatric: alert  and oriented x3, cooperative Eyes: extraocular movements intact, anicteric, conjunctiva pink Mouth: oral pharynx moist, no lesions Neck: supple no lymphadenopathy Cardiovascular: heart regular rate and rhythm, no murmur Lungs: clear to auscultation bilaterally Abdomen: soft, nontender, nondistended, no obvious ascites, no peritoneal signs, normal bowel sounds, no organomegaly Rectal: Omitted Extremities: no clubbing, cyanosis, or lower extremity edema bilaterally Skin: no lesions on visible extremities Neuro: No focal deficits.  Cranial nerves intact  ASSESSMENT:  1.  GERD.  Status post prior fundoplication.  Recent breakthrough symptoms and epigastric discomfort improved after increased dose of PPI. 2.  Recurrent iron deficiency anemia.  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  Most recent EGD unremarkable.  Last colonoscopy 2014 was normal, including ileal intubation.  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  We have decided to forego capsule endoscopy at this time as she is otherwise feeling well 3.  General medical problems.  Stable   PLAN:  1.  Reflux precautions 2.  Continue omeprazole 40 mg twice daily 3.  Continue regular iron supplementation 4.  Zofran as needed for nausea 5.  Routine GI follow-up 1 year.  Contact the office in the interim for any questions or problems 25-minute spent face-to-face.  Greater than 50% of time used for counseling regarding her above listed GI issues

## 2019-03-22 ENCOUNTER — Other Ambulatory Visit: Payer: Self-pay | Admitting: Family Medicine

## 2019-03-22 DIAGNOSIS — Z1231 Encounter for screening mammogram for malignant neoplasm of breast: Secondary | ICD-10-CM

## 2019-04-13 ENCOUNTER — Ambulatory Visit (INDEPENDENT_AMBULATORY_CARE_PROVIDER_SITE_OTHER): Payer: Medicare Other | Admitting: Family Medicine

## 2019-04-13 ENCOUNTER — Encounter: Payer: Self-pay | Admitting: Family Medicine

## 2019-04-13 ENCOUNTER — Other Ambulatory Visit: Payer: Self-pay

## 2019-04-13 VITALS — BP 132/70 | HR 88 | Temp 98.0°F | Ht 59.5 in | Wt 154.1 lb

## 2019-04-13 DIAGNOSIS — R000 Tachycardia, unspecified: Secondary | ICD-10-CM | POA: Diagnosis not present

## 2019-04-13 DIAGNOSIS — I10 Essential (primary) hypertension: Secondary | ICD-10-CM

## 2019-04-13 DIAGNOSIS — E669 Obesity, unspecified: Secondary | ICD-10-CM | POA: Diagnosis not present

## 2019-04-13 DIAGNOSIS — E119 Type 2 diabetes mellitus without complications: Secondary | ICD-10-CM | POA: Diagnosis not present

## 2019-04-13 NOTE — Assessment & Plan Note (Signed)
Discussed how this problem influences overall health and the risks it imposes  Reviewed plan for weight loss with lower calorie diet (via better food choices and also portion control or program like weight watchers) and exercise building up to or more than 30 minutes 5 days per week including some aerobic activity    

## 2019-04-13 NOTE — Assessment & Plan Note (Signed)
Some improvement with low dose metoprolol xl  Will continue to follow

## 2019-04-13 NOTE — Progress Notes (Signed)
Subjective:    Patient ID: Martha Hamilton, female    DOB: 10-26-1950, 68 y.o.   MRN: [id number]  HPI Here for 3 mo f/u of chronic medical problems Feeling ok   Wt Readings from Last 3 Encounters:  04/13/19 154 lb 1 oz (69.9 kg)  03/18/19 152 lb 4 oz (69.1 kg)  02/08/19 153 lb (69.4 kg)   30.60 kg/m   bp is stable today  Improved on 2nd check 132/70    No cp or palpitations or headaches or edema  No side effects to medicines  BP Readings from Last 3 Encounters:  04/13/19 (!) 146/76  03/18/19 (!) 142/84  02/08/19 (!) 174/81    Losartan 50 Metoprolol xl 25 hctz 25 mg   Pulse Readings from Last 3 Encounters:  04/13/19 90  03/18/19 99  02/08/19 90   Last visit given metoprolol xl for pulse and bp  She tolerates it fine Does not notice she is on it -no side effects    DM2 Lab Results  Component Value Date   HGBA1C 6.6 (H) 01/10/2019  on metformin and glipizide Eating better  Eating vegetables from the garden Some fresh fruits  Avoids sweets/pasta /bread for the most part Now getting more exercise - helping with school (including PE)  Walking as well   Eye exam is due next month  Has is scheduled for 10/26  Patient Active Problem List   Diagnosis Date Noted  . Tachycardia 01/11/2019  . Fatty liver 06/21/2018  . Obesity (BMI 30-39.9) 06/21/2018  . Dyspepsia 05/25/2018  . GERD (gastroesophageal reflux disease) 05/25/2018  . H/O vertigo 05/11/2018  . Pre-op examination 01/22/2018  . Thrombocytosis ([place]) 12/25/2017  . Left knee pain 09/21/2017  . Low back pain 07/06/2017  . Welcome to Medicare preventive visit 12/17/2016  . Grief reaction 12/17/2016  . Estrogen deficiency 09/08/2016  . Muscle pain 02/08/2016  . Anemia 02/09/2013  . Abnormal EKG 07/27/2012  . Special screening for malignant neoplasms, colon 04/29/2011  . Gynecological examination 04/29/2011  . Routine general medical examination at a health care facility 04/20/2011  .  OSTEOARTHRITIS, HANDS, BILATERAL 10/31/2008  . Diabetes type 2, controlled ([place]) 07/10/2008  . Hyperlipidemia 01/26/2008  . Essential hypertension 01/26/2008  . ALLERGIC RHINITIS 12/07/2007   Past Medical History:  Diagnosis Date  . Allergic rhinitis   . Breast cancer ([place]) 1996   right breast  . DM2 (diabetes mellitus, type 2) ([place])   . GERD (gastroesophageal reflux disease)   . Hx of cardiovascular stress test    a. Lex MV 2/14:  EF 69%, no ischemia  . Hyperlipidemia   . Hypertension   . Iron deficiency anemia   . Obesity   . Osteoarthritis    hands  . Ulcer    Past Surgical History:  Procedure Laterality Date  . APPENDECTOMY  1976  . BREAST LUMPECTOMY Right 1996   right breast, lumpectomy w/nodes  . CHONDROPLASTY Left 02/10/2018   Procedure: CHONDROPLASTY;  Surgeon: Lovell Sheehan, MD;  Location: [facility];  Service: Orthopedics;  Laterality: Left;  Marland Kitchen GASTRIC RESTRICTION SURGERY     for reflux  . HYSTEROSCOPY W/D&C N/A 10/18/2012   Procedure: DILATATION AND CURETTAGE /HYSTEROSCOPY;  Surgeon: Mora Bellman, MD;  Location: [facility];  Service: Gynecology;  Laterality: N/A;  . KNEE ARTHROSCOPY WITH MEDIAL MENISECTOMY Left 02/10/2018   Procedure: KNEE ARTHROSCOPY WITH MEDIAL and LATERAL MENISECTOMY;  Surgeon: Lovell Sheehan, MD;  Location: [facility];  Service: Orthopedics;  Laterality:  Left;  . POLYPECTOMY N/A 10/18/2012   Procedure: POLYPECTOMY;  Surgeon: Mora Bellman, MD;  Location: [facility];  Service: Gynecology;  Laterality: N/A;  . SYNOVECTOMY Left 02/10/2018   Procedure: SYNOVECTOMY;  Surgeon: Lovell Sheehan, MD;  Location: [facility];  Service: Orthopedics;  Laterality: Left;   Social History   Tobacco Use  . Smoking status: Never Smoker  . Smokeless tobacco: Never Used  Substance Use Topics  . Alcohol use: Never    Alcohol/week: 0.0 standard drinks    Frequency: Never  . Drug use: No   Family History  Problem Relation Age of Onset  . Diabetes Mother   . Heart  disease Mother        Pacemaker, CHF  . Hypertension Mother   . Kidney cancer Mother   . Stroke Father   . CAD Father 39       Died with MI  . Esophageal cancer Brother   . Heart disease Sister   . Colon cancer Neg Hx   . Rectal cancer Neg Hx   . Stomach cancer Neg Hx   . Pancreatic cancer Neg Hx    Allergies  Allergen Reactions  . Cholestyramine Other (See Comments)    REACTION: Muscles tightened up, drew up.  . Lipitor [Atorvastatin] Other (See Comments)    Muscle cramping  . Niacin Nausea And Vomiting  . Simvastatin     Muscular pain   Current Outpatient Medications on File Prior to Visit  Medication Sig Dispense Refill  . acetaminophen (TYLENOL) 500 MG tablet Take 1,000 mg by mouth every 6 (six) hours as needed for mild pain.     Marland Kitchen albuterol (VENTOLIN HFA) 108 (90 Base) MCG/ACT inhaler Inhale 2 puffs into the lungs every 4 (four) hours as needed for wheezing. 8 g 5  . amitriptyline (ELAVIL) 25 MG tablet Take 1 tablet (25 mg total) by mouth at bedtime. 90 tablet 3  . aspirin 81 MG tablet Take 81 mg by mouth at bedtime.     . calcium elemental as carbonate (TUMS ULTRA 1000) 400 MG chewable tablet Chew 1,000 mg by mouth daily as needed for heartburn.    . Cholecalciferol (VITAMIN D) 2000 units tablet Take 2,000 Units by mouth daily.    . fenofibrate 54 MG tablet TAKE 1 TABLET(54 MG) BY MOUTH DAILY 90 tablet 3  . ferrous sulfate 325 (65 FE) MG tablet Take 1 tablet (325 mg total) by mouth 2 (two) times daily with a meal. 180 tablet 3  . glipiZIDE (GLUCOTROL XL) 2.5 MG 24 hr tablet TAKE 1 TABLET BY MOUTH IN  THE EVENING 90 tablet 3  . hydrochlorothiazide (HYDRODIURIL) 25 MG tablet TAKE 1 TABLET BY MOUTH  DAILY 90 tablet 3  . loratadine (CLARITIN) 10 MG tablet Take 10 mg by mouth daily as needed for allergies.    Marland Kitchen losartan (COZAAR) 50 MG tablet Take 1 tablet (50 mg total) by mouth daily. 90 tablet 3  . meclizine (ANTIVERT) 25 MG tablet Take 1 tablet (25 mg total) by mouth 3  (three) times daily as needed for dizziness. 30 tablet 3  . metFORMIN (GLUCOPHAGE) 1000 MG tablet TAKE 1 TABLET BY MOUTH TWO  TIMES DAILY WITH A MEAL 180 tablet 3  . metoprolol succinate (TOPROL-XL) 25 MG 24 hr tablet Take 1 tablet (25 mg total) by mouth daily. 90 tablet 3  . omeprazole (PRILOSEC) 40 MG capsule Take 1 capsule (40 mg total) by mouth 2 (two) times a day. 180 capsule  3  . ondansetron (ZOFRAN) 4 MG tablet Take 1 tablet (4 mg total) by mouth every 4 (four) hours as needed for nausea or vomiting. 90 tablet 2  . ONETOUCH DELICA LANCETS 99991111 MISC Check blood sugar once  daily and as directed. Dx  E11.9 100 each 3  . ONETOUCH VERIO test strip TEST BLOOD SUGAR ONCE DAILY FOR DIABETES MELLITUS AS AS NEEDED. 100 each 3  . SALINE NASAL SPRAY NA Place 1 spray into the nose daily as needed (congestion).      No current facility-administered medications on file prior to visit.      Review of Systems  Constitutional: Negative for activity change, appetite change, fatigue, fever and unexpected weight change.  HENT: Negative for congestion, ear pain, rhinorrhea, sinus pressure and sore throat.   Eyes: Negative for pain, redness and visual disturbance.  Respiratory: Negative for cough, shortness of breath and wheezing.   Cardiovascular: Negative for chest pain and palpitations.  Gastrointestinal: Negative for abdominal pain, blood in stool, constipation and diarrhea.  Endocrine: Negative for polydipsia and polyuria.  Genitourinary: Negative for dysuria, frequency and urgency.  Musculoskeletal: Negative for arthralgias, back pain and myalgias.  Skin: Negative for pallor and rash.  Allergic/Immunologic: Negative for environmental allergies.  Neurological: Negative for dizziness, syncope and headaches.  Hematological: Negative for adenopathy. Does not bruise/bleed easily.  Psychiatric/Behavioral: Negative for decreased concentration and dysphoric mood. The patient is not nervous/anxious.         Objective:   Physical Exam Constitutional:      General: She is not in acute distress.    Appearance: Normal appearance. She is well-developed. She is obese. She is not ill-appearing.  HENT:     Head: Normocephalic and atraumatic.  Eyes:     Conjunctiva/sclera: Conjunctivae normal.     Pupils: Pupils are equal, round, and reactive to light.  Neck:     Musculoskeletal: Normal range of motion and neck supple.     Thyroid: No thyromegaly.     Vascular: No carotid bruit or JVD.  Cardiovascular:     Rate and Rhythm: Normal rate and regular rhythm.     Heart sounds: Normal heart sounds. No gallop.   Pulmonary:     Effort: Pulmonary effort is normal. No respiratory distress.     Breath sounds: Normal breath sounds. No wheezing or rales.  Abdominal:     General: Bowel sounds are normal. There is no distension or abdominal bruit.     Palpations: Abdomen is soft. There is no mass.     Tenderness: There is no abdominal tenderness.  Musculoskeletal:     Right lower leg: No edema.     Left lower leg: No edema.  Lymphadenopathy:     Cervical: No cervical adenopathy.  Skin:    General: Skin is warm and dry.     Coloration: Skin is not pale.     Findings: No erythema or rash.  Neurological:     Mental Status: She is alert. Mental status is at baseline.     Deep Tendon Reflexes: Reflexes are normal and symmetric. Reflexes normal.  Psychiatric:        Mood and Affect: Mood normal.           Assessment & Plan:   Problem List Items Addressed This Visit      Cardiovascular and Mediastinum   Essential hypertension - Primary    bp in fair control at this time  BP Readings from Last 1 Encounters:  04/13/19  132/70   No changes needed Most recent labs reviewed  Disc lifstyle change with low sodium diet and exercise  Continue metoprolol which has helped bp and pulse          Endocrine   Diabetes type 2, controlled ([place])    Lab Results  Component Value Date   HGBA1C 6.6 (H)  01/10/2019   This is well controlled but was up  Long discussion re: low glycemic diet and exercise  Will get eye exam next month         Other   Obesity (BMI 30-39.9)    Discussed how this problem influences overall health and the risks it imposes  Reviewed plan for weight loss with lower calorie diet (via better food choices and also portion control or program like weight watchers) and exercise building up to or more than 30 minutes 5 days per week including some aerobic activity         Tachycardia    Some improvement with low dose metoprolol xl  Will continue to follow

## 2019-04-13 NOTE — Assessment & Plan Note (Signed)
bp in fair control at this time  BP Readings from Last 1 Encounters:  04/13/19 132/70   No changes needed Most recent labs reviewed  Disc lifstyle change with low sodium diet and exercise  Continue metoprolol which has helped bp and pulse

## 2019-04-13 NOTE — Patient Instructions (Signed)
Take care of yourself  Stay active  Start walking  Keep eating a healthy diet   Try to get most of your carbohydrates from produce (with the exception of white potatoes)  Eat less bread/pasta/rice/snack foods/cereals/sweets and other items from the middle of the grocery store (processed carbs)   Pulse and blood pressure are improved We will continue to watch this

## 2019-04-13 NOTE — Assessment & Plan Note (Signed)
Lab Results  Component Value Date   HGBA1C 6.6 (H) 01/10/2019   This is well controlled but was up  Long discussion re: low glycemic diet and exercise  Will get eye exam next month

## 2019-05-05 ENCOUNTER — Other Ambulatory Visit: Payer: Self-pay

## 2019-05-05 ENCOUNTER — Ambulatory Visit
Admission: RE | Admit: 2019-05-05 | Discharge: 2019-05-05 | Disposition: A | Discharge status: Home / Self Care | Payer: Medicare Other | Source: Ambulatory Visit | Attending: Family Medicine | Admitting: Family Medicine

## 2019-05-05 DIAGNOSIS — Z1231 Encounter for screening mammogram for malignant neoplasm of breast: Secondary | ICD-10-CM

## 2019-05-09 LAB — HM DIABETES EYE EXAM

## 2019-05-25 ENCOUNTER — Encounter: Payer: Self-pay | Admitting: Family Medicine

## 2019-06-06 ENCOUNTER — Other Ambulatory Visit: Payer: Self-pay | Admitting: Family Medicine

## 2019-06-11 ENCOUNTER — Other Ambulatory Visit: Payer: Self-pay | Admitting: Family Medicine

## 2019-06-17 ENCOUNTER — Other Ambulatory Visit (INDEPENDENT_AMBULATORY_CARE_PROVIDER_SITE_OTHER): Payer: Medicare Other

## 2019-06-17 DIAGNOSIS — D500 Iron deficiency anemia secondary to blood loss (chronic): Secondary | ICD-10-CM

## 2019-06-17 LAB — CBC WITH DIFFERENTIAL/PLATELET
Basophils Absolute: 0.1 10*3/uL (ref 0.0–0.1)
Basophils Relative: 1.2 % (ref 0.0–3.0)
Eosinophils Absolute: 0.3 10*3/uL (ref 0.0–0.7)
Eosinophils Relative: 4.9 % (ref 0.0–5.0)
HCT: 32.7 % — ABNORMAL LOW (ref 36.0–46.0)
Hemoglobin: 10.8 g/dL — ABNORMAL LOW (ref 12.0–15.0)
Lymphocytes Relative: 31.2 % (ref 12.0–46.0)
Lymphs Abs: 1.9 10*3/uL (ref 0.7–4.0)
MCHC: 33 g/dL (ref 30.0–36.0)
MCV: 95 fl (ref 78.0–100.0)
Monocytes Absolute: 0.3 10*3/uL (ref 0.1–1.0)
Monocytes Relative: 5.4 % (ref 3.0–12.0)
Neutro Abs: 3.5 10*3/uL (ref 1.4–7.7)
Neutrophils Relative %: 57.3 % (ref 43.0–77.0)
Platelets: 475 10*3/uL — ABNORMAL HIGH (ref 150.0–400.0)
RBC: 3.44 Mil/uL — ABNORMAL LOW (ref 3.87–5.11)
RDW: 15.5 % (ref 11.5–15.5)
WBC: 6 10*3/uL (ref 4.0–10.5)

## 2019-06-17 LAB — FERRITIN: Ferritin: 10.5 ng/mL (ref 10.0–291.0)

## 2019-06-20 ENCOUNTER — Other Ambulatory Visit: Payer: Self-pay

## 2019-06-20 DIAGNOSIS — D500 Iron deficiency anemia secondary to blood loss (chronic): Secondary | ICD-10-CM

## 2019-06-22 ENCOUNTER — Other Ambulatory Visit: Payer: Self-pay | Admitting: Family Medicine

## 2019-07-10 ENCOUNTER — Telehealth: Payer: Self-pay | Admitting: Family Medicine

## 2019-07-10 DIAGNOSIS — I10 Essential (primary) hypertension: Secondary | ICD-10-CM

## 2019-07-10 DIAGNOSIS — E782 Mixed hyperlipidemia: Secondary | ICD-10-CM

## 2019-07-10 DIAGNOSIS — E119 Type 2 diabetes mellitus without complications: Secondary | ICD-10-CM

## 2019-07-10 NOTE — Telephone Encounter (Signed)
-----   Message from Ellamae Sia sent at 06/29/2019 10:33 AM EST ----- Regarding: lab orders for Monday, 12.28.20 Lab orders for a 3 month follow up appt.

## 2019-07-11 ENCOUNTER — Other Ambulatory Visit: Payer: Self-pay

## 2019-07-11 ENCOUNTER — Other Ambulatory Visit (INDEPENDENT_AMBULATORY_CARE_PROVIDER_SITE_OTHER): Payer: Medicare Other

## 2019-07-11 DIAGNOSIS — E119 Type 2 diabetes mellitus without complications: Secondary | ICD-10-CM

## 2019-07-11 DIAGNOSIS — I10 Essential (primary) hypertension: Secondary | ICD-10-CM

## 2019-07-11 DIAGNOSIS — E782 Mixed hyperlipidemia: Secondary | ICD-10-CM | POA: Diagnosis not present

## 2019-07-11 LAB — LIPID PANEL
Cholesterol: 190 mg/dL (ref 0–200)
HDL: 39.5 mg/dL (ref >39.00)
NonHDL: 150.82
Total CHOL/HDL Ratio: 5
Triglycerides: 381 mg/dL — ABNORMAL HIGH (ref 0.0–149.0)
VLDL: 76.2 mg/dL — ABNORMAL HIGH (ref 0.0–40.0)

## 2019-07-11 LAB — COMPREHENSIVE METABOLIC PANEL
ALT: 16 U/L (ref 0–35)
AST: 15 U/L (ref 0–37)
Albumin: 4.2 g/dL (ref 3.5–5.2)
Alkaline Phosphatase: 38 U/L — ABNORMAL LOW (ref 39–117)
BUN: 26 mg/dL — ABNORMAL HIGH (ref 6–23)
CO2: 28 mEq/L (ref 19–32)
Calcium: 9.7 mg/dL (ref 8.4–10.5)
Chloride: 100 mEq/L (ref 96–112)
Creatinine, Ser: 1.05 mg/dL (ref 0.40–1.20)
GFR: 52.05 mL/min — ABNORMAL LOW (ref >60.00)
Glucose, Bld: 131 mg/dL — ABNORMAL HIGH (ref 70–99)
Potassium: 4.2 mEq/L (ref 3.5–5.1)
Sodium: 137 mEq/L (ref 135–145)
Total Bilirubin: 0.2 mg/dL (ref 0.2–1.2)
Total Protein: 7.5 g/dL (ref 6.0–8.3)

## 2019-07-11 LAB — LDL CHOLESTEROL, DIRECT: Direct LDL: 100 mg/dL

## 2019-07-11 LAB — HEMOGLOBIN A1C: Hgb A1c MFr Bld: 5.9 % (ref 4.6–6.5)

## 2019-07-13 ENCOUNTER — Encounter: Payer: Self-pay | Admitting: Family Medicine

## 2019-07-13 ENCOUNTER — Other Ambulatory Visit: Payer: Self-pay

## 2019-07-13 ENCOUNTER — Ambulatory Visit (INDEPENDENT_AMBULATORY_CARE_PROVIDER_SITE_OTHER): Payer: Medicare Other | Admitting: Family Medicine

## 2019-07-13 VITALS — BP 136/80 | HR 99 | Temp 97.0°F | Ht 59.5 in | Wt 155.1 lb

## 2019-07-13 DIAGNOSIS — R000 Tachycardia, unspecified: Secondary | ICD-10-CM | POA: Diagnosis not present

## 2019-07-13 DIAGNOSIS — I10 Essential (primary) hypertension: Secondary | ICD-10-CM | POA: Diagnosis not present

## 2019-07-13 DIAGNOSIS — E119 Type 2 diabetes mellitus without complications: Secondary | ICD-10-CM | POA: Diagnosis not present

## 2019-07-13 DIAGNOSIS — E785 Hyperlipidemia, unspecified: Secondary | ICD-10-CM

## 2019-07-13 DIAGNOSIS — E1169 Type 2 diabetes mellitus with other specified complication: Secondary | ICD-10-CM | POA: Diagnosis not present

## 2019-07-13 NOTE — Patient Instructions (Addendum)
Check out some chair yoga videos - it is very helpful  Take care of yourself  Keep watching diet  Diabetes is better  Stay active

## 2019-07-13 NOTE — Assessment & Plan Note (Signed)
Pt continues low dose metoprolol  Pulse is better at home than in the office  She will continue to monitor

## 2019-07-13 NOTE — Assessment & Plan Note (Signed)
bp in fair control at this time  BP Readings from Last 1 Encounters:  07/13/19 136/80   No changes needed Most recent labs reviewed  Disc lifstyle change with low sodium diet and exercise

## 2019-07-13 NOTE — Assessment & Plan Note (Signed)
Lab Results  Component Value Date   HGBA1C 5.9 07/11/2019   Better control Enc her to continue low glycemic diet  Intolerant of statins  Taking arb  Eye exam utd

## 2019-07-13 NOTE — Assessment & Plan Note (Signed)
Disc goals for lipids and reasons to control them Rev last labs with pt Rev low sat fat diet in detail LDL of 100  Should be on statin due to DM but pt does not tolerate them  Continues fibrate for triglycerides

## 2019-07-13 NOTE — Progress Notes (Signed)
Subjective:    Patient ID: Martha Hamilton, female    DOB: 26-Apr-1951, 68 y.o.   MRN: [id number]  This visit occurred during the SARS-CoV-2 public health emergency.  Safety protocols were in place, including screening questions prior to the visit, additional usage of staff PPE, and extensive cleaning of exam room while observing appropriate contact time as indicated for disinfecting solutions.    HPI Here for f/u of chronic health problems  Feeling great   Wt Readings from Last 3 Encounters:  07/13/19 155 lb 2 oz (70.4 kg)  04/13/19 154 lb 1 oz (69.9 kg)  03/18/19 152 lb 4 oz (69.1 kg)  weight is stable  Eating healthy  Exercise- some calisthenics and stretches  30.81 kg/m    bp is stable today  No cp or palpitations or headaches or edema  No side effects to medicines  BP Readings from Last 3 Encounters:  07/13/19 (!) 142/78  04/13/19 132/70  03/18/19 (!) 142/84     Pulse Readings from Last 3 Encounters:  07/13/19 99  04/13/19 88  03/18/19 99  takes low dose metoprolol xl for tachycardia and bp  Mid day her bp at home 130s/70s Pulse is better- does feel better /no palpitations     Lab Results  Component Value Date   CREATININE 1.05 07/11/2019   BUN 26 (H) 07/11/2019   NA 137 07/11/2019   K 4.2 07/11/2019   CL 100 07/11/2019   CO2 28 07/11/2019    DM2 Lab Results  Component Value Date   HGBA1C 5.9 07/11/2019  sugar control is better  Is eating better - fixing her own food /not eating out  Avoids sugar and sweets  This is down from 6.6  Eye exam 10/20 Takes arb Intolerant of statins -will not take   Hyperlipidemia Lab Results  Component Value Date   CHOL 190 07/11/2019   CHOL 184 01/10/2019   CHOL 187 01/19/2018   Lab Results  Component Value Date   HDL 39.50 07/11/2019   HDL 38.70 (L) 01/10/2019   HDL 46.80 01/19/2018   Lab Results  Component Value Date   LDLCALC 51 10/21/2011   LDLCALC 48 04/29/2010   Lab Results  Component Value  Date   TRIG 381.0 (H) 07/11/2019   TRIG (H) 01/10/2019    421.0 Triglyceride is over 400; calculations on Lipids are invalid.   TRIG 301.0 (H) 01/19/2018   Lab Results  Component Value Date   CHOLHDL 5 07/11/2019   CHOLHDL 5 01/10/2019   CHOLHDL 4 01/19/2018   Lab Results  Component Value Date   LDLDIRECT 100.0 07/11/2019   LDLDIRECT 99.0 01/10/2019   LDLDIRECT 107.0 01/19/2018  intolerant of statins Takes fenofibrate Hereditary high cholesterol  LDL is 100    Patient Active Problem List   Diagnosis Date Noted  . Tachycardia 01/11/2019  . Fatty liver 06/21/2018  . Obesity (BMI 30-39.9) 06/21/2018  . Dyspepsia 05/25/2018  . GERD (gastroesophageal reflux disease) 05/25/2018  . H/O vertigo 05/11/2018  . Pre-op examination 01/22/2018  . Thrombocytosis ([place]) 12/25/2017  . Left knee pain 09/21/2017  . Low back pain 07/06/2017  . Welcome to Medicare preventive visit 12/17/2016  . Grief reaction 12/17/2016  . Estrogen deficiency 09/08/2016  . Muscle pain 02/08/2016  . Anemia 02/09/2013  . Abnormal EKG 07/27/2012  . Special screening for malignant neoplasms, colon 04/29/2011  . Gynecological examination 04/29/2011  . Routine general medical examination at a health care facility 04/20/2011  . OSTEOARTHRITIS, HANDS,  BILATERAL 10/31/2008  . Diabetes type 2, controlled ([place]) 07/10/2008  . Hyperlipidemia associated with type 2 diabetes mellitus ([place]) 01/26/2008  . Essential hypertension 01/26/2008  . ALLERGIC RHINITIS 12/07/2007   Past Medical History:  Diagnosis Date  . Allergic rhinitis   . Breast cancer ([place]) 1996   right breast  . DM2 (diabetes mellitus, type 2) ([place])   . GERD (gastroesophageal reflux disease)   . Hx of cardiovascular stress test    a. Lex MV 2/14:  EF 69%, no ischemia  . Hyperlipidemia   . Hypertension   . Iron deficiency anemia   . Obesity   . Osteoarthritis    hands  . Ulcer    Past Surgical History:  Procedure Laterality Date  .  APPENDECTOMY  1976  . BREAST LUMPECTOMY Right 1996   right breast, lumpectomy w/nodes  . CHONDROPLASTY Left 02/10/2018   Procedure: CHONDROPLASTY;  Surgeon: Lovell Sheehan, MD;  Location: [facility];  Service: Orthopedics;  Laterality: Left;  Marland Kitchen GASTRIC RESTRICTION SURGERY     for reflux  . HYSTEROSCOPY WITH D & C N/A 10/18/2012   Procedure: DILATATION AND CURETTAGE /HYSTEROSCOPY;  Surgeon: Mora Bellman, MD;  Location: [facility];  Service: Gynecology;  Laterality: N/A;  . KNEE ARTHROSCOPY WITH MEDIAL MENISECTOMY Left 02/10/2018   Procedure: KNEE ARTHROSCOPY WITH MEDIAL and LATERAL MENISECTOMY;  Surgeon: Lovell Sheehan, MD;  Location: [facility];  Service: Orthopedics;  Laterality: Left;  . POLYPECTOMY N/A 10/18/2012   Procedure: POLYPECTOMY;  Surgeon: Mora Bellman, MD;  Location: [facility];  Service: Gynecology;  Laterality: N/A;  . SYNOVECTOMY Left 02/10/2018   Procedure: SYNOVECTOMY;  Surgeon: Lovell Sheehan, MD;  Location: [facility];  Service: Orthopedics;  Laterality: Left;   Social History   Tobacco Use  . Smoking status: Never Smoker  . Smokeless tobacco: Never Used  Substance Use Topics  . Alcohol use: Never    Alcohol/week: 0.0 standard drinks  . Drug use: No   Family History  Problem Relation Age of Onset  . Diabetes Mother   . Heart disease Mother        Pacemaker, CHF  . Hypertension Mother   . Kidney cancer Mother   . Stroke Father   . CAD Father 61       Died with MI  . Esophageal cancer Brother   . Heart disease Sister   . Colon cancer Neg Hx   . Rectal cancer Neg Hx   . Stomach cancer Neg Hx   . Pancreatic cancer Neg Hx    Allergies  Allergen Reactions  . Cholestyramine Other (See Comments)    REACTION: Muscles tightened up, drew up.  . Lipitor [Atorvastatin] Other (See Comments)    Muscle cramping  . Niacin Nausea And Vomiting  . Simvastatin     Muscular pain   Current Outpatient Medications on File Prior to Visit  Medication Sig Dispense Refill  .  acetaminophen (TYLENOL) 500 MG tablet Take 1,000 mg by mouth every 6 (six) hours as needed for mild pain.     Marland Kitchen albuterol (VENTOLIN HFA) 108 (90 Base) MCG/ACT inhaler Inhale 2 puffs into the lungs every 4 (four) hours as needed for wheezing. 8 g 5  . amitriptyline (ELAVIL) 25 MG tablet Take 1 tablet (25 mg total) by mouth at bedtime. 90 tablet 3  . aspirin 81 MG tablet Take 81 mg by mouth at bedtime.     . calcium elemental as carbonate (TUMS ULTRA 1000) 400 MG chewable tablet  Chew 1,000 mg by mouth daily as needed for heartburn.    . Cholecalciferol (VITAMIN D) 2000 units tablet Take 2,000 Units by mouth daily.    . fenofibrate 54 MG tablet TAKE 1 TABLET(54 MG) BY MOUTH DAILY 30 tablet 0  . ferrous sulfate 325 (65 FE) MG tablet Take 1 tablet (325 mg total) by mouth 2 (two) times daily with a meal. 180 tablet 3  . glipiZIDE (GLUCOTROL XL) 2.5 MG 24 hr tablet TAKE 1 TABLET BY MOUTH IN  THE EVENING 90 tablet 3  . hydrochlorothiazide (HYDRODIURIL) 25 MG tablet TAKE 1 TABLET BY MOUTH  DAILY 90 tablet 3  . loratadine (CLARITIN) 10 MG tablet Take 10 mg by mouth daily as needed for allergies.    Marland Kitchen losartan (COZAAR) 50 MG tablet Take 1 tablet (50 mg total) by mouth daily. 90 tablet 3  . meclizine (ANTIVERT) 25 MG tablet Take 1 tablet (25 mg total) by mouth 3 (three) times daily as needed for dizziness. 30 tablet 3  . metFORMIN (GLUCOPHAGE) 1000 MG tablet TAKE 1 TABLET BY MOUTH TWO  TIMES DAILY WITH A MEAL 180 tablet 3  . metoprolol succinate (TOPROL-XL) 25 MG 24 hr tablet Take 1 tablet (25 mg total) by mouth daily. 90 tablet 3  . omeprazole (PRILOSEC) 40 MG capsule Take 1 capsule (40 mg total) by mouth 2 (two) times a day. 180 capsule 3  . ondansetron (ZOFRAN) 4 MG tablet Take 1 tablet (4 mg total) by mouth every 4 (four) hours as needed for nausea or vomiting. 90 tablet 2  . ONETOUCH DELICA LANCETS 99991111 MISC Check blood sugar once  daily and as directed. Dx  E11.9 100 each 3  . ONETOUCH VERIO test strip  TEST BLOOD SUGAR ONCE DAILY FOR DIABETES MELLITUS AS AS NEEDED. 100 each 3  . SALINE NASAL SPRAY NA Place 1 spray into the nose daily as needed (congestion).      No current facility-administered medications on file prior to visit.    Review of Systems  Constitutional: Negative for activity change, appetite change, fatigue, fever and unexpected weight change.  HENT: Negative for congestion, ear pain, rhinorrhea, sinus pressure and sore throat.   Eyes: Negative for pain, redness and visual disturbance.  Respiratory: Negative for cough, shortness of breath and wheezing.   Cardiovascular: Negative for chest pain and palpitations.  Gastrointestinal: Negative for abdominal pain, blood in stool, constipation and diarrhea.  Endocrine: Negative for polydipsia and polyuria.  Genitourinary: Negative for dysuria, frequency and urgency.  Musculoskeletal: Negative for arthralgias, back pain and myalgias.  Skin: Negative for pallor and rash.  Allergic/Immunologic: Negative for environmental allergies.  Neurological: Negative for dizziness, syncope and headaches.  Hematological: Negative for adenopathy. Does not bruise/bleed easily.  Psychiatric/Behavioral: Negative for decreased concentration and dysphoric mood. The patient is not nervous/anxious.        Objective:   Physical Exam Constitutional:      General: She is not in acute distress.    Appearance: Normal appearance. She is well-developed. She is obese.  HENT:     Head: Normocephalic and atraumatic.  Eyes:     Conjunctiva/sclera: Conjunctivae normal.     Pupils: Pupils are equal, round, and reactive to light.  Neck:     Thyroid: No thyromegaly.     Vascular: No carotid bruit or JVD.  Cardiovascular:     Rate and Rhythm: Regular rhythm. Tachycardia present.     Heart sounds: Normal heart sounds. No gallop.   Pulmonary:  Effort: Pulmonary effort is normal. No respiratory distress.     Breath sounds: Normal breath sounds. No wheezing  or rales.  Abdominal:     General: Bowel sounds are normal. There is no distension or abdominal bruit.     Palpations: Abdomen is soft. There is no mass.     Tenderness: There is no abdominal tenderness.  Musculoskeletal:     Cervical back: Normal range of motion and neck supple.     Right lower leg: No edema.     Left lower leg: No edema.  Lymphadenopathy:     Cervical: No cervical adenopathy.  Skin:    General: Skin is warm and dry.     Findings: No rash.  Neurological:     Mental Status: She is alert.     Deep Tendon Reflexes: Reflexes are normal and symmetric.           Assessment & Plan:   Problem List Items Addressed This Visit      Cardiovascular and Mediastinum   Essential hypertension    bp in fair control at this time  BP Readings from Last 1 Encounters:  07/13/19 136/80   No changes needed Most recent labs reviewed  Disc lifstyle change with low sodium diet and exercise          Endocrine   Diabetes type 2, controlled ([place]) - Primary    Lab Results  Component Value Date   HGBA1C 5.9 07/11/2019   Better control Enc her to continue low glycemic diet  Intolerant of statins  Taking arb  Eye exam utd       Hyperlipidemia associated with type 2 diabetes mellitus ([place])    Disc goals for lipids and reasons to control them Rev last labs with pt Rev low sat fat diet in detail LDL of 100  Should be on statin due to DM but pt does not tolerate them  Continues fibrate for triglycerides         Other   Tachycardia    Pt continues low dose metoprolol  Pulse is better at home than in the office  She will continue to monitor

## 2019-07-16 ENCOUNTER — Encounter: Payer: Self-pay | Admitting: Family Medicine

## 2019-07-19 ENCOUNTER — Ambulatory Visit: Payer: Medicare HMO | Admitting: Podiatry

## 2019-07-19 ENCOUNTER — Other Ambulatory Visit: Payer: Self-pay

## 2019-07-19 ENCOUNTER — Other Ambulatory Visit: Payer: Self-pay | Admitting: *Deleted

## 2019-07-19 ENCOUNTER — Ambulatory Visit (INDEPENDENT_AMBULATORY_CARE_PROVIDER_SITE_OTHER): Payer: Medicare PPO

## 2019-07-19 ENCOUNTER — Other Ambulatory Visit: Payer: Self-pay | Admitting: Podiatry

## 2019-07-19 ENCOUNTER — Encounter: Payer: Self-pay | Admitting: Podiatry

## 2019-07-19 DIAGNOSIS — S99921A Unspecified injury of right foot, initial encounter: Secondary | ICD-10-CM

## 2019-07-19 DIAGNOSIS — S93601A Unspecified sprain of right foot, initial encounter: Secondary | ICD-10-CM

## 2019-07-19 MED ORDER — ACCU-CHEK SOFTCLIX LANCETS MISC: 3 refills | Status: DC

## 2019-07-19 MED ORDER — HYDROCHLOROTHIAZIDE 25 MG PO TABS: 25.0000 mg | ORAL_TABLET | Freq: Every day | ORAL | 3 refills | Status: DC

## 2019-07-19 MED ORDER — GLIPIZIDE ER 2.5 MG PO TB24: 2.5000 mg | ORAL_TABLET | Freq: Every evening | ORAL | 3 refills | Status: DC

## 2019-07-19 MED ORDER — BD SWAB SINGLE USE REGULAR PADS: MEDICATED_PAD | 3 refills | Status: DC

## 2019-07-19 MED ORDER — AMITRIPTYLINE HCL 25 MG PO TABS: 25.0000 mg | ORAL_TABLET | Freq: Every day | ORAL | 3 refills | Status: DC

## 2019-07-19 MED ORDER — MELOXICAM 15 MG PO TABS: 15.0000 mg | ORAL_TABLET | Freq: Every day | ORAL | 1 refills | Status: DC

## 2019-07-19 MED ORDER — ACCU-CHEK AVIVA PLUS W/DEVICE KIT: PACK | 0 refills | Status: DC

## 2019-07-19 MED ORDER — MECLIZINE HCL 25 MG PO TABS: 25.0000 mg | ORAL_TABLET | Freq: Three times a day (TID) | ORAL | 3 refills | Status: AC | PRN

## 2019-07-19 MED ORDER — METOPROLOL SUCCINATE ER 25 MG PO TB24: 25.0000 mg | ORAL_TABLET | Freq: Every day | ORAL | 3 refills | Status: DC

## 2019-07-19 MED ORDER — ACCU-CHEK AVIVA VI SOLN: 3 refills | Status: DC

## 2019-07-19 MED ORDER — FENOFIBRATE 54 MG PO TABS: 54.0000 mg | ORAL_TABLET | Freq: Every day | ORAL | 3 refills | Status: DC

## 2019-07-19 MED ORDER — METFORMIN HCL 1000 MG PO TABS: ORAL_TABLET | ORAL | 3 refills | Status: DC

## 2019-07-19 MED ORDER — ACCU-CHEK AVIVA PLUS VI STRP: ORAL_STRIP | 3 refills | Status: DC

## 2019-07-19 MED ORDER — LOSARTAN POTASSIUM 50 MG PO TABS: 50.0000 mg | ORAL_TABLET | Freq: Every day | ORAL | 3 refills | Status: DC

## 2019-07-19 MED ORDER — ONDANSETRON HCL 4 MG PO TABS: 4.0000 mg | ORAL_TABLET | ORAL | 2 refills | Status: DC | PRN

## 2019-07-19 NOTE — Telephone Encounter (Signed)
Pt has new insurance and needs Rxs sent to [facility] order pharmacy (90 day supplies) due to the meclizine and zofran I have to route to PCP for approval. CPE scheduled 01/19/20

## 2019-07-22 ENCOUNTER — Telehealth: Payer: Self-pay

## 2019-07-22 NOTE — Telephone Encounter (Signed)
Christa with [facility] pharmacy wanted to ck on omeprazole 40 mg; advised Christa to contact Dr Scarlette Shorts at [phone].

## 2019-07-22 NOTE — Progress Notes (Signed)
   HPI: 69 y.o. female presenting today as new patient with a chief complaint of cramping pain of the right foot that began three days ago secondary to an injury. She states she stepped in a hole in the front yard when the pain began. She notes the pain on the dorsolateral aspect of the foot. She has been using ice therapy, applying Aspercreme and taking Tylenol with no significant relief. Walking increases the pain. Patient is here for further evaluation and treatment.   Past Medical History:  Diagnosis Date  . Allergic rhinitis   . Breast cancer ([place]) 1996   right breast  . DM2 (diabetes mellitus, type 2) ([place])   . GERD (gastroesophageal reflux disease)   . Hx of cardiovascular stress test    a. Lex MV 2/14:  EF 69%, no ischemia  . Hyperlipidemia   . Hypertension   . Iron deficiency anemia   . Obesity   . Osteoarthritis    hands  . Ulcer      Physical Exam: General: The patient is alert and oriented x3 in no acute distress.  Dermatology: Skin is warm, dry and supple bilateral lower extremities. Negative for open lesions or macerations.  Vascular: Palpable pedal pulses bilaterally. No edema or erythema noted. Capillary refill within normal limits.  Neurological: Epicritic and protective threshold grossly intact bilaterally.   Musculoskeletal Exam: Pain with palpation noted to the right foot. Range of motion within normal limits to all pedal and ankle joints bilateral. Muscle strength 5/5 in all groups bilateral.   Radiographic Exam:  Normal osseous mineralization. Joint spaces preserved. No fracture/dislocation/boney destruction.    Assessment: 1. Right foot sprain - initial encounter    Plan of Care:  1. Patient evaluated. X-Rays reviewed.  2. Ace wrap applied.  3. CAM boot dispensed. Weightbearing as tolerated.  4. Prescription for Meloxicam provided to patient. 5. Return to clinic in 4 weeks for follow up X-Ray.       Edrick Kins, DPM [facility]  Dr. Edrick Kins, DPM    [address], [place] [address]                Office [phone]  Fax [phone]

## 2019-07-26 ENCOUNTER — Telehealth: Payer: Self-pay | Admitting: Family Medicine

## 2019-07-26 NOTE — Telephone Encounter (Signed)
Please have them send another one Thanks

## 2019-07-26 NOTE — Telephone Encounter (Signed)
Fax placed in your inbox

## 2019-07-26 NOTE — Telephone Encounter (Signed)
I placed that form in Dr. Marliss Coots inbox yesterday day to review. Dr. Glori Bickers please review form and advise on the form so I can fax it back

## 2019-07-26 NOTE — Telephone Encounter (Signed)
Done and in IN box 

## 2019-07-26 NOTE — Telephone Encounter (Signed)
[facility] pharmacy called in regards to a prescription they have received for the patient  They wanted to verify the medication and the interaction between  Meloxicam and Amitriptyline   Call back # [phone]

## 2019-07-26 NOTE — Telephone Encounter (Signed)
Form faxed

## 2019-08-01 ENCOUNTER — Other Ambulatory Visit: Payer: Self-pay

## 2019-08-01 MED ORDER — OMEPRAZOLE 40 MG PO CPDR: 40.0000 mg | DELAYED_RELEASE_CAPSULE | Freq: Every day | ORAL | 1 refills | Status: DC

## 2019-08-16 ENCOUNTER — Ambulatory Visit: Payer: Medicare PPO | Admitting: Podiatry

## 2019-08-17 ENCOUNTER — Encounter: Payer: Self-pay | Admitting: Family Medicine

## 2019-08-18 ENCOUNTER — Telehealth: Payer: Self-pay | Admitting: Internal Medicine

## 2019-08-22 NOTE — Telephone Encounter (Signed)
Faxed correct prescription to [facility].  Patient aware.

## 2019-09-15 ENCOUNTER — Telehealth: Payer: Self-pay

## 2019-09-15 MED ORDER — OMEPRAZOLE 40 MG PO CPDR: 40.0000 mg | DELAYED_RELEASE_CAPSULE | Freq: Two times a day (BID) | ORAL | 1 refills | Status: DC

## 2019-09-15 NOTE — Telephone Encounter (Signed)
Resent Omeprazole reflecting twice a day

## 2019-09-22 ENCOUNTER — Other Ambulatory Visit (INDEPENDENT_AMBULATORY_CARE_PROVIDER_SITE_OTHER): Payer: Medicare PPO

## 2019-09-22 ENCOUNTER — Other Ambulatory Visit: Payer: Self-pay

## 2019-09-22 DIAGNOSIS — D500 Iron deficiency anemia secondary to blood loss (chronic): Secondary | ICD-10-CM

## 2019-09-22 LAB — CBC WITH DIFFERENTIAL/PLATELET
Basophils Absolute: 0.1 10*3/uL (ref 0.0–0.1)
Basophils Relative: 1 % (ref 0.0–3.0)
Eosinophils Absolute: 0.4 10*3/uL (ref 0.0–0.7)
Eosinophils Relative: 5.9 % — ABNORMAL HIGH (ref 0.0–5.0)
HCT: 34.9 % — ABNORMAL LOW (ref 36.0–46.0)
Hemoglobin: 11.6 g/dL — ABNORMAL LOW (ref 12.0–15.0)
Lymphocytes Relative: 26.8 % (ref 12.0–46.0)
Lymphs Abs: 1.9 10*3/uL (ref 0.7–4.0)
MCHC: 33.3 g/dL (ref 30.0–36.0)
MCV: 91.2 fl (ref 78.0–100.0)
Monocytes Absolute: 0.6 10*3/uL (ref 0.1–1.0)
Monocytes Relative: 8.8 % (ref 3.0–12.0)
Neutro Abs: 4 10*3/uL (ref 1.4–7.7)
Neutrophils Relative %: 57.5 % (ref 43.0–77.0)
Platelets: 467 10*3/uL — ABNORMAL HIGH (ref 150.0–400.0)
RBC: 3.83 Mil/uL — ABNORMAL LOW (ref 3.87–5.11)
RDW: 15.1 % (ref 11.5–15.5)
WBC: 6.9 10*3/uL (ref 4.0–10.5)

## 2019-09-22 LAB — FERRITIN: Ferritin: 11.1 ng/mL (ref 10.0–291.0)

## 2019-12-23 ENCOUNTER — Telehealth: Payer: Self-pay

## 2019-12-23 NOTE — Telephone Encounter (Signed)
Left detailed message with information regarding when to go to the lab for repeat labs, pt advised to call office with any questions or concerns.

## 2019-12-23 NOTE — Telephone Encounter (Signed)
-----   Message from Algernon Huxley, RN sent at 09/22/2019  2:32 PM EST ----- Regarding: labs Pt needs labs, orders in epic.

## 2019-12-26 ENCOUNTER — Other Ambulatory Visit (INDEPENDENT_AMBULATORY_CARE_PROVIDER_SITE_OTHER): Payer: Medicare PPO

## 2019-12-26 ENCOUNTER — Other Ambulatory Visit: Payer: Self-pay

## 2019-12-26 DIAGNOSIS — D500 Iron deficiency anemia secondary to blood loss (chronic): Secondary | ICD-10-CM

## 2019-12-26 LAB — CBC WITH DIFFERENTIAL/PLATELET
Basophils Absolute: 0.1 10*3/uL (ref 0.0–0.1)
Basophils Relative: 1 % (ref 0.0–3.0)
Eosinophils Absolute: 0.2 10*3/uL (ref 0.0–0.7)
Eosinophils Relative: 3.8 % (ref 0.0–5.0)
HCT: 32 % — ABNORMAL LOW (ref 36.0–46.0)
Hemoglobin: 10.7 g/dL — ABNORMAL LOW (ref 12.0–15.0)
Lymphocytes Relative: 32.7 % (ref 12.0–46.0)
Lymphs Abs: 1.8 10*3/uL (ref 0.7–4.0)
MCHC: 33.6 g/dL (ref 30.0–36.0)
MCV: 95.5 fl (ref 78.0–100.0)
Monocytes Absolute: 0.4 10*3/uL (ref 0.1–1.0)
Monocytes Relative: 7 % (ref 3.0–12.0)
Neutro Abs: 3 10*3/uL (ref 1.4–7.7)
Neutrophils Relative %: 55.5 % (ref 43.0–77.0)
Platelets: 366 10*3/uL (ref 150.0–400.0)
RBC: 3.34 Mil/uL — ABNORMAL LOW (ref 3.87–5.11)
RDW: 15.7 % — ABNORMAL HIGH (ref 11.5–15.5)
WBC: 5.4 10*3/uL (ref 4.0–10.5)

## 2019-12-26 LAB — FERRITIN: Ferritin: 21.8 ng/mL (ref 10.0–291.0)

## 2020-01-12 ENCOUNTER — Telehealth: Payer: Self-pay | Admitting: Family Medicine

## 2020-01-12 DIAGNOSIS — I10 Essential (primary) hypertension: Secondary | ICD-10-CM

## 2020-01-12 DIAGNOSIS — E119 Type 2 diabetes mellitus without complications: Secondary | ICD-10-CM

## 2020-01-12 DIAGNOSIS — D75839 Thrombocytosis, unspecified: Secondary | ICD-10-CM

## 2020-01-12 DIAGNOSIS — E1169 Type 2 diabetes mellitus with other specified complication: Secondary | ICD-10-CM

## 2020-01-12 NOTE — Telephone Encounter (Signed)
-----   Message from Ellamae Sia sent at 12/27/2019  2:27 PM EDT ----- Regarding: lab orders for Friday, 7.2.21 Patient is scheduled for CPX labs, please order future labs, Thanks , Karna Christmas

## 2020-01-13 ENCOUNTER — Other Ambulatory Visit (INDEPENDENT_AMBULATORY_CARE_PROVIDER_SITE_OTHER): Payer: Medicare PPO

## 2020-01-13 ENCOUNTER — Ambulatory Visit (INDEPENDENT_AMBULATORY_CARE_PROVIDER_SITE_OTHER): Payer: Medicare PPO

## 2020-01-13 VITALS — BP 111/59 | HR 101 | Wt 155.1 lb

## 2020-01-13 DIAGNOSIS — D75839 Thrombocytosis, unspecified: Secondary | ICD-10-CM

## 2020-01-13 DIAGNOSIS — E1169 Type 2 diabetes mellitus with other specified complication: Secondary | ICD-10-CM

## 2020-01-13 DIAGNOSIS — I10 Essential (primary) hypertension: Secondary | ICD-10-CM | POA: Diagnosis not present

## 2020-01-13 DIAGNOSIS — Z0000 Encounter for general adult medical examination without abnormal findings: Secondary | ICD-10-CM

## 2020-01-13 DIAGNOSIS — E785 Hyperlipidemia, unspecified: Secondary | ICD-10-CM | POA: Diagnosis not present

## 2020-01-13 DIAGNOSIS — D473 Essential (hemorrhagic) thrombocythemia: Secondary | ICD-10-CM

## 2020-01-13 DIAGNOSIS — E119 Type 2 diabetes mellitus without complications: Secondary | ICD-10-CM

## 2020-01-13 LAB — CBC WITH DIFFERENTIAL/PLATELET
Basophils Absolute: 0.1 10*3/uL (ref 0.0–0.1)
Basophils Relative: 1.9 % (ref 0.0–3.0)
Eosinophils Absolute: 0.2 10*3/uL (ref 0.0–0.7)
Eosinophils Relative: 3.9 % (ref 0.0–5.0)
HCT: 31.9 % — ABNORMAL LOW (ref 36.0–46.0)
Hemoglobin: 11 g/dL — ABNORMAL LOW (ref 12.0–15.0)
Lymphocytes Relative: 31.3 % (ref 12.0–46.0)
Lymphs Abs: 1.9 10*3/uL (ref 0.7–4.0)
MCHC: 34.5 g/dL (ref 30.0–36.0)
MCV: 95.2 fl (ref 78.0–100.0)
Monocytes Absolute: 0.4 10*3/uL (ref 0.1–1.0)
Monocytes Relative: 7.1 % (ref 3.0–12.0)
Neutro Abs: 3.4 10*3/uL (ref 1.4–7.7)
Neutrophils Relative %: 55.8 % (ref 43.0–77.0)
Platelets: 412 10*3/uL — ABNORMAL HIGH (ref 150.0–400.0)
RBC: 3.35 Mil/uL — ABNORMAL LOW (ref 3.87–5.11)
RDW: 15.6 % — ABNORMAL HIGH (ref 11.5–15.5)
WBC: 6.1 10*3/uL (ref 4.0–10.5)

## 2020-01-13 LAB — COMPREHENSIVE METABOLIC PANEL
ALT: 16 U/L (ref 0–35)
AST: 16 U/L (ref 0–37)
Albumin: 4.1 g/dL (ref 3.5–5.2)
Alkaline Phosphatase: 38 U/L — ABNORMAL LOW (ref 39–117)
BUN: 22 mg/dL (ref 6–23)
CO2: 25 mEq/L (ref 19–32)
Calcium: 9.4 mg/dL (ref 8.4–10.5)
Chloride: 99 mEq/L (ref 96–112)
Creatinine, Ser: 1.13 mg/dL (ref 0.40–1.20)
GFR: 47.75 mL/min — ABNORMAL LOW (ref >60.00)
Glucose, Bld: 126 mg/dL — ABNORMAL HIGH (ref 70–99)
Potassium: 3.9 mEq/L (ref 3.5–5.1)
Sodium: 137 mEq/L (ref 135–145)
Total Bilirubin: 0.3 mg/dL (ref 0.2–1.2)
Total Protein: 7.5 g/dL (ref 6.0–8.3)

## 2020-01-13 LAB — LIPID PANEL
Cholesterol: 191 mg/dL (ref 0–200)
HDL: 38.1 mg/dL — ABNORMAL LOW (ref >39.00)
Total CHOL/HDL Ratio: 5
Triglycerides: 537 mg/dL — ABNORMAL HIGH (ref 0.0–149.0)

## 2020-01-13 LAB — TSH: TSH: 1.04 u[IU]/mL (ref 0.35–4.50)

## 2020-01-13 LAB — LDL CHOLESTEROL, DIRECT: Direct LDL: 93 mg/dL

## 2020-01-13 LAB — HEMOGLOBIN A1C: Hgb A1c MFr Bld: 6 % (ref 4.6–6.5)

## 2020-01-13 LAB — FERRITIN: Ferritin: 13.9 ng/mL (ref 10.0–291.0)

## 2020-01-13 NOTE — Progress Notes (Signed)
PCP notes:  Health Maintenance: Foot exam- due   Abnormal Screenings: none   Patient concerns: none   Nurse concerns: none   Next PCP appt.: 01/20/2020 @ 10:15 am

## 2020-01-13 NOTE — Progress Notes (Signed)
Subjective:   Martha Hamilton is a 69 y.o. female who presents for Medicare Annual (Subsequent) preventive examination.  Review of Systems: N/A      I connected with the patient today by telephone and verified that I am speaking with the correct person using two identifiers. Location patient: home Location nurse: work Persons participating in the virtual visit: patient, Marine scientist.   I discussed the limitations, risks, security and privacy concerns of performing an evaluation and management service by telephone and the availability of in person appointments. I also discussed with the patient that there may be a patient responsible charge related to this service. The patient expressed understanding and verbally consented to this telephonic visit.    Interactive audio and video telecommunications were attempted between this nurse and patient, however failed, due to patient having technical difficulties OR patient did not have access to video capability.  We continued and completed visit with audio only.     Cardiac Risk Factors include: advanced age (>42mn, >>69women);diabetes mellitus;hypertension;dyslipidemia     Objective:    Today's Vitals   01/13/20 0943 01/13/20 0946  BP: (!) 111/59   Pulse: (!) 101   Weight: 155 lb 2 oz (70.4 kg)   PainSc:  5    Body mass index is 30.81 kg/m.  Advanced Directives 01/13/2020 01/10/2019 02/10/2018 02/10/2018 02/02/2018 12/21/2017 06/24/2016  Does Patient Have a Medical Advance Directive? Yes Yes - Yes Yes Yes Yes  Type of Advance Directive HRolling FieldsLiving will Living will - Living will Living will HGrand BlancLiving will Living will  Does patient want to make changes to medical advance directive? - - No - Patient declined - - - -  Copy of HRio Lindain Chart? Yes - validated most recent copy scanned in chart (See row information) - - - - No - copy requested -    Current Medications  (verified) Outpatient Encounter Medications as of 01/13/2020  Medication Sig  . Accu-Chek Softclix Lancets lancets Use to check blood sugar once daily for DM (dx. E11.9)  . acetaminophen (TYLENOL) 500 MG tablet Take 1,000 mg by mouth every 6 (six) hours as needed for mild pain.   .Marland Kitchenalbuterol (VENTOLIN HFA) 108 (90 Base) MCG/ACT inhaler Inhale 2 puffs into the lungs every 4 (four) hours as needed for wheezing.  . Alcohol Swabs (B-D SINGLE USE SWABS REGULAR) PADS Use when checking blood sugar once daily for DM (dx. E11.9)  . amitriptyline (ELAVIL) 25 MG tablet Take 1 tablet (25 mg total) by mouth at bedtime.  .Marland Kitchenaspirin 81 MG tablet Take 81 mg by mouth at bedtime.   . Blood Glucose Calibration (ACCU-CHEK AVIVA) SOLN Use to calibrate meter  . Blood Glucose Monitoring Suppl (ACCU-CHEK AVIVA PLUS) w/Device KIT Use to check blood sugar once daily for DM (dx. E11.9)  . calcium elemental as carbonate (TUMS ULTRA 1000) 400 MG chewable tablet Chew 1,000 mg by mouth daily as needed for heartburn.  . Cholecalciferol (VITAMIN D) 2000 units tablet Take 2,000 Units by mouth daily.  . fenofibrate 54 MG tablet Take 1 tablet (54 mg total) by mouth daily.  . ferrous sulfate 325 (65 FE) MG tablet Take 1 tablet (325 mg total) by mouth 2 (two) times daily with a meal. (Patient taking differently: Take 325 mg by mouth 3 (three) times daily with meals. )  . glipiZIDE (GLUCOTROL XL) 2.5 MG 24 hr tablet Take 1 tablet (2.5 mg total) by mouth every evening.  .Marland Kitchen  glucose blood (ACCU-CHEK AVIVA PLUS) test strip Use to check blood sugar once daily for DM (dx. E11.9)  . hydrochlorothiazide (HYDRODIURIL) 25 MG tablet Take 1 tablet (25 mg total) by mouth daily.  Marland Kitchen loratadine (CLARITIN) 10 MG tablet Take 10 mg by mouth daily as needed for allergies.  Marland Kitchen losartan (COZAAR) 50 MG tablet Take 1 tablet (50 mg total) by mouth daily.  . meclizine (ANTIVERT) 25 MG tablet Take 1 tablet (25 mg total) by mouth 3 (three) times daily as needed for  dizziness.  . meloxicam (MOBIC) 15 MG tablet Take 1 tablet (15 mg total) by mouth daily.  . metFORMIN (GLUCOPHAGE) 1000 MG tablet TAKE 1 TABLET BY MOUTH TWO  TIMES DAILY WITH A MEAL  . metoprolol succinate (TOPROL-XL) 25 MG 24 hr tablet Take 1 tablet (25 mg total) by mouth daily.  Marland Kitchen omeprazole (PRILOSEC) 40 MG capsule Take 1 capsule (40 mg total) by mouth in the morning and at bedtime.  . ondansetron (ZOFRAN) 4 MG tablet Take 1 tablet (4 mg total) by mouth every 4 (four) hours as needed for nausea or vomiting.  Marland Kitchen SALINE NASAL SPRAY NA Place 1 spray into the nose daily as needed (congestion).    No facility-administered encounter medications on file as of 01/13/2020.    Allergies (verified) Cholestyramine, Lipitor [atorvastatin], Niacin, and Simvastatin   History: Past Medical History:  Diagnosis Date  . Allergic rhinitis   . Breast cancer ([place]) 1996   right breast  . DM2 (diabetes mellitus, type 2) ([place])   . GERD (gastroesophageal reflux disease)   . Hx of cardiovascular stress test    a. Lex MV 2/14:  EF 69%, no ischemia  . Hyperlipidemia   . Hypertension   . Iron deficiency anemia   . Obesity   . Osteoarthritis    hands  . Ulcer    Past Surgical History:  Procedure Laterality Date  . APPENDECTOMY  1976  . BREAST LUMPECTOMY Right 1996   right breast, lumpectomy w/nodes  . CHONDROPLASTY Left 02/10/2018   Procedure: CHONDROPLASTY;  Surgeon: Lovell Sheehan, MD;  Location: [facility] OR[facility];  Service: Orthopedics;  Laterality: Left;  Marland Kitchen GASTRIC RESTRICTION SURGERY     for reflux  . HYSTEROSCOPY WITH D & C N/A 10/18/2012   Procedure: DILATATION AND CURETTAGE /HYSTEROSCOPY;  Surgeon: Mora Bellman, MD;  Location: [facility];  Service: Gynecology;  Laterality: N/A;  . KNEE ARTHROSCOPY WITH MEDIAL MENISECTOMY Left 02/10/2018   Procedure: KNEE ARTHROSCOPY WITH MEDIAL and LATERAL MENISECTOMY;  Surgeon: Lovell Sheehan, MD;  Location: [facility];  Service: Orthopedics;  Laterality: Left;  .  POLYPECTOMY N/A 10/18/2012   Procedure: POLYPECTOMY;  Surgeon: Mora Bellman, MD;  Location: [facility];  Service: Gynecology;  Laterality: N/A;  . SYNOVECTOMY Left 02/10/2018   Procedure: SYNOVECTOMY;  Surgeon: Lovell Sheehan, MD;  Location: [facility];  Service: Orthopedics;  Laterality: Left;   Family History  Problem Relation Age of Onset  . Diabetes Mother   . Heart disease Mother        Pacemaker, CHF  . Hypertension Mother   . Kidney cancer Mother   . Stroke Father   . CAD Father 35       Died with MI  . Esophageal cancer Brother   . Heart disease Sister   . Colon cancer Neg Hx   . Rectal cancer Neg Hx   . Stomach cancer Neg Hx   . Pancreatic cancer Neg Hx    Social History  Socioeconomic History  . Marital status: Single    Spouse name: Not on file  . Number of children: 0  . Years of education: Not on file  . Highest education level: Not on file  Occupational History  . Occupation: retired    Fish farm manager: RETIRED  Tobacco Use  . Smoking status: Never Smoker  . Smokeless tobacco: Never Used  Vaping Use  . Vaping Use: Never used  Substance and Sexual Activity  . Alcohol use: Never    Alcohol/week: 0.0 standard drinks  . Drug use: No  . Sexual activity: Not Currently    Birth control/protection: Post-menopausal  Other Topics Concern  . Not on file  Social History Narrative   Lives with, and cares for her elderly mother.   Social Determinants of Health   Financial Resource Strain:   . Difficulty of Paying Living Expenses:   Food Insecurity:   . Worried About Charity fundraiser in the Last Year:   . Arboriculturist in the Last Year:   Transportation Needs:   . Film/video editor (Medical):   Marland Kitchen Lack of Transportation (Non-Medical):   Physical Activity:   . Days of Exercise per Week:   . Minutes of Exercise per Session:   Stress:   . Feeling of Stress :   Social Connections:   . Frequency of Communication with Friends and Family:   . Frequency of Social  Gatherings with Friends and Family:   . Attends Religious Services:   . Active Member of Clubs or Organizations:   . Attends Archivist Meetings:   Marland Kitchen Marital Status:     Tobacco Counseling Counseling given: Not Answered   Clinical Intake:  Pre-visit preparation completed: Yes  Pain : 0-10 Pain Score: 5  Pain Type: Chronic pain Pain Location: Knee Pain Orientation: Left, Right Pain Descriptors / Indicators: Aching Pain Onset: More than a month ago Pain Frequency: Intermittent     Nutritional Status: BMI > 30  Obese Nutritional Risks: None Diabetes: Yes CBG done?: No Did pt. bring in CBG monitor from home?: No  How often do you need to have someone help you when you read instructions, pamphlets, or other written materials from your doctor or pharmacy?: 1 - Never What is the last grade level you completed in school?: 12th, some college  Diabetic: Yes Nutrition Risk Assessment:  Has the patient had any N/V/D within the last 2 months?  No  Does the patient have any non-healing wounds?  No  Has the patient had any unintentional weight loss or weight gain?  No   Diabetes:  Is the patient diabetic?  Yes  If diabetic, was a CBG obtained today?  No  Did the patient bring in their glucometer from home?  No  How often do you monitor your CBG's? Twice daily.   Financial Strains and Diabetes Management:  Are you having any financial strains with the device, your supplies or your medication? No .  Does the patient want to be seen by Chronic Care Management for management of their diabetes?  No  Would the patient like to be referred to a Nutritionist or for Diabetic Management?  No   Diabetic Exams:  Diabetic Eye Exam: Completed 05/09/2019 Diabetic Foot Exam: Overdue, Pt has been advised about the importance in completing this exam. Pt is scheduled for diabetic foot exam on 01/20/2020.   Interpreter Needed?: No  Information entered by :: CJohnson,  LPN   Activities of Daily Living  In your present state of health, do you have any difficulty performing the following activities: 01/13/2020  Hearing? N  Vision? N  Difficulty concentrating or making decisions? N  Walking or climbing stairs? N  Dressing or bathing? N  Doing errands, shopping? N  Preparing Food and eating ? N  Using the Toilet? N  In the past six months, have you accidently leaked urine? Y  Comment only when coughing or sneezing  Do you have problems with loss of bowel control? N  Managing your Medications? N  Managing your Finances? N  Housekeeping or managing your Housekeeping? N  Some recent data might be hidden    Patient Care Team: Tower, Wynelle Fanny, MD as PCP - General  Indicate any recent Medical Services you may have received from other than [facility] providers in the past year (date may be approximate).     Assessment:   This is a routine wellness examination for [facility].  Hearing/Vision screen  Hearing Screening   125Hz  250Hz  500Hz  1000Hz  2000Hz  3000Hz  4000Hz  6000Hz  8000Hz   Right ear:           Left ear:           Vision Screening Comments: Patient gets annual eye exams  Dietary issues and exercise activities discussed: Current Exercise Habits: Home exercise routine, Type of exercise: walking, Time (Minutes): 30, Frequency (Times/Week): 7, Weekly Exercise (Minutes/Week): 210, Intensity: Moderate, Exercise limited by: None identified  Goals    . DIET - INCREASE WATER INTAKE     Starting 01/10/2019, I will continue to drink at least 6-8 glasses of water daily.     . Patient Stated     01/13/2020, I will continue to walk everyday for 1 mile.       Depression Screen PHQ 2/9 Scores 01/13/2020 01/10/2019 12/21/2017 03/01/2014 02/10/2013  PHQ - 2 Score 0 0 0 0 0  PHQ- 9 Score 0 0 0 - -    Fall Risk Fall Risk  01/13/2020 01/10/2019 12/21/2017 12/17/2016  Falls in the past year? 0 0 No No  Number falls in past yr: 0 - - -  Injury with Fall? 0 - - -  Risk for fall due  to : Medication side effect - - -  Follow up Falls evaluation completed;Falls prevention discussed - - -    Any stairs in or around the home? Yes  If so, are there any without handrails? No  Home free of loose throw rugs in walkways, pet beds, electrical cords, etc? Yes  Adequate lighting in your home to reduce risk of falls? Yes   ASSISTIVE DEVICES UTILIZED TO PREVENT FALLS:  Life alert? No  Use of a cane, walker or w/c? No  Grab bars in the bathroom? No  Shower chair or bench in shower? No  Elevated toilet seat or a handicapped toilet? No   TIMED UP AND GO:  Was the test performed? N/A, telephonic visit.    Cognitive Function: MMSE - Mini Mental State Exam 01/13/2020 01/10/2019 12/21/2017  Orientation to time 5 5 5   Orientation to Place 5 5 5   Registration 3 3 3   Attention/ Calculation 5 0 0  Recall 3 3 3   Language- name 2 objects - 0 0  Language- repeat 1 1 1   Language- follow 3 step command - 0 3  Language- read & follow direction - 0 0  Write a sentence - 0 0  Copy design - 0 0  Total score - 17 20  Mini  Cog  Mini-Cog screen was completed. Maximum score is 22. A value of 0 denotes this part of the MMSE was not completed or the patient failed this part of the Mini-Cog screening.       Immunizations Immunization History  Administered Date(s) Administered  . Fluad Quad(high Dose 65+) 03/10/2019  . Influenza Split 04/09/2011, 04/01/2012  . Influenza Whole 04/23/2007, 04/10/2008, 04/10/2009, 04/02/2010  . Influenza,inj,Quad PF,6+ Mos 03/23/2013, 03/29/2014, 03/13/2015, 04/10/2016, 03/25/2017, 04/08/2018  . PFIZER SARS-COV-2 Vaccination 09/05/2019, 09/26/2019  . Pneumococcal Conjugate-13 02/08/2016  . Pneumococcal Polysaccharide-23 04/13/2008, 05/16/2013, 02/26/2017  . Td 02/09/2009  . Zoster 01/28/2011    TDAP status: Due, Education has been provided regarding the importance of this vaccine. Advised may receive this vaccine at local pharmacy or Health Dept. Aware  to provide a copy of the vaccination record if obtained from local pharmacy or Health Dept. Verbalized acceptance and understanding. Flu Vaccine status: Up to date Pneumococcal vaccine status: Up to date Covid-19 vaccine status: Completed vaccines  Qualifies for Shingles Vaccine? Yes   Zostavax completed Yes   Shingrix Completed?: No.    Education has been provided regarding the importance of this vaccine. Patient has been advised to call insurance company to determine out of pocket expense if they have not yet received this vaccine. Advised may also receive vaccine at local pharmacy or Health Dept. Verbalized acceptance and understanding.  Screening Tests Health Maintenance  Topic Date Due  . FOOT EXAM  05/12/2019  . HEMOGLOBIN A1C  01/09/2020  . COLON CANCER SCREENING ANNUAL FOBT  08/15/2020 (Originally 05/06/2013)  . Hepatitis C Screening  08/15/2020 (Originally 27-May-1951)  . TETANUS/TDAP  04/12/2029 (Originally 02/10/2019)  . INFLUENZA VACCINE  02/12/2020  . MAMMOGRAM  05/04/2020  . OPHTHALMOLOGY EXAM  05/08/2020  . COLONOSCOPY  05/07/2023  . DEXA SCAN  Completed  . COVID-19 Vaccine  Completed  . PNA vac Low Risk Adult  Completed    Health Maintenance  Health Maintenance Due  Topic Date Due  . FOOT EXAM  05/12/2019  . HEMOGLOBIN A1C  01/09/2020    Colorectal cancer screening: Completed 05/06/2013. Repeat every 10 years Mammogram status: Completed 05/05/2019. Repeat every year Bone Density status: Completed 09/18/2016. Results reflect: Bone density results: NORMAL. Repeat every 2-5 years.  Lung Cancer Screening: (Low Dose CT Chest recommended if Age 74-80 years, 30 pack-year currently smoking OR have quit w/in 15years.) does not qualify.    Additional Screening:  Hepatitis C Screening: does qualify; Completed declined  Vision Screening: Recommended annual ophthalmology exams for early detection of glaucoma and other disorders of the eye. Is the patient up to date with  their annual eye exam?  Yes  Who is the provider or what is the name of the office in which the patient attends annual eye exams? [facility] If pt is not established with a provider, would they like to be referred to a provider to establish care? No .   Dental Screening: Recommended annual dental exams for proper oral hygiene  Community Resource Referral / Chronic Care Management: CRR required this visit?  No   CCM required this visit?  No      Plan:     I have personally reviewed and noted the following in the patient's chart:   . Medical and social history . Use of alcohol, tobacco or illicit drugs  . Current medications and supplements . Functional ability and status . Nutritional status . Physical activity . Advanced directives . List of other physicians .  Hospitalizations, surgeries, and ER visits in previous 12 months . Vitals . Screenings to include cognitive, depression, and falls . Referrals and appointments  In addition, I have reviewed and discussed with patient certain preventive protocols, quality metrics, and best practice recommendations. A written personalized care plan for preventive services as well as general preventive health recommendations were provided to patient.   Due to this being a telephonic visit, the after visit summary with patients personalized plan was offered to patient via mail or my-chart. Patient preferred to pick up at office at next visit.  Andrez Grime, LPN   07/22/8240

## 2020-01-13 NOTE — Patient Instructions (Signed)
Martha Hamilton , Thank you for taking time to come for your Medicare Wellness Visit. I appreciate your ongoing commitment to your health goals. Please review the following plan we discussed and let me know if I can assist you in the future.   Screening recommendations/referrals: Colonoscopy: Up to date, completed 05/06/2013, due 04/2023 Mammogram: Up to date, completed 05/05/2019, due 04/2020 Bone Density: Up to date, completed 09/18/2016, due in 2-5 years Recommended yearly ophthalmology/optometry visit for glaucoma screening and checkup Recommended yearly dental visit for hygiene and checkup  Vaccinations: Influenza vaccine: Up to date, completed 03/10/2019, due 02/2020 Pneumococcal vaccine: Completed series Tdap vaccine: decline- insurance Shingles vaccine: due, check with insurance for coverage   Covid-19:Completed series  Advanced directives: copy in chart  Conditions/risks identified: diabetes, hypertension, hyperlipidemia  Next appointment: Follow up in one year for your annual wellness visit    Preventive Care 69 Years and Older, Female Preventive care refers to lifestyle choices and visits with your health care provider that can promote health and wellness. What does preventive care include?  A yearly physical exam. This is also called an annual well check.  Dental exams once or twice a year.  Routine eye exams. Ask your health care provider how often you should have your eyes checked.  Personal lifestyle choices, including:  Daily care of your teeth and gums.  Regular physical activity.  Eating a healthy diet.  Avoiding tobacco and drug use.  Limiting alcohol use.  Practicing safe sex.  Taking low-dose aspirin every day.  Taking vitamin and mineral supplements as recommended by your health care provider. What happens during an annual well check? The services and screenings done by your health care provider during your annual well check will depend on your age,  overall health, lifestyle risk factors, and family history of disease. Counseling  Your health care provider may ask you questions about your:  Alcohol use.  Tobacco use.  Drug use.  Emotional well-being.  Home and relationship well-being.  Sexual activity.  Eating habits.  History of falls.  Memory and ability to understand (cognition).  Work and work Statistician.  Reproductive health. Screening  You may have the following tests or measurements:  Height, weight, and BMI.  Blood pressure.  Lipid and cholesterol levels. These may be checked every 5 years, or more frequently if you are over 69 years old.  Skin check.  Lung cancer screening. You may have this screening every year starting at age 69 if you have a 30-pack-year history of smoking and currently smoke or have quit within the past 15 years.  Fecal occult blood test (FOBT) of the stool. You may have this test every year starting at age 69  Flexible sigmoidoscopy or colonoscopy. You may have a sigmoidoscopy every 5 years or a colonoscopy every 10 years starting at age 69.  Hepatitis C blood test.  Hepatitis B blood test.  Sexually transmitted disease (STD) testing.  Diabetes screening. This is done by checking your blood sugar (glucose) after you have not eaten for a while (fasting). You may have this done every 1-3 years.  Bone density scan. This is done to screen for osteoporosis. You may have this done starting at age 69.  Mammogram. This may be done every 1-2 years. Talk to your health care provider about how often you should have regular mammograms. Talk with your health care provider about your test results, treatment options, and if necessary, the need for more tests. Vaccines  Your health care provider  may recommend certain vaccines, such as:  Influenza vaccine. This is recommended every year.  Tetanus, diphtheria, and acellular pertussis (Tdap, Td) vaccine. You may need a Td booster every 10  years.  Zoster vaccine. You may need this after age 69.  Pneumococcal 13-valent conjugate (PCV13) vaccine. One dose is recommended after age 69.  Pneumococcal polysaccharide (PPSV23) vaccine. One dose is recommended after age 69. Talk to your health care provider about which screenings and vaccines you need and how often you need them. This information is not intended to replace advice given to you by your health care provider. Make sure you discuss any questions you have with your health care provider. Document Released: 07/27/2015 Document Revised: 03/19/2016 Document Reviewed: 05/01/2015 Elsevier Interactive Patient Education  2017 [employer] Prevention in the Home Falls can cause injuries. They can happen to people of all ages. There are many things you can do to make your home safe and to help prevent falls. What can I do on the outside of my home?  Regularly fix the edges of walkways and driveways and fix any cracks.  Remove anything that might make you trip as you walk through a door, such as a raised step or threshold.  Trim any bushes or trees on the path to your home.  Use bright outdoor lighting.  Clear any walking paths of anything that might make someone trip, such as rocks or tools.  Regularly check to see if handrails are loose or broken. Make sure that both sides of any steps have handrails.  Any raised decks and porches should have guardrails on the edges.  Have any leaves, snow, or ice cleared regularly.  Use sand or salt on walking paths during winter.  Clean up any spills in your garage right away. This includes oil or grease spills. What can I do in the bathroom?  Use night lights.  Install grab bars by the toilet and in the tub and shower. Do not use towel bars as grab bars.  Use non-skid mats or decals in the tub or shower.  If you need to sit down in the shower, use a plastic, non-slip stool.  Keep the floor dry. Clean up any water that  spills on the floor as soon as it happens.  Remove soap buildup in the tub or shower regularly.  Attach bath mats securely with double-sided non-slip rug tape.  Do not have throw rugs and other things on the floor that can make you trip. What can I do in the bedroom?  Use night lights.  Make sure that you have a light by your bed that is easy to reach.  Do not use any sheets or blankets that are too big for your bed. They should not hang down onto the floor.  Have a firm chair that has side arms. You can use this for support while you get dressed.  Do not have throw rugs and other things on the floor that can make you trip. What can I do in the kitchen?  Clean up any spills right away.  Avoid walking on wet floors.  Keep items that you use a lot in easy-to-reach places.  If you need to reach something above you, use a strong step stool that has a grab bar.  Keep electrical cords out of the way.  Do not use floor polish or wax that makes floors slippery. If you must use wax, use non-skid floor wax.  Do not have throw rugs  and other things on the floor that can make you trip. What can I do with my stairs?  Do not leave any items on the stairs.  Make sure that there are handrails on both sides of the stairs and use them. Fix handrails that are broken or loose. Make sure that handrails are as long as the stairways.  Check any carpeting to make sure that it is firmly attached to the stairs. Fix any carpet that is loose or worn.  Avoid having throw rugs at the top or bottom of the stairs. If you do have throw rugs, attach them to the floor with carpet tape.  Make sure that you have a light switch at the top of the stairs and the bottom of the stairs. If you do not have them, ask someone to add them for you. What else can I do to help prevent falls?  Wear shoes that:  Do not have high heels.  Have rubber bottoms.  Are comfortable and fit you well.  Are closed at the  toe. Do not wear sandals.  If you use a stepladder:  Make sure that it is fully opened. Do not climb a closed stepladder.  Make sure that both sides of the stepladder are locked into place.  Ask someone to hold it for you, if possible.  Clearly mark and make sure that you can see:  Any grab bars or handrails.  First and last steps.  Where the edge of each step is.  Use tools that help you move around (mobility aids) if they are needed. These include:  Canes.  Walkers.  Scooters.  Crutches.  Turn on the lights when you go into a dark area. Replace any light bulbs as soon as they burn out.  Set up your furniture so you have a clear path. Avoid moving your furniture around.  If any of your floors are uneven, fix them.  If there are any pets around you, be aware of where they are.  Review your medicines with your doctor. Some medicines can make you feel dizzy. This can increase your chance of falling. Ask your doctor what other things that you can do to help prevent falls. This information is not intended to replace advice given to you by your health care provider. Make sure you discuss any questions you have with your health care provider. Document Released: 04/26/2009 Document Revised: 12/06/2015 Document Reviewed: 08/04/2014 Elsevier Interactive Patient Education  2017 [employer].

## 2020-01-19 ENCOUNTER — Encounter: Payer: Medicare Other | Admitting: Family Medicine

## 2020-01-20 ENCOUNTER — Ambulatory Visit (INDEPENDENT_AMBULATORY_CARE_PROVIDER_SITE_OTHER): Payer: Medicare PPO | Admitting: Family Medicine

## 2020-01-20 ENCOUNTER — Encounter: Payer: Self-pay | Admitting: Family Medicine

## 2020-01-20 ENCOUNTER — Other Ambulatory Visit: Payer: Self-pay

## 2020-01-20 VITALS — BP 136/78 | HR 100 | Temp 96.9°F | Ht 59.5 in | Wt 163.0 lb

## 2020-01-20 DIAGNOSIS — E119 Type 2 diabetes mellitus without complications: Secondary | ICD-10-CM | POA: Diagnosis not present

## 2020-01-20 DIAGNOSIS — E1169 Type 2 diabetes mellitus with other specified complication: Secondary | ICD-10-CM

## 2020-01-20 DIAGNOSIS — D473 Essential (hemorrhagic) thrombocythemia: Secondary | ICD-10-CM

## 2020-01-20 DIAGNOSIS — E785 Hyperlipidemia, unspecified: Secondary | ICD-10-CM

## 2020-01-20 DIAGNOSIS — D649 Anemia, unspecified: Secondary | ICD-10-CM | POA: Diagnosis not present

## 2020-01-20 DIAGNOSIS — I10 Essential (primary) hypertension: Secondary | ICD-10-CM | POA: Diagnosis not present

## 2020-01-20 DIAGNOSIS — D75839 Thrombocytosis, unspecified: Secondary | ICD-10-CM

## 2020-01-20 DIAGNOSIS — Z0000 Encounter for general adult medical examination without abnormal findings: Secondary | ICD-10-CM | POA: Diagnosis not present

## 2020-01-20 DIAGNOSIS — E2839 Other primary ovarian failure: Secondary | ICD-10-CM

## 2020-01-20 DIAGNOSIS — E669 Obesity, unspecified: Secondary | ICD-10-CM | POA: Diagnosis not present

## 2020-01-20 MED ORDER — FENOFIBRATE 145 MG PO TABS: 145.0000 mg | ORAL_TABLET | Freq: Every day | ORAL | 3 refills | Status: DC

## 2020-01-20 NOTE — Progress Notes (Signed)
Subjective:    Patient ID: Martha Hamilton, female    DOB: [birth year], 69 y.o.   MRN: [id number]  This visit occurred during the SARS-CoV-2 public health emergency.  Safety protocols were in place, including screening questions prior to the visit, additional usage of staff PPE, and extensive cleaning of exam room while observing appropriate contact time as indicated for disinfecting solutions.    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)   Feeling fairly good  Has trouble with L knee  Stomach still bothers her - good days and bad days /hard to know what to eat    Had amw on 7/2 Noted foot exam was due   Mammogram 10/20  Self breast exam -no lumps    Eye exam 10/20 also   Colonoscopy 10/14  Has not seen blood in her stool  Still has to take iron tid  Sees Dr Henrene Pastor   dexa 3/18 -nl range bmd  Falls-no Fractures -no  Supplements vit D and tums  Exercise -walking every evening 2/3 to 1 m  Did her adv directive and brought a copy  She is covid immunized   HTN bp is stable today  No cp or palpitations or headaches or edema  No side effects to medicines  BP Readings from Last 3 Encounters:  01/20/20 136/78  01/13/20 (!) 111/59  07/13/19 136/80     Lab Results  Component Value Date   CREATININE 1.13 01/13/2020   BUN 22 01/13/2020   NA 137 01/13/2020   K 3.9 01/13/2020   CL 99 01/13/2020   CO2 25 01/13/2020   DM2 well controlled Lab Results  Component Value Date   HGBA1C 6.0 01/13/2020   Up to date eye care Good foot care  Metformin and glipizide  Arb for renal protection   H/o anemia and also thrombocytosis  Lab Results  Component Value Date   WBC 6.1 01/13/2020   HGB 11.0 (L) 01/13/2020    HCT 31.9 (L) 01/13/2020   MCV 95.2 01/13/2020   PLT 412.0 (H) 01/13/2020  up from 10.7 HB  Last pl was 366  Cholesterol  Lab Results  Component Value Date   CHOL 191 01/13/2020   CHOL 190 07/11/2019   CHOL 184 01/10/2019   Lab Results  Component Value Date   HDL 38.10 (L) 01/13/2020   HDL 39.50 07/11/2019   HDL 38.70 (L) 01/10/2019   Lab Results  Component Value Date   LDLCALC 51 10/21/2011   LDLCALC 48 04/29/2010   Lab Results  Component Value Date   TRIG (H) 01/13/2020    537.0 Triglyceride is over 400; calculations on Lipids are invalid.   TRIG 381.0 (H) 07/11/2019   TRIG (H) 01/10/2019    421.0 Triglyceride is over 400; calculations on Lipids are invalid.   Lab Results  Component Value Date   CHOLHDL 5 01/13/2020   CHOLHDL 5 07/11/2019   CHOLHDL 5 01/10/2019   Lab Results  Component Value Date   LDLDIRECT 93.0 01/13/2020   LDLDIRECT 100.0 07/11/2019   LDLDIRECT 99.0 01/10/2019   She takes fenofibrate 54 mg  Diet is very good  This is genetic   Patient  Active Problem List   Diagnosis Date Noted  . Tachycardia 01/11/2019  . Fatty liver 06/21/2018  . Obesity (BMI 30-39.9) 06/21/2018  . Dyspepsia 05/25/2018  . GERD (gastroesophageal reflux disease) 05/25/2018  . H/O vertigo 05/11/2018  . Pre-op examination 01/22/2018  . Thrombocytosis ([place]) 12/25/2017  . Tear of medial meniscus of knee 12/21/2017  . Left knee pain 09/21/2017  . Low back pain 07/06/2017  . Welcome to Medicare preventive visit 12/17/2016  . Grief reaction 12/17/2016  . Estrogen deficiency 09/08/2016  . Muscle pain 02/08/2016  . Anemia 02/09/2013  . Abnormal EKG 07/27/2012  . Special screening for malignant neoplasms, colon 04/29/2011  . Gynecological examination 04/29/2011  . Routine general medical examination at a health care facility 04/20/2011  . OSTEOARTHRITIS, HANDS, BILATERAL 10/31/2008  . Diabetes type 2, controlled ([place]) 07/10/2008  . Hyperlipidemia associated with type 2  diabetes mellitus ([place]) 01/26/2008  . Essential hypertension 01/26/2008  . ALLERGIC RHINITIS 12/07/2007   Past Medical History:  Diagnosis Date  . Allergic rhinitis   . Breast cancer ([place]) 1996   right breast  . DM2 (diabetes mellitus, type 2) ([place])   . GERD (gastroesophageal reflux disease)   . Hx of cardiovascular stress test    a. Lex MV 2/14:  EF 69%, no ischemia  . Hyperlipidemia   . Hypertension   . Iron deficiency anemia   . Obesity   . Osteoarthritis    hands  . Ulcer    Past Surgical History:  Procedure Laterality Date  . APPENDECTOMY  1976  . BREAST LUMPECTOMY Right 1996   right breast, lumpectomy w/nodes  . CHONDROPLASTY Left 02/10/2018   Procedure: CHONDROPLASTY;  Surgeon: Lovell Sheehan, MD;  Location: [facility];  Service: Orthopedics;  Laterality: Left;  Marland Kitchen GASTRIC RESTRICTION SURGERY     for reflux  . HYSTEROSCOPY WITH D & C N/A 10/18/2012   Procedure: DILATATION AND CURETTAGE /HYSTEROSCOPY;  Surgeon: Mora Bellman, MD;  Location: [place] [facility];  Service: Gynecology;  Laterality: N/A;  . KNEE ARTHROSCOPY WITH MEDIAL MENISECTOMY Left 02/10/2018   Procedure: KNEE ARTHROSCOPY WITH MEDIAL and LATERAL MENISECTOMY;  Surgeon: Lovell Sheehan, MD;  Location: [facility];  Service: Orthopedics;  Laterality: Left;  . POLYPECTOMY N/A 10/18/2012   Procedure: POLYPECTOMY;  Surgeon: Mora Bellman, MD;  Location: [place] [facility];  Service: Gynecology;  Laterality: N/A;  . SYNOVECTOMY Left 02/10/2018   Procedure: SYNOVECTOMY;  Surgeon: Lovell Sheehan, MD;  Location: [facility];  Service: Orthopedics;  Laterality: Left;   Social History   Tobacco Use  . Smoking status: Never Smoker  . Smokeless tobacco: Never Used  Vaping Use  . Vaping Use: Never used  Substance Use Topics  . Alcohol use: Never    Alcohol/week: 0.0 standard drinks  . Drug use: No   Family History  Problem Relation Age of Onset  . Diabetes Mother   . Heart disease Mother        Pacemaker, CHF  . Hypertension Mother   .  Kidney cancer Mother   . Stroke Father   . CAD Father 87       Died with MI  . Esophageal cancer Brother   . Heart disease Sister   . Colon cancer Neg Hx   . Rectal cancer Neg Hx   . Stomach cancer Neg Hx   . Pancreatic cancer Neg Hx    Allergies  Allergen Reactions  . Cholestyramine Other (See Comments)    REACTION: Muscles tightened up, drew up.  Marland Kitchen  Lipitor [Atorvastatin] Other (See Comments)    Muscle cramping  . Niacin Nausea And Vomiting  . Simvastatin     Muscular pain   Current Outpatient Medications on File Prior to Visit  Medication Sig Dispense Refill  . Accu-Chek Softclix Lancets lancets Use to check blood sugar once daily for DM (dx. E11.9) 100 each 3  . acetaminophen (TYLENOL) 500 MG tablet Take 1,000 mg by mouth every 6 (six) hours as needed for mild pain.     . Alcohol Swabs (B-D SINGLE USE SWABS REGULAR) PADS Use when checking blood sugar once daily for DM (dx. E11.9) 100 each 3  . amitriptyline (ELAVIL) 25 MG tablet Take 1 tablet (25 mg total) by mouth at bedtime. 90 tablet 3  . aspirin 81 MG tablet Take 81 mg by mouth at bedtime.     . Blood Glucose Calibration (ACCU-CHEK AVIVA) SOLN Use to calibrate meter 3 each 3  . Blood Glucose Monitoring Suppl (ACCU-CHEK AVIVA PLUS) w/Device KIT Use to check blood sugar once daily for DM (dx. E11.9) 1 kit 0  . calcium elemental as carbonate (TUMS ULTRA 1000) 400 MG chewable tablet Chew 1,000 mg by mouth daily as needed for heartburn.    . Cholecalciferol (VITAMIN D) 2000 units tablet Take 2,000 Units by mouth daily.    . ferrous sulfate 325 (65 FE) MG tablet Take 1 tablet (325 mg total) by mouth 2 (two) times daily with a meal. (Patient taking differently: Take 325 mg by mouth 3 (three) times daily with meals. ) 180 tablet 3  . glipiZIDE (GLUCOTROL XL) 2.5 MG 24 hr tablet Take 1 tablet (2.5 mg total) by mouth every evening. 90 tablet 3  . glucose blood (ACCU-CHEK AVIVA PLUS) test strip Use to check blood sugar once daily for DM  (dx. E11.9) 100 each 3  . hydrochlorothiazide (HYDRODIURIL) 25 MG tablet Take 1 tablet (25 mg total) by mouth daily. 90 tablet 3  . loratadine (CLARITIN) 10 MG tablet Take 10 mg by mouth daily as needed for allergies.    Marland Kitchen losartan (COZAAR) 50 MG tablet Take 1 tablet (50 mg total) by mouth daily. 90 tablet 3  . meclizine (ANTIVERT) 25 MG tablet Take 1 tablet (25 mg total) by mouth 3 (three) times daily as needed for dizziness. 90 tablet 3  . metFORMIN (GLUCOPHAGE) 1000 MG tablet TAKE 1 TABLET BY MOUTH TWO  TIMES DAILY WITH A MEAL 180 tablet 3  . metoprolol succinate (TOPROL-XL) 25 MG 24 hr tablet Take 1 tablet (25 mg total) by mouth daily. 90 tablet 3  . omeprazole (PRILOSEC) 40 MG capsule Take 1 capsule (40 mg total) by mouth in the morning and at bedtime. 180 capsule 1  . ondansetron (ZOFRAN) 4 MG tablet Take 1 tablet (4 mg total) by mouth every 4 (four) hours as needed for nausea or vomiting. 90 tablet 2  . SALINE NASAL SPRAY NA Place 1 spray into the nose daily as needed (congestion).      No current facility-administered medications on file prior to visit.    Review of Systems  Constitutional: Negative for activity change, appetite change, fatigue, fever and unexpected weight change.  HENT: Negative for congestion, ear pain, rhinorrhea, sinus pressure and sore throat.   Eyes: Negative for pain, redness and visual disturbance.  Respiratory: Negative for cough, shortness of breath and wheezing.   Cardiovascular: Negative for chest pain and palpitations.  Gastrointestinal: Negative for abdominal pain, blood in stool, constipation and diarrhea.  Endocrine: Negative  for polydipsia and polyuria.  Genitourinary: Negative for dysuria, frequency and urgency.  Musculoskeletal: Positive for arthralgias. Negative for back pain and myalgias.  Skin: Negative for pallor and rash.  Allergic/Immunologic: Negative for environmental allergies.  Neurological: Negative for dizziness, syncope and headaches.   Hematological: Negative for adenopathy. Does not bruise/bleed easily.  Psychiatric/Behavioral: Negative for decreased concentration and dysphoric mood. The patient is not nervous/anxious.        Objective:   Physical Exam Constitutional:      General: She is not in acute distress.    Appearance: Normal appearance. She is well-developed. She is obese. She is not ill-appearing or diaphoretic.  HENT:     Head: Normocephalic and atraumatic.     Right Ear: Tympanic membrane, ear canal and external ear normal.     Left Ear: Tympanic membrane, ear canal and external ear normal.     Nose: Nose normal. No congestion.     Mouth/Throat:     Mouth: Mucous membranes are moist.     Pharynx: Oropharynx is clear. No posterior oropharyngeal erythema.  Eyes:     General: No scleral icterus.    Extraocular Movements: Extraocular movements intact.     Conjunctiva/sclera: Conjunctivae normal.     Pupils: Pupils are equal, round, and reactive to light.  Neck:     Thyroid: No thyromegaly.     Vascular: No carotid bruit or JVD.  Cardiovascular:     Rate and Rhythm: Normal rate and regular rhythm.     Pulses: Normal pulses.     Heart sounds: Normal heart sounds. No gallop.   Pulmonary:     Effort: Pulmonary effort is normal. No respiratory distress.     Breath sounds: Normal breath sounds. No wheezing.     Comments: Good air exch Chest:     Chest wall: No tenderness.  Abdominal:     General: Bowel sounds are normal. There is no distension or abdominal bruit.     Palpations: Abdomen is soft. There is no mass.     Tenderness: There is no abdominal tenderness.     Hernia: No hernia is present.  Genitourinary:    Comments: Breast exam: No mass, nodules, thickening, tenderness, bulging, retraction, inflamation, nipple discharge or skin changes noted.  No axillary or clavicular LA.     Musculoskeletal:        General: No tenderness. Normal range of motion.     Cervical back: Normal range of motion  and neck supple. No rigidity. No muscular tenderness.     Right lower leg: No edema.     Left lower leg: No edema.  Lymphadenopathy:     Cervical: No cervical adenopathy.  Skin:    General: Skin is warm and dry.     Coloration: Skin is not pale.     Findings: No erythema or rash.     Comments: Fair Few lentigines  Neurological:     Mental Status: She is alert. Mental status is at baseline.     Cranial Nerves: No cranial nerve deficit.     Motor: No abnormal muscle tone.     Coordination: Coordination normal.     Gait: Gait normal.     Deep Tendon Reflexes: Reflexes are normal and symmetric. Reflexes normal.  Psychiatric:        Mood and Affect: Mood normal.        Cognition and Memory: Cognition and memory normal.           Assessment & Plan:  Problem List Items Addressed This Visit      Cardiovascular and Mediastinum   Essential hypertension    bp in fair control at this time  BP Readings from Last 1 Encounters:  01/20/20 136/78   No changes needed Most recent labs reviewed  Disc lifstyle change with low sodium diet and exercise        Relevant Medications   fenofibrate (TRICOR) 145 MG tablet     Endocrine   Diabetes type 2, controlled ([place])    Continues good control Lab Results  Component Value Date   HGBA1C 6.0 01/13/2020   utd eye and foot care Arb  Metformin and glipizide  Intolerant of statins and on a fibrate currently for very high triglycerides      Hyperlipidemia associated with type 2 diabetes mellitus (HCC)    Disc goals for lipids and reasons to control them Rev last labs with pt Rev low sat fat diet in detail  Trig are up unfortunately despite good diet  Will increase fenofibrate dose to 145 mg and re check 6 wk      Relevant Medications   fenofibrate (TRICOR) 145 MG tablet     Hematopoietic and Hemostatic   Thrombocytosis (HCC)    Mild  platelet ct 412  No bleeding or bruising        Other   Routine general medical  examination at a health care facility - Primary    Reviewed health habits including diet and exercise and skin cancer prevention Reviewed appropriate screening tests for age  Also reviewed health mt list, fam hx and immunization status , as well as social and family history   See HPI Labs reviewed amw reviewed  Mammogram utd as is colonoscopy  Nl foot exam dexa scheduled for OP screening  Rev adv directive-will scan a copy Is covid immunized Interested in shingrix if covered-will look into that  Eye exam utd       Anemia    Ongoing  Very mild  Continues iron and GI f/u      Estrogen deficiency    Ref for screen dexa It has been 5 y since last nl one      Relevant Orders   DG Bone Density   Obesity (BMI 30-39.9)    Discussed how this problem influences overall health and the risks it imposes  Reviewed plan for weight loss with lower calorie diet (via better food choices and also portion control or program like weight watchers) and exercise building up to or more than 30 minutes 5 days per week including some aerobic activity

## 2020-01-20 NOTE — Patient Instructions (Addendum)
Call to schedule your bone density test when it is convenient   Keep eating a healthy diet I sent in an increased fenofibrate dose for triglycerides  Let's re check in 6 weeks   Keep up the good work  Keep walking   If you are interested in the new shingles vaccine (Shingrix) - call your local pharmacy to check on coverage and availability  If affordable, get on a wait list at your pharmacy to get the vaccine.

## 2020-01-21 NOTE — Assessment & Plan Note (Signed)
Disc goals for lipids and reasons to control them Rev last labs with pt Rev low sat fat diet in detail  Trig are up unfortunately despite good diet  Will increase fenofibrate dose to 145 mg and re check 6 wk

## 2020-01-21 NOTE — Assessment & Plan Note (Signed)
Ongoing  Very mild  Continues iron and GI f/u

## 2020-01-21 NOTE — Assessment & Plan Note (Signed)
bp in fair control at this time  BP Readings from Last 1 Encounters:  01/20/20 136/78   No changes needed Most recent labs reviewed  Disc lifstyle change with low sodium diet and exercise

## 2020-01-21 NOTE — Assessment & Plan Note (Signed)
Reviewed health habits including diet and exercise and skin cancer prevention Reviewed appropriate screening tests for age  Also reviewed health mt list, fam hx and immunization status , as well as social and family history   See HPI Labs reviewed amw reviewed  Mammogram utd as is colonoscopy  Nl foot exam dexa scheduled for OP screening  Rev adv directive-will scan a copy Is covid immunized Interested in shingrix if covered-will look into that  Eye exam utd

## 2020-01-21 NOTE — Assessment & Plan Note (Signed)
Mild  platelet ct 412  No bleeding or bruising

## 2020-01-21 NOTE — Assessment & Plan Note (Signed)
Continues good control Lab Results  Component Value Date   HGBA1C 6.0 01/13/2020   [facility]  Metformin and glipizide  Intolerant of statins and on a fibrate currently for very high triglycerides

## 2020-01-21 NOTE — Assessment & Plan Note (Signed)
Discussed how this problem influences overall health and the risks it imposes  Reviewed plan for weight loss with lower calorie diet (via better food choices and also portion control or program like weight watchers) and exercise building up to or more than 30 minutes 5 days per week including some aerobic activity    

## 2020-01-21 NOTE — Assessment & Plan Note (Signed)
Ref for screen dexa It has been 5 y since last nl one

## 2020-01-23 ENCOUNTER — Other Ambulatory Visit: Payer: Self-pay | Admitting: Internal Medicine

## 2020-02-06 ENCOUNTER — Other Ambulatory Visit: Payer: Self-pay | Admitting: Family Medicine

## 2020-02-06 DIAGNOSIS — Z1231 Encounter for screening mammogram for malignant neoplasm of breast: Secondary | ICD-10-CM

## 2020-02-06 DIAGNOSIS — E2839 Other primary ovarian failure: Secondary | ICD-10-CM

## 2020-02-14 ENCOUNTER — Other Ambulatory Visit: Payer: Self-pay | Admitting: Podiatry

## 2020-02-14 ENCOUNTER — Ambulatory Visit (INDEPENDENT_AMBULATORY_CARE_PROVIDER_SITE_OTHER): Payer: Medicare PPO

## 2020-02-14 ENCOUNTER — Ambulatory Visit: Payer: Medicare PPO | Admitting: Podiatry

## 2020-02-14 ENCOUNTER — Other Ambulatory Visit: Payer: Self-pay

## 2020-02-14 ENCOUNTER — Encounter: Payer: Self-pay | Admitting: Podiatry

## 2020-02-14 DIAGNOSIS — M778 Other enthesopathies, not elsewhere classified: Secondary | ICD-10-CM | POA: Diagnosis not present

## 2020-02-14 DIAGNOSIS — S93601A Unspecified sprain of right foot, initial encounter: Secondary | ICD-10-CM

## 2020-02-14 DIAGNOSIS — R600 Localized edema: Secondary | ICD-10-CM

## 2020-02-14 DIAGNOSIS — L03116 Cellulitis of left lower limb: Secondary | ICD-10-CM | POA: Diagnosis not present

## 2020-02-14 MED ORDER — DOXYCYCLINE HYCLATE 100 MG PO TABS: 100.0000 mg | ORAL_TABLET | Freq: Two times a day (BID) | ORAL | 0 refills | Status: DC

## 2020-02-14 MED ORDER — MELOXICAM 15 MG PO TABS
15.0000 mg | ORAL_TABLET | Freq: Every day | ORAL | 1 refills | Status: DC
Start: 2020-02-14 — End: 2020-07-24

## 2020-02-14 MED ORDER — METHYLPREDNISOLONE 4 MG PO TBPK
ORAL_TABLET | ORAL | 0 refills | Status: DC
Start: 2020-02-14 — End: 2020-07-24

## 2020-02-14 NOTE — Progress Notes (Signed)
   HPI: 69 y.o. female presenting today for new complaint regarding pain and inflammation to the lateral aspect of the left foot. This began on Sunday afternoon, 02/12/2020. Patient denies injury and she states that she got home from church and noticed that her foot began to be swollen and red and very painful. She has been applying ice and taking ibuprofen and Tylenol with moderate relief.  Past Medical History:  Diagnosis Date  . Allergic rhinitis   . Breast cancer ([place]) 1996   right breast  . DM2 (diabetes mellitus, type 2) ([place])   . GERD (gastroesophageal reflux disease)   . Hx of cardiovascular stress test    a. Lex MV 2/14:  EF 69%, no ischemia  . Hyperlipidemia   . Hypertension   . Iron deficiency anemia   . Obesity   . Osteoarthritis    hands  . Ulcer      Physical Exam: General: The patient is alert and oriented x3 in no acute distress.  Dermatology: Skin is warm, dry and supple bilateral lower extremities. Negative for open lesions or macerations. There are 2 pinpoint pustules, 1 noted to the lateral aspect of the heel, and one to the anterior lateral aspect of the ankle measuring approximately 3 mm in diameter.  Vascular: Palpable pedal pulses bilaterally. There is some moderate edema with erythema noted throughout the lateral aspect of the foot. Capillary refill within normal limits.  Neurological: Epicritic and protective threshold grossly intact bilaterally.   Musculoskeletal Exam: Range of motion within normal limits to all pedal and ankle joints bilateral. Muscle strength 5/5 in all groups bilateral. Significant pain on palpation throughout the lateral aspect of the rear foot and just distal to the lateral malleolus.  Radiographic Exam:  Normal osseous mineralization. Joint spaces preserved. No fracture/dislocation/boney destruction.    Assessment: 1. Possible inflammatory reaction secondary to insect bite 2. Inflamed pustules x2 left foot 3. Capsulitis left  foot   Plan of Care:  1. Patient evaluated. X-Rays reviewed.  2. Recommend antibiotic ointment a Band-Aid daily to the pustules 3. Prescription for doxycycline 100 mg #20 BID 4. Prescription for Medrol Dosepak 5. Prescription for meloxicam to begin after taking the Dosepak 6. Ace wrap provided. Wear daily 7. Resume immobilization cam boot. Patient has a cam boot at home. Weightbearing as tolerated. 8. Return to clinic in 3 weeks      Edrick Kins, DPM [facility]  Dr. Edrick Kins, DPM    [address], [place] [address]                Office [phone]  Fax [phone]

## 2020-03-02 ENCOUNTER — Other Ambulatory Visit (INDEPENDENT_AMBULATORY_CARE_PROVIDER_SITE_OTHER): Payer: Medicare PPO

## 2020-03-02 ENCOUNTER — Ambulatory Visit: Payer: Medicare PPO | Admitting: Family Medicine

## 2020-03-02 ENCOUNTER — Other Ambulatory Visit: Payer: Self-pay

## 2020-03-02 DIAGNOSIS — E785 Hyperlipidemia, unspecified: Secondary | ICD-10-CM

## 2020-03-02 DIAGNOSIS — E1169 Type 2 diabetes mellitus with other specified complication: Secondary | ICD-10-CM | POA: Diagnosis not present

## 2020-03-02 LAB — AST: AST: 15 U/L (ref 0–37)

## 2020-03-02 LAB — LIPID PANEL
Cholesterol: 197 mg/dL (ref 0–200)
HDL: 35.5 mg/dL — ABNORMAL LOW (ref >39.00)
Total CHOL/HDL Ratio: 6
Triglycerides: 499 mg/dL — ABNORMAL HIGH (ref 0.0–149.0)

## 2020-03-02 LAB — ALT: ALT: 16 U/L (ref 0–35)

## 2020-03-02 LAB — LDL CHOLESTEROL, DIRECT: Direct LDL: 100 mg/dL

## 2020-03-08 ENCOUNTER — Other Ambulatory Visit: Payer: Self-pay | Admitting: Family Medicine

## 2020-03-09 ENCOUNTER — Ambulatory Visit: Payer: Medicare PPO | Admitting: Podiatry

## 2020-03-27 ENCOUNTER — Telehealth: Payer: Self-pay

## 2020-03-27 NOTE — Telephone Encounter (Signed)
-----   Message from Yevette Edwards, RN sent at 12/26/2019 11:29 AM EDT ----- Repeat cbc, fer. Order in epic

## 2020-03-27 NOTE — Telephone Encounter (Signed)
Left detailed message remind patient that she is due for repeat labs. Advised patient that no appt is necessary and she may go by the lab at her convenience between 7:30-5, Monday through Friday. Advised patient to give [place] a call if she had any questions.

## 2020-03-28 ENCOUNTER — Other Ambulatory Visit (INDEPENDENT_AMBULATORY_CARE_PROVIDER_SITE_OTHER): Payer: Medicare PPO

## 2020-03-28 DIAGNOSIS — D500 Iron deficiency anemia secondary to blood loss (chronic): Secondary | ICD-10-CM

## 2020-03-28 LAB — CBC WITH DIFFERENTIAL/PLATELET
Basophils Absolute: 0.1 10*3/uL (ref 0.0–0.1)
Basophils Relative: 0.8 % (ref 0.0–3.0)
Eosinophils Absolute: 0.3 10*3/uL (ref 0.0–0.7)
Eosinophils Relative: 4.1 % (ref 0.0–5.0)
HCT: 34.8 % — ABNORMAL LOW (ref 36.0–46.0)
Hemoglobin: 11.7 g/dL — ABNORMAL LOW (ref 12.0–15.0)
Lymphocytes Relative: 25.6 % (ref 12.0–46.0)
Lymphs Abs: 1.7 10*3/uL (ref 0.7–4.0)
MCHC: 33.7 g/dL (ref 30.0–36.0)
MCV: 96.4 fl (ref 78.0–100.0)
Monocytes Absolute: 0.5 10*3/uL (ref 0.1–1.0)
Monocytes Relative: 7 % (ref 3.0–12.0)
Neutro Abs: 4.2 10*3/uL (ref 1.4–7.7)
Neutrophils Relative %: 62.5 % (ref 43.0–77.0)
Platelets: 368 10*3/uL (ref 150.0–400.0)
RBC: 3.62 Mil/uL — ABNORMAL LOW (ref 3.87–5.11)
RDW: 15.7 % — ABNORMAL HIGH (ref 11.5–15.5)
WBC: 6.8 10*3/uL (ref 4.0–10.5)

## 2020-03-28 LAB — FERRITIN: Ferritin: 17.9 ng/mL (ref 10.0–291.0)

## 2020-03-29 ENCOUNTER — Ambulatory Visit (INDEPENDENT_AMBULATORY_CARE_PROVIDER_SITE_OTHER): Payer: Medicare PPO

## 2020-03-29 ENCOUNTER — Other Ambulatory Visit: Payer: Self-pay

## 2020-03-29 DIAGNOSIS — Z23 Encounter for immunization: Secondary | ICD-10-CM | POA: Diagnosis not present

## 2020-04-21 ENCOUNTER — Other Ambulatory Visit: Payer: Self-pay | Admitting: Family Medicine

## 2020-05-08 ENCOUNTER — Other Ambulatory Visit: Payer: Self-pay

## 2020-05-08 ENCOUNTER — Ambulatory Visit
Admission: RE | Admit: 2020-05-08 | Discharge: 2020-05-08 | Disposition: A | Discharge status: Home / Self Care | Payer: Medicare PPO | Source: Ambulatory Visit | Attending: Family Medicine | Admitting: Family Medicine

## 2020-05-08 DIAGNOSIS — Z1231 Encounter for screening mammogram for malignant neoplasm of breast: Secondary | ICD-10-CM | POA: Diagnosis not present

## 2020-05-08 DIAGNOSIS — Z780 Asymptomatic menopausal state: Secondary | ICD-10-CM | POA: Diagnosis not present

## 2020-05-08 DIAGNOSIS — E2839 Other primary ovarian failure: Secondary | ICD-10-CM

## 2020-05-09 DIAGNOSIS — E119 Type 2 diabetes mellitus without complications: Secondary | ICD-10-CM | POA: Diagnosis not present

## 2020-05-09 LAB — HM DIABETES EYE EXAM

## 2020-05-10 ENCOUNTER — Encounter: Payer: Self-pay | Admitting: Family Medicine

## 2020-06-08 ENCOUNTER — Other Ambulatory Visit: Payer: Self-pay | Admitting: Internal Medicine

## 2020-06-29 ENCOUNTER — Encounter: Payer: Self-pay | Admitting: Family Medicine

## 2020-06-29 ENCOUNTER — Other Ambulatory Visit: Payer: Self-pay | Admitting: Family Medicine

## 2020-07-18 ENCOUNTER — Telehealth: Payer: Self-pay | Admitting: Family Medicine

## 2020-07-18 ENCOUNTER — Other Ambulatory Visit: Payer: Self-pay

## 2020-07-18 DIAGNOSIS — E785 Hyperlipidemia, unspecified: Secondary | ICD-10-CM

## 2020-07-18 DIAGNOSIS — E1169 Type 2 diabetes mellitus with other specified complication: Secondary | ICD-10-CM

## 2020-07-18 DIAGNOSIS — I10 Essential (primary) hypertension: Secondary | ICD-10-CM

## 2020-07-18 DIAGNOSIS — E119 Type 2 diabetes mellitus without complications: Secondary | ICD-10-CM

## 2020-07-18 NOTE — Telephone Encounter (Signed)
-----   Message from [name] sent at 07/02/2020  2:58 PM EST ----- Regarding: Lab orders for Thursday, 1.6.22 Lab orders, thanks

## 2020-07-19 ENCOUNTER — Other Ambulatory Visit (INDEPENDENT_AMBULATORY_CARE_PROVIDER_SITE_OTHER): Payer: Medicare PPO

## 2020-07-19 DIAGNOSIS — E119 Type 2 diabetes mellitus without complications: Secondary | ICD-10-CM | POA: Diagnosis not present

## 2020-07-19 DIAGNOSIS — I10 Essential (primary) hypertension: Secondary | ICD-10-CM | POA: Diagnosis not present

## 2020-07-19 DIAGNOSIS — E1169 Type 2 diabetes mellitus with other specified complication: Secondary | ICD-10-CM

## 2020-07-19 DIAGNOSIS — E785 Hyperlipidemia, unspecified: Secondary | ICD-10-CM | POA: Diagnosis not present

## 2020-07-19 LAB — COMPREHENSIVE METABOLIC PANEL
ALT: 17 U/L (ref 0–35)
AST: 22 U/L (ref 0–37)
Albumin: 4.2 g/dL (ref 3.5–5.2)
Alkaline Phosphatase: 28 U/L — ABNORMAL LOW (ref 39–117)
BUN: 30 mg/dL — ABNORMAL HIGH (ref 6–23)
CO2: 28 mEq/L (ref 19–32)
Calcium: 9.7 mg/dL (ref 8.4–10.5)
Chloride: 99 mEq/L (ref 96–112)
Creatinine, Ser: 1.68 mg/dL — ABNORMAL HIGH (ref 0.40–1.20)
GFR: 30.85 mL/min — ABNORMAL LOW (ref >60.00)
Glucose, Bld: 111 mg/dL — ABNORMAL HIGH (ref 70–99)
Potassium: 4.2 mEq/L (ref 3.5–5.1)
Sodium: 136 mEq/L (ref 135–145)
Total Bilirubin: 0.3 mg/dL (ref 0.2–1.2)
Total Protein: 7.5 g/dL (ref 6.0–8.3)

## 2020-07-19 LAB — HEMOGLOBIN A1C: Hgb A1c MFr Bld: 6 % (ref 4.6–6.5)

## 2020-07-19 LAB — LIPID PANEL
Cholesterol: 218 mg/dL — ABNORMAL HIGH (ref 0–200)
HDL: 36.6 mg/dL — ABNORMAL LOW (ref >39.00)
Total CHOL/HDL Ratio: 6
Triglycerides: 497 mg/dL — ABNORMAL HIGH (ref 0.0–149.0)

## 2020-07-19 LAB — LDL CHOLESTEROL, DIRECT: Direct LDL: 108 mg/dL

## 2020-07-23 DIAGNOSIS — M1712 Unilateral primary osteoarthritis, left knee: Secondary | ICD-10-CM | POA: Diagnosis not present

## 2020-07-24 ENCOUNTER — Encounter: Payer: Self-pay | Admitting: Family Medicine

## 2020-07-24 ENCOUNTER — Ambulatory Visit: Payer: Medicare PPO | Admitting: Family Medicine

## 2020-07-24 ENCOUNTER — Other Ambulatory Visit: Payer: Self-pay

## 2020-07-24 VITALS — BP 135/80 | HR 105 | Temp 97.1°F | Ht 59.5 in | Wt 159.0 lb

## 2020-07-24 DIAGNOSIS — R8290 Unspecified abnormal findings in urine: Secondary | ICD-10-CM

## 2020-07-24 DIAGNOSIS — R944 Abnormal results of kidney function studies: Secondary | ICD-10-CM | POA: Insufficient documentation

## 2020-07-24 DIAGNOSIS — E1169 Type 2 diabetes mellitus with other specified complication: Secondary | ICD-10-CM | POA: Diagnosis not present

## 2020-07-24 DIAGNOSIS — E669 Obesity, unspecified: Secondary | ICD-10-CM

## 2020-07-24 DIAGNOSIS — R110 Nausea: Secondary | ICD-10-CM | POA: Insufficient documentation

## 2020-07-24 DIAGNOSIS — I10 Essential (primary) hypertension: Secondary | ICD-10-CM

## 2020-07-24 DIAGNOSIS — E119 Type 2 diabetes mellitus without complications: Secondary | ICD-10-CM | POA: Diagnosis not present

## 2020-07-24 DIAGNOSIS — N1832 Chronic kidney disease, stage 3b: Secondary | ICD-10-CM | POA: Insufficient documentation

## 2020-07-24 DIAGNOSIS — N183 Chronic kidney disease, stage 3 unspecified: Secondary | ICD-10-CM | POA: Insufficient documentation

## 2020-07-24 DIAGNOSIS — D649 Anemia, unspecified: Secondary | ICD-10-CM | POA: Diagnosis not present

## 2020-07-24 DIAGNOSIS — E785 Hyperlipidemia, unspecified: Secondary | ICD-10-CM | POA: Diagnosis not present

## 2020-07-24 LAB — CBC WITH DIFFERENTIAL/PLATELET
Basophils Absolute: 0.1 10*3/uL (ref 0.0–0.1)
Basophils Relative: 1 % (ref 0.0–3.0)
Eosinophils Absolute: 0.3 10*3/uL (ref 0.0–0.7)
Eosinophils Relative: 5.9 % — ABNORMAL HIGH (ref 0.0–5.0)
HCT: 29.8 % — ABNORMAL LOW (ref 36.0–46.0)
Hemoglobin: 10 g/dL — ABNORMAL LOW (ref 12.0–15.0)
Lymphocytes Relative: 29.8 % (ref 12.0–46.0)
Lymphs Abs: 1.7 10*3/uL (ref 0.7–4.0)
MCHC: 33.5 g/dL (ref 30.0–36.0)
MCV: 96 fl (ref 78.0–100.0)
Monocytes Absolute: 0.4 10*3/uL (ref 0.1–1.0)
Monocytes Relative: 6.6 % (ref 3.0–12.0)
Neutro Abs: 3.2 10*3/uL (ref 1.4–7.7)
Neutrophils Relative %: 56.7 % (ref 43.0–77.0)
Platelets: 523 10*3/uL — ABNORMAL HIGH (ref 150.0–400.0)
RBC: 3.1 Mil/uL — ABNORMAL LOW (ref 3.87–5.11)
RDW: 14.5 % (ref 11.5–15.5)
WBC: 5.7 10*3/uL (ref 4.0–10.5)

## 2020-07-24 LAB — POC URINALSYSI DIPSTICK (AUTOMATED)
Bilirubin, UA: NEGATIVE
Blood, UA: 10
Glucose, UA: NEGATIVE
Ketones, UA: NEGATIVE
Nitrite, UA: POSITIVE
Protein, UA: NEGATIVE
Spec Grav, UA: 1.025 (ref 1.010–1.025)
Urobilinogen, UA: 0.2 E.U./dL
pH, UA: 5.5 (ref 5.0–8.0)

## 2020-07-24 LAB — BASIC METABOLIC PANEL
BUN: 27 mg/dL — ABNORMAL HIGH (ref 6–23)
CO2: 25 mEq/L (ref 19–32)
Calcium: 9.5 mg/dL (ref 8.4–10.5)
Chloride: 101 mEq/L (ref 96–112)
Creatinine, Ser: 1.53 mg/dL — ABNORMAL HIGH (ref 0.40–1.20)
GFR: 34.51 mL/min — ABNORMAL LOW (ref >60.00)
Glucose, Bld: 105 mg/dL — ABNORMAL HIGH (ref 70–99)
Potassium: 4.2 mEq/L (ref 3.5–5.1)
Sodium: 136 mEq/L (ref 135–145)

## 2020-07-24 LAB — IRON: Iron: 27 ug/dL — ABNORMAL LOW (ref 42–145)

## 2020-07-24 NOTE — Assessment & Plan Note (Signed)
Discussed how this problem influences overall health and the risks it imposes  Reviewed plan for weight loss with lower calorie diet (via better food choices and also portion control or program like weight watchers) and exercise building up to or more than 30 minutes 5 days per week including some aerobic activity    

## 2020-07-24 NOTE — Assessment & Plan Note (Signed)
Intolerant of all statins tried  Last visit inc fenofibrate to 145 mg daily  Triglycerides are still in high 400s unfortunately  Pt eats a low fat diet and DM2 is very well controlled

## 2020-07-24 NOTE — Assessment & Plan Note (Signed)
bp is above her baseline (along with pulse) today  Per pt -anxious with some stressors incl anxiety of her mom's death Plan to continue losartan 50 mg daily and hctz 25 mg daily  However if GFR is still down today may have to reduce or d/c hctz

## 2020-07-24 NOTE — Assessment & Plan Note (Signed)
Due to nausea pt stopped her oral iron  Diet is fair  Unsure of cause (sees GI) Cbc with iron today

## 2020-07-24 NOTE — Patient Instructions (Addendum)
Try to push water intake  Let's get a urine sample today  Labs also   We will contact you with instructions based on results

## 2020-07-24 NOTE — Assessment & Plan Note (Signed)
Repeat today with better water intake  Urine sent for culture  Depending on results may have to d/c hctz and/or dec metformin

## 2020-07-24 NOTE — Assessment & Plan Note (Addendum)
This is ongoing- much worse since October Found she is also intolerant of oral iron  Eating small portions and has lost wt  H/o fundoplication in past  May need GI referral Has not had trouble with metformin in the past but this is possible Encouraged good fluid intake

## 2020-07-24 NOTE — Progress Notes (Signed)
Subjective:    Patient ID: Martha Hamilton, female    DOB: 06-23-1951, 70 y.o.   MRN: [id number]  This visit occurred during the SARS-CoV-2 public health emergency.  Safety protocols were in place, including screening questions prior to the visit, additional usage of staff PPE, and extensive cleaning of exam room while observing appropriate contact time as indicated for disinfecting solutions.    HPI Pt presents for f/u of chronic medical problems   Wt Readings from Last 3 Encounters:  07/24/20 159 lb (72.1 kg)  01/20/20 163 lb (73.9 kg)  01/13/20 155 lb 2 oz (70.4 kg)   31.58 kg/m Weight is down  She started not tolerating fatty foods (they make her feel sick) -in October   She quit iron (when she quit taking it) and stopping helped some   Now small little meals  Cannot eat a large amount     Anemia  Lab Results  Component Value Date   WBC 6.8 03/28/2020   HGB 11.7 (L) 03/28/2020   HCT 34.8 (L) 03/28/2020   MCV 96.4 03/28/2020   PLT 368.0 03/28/2020    Lab Results  Component Value Date   ALT 17 07/19/2020   AST 22 07/19/2020   ALKPHOS 28 (L) 07/19/2020   BILITOT 0.3 07/19/2020     HTN bp is stable today  No cp or palpitations or headaches or edema  No side effects to medicines  BP Readings from Last 3 Encounters:  07/24/20 (!) 146/92  01/20/20 136/78  01/13/20 (!) 111/59    Pt is stressed  Aunt is dying and this is [place] of mother dying 3 y ago    Pulse Readings from Last 3 Encounters:  07/24/20 (!) 105  01/20/20 100  01/13/20 (!) 101   Losartan 50 mg daily    Lab Results  Component Value Date   CREATININE 1.68 (H) 07/19/2020   BUN 30 (H) 07/19/2020   NA 136 07/19/2020   K 4.2 07/19/2020   CL 99 07/19/2020   CO2 28 07/19/2020   This is a big increase in cr  No nsaids at all  Drinking a lot more water now since labs came back   No symptoms of uti  No burning or frequency      Hyperlipidemia Lab Results  Component Value Date    CHOL 218 (H) 07/19/2020   CHOL 197 03/02/2020   CHOL 191 01/13/2020   Lab Results  Component Value Date   HDL 36.60 (L) 07/19/2020   HDL 35.50 (L) 03/02/2020   HDL 38.10 (L) 01/13/2020   Lab Results  Component Value Date   LDLCALC 51 10/21/2011   Calio 48 04/29/2010   Lab Results  Component Value Date   TRIG (H) 07/19/2020    497.0 Triglyceride is over 400; calculations on Lipids are invalid.   TRIG (H) 03/02/2020    499.0 Triglyceride is over 400; calculations on Lipids are invalid.   TRIG (H) 01/13/2020    537.0 Triglyceride is over 400; calculations on Lipids are invalid.   Lab Results  Component Value Date   CHOLHDL 6 07/19/2020   CHOLHDL 6 03/02/2020   CHOLHDL 5 01/13/2020   Lab Results  Component Value Date   LDLDIRECT 108.0 07/19/2020   LDLDIRECT 100.0 03/02/2020   LDLDIRECT 93.0 01/13/2020   Last visit we inc fenofibrate to 145 mg  Trig did go from 537 to 499, now the same  Not eating any fat because she cannot tolerate it  Intol of statin   DM2 Lab Results  Component Value Date   HGBA1C 6.0 07/19/2020   This is stable  Metformin and glipizide  On arb   Patient Active Problem List   Diagnosis Date Noted  . Nausea 07/24/2020  . Decreased GFR 07/24/2020  . Tachycardia 01/11/2019  . Fatty liver 06/21/2018  . Obesity (BMI 30-39.9) 06/21/2018  . Dyspepsia 05/25/2018  . GERD (gastroesophageal reflux disease) 05/25/2018  . H/O vertigo 05/11/2018  . Pre-op examination 01/22/2018  . Thrombocytosis 12/25/2017  . Tear of medial meniscus of knee 12/21/2017  . Left knee pain 09/21/2017  . Low back pain 07/06/2017  . Welcome to Medicare preventive visit 12/17/2016  . Grief reaction 12/17/2016  . Estrogen deficiency 09/08/2016  . Muscle pain 02/08/2016  . Anemia 02/09/2013  . Abnormal EKG 07/27/2012  . Special screening for malignant neoplasms, colon 04/29/2011  . Gynecological examination 04/29/2011  . Routine general medical examination at a  health care facility 04/20/2011  . OSTEOARTHRITIS, HANDS, BILATERAL 10/31/2008  . Diabetes type 2, controlled ([place]) 07/10/2008  . Hyperlipidemia associated with type 2 diabetes mellitus ([place]) 01/26/2008  . Essential hypertension 01/26/2008  . ALLERGIC RHINITIS 12/07/2007   Past Medical History:  Diagnosis Date  . Allergic rhinitis   . Breast cancer ([place]) 1996   right breast  . DM2 (diabetes mellitus, type 2) ([place])   . GERD (gastroesophageal reflux disease)   . Hx of cardiovascular stress test    a. Lex MV 2/14:  EF 69%, no ischemia  . Hyperlipidemia   . Hypertension   . Iron deficiency anemia   . Obesity   . Osteoarthritis    hands  . Ulcer    Past Surgical History:  Procedure Laterality Date  . APPENDECTOMY  1976  . BREAST LUMPECTOMY Right 1996   right breast, lumpectomy w/nodes  . CHONDROPLASTY Left 02/10/2018   Procedure: CHONDROPLASTY;  Surgeon: Lovell Sheehan, MD;  Location: [facility];  Service: Orthopedics;  Laterality: Left;  Marland Kitchen GASTRIC RESTRICTION SURGERY     for reflux  . HYSTEROSCOPY WITH D & C N/A 10/18/2012   Procedure: DILATATION AND CURETTAGE /HYSTEROSCOPY;  Surgeon: Mora Bellman, MD;  Location: [facility];  Service: Gynecology;  Laterality: N/A;  . KNEE ARTHROSCOPY WITH MEDIAL MENISECTOMY Left 02/10/2018   Procedure: KNEE ARTHROSCOPY WITH MEDIAL and LATERAL MENISECTOMY;  Surgeon: Lovell Sheehan, MD;  Location: [facility];  Service: Orthopedics;  Laterality: Left;  . POLYPECTOMY N/A 10/18/2012   Procedure: POLYPECTOMY;  Surgeon: Mora Bellman, MD;  Location: [facility];  Service: Gynecology;  Laterality: N/A;  . SYNOVECTOMY Left 02/10/2018   Procedure: SYNOVECTOMY;  Surgeon: Lovell Sheehan, MD;  Location: [facility];  Service: Orthopedics;  Laterality: Left;   Social History   Tobacco Use  . Smoking status: Never Smoker  . Smokeless tobacco: Never Used  Vaping Use  . Vaping Use: Never used  Substance Use Topics  . Alcohol use: Never    Alcohol/week: 0.0 standard  drinks  . Drug use: No   Family History  Problem Relation Age of Onset  . Diabetes Mother   . Heart disease Mother        Pacemaker, CHF  . Hypertension Mother   . Kidney cancer Mother   . Stroke Father   . CAD Father 47       Died with MI  . Esophageal cancer Brother   . Heart disease Sister   . Colon cancer Neg Hx   . Rectal  cancer Neg Hx   . Stomach cancer Neg Hx   . Pancreatic cancer Neg Hx    Allergies  Allergen Reactions  . Cholestyramine Other (See Comments)    REACTION: Muscles tightened up, drew up.  . Lipitor [Atorvastatin] Other (See Comments)    Muscle cramping  . Niacin Nausea And Vomiting  . Simvastatin     Muscular pain   Current Outpatient Medications on File Prior to Visit  Medication Sig Dispense Refill  . Accu-Chek Softclix Lancets lancets TEST BLOOD SUGAR EVERY DAY FOR DIABETES 100 each 3  . acetaminophen (TYLENOL) 500 MG tablet Take 1,000 mg by mouth every 6 (six) hours as needed for mild pain.     . Alcohol Swabs (B-D SINGLE USE SWABS REGULAR) PADS USE ONE TIME DAILY AS DIRECTED WHEN CHECKING BLOOD SUGAR 100 each 3  . amitriptyline (ELAVIL) 25 MG tablet TAKE 1 TABLET AT BEDTIME 90 tablet 3  . aspirin 81 MG tablet Take 81 mg by mouth at bedtime.     . Blood Glucose Calibration (ACCU-CHEK AVIVA) SOLN Use to calibrate meter 3 each 3  . Blood Glucose Monitoring Suppl (ACCU-CHEK AVIVA PLUS) w/Device KIT Use to check blood sugar once daily for DM (dx. E11.9) 1 kit 0  . calcium elemental as carbonate (BARIATRIC TUMS ULTRA) 400 MG chewable tablet Chew 1,000 mg by mouth daily as needed for heartburn.    . Cholecalciferol (VITAMIN D) 2000 units tablet Take 2,000 Units by mouth daily.    . fenofibrate (TRICOR) 145 MG tablet Take 1 tablet (145 mg total) by mouth daily. 90 tablet 3  . glipiZIDE (GLUCOTROL XL) 2.5 MG 24 hr tablet TAKE 1 TABLET EVERY EVENING 90 tablet 3  . glucose blood (ACCU-CHEK AVIVA PLUS) test strip TEST BLOOD SUGAR EVERY DAY FOR DIABETES (Dx.  E11.9) 100 strip 1  . hydrochlorothiazide (HYDRODIURIL) 25 MG tablet TAKE 1 TABLET EVERY DAY 90 tablet 3  . loratadine (CLARITIN) 10 MG tablet Take 10 mg by mouth daily as needed for allergies.    Marland Kitchen losartan (COZAAR) 50 MG tablet TAKE 1 TABLET EVERY DAY 90 tablet 3  . meclizine (ANTIVERT) 25 MG tablet Take 1 tablet (25 mg total) by mouth 3 (three) times daily as needed for dizziness. 90 tablet 3  . metFORMIN (GLUCOPHAGE) 1000 MG tablet TAKE 1 TABLET TWICE DAILY WITH A MEAL 180 tablet 3  . omeprazole (PRILOSEC) 40 MG capsule TAKE 1 CAPSULE IN THE MORNING AND AT BEDTIME. 180 capsule 1  . ondansetron (ZOFRAN) 4 MG tablet Take 1 tablet (4 mg total) by mouth every 4 (four) hours as needed for nausea or vomiting. 90 tablet 2  . SALINE NASAL SPRAY NA Place 1 spray into the nose daily as needed (congestion).      No current facility-administered medications on file prior to visit.    Review of Systems  Constitutional: Negative for activity change, appetite change, fatigue, fever and unexpected weight change.  HENT: Negative for congestion, ear pain, rhinorrhea, sinus pressure and sore throat.   Eyes: Negative for pain, redness and visual disturbance.  Respiratory: Negative for cough, shortness of breath and wheezing.   Cardiovascular: Negative for chest pain and palpitations.  Gastrointestinal: Positive for nausea. Negative for abdominal pain, blood in stool, constipation, diarrhea and vomiting.  Endocrine: Negative for polydipsia and polyuria.  Genitourinary: Negative for dysuria, frequency, hematuria and urgency.  Musculoskeletal: Positive for arthralgias. Negative for back pain and myalgias.  Skin: Negative for pallor and rash.  Allergic/Immunologic: Negative  for environmental allergies.  Neurological: Negative for dizziness, syncope and headaches.  Hematological: Negative for adenopathy. Does not bruise/bleed easily.  Psychiatric/Behavioral: Negative for decreased concentration and dysphoric  mood. The patient is nervous/anxious.        Objective:   Physical Exam Constitutional:      General: She is not in acute distress.    Appearance: Normal appearance. She is well-developed and well-nourished. She is obese. She is not ill-appearing or diaphoretic.  HENT:     Head: Normocephalic and atraumatic.     Mouth/Throat:     Mouth: Oropharynx is clear and moist.  Eyes:     General: No scleral icterus.    Extraocular Movements: EOM normal.     Conjunctiva/sclera: Conjunctivae normal.     Pupils: Pupils are equal, round, and reactive to light.  Neck:     Thyroid: No thyromegaly.     Vascular: No carotid bruit or JVD.  Cardiovascular:     Rate and Rhythm: Regular rhythm. Tachycardia present.     Pulses: Normal pulses and intact distal pulses.     Heart sounds: Normal heart sounds. No gallop.   Pulmonary:     Effort: Pulmonary effort is normal. No respiratory distress.     Breath sounds: Normal breath sounds. No wheezing or rales.     Comments: No crackles Abdominal:     General: Bowel sounds are normal. There is no distension or abdominal bruit.     Palpations: Abdomen is soft. There is no mass.     Tenderness: There is no abdominal tenderness.  Musculoskeletal:        General: No edema.     Cervical back: Normal range of motion and neck supple. No tenderness.     Right lower leg: No edema.     Left lower leg: No edema.  Lymphadenopathy:     Cervical: No cervical adenopathy.  Skin:    General: Skin is warm and dry.     Coloration: Skin is not pale.     Findings: No erythema or rash.  Neurological:     Mental Status: She is alert.     Sensory: No sensory deficit.     Coordination: Coordination normal.     Deep Tendon Reflexes: Reflexes are normal and symmetric. Reflexes normal.  Psychiatric:        Mood and Affect: Mood is anxious.        Cognition and Memory: Cognition and memory normal.           Assessment & Plan:   Problem List Items Addressed This  Visit      Cardiovascular and Mediastinum   Essential hypertension    bp is above her baseline (along with pulse) today  Per pt -anxious with some stressors incl anxiety of her mom's death Plan to continue losartan 50 mg daily and hctz 25 mg daily  However if GFR is still down today may have to reduce or d/c hctz       Relevant Orders   Basic metabolic panel (Completed)     Endocrine   Diabetes type 2, controlled ([place])    Lab Results  Component Value Date   HGBA1C 6.0 07/19/2020   Excellent control with good diet and metformin and glipizide May need to cut metformin if GFR remains low today)       Hyperlipidemia associated with type 2 diabetes mellitus ([place])    Intolerant of all statins tried  Last visit inc fenofibrate to 145 mg daily  Triglycerides are still in high 400s unfortunately  Pt eats a low fat diet and DM2 is very well controlled         Other   Anemia    Due to nausea pt stopped her oral iron  Diet is fair  Unsure of cause (sees GI) Cbc with iron today      Relevant Orders   CBC with Differential/Platelet (Completed)   Iron (Completed)   Obesity (BMI 30-39.9)    Discussed how this problem influences overall health and the risks it imposes  Reviewed plan for weight loss with lower calorie diet (via better food choices and also portion control or program like weight watchers) and exercise building up to or more than 30 minutes 5 days per week including some aerobic activity         Nausea    This is ongoing- much worse since October Found she is also intolerant of oral iron  Eating small portions and has lost wt  H/o fundoplication in past  May need GI referral Has not had trouble with metformin in the past but this is possible Encouraged good fluid intake      Decreased GFR - Primary    Repeat today with better water intake  Urine sent for culture  Depending on results may have to d/c hctz and/or dec metformin       Relevant Orders   POCT  Urinalysis Dipstick (Automated) (Completed)   Urine Culture    Other Visit Diagnoses    Abnormal urinalysis       Relevant Orders   Urine Culture

## 2020-07-24 NOTE — Assessment & Plan Note (Signed)
Lab Results  Component Value Date   HGBA1C 6.0 07/19/2020   Excellent control with good diet and metformin and glipizide May need to cut metformin if GFR remains low today)

## 2020-07-25 NOTE — Addendum Note (Signed)
Addended by: Tammi Sou on: 07/25/2020 12:46 PM   Modules accepted: Orders

## 2020-07-26 ENCOUNTER — Other Ambulatory Visit: Payer: Self-pay | Admitting: Family Medicine

## 2020-07-26 LAB — URINE CULTURE
MICRO NUMBER:: 11404718
SPECIMEN QUALITY:: ADEQUATE

## 2020-07-26 MED ORDER — CEPHALEXIN 500 MG PO CAPS: 500.0000 mg | ORAL_CAPSULE | Freq: Two times a day (BID) | ORAL | 0 refills | Status: DC

## 2020-07-26 NOTE — Telephone Encounter (Signed)
E coli uti  Pended keflex for pharmacy of choice   Re check culture a week after finishing please

## 2020-07-26 NOTE — Telephone Encounter (Signed)
Pt notified of positive urine cx. Rx sent to pharmacy. Pt will leave another urine sample 1 week after finishing abx

## 2020-08-08 ENCOUNTER — Ambulatory Visit (INDEPENDENT_AMBULATORY_CARE_PROVIDER_SITE_OTHER): Payer: Medicare HMO | Admitting: Family Medicine

## 2020-08-08 ENCOUNTER — Encounter: Payer: Self-pay | Admitting: Family Medicine

## 2020-08-08 ENCOUNTER — Other Ambulatory Visit: Payer: Self-pay

## 2020-08-08 VITALS — BP 138/78 | HR 112 | Temp 97.8°F | Ht 59.5 in | Wt 159.0 lb

## 2020-08-08 DIAGNOSIS — R944 Abnormal results of kidney function studies: Secondary | ICD-10-CM | POA: Diagnosis not present

## 2020-08-08 DIAGNOSIS — D649 Anemia, unspecified: Secondary | ICD-10-CM

## 2020-08-08 DIAGNOSIS — I10 Essential (primary) hypertension: Secondary | ICD-10-CM

## 2020-08-08 DIAGNOSIS — E119 Type 2 diabetes mellitus without complications: Secondary | ICD-10-CM | POA: Diagnosis not present

## 2020-08-08 LAB — CBC WITH DIFFERENTIAL/PLATELET
Basophils Absolute: 0 10*3/uL (ref 0.0–0.1)
Basophils Relative: 0.8 % (ref 0.0–3.0)
Eosinophils Absolute: 0.4 10*3/uL (ref 0.0–0.7)
Eosinophils Relative: 6.8 % — ABNORMAL HIGH (ref 0.0–5.0)
HCT: 27.7 % — ABNORMAL LOW (ref 36.0–46.0)
Hemoglobin: 9.2 g/dL — ABNORMAL LOW (ref 12.0–15.0)
Lymphocytes Relative: 27.5 % (ref 12.0–46.0)
Lymphs Abs: 1.5 10*3/uL (ref 0.7–4.0)
MCHC: 33 g/dL (ref 30.0–36.0)
MCV: 93 fl (ref 78.0–100.0)
Monocytes Absolute: 0.4 10*3/uL (ref 0.1–1.0)
Monocytes Relative: 8 % (ref 3.0–12.0)
Neutro Abs: 3 10*3/uL (ref 1.4–7.7)
Neutrophils Relative %: 56.9 % (ref 43.0–77.0)
Platelets: 627 10*3/uL — ABNORMAL HIGH (ref 150.0–400.0)
RBC: 2.98 Mil/uL — ABNORMAL LOW (ref 3.87–5.11)
RDW: 15.4 % (ref 11.5–15.5)
WBC: 5.3 10*3/uL (ref 4.0–10.5)

## 2020-08-08 LAB — BASIC METABOLIC PANEL
BUN: 19 mg/dL (ref 6–23)
CO2: 26 mEq/L (ref 19–32)
Calcium: 9.7 mg/dL (ref 8.4–10.5)
Chloride: 102 mEq/L (ref 96–112)
Creatinine, Ser: 1.23 mg/dL — ABNORMAL HIGH (ref 0.40–1.20)
GFR: 44.83 mL/min — ABNORMAL LOW (ref >60.00)
Glucose, Bld: 89 mg/dL (ref 70–99)
Potassium: 4.5 mEq/L (ref 3.5–5.1)
Sodium: 136 mEq/L (ref 135–145)

## 2020-08-08 LAB — IRON: Iron: 19 ug/dL — ABNORMAL LOW (ref 42–145)

## 2020-08-08 MED ORDER — METFORMIN HCL 1000 MG PO TABS: 500.0000 mg | ORAL_TABLET | Freq: Two times a day (BID) | ORAL | 0 refills | Status: DC

## 2020-08-08 NOTE — Assessment & Plan Note (Signed)
D/c hctz at last visit for renal insufficiency  bp in fair control at this time  BP Readings from Last 1 Encounters:  08/08/20 138/78  still controlled  No changes needed Most recent labs reviewed  Disc lifstyle change with low sodium diet and exercise  Plan to continue losartan 50 mg daily

## 2020-08-08 NOTE — Assessment & Plan Note (Signed)
With iron level of 27 Hb 10.0  Last visit we cut iron due to nausea and now Hb is lower  Re check today May need heme ref for IV iron in the future

## 2020-08-08 NOTE — Assessment & Plan Note (Addendum)
Last 34.5cr of 1.53 Since then inst to inc water intake  hctz was stopped and metformin cut in 1/2  Also uti (e coli) wat tx with keflex inst to avoid nsaids  Lab today

## 2020-08-08 NOTE — Progress Notes (Signed)
Subjective:    Patient ID: Martha Hamilton, female    DOB: October 15, 1950, 70 y.o.   MRN: [id number]  This visit occurred during the SARS-CoV-2 public health emergency.  Safety protocols were in place, including screening questions prior to the visit, additional usage of staff PPE, and extensive cleaning of exam room while observing appropriate contact time as indicated for disinfecting solutions.    HPI Pt presents for f/u of chronic health problems   Wt Readings from Last 3 Encounters:  08/08/20 159 lb (72.1 kg)  07/24/20 159 lb (72.1 kg)  01/20/20 163 lb (73.9 kg)   31.58 kg/m   BP was up last time  BP Readings from Last 3 Encounters:  08/08/20 138/78  07/24/20 135/80  01/20/20 136/78   Pulse Readings from Last 3 Encounters:  08/08/20 (!) 112  07/24/20 (!) 105  01/20/20 100    Some stress   Last visit GFR was down as well  Turned out she had uti and was tx with keflex  Lab Results  Component Value Date   CREATININE 1.53 (H) 07/24/2020   BUN 27 (H) 07/24/2020   NA 136 07/24/2020   K 4.2 07/24/2020   CL 101 07/24/2020   CO2 25 07/24/2020   GFR 34.5 inst to cut metformin dose in 1/2 to 500 bid Hold her hctz (ok w/o swelling)  Needs re check today  inst to drink more water  With less metformin glucose is fine    Anemia  Lab Results  Component Value Date   WBC 5.7 07/24/2020   HGB 10.0 (L) 07/24/2020   HCT 29.8 (L) 07/24/2020   MCV 96.0 07/24/2020   PLT 523.0 (H) 07/24/2020   Iron level of 27   She had cut her iron back before that due to nausea  Still not taking iron  No longer nausea   Patient Active Problem List   Diagnosis Date Noted  . Nausea 07/24/2020  . Decreased GFR 07/24/2020  . Tachycardia 01/11/2019  . Fatty liver 06/21/2018  . Obesity (BMI 30-39.9) 06/21/2018  . Dyspepsia 05/25/2018  . GERD (gastroesophageal reflux disease) 05/25/2018  . H/O vertigo 05/11/2018  . Pre-op examination 01/22/2018  . Thrombocytosis 12/25/2017  . Tear  of medial meniscus of knee 12/21/2017  . Left knee pain 09/21/2017  . Low back pain 07/06/2017  . Welcome to Medicare preventive visit 12/17/2016  . Grief reaction 12/17/2016  . Estrogen deficiency 09/08/2016  . Muscle pain 02/08/2016  . Anemia 02/09/2013  . Abnormal EKG 07/27/2012  . Special screening for malignant neoplasms, colon 04/29/2011  . Gynecological examination 04/29/2011  . Routine general medical examination at a health care facility 04/20/2011  . OSTEOARTHRITIS, HANDS, BILATERAL 10/31/2008  . Diabetes type 2, controlled ([place]) 07/10/2008  . Hyperlipidemia associated with type 2 diabetes mellitus ([place]) 01/26/2008  . Essential hypertension 01/26/2008  . ALLERGIC RHINITIS 12/07/2007   Past Medical History:  Diagnosis Date  . Allergic rhinitis   . Breast cancer ([place]) 1996   right breast  . DM2 (diabetes mellitus, type 2) ([place])   . GERD (gastroesophageal reflux disease)   . Hx of cardiovascular stress test    a. Lex MV 2/14:  EF 69%, no ischemia  . Hyperlipidemia   . Hypertension   . Iron deficiency anemia   . Obesity   . Osteoarthritis    hands  . Ulcer    Past Surgical History:  Procedure Laterality Date  . APPENDECTOMY  1976  . BREAST LUMPECTOMY Right  1996   right breast, lumpectomy w/nodes  . CHONDROPLASTY Left 02/10/2018   Procedure: CHONDROPLASTY;  Surgeon: Lovell Sheehan, MD;  Location: [facility];  Service: Orthopedics;  Laterality: Left;  Marland Kitchen GASTRIC RESTRICTION SURGERY     for reflux  . HYSTEROSCOPY WITH D & C N/A 10/18/2012   Procedure: DILATATION AND CURETTAGE /HYSTEROSCOPY;  Surgeon: Mora Bellman, MD;  Location: [facility];  Service: Gynecology;  Laterality: N/A;  . KNEE ARTHROSCOPY WITH MEDIAL MENISECTOMY Left 02/10/2018   Procedure: KNEE ARTHROSCOPY WITH MEDIAL and LATERAL MENISECTOMY;  Surgeon: Lovell Sheehan, MD;  Location: [facility];  Service: Orthopedics;  Laterality: Left;  . POLYPECTOMY N/A 10/18/2012   Procedure: POLYPECTOMY;  Surgeon: Mora Bellman, MD;  Location: [facility];  Service: Gynecology;  Laterality: N/A;  . SYNOVECTOMY Left 02/10/2018   Procedure: SYNOVECTOMY;  Surgeon: Lovell Sheehan, MD;  Location: [facility];  Service: Orthopedics;  Laterality: Left;   Social History   Tobacco Use  . Smoking status: Never Smoker  . Smokeless tobacco: Never Used  Vaping Use  . Vaping Use: Never used  Substance Use Topics  . Alcohol use: Never    Alcohol/week: 0.0 standard drinks  . Drug use: No   Family History  Problem Relation Age of Onset  . Diabetes Mother   . Heart disease Mother        Pacemaker, CHF  . Hypertension Mother   . Kidney cancer Mother   . Stroke Father   . CAD Father 63       Died with MI  . Esophageal cancer Brother   . Heart disease Sister   . Colon cancer Neg Hx   . Rectal cancer Neg Hx   . Stomach cancer Neg Hx   . Pancreatic cancer Neg Hx    Allergies  Allergen Reactions  . Cholestyramine Other (See Comments)    REACTION: Muscles tightened up, drew up.  . Lipitor [Atorvastatin] Other (See Comments)    Muscle cramping  . Niacin Nausea And Vomiting  . Simvastatin     Muscular pain   Current Outpatient Medications on File Prior to Visit  Medication Sig Dispense Refill  . Accu-Chek Softclix Lancets lancets TEST BLOOD SUGAR EVERY DAY FOR DIABETES 100 each 3  . acetaminophen (TYLENOL) 500 MG tablet Take 1,000 mg by mouth every 6 (six) hours as needed for mild pain.     . Alcohol Swabs (B-D SINGLE USE SWABS REGULAR) PADS USE ONE TIME DAILY AS DIRECTED WHEN CHECKING BLOOD SUGAR 100 each 3  . amitriptyline (ELAVIL) 25 MG tablet TAKE 1 TABLET AT BEDTIME 90 tablet 3  . aspirin 81 MG tablet Take 81 mg by mouth at bedtime.     . Blood Glucose Calibration (ACCU-CHEK AVIVA) SOLN Use to calibrate meter 3 each 3  . Blood Glucose Monitoring Suppl (ACCU-CHEK AVIVA PLUS) w/Device KIT Use to check blood sugar once daily for DM (dx. E11.9) 1 kit 0  . calcium elemental as carbonate (BARIATRIC TUMS ULTRA) 400  MG chewable tablet Chew 1,000 mg by mouth daily as needed for heartburn.    . Cholecalciferol (VITAMIN D) 2000 units tablet Take 2,000 Units by mouth daily.    . fenofibrate (TRICOR) 145 MG tablet Take 1 tablet (145 mg total) by mouth daily. 90 tablet 3  . glipiZIDE (GLUCOTROL XL) 2.5 MG 24 hr tablet TAKE 1 TABLET EVERY EVENING 90 tablet 3  . glucose blood (ACCU-CHEK AVIVA PLUS) test strip TEST BLOOD SUGAR EVERY DAY FOR DIABETES (  Dx. E11.9) 100 strip 1  . loratadine (CLARITIN) 10 MG tablet Take 10 mg by mouth daily as needed for allergies.    Marland Kitchen losartan (COZAAR) 50 MG tablet TAKE 1 TABLET EVERY DAY 90 tablet 3  . meclizine (ANTIVERT) 25 MG tablet Take 1 tablet (25 mg total) by mouth 3 (three) times daily as needed for dizziness. 90 tablet 3  . omeprazole (PRILOSEC) 40 MG capsule TAKE 1 CAPSULE IN THE MORNING AND AT BEDTIME. 180 capsule 1  . ondansetron (ZOFRAN) 4 MG tablet Take 1 tablet (4 mg total) by mouth every 4 (four) hours as needed for nausea or vomiting. 90 tablet 2  . SALINE NASAL SPRAY NA Place 1 spray into the nose daily as needed (congestion).      No current facility-administered medications on file prior to visit.    Review of Systems  Constitutional: Negative for activity change, appetite change, fatigue, fever and unexpected weight change.  HENT: Negative for congestion, ear pain, rhinorrhea, sinus pressure and sore throat.   Eyes: Negative for pain, redness and visual disturbance.  Respiratory: Negative for cough, shortness of breath and wheezing.   Cardiovascular: Negative for chest pain and palpitations.  Gastrointestinal: Negative for abdominal pain, blood in stool, constipation, diarrhea and nausea.       No more nausea  Much better   Endocrine: Negative for polydipsia and polyuria.  Genitourinary: Negative for dysuria, frequency and urgency.  Musculoskeletal: Negative for arthralgias, back pain and myalgias.  Skin: Negative for pallor and rash.  Allergic/Immunologic:  Negative for environmental allergies.  Neurological: Negative for dizziness, syncope and headaches.  Hematological: Negative for adenopathy. Does not bruise/bleed easily.  Psychiatric/Behavioral: Negative for decreased concentration and dysphoric mood. The patient is not nervous/anxious.        Objective:   Physical Exam Constitutional:      General: She is not in acute distress.    Appearance: Normal appearance. She is well-developed and well-nourished. She is obese. She is not ill-appearing.  HENT:     Head: Normocephalic and atraumatic.     Mouth/Throat:     Mouth: Oropharynx is clear and moist.  Eyes:     Extraocular Movements: EOM normal.     Conjunctiva/sclera: Conjunctivae normal.     Pupils: Pupils are equal, round, and reactive to light.  Neck:     Thyroid: No thyromegaly.     Vascular: No carotid bruit or JVD.  Cardiovascular:     Rate and Rhythm: Normal rate and regular rhythm.     Pulses: Normal pulses and intact distal pulses.     Heart sounds: Normal heart sounds. No gallop.   Pulmonary:     Effort: Pulmonary effort is normal. No respiratory distress.     Breath sounds: Normal breath sounds. No wheezing or rales.     Comments: No crackles Abdominal:     General: Bowel sounds are normal. There is no distension or abdominal bruit.     Palpations: Abdomen is soft. There is no mass.     Tenderness: There is no abdominal tenderness. There is no right CVA tenderness or left CVA tenderness.     Comments: No suprapubic tenderness or fullness    No renal bruits  Musculoskeletal:        General: No edema.     Cervical back: Normal range of motion and neck supple.     Right lower leg: No edema.     Left lower leg: No edema.  Lymphadenopathy:  Cervical: No cervical adenopathy.  Skin:    General: Skin is warm and dry.     Coloration: Skin is not pale.     Findings: No erythema or rash.  Neurological:     Mental Status: She is alert.     Deep Tendon Reflexes:  Reflexes are normal and symmetric.  Psychiatric:        Mood and Affect: Mood and affect and mood normal.           Assessment & Plan:   Problem List Items Addressed This Visit      Cardiovascular and Mediastinum   Essential hypertension    D/c hctz at last visit for renal insufficiency  bp in fair control at this time  BP Readings from Last 1 Encounters:  08/08/20 138/78  still controlled  No changes needed Most recent labs reviewed  Disc lifstyle change with low sodium diet and exercise  Plan to continue losartan 50 mg daily        Endocrine   Diabetes type 2, controlled ([place])    Last visit cut metformin dose down to 500 mg bid  Lab Results  Component Value Date   HGBA1C 6.0 07/19/2020   Per pt -not much change in glucose readings since the decrease  Renal panel today  If no imp will need to cut metformin alltogether       Relevant Medications   metFORMIN (GLUCOPHAGE) 1000 MG tablet     Other   Anemia    With iron level of 27 Hb 10.0  Last visit we cut iron due to nausea and now Hb is lower  Re check today May need heme ref for IV iron in the future      Relevant Orders   CBC with Differential/Platelet (Completed)   Iron (Completed)   Decreased GFR - Primary    Last 34.5cr of 1.53 Since then inst to inc water intake  hctz was stopped and metformin cut in 1/2  Also uti (e coli) wat tx with keflex inst to avoid nsaids  Lab today       Relevant Orders   Basic metabolic panel (Completed)

## 2020-08-08 NOTE — Patient Instructions (Signed)
I don't know if your nausea came from uti or from iron intolerance  Labs today  BP is good  Glucose is still controlled Continue current doses of medicine for now   We will make a plan when labs return

## 2020-08-08 NOTE — Assessment & Plan Note (Signed)
Last visit cut metformin dose down to 500 mg bid  Lab Results  Component Value Date   HGBA1C 6.0 07/19/2020   Per pt -not much change in glucose readings since the decrease  Renal panel today  If no imp will need to cut metformin alltogether

## 2020-08-09 ENCOUNTER — Telehealth: Payer: Self-pay | Admitting: Family Medicine

## 2020-08-09 DIAGNOSIS — D649 Anemia, unspecified: Secondary | ICD-10-CM

## 2020-08-09 NOTE — Telephone Encounter (Signed)
Referral done

## 2020-08-27 ENCOUNTER — Encounter: Payer: Self-pay | Admitting: Oncology

## 2020-08-27 ENCOUNTER — Inpatient Hospital Stay: Payer: Medicare HMO | Attending: Oncology | Admitting: Oncology

## 2020-08-27 ENCOUNTER — Inpatient Hospital Stay: Payer: Medicare HMO

## 2020-08-27 VITALS — BP 156/95 | HR 114 | Temp 100.5°F | Resp 18 | Ht 59.5 in | Wt 161.6 lb

## 2020-08-27 DIAGNOSIS — Z8051 Family history of malignant neoplasm of kidney: Secondary | ICD-10-CM | POA: Diagnosis not present

## 2020-08-27 DIAGNOSIS — Z8249 Family history of ischemic heart disease and other diseases of the circulatory system: Secondary | ICD-10-CM | POA: Insufficient documentation

## 2020-08-27 DIAGNOSIS — D508 Other iron deficiency anemias: Secondary | ICD-10-CM | POA: Diagnosis not present

## 2020-08-27 DIAGNOSIS — I10 Essential (primary) hypertension: Secondary | ICD-10-CM | POA: Diagnosis not present

## 2020-08-27 DIAGNOSIS — Z809 Family history of malignant neoplasm, unspecified: Secondary | ICD-10-CM | POA: Insufficient documentation

## 2020-08-27 DIAGNOSIS — D75839 Thrombocytosis, unspecified: Secondary | ICD-10-CM | POA: Diagnosis not present

## 2020-08-27 DIAGNOSIS — D649 Anemia, unspecified: Secondary | ICD-10-CM

## 2020-08-27 DIAGNOSIS — Z853 Personal history of malignant neoplasm of breast: Secondary | ICD-10-CM | POA: Diagnosis not present

## 2020-08-27 DIAGNOSIS — E785 Hyperlipidemia, unspecified: Secondary | ICD-10-CM | POA: Diagnosis not present

## 2020-08-27 DIAGNOSIS — Z79899 Other long term (current) drug therapy: Secondary | ICD-10-CM | POA: Diagnosis not present

## 2020-08-27 DIAGNOSIS — E119 Type 2 diabetes mellitus without complications: Secondary | ICD-10-CM | POA: Insufficient documentation

## 2020-08-27 DIAGNOSIS — D509 Iron deficiency anemia, unspecified: Secondary | ICD-10-CM

## 2020-08-27 DIAGNOSIS — E538 Deficiency of other specified B group vitamins: Secondary | ICD-10-CM | POA: Diagnosis not present

## 2020-08-27 DIAGNOSIS — Z802 Family history of malignant neoplasm of other respiratory and intrathoracic organs: Secondary | ICD-10-CM | POA: Diagnosis not present

## 2020-08-27 DIAGNOSIS — Z833 Family history of diabetes mellitus: Secondary | ICD-10-CM | POA: Insufficient documentation

## 2020-08-27 HISTORY — DX: Iron deficiency anemia, unspecified: D50.9

## 2020-08-27 LAB — COMPREHENSIVE METABOLIC PANEL
ALT: 20 U/L (ref 0–44)
AST: 27 U/L (ref 15–41)
Albumin: 3.8 g/dL (ref 3.5–5.0)
Alkaline Phosphatase: 35 U/L — ABNORMAL LOW (ref 38–126)
Anion gap: 12 (ref 5–15)
BUN: 26 mg/dL — ABNORMAL HIGH (ref 8–23)
CO2: 21 mmol/L — ABNORMAL LOW (ref 22–32)
Calcium: 9.5 mg/dL (ref 8.9–10.3)
Chloride: 103 mmol/L (ref 98–111)
Creatinine, Ser: 1.36 mg/dL — ABNORMAL HIGH (ref 0.44–1.00)
GFR, Estimated: 42 mL/min — ABNORMAL LOW (ref >60)
Glucose, Bld: 108 mg/dL — ABNORMAL HIGH (ref 70–99)
Potassium: 4.8 mmol/L (ref 3.5–5.1)
Sodium: 136 mmol/L (ref 135–145)
Total Bilirubin: 0.4 mg/dL (ref 0.3–1.2)
Total Protein: 7.9 g/dL (ref 6.5–8.1)

## 2020-08-27 LAB — CBC WITH DIFFERENTIAL/PLATELET
Abs Immature Granulocytes: 0.05 10*3/uL (ref 0.00–0.07)
Basophils Absolute: 0.1 10*3/uL (ref 0.0–0.1)
Basophils Relative: 1 %
Eosinophils Absolute: 0.3 10*3/uL (ref 0.0–0.5)
Eosinophils Relative: 4 %
HCT: 27.8 % — ABNORMAL LOW (ref 36.0–46.0)
Hemoglobin: 8.9 g/dL — ABNORMAL LOW (ref 12.0–15.0)
Immature Granulocytes: 1 %
Lymphocytes Relative: 25 %
Lymphs Abs: 2.3 10*3/uL (ref 0.7–4.0)
MCH: 27.5 pg (ref 26.0–34.0)
MCHC: 32 g/dL (ref 30.0–36.0)
MCV: 85.8 fL (ref 80.0–100.0)
Monocytes Absolute: 0.6 10*3/uL (ref 0.1–1.0)
Monocytes Relative: 7 %
Neutro Abs: 5.9 10*3/uL (ref 1.7–7.7)
Neutrophils Relative %: 62 %
Platelets: 666 10*3/uL — ABNORMAL HIGH (ref 150–400)
RBC: 3.24 MIL/uL — ABNORMAL LOW (ref 3.87–5.11)
RDW: 15.6 % — ABNORMAL HIGH (ref 11.5–15.5)
WBC: 9.3 10*3/uL (ref 4.0–10.5)
nRBC: 0 % (ref 0.0–0.2)

## 2020-08-27 LAB — TECHNOLOGIST SMEAR REVIEW: Plt Morphology: NORMAL

## 2020-08-27 LAB — FOLATE: Folate: 60 ng/mL (ref >5.9)

## 2020-08-27 LAB — IRON AND TIBC
Iron: 20 ug/dL — ABNORMAL LOW (ref 28–170)
Saturation Ratios: 4 % — ABNORMAL LOW (ref 10.4–31.8)
TIBC: 580 ug/dL — ABNORMAL HIGH (ref 250–450)
UIBC: 560 ug/dL

## 2020-08-27 LAB — RETIC PANEL
Immature Retic Fract: 24.2 % — ABNORMAL HIGH (ref 2.3–15.9)
RBC.: 3.19 MIL/uL — ABNORMAL LOW (ref 3.87–5.11)
Retic Count, Absolute: 52 10*3/uL (ref 19.0–186.0)
Retic Ct Pct: 1.6 % (ref 0.4–3.1)
Reticulocyte Hemoglobin: 22.2 pg — ABNORMAL LOW (ref >27.9)

## 2020-08-27 LAB — LACTATE DEHYDROGENASE: LDH: 104 U/L (ref 98–192)

## 2020-08-27 LAB — VITAMIN B12: Vitamin B-12: <50 pg/mL — ABNORMAL LOW (ref 180–914)

## 2020-08-27 LAB — FERRITIN: Ferritin: 7 ng/mL — ABNORMAL LOW (ref 11–307)

## 2020-08-27 NOTE — Progress Notes (Signed)
Hematology/Oncology Consult note [facility] [phone] [phone]   Patient Care Team: Tower, Wynelle Fanny, MD as PCP - General  REFERRING PROVIDER: Tower, Wynelle Fanny, MD  CHIEF COMPLAINTS/REASON FOR VISIT:  Evaluation of anemia  HISTORY OF PRESENTING ILLNESS:  Martha Hamilton is a  70 y.o.  female with PMH listed below who was referred to me for evaluation of anemia Reviewed patient's recent labs that was done.  08/08/20 Labs revealed anemia with hemoglobin of 9.2, MCV 93 .   Reviewed patient's previous labs ordered by primary care physician's office, anemia is chronic onset , duration is since 2013, with baseline in 11s, worsened during the recent 6 months, after she stops taking oral iron supplementation.   Associated signs and symptoms: Patient reports fatigue. denies SOB with exertion.  Denies weight loss, easy bruising, hematochezia, hemoptysis, hematuria. Context: History of GI bleeding: deneis               History of gastric restriction surgery for GERD                Last colonoscopy: 2014. Last Endoscopy 2020.  Remote history of right breast cancer, s/p lumpectomy with node. She denies any previous chemotherapy or any anti estrogen therapy.     Family history positive for brother with esophageal cancer, mother with RCC.   She feels fatigued and craves for ice chips.   Review of Systems  Constitutional: Positive for fatigue. Negative for appetite change, chills and fever.  HENT:   Negative for hearing loss and voice change.   Eyes: Negative for eye problems.  Respiratory: Negative for chest tightness and cough.   Cardiovascular: Negative for chest pain.  Gastrointestinal: Negative for abdominal distention, abdominal pain and blood in stool.  Endocrine: Negative for hot flashes.  Genitourinary: Negative for difficulty urinating and frequency.   Musculoskeletal: Negative for arthralgias.  Skin: Negative for itching and rash.   Neurological: Negative for extremity weakness.  Hematological: Negative for adenopathy.  Psychiatric/Behavioral: Negative for confusion.     MEDICAL HISTORY:  Past Medical History:  Diagnosis Date  . Allergic rhinitis   . Breast cancer ([place]) 1996   right breast  . DM2 (diabetes mellitus, type 2) ([place])   . GERD (gastroesophageal reflux disease)   . Hx of cardiovascular stress test    a. Lex MV 2/14:  EF 69%, no ischemia  . Hyperlipidemia   . Hypertension   . Iron deficiency anemia   . Obesity   . Osteoarthritis    hands  . Ulcer     SURGICAL HISTORY: Past Surgical History:  Procedure Laterality Date  . APPENDECTOMY  1976  . BREAST LUMPECTOMY Right 1996   right breast, lumpectomy w/nodes  . CHONDROPLASTY Left 02/10/2018   Procedure: CHONDROPLASTY;  Surgeon: Lovell Sheehan, MD;  Location: [facility];  Service: Orthopedics;  Laterality: Left;  Marland Kitchen GASTRIC RESTRICTION SURGERY     for reflux  . HYSTEROSCOPY WITH D & C N/A 10/18/2012   Procedure: DILATATION AND CURETTAGE /HYSTEROSCOPY;  Surgeon: Mora Bellman, MD;  Location: [place];  Service: Gynecology;  Laterality: N/A;  . KNEE ARTHROSCOPY WITH MEDIAL MENISECTOMY Left 02/10/2018   Procedure: KNEE ARTHROSCOPY WITH MEDIAL and LATERAL MENISECTOMY;  Surgeon: Lovell Sheehan, MD;  Location: [facility];  Service: Orthopedics;  Laterality: Left;  . POLYPECTOMY N/A 10/18/2012   Procedure: POLYPECTOMY;  Surgeon: Mora Bellman, MD;  Location: [facility];  Service: Gynecology;  Laterality: N/A;  . SYNOVECTOMY Left 02/10/2018  Procedure: SYNOVECTOMY;  Surgeon: Lovell Sheehan, MD;  Location: [facility];  Service: Orthopedics;  Laterality: Left;    SOCIAL HISTORY: Social History   Socioeconomic History  . Marital status: Single    Spouse name: Not on file  . Number of children: 0  . Years of education: Not on file  . Highest education level: Not on file  Occupational History  . Occupation: retired    Fish farm manager: RETIRED  Tobacco Use  .  Smoking status: Never Smoker  . Smokeless tobacco: Never Used  Vaping Use  . Vaping Use: Never used  Substance and Sexual Activity  . Alcohol use: Never    Alcohol/week: 0.0 standard drinks  . Drug use: No  . Sexual activity: Not Currently    Birth control/protection: Post-menopausal  Other Topics Concern  . Not on file  Social History Narrative   Lives with, and cares for her elderly mother.   Social Determinants of Health   Financial Resource Strain: Not on file  Food Insecurity: Not on file  Transportation Needs: Not on file  Physical Activity: Not on file  Stress: Not on file  Social Connections: Not on file  Intimate Partner Violence: Not on file    FAMILY HISTORY: Family History  Problem Relation Age of Onset  . Diabetes Mother   . Heart disease Mother        Pacemaker, CHF  . Hypertension Mother   . Kidney cancer Mother   . Stroke Father   . CAD Father 33       Died with MI  . Esophageal cancer Brother   . Heart disease Sister   . Colon cancer Neg Hx   . Rectal cancer Neg Hx   . Stomach cancer Neg Hx   . Pancreatic cancer Neg Hx     ALLERGIES:  is allergic to cholestyramine, lipitor [atorvastatin], niacin, and simvastatin.  MEDICATIONS:  Current Outpatient Medications  Medication Sig Dispense Refill  . Accu-Chek Softclix Lancets lancets TEST BLOOD SUGAR EVERY DAY FOR DIABETES 100 each 3  . acetaminophen (TYLENOL) 500 MG tablet Take 1,000 mg by mouth every 6 (six) hours as needed for mild pain.     . Alcohol Swabs (B-D SINGLE USE SWABS REGULAR) PADS USE ONE TIME DAILY AS DIRECTED WHEN CHECKING BLOOD SUGAR 100 each 3  . amitriptyline (ELAVIL) 25 MG tablet TAKE 1 TABLET AT BEDTIME 90 tablet 3  . aspirin 81 MG tablet Take 81 mg by mouth at bedtime.     . Blood Glucose Calibration (ACCU-CHEK AVIVA) SOLN Use to calibrate meter 3 each 3  . Blood Glucose Monitoring Suppl (ACCU-CHEK AVIVA PLUS) w/Device KIT Use to check blood sugar once daily for DM (dx. E11.9) 1  kit 0  . calcium elemental as carbonate (BARIATRIC TUMS ULTRA) 400 MG chewable tablet Chew 1,000 mg by mouth daily as needed for heartburn.    . Cholecalciferol (VITAMIN D) 2000 units tablet Take 2,000 Units by mouth daily.    . fenofibrate (TRICOR) 145 MG tablet Take 1 tablet (145 mg total) by mouth daily. 90 tablet 3  . glipiZIDE (GLUCOTROL XL) 2.5 MG 24 hr tablet TAKE 1 TABLET EVERY EVENING 90 tablet 3  . loratadine (CLARITIN) 10 MG tablet Take 10 mg by mouth daily as needed for allergies.    Marland Kitchen losartan (COZAAR) 50 MG tablet TAKE 1 TABLET EVERY DAY 90 tablet 3  . meclizine (ANTIVERT) 25 MG tablet Take 1 tablet (25 mg total) by mouth 3 (three) times  daily as needed for dizziness. 90 tablet 3  . metFORMIN (GLUCOPHAGE) 1000 MG tablet Take 0.5 tablets (500 mg total) by mouth 2 (two) times daily with a meal. TAKE 1/2 (524m) TABLET TWICE DAILY WITH A MEAL 1 tablet 0  . omeprazole (PRILOSEC) 40 MG capsule TAKE 1 CAPSULE IN THE MORNING AND AT BEDTIME. 180 capsule 1  . ondansetron (ZOFRAN) 4 MG tablet Take 1 tablet (4 mg total) by mouth every 4 (four) hours as needed for nausea or vomiting. 90 tablet 2  . SALINE NASAL SPRAY NA Place 1 spray into the nose daily as needed (congestion).     .Marland Kitchenglucose blood (ACCU-CHEK AVIVA PLUS) test strip TEST BLOOD SUGAR EVERY DAY FOR DIABETES (Dx. E11.9) 100 strip 1   No current facility-administered medications for this visit.     PHYSICAL EXAMINATION: ECOG PERFORMANCE STATUS: 1 - Symptomatic but completely ambulatory Vitals:   08/27/20 1514  BP: (!) 156/95  Pulse: (!) 114  Resp: 18  Temp: (!) 100.5 F (38.1 C)   Filed Weights   08/27/20 1514  Weight: 161 lb 9.6 oz (73.3 kg)    Physical Exam Constitutional:      General: She is not in acute distress. HENT:     Head: Normocephalic and atraumatic.  Eyes:     General: No scleral icterus. Cardiovascular:     Rate and Rhythm: Normal rate and regular rhythm.     Heart sounds: Normal heart sounds.   Pulmonary:     Effort: Pulmonary effort is normal. No respiratory distress.     Breath sounds: No wheezing.  Abdominal:     General: Bowel sounds are normal. There is no distension.     Palpations: Abdomen is soft.  Musculoskeletal:        General: No deformity. Normal range of motion.     Cervical back: Normal range of motion and neck supple.  Skin:    General: Skin is warm and dry.     Findings: No erythema or rash.  Neurological:     Mental Status: She is alert and oriented to person, place, and time. Mental status is at baseline.     Cranial Nerves: No cranial nerve deficit.     Coordination: Coordination normal.  Psychiatric:        Mood and Affect: Mood normal.      LABORATORY DATA:  I have reviewed the data as listed Lab Results  Component Value Date   WBC 9.3 08/27/2020   HGB 8.9 (L) 08/27/2020   HCT 27.8 (L) 08/27/2020   MCV 85.8 08/27/2020   PLT 666 (H) 08/27/2020   Recent Labs    01/13/20 0803 03/02/20 0819 07/19/20 0741 07/24/20 0840 08/08/20 0859 08/27/20 1611  NA 137  --  136 136 136 136  K 3.9  --  4.2 4.2 4.5 4.8  CL 99  --  99 101 102 103  CO2 25  --  _0 21*  GLUCOSE 126*  --  111* 105* 89 108*  BUN 22  --  30* 27* 19 26*  CREATININE 1.13  --  1.68* 1.53* 1.23* 1.36*  CALCIUM 9.4  --  9.7 9.5 9.7 9.5  GFRNONAA  --   --   --   --   --  42*  PROT 7.5  --  7.5  --   --  7.9  ALBUMIN 4.1  --  4.2  --   --  3.8  AST _1 --   --  27  ALT _0 --   --  20  ALKPHOS 38*  --  28*  --   --  35*  BILITOT 0.3  --  0.3  --   --  0.4   Iron/TIBC/Ferritin/ %Sat    Component Value Date/Time   IRON 20 (L) 08/27/2020 1611   TIBC 580 (H) 08/27/2020 1611   FERRITIN 7 (L) 08/27/2020 1611   IRONPCTSAT 4 (L) 08/27/2020 1611        ASSESSMENT & PLAN:  1. Normocytic anemia   2. Thrombocytosis   3. Family history of cancer   4. History of breast cancer     Anemia: Will check CBC w differential, CMP, vitamin B12, Folate, iron/TIBC,  ferritin, reticulocytes,  blood smear, LDH, monoclonal gammopathy evaluation.    Labs were reviewed Hemoglobin is 8.9, iron panel showed ferritin is 7, iron saturation of 4, increased TIBC 580.  Consistent with Iron deficiency anemia. I recommend IV venofer treatments as she is not able to tolerate oral iron. Normal folate  Vitamin B12 deficiency, Vitamin B12 level is <50. Recommend her to start parental vitamin B12 injections.  Thrombocytosis is likely due to severe iron deficiency.   Family history of cancer and personal history of breast cancer, recommend genetic testing.  She will consider and update me if she wants to be referred to genetic counseling.   Follow up in 1-2 weeks to go over results.   Orders Placed This Encounter  Procedures  . Iron and TIBC    Standing Status:   Future    Number of Occurrences:   1    Standing Expiration Date:   08/27/2021  . Ferritin    Standing Status:   Future    Number of Occurrences:   1    Standing Expiration Date:   02/24/2021  . Vitamin B12    Standing Status:   Future    Number of Occurrences:   1    Standing Expiration Date:   08/27/2021  . Folate    Standing Status:   Future    Number of Occurrences:   1    Standing Expiration Date:   08/27/2021  . Comprehensive metabolic panel    Standing Status:   Future    Number of Occurrences:   1    Standing Expiration Date:   08/27/2021  . CBC with Differential/Platelet    Standing Status:   Future    Number of Occurrences:   1    Standing Expiration Date:   08/27/2021  . Technologist smear review    Standing Status:   Future    Number of Occurrences:   1    Standing Expiration Date:   08/27/2021  . Retic Panel    Standing Status:   Future    Number of Occurrences:   1    Standing Expiration Date:   08/27/2021  . Protein electrophoresis, serum    Standing Status:   Future    Number of Occurrences:   1    Standing Expiration Date:   08/27/2021  . Lactate dehydrogenase    Standing  Status:   Future    Number of Occurrences:   1    Standing Expiration Date:   08/27/2021    All questions were answered. The patient knows to call the clinic with any problems questions or concerns. Cc. Tower, Wynelle Fanny, MD  Return of visit: 2 weeks Thank you for this kind referral and the opportunity  to participate in the care of this patient. A copy of today's note is routed to referring provider   Earlie Server, MD, PhD 08/27/2020

## 2020-08-27 NOTE — Progress Notes (Signed)
Pt here to establish care for anemia.  °

## 2020-08-28 ENCOUNTER — Telehealth: Payer: Self-pay

## 2020-08-28 NOTE — Telephone Encounter (Signed)
-----   Message from Earlie Server, MD sent at 08/27/2020 10:29 PM EST ----- Please let her know that her iron level is low and I recommend iron infusion- which I will further discuss with her at next visit. Vitamin B12 level is also low, I recommend her to start B12 injections daily x 5 days.  Some labs are still pending and should be back in a few days.   Please move her virtual visit to next week+ Venofer.   and also schedule her to do B12 injection daily starting this week ASAP.

## 2020-08-28 NOTE — Telephone Encounter (Signed)
Done.. All pts appts has been sched as requested Pt was made aware and will RTC on 2/16 for her 1st b12 Inj

## 2020-08-28 NOTE — Telephone Encounter (Signed)
Patient notified of MD recommendations via [place]. Please move up Mychart visit to next week and add venofer *new* on day 2. Also, schedule B12 inj daily x5 starting this week ASAP, per MD.

## 2020-08-29 ENCOUNTER — Inpatient Hospital Stay: Payer: Medicare HMO

## 2020-08-29 DIAGNOSIS — Z79899 Other long term (current) drug therapy: Secondary | ICD-10-CM | POA: Diagnosis not present

## 2020-08-29 DIAGNOSIS — D509 Iron deficiency anemia, unspecified: Secondary | ICD-10-CM | POA: Diagnosis not present

## 2020-08-29 DIAGNOSIS — D75839 Thrombocytosis, unspecified: Secondary | ICD-10-CM | POA: Diagnosis not present

## 2020-08-29 DIAGNOSIS — E785 Hyperlipidemia, unspecified: Secondary | ICD-10-CM | POA: Diagnosis not present

## 2020-08-29 DIAGNOSIS — I10 Essential (primary) hypertension: Secondary | ICD-10-CM | POA: Diagnosis not present

## 2020-08-29 DIAGNOSIS — Z8051 Family history of malignant neoplasm of kidney: Secondary | ICD-10-CM | POA: Diagnosis not present

## 2020-08-29 DIAGNOSIS — D508 Other iron deficiency anemias: Secondary | ICD-10-CM

## 2020-08-29 DIAGNOSIS — E538 Deficiency of other specified B group vitamins: Secondary | ICD-10-CM | POA: Diagnosis not present

## 2020-08-29 DIAGNOSIS — E119 Type 2 diabetes mellitus without complications: Secondary | ICD-10-CM | POA: Diagnosis not present

## 2020-08-29 DIAGNOSIS — Z853 Personal history of malignant neoplasm of breast: Secondary | ICD-10-CM | POA: Diagnosis not present

## 2020-08-29 LAB — PROTEIN ELECTROPHORESIS, SERUM
A/G Ratio: 0.9 (ref 0.7–1.7)
Albumin ELP: 3.5 g/dL (ref 2.9–4.4)
Alpha-1-Globulin: 0.2 g/dL (ref 0.0–0.4)
Alpha-2-Globulin: 0.9 g/dL (ref 0.4–1.0)
Beta Globulin: 1.6 g/dL — ABNORMAL HIGH (ref 0.7–1.3)
Gamma Globulin: 1 g/dL (ref 0.4–1.8)
Globulin, Total: 3.8 g/dL (ref 2.2–3.9)
Total Protein ELP: 7.3 g/dL (ref 6.0–8.5)

## 2020-08-29 MED ORDER — CYANOCOBALAMIN 1000 MCG/ML IJ SOLN
1000.0000 ug | Freq: Once | INTRAMUSCULAR | Status: AC
  Administered 2020-08-29: 1000 ug via INTRAMUSCULAR
  Filled 2020-08-29: qty 1

## 2020-08-30 ENCOUNTER — Inpatient Hospital Stay: Payer: Medicare HMO

## 2020-08-30 DIAGNOSIS — D508 Other iron deficiency anemias: Secondary | ICD-10-CM

## 2020-08-30 DIAGNOSIS — Z853 Personal history of malignant neoplasm of breast: Secondary | ICD-10-CM | POA: Diagnosis not present

## 2020-08-30 DIAGNOSIS — Z79899 Other long term (current) drug therapy: Secondary | ICD-10-CM | POA: Diagnosis not present

## 2020-08-30 DIAGNOSIS — I10 Essential (primary) hypertension: Secondary | ICD-10-CM | POA: Diagnosis not present

## 2020-08-30 DIAGNOSIS — D75839 Thrombocytosis, unspecified: Secondary | ICD-10-CM | POA: Diagnosis not present

## 2020-08-30 DIAGNOSIS — E538 Deficiency of other specified B group vitamins: Secondary | ICD-10-CM | POA: Diagnosis not present

## 2020-08-30 DIAGNOSIS — E119 Type 2 diabetes mellitus without complications: Secondary | ICD-10-CM | POA: Diagnosis not present

## 2020-08-30 DIAGNOSIS — D509 Iron deficiency anemia, unspecified: Secondary | ICD-10-CM | POA: Diagnosis not present

## 2020-08-30 DIAGNOSIS — E785 Hyperlipidemia, unspecified: Secondary | ICD-10-CM | POA: Diagnosis not present

## 2020-08-30 DIAGNOSIS — Z8051 Family history of malignant neoplasm of kidney: Secondary | ICD-10-CM | POA: Diagnosis not present

## 2020-08-30 MED ORDER — CYANOCOBALAMIN 1000 MCG/ML IJ SOLN
1000.0000 ug | Freq: Once | INTRAMUSCULAR | Status: AC
  Administered 2020-08-30: 1000 ug via INTRAMUSCULAR
  Filled 2020-08-30: qty 1

## 2020-08-31 ENCOUNTER — Inpatient Hospital Stay: Payer: Medicare HMO

## 2020-08-31 DIAGNOSIS — D509 Iron deficiency anemia, unspecified: Secondary | ICD-10-CM | POA: Diagnosis not present

## 2020-08-31 DIAGNOSIS — D75839 Thrombocytosis, unspecified: Secondary | ICD-10-CM | POA: Diagnosis not present

## 2020-08-31 DIAGNOSIS — E119 Type 2 diabetes mellitus without complications: Secondary | ICD-10-CM | POA: Diagnosis not present

## 2020-08-31 DIAGNOSIS — D508 Other iron deficiency anemias: Secondary | ICD-10-CM

## 2020-08-31 DIAGNOSIS — E785 Hyperlipidemia, unspecified: Secondary | ICD-10-CM | POA: Diagnosis not present

## 2020-08-31 DIAGNOSIS — Z79899 Other long term (current) drug therapy: Secondary | ICD-10-CM | POA: Diagnosis not present

## 2020-08-31 DIAGNOSIS — E538 Deficiency of other specified B group vitamins: Secondary | ICD-10-CM | POA: Diagnosis not present

## 2020-08-31 DIAGNOSIS — I10 Essential (primary) hypertension: Secondary | ICD-10-CM | POA: Diagnosis not present

## 2020-08-31 DIAGNOSIS — Z853 Personal history of malignant neoplasm of breast: Secondary | ICD-10-CM | POA: Diagnosis not present

## 2020-08-31 DIAGNOSIS — Z8051 Family history of malignant neoplasm of kidney: Secondary | ICD-10-CM | POA: Diagnosis not present

## 2020-08-31 MED ORDER — CYANOCOBALAMIN 1000 MCG/ML IJ SOLN
1000.0000 ug | Freq: Once | INTRAMUSCULAR | Status: AC
  Administered 2020-08-31: 1000 ug via INTRAMUSCULAR
  Filled 2020-08-31: qty 1

## 2020-09-03 ENCOUNTER — Inpatient Hospital Stay: Payer: Medicare HMO

## 2020-09-03 DIAGNOSIS — E538 Deficiency of other specified B group vitamins: Secondary | ICD-10-CM | POA: Diagnosis not present

## 2020-09-03 DIAGNOSIS — D75839 Thrombocytosis, unspecified: Secondary | ICD-10-CM | POA: Diagnosis not present

## 2020-09-03 DIAGNOSIS — I10 Essential (primary) hypertension: Secondary | ICD-10-CM | POA: Diagnosis not present

## 2020-09-03 DIAGNOSIS — Z8051 Family history of malignant neoplasm of kidney: Secondary | ICD-10-CM | POA: Diagnosis not present

## 2020-09-03 DIAGNOSIS — Z79899 Other long term (current) drug therapy: Secondary | ICD-10-CM | POA: Diagnosis not present

## 2020-09-03 DIAGNOSIS — D509 Iron deficiency anemia, unspecified: Secondary | ICD-10-CM | POA: Diagnosis not present

## 2020-09-03 DIAGNOSIS — E785 Hyperlipidemia, unspecified: Secondary | ICD-10-CM | POA: Diagnosis not present

## 2020-09-03 DIAGNOSIS — E119 Type 2 diabetes mellitus without complications: Secondary | ICD-10-CM | POA: Diagnosis not present

## 2020-09-03 DIAGNOSIS — Z853 Personal history of malignant neoplasm of breast: Secondary | ICD-10-CM | POA: Diagnosis not present

## 2020-09-03 DIAGNOSIS — D508 Other iron deficiency anemias: Secondary | ICD-10-CM

## 2020-09-03 MED ORDER — CYANOCOBALAMIN 1000 MCG/ML IJ SOLN
1000.0000 ug | Freq: Once | INTRAMUSCULAR | Status: AC
  Administered 2020-09-03: 1000 ug via INTRAMUSCULAR
  Filled 2020-09-03: qty 1

## 2020-09-04 ENCOUNTER — Inpatient Hospital Stay: Payer: Medicare HMO

## 2020-09-04 DIAGNOSIS — E119 Type 2 diabetes mellitus without complications: Secondary | ICD-10-CM | POA: Diagnosis not present

## 2020-09-04 DIAGNOSIS — D508 Other iron deficiency anemias: Secondary | ICD-10-CM

## 2020-09-04 DIAGNOSIS — I10 Essential (primary) hypertension: Secondary | ICD-10-CM | POA: Diagnosis not present

## 2020-09-04 DIAGNOSIS — Z8051 Family history of malignant neoplasm of kidney: Secondary | ICD-10-CM | POA: Diagnosis not present

## 2020-09-04 DIAGNOSIS — E538 Deficiency of other specified B group vitamins: Secondary | ICD-10-CM | POA: Diagnosis not present

## 2020-09-04 DIAGNOSIS — E785 Hyperlipidemia, unspecified: Secondary | ICD-10-CM | POA: Diagnosis not present

## 2020-09-04 DIAGNOSIS — Z853 Personal history of malignant neoplasm of breast: Secondary | ICD-10-CM | POA: Diagnosis not present

## 2020-09-04 DIAGNOSIS — Z79899 Other long term (current) drug therapy: Secondary | ICD-10-CM | POA: Diagnosis not present

## 2020-09-04 DIAGNOSIS — D509 Iron deficiency anemia, unspecified: Secondary | ICD-10-CM | POA: Diagnosis not present

## 2020-09-04 DIAGNOSIS — D75839 Thrombocytosis, unspecified: Secondary | ICD-10-CM | POA: Diagnosis not present

## 2020-09-04 MED ORDER — CYANOCOBALAMIN 1000 MCG/ML IJ SOLN
1000.0000 ug | Freq: Once | INTRAMUSCULAR | Status: AC
  Administered 2020-09-04: 1000 ug via INTRAMUSCULAR
  Filled 2020-09-04: qty 1

## 2020-09-06 ENCOUNTER — Encounter: Payer: Self-pay | Admitting: Oncology

## 2020-09-06 ENCOUNTER — Inpatient Hospital Stay (HOSPITAL_BASED_OUTPATIENT_CLINIC_OR_DEPARTMENT_OTHER): Payer: Medicare HMO | Admitting: Oncology

## 2020-09-06 DIAGNOSIS — Z809 Family history of malignant neoplasm, unspecified: Secondary | ICD-10-CM | POA: Diagnosis not present

## 2020-09-06 DIAGNOSIS — D508 Other iron deficiency anemias: Secondary | ICD-10-CM | POA: Diagnosis not present

## 2020-09-06 DIAGNOSIS — E538 Deficiency of other specified B group vitamins: Secondary | ICD-10-CM

## 2020-09-06 NOTE — Progress Notes (Signed)
Patient contacted for Mychart visit. No new concerns voiced.  

## 2020-09-06 NOTE — Progress Notes (Signed)
HEMATOLOGY-ONCOLOGY TeleHEALTH VISIT PROGRESS NOTE  I connected with Martha Hamilton on 09/06/20  at 11:15 AM EST by video enabled telemedicine visit and verified that I am speaking with the correct person using two identifiers. I discussed the limitations, risks, security and privacy concerns of performing an evaluation and management service by telemedicine and the availability of in-person appointments. The patient expressed understanding and agreed to proceed.   Other persons participating in the visit and their role in the encounter:  None  Patient's location: Home  Provider's location: office Chief Complaint: Iron deficiency and vitamin B12 deficiency   INTERVAL HISTORY Martha Hamilton is a 70 y.o. female who has above history reviewed by me today presents for follow up visit for management of iron deficiency and vitamin B12 deficiency Problems and complaints are listed below:  Patient reports no new complaints.  Had blood work done and presents to discuss results.  Review of Systems  Constitutional: Positive for fatigue. Negative for appetite change, chills and fever.  HENT:   Negative for hearing loss and voice change.   Eyes: Negative for eye problems.  Respiratory: Negative for chest tightness and cough.   Cardiovascular: Negative for chest pain.  Gastrointestinal: Negative for abdominal distention, abdominal pain and blood in stool.  Endocrine: Negative for hot flashes.  Genitourinary: Negative for difficulty urinating and frequency.   Musculoskeletal: Negative for arthralgias.  Skin: Negative for itching and rash.  Neurological: Negative for extremity weakness.  Hematological: Negative for adenopathy.  Psychiatric/Behavioral: Negative for confusion.    Past Medical History:  Diagnosis Date  . Allergic rhinitis   . Breast cancer ([place]) 1996   right breast  . DM2 (diabetes mellitus, type 2) ([place])   . GERD (gastroesophageal reflux disease)   . Hx of cardiovascular  stress test    a. Lex MV 2/14:  EF 69%, no ischemia  . Hyperlipidemia   . Hypertension   . IDA (iron deficiency anemia) 08/27/2020  . Iron deficiency anemia   . Obesity   . Osteoarthritis    hands  . Ulcer    Past Surgical History:  Procedure Laterality Date  . APPENDECTOMY  1976  . BREAST LUMPECTOMY Right 1996   right breast, lumpectomy w/nodes  . CHONDROPLASTY Left 02/10/2018   Procedure: CHONDROPLASTY;  Surgeon: Lovell Sheehan, MD;  Location: [facility];  Service: Orthopedics;  Laterality: Left;  Marland Kitchen GASTRIC RESTRICTION SURGERY     for reflux  . HYSTEROSCOPY WITH D & C N/A 10/18/2012   Procedure: DILATATION AND CURETTAGE /HYSTEROSCOPY;  Surgeon: Mora Bellman, MD;  Location: [facility];  Service: Gynecology;  Laterality: N/A;  . KNEE ARTHROSCOPY WITH MEDIAL MENISECTOMY Left 02/10/2018   Procedure: KNEE ARTHROSCOPY WITH MEDIAL and LATERAL MENISECTOMY;  Surgeon: Lovell Sheehan, MD;  Location: [facility];  Service: Orthopedics;  Laterality: Left;  . POLYPECTOMY N/A 10/18/2012   Procedure: POLYPECTOMY;  Surgeon: Mora Bellman, MD;  Location: [facility] [facility];  Service: Gynecology;  Laterality: N/A;  . SYNOVECTOMY Left 02/10/2018   Procedure: SYNOVECTOMY;  Surgeon: Lovell Sheehan, MD;  Location: [facility];  Service: Orthopedics;  Laterality: Left;    Family History  Problem Relation Age of Onset  . Diabetes Mother   . Heart disease Mother        Pacemaker, CHF  . Hypertension Mother   . Kidney cancer Mother   . Stroke Father   . CAD Father 20       Died with MI  . Esophageal cancer Brother   .  Heart disease Sister   . Colon cancer Neg Hx   . Rectal cancer Neg Hx   . Stomach cancer Neg Hx   . Pancreatic cancer Neg Hx     Social History   Socioeconomic History  . Marital status: Single    Spouse name: Not on file  . Number of children: 0  . Years of education: Not on file  . Highest education level: Not on file  Occupational History  . Occupation: retired    Fish farm manager: RETIRED  Tobacco  Use  . Smoking status: Never Smoker  . Smokeless tobacco: Never Used  Vaping Use  . Vaping Use: Never used  Substance and Sexual Activity  . Alcohol use: Never    Alcohol/week: 0.0 standard drinks  . Drug use: No  . Sexual activity: Not Currently    Birth control/protection: Post-menopausal  Other Topics Concern  . Not on file  Social History Narrative   Lives with, and cares for her elderly mother.   Social Determinants of Health   Financial Resource Strain: Not on file  Food Insecurity: Not on file  Transportation Needs: Not on file  Physical Activity: Not on file  Stress: Not on file  Social Connections: Not on file  Intimate Partner Violence: Not on file    Current Outpatient Medications on File Prior to Visit  Medication Sig Dispense Refill  . Accu-Chek Softclix Lancets lancets TEST BLOOD SUGAR EVERY DAY FOR DIABETES 100 each 3  . acetaminophen (TYLENOL) 500 MG tablet Take 1,000 mg by mouth every 6 (six) hours as needed for mild pain.     . Alcohol Swabs (B-D SINGLE USE SWABS REGULAR) PADS USE ONE TIME DAILY AS DIRECTED WHEN CHECKING BLOOD SUGAR 100 each 3  . amitriptyline (ELAVIL) 25 MG tablet TAKE 1 TABLET AT BEDTIME 90 tablet 3  . aspirin 81 MG tablet Take 81 mg by mouth at bedtime.     . Blood Glucose Calibration (ACCU-CHEK AVIVA) SOLN Use to calibrate meter 3 each 3  . Blood Glucose Monitoring Suppl (ACCU-CHEK AVIVA PLUS) w/Device KIT Use to check blood sugar once daily for DM (dx. E11.9) 1 kit 0  . calcium elemental as carbonate (BARIATRIC TUMS ULTRA) 400 MG chewable tablet Chew 1,000 mg by mouth daily as needed for heartburn.    . Cholecalciferol (VITAMIN D) 2000 units tablet Take 2,000 Units by mouth daily.    . fenofibrate (TRICOR) 145 MG tablet Take 1 tablet (145 mg total) by mouth daily. 90 tablet 3  . glipiZIDE (GLUCOTROL XL) 2.5 MG 24 hr tablet TAKE 1 TABLET EVERY EVENING 90 tablet 3  . glucose blood (ACCU-CHEK AVIVA PLUS) test strip TEST BLOOD SUGAR EVERY DAY  FOR DIABETES (Dx. E11.9) 100 strip 1  . loratadine (CLARITIN) 10 MG tablet Take 10 mg by mouth daily as needed for allergies.    Marland Kitchen losartan (COZAAR) 50 MG tablet TAKE 1 TABLET EVERY DAY 90 tablet 3  . meclizine (ANTIVERT) 25 MG tablet Take 1 tablet (25 mg total) by mouth 3 (three) times daily as needed for dizziness. 90 tablet 3  . metFORMIN (GLUCOPHAGE) 1000 MG tablet Take 0.5 tablets (500 mg total) by mouth 2 (two) times daily with a meal. TAKE 1/2 (542m) TABLET TWICE DAILY WITH A MEAL 1 tablet 0  . omeprazole (PRILOSEC) 40 MG capsule TAKE 1 CAPSULE IN THE MORNING AND AT BEDTIME. 180 capsule 1  . ondansetron (ZOFRAN) 4 MG tablet Take 1 tablet (4 mg total) by mouth every 4 (  four) hours as needed for nausea or vomiting. 90 tablet 2  . SALINE NASAL SPRAY NA Place 1 spray into the nose daily as needed (congestion).      No current facility-administered medications on file prior to visit.    Allergies  Allergen Reactions  . Cholestyramine Other (See Comments)    REACTION: Muscles tightened up, drew up.  . Lipitor [Atorvastatin] Other (See Comments)    Muscle cramping  . Niacin Nausea And Vomiting  . Simvastatin     Muscular pain       Observations/Objective: Today's Vitals   09/06/20 1123  PainSc: 0-No pain   There is no height or weight on file to calculate BMI.  Physical Exam Neurological:     Mental Status: She is alert.     CBC    Component Value Date/Time   WBC 9.3 08/27/2020 1611   RBC 3.19 (L) 08/27/2020 1611   RBC 3.24 (L) 08/27/2020 1611   HGB 8.9 (L) 08/27/2020 1611   HGB 12.6 10/07/2006 0747   HCT 27.8 (L) 08/27/2020 1611   HCT 35.5 10/07/2006 0747   PLT 666 (H) 08/27/2020 1611   PLT 353 10/07/2006 0747   MCV 85.8 08/27/2020 1611   MCV 86.2 10/07/2006 0747   MCH 27.5 08/27/2020 1611   MCHC 32.0 08/27/2020 1611   RDW 15.6 (H) 08/27/2020 1611   RDW 12.7 10/07/2006 0747   LYMPHSABS 2.3 08/27/2020 1611   LYMPHSABS 2.3 10/07/2006 0747   MONOABS 0.6  08/27/2020 1611   MONOABS 0.5 10/07/2006 0747   EOSABS 0.3 08/27/2020 1611   EOSABS 0.2 10/07/2006 0747   BASOSABS 0.1 08/27/2020 1611   BASOSABS 0.0 10/07/2006 0747    CMP     Component Value Date/Time   NA 136 08/27/2020 1611   K 4.8 08/27/2020 1611   CL 103 08/27/2020 1611   CO2 21 (L) 08/27/2020 1611   GLUCOSE 108 (H) 08/27/2020 1611   BUN 26 (H) 08/27/2020 1611   CREATININE 1.36 (H) 08/27/2020 1611   CALCIUM 9.5 08/27/2020 1611   PROT 7.9 08/27/2020 1611   ALBUMIN 3.8 08/27/2020 1611   AST 27 08/27/2020 1611   ALT 20 08/27/2020 1611   ALKPHOS 35 (L) 08/27/2020 1611   BILITOT 0.4 08/27/2020 1611   GFRNONAA 42 (L) 08/27/2020 1611   GFRAA >60 02/02/2018 1056     Assessment and Plan: 1. Other iron deficiency anemia   2. B12 deficiency   3. Family history of cancer     #Vitamin B12 deficiency B12 level is less than 50. Recommend patient to start parenteral vitamin B12 injection.  I will start with daily for 5 days followed by weekly for 4 weeks followed by monthly. Patient agrees with treatment.  #Iron deficiency anemia, Plan IV iron with Venofer 241m weekly x 4 doses. Allergy reactions/infusion reaction including anaphylactic reaction discussed with patient. Other side effects include but not limited to high blood pressure, skin rash, weight gain, leg swelling, etc. Patient voices understanding and willing to proceed.  #Family history of cancer.  Previously discussed about recommendation of genetic testing. Follow Up Instructions: 6 months.   I discussed the assessment and treatment plan with the patient. The patient was provided an opportunity to ask questions and all were answered. The patient agreed with the plan and demonstrated an understanding of the instructions.  The patient was advised to call back or seek an in-person evaluation if the symptoms worsen or if the condition fails to improve as anticipated.  Earlie Server, MD 09/06/2020 7:45 PM

## 2020-09-07 ENCOUNTER — Inpatient Hospital Stay: Payer: Medicare HMO

## 2020-09-07 ENCOUNTER — Other Ambulatory Visit: Payer: Self-pay

## 2020-09-07 VITALS — BP 154/79 | HR 105 | Temp 97.8°F | Resp 18

## 2020-09-07 DIAGNOSIS — Z8051 Family history of malignant neoplasm of kidney: Secondary | ICD-10-CM | POA: Diagnosis not present

## 2020-09-07 DIAGNOSIS — Z853 Personal history of malignant neoplasm of breast: Secondary | ICD-10-CM | POA: Diagnosis not present

## 2020-09-07 DIAGNOSIS — E119 Type 2 diabetes mellitus without complications: Secondary | ICD-10-CM | POA: Diagnosis not present

## 2020-09-07 DIAGNOSIS — I10 Essential (primary) hypertension: Secondary | ICD-10-CM | POA: Diagnosis not present

## 2020-09-07 DIAGNOSIS — E538 Deficiency of other specified B group vitamins: Secondary | ICD-10-CM | POA: Diagnosis not present

## 2020-09-07 DIAGNOSIS — D508 Other iron deficiency anemias: Secondary | ICD-10-CM

## 2020-09-07 DIAGNOSIS — D509 Iron deficiency anemia, unspecified: Secondary | ICD-10-CM | POA: Diagnosis not present

## 2020-09-07 DIAGNOSIS — E785 Hyperlipidemia, unspecified: Secondary | ICD-10-CM | POA: Diagnosis not present

## 2020-09-07 DIAGNOSIS — Z79899 Other long term (current) drug therapy: Secondary | ICD-10-CM | POA: Diagnosis not present

## 2020-09-07 DIAGNOSIS — D75839 Thrombocytosis, unspecified: Secondary | ICD-10-CM | POA: Diagnosis not present

## 2020-09-07 MED ORDER — CYANOCOBALAMIN 1000 MCG/ML IJ SOLN
1000.0000 ug | Freq: Once | INTRAMUSCULAR | Status: AC
  Administered 2020-09-07: 1000 ug via INTRAMUSCULAR
  Filled 2020-09-07: qty 1

## 2020-09-07 MED ORDER — IRON SUCROSE 20 MG/ML IV SOLN
200.0000 mg | Freq: Once | INTRAVENOUS | Status: AC
  Administered 2020-09-07: 200 mg via INTRAVENOUS
  Filled 2020-09-07: qty 10

## 2020-09-07 MED ORDER — SODIUM CHLORIDE 0.9 % IV SOLN
Freq: Once | INTRAVENOUS | Status: AC
  Filled 2020-09-07: qty 250

## 2020-09-07 MED ORDER — SODIUM CHLORIDE 0.9 % IV SOLN: 200.0000 mg | Freq: Once | INTRAVENOUS | Status: DC

## 2020-09-07 NOTE — Addendum Note (Signed)
Addended by: Earlie Server on: 09/07/2020 02:24 PM   Modules accepted: Orders

## 2020-09-11 ENCOUNTER — Telehealth: Payer: Medicare HMO | Admitting: Oncology

## 2020-09-11 ENCOUNTER — Inpatient Hospital Stay: Payer: Medicare HMO | Attending: Oncology

## 2020-09-11 VITALS — BP 144/70 | HR 101 | Temp 97.7°F | Resp 18

## 2020-09-11 DIAGNOSIS — E538 Deficiency of other specified B group vitamins: Secondary | ICD-10-CM | POA: Diagnosis not present

## 2020-09-11 DIAGNOSIS — D508 Other iron deficiency anemias: Secondary | ICD-10-CM | POA: Diagnosis not present

## 2020-09-11 MED ORDER — IRON SUCROSE 20 MG/ML IV SOLN
200.0000 mg | Freq: Once | INTRAVENOUS | Status: AC
  Administered 2020-09-11: 200 mg via INTRAVENOUS
  Filled 2020-09-11: qty 10

## 2020-09-11 MED ORDER — SODIUM CHLORIDE 0.9 % IV SOLN
Freq: Once | INTRAVENOUS | Status: AC
  Filled 2020-09-11: qty 250

## 2020-09-11 MED ORDER — SODIUM CHLORIDE 0.9 % IV SOLN: 200.0000 mg | Freq: Once | INTRAVENOUS | Status: DC

## 2020-09-11 MED ORDER — CYANOCOBALAMIN 1000 MCG/ML IJ SOLN
1000.0000 ug | Freq: Once | INTRAMUSCULAR | Status: AC
  Administered 2020-09-11: 1000 ug via INTRAMUSCULAR
  Filled 2020-09-11: qty 1

## 2020-09-13 ENCOUNTER — Ambulatory Visit: Payer: Medicare HMO

## 2020-09-13 DIAGNOSIS — H2513 Age-related nuclear cataract, bilateral: Secondary | ICD-10-CM | POA: Diagnosis not present

## 2020-09-13 DIAGNOSIS — H43813 Vitreous degeneration, bilateral: Secondary | ICD-10-CM | POA: Diagnosis not present

## 2020-09-21 ENCOUNTER — Inpatient Hospital Stay: Payer: Medicare HMO

## 2020-09-21 VITALS — BP 153/79 | HR 99 | Resp 18

## 2020-09-21 DIAGNOSIS — D508 Other iron deficiency anemias: Secondary | ICD-10-CM

## 2020-09-21 DIAGNOSIS — E538 Deficiency of other specified B group vitamins: Secondary | ICD-10-CM | POA: Diagnosis not present

## 2020-09-21 MED ORDER — SODIUM CHLORIDE 0.9 % IV SOLN: 200.0000 mg | Freq: Once | INTRAVENOUS | Status: DC

## 2020-09-21 MED ORDER — SODIUM CHLORIDE 0.9 % IV SOLN
Freq: Once | INTRAVENOUS | Status: AC
  Filled 2020-09-21: qty 250

## 2020-09-21 MED ORDER — CYANOCOBALAMIN 1000 MCG/ML IJ SOLN
1000.0000 ug | Freq: Once | INTRAMUSCULAR | Status: AC
  Administered 2020-09-21: 1000 ug via INTRAMUSCULAR
  Filled 2020-09-21: qty 1

## 2020-09-21 MED ORDER — IRON SUCROSE 20 MG/ML IV SOLN
200.0000 mg | Freq: Once | INTRAVENOUS | Status: AC
  Administered 2020-09-21: 200 mg via INTRAVENOUS
  Filled 2020-09-21: qty 10

## 2020-09-21 NOTE — Progress Notes (Signed)
Pt received prescribed treatment in clinic, pt stable at d/c. 

## 2020-09-28 ENCOUNTER — Other Ambulatory Visit: Payer: Self-pay

## 2020-09-28 ENCOUNTER — Inpatient Hospital Stay: Payer: Medicare HMO

## 2020-09-28 VITALS — BP 144/83 | HR 99 | Temp 99.9°F | Resp 18

## 2020-09-28 DIAGNOSIS — D508 Other iron deficiency anemias: Secondary | ICD-10-CM | POA: Diagnosis not present

## 2020-09-28 DIAGNOSIS — E538 Deficiency of other specified B group vitamins: Secondary | ICD-10-CM | POA: Diagnosis not present

## 2020-09-28 MED ORDER — SODIUM CHLORIDE 0.9 % IV SOLN
Freq: Once | INTRAVENOUS | Status: AC
  Filled 2020-09-28: qty 250

## 2020-09-28 MED ORDER — SODIUM CHLORIDE 0.9 % IV SOLN: 200.0000 mg | Freq: Once | INTRAVENOUS | Status: DC

## 2020-09-28 MED ORDER — CYANOCOBALAMIN 1000 MCG/ML IJ SOLN
1000.0000 ug | Freq: Once | INTRAMUSCULAR | Status: AC
  Administered 2020-09-28: 1000 ug via INTRAMUSCULAR
  Filled 2020-09-28: qty 1

## 2020-09-28 MED ORDER — IRON SUCROSE 20 MG/ML IV SOLN
200.0000 mg | Freq: Once | INTRAVENOUS | Status: AC
  Administered 2020-09-28: 200 mg via INTRAVENOUS
  Filled 2020-09-28: qty 10

## 2020-09-28 NOTE — Progress Notes (Signed)
Pt tolerated venofer infusion well with no complications.  B12 injection administered to left deltoid, site benign.  Pt left infusion suite stable and ambulatory.

## 2020-10-10 ENCOUNTER — Other Ambulatory Visit: Payer: Self-pay

## 2020-10-10 ENCOUNTER — Telehealth (INDEPENDENT_AMBULATORY_CARE_PROVIDER_SITE_OTHER): Payer: Medicare HMO | Admitting: Family Medicine

## 2020-10-10 ENCOUNTER — Encounter: Payer: Self-pay | Admitting: Family Medicine

## 2020-10-10 DIAGNOSIS — J0100 Acute maxillary sinusitis, unspecified: Secondary | ICD-10-CM | POA: Diagnosis not present

## 2020-10-10 DIAGNOSIS — J019 Acute sinusitis, unspecified: Secondary | ICD-10-CM | POA: Insufficient documentation

## 2020-10-10 MED ORDER — AMOXICILLIN-POT CLAVULANATE 875-125 MG PO TABS: 1.0000 | ORAL_TABLET | Freq: Two times a day (BID) | ORAL | 0 refills | Status: DC

## 2020-10-10 NOTE — Progress Notes (Signed)
Virtual Visit via Video Note  I connected with Martha Hamilton on 10/10/20 at  8:30 AM EDT by a video enabled telemedicine application and verified that I am speaking with the correct person using two identifiers.  Location: Patient: home Provider: office   I discussed the limitations of evaluation and management by telemedicine and the availability of in person appointments. The patient expressed understanding and agreed to proceed.  Parties involved in encounter  Patient: Martha Hamilton   Provider:  Loura Pardon MD    History of Present Illness: Pt presents for uri symptoms  Started wed am (a week ago)    Hoarse voice Throat is not sore  Pressure in sinuses  Post nasal drip/nose is congested  Mucous is hard to get out so unsure if colored Some chest congestion/ mild cough  Sore in face/tender   Had a temp on Saturday night , woke up in a sweat  No body aches   No n/v/d   otc Took cough syrup  Tylenol  Multi symptom cold med   No covid test but has fully isolated and it has been a week  Would rather not test  Has been in pollen No sick contacts    Fully immunized for covid and flu and pna   Patient Active Problem List   Diagnosis Date Noted  . Acute sinusitis 10/10/2020  . Family history of cancer 08/27/2020  . History of breast cancer 08/27/2020  . IDA (iron deficiency anemia) 08/27/2020  . B12 deficiency 08/27/2020  . Nausea 07/24/2020  . Decreased GFR 07/24/2020  . Tachycardia 01/11/2019  . Fatty liver 06/21/2018  . Obesity (BMI 30-39.9) 06/21/2018  . Dyspepsia 05/25/2018  . GERD (gastroesophageal reflux disease) 05/25/2018  . H/O vertigo 05/11/2018  . Pre-op examination 01/22/2018  . Thrombocytosis 12/25/2017  . Tear of medial meniscus of knee 12/21/2017  . Left knee pain 09/21/2017  . Low back pain 07/06/2017  . Welcome to Medicare preventive visit 12/17/2016  . Grief reaction 12/17/2016  . Estrogen deficiency 09/08/2016  . Muscle pain 02/08/2016   . Normocytic anemia 02/09/2013  . Abnormal EKG 07/27/2012  . Special screening for malignant neoplasms, colon 04/29/2011  . Gynecological examination 04/29/2011  . Routine general medical examination at a health care facility 04/20/2011  . OSTEOARTHRITIS, HANDS, BILATERAL 10/31/2008  . Diabetes type 2, controlled ([place]) 07/10/2008  . Hyperlipidemia associated with type 2 diabetes mellitus ([place]) 01/26/2008  . Essential hypertension 01/26/2008  . ALLERGIC RHINITIS 12/07/2007   Past Medical History:  Diagnosis Date  . Allergic rhinitis   . Breast cancer ([place]) 1996   right breast  . DM2 (diabetes mellitus, type 2) ([place])   . GERD (gastroesophageal reflux disease)   . Hx of cardiovascular stress test    a. Lex MV 2/14:  EF 69%, no ischemia  . Hyperlipidemia   . Hypertension   . IDA (iron deficiency anemia) 08/27/2020  . Iron deficiency anemia   . Obesity   . Osteoarthritis    hands  . Ulcer    Past Surgical History:  Procedure Laterality Date  . APPENDECTOMY  1976  . BREAST LUMPECTOMY Right 1996   right breast, lumpectomy w/nodes  . CHONDROPLASTY Left 02/10/2018   Procedure: CHONDROPLASTY;  Surgeon: Lovell Sheehan, MD;  Location: [facility];  Service: Orthopedics;  Laterality: Left;  Marland Kitchen GASTRIC RESTRICTION SURGERY     for reflux  . HYSTEROSCOPY WITH D & C N/A 10/18/2012   Procedure: DILATATION AND CURETTAGE /HYSTEROSCOPY;  Surgeon:  Mora Bellman, MD;  Location: [place] [facility];  Service: Gynecology;  Laterality: N/A;  . KNEE ARTHROSCOPY WITH MEDIAL MENISECTOMY Left 02/10/2018   Procedure: KNEE ARTHROSCOPY WITH MEDIAL and LATERAL MENISECTOMY;  Surgeon: Lovell Sheehan, MD;  Location: [facility];  Service: Orthopedics;  Laterality: Left;  . POLYPECTOMY N/A 10/18/2012   Procedure: POLYPECTOMY;  Surgeon: Mora Bellman, MD;  Location: [facility];  Service: Gynecology;  Laterality: N/A;  . SYNOVECTOMY Left 02/10/2018   Procedure: SYNOVECTOMY;  Surgeon: Lovell Sheehan, MD;  Location: [facility];  Service:  Orthopedics;  Laterality: Left;   Social History   Tobacco Use  . Smoking status: Never Smoker  . Smokeless tobacco: Never Used  Vaping Use  . Vaping Use: Never used  Substance Use Topics  . Alcohol use: Never    Alcohol/week: 0.0 standard drinks  . Drug use: No   Family History  Problem Relation Age of Onset  . Diabetes Mother   . Heart disease Mother        Pacemaker, CHF  . Hypertension Mother   . Kidney cancer Mother   . Stroke Father   . CAD Father 83       Died with MI  . Esophageal cancer Brother   . Heart disease Sister   . Colon cancer Neg Hx   . Rectal cancer Neg Hx   . Stomach cancer Neg Hx   . Pancreatic cancer Neg Hx    Allergies  Allergen Reactions  . Cholestyramine Other (See Comments)    REACTION: Muscles tightened up, drew up.  . Lipitor [Atorvastatin] Other (See Comments)    Muscle cramping  . Niacin Nausea And Vomiting  . Simvastatin     Muscular pain   Current Outpatient Medications on File Prior to Visit  Medication Sig Dispense Refill  . Accu-Chek Softclix Lancets lancets TEST BLOOD SUGAR EVERY DAY FOR DIABETES 100 each 3  . acetaminophen (TYLENOL) 500 MG tablet Take 1,000 mg by mouth every 6 (six) hours as needed for mild pain.     . Alcohol Swabs (B-D SINGLE USE SWABS REGULAR) PADS USE ONE TIME DAILY AS DIRECTED WHEN CHECKING BLOOD SUGAR 100 each 3  . amitriptyline (ELAVIL) 25 MG tablet TAKE 1 TABLET AT BEDTIME 90 tablet 3  . aspirin 81 MG tablet Take 81 mg by mouth at bedtime.     . Blood Glucose Calibration (ACCU-CHEK AVIVA) SOLN Use to calibrate meter 3 each 3  . Blood Glucose Monitoring Suppl (ACCU-CHEK AVIVA PLUS) w/Device KIT Use to check blood sugar once daily for DM (dx. E11.9) 1 kit 0  . calcium elemental as carbonate (BARIATRIC TUMS ULTRA) 400 MG chewable tablet Chew 1,000 mg by mouth daily as needed for heartburn.    . Cholecalciferol (VITAMIN D) 2000 units tablet Take 2,000 Units by mouth daily.    . fenofibrate (TRICOR) 145 MG  tablet Take 1 tablet (145 mg total) by mouth daily. 90 tablet 3  . glipiZIDE (GLUCOTROL XL) 2.5 MG 24 hr tablet TAKE 1 TABLET EVERY EVENING 90 tablet 3  . glucose blood (ACCU-CHEK AVIVA PLUS) test strip TEST BLOOD SUGAR EVERY DAY FOR DIABETES (Dx. E11.9) 100 strip 1  . loratadine (CLARITIN) 10 MG tablet Take 10 mg by mouth daily as needed for allergies.    Marland Kitchen losartan (COZAAR) 50 MG tablet TAKE 1 TABLET EVERY DAY 90 tablet 3  . meclizine (ANTIVERT) 25 MG tablet Take 1 tablet (25 mg total) by mouth 3 (three) times daily as needed for  dizziness. 90 tablet 3  . metFORMIN (GLUCOPHAGE) 1000 MG tablet Take 0.5 tablets (500 mg total) by mouth 2 (two) times daily with a meal. TAKE 1/2 (548m) TABLET TWICE DAILY WITH A MEAL 1 tablet 0  . omeprazole (PRILOSEC) 40 MG capsule TAKE 1 CAPSULE IN THE MORNING AND AT BEDTIME. 180 capsule 1  . ondansetron (ZOFRAN) 4 MG tablet Take 1 tablet (4 mg total) by mouth every 4 (four) hours as needed for nausea or vomiting. 90 tablet 2  . SALINE NASAL SPRAY NA Place 1 spray into the nose daily as needed (congestion).      No current facility-administered medications on file prior to visit.   Review of Systems  Constitutional: Negative for chills, fever and malaise/fatigue.  HENT: Positive for congestion and sinus pain. Negative for ear pain and sore throat.        Hoarse  Eyes: Negative for blurred vision, discharge and redness.  Respiratory: Positive for cough. Negative for sputum production, shortness of breath and stridor.   Cardiovascular: Negative for chest pain, palpitations and leg swelling.  Gastrointestinal: Negative for abdominal pain, diarrhea, nausea and vomiting.  Musculoskeletal: Negative for myalgias.  Skin: Negative for rash.  Neurological: Positive for headaches. Negative for dizziness.    Observations/Objective: Patient appears well, in no distress Weight is baseline  No facial swelling or asymmetry Very hoarse voice  Tenderness to self palpate  maxillary sinuses No obvious tremor or mobility impairment Moving neck and UEs normally Able to hear the call well  No cough or shortness of breath during interview  Talkative and mentally sharp with no cognitive changes No skin changes on face or neck , no rash or pallor Affect is normal    Assessment and Plan: Problem List Items Addressed This Visit      Respiratory   Acute sinusitis    A week into viral uri with facial pain and pressure  Px augmentin  Nasal saline, can try nasal steroid also if helpful  Disc symptom tx and ER precautions Will watch for wheeze or tight chest or severe headache  Rest voice for hoarseness  Update if not starting to improve in a week or if worsening   Pt declines covid testing-is 7 d into this and will isolate until better       Relevant Medications   amoxicillin-clavulanate (AUGMENTIN) 875-125 MG tablet       Follow Up Instructions: Drink fluids and rest and isolate until you feel better  Take augmentin for sinus infection  Drink fluids and rest  mucinex DM is good for cough and congestion  Nasal saline for congestion as needed  Tylenol for fever or pain or headache  Please alert [place] symptoms worsen (if severe or short of breath please go to the ER)     I discussed the assessment and treatment plan with the patient. The patient was provided an opportunity to ask questions and all were answered. The patient agreed with the plan and demonstrated an understanding of the instructions.   The patient was advised to call back or seek an in-person evaluation if the symptoms worsen or if the condition fails to improve as anticipated.     MLoura Pardon MD

## 2020-10-10 NOTE — Patient Instructions (Signed)
Drink fluids and rest and isolate until you feel better  Take augmentin for sinus infection  Drink fluids and rest  mucinex DM is good for cough and congestion  Nasal saline for congestion as needed  Tylenol for fever or pain or headache  Please alert [place] if symptoms worsen (if severe or short of breath please go to the ER)

## 2020-10-10 NOTE — Assessment & Plan Note (Signed)
A week into viral uri with facial pain and pressure  Px augmentin  Nasal saline, can try nasal steroid also if helpful  Disc symptom tx and ER precautions Will watch for wheeze or tight chest or severe headache  Rest voice for hoarseness  Update if not starting to improve in a week or if worsening   Pt declines covid testing-is 7 d into this and will isolate until better

## 2020-10-31 ENCOUNTER — Other Ambulatory Visit: Payer: Self-pay

## 2020-10-31 ENCOUNTER — Inpatient Hospital Stay: Payer: Medicare HMO | Attending: Oncology

## 2020-10-31 ENCOUNTER — Other Ambulatory Visit: Payer: Self-pay | Admitting: Family Medicine

## 2020-10-31 ENCOUNTER — Other Ambulatory Visit: Payer: Self-pay | Admitting: Internal Medicine

## 2020-10-31 DIAGNOSIS — H2512 Age-related nuclear cataract, left eye: Secondary | ICD-10-CM | POA: Diagnosis not present

## 2020-10-31 DIAGNOSIS — R5383 Other fatigue: Secondary | ICD-10-CM | POA: Diagnosis not present

## 2020-10-31 DIAGNOSIS — D508 Other iron deficiency anemias: Secondary | ICD-10-CM

## 2020-10-31 DIAGNOSIS — Z79899 Other long term (current) drug therapy: Secondary | ICD-10-CM | POA: Diagnosis not present

## 2020-10-31 DIAGNOSIS — E119 Type 2 diabetes mellitus without complications: Secondary | ICD-10-CM | POA: Insufficient documentation

## 2020-10-31 DIAGNOSIS — E538 Deficiency of other specified B group vitamins: Secondary | ICD-10-CM | POA: Insufficient documentation

## 2020-10-31 DIAGNOSIS — M1990 Unspecified osteoarthritis, unspecified site: Secondary | ICD-10-CM | POA: Diagnosis not present

## 2020-10-31 DIAGNOSIS — E785 Hyperlipidemia, unspecified: Secondary | ICD-10-CM | POA: Diagnosis not present

## 2020-10-31 DIAGNOSIS — I10 Essential (primary) hypertension: Secondary | ICD-10-CM | POA: Diagnosis not present

## 2020-10-31 DIAGNOSIS — Z7984 Long term (current) use of oral hypoglycemic drugs: Secondary | ICD-10-CM | POA: Insufficient documentation

## 2020-10-31 DIAGNOSIS — Z7982 Long term (current) use of aspirin: Secondary | ICD-10-CM | POA: Insufficient documentation

## 2020-10-31 LAB — CBC WITH DIFFERENTIAL/PLATELET
Abs Immature Granulocytes: 0.03 K/uL (ref 0.00–0.07)
Basophils Absolute: 0.1 K/uL (ref 0.0–0.1)
Basophils Relative: 1 %
Eosinophils Absolute: 0.4 K/uL (ref 0.0–0.5)
Eosinophils Relative: 5 %
HCT: 34.3 % — ABNORMAL LOW (ref 36.0–46.0)
Hemoglobin: 10.8 g/dL — ABNORMAL LOW (ref 12.0–15.0)
Immature Granulocytes: 0 %
Lymphocytes Relative: 24 %
Lymphs Abs: 1.8 K/uL (ref 0.7–4.0)
MCH: 27.1 pg (ref 26.0–34.0)
MCHC: 31.5 g/dL (ref 30.0–36.0)
MCV: 86.2 fL (ref 80.0–100.0)
Monocytes Absolute: 0.5 K/uL (ref 0.1–1.0)
Monocytes Relative: 7 %
Neutro Abs: 4.8 K/uL (ref 1.7–7.7)
Neutrophils Relative %: 63 %
Platelets: 406 K/uL — ABNORMAL HIGH (ref 150–400)
RBC: 3.98 MIL/uL (ref 3.87–5.11)
RDW: 18.6 % — ABNORMAL HIGH (ref 11.5–15.5)
WBC: 7.5 K/uL (ref 4.0–10.5)
nRBC: 0 % (ref 0.0–0.2)

## 2020-10-31 LAB — VITAMIN B12: Vitamin B-12: 109 pg/mL — ABNORMAL LOW (ref 180–914)

## 2020-10-31 LAB — IRON AND TIBC
Iron: 39 ug/dL (ref 28–170)
Saturation Ratios: 9 % — ABNORMAL LOW (ref 10.4–31.8)
TIBC: 444 ug/dL (ref 250–450)
UIBC: 405 ug/dL

## 2020-10-31 LAB — FERRITIN: Ferritin: 35 ng/mL (ref 11–307)

## 2020-11-02 ENCOUNTER — Other Ambulatory Visit: Payer: Self-pay

## 2020-11-02 ENCOUNTER — Inpatient Hospital Stay: Payer: Medicare HMO | Admitting: Oncology

## 2020-11-02 ENCOUNTER — Inpatient Hospital Stay (HOSPITAL_BASED_OUTPATIENT_CLINIC_OR_DEPARTMENT_OTHER): Payer: Medicare HMO

## 2020-11-02 ENCOUNTER — Encounter: Payer: Self-pay | Admitting: Oncology

## 2020-11-02 VITALS — BP 174/93 | HR 93 | Temp 96.1°F | Resp 18

## 2020-11-02 VITALS — BP 164/88 | HR 109 | Temp 96.3°F | Resp 20 | Wt 164.0 lb

## 2020-11-02 DIAGNOSIS — E785 Hyperlipidemia, unspecified: Secondary | ICD-10-CM | POA: Diagnosis not present

## 2020-11-02 DIAGNOSIS — D508 Other iron deficiency anemias: Secondary | ICD-10-CM | POA: Diagnosis not present

## 2020-11-02 DIAGNOSIS — I10 Essential (primary) hypertension: Secondary | ICD-10-CM | POA: Diagnosis not present

## 2020-11-02 DIAGNOSIS — E538 Deficiency of other specified B group vitamins: Secondary | ICD-10-CM | POA: Diagnosis not present

## 2020-11-02 DIAGNOSIS — Z7984 Long term (current) use of oral hypoglycemic drugs: Secondary | ICD-10-CM | POA: Diagnosis not present

## 2020-11-02 DIAGNOSIS — Z809 Family history of malignant neoplasm, unspecified: Secondary | ICD-10-CM

## 2020-11-02 DIAGNOSIS — M1990 Unspecified osteoarthritis, unspecified site: Secondary | ICD-10-CM | POA: Diagnosis not present

## 2020-11-02 DIAGNOSIS — E119 Type 2 diabetes mellitus without complications: Secondary | ICD-10-CM | POA: Diagnosis not present

## 2020-11-02 DIAGNOSIS — Z7982 Long term (current) use of aspirin: Secondary | ICD-10-CM | POA: Diagnosis not present

## 2020-11-02 DIAGNOSIS — R5383 Other fatigue: Secondary | ICD-10-CM | POA: Diagnosis not present

## 2020-11-02 MED ORDER — SODIUM CHLORIDE 0.9 % IV SOLN: 200.0000 mg | Freq: Once | INTRAVENOUS | Status: DC

## 2020-11-02 MED ORDER — SODIUM CHLORIDE 0.9 % IV SOLN
Freq: Once | INTRAVENOUS | Status: AC
  Filled 2020-11-02: qty 250

## 2020-11-02 MED ORDER — CYANOCOBALAMIN 1000 MCG/ML IJ SOLN
1000.0000 ug | Freq: Once | INTRAMUSCULAR | Status: AC
  Administered 2020-11-02: 1000 ug via INTRAMUSCULAR
  Filled 2020-11-02: qty 1

## 2020-11-02 MED ORDER — IRON SUCROSE 20 MG/ML IV SOLN
200.0000 mg | Freq: Once | INTRAVENOUS | Status: AC
  Administered 2020-11-02: 200 mg via INTRAVENOUS
  Filled 2020-11-02: qty 10

## 2020-11-02 MED ORDER — IRON SUCROSE 20 MG/ML IV SOLN: 200.0000 mg | Freq: Once | INTRAVENOUS | Status: DC

## 2020-11-02 NOTE — Progress Notes (Signed)
Patient states no new concerns. 

## 2020-11-02 NOTE — Progress Notes (Signed)
Pt received IV venofer and b12 injection in clinic today. Tolerated well. VSS @ d/c.

## 2020-11-03 NOTE — Progress Notes (Signed)
Hematology/Oncology follow up note [facility] [phone] [phone]   Patient Care Team: Tower, Wynelle Fanny, MD as PCP - General  REFERRING PROVIDER: Abner Greenspan, MD  CHIEF COMPLAINTS/REASON FOR VISIT:  Follow-up for iron deficiency anemia  HISTORY OF PRESENTING ILLNESS:  Martha Hamilton is a  70 y.o.  female with PMH listed below who was referred to me for evaluation of anemia Reviewed patient's recent labs that was done.  08/08/20 Labs revealed anemia with hemoglobin of 9.2, MCV 93 .   Reviewed patient's previous labs ordered by primary care physician's office, anemia is chronic onset , duration is since 2013, with baseline in 11s, worsened during the recent 6 months, after she stops taking oral iron supplementation.   Associated signs and symptoms: Patient reports fatigue. denies SOB with exertion.  Denies weight loss, easy bruising, hematochezia, hemoptysis, hematuria. Context: History of GI bleeding: deneis               History of gastric restriction surgery for GERD                Last colonoscopy: 2014. Last Endoscopy 2020.  Remote history of right breast cancer, s/p lumpectomy with node. She denies any previous chemotherapy or any anti estrogen therapy.     Family history positive for brother with esophageal cancer, mother with RCC.  I have recommended genetic testing.  She wanted to defer and if she changes her mind she will update me..   INTERVAL HISTORY Martha Hamilton is a 70 y.o. female who has above history reviewed by me today presents for follow up visit for iron deficiency anemia Problems and complaints are listed below: Patient reports fatigue has improved after previous IV Venofer treatments.  No new complaints.  Review of Systems  Constitutional: Positive for fatigue. Negative for appetite change, chills and fever.  HENT:   Negative for hearing loss and voice change.   Eyes: Negative for eye problems.   Respiratory: Negative for chest tightness and cough.   Cardiovascular: Negative for chest pain.  Gastrointestinal: Negative for abdominal distention, abdominal pain and blood in stool.  Endocrine: Negative for hot flashes.  Genitourinary: Negative for difficulty urinating and frequency.   Musculoskeletal: Negative for arthralgias.  Skin: Negative for itching and rash.  Neurological: Negative for extremity weakness.  Hematological: Negative for adenopathy.  Psychiatric/Behavioral: Negative for confusion.     MEDICAL HISTORY:  Past Medical History:  Diagnosis Date  . Allergic rhinitis   . Breast cancer ([place]) 1996   right breast  . DM2 (diabetes mellitus, type 2) ([place])   . GERD (gastroesophageal reflux disease)   . Hx of cardiovascular stress test    a. Lex MV 2/14:  EF 69%, no ischemia  . Hyperlipidemia   . Hypertension   . IDA (iron deficiency anemia) 08/27/2020  . Iron deficiency anemia   . Obesity   . Osteoarthritis    hands  . Ulcer     SURGICAL HISTORY: Past Surgical History:  Procedure Laterality Date  . APPENDECTOMY  1976  . BREAST LUMPECTOMY Right 1996   right breast, lumpectomy w/nodes  . CHONDROPLASTY Left 02/10/2018   Procedure: CHONDROPLASTY;  Surgeon: Lovell Sheehan, MD;  Location: [facility];  Service: Orthopedics;  Laterality: Left;  Marland Kitchen GASTRIC RESTRICTION SURGERY     for reflux  . HYSTEROSCOPY WITH D & C N/A 10/18/2012   Procedure: DILATATION AND CURETTAGE /HYSTEROSCOPY;  Surgeon: Mora Bellman, MD;  Location: [facility];  Service: Gynecology;  Laterality: N/A;  . KNEE ARTHROSCOPY WITH MEDIAL MENISECTOMY Left 02/10/2018   Procedure: KNEE ARTHROSCOPY WITH MEDIAL and LATERAL MENISECTOMY;  Surgeon: Lovell Sheehan, MD;  Location: [facility];  Service: Orthopedics;  Laterality: Left;  . POLYPECTOMY N/A 10/18/2012   Procedure: POLYPECTOMY;  Surgeon: Mora Bellman, MD;  Location: [facility];  Service: Gynecology;  Laterality: N/A;  . SYNOVECTOMY Left 02/10/2018   Procedure:  SYNOVECTOMY;  Surgeon: Lovell Sheehan, MD;  Location: [facility];  Service: Orthopedics;  Laterality: Left;    SOCIAL HISTORY: Social History   Socioeconomic History  . Marital status: Single    Spouse name: Not on file  . Number of children: 0  . Years of education: Not on file  . Highest education level: Not on file  Occupational History  . Occupation: retired    Fish farm manager: RETIRED  Tobacco Use  . Smoking status: Never Smoker  . Smokeless tobacco: Never Used  Vaping Use  . Vaping Use: Never used  Substance and Sexual Activity  . Alcohol use: Never    Alcohol/week: 0.0 standard drinks  . Drug use: No  . Sexual activity: Not Currently    Birth control/protection: Post-menopausal  Other Topics Concern  . Not on file  Social History Narrative   Lives with, and cares for her elderly mother.   Social Determinants of Health   Financial Resource Strain: Not on file  Food Insecurity: Not on file  Transportation Needs: Not on file  Physical Activity: Not on file  Stress: Not on file  Social Connections: Not on file  Intimate Partner Violence: Not on file    FAMILY HISTORY: Family History  Problem Relation Age of Onset  . Diabetes Mother   . Heart disease Mother        Pacemaker, CHF  . Hypertension Mother   . Kidney cancer Mother   . Stroke Father   . CAD Father 29       Died with MI  . Esophageal cancer Brother   . Heart disease Sister   . Colon cancer Neg Hx   . Rectal cancer Neg Hx   . Stomach cancer Neg Hx   . Pancreatic cancer Neg Hx     ALLERGIES:  is allergic to cholestyramine, lipitor [atorvastatin], niacin, and simvastatin.  MEDICATIONS:  Current Outpatient Medications  Medication Sig Dispense Refill  . Accu-Chek Softclix Lancets lancets TEST BLOOD SUGAR EVERY DAY FOR DIABETES 100 each 3  . acetaminophen (TYLENOL) 500 MG tablet Take 1,000 mg by mouth every 6 (six) hours as needed for mild pain.     . Alcohol Swabs (B-D SINGLE USE SWABS REGULAR) PADS  USE ONE TIME DAILY AS DIRECTED WHEN CHECKING BLOOD SUGAR 100 each 3  . amitriptyline (ELAVIL) 25 MG tablet TAKE 1 TABLET AT BEDTIME 90 tablet 3  . aspirin 81 MG tablet Take 81 mg by mouth at bedtime.     . Blood Glucose Calibration (ACCU-CHEK AVIVA) SOLN Use to calibrate meter 3 each 3  . Blood Glucose Monitoring Suppl (ACCU-CHEK AVIVA PLUS) w/Device KIT Use to check blood sugar once daily for DM (dx. E11.9) 1 kit 0  . calcium elemental as carbonate (BARIATRIC TUMS ULTRA) 400 MG chewable tablet Chew 1,000 mg by mouth daily as needed for heartburn.    . Cholecalciferol (VITAMIN D) 2000 units tablet Take 2,000 Units by mouth daily.    . fenofibrate (TRICOR) 145 MG tablet TAKE 1 TABLET EVERY DAY 90 tablet 3  . glipiZIDE (  GLUCOTROL XL) 2.5 MG 24 hr tablet TAKE 1 TABLET EVERY EVENING 90 tablet 3  . glucose blood (ACCU-CHEK AVIVA PLUS) test strip TEST BLOOD SUGAR EVERY DAY FOR DIABETES (Dx. E11.9) 100 strip 1  . loratadine (CLARITIN) 10 MG tablet Take 10 mg by mouth daily as needed for allergies.    Marland Kitchen losartan (COZAAR) 50 MG tablet TAKE 1 TABLET EVERY DAY 90 tablet 3  . meclizine (ANTIVERT) 25 MG tablet Take 1 tablet (25 mg total) by mouth 3 (three) times daily as needed for dizziness. 90 tablet 3  . metFORMIN (GLUCOPHAGE) 1000 MG tablet Take 0.5 tablets (500 mg total) by mouth 2 (two) times daily with a meal. TAKE 1/2 (579m) TABLET TWICE DAILY WITH A MEAL 1 tablet 0  . omeprazole (PRILOSEC) 40 MG capsule TAKE 1 CAPSULE IN THE MORNING AND AT BEDTIME. 180 capsule 1  . ondansetron (ZOFRAN) 4 MG tablet Take 1 tablet (4 mg total) by mouth every 4 (four) hours as needed for nausea or vomiting. 90 tablet 2  . SALINE NASAL SPRAY NA Place 1 spray into the nose daily as needed (congestion).     .Marland Kitchenamoxicillin-clavulanate (AUGMENTIN) 875-125 MG tablet Take 1 tablet by mouth 2 (two) times daily. 14 tablet 0   No current facility-administered medications for this visit.     PHYSICAL EXAMINATION: ECOG  PERFORMANCE STATUS: 1 - Symptomatic but completely ambulatory Vitals:   11/02/20 1313  BP: (!) 164/88  Pulse: (!) 109  Resp: 20  Temp: (!) 96.3 F (35.7 C)  SpO2: 100%   Filed Weights   11/02/20 1313  Weight: 164 lb (74.4 kg)    Physical Exam Constitutional:      General: She is not in acute distress. HENT:     Head: Normocephalic and atraumatic.  Eyes:     General: No scleral icterus. Cardiovascular:     Rate and Rhythm: Normal rate and regular rhythm.     Heart sounds: Normal heart sounds.  Pulmonary:     Effort: Pulmonary effort is normal. No respiratory distress.     Breath sounds: No wheezing.  Abdominal:     General: Bowel sounds are normal. There is no distension.     Palpations: Abdomen is soft.  Musculoskeletal:        General: No deformity. Normal range of motion.     Cervical back: Normal range of motion and neck supple.  Skin:    General: Skin is warm and dry.     Findings: No erythema or rash.  Neurological:     Mental Status: She is alert and oriented to person, place, and time. Mental status is at baseline.     Cranial Nerves: No cranial nerve deficit.     Coordination: Coordination normal.  Psychiatric:        Mood and Affect: Mood normal.      LABORATORY DATA:  I have reviewed the data as listed Lab Results  Component Value Date   WBC 7.5 10/31/2020   HGB 10.8 (L) 10/31/2020   HCT 34.3 (L) 10/31/2020   MCV 86.2 10/31/2020   PLT 406 (H) 10/31/2020   Recent Labs    01/13/20 0803 03/02/20 0819 07/19/20 0741 07/24/20 0840 08/08/20 0859 08/27/20 1611  NA 137  --  136 136 136 136  K 3.9  --  4.2 4.2 4.5 4.8  CL 99  --  99 101 102 103  CO2 25  --  _0 21*  GLUCOSE 126*  --  111* 105* 89 108*  BUN 22  --  30* 27* 19 26*  CREATININE 1.13  --  1.68* 1.53* 1.23* 1.36*  CALCIUM 9.4  --  9.7 9.5 9.7 9.5  GFRNONAA  --   --   --   --   --  42*  PROT 7.5  --  7.5  --   --  7.9  ALBUMIN 4.1  --  4.2  --   --  3.8  AST _0 --   --   27  ALT _1 --   --  20  ALKPHOS 38*  --  28*  --   --  35*  BILITOT 0.3  --  0.3  --   --  0.4   Iron/TIBC/Ferritin/ %Sat    Component Value Date/Time   IRON 39 10/31/2020 1307   TIBC 444 10/31/2020 1307   FERRITIN 35 10/31/2020 1307   IRONPCTSAT 9 (L) 10/31/2020 1307        ASSESSMENT & PLAN:  1. B12 deficiency   2. Other iron deficiency anemia   3. Family history of cancer    Iron deficiency anemia, Labs reviewed and discussed with patient. Hemoglobin has improved from 8.9-10.8, iron saturation 9, ferritin 35, consistent with improved but persistent iron deficiency anemia. She tolerated IV Venofer treatments previously.  Recommend IV Venofer weekly x3.  Etiology of the iron deficiency, unknown.  Patient has had upper endoscopy in 2020 and colonoscopy was in 2014. Recommend patient to follow-up with gastroenterology to see if she needs any repeat scope.-His gastroenterology is Dr. Henrene Pastor in [place].   Vitamin B12 deficiency,  B12 has improve recommend patient to start parenteral vitamin B12 injection thousand MCG monthly x6.  Follow up 6 months  Orders Placed This Encounter  Procedures  . CBC with Differential/Platelet    Standing Status:   Future    Standing Expiration Date:   11/02/2021  . Iron and TIBC    Standing Status:   Future    Standing Expiration Date:   11/02/2021  . Ferritin    Standing Status:   Future    Standing Expiration Date:   11/02/2021    All questions were answered. The patient knows to call the clinic with any problems questions or concerns. Cc. Tower, Wynelle Fanny, MD   Earlie Server, MD, PhD 11/03/2020

## 2020-11-07 ENCOUNTER — Telehealth: Payer: Self-pay | Admitting: Family Medicine

## 2020-11-07 DIAGNOSIS — E119 Type 2 diabetes mellitus without complications: Secondary | ICD-10-CM

## 2020-11-07 DIAGNOSIS — I10 Essential (primary) hypertension: Secondary | ICD-10-CM

## 2020-11-07 NOTE — Telephone Encounter (Signed)
-----   Message from Cloyd Stagers, RT sent at 10/23/2020  1:29 PM EDT ----- Regarding: Lab Orders for Thursday 4.28.2022 Please place lab orders for Thursday 4.28.2022, appt notes state "3 month f/u labs" Thank you, Dyke Maes RT(R)

## 2020-11-08 ENCOUNTER — Other Ambulatory Visit: Payer: Self-pay

## 2020-11-08 ENCOUNTER — Other Ambulatory Visit (INDEPENDENT_AMBULATORY_CARE_PROVIDER_SITE_OTHER): Payer: Medicare HMO

## 2020-11-08 DIAGNOSIS — I10 Essential (primary) hypertension: Secondary | ICD-10-CM

## 2020-11-08 DIAGNOSIS — E119 Type 2 diabetes mellitus without complications: Secondary | ICD-10-CM | POA: Diagnosis not present

## 2020-11-08 LAB — COMPREHENSIVE METABOLIC PANEL
ALT: 12 U/L (ref 0–35)
AST: 15 U/L (ref 0–37)
Albumin: 4.1 g/dL (ref 3.5–5.2)
Alkaline Phosphatase: 38 U/L — ABNORMAL LOW (ref 39–117)
BUN: 19 mg/dL (ref 6–23)
CO2: 27 mEq/L (ref 19–32)
Calcium: 9.6 mg/dL (ref 8.4–10.5)
Chloride: 103 mEq/L (ref 96–112)
Creatinine, Ser: 1.2 mg/dL (ref 0.40–1.20)
GFR: 46.1 mL/min — ABNORMAL LOW (ref >60.00)
Glucose, Bld: 117 mg/dL — ABNORMAL HIGH (ref 70–99)
Potassium: 4.5 mEq/L (ref 3.5–5.1)
Sodium: 139 mEq/L (ref 135–145)
Total Bilirubin: 0.2 mg/dL (ref 0.2–1.2)
Total Protein: 7.6 g/dL (ref 6.0–8.3)

## 2020-11-08 LAB — HEMOGLOBIN A1C: Hgb A1c MFr Bld: 6 % (ref 4.6–6.5)

## 2020-11-08 MED ORDER — AMITRIPTYLINE HCL 25 MG PO TABS: 25.0000 mg | ORAL_TABLET | Freq: Every day | ORAL | 1 refills | Status: DC

## 2020-11-08 NOTE — Telephone Encounter (Signed)
Rx was sent to pharmacy on 04/24/20 #90 with 3 refills (pt should still have refills on file with them) but it sounds like there is an issue so will resend to pharmacy

## 2020-11-08 NOTE — Telephone Encounter (Signed)
Pt left v/m that [facility] pharmacy said would need new rx sent to [facility] for Amitriptyline 25 mg taking one tab at hs before they can refill med. Pt will be out of med on 11/13/20.

## 2020-11-10 ENCOUNTER — Encounter: Payer: Self-pay | Admitting: Family Medicine

## 2020-11-12 ENCOUNTER — Other Ambulatory Visit: Payer: Self-pay | Admitting: Family Medicine

## 2020-11-12 MED ORDER — AMITRIPTYLINE HCL 25 MG PO TABS: 25.0000 mg | ORAL_TABLET | Freq: Every day | ORAL | 0 refills | Status: DC

## 2020-11-12 NOTE — Telephone Encounter (Signed)
Last filled on 07/19/19 #90 tabs with 2 refills, last f/u was on 08/08/20 please advise

## 2020-11-22 ENCOUNTER — Encounter: Payer: Self-pay | Admitting: Ophthalmology

## 2020-11-22 ENCOUNTER — Telehealth: Payer: Self-pay | Admitting: Family Medicine

## 2020-11-22 NOTE — Chronic Care Management (AMB) (Signed)
  Chronic Care Management   Note  11/22/2020 Name: Seema Blum MRN: [id number] DOB: 02/06/1951  [name] is a 70 y.o. year old female who is a primary care patient of Tower, Wynelle Fanny, MD. I reached out to Silas Sacramento by phone today in response to a referral sent by Ms. Floreen Comber Batty's PCP, Tower, Wynelle Fanny, MD.   [name] was given information about Chronic Care Management services today including:  1. CCM service includes personalized support from designated clinical staff supervised by her physician, including individualized plan of care and coordination with other care providers 2. 24/7 contact phone numbers for assistance for urgent and routine care needs. 3. Service will only be billed when office clinical staff spend 20 minutes or more in a month to coordinate care. 4. Only one practitioner may furnish and bill the service in a calendar month. 5. The patient may stop CCM services at any time (effective at the end of the month) by phone call to the office staff.   Patient agreed to services and verbal consent obtained.   Follow up plan:   Lauretta Grill [employer] Scheduler

## 2020-11-27 NOTE — Discharge Instructions (Signed)

## 2020-11-28 ENCOUNTER — Other Ambulatory Visit: Payer: Self-pay

## 2020-11-28 ENCOUNTER — Ambulatory Visit: Payer: Medicare HMO | Admitting: Anesthesiology

## 2020-11-28 ENCOUNTER — Encounter
Admission: RE | Disposition: A | Discharge status: Home / Self Care | Payer: Self-pay | Source: Home / Self Care | Attending: Ophthalmology

## 2020-11-28 ENCOUNTER — Ambulatory Visit
Admission: RE | Admit: 2020-11-28 | Discharge: 2020-11-28 | Disposition: A | Discharge status: Home / Self Care | Payer: Medicare HMO | Attending: Ophthalmology | Admitting: Ophthalmology

## 2020-11-28 ENCOUNTER — Encounter: Payer: Self-pay | Admitting: Ophthalmology

## 2020-11-28 DIAGNOSIS — Z7982 Long term (current) use of aspirin: Secondary | ICD-10-CM | POA: Diagnosis not present

## 2020-11-28 DIAGNOSIS — I10 Essential (primary) hypertension: Secondary | ICD-10-CM | POA: Diagnosis not present

## 2020-11-28 DIAGNOSIS — Z7984 Long term (current) use of oral hypoglycemic drugs: Secondary | ICD-10-CM | POA: Diagnosis not present

## 2020-11-28 DIAGNOSIS — Z833 Family history of diabetes mellitus: Secondary | ICD-10-CM | POA: Diagnosis not present

## 2020-11-28 DIAGNOSIS — Z888 Allergy status to other drugs, medicaments and biological substances status: Secondary | ICD-10-CM | POA: Insufficient documentation

## 2020-11-28 DIAGNOSIS — Z800 Family history of malignant neoplasm of digestive organs: Secondary | ICD-10-CM | POA: Insufficient documentation

## 2020-11-28 DIAGNOSIS — K219 Gastro-esophageal reflux disease without esophagitis: Secondary | ICD-10-CM | POA: Insufficient documentation

## 2020-11-28 DIAGNOSIS — Z79899 Other long term (current) drug therapy: Secondary | ICD-10-CM | POA: Diagnosis not present

## 2020-11-28 DIAGNOSIS — H25812 Combined forms of age-related cataract, left eye: Secondary | ICD-10-CM | POA: Diagnosis not present

## 2020-11-28 DIAGNOSIS — H2512 Age-related nuclear cataract, left eye: Secondary | ICD-10-CM | POA: Diagnosis not present

## 2020-11-28 DIAGNOSIS — Z8249 Family history of ischemic heart disease and other diseases of the circulatory system: Secondary | ICD-10-CM | POA: Diagnosis not present

## 2020-11-28 DIAGNOSIS — Z8051 Family history of malignant neoplasm of kidney: Secondary | ICD-10-CM | POA: Insufficient documentation

## 2020-11-28 DIAGNOSIS — Z823 Family history of stroke: Secondary | ICD-10-CM | POA: Diagnosis not present

## 2020-11-28 DIAGNOSIS — E1136 Type 2 diabetes mellitus with diabetic cataract: Secondary | ICD-10-CM | POA: Diagnosis not present

## 2020-11-28 DIAGNOSIS — Z853 Personal history of malignant neoplasm of breast: Secondary | ICD-10-CM | POA: Diagnosis not present

## 2020-11-28 HISTORY — PX: CATARACT EXTRACTION W/PHACO: SHX586

## 2020-11-28 LAB — GLUCOSE, CAPILLARY
Glucose-Capillary: 142 mg/dL — ABNORMAL HIGH (ref 70–99)
Glucose-Capillary: 138 mg/dL — ABNORMAL HIGH (ref 70–99)

## 2020-11-28 SURGERY — PHACOEMULSIFICATION, CATARACT, WITH IOL INSERTION
Anesthesia: Monitor Anesthesia Care | Site: Eye | Laterality: Left

## 2020-11-28 MED ORDER — TETRACAINE HCL 0.5 % OP SOLN
1.0000 [drp] | OPHTHALMIC | Status: DC | PRN
  Administered 2020-11-28 (×3): 1 [drp] via OPHTHALMIC

## 2020-11-28 MED ORDER — NA HYALUR & NA CHOND-NA HYALUR 0.4-0.35 ML IO KIT
PACK | INTRAOCULAR | Status: DC | PRN
  Administered 2020-11-28: 1 mL via INTRAOCULAR

## 2020-11-28 MED ORDER — ARMC OPHTHALMIC DILATING DROPS
1.0000 "application " | OPHTHALMIC | Status: DC | PRN
  Administered 2020-11-28 (×3): 1 via OPHTHALMIC

## 2020-11-28 MED ORDER — FENTANYL CITRATE (PF) 100 MCG/2ML IJ SOLN
INTRAMUSCULAR | Status: DC | PRN
  Administered 2020-11-28: 50 ug via INTRAVENOUS

## 2020-11-28 MED ORDER — LIDOCAINE HCL (PF) 2 % IJ SOLN
INTRAOCULAR | Status: DC | PRN
  Administered 2020-11-28: 1 mL via INTRAMUSCULAR

## 2020-11-28 MED ORDER — BRIMONIDINE TARTRATE-TIMOLOL 0.2-0.5 % OP SOLN
OPHTHALMIC | Status: DC | PRN
  Administered 2020-11-28: 1 [drp] via OPHTHALMIC

## 2020-11-28 MED ORDER — CEFUROXIME OPHTHALMIC INJECTION 1 MG/0.1 ML
INJECTION | OPHTHALMIC | Status: DC | PRN
  Administered 2020-11-28: 0.1 mL via OPHTHALMIC

## 2020-11-28 MED ORDER — MIDAZOLAM HCL 2 MG/2ML IJ SOLN
INTRAMUSCULAR | Status: DC | PRN
  Administered 2020-11-28 (×2): 1 mg via INTRAVENOUS

## 2020-11-28 MED ORDER — LACTATED RINGERS IV SOLN: INTRAVENOUS | Status: DC

## 2020-11-28 SURGICAL SUPPLY — 18 items
CANNULA ANT/CHMB 27GA (MISCELLANEOUS) ×2 IMPLANT
GLOVE SURG ENC TEXT LTX SZ7.5 (GLOVE) ×2 IMPLANT
GLOVE SURG TRIUMPH 8.0 PF LTX (GLOVE) ×2 IMPLANT
GOWN STRL REUS W/ TWL LRG LVL3 (GOWN DISPOSABLE) ×2 IMPLANT
GOWN STRL REUS W/TWL LRG LVL3 (GOWN DISPOSABLE) ×4
LENS IOL TECNIS EYHANCE 15.0 (Intraocular Lens) ×2 IMPLANT
MARKER SKIN DUAL TIP RULER LAB (MISCELLANEOUS) ×2 IMPLANT
NEEDLE CAPSULORHEX 25GA (NEEDLE) ×2 IMPLANT
NEEDLE FILTER BLUNT 18X 1/2SAF (NEEDLE) ×2
NEEDLE FILTER BLUNT 18X1 1/2 (NEEDLE) ×2 IMPLANT
PACK CATARACT BRASINGTON (MISCELLANEOUS) ×2 IMPLANT
PACK EYE AFTER SURG (MISCELLANEOUS) ×2 IMPLANT
PACK OPTHALMIC (MISCELLANEOUS) ×2 IMPLANT
SOLUTION OPHTHALMIC SALT (MISCELLANEOUS) ×2 IMPLANT
SYR 3ML LL SCALE MARK (SYRINGE) ×4 IMPLANT
SYR TB 1ML LUER SLIP (SYRINGE) ×2 IMPLANT
WATER STERILE IRR 250ML POUR (IV SOLUTION) ×2 IMPLANT
WIPE NON LINTING 3.25X3.25 (MISCELLANEOUS) ×2 IMPLANT

## 2020-11-28 NOTE — Anesthesia Procedure Notes (Signed)
Procedure Name: MAC Date/Time: 11/28/2020 10:32 AM Performed by: Silvana Newness, CRNA Pre-anesthesia Checklist: Patient identified, Emergency Drugs available, Suction available, Patient being monitored and Timeout performed Patient Re-evaluated:Patient Re-evaluated prior to induction Oxygen Delivery Method: Nasal cannula Placement Confirmation: positive ETCO2

## 2020-11-28 NOTE — H&P (Signed)
[facility]   Primary Care Physician:  Tower, Wynelle Fanny, MD Ophthalmologist: Dr. Leandrew Koyanagi  Pre-Procedure History & Physical: HPI:  Martha Hamilton is a 70 y.o. female here for ophthalmic surgery.   Past Medical History:  Diagnosis Date  . Allergic rhinitis   . Breast cancer ([place]) 1996   right breast  . DM2 (diabetes mellitus, type 2) ([place])   . GERD (gastroesophageal reflux disease)   . Hx of cardiovascular stress test    a. Lex MV 2/14:  EF 69%, no ischemia  . Hyperlipidemia   . Hypertension   . IDA (iron deficiency anemia) 08/27/2020  . Iron deficiency anemia   . Obesity   . Osteoarthritis    hands  . Ulcer     Past Surgical History:  Procedure Laterality Date  . APPENDECTOMY  1976  . BREAST LUMPECTOMY Right 1996   right breast, lumpectomy w/nodes  . CHONDROPLASTY Left 02/10/2018   Procedure: CHONDROPLASTY;  Surgeon: Lovell Sheehan, MD;  Location: [facility];  Service: Orthopedics;  Laterality: Left;  Marland Kitchen GASTRIC RESTRICTION SURGERY     for reflux  . HYSTEROSCOPY WITH D & C N/A 10/18/2012   Procedure: DILATATION AND CURETTAGE /HYSTEROSCOPY;  Surgeon: Mora Bellman, MD;  Location: [facility];  Service: Gynecology;  Laterality: N/A;  . KNEE ARTHROSCOPY WITH MEDIAL MENISECTOMY Left 02/10/2018   Procedure: KNEE ARTHROSCOPY WITH MEDIAL and LATERAL MENISECTOMY;  Surgeon: Lovell Sheehan, MD;  Location: [facility];  Service: Orthopedics;  Laterality: Left;  . POLYPECTOMY N/A 10/18/2012   Procedure: POLYPECTOMY;  Surgeon: Mora Bellman, MD;  Location: [facility];  Service: Gynecology;  Laterality: N/A;  . SYNOVECTOMY Left 02/10/2018   Procedure: SYNOVECTOMY;  Surgeon: Lovell Sheehan, MD;  Location: [facility];  Service: Orthopedics;  Laterality: Left;    Prior to Admission medications   Medication Sig Start Date End Date Taking? Authorizing Provider  Accu-Chek Softclix Lancets lancets TEST BLOOD SUGAR EVERY DAY FOR DIABETES 04/24/20  Yes Tower, Wynelle Fanny, MD  acetaminophen  (TYLENOL) 500 MG tablet Take 1,000 mg by mouth every 6 (six) hours as needed for mild pain.    Yes [provider]  Alcohol Swabs (B-D SINGLE USE SWABS REGULAR) PADS USE ONE TIME DAILY AS DIRECTED WHEN CHECKING BLOOD SUGAR 04/24/20  Yes Tower, Wynelle Fanny, MD  amitriptyline (ELAVIL) 25 MG tablet Take 1 tablet (25 mg total) by mouth at bedtime. 11/12/20  Yes Tower, Wynelle Fanny, MD  aspirin 81 MG tablet Take 81 mg by mouth at bedtime.    Yes [provider]  Blood Glucose Calibration (ACCU-CHEK AVIVA) SOLN USE TO CALIBRATE METER AS DIRECTED 11/12/20  Yes Tower, Wynelle Fanny, MD  Blood Glucose Monitoring Suppl (ACCU-CHEK AVIVA PLUS) w/Device KIT Use to check blood sugar once daily for DM (dx. E11.9) 11/12/20  Yes Tower, Marne A, MD  Camphor-Menthol-Methyl Sal (SALONPAS) 3.07-19-08 % PTCH Apply topically.   Yes [provider]  Cholecalciferol (VITAMIN D) 2000 units tablet Take 2,000 Units by mouth daily.   Yes [provider]  fenofibrate (TRICOR) 145 MG tablet TAKE 1 TABLET EVERY DAY 11/02/20  Yes Tower, Marne A, MD  glipiZIDE (GLUCOTROL XL) 2.5 MG 24 hr tablet TAKE 1 TABLET EVERY EVENING 04/24/20  Yes Tower, Marne A, MD  glucose blood (ACCU-CHEK AVIVA PLUS) test strip TEST BLOOD SUGAR EVERY DAY FOR DIABETES (Dx. E11.9) 06/29/20  Yes Tower, Wynelle Fanny, MD  loratadine (CLARITIN) 10 MG tablet Take 10 mg by mouth daily as needed  for allergies.   Yes [provider]  losartan (COZAAR) 50 MG tablet TAKE 1 TABLET EVERY DAY 04/24/20  Yes Tower, Wynelle Fanny, MD  metFORMIN (GLUCOPHAGE) 1000 MG tablet Take 0.5 tablets (500 mg total) by mouth 2 (two) times daily with a meal. TAKE 1/2 (514m) TABLET TWICE DAILY WITH A MEAL 08/08/20  Yes Tower, Marne A, MD  omeprazole (PRILOSEC) 40 MG capsule TAKE 1 CAPSULE IN THE MORNING AND AT BEDTIME. 11/01/20  Yes PIrene Shipper MD  ondansetron (ZOFRAN) 4 MG tablet TAKE 1 TABLET EVERY 4 HOURS AS NEEDED FOR NAUSEA AND VOMITING 11/12/20  Yes Tower, MWynelle Fanny MD  SALINE  NASAL SPRAY NA Place 1 spray into the nose daily as needed (congestion).    Yes [provider]  calcium elemental as carbonate (BARIATRIC TUMS ULTRA) 400 MG chewable tablet Chew 1,000 mg by mouth daily as needed for heartburn. Patient not taking: Reported on 11/22/2020    [provider]  meclizine (ANTIVERT) 25 MG tablet Take 1 tablet (25 mg total) by mouth 3 (three) times daily as needed for dizziness. Patient not taking: Reported on 11/22/2020 07/19/19   TAbner Greenspan MD    Allergies as of 10/01/2020 - Review Complete 09/06/2020  Allergen Reaction Noted  . Cholestyramine Other (See Comments)   . Lipitor [atorvastatin] Other (See Comments)   . Niacin Nausea And Vomiting   . Simvastatin  03/16/2014    Family History  Problem Relation Age of Onset  . Diabetes Mother   . Heart disease Mother        Pacemaker, CHF  . Hypertension Mother   . Kidney cancer Mother   . Stroke Father   . CAD Father 616      Died with MI  . Esophageal cancer Brother   . Heart disease Sister   . Colon cancer Neg Hx   . Rectal cancer Neg Hx   . Stomach cancer Neg Hx   . Pancreatic cancer Neg Hx     Social History   Socioeconomic History  . Marital status: Single    Spouse name: Not on file  . Number of children: 0  . Years of education: Not on file  . Highest education level: Not on file  Occupational History  . Occupation: retired    [employer] farm manager RETIRED  Tobacco Use  . Smoking status: Never Smoker  . Smokeless tobacco: Never Used  Vaping Use  . Vaping Use: Never used  Substance and Sexual Activity  . Alcohol use: Never    Alcohol/week: 0.0 standard drinks  . Drug use: No  . Sexual activity: Not Currently    Birth control/protection: Post-menopausal  Other Topics Concern  . Not on file  Social History Narrative   Lives with, and cares for her elderly mother.   Social Determinants of Health   Financial Resource Strain: Not on file  Food Insecurity: Not on file   Transportation Needs: Not on file  Physical Activity: Not on file  Stress: Not on file  Social Connections: Not on file  Intimate Partner Violence: Not on file    Review of Systems: See HPI, otherwise negative ROS  Physical Exam: Ht 4' 11.5" (1.511 m)   Wt 73.5 kg   LMP 06/13/2001   BMI 32.17 kg/m  General:   Alert,  pleasant and cooperative in NAD Head:  Normocephalic and atraumatic. Lungs:  Clear to auscultation.    Heart:  Regular rate and rhythm.   Impression/Plan: MFloreen Comber  Hamilton is here for ophthalmic surgery.  Risks, benefits, limitations, and alternatives regarding ophthalmic surgery have been reviewed with the patient.  Questions have been answered.  All parties agreeable.   Leandrew Koyanagi, MD  11/28/2020, 9:36 AM

## 2020-11-28 NOTE — Transfer of Care (Signed)
Immediate Anesthesia Transfer of Care Note  Patient: Martha Hamilton  Procedure(s) Performed: CATARACT EXTRACTION PHACO AND INTRAOCULAR LENS PLACEMENT (IOC) LEFT DIABETIC (Left Eye)  Patient Location: PACU  Anesthesia Type: MAC  Level of Consciousness: awake, alert  and patient cooperative  Airway and Oxygen Therapy: Patient Spontanous Breathing and Patient connected to supplemental oxygen  Post-op Assessment: Post-op Vital signs reviewed, Patient's Cardiovascular Status Stable, Respiratory Function Stable, Patent Airway and No signs of Nausea or vomiting  Post-op Vital Signs: Reviewed and stable  Complications: No complications documented.

## 2020-11-28 NOTE — Anesthesia Postprocedure Evaluation (Signed)
Anesthesia Post Note  Patient: Martha Hamilton  Procedure(s) Performed: CATARACT EXTRACTION PHACO AND INTRAOCULAR LENS PLACEMENT (IOC) LEFT DIABETIC (Left Eye)     Patient location during evaluation: PACU Anesthesia Type: MAC Level of consciousness: awake and alert Pain management: pain level controlled Vital Signs Assessment: post-procedure vital signs reviewed and stable Respiratory status: spontaneous breathing Cardiovascular status: stable Anesthetic complications: no   No complications documented.  Gillian Scarce

## 2020-11-28 NOTE — Op Note (Signed)
  OPERATIVE NOTE  Martha Hamilton [id number] 11/28/2020   PREOPERATIVE DIAGNOSIS:  Nuclear sclerotic cataract left eye. H25.12   POSTOPERATIVE DIAGNOSIS:    Nuclear sclerotic cataract left eye.     PROCEDURE:  Phacoemusification with posterior chamber intraocular lens placement of the left eye  Ultrasound time: Procedure(s) with comments: CATARACT EXTRACTION PHACO AND INTRAOCULAR LENS PLACEMENT (IOC) LEFT DIABETIC (Left) - 5.02 01:07.4 7.4%  LENS:   Implant Name Type Inv. Item Serial No. Manufacturer Lot No. LRB No. Used Action  LENS IOL TECNIS EYHANCE 15.0 - [id number] Intraocular Lens LENS IOL TECNIS EYHANCE 15.0 [id number] JOHNSON   Left 1 Implanted      SURGEON:  Wyonia Hough, MD   ANESTHESIA:  Topical with tetracaine drops and 2% Xylocaine jelly, augmented with 1% preservative-free intracameral lidocaine.    COMPLICATIONS:  None.   DESCRIPTION OF PROCEDURE:  The patient was identified in the holding room and transported to the operating room and placed in the supine position under the operating microscope.  The left eye was identified as the operative eye and it was prepped and draped in the usual sterile ophthalmic fashion.   A 1 millimeter clear-corneal paracentesis was made at the 1:30 position.  0.5 ml of preservative-free 1% lidocaine was injected into the anterior chamber.  The anterior chamber was filled with Viscoat viscoelastic.  A 2.4 millimeter keratome was used to make a near-clear corneal incision at the 10:30 position.  .  A curvilinear capsulorrhexis was made with a cystotome and capsulorrhexis forceps.  Balanced salt solution was used to hydrodissect and hydrodelineate the nucleus.   Phacoemulsification was then used in stop and chop fashion to remove the lens nucleus and epinucleus.  The remaining cortex was then removed using the irrigation and aspiration handpiece. Provisc was then placed into the capsular bag to distend it for lens placement.  A  lens was then injected into the capsular bag.  The remaining viscoelastic was aspirated.   Wounds were hydrated with balanced salt solution.  The anterior chamber was inflated to a physiologic pressure with balanced salt solution.  No wound leaks were noted. Cefuroxime 0.1 ml of a 10mg /ml solution was injected into the anterior chamber for a dose of 1 mg of intracameral antibiotic at the completion of the case.   Timolol and Brimonidine drops were applied to the eye.  The patient was taken to the recovery room in stable condition without complications of anesthesia or surgery.  [name] 11/28/2020, 10:53 AM

## 2020-11-28 NOTE — Anesthesia Preprocedure Evaluation (Signed)
Anesthesia Evaluation  Patient identified by MRN, date of birth, ID band Patient awake    Reviewed: Allergy & Precautions, H&P , NPO status , Patient's Chart, lab work & pertinent test results  Airway Mallampati: II  TM Distance: >3 FB Neck ROM: full    Dental no notable dental hx.    Pulmonary neg pulmonary ROS,    Pulmonary exam normal        Cardiovascular hypertension, On Medications Normal cardiovascular exam Rhythm:regular Rate:Normal     Neuro/Psych negative neurological ROS     GI/Hepatic Neg liver ROS, Medicated,  Endo/Other  diabetes, Well Controlled, Type 2  Renal/GU negative Renal ROS  negative genitourinary   Musculoskeletal   Abdominal   Peds  Hematology negative hematology ROS (+)   Anesthesia Other Findings   Reproductive/Obstetrics                             Anesthesia Physical Anesthesia Plan  ASA: II  Anesthesia Plan: MAC   Post-op Pain Management:    Induction:   PONV Risk Score and Plan: 2 and Treatment may vary due to age or medical condition  Airway Management Planned:   Additional Equipment:   Intra-op Plan:   Post-operative Plan:   Informed Consent: I have reviewed the patients History and Physical, chart, labs and discussed the procedure including the risks, benefits and alternatives for the proposed anesthesia with the patient or authorized representative who has indicated his/her understanding and acceptance.       Plan Discussed with:   Anesthesia Plan Comments:         Anesthesia Quick Evaluation

## 2020-11-29 ENCOUNTER — Encounter: Payer: Self-pay | Admitting: Ophthalmology

## 2020-11-30 ENCOUNTER — Encounter: Payer: Self-pay | Admitting: Ophthalmology

## 2020-12-03 ENCOUNTER — Inpatient Hospital Stay: Payer: Medicare HMO | Attending: Oncology

## 2020-12-03 ENCOUNTER — Other Ambulatory Visit: Payer: Self-pay

## 2020-12-03 DIAGNOSIS — D508 Other iron deficiency anemias: Secondary | ICD-10-CM | POA: Diagnosis not present

## 2020-12-03 DIAGNOSIS — E538 Deficiency of other specified B group vitamins: Secondary | ICD-10-CM | POA: Diagnosis not present

## 2020-12-03 MED ORDER — CYANOCOBALAMIN 1000 MCG/ML IJ SOLN
1000.0000 ug | Freq: Once | INTRAMUSCULAR | Status: AC
  Administered 2020-12-03: 1000 ug via INTRAMUSCULAR
  Filled 2020-12-03: qty 1

## 2020-12-07 ENCOUNTER — Other Ambulatory Visit: Payer: Self-pay | Admitting: Family Medicine

## 2020-12-07 NOTE — Discharge Instructions (Signed)

## 2020-12-09 DIAGNOSIS — Z20822 Contact with and (suspected) exposure to covid-19: Secondary | ICD-10-CM | POA: Diagnosis not present

## 2020-12-11 DIAGNOSIS — H2511 Age-related nuclear cataract, right eye: Secondary | ICD-10-CM | POA: Diagnosis not present

## 2020-12-12 ENCOUNTER — Encounter
Admission: RE | Disposition: A | Discharge status: Home / Self Care | Payer: Self-pay | Source: Home / Self Care | Attending: Ophthalmology

## 2020-12-12 ENCOUNTER — Other Ambulatory Visit: Payer: Self-pay

## 2020-12-12 ENCOUNTER — Ambulatory Visit
Admission: RE | Admit: 2020-12-12 | Discharge: 2020-12-12 | Disposition: A | Discharge status: Home / Self Care | Payer: Medicare HMO | Attending: Ophthalmology | Admitting: Ophthalmology

## 2020-12-12 ENCOUNTER — Encounter: Payer: Self-pay | Admitting: Ophthalmology

## 2020-12-12 ENCOUNTER — Ambulatory Visit: Payer: Medicare HMO | Admitting: Anesthesiology

## 2020-12-12 DIAGNOSIS — E1136 Type 2 diabetes mellitus with diabetic cataract: Secondary | ICD-10-CM | POA: Insufficient documentation

## 2020-12-12 DIAGNOSIS — Z888 Allergy status to other drugs, medicaments and biological substances status: Secondary | ICD-10-CM | POA: Diagnosis not present

## 2020-12-12 DIAGNOSIS — Z7984 Long term (current) use of oral hypoglycemic drugs: Secondary | ICD-10-CM | POA: Insufficient documentation

## 2020-12-12 DIAGNOSIS — Z961 Presence of intraocular lens: Secondary | ICD-10-CM | POA: Insufficient documentation

## 2020-12-12 DIAGNOSIS — Z9842 Cataract extraction status, left eye: Secondary | ICD-10-CM | POA: Insufficient documentation

## 2020-12-12 DIAGNOSIS — Z833 Family history of diabetes mellitus: Secondary | ICD-10-CM | POA: Diagnosis not present

## 2020-12-12 DIAGNOSIS — Z79899 Other long term (current) drug therapy: Secondary | ICD-10-CM | POA: Insufficient documentation

## 2020-12-12 DIAGNOSIS — Z6832 Body mass index (BMI) 32.0-32.9, adult: Secondary | ICD-10-CM | POA: Diagnosis not present

## 2020-12-12 DIAGNOSIS — E669 Obesity, unspecified: Secondary | ICD-10-CM | POA: Insufficient documentation

## 2020-12-12 DIAGNOSIS — H2511 Age-related nuclear cataract, right eye: Secondary | ICD-10-CM | POA: Diagnosis not present

## 2020-12-12 DIAGNOSIS — H25811 Combined forms of age-related cataract, right eye: Secondary | ICD-10-CM | POA: Diagnosis not present

## 2020-12-12 DIAGNOSIS — Z7982 Long term (current) use of aspirin: Secondary | ICD-10-CM | POA: Insufficient documentation

## 2020-12-12 HISTORY — PX: CATARACT EXTRACTION W/PHACO: SHX586

## 2020-12-12 LAB — GLUCOSE, CAPILLARY
Glucose-Capillary: 168 mg/dL — ABNORMAL HIGH (ref 70–99)
Glucose-Capillary: 146 mg/dL — ABNORMAL HIGH (ref 70–99)

## 2020-12-12 SURGERY — PHACOEMULSIFICATION, CATARACT, WITH IOL INSERTION
Anesthesia: Monitor Anesthesia Care | Site: Eye | Laterality: Right

## 2020-12-12 MED ORDER — LACTATED RINGERS IV SOLN: INTRAVENOUS | Status: DC

## 2020-12-12 MED ORDER — BRIMONIDINE TARTRATE-TIMOLOL 0.2-0.5 % OP SOLN
OPHTHALMIC | Status: DC | PRN
  Administered 2020-12-12: 1 [drp] via OPHTHALMIC

## 2020-12-12 MED ORDER — ACETAMINOPHEN 325 MG PO TABS
325.0000 mg | ORAL_TABLET | ORAL | Status: DC | PRN
Start: 2020-12-12 — End: 2020-12-12

## 2020-12-12 MED ORDER — FENTANYL CITRATE (PF) 100 MCG/2ML IJ SOLN
INTRAMUSCULAR | Status: DC | PRN
  Administered 2020-12-12: 50 ug via INTRAVENOUS

## 2020-12-12 MED ORDER — TETRACAINE HCL 0.5 % OP SOLN
1.0000 [drp] | OPHTHALMIC | Status: DC | PRN
  Administered 2020-12-12 (×3): 1 [drp] via OPHTHALMIC

## 2020-12-12 MED ORDER — MIDAZOLAM HCL 2 MG/2ML IJ SOLN
INTRAMUSCULAR | Status: DC | PRN
  Administered 2020-12-12: 1 mg via INTRAVENOUS

## 2020-12-12 MED ORDER — NA HYALUR & NA CHOND-NA HYALUR 0.4-0.35 ML IO KIT
PACK | INTRAOCULAR | Status: DC | PRN
  Administered 2020-12-12: 1 mL via INTRAOCULAR

## 2020-12-12 MED ORDER — LIDOCAINE HCL (PF) 2 % IJ SOLN
INTRAOCULAR | Status: DC | PRN
  Administered 2020-12-12: 1 mL

## 2020-12-12 MED ORDER — ARMC OPHTHALMIC DILATING DROPS
1.0000 "application " | OPHTHALMIC | Status: DC | PRN
  Administered 2020-12-12 (×3): 1 via OPHTHALMIC

## 2020-12-12 MED ORDER — ONDANSETRON HCL 4 MG/2ML IJ SOLN: 4.0000 mg | Freq: Once | INTRAMUSCULAR | Status: DC | PRN

## 2020-12-12 MED ORDER — EPINEPHRINE PF 1 MG/ML IJ SOLN
INTRAOCULAR | Status: DC | PRN
  Administered 2020-12-12: 72 mL via OPHTHALMIC

## 2020-12-12 MED ORDER — CEFUROXIME OPHTHALMIC INJECTION 1 MG/0.1 ML
INJECTION | OPHTHALMIC | Status: DC | PRN
  Administered 2020-12-12: 0.1 mL via INTRACAMERAL

## 2020-12-12 MED ORDER — ACETAMINOPHEN 160 MG/5ML PO SOLN: 325.0000 mg | ORAL | Status: DC | PRN

## 2020-12-12 SURGICAL SUPPLY — 18 items
CANNULA ANT/CHMB 27GA (MISCELLANEOUS) ×2 IMPLANT
GLOVE SURG ENC TEXT LTX SZ7.5 (GLOVE) ×2 IMPLANT
GLOVE SURG TRIUMPH 8.0 PF LTX (GLOVE) ×2 IMPLANT
GOWN STRL REUS W/ TWL LRG LVL3 (GOWN DISPOSABLE) ×2 IMPLANT
GOWN STRL REUS W/TWL LRG LVL3 (GOWN DISPOSABLE) ×4
LENS IOL TECNIS EYHANCE 15.0 (Intraocular Lens) ×2 IMPLANT
MARKER SKIN DUAL TIP RULER LAB (MISCELLANEOUS) ×2 IMPLANT
NEEDLE CAPSULORHEX 25GA (NEEDLE) ×2 IMPLANT
NEEDLE FILTER BLUNT 18X 1/2SAF (NEEDLE) ×2
NEEDLE FILTER BLUNT 18X1 1/2 (NEEDLE) ×2 IMPLANT
PACK CATARACT BRASINGTON (MISCELLANEOUS) ×2 IMPLANT
PACK EYE AFTER SURG (MISCELLANEOUS) ×2 IMPLANT
PACK OPTHALMIC (MISCELLANEOUS) ×2 IMPLANT
SOLUTION OPHTHALMIC SALT (MISCELLANEOUS) ×2 IMPLANT
SYR 3ML LL SCALE MARK (SYRINGE) ×4 IMPLANT
SYR TB 1ML LUER SLIP (SYRINGE) ×2 IMPLANT
WATER STERILE IRR 250ML POUR (IV SOLUTION) ×2 IMPLANT
WIPE NON LINTING 3.25X3.25 (MISCELLANEOUS) ×2 IMPLANT

## 2020-12-12 NOTE — Op Note (Signed)
  LOCATION:  [place]   PREOPERATIVE DIAGNOSIS:    Nuclear sclerotic cataract right eye. H25.11   POSTOPERATIVE DIAGNOSIS:  Nuclear sclerotic cataract right eye.     PROCEDURE:  Phacoemusification with posterior chamber intraocular lens placement of the right eye   ULTRASOUND TIME: Procedure(s): CATARACT EXTRACTION PHACO AND INTRAOCULAR LENS PLACEMENT (IOC) RIGHT DIABETIC 5.22 00:59.2 (Right)  LENS:   Implant Name Type Inv. Item Serial No. Manufacturer Lot No. LRB No. Used Action  LENS IOL TECNIS EYHANCE 15.0 - [id number] Intraocular Lens LENS IOL TECNIS EYHANCE 15.0 [id number] JOHNSON   Right 1 Implanted         SURGEON:  Wyonia Hough, MD   ANESTHESIA:  Topical with tetracaine drops and 2% Xylocaine jelly, augmented with 1% preservative-free intracameral lidocaine.    COMPLICATIONS:  None.   DESCRIPTION OF PROCEDURE:  The patient was identified in the holding room and transported to the operating room and placed in the supine position under the operating microscope.  The right eye was identified as the operative eye and it was prepped and draped in the usual sterile ophthalmic fashion.   A 1 millimeter clear-corneal paracentesis was made at the 12:00 position.  0.5 ml of preservative-free 1% lidocaine was injected into the anterior chamber. The anterior chamber was filled with Viscoat viscoelastic.  A 2.4 millimeter keratome was used to make a near-clear corneal incision at the 9:00 position.  A curvilinear capsulorrhexis was made with a cystotome and capsulorrhexis forceps.  Balanced salt solution was used to hydrodissect and hydrodelineate the nucleus.   Phacoemulsification was then used in stop and chop fashion to remove the lens nucleus and epinucleus.  The remaining cortex was then removed using the irrigation and aspiration handpiece. Provisc was then placed into the capsular bag to distend it for lens placement.  A lens was then injected into the capsular  bag.  The remaining viscoelastic was aspirated.   Wounds were hydrated with balanced salt solution.  The anterior chamber was inflated to a physiologic pressure with balanced salt solution.  No wound leaks were noted. Cefuroxime 0.1 ml of a 10mg /ml solution was injected into the anterior chamber for a dose of 1 mg of intracameral antibiotic at the completion of the case.   Timolol and Brimonidine drops were applied to the eye.  The patient was taken to the recovery room in stable condition without complications of anesthesia or surgery.   Hazen Brumett 12/12/2020, 8:38 AM

## 2020-12-12 NOTE — Transfer of Care (Signed)
Immediate Anesthesia Transfer of Care Note  Patient: Martha Hamilton  Procedure(s) Performed: CATARACT EXTRACTION PHACO AND INTRAOCULAR LENS PLACEMENT (IOC) RIGHT DIABETIC 5.22 00:59.2 (Right Eye)  Patient Location: PACU  Anesthesia Type: MAC  Level of Consciousness: awake, alert  and patient cooperative  Airway and Oxygen Therapy: Patient Spontanous Breathing and Patient connected to supplemental oxygen  Post-op Assessment: Post-op Vital signs reviewed, Patient's Cardiovascular Status Stable, Respiratory Function Stable, Patent Airway and No signs of Nausea or vomiting  Post-op Vital Signs: Reviewed and stable  Complications: No complications documented.

## 2020-12-12 NOTE — H&P (Signed)
[facility]   Primary Care Physician:  Tower, Wynelle Fanny, MD Ophthalmologist: Dr. Leandrew Koyanagi  Pre-Procedure History & Physical: HPI:  Martha Hamilton is a 70 y.o. female here for ophthalmic surgery.   Past Medical History:  Diagnosis Date  . Allergic rhinitis   . Breast cancer ([place]) 1996   right breast  . DM2 (diabetes mellitus, type 2) ([place])   . GERD (gastroesophageal reflux disease)   . Hx of cardiovascular stress test    a. Lex MV 2/14:  EF 69%, no ischemia  . Hyperlipidemia   . Hypertension   . IDA (iron deficiency anemia) 08/27/2020  . Iron deficiency anemia   . Obesity   . Osteoarthritis    hands  . Ulcer     Past Surgical History:  Procedure Laterality Date  . APPENDECTOMY  1976  . BREAST LUMPECTOMY Right 1996   right breast, lumpectomy w/nodes  . CATARACT EXTRACTION W/PHACO Left 11/28/2020   Procedure: CATARACT EXTRACTION PHACO AND INTRAOCULAR LENS PLACEMENT ([place]) LEFT DIABETIC;  Surgeon: Leandrew Koyanagi, MD;  Location: [place];  Service: Ophthalmology;  Laterality: Left;  5.02 01:07.4 7.4%  . CHONDROPLASTY Left 02/10/2018   Procedure: CHONDROPLASTY;  Surgeon: Lovell Sheehan, MD;  Location: [facility];  Service: Orthopedics;  Laterality: Left;  Marland Kitchen GASTRIC RESTRICTION SURGERY     for reflux  . HYSTEROSCOPY WITH D & C N/A 10/18/2012   Procedure: DILATATION AND CURETTAGE /HYSTEROSCOPY;  Surgeon: Mora Bellman, MD;  Location: [facility];  Service: Gynecology;  Laterality: N/A;  . KNEE ARTHROSCOPY WITH MEDIAL MENISECTOMY Left 02/10/2018   Procedure: KNEE ARTHROSCOPY WITH MEDIAL and LATERAL MENISECTOMY;  Surgeon: Lovell Sheehan, MD;  Location: [facility];  Service: Orthopedics;  Laterality: Left;  . POLYPECTOMY N/A 10/18/2012   Procedure: POLYPECTOMY;  Surgeon: Mora Bellman, MD;  Location: [facility];  Service: Gynecology;  Laterality: N/A;  . SYNOVECTOMY Left 02/10/2018   Procedure: SYNOVECTOMY;  Surgeon: Lovell Sheehan, MD;  Location: [facility];   Service: Orthopedics;  Laterality: Left;    Prior to Admission medications   Medication Sig Start Date End Date Taking? Authorizing Provider  acetaminophen (TYLENOL) 500 MG tablet Take 1,000 mg by mouth every 6 (six) hours as needed for mild pain.    Yes [provider]  amitriptyline (ELAVIL) 25 MG tablet Take 1 tablet (25 mg total) by mouth at bedtime. 11/12/20  Yes Tower, Wynelle Fanny, MD  aspirin 81 MG tablet Take 81 mg by mouth at bedtime.    Yes [provider]  Camphor-Menthol-Methyl Sal (SALONPAS) 3.07-19-08 % PTCH Apply topically.   Yes [provider]  fenofibrate (TRICOR) 145 MG tablet TAKE 1 TABLET EVERY DAY 11/02/20  Yes Tower, Marne A, MD  glipiZIDE (GLUCOTROL XL) 2.5 MG 24 hr tablet TAKE 1 TABLET EVERY EVENING 04/24/20  Yes Tower, Marne A, MD  loratadine (CLARITIN) 10 MG tablet Take 10 mg by mouth daily as needed for allergies.   Yes [provider]  losartan (COZAAR) 50 MG tablet TAKE 1 TABLET EVERY DAY 04/24/20  Yes Tower, Wynelle Fanny, MD  metFORMIN (GLUCOPHAGE) 1000 MG tablet Take 0.5 tablets (500 mg total) by mouth 2 (two) times daily with a meal. TAKE 1/2 (526m) TABLET TWICE DAILY WITH A MEAL 08/08/20  Yes Tower, Marne A, MD  omeprazole (PRILOSEC) 40 MG capsule TAKE 1 CAPSULE IN THE MORNING AND AT BEDTIME. 11/01/20  Yes PIrene Shipper MD  ondansetron (ZOFRAN) 4 MG tablet TAKE 1 TABLET EVERY 4  HOURS AS NEEDED FOR NAUSEA AND VOMITING 11/12/20  Yes Tower, Wynelle Fanny, MD  Accu-Chek Softclix Lancets lancets TEST BLOOD SUGAR EVERY DAY FOR DIABETES 04/24/20   Tower, Wynelle Fanny, MD  Alcohol Swabs (B-D SINGLE USE SWABS REGULAR) PADS USE ONE TIME DAILY AS DIRECTED WHEN CHECKING BLOOD SUGAR 04/24/20   Tower, Wynelle Fanny, MD  Blood Glucose Calibration (ACCU-CHEK AVIVA) SOLN USE TO CALIBRATE METER AS DIRECTED 11/12/20   Tower, Wynelle Fanny, MD  Blood Glucose Monitoring Suppl (ACCU-CHEK AVIVA PLUS) w/Device KIT Use to check blood sugar once daily for DM (dx. E11.9) 11/12/20   Tower, Wynelle Fanny,  MD  calcium elemental as carbonate (BARIATRIC TUMS ULTRA) 400 MG chewable tablet Chew 1,000 mg by mouth daily as needed for heartburn. Patient not taking: Reported on 11/22/2020    [provider]  Cholecalciferol (VITAMIN D) 2000 units tablet Take 2,000 Units by mouth daily.    [provider]  glucose blood (ACCU-CHEK AVIVA PLUS) test strip TEST BLOOD SUGAR EVERY DAY FOR DIABETES (Dx. E11.9) 12/07/20   Tower, Wynelle Fanny, MD  meclizine (ANTIVERT) 25 MG tablet Take 1 tablet (25 mg total) by mouth 3 (three) times daily as needed for dizziness. Patient not taking: Reported on 11/22/2020 07/19/19   Tower, Wynelle Fanny, MD  SALINE NASAL SPRAY NA Place 1 spray into the nose daily as needed (congestion).     [provider]    Allergies as of 10/01/2020 - Review Complete 09/06/2020  Allergen Reaction Noted  . Cholestyramine Other (See Comments)   . Lipitor [atorvastatin] Other (See Comments)   . Niacin Nausea And Vomiting   . Simvastatin  03/16/2014    Family History  Problem Relation Age of Onset  . Diabetes Mother   . Heart disease Mother        Pacemaker, CHF  . Hypertension Mother   . Kidney cancer Mother   . Stroke Father   . CAD Father 79       Died with MI  . Esophageal cancer Brother   . Heart disease Sister   . Colon cancer Neg Hx   . Rectal cancer Neg Hx   . Stomach cancer Neg Hx   . Pancreatic cancer Neg Hx     Social History   Socioeconomic History  . Marital status: Single    Spouse name: Not on file  . Number of children: 0  . Years of education: Not on file  . Highest education level: Not on file  Occupational History  . Occupation: retired    Fish farm manager: RETIRED  Tobacco Use  . Smoking status: Never Smoker  . Smokeless tobacco: Never Used  Vaping Use  . Vaping Use: Never used  Substance and Sexual Activity  . Alcohol use: Never    Alcohol/week: 0.0 standard drinks  . Drug use: No  . Sexual activity: Not Currently    Birth  control/protection: Post-menopausal  Other Topics Concern  . Not on file  Social History Narrative   Lives with, and cares for her elderly mother.   Social Determinants of Health   Financial Resource Strain: Not on file  Food Insecurity: Not on file  Transportation Needs: Not on file  Physical Activity: Not on file  Stress: Not on file  Social Connections: Not on file  Intimate Partner Violence: Not on file    Review of Systems: See HPI, otherwise negative ROS  Physical Exam: BP (!) 154/69   Pulse (!) 112   Temp (!) 97.3 F (  36.3 C)   Resp (!) 22   Ht 4' 11.49" (1.511 m)   Wt 74.4 kg   LMP 06/13/2001   BMI 32.58 kg/m  General:   Alert,  pleasant and cooperative in NAD Head:  Normocephalic and atraumatic. Lungs:  Clear to auscultation.    Heart:  Regular rate and rhythm.   Impression/Plan: Martha Hamilton is here for ophthalmic surgery.  Risks, benefits, limitations, and alternatives regarding ophthalmic surgery have been reviewed with the patient.  Questions have been answered.  All parties agreeable.   Leandrew Koyanagi, MD  12/12/2020, 7:40 AM

## 2020-12-12 NOTE — Anesthesia Preprocedure Evaluation (Signed)
Anesthesia Evaluation  Patient identified by MRN, date of birth, ID band Patient awake    Reviewed: Allergy & Precautions, H&P , NPO status , Patient's Chart, lab work & pertinent test results  Airway Mallampati: II  TM Distance: >3 FB Neck ROM: full    Dental no notable dental hx.    Pulmonary neg pulmonary ROS,    Pulmonary exam normal        Cardiovascular Exercise Tolerance: Good hypertension, On Medications Normal cardiovascular exam Rhythm:regular Rate:Normal     Neuro/Psych negative neurological ROS     GI/Hepatic Neg liver ROS, GERD  Medicated,  Endo/Other  diabetes, Well Controlled, Type 2  Renal/GU negative Renal ROS  negative genitourinary   Musculoskeletal  (+) Arthritis ,   Abdominal (+) + obese,  Abdomen: soft.    Peds negative pediatric ROS (+)  Hematology negative hematology ROS (+) anemia ,   Anesthesia Other Findings   Reproductive/Obstetrics                             Anesthesia Physical  Anesthesia Plan  ASA: III  Anesthesia Plan: MAC   Post-op Pain Management:    Induction: Intravenous  PONV Risk Score and Plan: 2 and Treatment may vary due to age or medical condition  Airway Management Planned: Nasal Cannula  Additional Equipment:   Intra-op Plan:   Post-operative Plan:   Informed Consent: I have reviewed the patients History and Physical, chart, labs and discussed the procedure including the risks, benefits and alternatives for the proposed anesthesia with the patient or authorized representative who has indicated his/her understanding and acceptance.     Dental advisory given  Plan Discussed with:   Anesthesia Plan Comments:         Anesthesia Quick Evaluation  Patient Active Problem List   Diagnosis Date Noted  . Family history of cancer 08/27/2020  . History of breast cancer 08/27/2020  . IDA (iron deficiency anemia)  08/27/2020  . B12 deficiency 08/27/2020  . Nausea 07/24/2020  . Decreased GFR 07/24/2020  . Tachycardia 01/11/2019  . Fatty liver 06/21/2018  . Obesity (BMI 30-39.9) 06/21/2018  . Dyspepsia 05/25/2018  . GERD (gastroesophageal reflux disease) 05/25/2018  . H/O vertigo 05/11/2018  . Pre-op examination 01/22/2018  . Thrombocytosis 12/25/2017  . Tear of medial meniscus of knee 12/21/2017  . Left knee pain 09/21/2017  . Low back pain 07/06/2017  . Welcome to Medicare preventive visit 12/17/2016  . Grief reaction 12/17/2016  . Estrogen deficiency 09/08/2016  . Muscle pain 02/08/2016  . Normocytic anemia 02/09/2013  . Abnormal EKG 07/27/2012  . Special screening for malignant neoplasms, colon 04/29/2011  . Gynecological examination 04/29/2011  . Routine general medical examination at a health care facility 04/20/2011  . OSTEOARTHRITIS, HANDS, BILATERAL 10/31/2008  . Diabetes type 2, controlled ([place]) 07/10/2008  . Hyperlipidemia associated with type 2 diabetes mellitus ([place]) 01/26/2008  . Essential hypertension 01/26/2008  . ALLERGIC RHINITIS 12/07/2007    CBC Latest Ref Rng & Units 10/31/2020 08/27/2020 08/08/2020  WBC 4.0 - 10.5 K/uL 7.5 9.3 5.3  Hemoglobin 12.0 - 15.0 g/dL 10.8(L) 8.9(L) 9.2(L)  Hematocrit 36.0 - 46.0 % 34.3(L) 27.8(L) 27.7(L)  Platelets 150 - 400 K/uL 406(H) 666(H) 627.0(H)   BMP Latest Ref Rng & Units 11/08/2020 08/27/2020 08/08/2020  Glucose 70 - 99 mg/dL 117(H) 108(H) 89  BUN 6 - 23 mg/dL 19 26(H) 19  Creatinine 0.40 - 1.20 mg/dL 1.20 1.36(H) 1.23(H)  Sodium 135 - 145 mEq/L 139 136 136  Potassium 3.5 - 5.1 mEq/L 4.5 4.8 4.5  Chloride 96 - 112 mEq/L 103 103 102  CO2 19 - 32 mEq/L 27 21(L) 26  Calcium 8.4 - 10.5 mg/dL 9.6 9.5 9.7    Risks and benefits of anesthesia discussed at length, patient or surrogate demonstrates understanding. Appropriately NPO. Plan to proceed with anesthesia.  Champ Mungo, MD 12/12/20

## 2020-12-12 NOTE — Anesthesia Postprocedure Evaluation (Signed)
Anesthesia Post Note  Patient: Floreen Comber [place]  Procedure(s) Performed: CATARACT EXTRACTION PHACO AND INTRAOCULAR LENS PLACEMENT (IOC) RIGHT DIABETIC 5.22 00:59.2 (Right Eye)     Patient location during evaluation: PACU Anesthesia Type: MAC Level of consciousness: awake and alert Pain management: pain level controlled Vital Signs Assessment: post-procedure vital signs reviewed and stable Respiratory status: spontaneous breathing, nonlabored ventilation, respiratory function stable and patient connected to nasal cannula oxygen Cardiovascular status: stable and blood pressure returned to baseline Postop Assessment: no apparent nausea or vomiting Anesthetic complications: no   No complications documented.  Sinda Du

## 2020-12-12 NOTE — Anesthesia Procedure Notes (Signed)
Procedure Name: MAC Date/Time: 12/12/2020 8:16 AM Performed by: Dionne Bucy, CRNA Pre-anesthesia Checklist: Patient identified, Emergency Drugs available, Suction available, Patient being monitored and Timeout performed Patient Re-evaluated:Patient Re-evaluated prior to induction Oxygen Delivery Method: Nasal cannula Placement Confirmation: positive ETCO2

## 2020-12-13 ENCOUNTER — Encounter: Payer: Self-pay | Admitting: Ophthalmology

## 2020-12-13 ENCOUNTER — Telehealth: Payer: Self-pay

## 2020-12-13 NOTE — Telephone Encounter (Signed)
I spoke with pt; starting on 12/08/20 pt having deep dry cough, drainage down throat, and head congestion. When blows nose cannot get any mucus out. Pt said sharp pain on and off that brings tears to pts eyes on rt side at rib cage when coughing. No CP, SOB. Pt was supposed to have cataract surgery on 12/12/20 and pt went to [facility] on 12/09/20 for covid test and flu test for A & B. All test were negative. Pt is hoarse but no S/T. Pt is going to find a driver to take pt to one of the UC in [place] today for eval and possible CXR. (pt had cataract surgery on 12/12/20 and is not driving yet.) pt will cb with update on condition. Sending FYI to Dr Glori Bickers.

## 2020-12-13 NOTE — Telephone Encounter (Signed)
Thanks for the update  I will watch for correspondence

## 2020-12-14 ENCOUNTER — Ambulatory Visit
Admission: RE | Admit: 2020-12-14 | Discharge: 2020-12-14 | Disposition: A | Discharge status: Home / Self Care | Payer: Medicare HMO | Attending: Emergency Medicine | Admitting: Emergency Medicine

## 2020-12-14 ENCOUNTER — Ambulatory Visit
Admission: RE | Admit: 2020-12-14 | Discharge: 2020-12-14 | Disposition: A | Discharge status: Home / Self Care | Payer: Medicare HMO | Source: Ambulatory Visit | Attending: Emergency Medicine | Admitting: Emergency Medicine

## 2020-12-14 ENCOUNTER — Other Ambulatory Visit: Payer: Self-pay

## 2020-12-14 ENCOUNTER — Encounter: Payer: Self-pay | Admitting: Oncology

## 2020-12-14 ENCOUNTER — Telehealth: Payer: Self-pay

## 2020-12-14 VITALS — BP 149/85 | HR 117 | Temp 99.2°F | Resp 19

## 2020-12-14 DIAGNOSIS — R058 Other specified cough: Secondary | ICD-10-CM

## 2020-12-14 DIAGNOSIS — R059 Cough, unspecified: Secondary | ICD-10-CM | POA: Diagnosis not present

## 2020-12-14 MED ORDER — BENZONATATE 100 MG PO CAPS: 100.0000 mg | ORAL_CAPSULE | Freq: Three times a day (TID) | ORAL | 0 refills | Status: DC | PRN

## 2020-12-14 NOTE — Discharge Instructions (Signed)
Go to [facility] for your x-ray.    [address]  [place], [place] [address] [phone]  I will call you with the results.

## 2020-12-14 NOTE — Telephone Encounter (Signed)
Pt called in to give Dr. Glori Bickers an update on her health since she wasn't able to get in to see her.  Pt was seen at an UC and tested negative for COVID and the flu. Pt was sent for chest xray and all was clear; no pnuemonia/bronchitis. She was prescribed tessalon capsules for her cough.

## 2020-12-14 NOTE — ED Provider Notes (Signed)
Roderic Palau    CSN: [id number] Arrival date & time: 12/14/20  1338      History   Chief Complaint Chief Complaint  Patient presents with  . Cough  . Chest Congestion    HPI Martha Hamilton is a 70 y.o. female.   Patient presents with 1 week history of cough productive of yellow phlegm and chest congestion.  She denies fever, wheezing, shortness of breath, or other symptoms.  She had a negative COVID and flu test 5 days ago.  OTC treatment attempted at home.  Her medical history includes hypertension, obesity, breast cancer, anemia, GERD, arthritis, diabetes.  The history is provided by the patient and medical records.    Past Medical History:  Diagnosis Date  . Allergic rhinitis   . Breast cancer ([place]) 1996   right breast  . DM2 (diabetes mellitus, type 2) ([place])   . GERD (gastroesophageal reflux disease)   . Hx of cardiovascular stress test    a. Lex MV 2/14:  EF 69%, no ischemia  . Hyperlipidemia   . Hypertension   . IDA (iron deficiency anemia) 08/27/2020  . Iron deficiency anemia   . Obesity   . Osteoarthritis    hands  . Ulcer     Patient Active Problem List   Diagnosis Date Noted  . Family history of cancer 08/27/2020  . History of breast cancer 08/27/2020  . IDA (iron deficiency anemia) 08/27/2020  . B12 deficiency 08/27/2020  . Nausea 07/24/2020  . Decreased GFR 07/24/2020  . Tachycardia 01/11/2019  . Fatty liver 06/21/2018  . Obesity (BMI 30-39.9) 06/21/2018  . Dyspepsia 05/25/2018  . GERD (gastroesophageal reflux disease) 05/25/2018  . H/O vertigo 05/11/2018  . Pre-op examination 01/22/2018  . Thrombocytosis 12/25/2017  . Tear of medial meniscus of knee 12/21/2017  . Left knee pain 09/21/2017  . Low back pain 07/06/2017  . Welcome to Medicare preventive visit 12/17/2016  . Grief reaction 12/17/2016  . Estrogen deficiency 09/08/2016  . Muscle pain 02/08/2016  . Normocytic anemia 02/09/2013  . Abnormal EKG 07/27/2012  . Special  screening for malignant neoplasms, colon 04/29/2011  . Gynecological examination 04/29/2011  . Routine general medical examination at a health care facility 04/20/2011  . OSTEOARTHRITIS, HANDS, BILATERAL 10/31/2008  . Diabetes type 2, controlled ([place]) 07/10/2008  . Hyperlipidemia associated with type 2 diabetes mellitus ([place]) 01/26/2008  . Essential hypertension 01/26/2008  . ALLERGIC RHINITIS 12/07/2007    Past Surgical History:  Procedure Laterality Date  . APPENDECTOMY  1976  . BREAST LUMPECTOMY Right 1996   right breast, lumpectomy w/nodes  . CATARACT EXTRACTION W/PHACO Left 11/28/2020   Procedure: CATARACT EXTRACTION PHACO AND INTRAOCULAR LENS PLACEMENT ([place]) LEFT DIABETIC;  Surgeon: Leandrew Koyanagi, MD;  Location: [facility];  Service: Ophthalmology;  Laterality: Left;  5.02 01:07.4 7.4%  . CATARACT EXTRACTION W/PHACO Right 12/12/2020   Procedure: CATARACT EXTRACTION PHACO AND INTRAOCULAR LENS PLACEMENT ([facility]) RIGHT DIABETIC 5.22 00:59.2;  Surgeon: Leandrew Koyanagi, MD;  Location: [facility];  Service: Ophthalmology;  Laterality: Right;  . CHONDROPLASTY Left 02/10/2018   Procedure: CHONDROPLASTY;  Surgeon: Lovell Sheehan, MD;  Location: [facility];  Service: Orthopedics;  Laterality: Left;  Marland Kitchen GASTRIC RESTRICTION SURGERY     for reflux  . HYSTEROSCOPY WITH D & C N/A 10/18/2012   Procedure: DILATATION AND CURETTAGE /HYSTEROSCOPY;  Surgeon: Mora Bellman, MD;  Location: [facility];  Service: Gynecology;  Laterality: N/A;  . KNEE ARTHROSCOPY WITH MEDIAL MENISECTOMY Left 02/10/2018  Procedure: KNEE ARTHROSCOPY WITH MEDIAL and LATERAL MENISECTOMY;  Surgeon: Lovell Sheehan, MD;  Location: [facility];  Service: Orthopedics;  Laterality: Left;  . POLYPECTOMY N/A 10/18/2012   Procedure: POLYPECTOMY;  Surgeon: Mora Bellman, MD;  Location: [facility];  Service: Gynecology;  Laterality: N/A;  . SYNOVECTOMY Left 02/10/2018   Procedure: SYNOVECTOMY;  Surgeon: Lovell Sheehan, MD;   Location: [facility];  Service: Orthopedics;  Laterality: Left;    OB History    Gravida  0   Para  0   Term  0   Preterm  0   AB  0   Living  0     SAB  0   IAB  0   Ectopic  0   Multiple  0   Live Births               Home Medications    Prior to Admission medications   Medication Sig Start Date End Date Taking? Authorizing Provider  amitriptyline (ELAVIL) 25 MG tablet Take 1 tablet (25 mg total) by mouth at bedtime. 11/12/20  Yes Tower, Wynelle Fanny, MD  aspirin 81 MG tablet Take 81 mg by mouth at bedtime.    Yes [provider]  benzonatate (TESSALON) 100 MG capsule Take 1 capsule (100 mg total) by mouth 3 (three) times daily as needed for cough. 12/14/20  Yes Sharion Balloon, NP  Cholecalciferol (VITAMIN D) 2000 units tablet Take 2,000 Units by mouth daily.   Yes [provider]  fenofibrate (TRICOR) 145 MG tablet TAKE 1 TABLET EVERY DAY 11/02/20  Yes Tower, Marne A, MD  glipiZIDE (GLUCOTROL XL) 2.5 MG 24 hr tablet TAKE 1 TABLET EVERY EVENING 04/24/20  Yes Tower, Marne A, MD  loratadine (CLARITIN) 10 MG tablet Take 10 mg by mouth daily as needed for allergies.   Yes [provider]  losartan (COZAAR) 50 MG tablet TAKE 1 TABLET EVERY DAY 04/24/20  Yes Tower, Wynelle Fanny, MD  meclizine (ANTIVERT) 25 MG tablet Take 1 tablet (25 mg total) by mouth 3 (three) times daily as needed for dizziness. 07/19/19  Yes Tower, Wynelle Fanny, MD  metFORMIN (GLUCOPHAGE) 1000 MG tablet Take 0.5 tablets (500 mg total) by mouth 2 (two) times daily with a meal. TAKE 1/2 (545m) TABLET TWICE DAILY WITH A MEAL 08/08/20  Yes Tower, MWynelle Fanny MD  omeprazole (PRILOSEC) 40 MG capsule TAKE 1 CAPSULE IN THE MORNING AND AT BEDTIME. 11/01/20  Yes PIrene Shipper MD  Accu-Chek Softclix Lancets lancets TEST BLOOD SUGAR EVERY DAY FOR DIABETES 04/24/20   Tower, MWynelle Fanny MD  acetaminophen (TYLENOL) 500 MG tablet Take 1,000 mg by mouth every 6 (six) hours as needed for mild pain.     [provider]  Alcohol Swabs (B-D SINGLE USE SWABS REGULAR) PADS USE ONE TIME DAILY AS DIRECTED WHEN CHECKING BLOOD SUGAR 04/24/20   Tower, MWynelle Fanny MD  Blood Glucose Calibration (ACCU-CHEK AVIVA) SOLN USE TO CALIBRATE METER AS DIRECTED 11/12/20   Tower, MWynelle Fanny MD  Blood Glucose Monitoring Suppl (ACCU-CHEK AVIVA PLUS) w/Device KIT Use to check blood sugar once daily for DM (dx. E11.9) 11/12/20   Tower, MWynelle Fanny MD  calcium elemental as carbonate (BARIATRIC TUMS ULTRA) 400 MG chewable tablet Chew 1,000 mg by mouth daily as needed for heartburn. Patient not taking: No sig reported    [provider]  Camphor-Menthol-Methyl Sal (SALONPAS) 3.07-19-08 % PTCH Apply topically.    [provider]  glucose blood (  ACCU-CHEK AVIVA PLUS) test strip TEST BLOOD SUGAR EVERY DAY FOR DIABETES (Dx. E11.9) 12/07/20   Tower, Wynelle Fanny, MD  ondansetron (ZOFRAN) 4 MG tablet TAKE 1 TABLET EVERY 4 HOURS AS NEEDED FOR NAUSEA AND VOMITING 11/12/20   Tower, Wynelle Fanny, MD  SALINE NASAL SPRAY NA Place 1 spray into the nose daily as needed (congestion).     [provider]    Family History Family History  Problem Relation Age of Onset  . Diabetes Mother   . Heart disease Mother        Pacemaker, CHF  . Hypertension Mother   . Kidney cancer Mother   . Stroke Father   . CAD Father 72       Died with MI  . Esophageal cancer Brother   . Heart disease Sister   . Colon cancer Neg Hx   . Rectal cancer Neg Hx   . Stomach cancer Neg Hx   . Pancreatic cancer Neg Hx     Social History Social History   Tobacco Use  . Smoking status: Never Smoker  . Smokeless tobacco: Never Used  Vaping Use  . Vaping Use: Never used  Substance Use Topics  . Alcohol use: Never    Alcohol/week: 0.0 standard drinks  . Drug use: No     Allergies   Cholestyramine, Lipitor [atorvastatin], Niacin, and Simvastatin   Review of Systems Review of Systems  Constitutional: Negative for chills and fever.  HENT:  Negative for ear pain and sore throat.   Respiratory: Positive for cough. Negative for shortness of breath and wheezing.   Cardiovascular: Negative for chest pain and palpitations.  Gastrointestinal: Negative for abdominal pain and vomiting.  Skin: Negative for color change and rash.  All other systems reviewed and are negative.    Physical Exam Triage Vital Signs ED Triage Vitals  Enc Vitals Group     BP 12/14/20 1400 (!) 149/85     Pulse Rate 12/14/20 1400 (!) 117     Resp 12/14/20 1400 19     Temp 12/14/20 1400 99.2 F (37.3 C)     Temp Source 12/14/20 1400 Oral     SpO2 12/14/20 1400 95 %     Weight --      Height --      Head Circumference --      Peak Flow --      Pain Score 12/14/20 1356 0     Pain Loc --      Pain Edu? --      Excl. in [place]? --    No data found.  Updated Vital Signs BP (!) 149/85 (BP Location: Left Arm)   Pulse (!) 117   Temp 99.2 F (37.3 C) (Oral)   Resp 19   LMP 06/13/2001   SpO2 95%   Visual Acuity Right Eye Distance:   Left Eye Distance:   Bilateral Distance:    Right Eye Near:   Left Eye Near:    Bilateral Near:     Physical Exam Vitals and nursing note reviewed.  Constitutional:      General: She is not in acute distress.    Appearance: She is well-developed. She is obese. She is not ill-appearing.  HENT:     Head: Normocephalic and atraumatic.     Right Ear: Tympanic membrane normal.     Left Ear: Tympanic membrane normal.     Nose: Nose normal.     Mouth/Throat:     Mouth: Mucous  membranes are moist.     Pharynx: Oropharynx is clear.  Eyes:     Conjunctiva/sclera: Conjunctivae normal.  Cardiovascular:     Rate and Rhythm: Normal rate and regular rhythm.     Heart sounds: Normal heart sounds.  Pulmonary:     Effort: Pulmonary effort is normal. No respiratory distress.     Breath sounds: Normal breath sounds. No wheezing.  Abdominal:     Palpations: Abdomen is soft.     Tenderness: There is no abdominal tenderness.   Musculoskeletal:     Cervical back: Neck supple.  Skin:    General: Skin is warm and dry.  Neurological:     General: No focal deficit present.     Mental Status: She is alert and oriented to person, place, and time.     Gait: Gait normal.  Psychiatric:        Mood and Affect: Mood normal.        Behavior: Behavior normal.      UC Treatments / Results  Labs (all labs ordered are listed, but only abnormal results are displayed) Labs Reviewed - No data to display  EKG   Radiology DG Chest 2 View  Result Date: 12/14/2020 CLINICAL DATA:  Cough over the last week EXAM: CHEST - 2 VIEW COMPARISON:  06/08/2013 FINDINGS: Heart size is normal. Mediastinal shadows unremarkable. The lungs are clear. No infiltrate, collapse or effusion. Surgical clips in the right axilla. No acute bone finding. IMPRESSION: No active cardiopulmonary disease. Electronically Signed   By: Nelson Chimes M.D.   On: 12/14/2020 15:09    Procedures Procedures (including critical care time)  Medications Ordered in UC Medications - No data to display  Initial Impression / Assessment and Plan / UC Course  I have reviewed the triage vital signs and the nursing notes.  Pertinent labs & imaging results that were available during my care of the patient were reviewed by me and considered in my medical decision making (see chart for details).   Productive cough.  Chest xray negative.  Treating cough with Tessalon Perles.  Instructed patient to follow-up with her PCP if her symptoms or not improving.  ED precautions discussed.  Patient agrees to plan of care.   Final Clinical Impressions(s) / UC Diagnoses   Final diagnoses:  Productive cough     Discharge Instructions     Go to [facility] for your x-ray.    [address]  [place], [place] [address] [phone]  I will call you with the results.      ED Prescriptions    Medication Sig Dispense Auth.  Provider   benzonatate (TESSALON) 100 MG capsule Take 1 capsule (100 mg total) by mouth 3 (three) times daily as needed for cough. 21 capsule Sharion Balloon, NP     PDMP not reviewed this encounter.   Sharion Balloon, NP 12/14/20 1515

## 2020-12-14 NOTE — ED Triage Notes (Signed)
Patient c/o productive cough w/ " yellow" phlegm and chest congestion x 1 week.   Patient denies fever at home.   Patient endorses cough was nonproductive upon onset of symptoms.   Patient denies SOB or Chest.   Patient had a PCR COVID-19 and Flu A/B test done with a negative result (Sunday).   Patient endorses a history of childhood asthma.

## 2020-12-14 NOTE — Telephone Encounter (Signed)
Glad she was seen since I am not in the office.  Let us know if symptome do not improve

## 2020-12-17 ENCOUNTER — Telehealth: Payer: Self-pay

## 2020-12-17 NOTE — Chronic Care Management (AMB) (Addendum)
Chronic Care Management Pharmacy Assistant   Name: Curtisha Bendix  MRN: [id number] DOB: [birth year]  Martha Hamilton is an 70 y.o. year old female who presents for her initial CCM visit with the clinical pharmacist.  Reason for Encounter: Initial Questions   Conditions to be addressed/monitored: HTN, HLD and DMII  Recent office visits:  10/10/20 - Dr.Tower PCP - URI, Started Augmentin course 08/08/20 - Dr.Tower PCP - Labs ordered. Kidney function improved. Continue current medicines and re-check bmp in 3 mo for renal insufficiency. Anemia and iron levels are worse. Refer to hematology. 07/24/20 - Dr.Tower PCP - Kidney function decline. Hold HCTZ. Cut metformin dose in 1/2 (take 500 bid). F/u in 2 weeks.  Recent consult visits:  12/14/20 - [facility] - Cough, started Benzonatate 100 mg - take 1 tablet 3 times a day  11/02/20 - Oncology  Labs ordered  Recommend follow up with Gastroenterology  09/28/20 - [facility]- B12 injection  09/21/20 - [facility]-B12 injection  09/11/20 - [facility]-B12 injection  09/06/20 - Oncology Labs ordered follow up 6 months 08/27/20 - Oncology Labs ordered no medication changes 07/23/20 - Orthopedics No data found  Hospital visits:  None in previous 6 months  Medications: Outpatient Encounter Medications as of 12/17/2020  Medication Sig   Accu-Chek Softclix Lancets lancets TEST BLOOD SUGAR EVERY DAY FOR DIABETES   acetaminophen (TYLENOL) 500 MG tablet Take 1,000 mg by mouth every 6 (six) hours as needed for mild pain.    Alcohol Swabs (B-D SINGLE USE SWABS REGULAR) PADS USE ONE TIME DAILY AS DIRECTED WHEN CHECKING BLOOD SUGAR   amitriptyline (ELAVIL) 25 MG tablet Take 1 tablet (25 mg total) by mouth at bedtime.   aspirin 81 MG tablet Take 81 mg by mouth at bedtime.    benzonatate (TESSALON) 100 MG capsule Take 1 capsule (100 mg total) by mouth 3 (three) times daily as needed for cough.   Blood Glucose Calibration (ACCU-CHEK AVIVA) SOLN USE TO CALIBRATE METER  AS DIRECTED   Blood Glucose Monitoring Suppl (ACCU-CHEK AVIVA PLUS) w/Device KIT Use to check blood sugar once daily for DM (dx. E11.9)   calcium elemental as carbonate (BARIATRIC TUMS ULTRA) 400 MG chewable tablet Chew 1,000 mg by mouth daily as needed for heartburn. (Patient not taking: No sig reported)   Camphor-Menthol-Methyl Sal (SALONPAS) 3.07-19-08 % PTCH Apply topically.   Cholecalciferol (VITAMIN D) 2000 units tablet Take 2,000 Units by mouth daily.   fenofibrate (TRICOR) 145 MG tablet TAKE 1 TABLET EVERY DAY   glipiZIDE (GLUCOTROL XL) 2.5 MG 24 hr tablet TAKE 1 TABLET EVERY EVENING   glucose blood (ACCU-CHEK AVIVA PLUS) test strip TEST BLOOD SUGAR EVERY DAY FOR DIABETES (Dx. E11.9)   loratadine (CLARITIN) 10 MG tablet Take 10 mg by mouth daily as needed for allergies.   losartan (COZAAR) 50 MG tablet TAKE 1 TABLET EVERY DAY   meclizine (ANTIVERT) 25 MG tablet Take 1 tablet (25 mg total) by mouth 3 (three) times daily as needed for dizziness.   metFORMIN (GLUCOPHAGE) 1000 MG tablet Take 0.5 tablets (500 mg total) by mouth 2 (two) times daily with a meal. TAKE 1/2 (549m) TABLET TWICE DAILY WITH A MEAL   omeprazole (PRILOSEC) 40 MG capsule TAKE 1 CAPSULE IN THE MORNING AND AT BEDTIME.   ondansetron (ZOFRAN) 4 MG tablet TAKE 1 TABLET EVERY 4 HOURS AS NEEDED FOR NAUSEA AND VOMITING   SALINE NASAL SPRAY NA Place 1 spray into the nose daily as needed (congestion).  No facility-administered encounter medications on file as of 12/17/2020.    Lab Results  Component Value Date/Time   HGBA1C 6.0 11/08/2020 08:26 AM   HGBA1C 6.0 07/19/2020 07:41 AM   MICROALBUR 1.1 04/29/2010 08:34 AM   MICROALBUR 0.6 04/23/2009 08:52 AM     BP Readings from Last 3 Encounters:  12/14/20 (!) 149/85  12/12/20 136/79  11/28/20 133/72   Have you seen any other providers since your last visit with PCP? Yes  Any changes in your medications or health? Yes the patient reports she had cataract surgery on both  eyes and now she has 20/20 vision  Any side effects from any medications? No  Do you have an symptoms or problems not managed by your medications? No  Any concerns about your health right now? Yes the patient reports she has concerns about her red blood cells  Has your provider asked that you check blood pressure, blood sugar, or follow special diet at home? Yes the patient reports she eats a bland diet and now that her vision has improved she will start keeping recordings of her BG's and BP's  Do you get any type of exercise on a regular basis? Yes walks for exercise when she can   Can you think of a goal you would like to reach for your health? No  Do you have any problems getting your medications? No  Is there anything that you would like to discuss during the appointment? No   Sunshine Mackowski was reminded to have all medications, supplements and any blood glucose and blood pressure readings available for review with [name], Pharm. D, at her telephone visit on 12/24/2020 at 11:00am.    Star Rating Drugs:  Medication:  Last Fill: Day Supply Glipizide 2.52m  11/14/20  90 Losartan 538m5/4/22  90 Metformin 100088m/4/22  90   Follow-Up:  Pharmacist Review  MicDebbora DusPP notified  VelNorth Beach Havensistant [phone] have reviewed the care management and care coordination activities outlined in this encounter and I am certifying that I agree with the content of this note. No further action required.  MicDebbora DusharmD Clinical Pharmacist [phone]

## 2020-12-21 ENCOUNTER — Encounter: Payer: Self-pay | Admitting: Oncology

## 2020-12-24 ENCOUNTER — Telehealth: Payer: Self-pay | Admitting: Family Medicine

## 2020-12-24 ENCOUNTER — Other Ambulatory Visit: Payer: Self-pay

## 2020-12-24 ENCOUNTER — Ambulatory Visit (INDEPENDENT_AMBULATORY_CARE_PROVIDER_SITE_OTHER): Payer: Medicare HMO

## 2020-12-24 DIAGNOSIS — E785 Hyperlipidemia, unspecified: Secondary | ICD-10-CM | POA: Diagnosis not present

## 2020-12-24 DIAGNOSIS — E1169 Type 2 diabetes mellitus with other specified complication: Secondary | ICD-10-CM | POA: Diagnosis not present

## 2020-12-24 DIAGNOSIS — I10 Essential (primary) hypertension: Secondary | ICD-10-CM | POA: Diagnosis not present

## 2020-12-24 DIAGNOSIS — E119 Type 2 diabetes mellitus without complications: Secondary | ICD-10-CM

## 2020-12-24 NOTE — Telephone Encounter (Signed)
Please let pt know that I reviewed the note from [facility] and advise to stop glipizide due to well controlled a1c and some low readings  We can consider adding zetia for cholesterol the next time we check it

## 2020-12-24 NOTE — Patient Instructions (Addendum)
December 24, 2020  Dear Martha Hamilton,  It was a pleasure meeting you during our initial appointment on December 24, 2020. Below is a summary of the goals we discussed and components of chronic care management. Please contact me anytime with questions or concerns.   Visit Information   Patient Care Plan: CCM Pharmacy Care Plan     Problem Identified: CHL AMB "PATIENT-SPECIFIC PROBLEM"      Long-Range Goal: [place]   Start Date: 12/24/2020  Priority: High  Note:     Current Barriers:  None identified   Pharmacist Clinical Goal(s):  Patient will contact provider office for questions/concerns as evidenced notation of same in electronic health record through collaboration with PharmD and provider.   Interventions: 1:1 collaboration with Tower, Wynelle Fanny, MD regarding development and update of comprehensive plan of care as evidenced by provider attestation and co-signature Inter-disciplinary care team collaboration (see longitudinal plan of care) Comprehensive medication review performed; medication list updated in electronic medical record  Hypertension (BP goal <140/90) -Controlled - home BP is within goal  -Current treatment: Losartan 50 mg daily -Medications previously tried: HCTZ- discontinued due to kidney function  -Current home readings: checks daily at home - reports BP is usually around 136/74 at home -Follows low salt diet, uses Ms. Dash for seasoning. Uses pepper instead of salt.  -Denies hypotensive/hypertensive symptoms -Educated on BP goals and benefits of medications for prevention of heart attack, stroke and kidney damage; -Counseled to monitor BP at home at least once monthly, document, and provide log at future appointments -Recommended to continue current medication  Hyperlipidemia: (LDL goal < 100) -Not ideally controlled - LDL 108 TG 497 (01/22) -She reports high TG run in her family -Current treatment: Fenofibrate 145 mg daily Aspirin 81 mg - 1  tablet daily in the evening  -Medications previously tried: Per PCP note, statin intolerant. Patient affirms she took statin for a while but pain (muscle cramps, etc.) became very severe. States she has tried 4 different kinds. Fibrate dose was increased 7/21 due to high TG. -Denies bleeding on aspirin. She uses Tylenol as needed, avoid NSAIDs.  -Educated on Cholesterol goals;  Patient states she would like to find a medication she could tolerate to get TG down. We discussed the potential to add Zetia  ; She will discuss with PCP if desired. At this time as TG < 500 and LDL only mildly elevated at 108, I would continue current therapy unless patient desires more strict control. -Recommended to continue current medication  Diabetes (A1c goal <7%) -Controlled - A1c 6% -Current medications: Glipizide 2.5 mg - 1 every evening  Metformin 1000 mg - 1/2 (500 mg) twice daily -Medications previously tried: metformin dose reduced due to kidney function  -Confirms adherence to above medications as prescribed -Current home glucose readings - checks daily (rotates between fasting and 2 hours after supper) fasting glucose: 103 (this morning), she reports it usually runs low 100s (lowest - 74, 80) post prandial glucose: 125-135 -Denies hypoglycemic/hyperglycemic symptoms -Current diet: 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  -[place] exercise[place], [place] -Educated on Counseling to keep track of low BG or symptoms and let us know; We might could stop glipizide given BG control. -Counseled to check feet daily and get yearly eye exams - up to date  -Recommended to continue current medication  GERD (Goal: Control acid reflux) -Controlled - per  patient report  -Followed by GI, Dr. Scarlette Shorts -Current treatment  Omeprazole 40 mg - 1 twice daily Tums - takes as needed for breakthrough symptoms  -Medications previously tried: none  reported -She cooks foods with minimal spice. She has to raise head of bed and limits food before bed.  -Recommended to continue current medication   OTC -  Vitamin D3 2000 IU daily Claritin 10 mg daily  Saline spray PRN Zofran PRN Salonpas for arthritis in left knee, this works well. No additional meds needed  Reports amitriptyline helps her relax and go to sleep. She sleeps very well and wakes up rested.  She reports some fatigue during the day but thinks related to the anemia. Does not seem to be groggy.  Patient Goals/Self-Care Activities Patient will:  - take medications as prescribed  Follow Up Plan: Telephone follow up appointment with care management team member scheduled for:  12 months     Martha Hamilton was given information about Chronic Care Management services today including:  CCM service includes personalized support from designated clinical staff supervised by her physician, including individualized plan of care and coordination with other care providers 24/7 contact phone numbers for assistance for urgent and routine care needs. Standard insurance, coinsurance, copays and deductibles apply for chronic care management only during months in which we provide at least 20 minutes of these services. Most insurances cover these services at 100%, however patients may be responsible for any copay, coinsurance and/or deductible if applicable. This service may help you avoid the need for more expensive face-to-face services. Only one practitioner may furnish and bill the service in a calendar month. The patient may stop CCM services at any time (effective at the end of the month) by phone call to the office staff.  Patient agreed to services and verbal consent obtained.   Patient verbalizes understanding of instructions provided today and agrees to view in [place].  Telephone follow up appointment with pharmacy team member scheduled for:12 months  [name], PharmD Clinical  Pharmacist [facility] at [facility] [phone]

## 2020-12-24 NOTE — Progress Notes (Signed)
Chronic Care Management Pharmacy Note  12/24/2020 Name:  Manika Hast MRN:  [id number] DOB:  05-10-1951  Summary: HTN and DM stable, well controlled. Occasional low fasting BG 70-80 range on glipizide, metformin. Hypertriglyceridemia with TG 497 on fenofibrate. Statin intolerant.   Recommendations/Changes made from today's visit: Consider stopping glipizide given A1c 6%, some BG in 70-80s in morning. Would continue to monitor cholesterol and consider add Zetia if LDL and TG remain elevated.  Plan: CCM follow up visit 12 months  Patient to discuss above recommendations with PCP if desired.    Subjective: Reed Eifert is an 70 y.o. year old female who is a primary patient of Tower, Wynelle Fanny, MD.  The CCM team was consulted for assistance with disease management and care coordination needs.    Engaged with patient by telephone for initial visit in response to provider referral for pharmacy case management and/or care coordination services. Patient reports biggest health concern is her anemia, which is followed by hematology. She is very happy with this care. She is receiving B12 injections and iron infusions. Reports her capacity is lower and easily fatigued. Less energetic.   Consent to Services:  The patient was given the following information about Chronic Care Management services today, agreed to services, and gave verbal consent: 1. CCM service includes personalized support from designated clinical staff supervised by the primary care provider, including individualized plan of care and coordination with other care providers 2. 24/7 contact phone numbers for assistance for urgent and routine care needs. 3. Service will only be billed when office clinical staff spend 20 minutes or more in a month to coordinate care. 4. Only one practitioner may furnish and bill the service in a calendar month. 5.The patient may stop CCM services at any time (effective at the end of the month) by phone  call to the office staff. 6. The patient will be responsible for cost sharing (co-pay) of up to 20% of the service fee (after annual deductible is met). Patient agreed to services and consent obtained.  Patient Care Team: Tower, Wynelle Fanny, MD as PCP - Claris Gower, [facility] as Pharmacist (Pharmacist)  Recent office visits:  10/10/20 - Dr.Tower PCP - URI, Started Augmentin course 08/08/20 - Dr.Tower PCP - Labs ordered. Kidney function improved. Continue current medicines and re-check bmp in 3 mo for renal insufficiency. Anemia and iron levels are worse. Refer to hematology. 07/24/20 - Dr.Tower PCP - Kidney function decline. Hold HCTZ. Cut metformin dose in 1/2 (take 500 bid). F/u in 2 weeks.   Recent consult visits:  12/14/20 - [facility] - Cough, started Benzonatate 100 mg - take 1 tablet 3 times a day 11/02/20 - Oncology  Labs ordered  Recommend follow up with Gastroenterology 09/28/20 - [facility]- B12 injection 09/21/20 - [facility]-B12 injection 09/11/20 - [facility]-B12 injection 09/06/20 - Oncology Labs ordered follow up 6 months 08/27/20 - Oncology Labs ordered no medication changes 07/23/20 - Orthopedics No data found   Hospital visits:  None in previous 6 months    Objective:  Lab Results  Component Value Date   CREATININE 1.20 11/08/2020   BUN 19 11/08/2020   GFR 46.10 (L) 11/08/2020   GFRNONAA 42 (L) 08/27/2020   GFRAA >60 02/02/2018   NA 139 11/08/2020   K 4.5 11/08/2020   CALCIUM 9.6 11/08/2020   CO2 27 11/08/2020   GLUCOSE 117 (H) 11/08/2020    Lab Results  Component Value Date/Time   HGBA1C 6.0 11/08/2020 08:26  AM   HGBA1C 6.0 07/19/2020 07:41 AM   GFR 46.10 (L) 11/08/2020 08:26 AM   GFR 44.83 (L) 08/08/2020 08:59 AM   MICROALBUR 1.1 04/29/2010 08:34 AM   MICROALBUR 0.6 04/23/2009 08:52 AM    Last diabetic Eye exam:  Lab Results  Component Value Date/Time   HMDIABEYEEXA No Retinopathy 05/09/2020 12:00 AM    Last diabetic Foot exam:  02/14/20 normal  Lab  Results  Component Value Date   CHOL 218 (H) 07/19/2020   HDL 36.60 (L) 07/19/2020   LDLCALC 51 10/21/2011   LDLDIRECT 108.0 07/19/2020   TRIG (H) 07/19/2020    497.0 Triglyceride is over 400; calculations on Lipids are invalid.   CHOLHDL 6 07/19/2020    Hepatic Function Latest Ref Rng & Units 11/08/2020 08/27/2020 07/19/2020  Total Protein 6.0 - 8.3 g/dL 7.6 7.9 7.5  Albumin 3.5 - 5.2 g/dL 4.1 3.8 4.2  AST 0 - 37 U/L _0 ALT 0 - 35 U/L _1 Alk Phosphatase 39 - 117 U/L 38(L) 35(L) 28(L)  Total Bilirubin 0.2 - 1.2 mg/dL 0.2 0.4 0.3  Bilirubin, Direct 0.0 - 0.3 mg/dL - - -    Lab Results  Component Value Date/Time   TSH 1.04 01/13/2020 08:03 AM   TSH 1.04 01/10/2019 11:25 AM    CBC Latest Ref Rng & Units 10/31/2020 08/27/2020 08/08/2020  WBC 4.0 - 10.5 K/uL 7.5 9.3 5.3  Hemoglobin 12.0 - 15.0 g/dL 10.8(L) 8.9(L) 9.2(L)  Hematocrit 36.0 - 46.0 % 34.3(L) 27.8(L) 27.7(L)  Platelets 150 - 400 K/uL 406(H) 666(H) 627.0(H)    No results found for: VD25OH  Clinical ASCVD: No  The 10-year ASCVD risk score Mikey Bussing DC Jr., et al., 2013) is: 30.3%   Values used to calculate the score:     Age: 70 years     Sex: Female     Is Non-Hispanic African American: No     Diabetic: Yes     Tobacco smoker: No     Systolic Blood Pressure: 875 mmHg     Is BP treated: Yes     HDL Cholesterol: 36.6 mg/dL     Total Cholesterol: 218 mg/dL    Depression screen [facility] 2/9 01/13/2020 01/10/2019 12/21/2017  Decreased Interest 0 0 0  Down, Depressed, Hopeless 0 0 0  PHQ - 2 Score 0 0 0  Altered sleeping 0 0 0  Tired, decreased energy 0 0 0  Change in appetite 0 0 0  Feeling bad or failure about yourself  0 0 -  Trouble concentrating 0 0 0  Moving slowly or fidgety/restless 0 0 0  Suicidal thoughts 0 0 0  PHQ-9 Score 0 0 0  Difficult doing work/chores Not difficult at all Not difficult at all -  Some recent data might be hidden    Social History   Tobacco Use  Smoking Status Never   Smokeless Tobacco Never   BP Readings from Last 3 Encounters:  12/14/20 (!) 149/85  12/12/20 136/79  11/28/20 133/72   Pulse Readings from Last 3 Encounters:  12/14/20 (!) 117  12/12/20 98  11/28/20 (!) 105   Wt Readings from Last 3 Encounters:  12/12/20 164 lb (74.4 kg)  11/28/20 164 lb 12.8 oz (74.8 kg)  11/02/20 164 lb (74.4 kg)   BMI Readings from Last 3 Encounters:  12/12/20 32.58 kg/m  11/28/20 32.73 kg/m  11/02/20 32.57 kg/m   DEXA - 04/2020 Bone density is within the normal range  Assessment/Interventions: Review of patient past medical history, allergies, medications, health status, including review of consultants reports, laboratory and other test data, was performed as part of comprehensive evaluation and provision of chronic care management services.   SDOH:  (Social Determinants of Health) assessments and interventions performed: Yes SDOH Interventions    Flowsheet Row Most Recent Value  SDOH Interventions   Financial Strain Interventions Intervention Not Indicated      SDOH Screenings   Alcohol Screen: Not on file  Depression (PHQ2-9): Low Risk    PHQ-2 Score: 0  Financial Resource Strain: Low Risk    Difficulty of Paying Living Expenses: Not very hard  Food Insecurity: Not on file  Housing: Not on file  Physical Activity: Not on file  Social Connections: Not on file  Stress: Not on file  Tobacco Use: Low Risk    Smoking Tobacco Use: Never   Smokeless Tobacco Use: Never  Transportation Needs: Not on file    CCM Care Plan  Allergies  Allergen Reactions   Cholestyramine Other (See Comments)    REACTION: Muscles tightened up, drew up.   Lipitor [Atorvastatin] Other (See Comments)    Muscle cramping   Niacin Nausea And Vomiting   Simvastatin     Muscular pain    Medications Reviewed Today     Reviewed by [name], [facility] (Pharmacist) on 12/24/20 at 1137  Med List Status: <None>   Medication Order Taking? Sig Documenting  Provider Last Dose Status Informant  Accu-Chek Softclix Lancets lancets [id number] Yes TEST BLOOD SUGAR EVERY DAY FOR DIABETES Tower, Wynelle Fanny, MD Taking Active   acetaminophen (TYLENOL) 500 MG tablet [id number] Yes Take 1,000 mg by mouth every 6 (six) hours as needed for mild pain.  [provider] Taking Active Self  Alcohol Swabs (B-D SINGLE USE SWABS REGULAR) PADS [id number] Yes USE ONE TIME DAILY AS DIRECTED WHEN CHECKING BLOOD SUGAR Tower, Wynelle Fanny, MD Taking Active   amitriptyline (ELAVIL) 25 MG tablet [id number] Yes Take 1 tablet (25 mg total) by mouth at bedtime. Tower, Wynelle Fanny, MD Taking Active   aspirin 81 MG tablet [id number] Yes Take 81 mg by mouth at bedtime.  [provider] Taking Active Self  benzonatate (TESSALON) 100 MG capsule [id number] Yes Take 1 capsule (100 mg total) by mouth 3 (three) times daily as needed for cough. Sharion Balloon, NP Taking Active   Blood Glucose Calibration (ACCU-CHEK AVIVA) Bailey Mech [id number] Yes USE TO CALIBRATE METER AS DIRECTED Tower, Wynelle Fanny, MD Taking Active   Blood Glucose Monitoring Suppl (ACCU-CHEK AVIVA PLUS) w/Device KIT [id number] Yes Use to check blood sugar once daily for DM (dx. E11.9) Tower, Wynelle Fanny, MD Taking Active   calcium elemental as carbonate (BARIATRIC TUMS ULTRA) 400 MG chewable tablet [id number] Yes Chew 1,000 mg by mouth daily as needed for heartburn. [provider] Taking Active   Camphor-Menthol-Methyl Sal (SALONPAS) 3.07-19-08 % [place] [id number] Yes Apply topically. [provider] Taking Active Self  Cholecalciferol (VITAMIN D) 2000 units tablet [id number] Yes Take 2,000 Units by mouth daily. [provider] Taking Active Self  fenofibrate (TRICOR) 145 MG tablet [id number] Yes TAKE 1 TABLET EVERY DAY Tower, Wynelle Fanny, MD Taking Active   glipiZIDE (GLUCOTROL XL) 2.5 MG 24 hr tablet [id number] Yes TAKE 1 TABLET EVERY EVENING Tower, Wynelle Fanny, MD Taking Active   glucose blood (ACCU-CHEK AVIVA PLUS) test strip  [id number] Yes TEST BLOOD SUGAR EVERY DAY FOR DIABETES (Dx. E11.9) Tower, Wynelle Fanny, MD Taking Active  loratadine (CLARITIN) 10 MG tablet [id number] Yes Take 10 mg by mouth daily as needed for allergies. [provider] Taking Active Self  losartan (COZAAR) 50 MG tablet [id number] Yes TAKE 1 TABLET EVERY DAY Tower, Wynelle Fanny, MD Taking Active   meclizine (ANTIVERT) 25 MG tablet [id number] Yes Take 1 tablet (25 mg total) by mouth 3 (three) times daily as needed for dizziness. Tower, Wynelle Fanny, MD Taking Active Self  metFORMIN (GLUCOPHAGE) 1000 MG tablet [id number] Yes Take 0.5 tablets (500 mg total) by mouth 2 (two) times daily with a meal. TAKE 1/2 (546m) TABLET TWICE DAILY WITH A MEAL Tower, MWynelle Fanny MD Taking Active   omeprazole (PRILOSEC) 40 MG capsule [id number] TAKE 1 CAPSULE IN THE MORNING AND AT BEDTIME. PIrene Shipper MD Taking Active   ondansetron ([facility] 4 MG tablet [id number] TAKE 1 TABLET EVERY 4 HOURS AS NEEDED FOR NAUSEA AND VOMITING Tower, MWynelle Fanny MD Taking Active   SALINE NASAL SPRAY NA [id number] Place 1 spray into the nose daily as needed (congestion).  [provider] Taking Active Self            Patient Active Problem List   Diagnosis Date Noted   Family history of cancer 08/27/2020   History of breast cancer 08/27/2020   IDA (iron deficiency anemia) 08/27/2020   B12 deficiency 08/27/2020   Nausea 07/24/2020   Decreased GFR 07/24/2020   Tachycardia 01/11/2019   Fatty liver 06/21/2018   Obesity (BMI 30-39.9) 06/21/2018   Dyspepsia 05/25/2018   GERD (gastroesophageal reflux disease) 05/25/2018   H/O vertigo 05/11/2018   Pre-op examination 01/22/2018   Thrombocytosis 12/25/2017   Tear of medial meniscus of knee 12/21/2017   Left knee pain 09/21/2017   Low back pain 07/06/2017   Welcome to Medicare preventive visit 12/17/2016   Grief reaction 12/17/2016   Estrogen deficiency 09/08/2016   Muscle pain 02/08/2016   Normocytic anemia 02/09/2013    Abnormal EKG 07/27/2012   Special screening for malignant neoplasms, colon 04/29/2011   Gynecological examination 04/29/2011   Routine general medical examination at a health care facility 04/20/2011   OSTEOARTHRITIS, HANDS, BILATERAL 10/31/2008   Diabetes type 2, controlled ([place] 07/10/2008   Hyperlipidemia associated with type 2 diabetes mellitus ([place] 01/26/2008   Essential hypertension 01/26/2008   ALLERGIC RHINITIS 12/07/2007    Immunization History  Administered Date(s) Administered   Fluad Quad(high Dose 65+) 03/10/2019, 03/29/2020   Influenza Split 04/09/2011, 04/01/2012   Influenza Whole 04/23/2007, 04/10/2008, 04/10/2009, 04/02/2010   Influenza,inj,Quad PF,6+ Mos 03/23/2013, 03/29/2014, 03/13/2015, 04/10/2016, 03/25/2017, 04/08/2018   PFIZER(Purple Top)SARS-COV-2 Vaccination 09/05/2019, 09/26/2019, 05/26/2020   Pneumococcal Conjugate-13 02/08/2016   Pneumococcal Polysaccharide-23 04/13/2008, 05/16/2013, 02/26/2017   Td 02/09/2009   Zoster, Live 01/28/2011    Conditions to be addressed/monitored:  Hypertension, Hyperlipidemia, Diabetes, and GERD  Care Plan : [place] Updates made by ADebbora Dus RWarsawsince 12/24/2020 12:00 AM     Problem: CHL AMB "PATIENT-SPECIFIC PROBLEM"      Long-Range Goal: [place]  Start Date: 12/24/2020  Priority: High  Note:     Current Barriers:  None identified   Pharmacist Clinical Goal(s):  Patient will contact provider office for questions/concerns as evidenced notation of same in electronic health record through collaboration with PharmD and provider.   Interventions: 1:1 collaboration with Tower, MWynelle Fanny MD regarding development and update of comprehensive plan of care as evidenced by provider attestation and co-signature Inter-disciplinary care team collaboration (see  longitudinal plan of care) Comprehensive medication review performed; medication list updated in electronic medical  record  Hypertension (BP goal <140/90) -Controlled - home BP is within goal  -Current treatment: Losartan 50 mg daily -Medications previously tried: HCTZ- discontinued due to kidney function  -Current home readings: checks daily at home - reports BP is usually around 136/74 at home -Follows low salt diet, uses Ms. Dash for seasoning. Uses pepper instead of salt.  -Denies hypotensive/hypertensive symptoms -Educated on BP goals and benefits of medications for prevention of heart attack, stroke and kidney damage; -Counseled to monitor BP at home at least once monthly, document, and provide log at future appointments -Recommended to continue current medication  Hyperlipidemia: (LDL goal < 100) -Not ideally controlled - LDL 108 TG 497 (01/22) -She reports high TG run in her family -Current treatment: Fenofibrate 145 mg daily Aspirin 81 mg - 1 tablet daily in the evening  -Medications previously tried: Per PCP note, statin intolerant. Patient affirms she took statin for a while but pain (muscle cramps, etc.) became very severe. States she has tried 4 different kinds. Fibrate dose was increased 7/21 due to high TG. -Denies bleeding on aspirin. She uses Tylenol as needed, avoid NSAIDs.  -Educated on Cholesterol goals;  Patient states she would like to find a medication she could tolerate to get TG down. We discussed the potential to add Zetia  ; She will discuss with PCP if desired. At this time as TG < 500 and LDL only mildly elevated at 108, I would continue current therapy unless patient desires more strict control. -Recommended to continue current medication  Diabetes (A1c goal <7%) -Controlled - A1c 6% -Current medications: Glipizide 2.5 mg - 1 every evening  Metformin 1000 mg - 1/2 (500 mg) twice daily -Medications previously tried: metformin dose reduced due to kidney function  -Confirms adherence to above medications as prescribed -Current home glucose readings - checks daily  (rotates between fasting and 2 hours after supper) fasting glucose: 103 (this morning), she reports it usually runs low 100s (lowest - 74, 80) post prandial glucose: 125-135 -Denies hypoglycemic/hyperglycemic symptoms -Current diet: 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  -[place] exercise[place], [place] -Educated on Counseling to keep track of low BG or symptoms and let us know; We might could stop glipizide given BG control. -Counseled to check feet daily and get yearly eye exams - up to date  -Recommended to continue current medication  GERD (Goal: Control acid reflux) -Controlled - per patient report  -Followed by GI, Dr. Scarlette Shorts -Current treatment  Omeprazole 40 mg - 1 twice daily Tums - takes as needed for breakthrough symptoms  -Medications previously tried: none reported -She cooks foods with minimal spice. She has to raise head of bed and limits food before bed.  -Recommended to continue current medication   OTC -  Vitamin D3 2000 IU daily Claritin 10 mg daily  Saline spray PRN Zofran PRN Salonpas for arthritis in left knee, this works well. No additional meds needed  Reports amitriptyline helps her relax and go to sleep. She sleeps very well and wakes up rested.  She reports some fatigue during the day but thinks related to the anemia. Does not seem to be groggy.  Patient Goals/Self-Care Activities Patient will:  - take medications as prescribed  Follow Up Plan: Telephone follow up appointment with care management team member scheduled for:  12  months     Medication Assistance: None required.  Patient affirms current coverage meets needs.  Compliance/Adherence/Medication fill history: Care Gaps: Shingrix (not covered by patient's insurance per pt report) COVID-19 Booster  Hepatitis C Screening  Colon Cancer Screening FOBT - annual  Star-Rating Drugs: Medication:                Last  Fill:         Day Supply Glipizide 2.108m           11/14/20              90 Losartan 521m           11/14/20              90 Metformin 100073m    11/14/20              90  Patient's preferred pharmacy is:  [address] [place]one: [phone]: [phone] [place]one: [phone]: [phone] uses mail order for maintenance meds and [facility] for short term. Uses pill box? Yes - uses weekly pillbox, with 4 timeslots  Pt endorses  100% compliance  We discussed: Benefits of medication synchronization, packaging and delivery as well as enhanced pharmacist oversight with [employer]. Patient decided to: Continue current medication management strategy  Care Plan and Follow Up Patient Decision:  Patient agrees to Care Plan and Follow-up.   MicDebbora DusharmD Clinical Pharmacist [phone]

## 2020-12-25 ENCOUNTER — Encounter: Payer: Self-pay | Admitting: Podiatry

## 2020-12-25 ENCOUNTER — Other Ambulatory Visit: Payer: Self-pay

## 2020-12-25 ENCOUNTER — Ambulatory Visit: Payer: Medicare HMO | Admitting: Podiatry

## 2020-12-25 ENCOUNTER — Ambulatory Visit (INDEPENDENT_AMBULATORY_CARE_PROVIDER_SITE_OTHER): Payer: Medicare HMO

## 2020-12-25 DIAGNOSIS — M778 Other enthesopathies, not elsewhere classified: Secondary | ICD-10-CM

## 2020-12-25 DIAGNOSIS — S99922A Unspecified injury of left foot, initial encounter: Secondary | ICD-10-CM | POA: Diagnosis not present

## 2020-12-25 MED ORDER — MELOXICAM 15 MG PO TABS: 15.0000 mg | ORAL_TABLET | Freq: Every day | ORAL | 1 refills | Status: DC

## 2020-12-25 MED ORDER — BETAMETHASONE SOD PHOS & ACET 6 (3-3) MG/ML IJ SUSP
3.0000 mg | Freq: Once | INTRAMUSCULAR | Status: AC
  Administered 2020-12-25: 3 mg via INTRA_ARTICULAR

## 2020-12-25 NOTE — Telephone Encounter (Signed)
Patient advised and verbalized undersanding

## 2020-12-25 NOTE — Progress Notes (Addendum)
   HPI: 70 y.o. female presenting today as an established patient for new complaint regarding left foot pain.  Patient states that about 2 weeks ago she twisted her left foot on some uneven ground.  Initially she thought that she only sprained it however the pain has continued.  It is very painful to the lateral aspect of the foot.  She does have some swelling to the area as well.  She has been soaking her foot in Epsom salt and applying ice which does help alleviate some of her pain.  She presents for further treatment and evaluation  Past Medical History:  Diagnosis Date   Allergic rhinitis    Breast cancer ([place]) 1996   right breast   DM2 (diabetes mellitus, type 2) ([place])    GERD (gastroesophageal reflux disease)    Hx of cardiovascular stress test    a. Lex MV 2/14:  EF 69%, no ischemia   Hyperlipidemia    Hypertension    IDA (iron deficiency anemia) 08/27/2020   Iron deficiency anemia    Obesity    Osteoarthritis    hands   Ulcer      Physical Exam: General: The patient is alert and oriented x3 in no acute distress.  Dermatology: Skin is warm, dry and supple bilateral lower extremities. Negative for open lesions or macerations.  Vascular: Palpable pedal pulses bilaterally. No edema or erythema noted. Capillary refill within normal limits.  Neurological: Epicritic and protective threshold grossly intact bilaterally.   Musculoskeletal Exam: No pedal deformities noted.  Range of motion normal to all pedal and ankle joints bilateral. Pain on palpation to the lateral aspect of the left foot towards the base of the fourth metatarsal  Radiographic Exam:  Normal osseous mineralization. Joint spaces preserved. No fracture/dislocation/boney destruction.    Assessment: 1.  Left foot sprain/capsulitis   Plan of Care:  1. Patient evaluated. X-Rays reviewed.  2.  Injection of 0.5 cc Celestone Soluspan injected into the lateral aspect of the left foot 3.  Patient states that she has a  cam boot at home.  Wear daily.  Weightbearing as tolerated x3 weeks 4.  Prescription for meloxicam 15 mg 5.  Return to clinic in 4 weeks      Edrick Kins, DPM [facility]  Dr. Edrick Kins, DPM    [address], [place] [address]                Office [phone]  Fax [phone]

## 2020-12-26 ENCOUNTER — Other Ambulatory Visit: Payer: Self-pay | Admitting: Podiatry

## 2020-12-26 DIAGNOSIS — M778 Other enthesopathies, not elsewhere classified: Secondary | ICD-10-CM

## 2021-01-07 ENCOUNTER — Inpatient Hospital Stay: Payer: Medicare HMO | Attending: Oncology

## 2021-01-07 DIAGNOSIS — E538 Deficiency of other specified B group vitamins: Secondary | ICD-10-CM | POA: Diagnosis not present

## 2021-01-07 DIAGNOSIS — D508 Other iron deficiency anemias: Secondary | ICD-10-CM

## 2021-01-07 MED ORDER — CYANOCOBALAMIN 1000 MCG/ML IJ SOLN
1000.0000 ug | Freq: Once | INTRAMUSCULAR | Status: AC
  Administered 2021-01-07: 1000 ug via INTRAMUSCULAR
  Filled 2021-01-07: qty 1

## 2021-01-15 ENCOUNTER — Telehealth: Payer: Self-pay

## 2021-01-15 ENCOUNTER — Ambulatory Visit: Payer: Medicare PPO

## 2021-01-15 NOTE — Telephone Encounter (Signed)
Patient called and confirmed she is taking her Glipizide. Patient stated that she is not at this time requesting a refill. Patient stated her appreciation for the call.

## 2021-01-15 NOTE — Telephone Encounter (Signed)
Patient states that she Glipizide 12/25/2020. Her blood sugar readings at home have been getting high around 150's for last few days. She wanted to let you know that she started back on glipizide 01/13/2021. She is taking one tab at dinner time.

## 2021-01-15 NOTE — Telephone Encounter (Signed)
Thanks for the heads up -please refill if needed

## 2021-01-17 ENCOUNTER — Ambulatory Visit (INDEPENDENT_AMBULATORY_CARE_PROVIDER_SITE_OTHER): Payer: Medicare HMO

## 2021-01-17 DIAGNOSIS — Z0000 Encounter for general adult medical examination without abnormal findings: Secondary | ICD-10-CM | POA: Diagnosis not present

## 2021-01-17 NOTE — Patient Instructions (Signed)
[name] , Thank you for taking time to come for your Medicare Wellness Visit. I appreciate your ongoing commitment to your health goals. Please review the following plan we discussed and let me know if I can assist you in the future.   Screening recommendations/referrals: Colonoscopy: Up to date, completed 05/06/2013, due 04/2023 Mammogram: Up to date, completed 05/08/2020, due 04/2021 Bone Density: Up to date, completed 05/08/2020, due 2-5 years  Recommended yearly ophthalmology/optometry visit for glaucoma screening and checkup Recommended yearly dental visit for hygiene and checkup  Vaccinations: Influenza vaccine: Up to date, completed 03/29/2020, due 02/2021 Pneumococcal vaccine: Completed series Tdap vaccine: decline-insurance  Shingles vaccine: due, check with your insurance regarding coverage if interested    Covid-19:completed 3 vaccines  Advanced directives: copy in chart  Conditions/risks identified: diabetes, hypertension, hyperlipidemia   Next appointment: Follow up in one year for your annual wellness visit    Preventive Care 26 Years and Older, Female Preventive care refers to lifestyle choices and visits with your health care provider that can promote health and wellness. What does preventive care include? A yearly physical exam. This is also called an annual well check. Dental exams once or twice a year. Routine eye exams. Ask your health care provider how often you should have your eyes checked. Personal lifestyle choices, including: Daily care of your teeth and gums. Regular physical activity. Eating a healthy diet. Avoiding tobacco and drug use. Limiting alcohol use. Practicing safe sex. Taking low-dose aspirin every day. Taking vitamin and mineral supplements as recommended by your health care provider. What happens during an annual well check? The services and screenings done by your health care provider during your annual well check will depend on your  age, overall health, lifestyle risk factors, and family history of disease. Counseling  Your health care provider may ask you questions about your: Alcohol use. Tobacco use. Drug use. Emotional well-being. Home and relationship well-being. Sexual activity. Eating habits. History of falls. Memory and ability to understand (cognition). Work and work Statistician. Reproductive health. Screening  You may have the following tests or measurements: Height, weight, and BMI. Blood pressure. Lipid and cholesterol levels. These may be checked every 5 years, or more frequently if you are over 7 years old. Skin check. Lung cancer screening. You may have this screening every year starting at age 44 if you have a 30-pack-year history of smoking and currently smoke or have quit within the past 15 years. Fecal occult blood test (FOBT) of the stool. You may have this test every year starting at age 5. Flexible sigmoidoscopy or colonoscopy. You may have a sigmoidoscopy every 5 years or a colonoscopy every 10 years starting at age 83. Hepatitis C blood test. Hepatitis B blood test. Sexually transmitted disease (STD) testing. Diabetes screening. This is done by checking your blood sugar (glucose) after you have not eaten for a while (fasting). You may have this done every 1-3 years. Bone density scan. This is done to screen for osteoporosis. You may have this done starting at age 27. Mammogram. This may be done every 1-2 years. Talk to your health care provider about how often you should have regular mammograms. Talk with your health care provider about your test results, treatment options, and if necessary, the need for more tests. Vaccines  Your health care provider may recommend certain vaccines, such as: Influenza vaccine. This is recommended every year. Tetanus, diphtheria, and acellular pertussis (Tdap, Td) vaccine. You may need a Td booster every 10 years.  Zoster vaccine. You may need this after  age 19. Pneumococcal 13-valent conjugate (PCV13) vaccine. One dose is recommended after age 32. Pneumococcal polysaccharide (PPSV23) vaccine. One dose is recommended after age 29. Talk to your health care provider about which screenings and vaccines you need and how often you need them. This information is not intended to replace advice given to you by your health care provider. Make sure you discuss any questions you have with your health care provider. Document Released: 07/27/2015 Document Revised: 03/19/2016 Document Reviewed: 05/01/2015 Elsevier Interactive Patient Education  2017 [employer] Prevention in the Home Falls can cause injuries. They can happen to people of all ages. There are many things you can do to make your home safe and to help prevent falls. What can I do on the outside of my home? Regularly fix the edges of walkways and driveways and fix any cracks. Remove anything that might make you trip as you walk through a door, such as a raised step or threshold. Trim any bushes or trees on the path to your home. Use bright outdoor lighting. Clear any walking paths of anything that might make someone trip, such as rocks or tools. Regularly check to see if handrails are loose or broken. Make sure that both sides of any steps have handrails. Any raised decks and porches should have guardrails on the edges. Have any leaves, snow, or ice cleared regularly. Use sand or salt on walking paths during winter. Clean up any spills in your garage right away. This includes oil or grease spills. What can I do in the bathroom? Use night lights. Install grab bars by the toilet and in the tub and shower. Do not use towel bars as grab bars. Use non-skid mats or decals in the tub or shower. If you need to sit down in the shower, use a plastic, non-slip stool. Keep the floor dry. Clean up any water that spills on the floor as soon as it happens. Remove soap buildup in the tub or shower  regularly. Attach bath mats securely with double-sided non-slip rug tape. Do not have throw rugs and other things on the floor that can make you trip. What can I do in the bedroom? Use night lights. Make sure that you have a light by your bed that is easy to reach. Do not use any sheets or blankets that are too big for your bed. They should not hang down onto the floor. Have a firm chair that has side arms. You can use this for support while you get dressed. Do not have throw rugs and other things on the floor that can make you trip. What can I do in the kitchen? Clean up any spills right away. Avoid walking on wet floors. Keep items that you use a lot in easy-to-reach places. If you need to reach something above you, use a strong step stool that has a grab bar. Keep electrical cords out of the way. Do not use floor polish or wax that makes floors slippery. If you must use wax, use non-skid floor wax. Do not have throw rugs and other things on the floor that can make you trip. What can I do with my stairs? Do not leave any items on the stairs. Make sure that there are handrails on both sides of the stairs and use them. Fix handrails that are broken or loose. Make sure that handrails are as long as the stairways. Check any carpeting to make sure that  it is firmly attached to the stairs. Fix any carpet that is loose or worn. Avoid having throw rugs at the top or bottom of the stairs. If you do have throw rugs, attach them to the floor with carpet tape. Make sure that you have a light switch at the top of the stairs and the bottom of the stairs. If you do not have them, ask someone to add them for you. What else can I do to help prevent falls? Wear shoes that: Do not have high heels. Have rubber bottoms. Are comfortable and fit you well. Are closed at the toe. Do not wear sandals. If you use a stepladder: Make sure that it is fully opened. Do not climb a closed stepladder. Make sure that  both sides of the stepladder are locked into place. Ask someone to hold it for you, if possible. Clearly mark and make sure that you can see: Any grab bars or handrails. First and last steps. Where the edge of each step is. Use tools that help you move around (mobility aids) if they are needed. These include: Canes. Walkers. Scooters. Crutches. Turn on the lights when you go into a dark area. Replace any light bulbs as soon as they burn out. Set up your furniture so you have a clear path. Avoid moving your furniture around. If any of your floors are uneven, fix them. If there are any pets around you, be aware of where they are. Review your medicines with your doctor. Some medicines can make you feel dizzy. This can increase your chance of falling. Ask your doctor what other things that you can do to help prevent falls. This information is not intended to replace advice given to you by your health care provider. Make sure you discuss any questions you have with your health care provider. Document Released: 04/26/2009 Document Revised: 12/06/2015 Document Reviewed: 08/04/2014 Elsevier Interactive Patient Education  2017 [employer].

## 2021-01-17 NOTE — Progress Notes (Signed)
Subjective:   Martha Hamilton is a 70 y.o. female who presents for Medicare Annual (Subsequent) preventive examination.  Review of Systems: N/A     I connected with the patient today by telephone and verified that I am speaking with the correct person using two identifiers. Location patient: home Location nurse: work Persons participating in the telephone visit: patient, nurse.   I discussed the limitations, risks, security and privacy concerns of performing an evaluation and management service by telephone and the availability of in person appointments. I also discussed with the patient that there may be a patient responsible charge related to this service. The patient expressed understanding and verbally consented to this telephonic visit.        Cardiac Risk Factors include: advanced age (>35mn, >>19women);diabetes mellitus;hypertension;Other (see comment), Risk factor comments: hyperlipidemia     Objective:    Today's Vitals   There is no height or weight on file to calculate BMI.  Advanced Directives 01/17/2021 12/12/2020 11/02/2020 08/27/2020 01/13/2020 01/10/2019 02/10/2018  Does Patient Have a Medical Advance Directive? _0  Yes -  Type of AParamedicof AC[place]Living will [place] will [place]Living will Living will;Healthcare Power of [place] will Living will -  Does patient want to make changes to medical advance directive? - No - Patient declined Yes (ED - Information included in AVS) - - - No - Patient declined  Copy of HT[place]arin Chart? Yes - validated most recent copy scanned in chart (See row information) No - copy requested - No - copy requested Yes - validated most recent copy scanned in chart (See row information) - -    Current Medications (verified) Outpatient Encounter Medications as of 01/17/2021  Medication Sig    Accu-Chek Softclix Lancets lancets TEST BLOOD SUGAR EVERY DAY FOR DIABETES   acetaminophen (TYLENOL) 500 MG tablet Take 1,000 mg by mouth every 6 (six) hours as needed for mild pain.    Alcohol Swabs (B-D SINGLE USE SWABS REGULAR) PADS USE ONE TIME DAILY AS DIRECTED WHEN CHECKING BLOOD SUGAR   amitriptyline (ELAVIL) 25 MG tablet Take 1 tablet (25 mg total) by mouth at bedtime.   aspirin 81 MG tablet Take 81 mg by mouth at bedtime.    benzonatate (TESSALON) 100 MG capsule Take 1 capsule (100 mg total) by mouth 3 (three) times daily as needed for cough.   Blood Glucose Calibration (ACCU-CHEK AVIVA) SOLN USE TO CALIBRATE METER AS DIRECTED   Blood Glucose Monitoring Suppl (ACCU-CHEK AVIVA PLUS) w/Device KIT Use to check blood sugar once daily for DM (dx. E11.9)   calcium elemental as carbonate (BARIATRIC TUMS ULTRA) 400 MG chewable tablet Chew 1,000 mg by mouth daily as needed for heartburn.   Camphor-Menthol-Methyl Sal (SALONPAS) 3.07-19-08 % PTCH Apply topically.   Cholecalciferol (VITAMIN D) 2000 units tablet Take 2,000 Units by mouth daily.   fenofibrate (TRICOR) 145 MG tablet TAKE 1 TABLET EVERY DAY   glipiZIDE (GLUCOTROL XL) 2.5 MG 24 hr tablet TAKE 1 TABLET EVERY EVENING   glucose blood (ACCU-CHEK AVIVA PLUS) test strip TEST BLOOD SUGAR EVERY DAY FOR DIABETES (Dx. E11.9)   hydrochlorothiazide (HYDRODIURIL) 25 MG tablet Take 1 tablet by mouth daily.   loratadine (CLARITIN) 10 MG tablet Take 10 mg by mouth daily as needed for allergies.   losartan (COZAAR) 50 MG tablet TAKE 1 TABLET EVERY DAY   meclizine (ANTIVERT) 25 MG tablet  Take 1 tablet (25 mg total) by mouth 3 (three) times daily as needed for dizziness.   meloxicam (MOBIC) 15 MG tablet Take 1 tablet (15 mg total) by mouth daily.   metFORMIN (GLUCOPHAGE) 1000 MG tablet Take 0.5 tablets (500 mg total) by mouth 2 (two) times daily with a meal. TAKE 1/2 (539m) TABLET TWICE DAILY WITH A MEAL   omeprazole (PRILOSEC) 40 MG capsule TAKE 1 CAPSULE  IN THE MORNING AND AT BEDTIME.   ondansetron (ZOFRAN) 4 MG tablet TAKE 1 TABLET EVERY 4 HOURS AS NEEDED FOR NAUSEA AND VOMITING   SALINE NASAL SPRAY NA Place 1 spray into the nose daily as needed (congestion).    traMADol (ULTRAM) 50 MG tablet Take 50 mg by mouth every 4 (four) hours.   No facility-administered encounter medications on file as of 01/17/2021.    Allergies (verified) Cholestyramine, Lipitor [atorvastatin], Niacin, and Simvastatin   History: Past Medical History:  Diagnosis Date   Allergic rhinitis    [place]ast [place]   right breast   DM2 (diabetes mellitus, type 2) (HCC)    GERD (gastroesophageal reflux disease)    Hx of cardiovascular stress test    a. Lex MV 2/14:  EF 69%, no ischemia   Hyperlipidemia    Hypertension    IDA (iron deficiency anemia) 08/27/2020   Iron deficiency anemia    Obesity    Osteoarthritis    hands   Ulcer    Past Surgical History:  Procedure Laterality Date   APPENDECTOMY  1976   BREAST LUMPECTOMY Right 1996   right breast, lumpectomy w/nodes   CATARACT EXTRACTION W/PHACO Left 11/28/2020   Procedure: CATARACT EXTRACTION PHACO AND INTRAOCULAR LENS PLACEMENT ([place] LEFT DIABETIC;  Surgeon: BLeandrew Koyanagi MD;  [facility] [facility]  Service: Ophthalmology;  Laterality: Left;  5.02 01:07.4 7.4%   CATARACT EXTRACTION W/PHACO Right 12/12/2020   Procedure: CATARACT EXTRACTION PHACO AND INTRAOCULAR LENS PLACEMENT ([facility]) RIGHT DIABETIC 5.22 00:59.2;  Surgeon: BLeandrew Koyanagi MD;  [place] [facility]  Service: Ophthalmology;  Laterality: Right;   CHONDROPLASTY Left 02/10/2018   Procedure: CHONDROPLASTY;  Surgeon: BLovell Sheehan MD;  Location: [facility];  Service: Orthopedics;  Laterality: Left;   GASTRIC RESTRICTION SURGERY     for reflux   HYSTEROSCOPY WITH D & C N/A 10/18/2012   Procedure: DILATATION AND CURETTAGE /HYSTEROSCOPY;  Surgeon: PMora Bellman MD;  [facility];  Service: Gynecology;   Laterality: N/A;   KNEE ARTHROSCOPY WITH MEDIAL MENISECTOMY Left 02/10/2018   Procedure: KNEE ARTHROSCOPY WITH MEDIAL and LATERAL MENISECTOMY;  Surgeon: BLovell Sheehan MD;  Location: [facility];  Service: Orthopedics;  Laterality: Left;   POLYPECTOMY N/A 10/18/2012   Procedure: POLYPECTOMY;  Surgeon: PMora Bellman MD;  [facility];  Service: Gynecology;  Laterality: N/A;   SYNOVECTOMY Left 02/10/2018   Procedure: SYNOVECTOMY;  Surgeon: BLovell Sheehan MD;  Location: [facility];  Service: Orthopedics;  Laterality: Left;   Family History  Problem Relation Age of Onset   Diabetes Mother    Heart disease Mother        Pacemaker, CHF   Hypertension Mother    Kidney cancer Mother    Stroke Father    CAD Father 646      Died with MI   Esophageal cancer Brother    Heart disease Sister    Colon cancer Neg Hx    Rectal cancer Neg Hx    Stomach cancer Neg Hx    Pancreatic  cancer Neg Hx    Social History   Socioeconomic History   Marital status: Single    Spouse name: Not on file   Number of children: 0   Years of education: Not on file   Highest education level: Not on file  Occupational History   Occupation: retired    Fish farm manager: RETIRED  Tobacco Use   Smoking status: Never   Smokeless tobacco: Never  Vaping Use   Vaping Use: Never used  Substance and Sexual Activity   Alcohol use: Never    Alcohol/week: 0.0 standard drinks   Drug use: No   Sexual activity: Not Currently    Birth control/protection: Post-menopausal  Other Topics Concern   Not on file  Social History Narrative   Lives with, and cares for her elderly mother.   Social Determinants of Health   Financial Resource Strain: Low Risk    Difficulty of Paying Living Expenses: Not hard at all  Food Insecurity: No Food Insecurity   Worried About Charity fundraiser in the Last Year: Never true   [place] in the Last Year: Never true  Transportation Needs: No Transportation Needs   Lack of Transportation  (Medical): No   Lack of Transportation (Non-Medical): No  Physical Activity: Insufficiently Active   Days of Exercise per Week: 7 days   Minutes of Exercise per Session: 20 min  Stress: No Stress Concern Present   Feeling of Stress : Not at all  Social Connections: Not on file    Tobacco Counseling Counseling given: Not Answered   Clinical Intake:  Pre-visit preparation completed: Yes  Pain : No/denies pain     Nutritional Risks: None Diabetes: Yes CBG done?: No Did pt. bring in CBG monitor from home?: No  How often do you need to have someone help you when you read instructions, pamphlets, or other written materials from your doctor or pharmacy?: 1 - Never  Diabetic: Yes Nutrition Risk Assessment:  Has the patient had any N/V/D within the last 2 months?  No  Does the patient have any non-healing wounds?  No  Has the patient had any unintentional weight loss or weight gain?  No   Diabetes:  Is the patient diabetic?  Yes  If diabetic, was a CBG obtained today?  No  telephone visit  Did the patient bring in their glucometer from home?  No  telephone visit  How often do you monitor your CBG's? daily.   Financial Strains and Diabetes Management:  Are you having any financial strains with the device, your supplies or your medication? No .  Does the patient want to be seen by Chronic Care Management for management of their diabetes?  No  Would the patient like to be referred to a Nutritionist or for Diabetic Management?  No   Diabetic Exams:  Diabetic Eye Exam: Completed 05/09/2020 Diabetic Foot Exam: Completed 07/24/2020   Interpreter Needed?: No  Information entered by :: CJohnson, LPN   Activities of Daily Living In your present state of health, do you have any difficulty performing the following activities: 01/17/2021 12/12/2020  Hearing? N N  Vision? N N  Difficulty concentrating or making decisions? N N  Walking or climbing stairs? N N  Dressing or  bathing? N N  Doing errands, shopping? N -  Preparing Food and eating ? N -  Using the Toilet? N -  In the past six months, have you accidently leaked urine? N -  Do you have  problems with loss of bowel control? N -  Managing your Medications? N -  Managing your Finances? N -  Housekeeping or managing your Housekeeping? N -  Some recent data might be hidden    Patient Care Team: [place], Wynelle Fanny, MD as PCP - General [name], [facility] as Pharmacist (Pharmacist)  Indicate any recent Medical Services you may have received from other than [facility] providers in the past year (date may be approximate).     Assessment:   This is a routine wellness examination for [facility].  Hearing/Vision screen Vision Screening - Comments:: Patient get annual eye exams   Dietary issues and exercise activities discussed: Current Exercise Habits: Home exercise routine, Type of exercise: walking, Time (Minutes): 20, Frequency (Times/Week): 7, Weekly Exercise (Minutes/Week): 140, Intensity: Moderate, Exercise limited by: None identified   Goals Addressed             This Visit's Progress    Patient Stated       01/17/2021, I continue to walk daily for about 15-20 minutes.         Depression Screen PHQ 2/9 Scores 01/17/2021 01/13/2020 01/10/2019 12/21/2017 03/01/2014 02/10/2013  PHQ - 2 Score 0 0 0 0 0 0  PHQ- 9 Score 0 0 0 0 - -    Fall Risk Fall Risk  01/17/2021 01/13/2020 01/10/2019 12/21/2017 12/17/2016  Falls in the past year? 0 0 0 No No  Number falls in past yr: 0 0 - - -  Injury with Fall? 0 0 - - -  Risk for fall due to : Medication side effect Medication side effect - - -  Follow up Falls evaluation completed;Falls prevention discussed Falls evaluation completed;Falls prevention discussed - - -    FALL RISK PREVENTION PERTAINING TO THE HOME:  Any stairs in or around the home? Yes  If so, are there any without handrails? No  Home free of loose throw rugs in walkways, pet beds, electrical cords, etc?  Yes  Adequate lighting in your home to reduce risk of falls? Yes   ASSISTIVE DEVICES UTILIZED TO PREVENT FALLS:  Life alert? No  Use of a cane, walker or w/c? No  Grab bars in the bathroom? No  Shower chair or bench in shower? No  Elevated toilet seat or a handicapped toilet? No   TIMED UP AND GO:  Was the test performed?  N/A telephone visit .    Cognitive Function: MMSE - Mini Mental State Exam 01/17/2021 01/13/2020 01/10/2019 12/21/2017  Orientation to time _0 Orientation to Place _1 Registration _2 Attention/ Calculation 5 5 0 0  Recall _3 Language- name 2 objects - - 0 0  Language- repeat _4 Language- follow 3 step command - - 0 3  Language- read & follow direction - - 0 0  Write a sentence - - 0 0  Copy design - - 0 0  Total score - - 17 20  Mini Cog  Mini-Cog screen was completed. Maximum score is 22. A value of 0 denotes this part of the MMSE was not completed or the patient failed this part of the Mini-Cog screening.       Immunizations Immunization History  Administered Date(s) Administered   Fluad Quad(high Dose 65+) 03/10/2019, 03/29/2020   Influenza Split 04/09/2011, 04/01/2012   Influenza Whole 04/23/2007, 04/10/2008, 04/10/2009, 04/02/2010   Influenza,inj,Quad PF,6+ Mos 03/23/2013, 03/29/2014, 03/13/2015, 04/10/2016,  03/25/2017, 04/08/2018   PFIZER(Purple Top)SARS-COV-2 Vaccination 09/05/2019, 09/26/2019, 05/26/2020   Pneumococcal Conjugate-13 02/08/2016   Pneumococcal Polysaccharide-23 04/13/2008, 05/16/2013, 02/26/2017   Td 02/09/2009   Zoster, Live 01/28/2011    TDAP status: Due, Education has been provided regarding the importance of this vaccine. Advised may receive this vaccine at local pharmacy or Health Dept. Aware to provide a copy of the vaccination record if obtained from local pharmacy or Health Dept. Verbalized acceptance and understanding.  Flu Vaccine status: Up to date  Pneumococcal vaccine status: Up to  date  Covid-19 vaccine status: Completed 3 vaccines  Qualifies for Shingles Vaccine? Yes   Zostavax completed Yes   Shingrix Completed?: No.    Education has been provided regarding the importance of this vaccine. Patient has been advised to call insurance company to determine out of pocket expense if they have not yet received this vaccine. Advised may also receive vaccine at local pharmacy or Health Dept. Verbalized acceptance and understanding.  Screening Tests Health Maintenance  Topic Date Due   Hepatitis C Screening  Never done   Zoster Vaccines- Shingrix (1 of 2) Never done   COLON CANCER SCREENING ANNUAL FOBT  05/06/2013   COVID-19 Vaccine (4 - Booster for Pfizer series) 08/26/2020   TETANUS/TDAP  04/12/2029 (Originally 02/10/2019)   INFLUENZA VACCINE  02/11/2021   MAMMOGRAM  05/08/2021   OPHTHALMOLOGY EXAM  05/09/2021   HEMOGLOBIN A1C  05/10/2021   FOOT EXAM  07/24/2021   COLONOSCOPY (Pts 45-99yr Insurance coverage will need to be confirmed)  05/07/2023   DEXA SCAN  Completed   PNA vac Low Risk Adult  Completed   HPV VACCINES  Aged Out    Health Maintenance  Health Maintenance Due  Topic Date Due   Hepatitis C Screening  Never done   Zoster Vaccines- Shingrix (1 of 2) Never done   COLON CANCER SCREENING ANNUAL FOBT  05/06/2013   COVID-19 Vaccine (4 - Booster for [place]) 08/26/2020    Colorectal cancer screening: Type of screening: Colonoscopy. Completed 05/06/2013. Repeat every 10 years  Mammogram status: Completed 05/08/2020. Repeat every year  Bone Density status: Completed 05/08/2020. Results reflect: Bone density results: NORMAL. Repeat every 2-5 years.  Lung Cancer Screening: (Low Dose CT Chest recommended if Age 70-80years, 30 pack-year currently smoking OR have quit w/in 15years.) does not qualify.  Additional Screening:  Hepatitis C Screening: does qualify; Completed due  Vision Screening: Recommended annual ophthalmology exams for early  detection of glaucoma and other disorders of the eye. Is the patient up to date with their annual eye exam?  Yes  Who is the provider or what is the name of the office in which the patient attends annual eye exams[facility] provider, would they like to be referred to a provider to establish care? No .   Dental Screening: Recommended annual dental exams for proper oral hygiene  Community Resource Referral / Chronic Care Management: CRR required this visit?  No   CCM required this visit?  No      Plan:     I have personally reviewed and noted the following in the patient's chart:   Medical and social history Use of alcohol, tobacco or illicit drugs  Current medications and supplements including opioid prescriptions.  Functional ability and status Nutritional status Physical activity Advanced directives List of other physicians Hospitalizations, surgeries, and ER visits in previous 12 months Vitals Screenings to include cognitive, depression, and falls  Referrals and appointments  In addition, I have reviewed and discussed with patient certain preventive protocols, quality metrics, and best practice recommendations. A written personalized care plan for preventive services as well as general preventive health recommendations were provided to patient.   Due to this being a telephonic visit, the after visit summary with patients personalized plan was offered to patient via office or my-chart. Patient preferred to pick up at office at next visit or via mychart.   Andrez Grime, LPN   01/15/9448

## 2021-01-17 NOTE — Progress Notes (Signed)
PCP notes:  Health Maintenance: Shingrix- due   Abnormal Screenings: none   Patient concerns: none   Nurse concerns: none   Next PCP appt.: none

## 2021-01-22 ENCOUNTER — Ambulatory Visit: Payer: Medicare HMO | Admitting: Podiatry

## 2021-01-22 ENCOUNTER — Other Ambulatory Visit: Payer: Self-pay

## 2021-01-22 DIAGNOSIS — M778 Other enthesopathies, not elsewhere classified: Secondary | ICD-10-CM

## 2021-01-22 DIAGNOSIS — R600 Localized edema: Secondary | ICD-10-CM | POA: Diagnosis not present

## 2021-01-22 NOTE — Progress Notes (Signed)
   HPI: 70 y.o. female presenting today for follow-up evaluation of the foot sprain to the left foot   patient states that about 2 weeks prior to her last appointment she twisted her left foot on some uneven ground.  Initially she thought that she only sprained it however the pain has continued.  It was very painful to the lateral aspect of the foot.  Last visit she received a steroid injection shot and meloxicam was prescribed.  She also wore the cam boot for about 3 weeks.  She states that she is significantly better.  She has no pain or tenderness associated to the area.  She stopped wearing the cam boot on July 1 and she has been in good supportive sneakers with no complaints  Past Medical History:  Diagnosis Date   Allergic rhinitis    Breast cancer ([place]) 1996   right breast   DM2 (diabetes mellitus, type 2) (HCC)    GERD (gastroesophageal reflux disease)    Hx of cardiovascular stress test    a. Lex MV 2/14:  EF 69%, no ischemia   Hyperlipidemia    Hypertension    IDA (iron deficiency anemia) 08/27/2020   Iron deficiency anemia    Obesity    Osteoarthritis    hands   Ulcer      Physical Exam: General: The patient is alert and oriented x3 in no acute distress.  Dermatology: Skin is warm, dry and supple bilateral lower extremities. Negative for open lesions or macerations.  Vascular: Palpable pedal pulses bilaterally. No edema or erythema noted. Capillary refill within normal limits.  Neurological: Epicritic and protective threshold grossly intact bilaterally.   Musculoskeletal Exam: No pedal deformities noted.  Range of motion normal to all pedal and ankle joints bilateral.  Negative for any significant pain on palpation to the lateral aspect of the left foot towards the base of the fourth metatarsal  Assessment: 1.  Left foot sprain/capsulitis; resolved   Plan of Care:  1. Patient evaluated. 2.  Continue wearing good supportive shoes and sneakers. 3.  Patient may resume  full activity no restrictions 4.  Continue meloxicam as needed 5.  Return to clinic as needed     Edrick Kins, DPM [facility]  Dr. Edrick Kins, DPM    [address], [place] [address]                Office [phone]  Fax [phone]

## 2021-01-31 ENCOUNTER — Ambulatory Visit: Payer: Medicare HMO | Admitting: Internal Medicine

## 2021-01-31 ENCOUNTER — Encounter: Payer: Self-pay | Admitting: Internal Medicine

## 2021-01-31 ENCOUNTER — Encounter: Payer: Self-pay | Admitting: Oncology

## 2021-01-31 ENCOUNTER — Other Ambulatory Visit (INDEPENDENT_AMBULATORY_CARE_PROVIDER_SITE_OTHER): Payer: Medicare HMO

## 2021-01-31 VITALS — BP 186/86 | HR 120 | Ht 59.5 in | Wt 166.1 lb

## 2021-01-31 DIAGNOSIS — K219 Gastro-esophageal reflux disease without esophagitis: Secondary | ICD-10-CM | POA: Diagnosis not present

## 2021-01-31 DIAGNOSIS — K5901 Slow transit constipation: Secondary | ICD-10-CM

## 2021-01-31 DIAGNOSIS — E538 Deficiency of other specified B group vitamins: Secondary | ICD-10-CM

## 2021-01-31 DIAGNOSIS — D508 Other iron deficiency anemias: Secondary | ICD-10-CM

## 2021-01-31 LAB — CBC WITH DIFFERENTIAL/PLATELET
Basophils Absolute: 0.1 10*3/uL (ref 0.0–0.1)
Basophils Relative: 1.4 % (ref 0.0–3.0)
Eosinophils Absolute: 0.3 10*3/uL (ref 0.0–0.7)
Eosinophils Relative: 3.8 % (ref 0.0–5.0)
HCT: 35.7 % — ABNORMAL LOW (ref 36.0–46.0)
Hemoglobin: 11.6 g/dL — ABNORMAL LOW (ref 12.0–15.0)
Lymphocytes Relative: 22.7 % (ref 12.0–46.0)
Lymphs Abs: 1.7 10*3/uL (ref 0.7–4.0)
MCHC: 32.5 g/dL (ref 30.0–36.0)
MCV: 83.8 fl (ref 78.0–100.0)
Monocytes Absolute: 0.5 10*3/uL (ref 0.1–1.0)
Monocytes Relative: 7 % (ref 3.0–12.0)
Neutro Abs: 4.9 10*3/uL (ref 1.4–7.7)
Neutrophils Relative %: 65.1 % (ref 43.0–77.0)
Platelets: 418 10*3/uL — ABNORMAL HIGH (ref 150.0–400.0)
RBC: 4.26 Mil/uL (ref 3.87–5.11)
RDW: 14.4 % (ref 11.5–15.5)
WBC: 7.5 10*3/uL (ref 4.0–10.5)

## 2021-01-31 LAB — VITAMIN B12: Vitamin B-12: 163 pg/mL — ABNORMAL LOW (ref 211–911)

## 2021-01-31 LAB — FERRITIN: Ferritin: 16.5 ng/mL (ref 10.0–291.0)

## 2021-01-31 MED ORDER — SUTAB 1479-225-188 MG PO TABS: 1.0000 | ORAL_TABLET | Freq: Once | ORAL | 0 refills | Status: AC

## 2021-01-31 NOTE — Progress Notes (Signed)
HISTORY OF PRESENT ILLNESS:  Martha Hamilton is a 70 y.o. female with past medical history as listed below.  She has a history of GERD with prior fundoplication.  Also history of recurrent iron deficiency anemia.  Most recent upper endoscopy February 08, 2019 revealed evidence of prior fundoplication but was otherwise normal.  Her last colonoscopy was performed 2014.  This was normal including ileal intubation.  Patient sent today by her primary care provider and hematologist regarding iron deficiency anemia.  Patient tells me that she was not tolerating iron 3 times daily, thus it was stopped.  This year she has been anemic with hemoglobin as low as 8.9.  Low ferritin.  Also low B12.  She has been receiving both iron and B12 replacement therapy.  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  No small bowel interrogation.  She tells me that she is receiving iron and B12 replacement therapies.  No blood work since April.  GI review of systems at this time is remarkable for belching, bloating, and constipation.  This is unchanged.  She has had weight gain.  She tells me that she had Hemoccult studies performed which were negative (though I do not identify those in the record).  Ultrasound 2020 to evaluate epigastric pain revealed no significant abnormalities.  REVIEW OF SYSTEMS:  All non-GI ROS negative as otherwise stated in the HPI except for headaches, cough, visual change, arthritis, allergies  Past Medical History:  Diagnosis Date   Allergic rhinitis    Breast cancer ([place]) 1996   right breast   DM2 (diabetes mellitus, type 2) ([place])    GERD (gastroesophageal reflux disease)    Hx of cardiovascular stress test    a. Lex MV 2/14:  EF 69%, no ischemia   Hyperlipidemia    Hypertension    IDA (iron deficiency anemia) 08/27/2020   Iron deficiency anemia    Obesity    Osteoarthritis    hands   Ulcer     Past Surgical History:  Procedure Laterality Date    APPENDECTOMY  1976   BREAST LUMPECTOMY Right 1996   right breast, lumpectomy w/nodes   CATARACT EXTRACTION W/PHACO Left 11/28/2020   Procedure: CATARACT EXTRACTION PHACO AND INTRAOCULAR LENS PLACEMENT ([place]) LEFT DIABETIC;  Surgeon: Leandrew Koyanagi, MD;  Location: [place];  Service: Ophthalmology;  Laterality: Left;  5.02 01:07.4 7.4%   CATARACT EXTRACTION W/PHACO Right 12/12/2020   Procedure: CATARACT EXTRACTION PHACO AND INTRAOCULAR LENS PLACEMENT ([facility]) RIGHT DIABETIC 5.22 00:59.2;  Surgeon: Leandrew Koyanagi, MD;  Location: [place];  Service: Ophthalmology;  Laterality: Right;   CHONDROPLASTY Left 02/10/2018   Procedure: CHONDROPLASTY;  Surgeon: Lovell Sheehan, MD;  Location: [facility];  Service: Orthopedics;  Laterality: Left;   GASTRIC RESTRICTION SURGERY     for reflux   HYSTEROSCOPY WITH D & C N/A 10/18/2012   Procedure: DILATATION AND CURETTAGE /HYSTEROSCOPY;  Surgeon: Mora Bellman, MD;  Location: [facility];  Service: Gynecology;  Laterality: N/A;   KNEE ARTHROSCOPY WITH MEDIAL MENISECTOMY Left 02/10/2018   Procedure: KNEE ARTHROSCOPY WITH MEDIAL and LATERAL MENISECTOMY;  Surgeon: Lovell Sheehan, MD;  Location: [facility];  Service: Orthopedics;  Laterality: Left;   POLYPECTOMY N/A 10/18/2012   Procedure: POLYPECTOMY;  Surgeon: Mora Bellman, MD;  Location: [facility];  Service: Gynecology;  Laterality: N/A;   SYNOVECTOMY Left 02/10/2018   Procedure: SYNOVECTOMY;  Surgeon: Lovell Sheehan, MD;  Location: [facility];  Service: Orthopedics;  Laterality:  Left;    Social History Martha Hamilton  reports that she has never smoked. She has never used smokeless tobacco. She reports that she does not drink alcohol and does not use drugs.  family history includes CAD (age of onset: 50) in her father; Diabetes in her mother; Esophageal cancer in her brother; Heart disease in her mother and sister; Hypertension in her mother; Kidney cancer in her mother; Stroke in her  father.  Allergies  Allergen Reactions   Lipitor [Atorvastatin] Other (See Comments)    Muscle cramping   Niacin Nausea And Vomiting   Questran [Cholestyramine] Other (See Comments)    REACTION: Muscles tightened up, drew up.   Zocor [Simvastatin]     Muscular pain       PHYSICAL EXAMINATION: Vital signs: BP (!) 186/86 (BP Location: Left Arm, Patient Position: Sitting, Cuff Size: Large)   Pulse (!) 120   Ht 4' 11.5" (1.511 m) Comment: height measured without shoes  Wt 166 lb 2 oz (75.4 kg)   LMP 06/13/2001   BMI 32.99 kg/m   Constitutional: generally well-appearing, no acute distress Psychiatric: alert and oriented x3, cooperative Eyes: extraocular movements intact, anicteric, conjunctiva pink Mouth: oral pharynx moist, no lesions Neck: supple no lymphadenopathy Cardiovascular: heart regular rate and rhythm, no murmur Lungs: clear to auscultation bilaterally Abdomen: soft, nontender, nondistended, no obvious ascites, no peritoneal signs, normal bowel sounds, no organomegaly Rectal: Deferred until colonoscopy Extremities: no clubbing, cyanosis, or lower extremity edema bilaterally Skin: no lesions on visible extremities Neuro: No focal deficits.  Cranial nerves intact  ASSESSMENT:  1.  Recurrent iron deficiency anemia. 2.  B12 deficiency 3.  Normal colonoscopy with ileal intubation 2014 4.  History of inflammatory gastric polyp removed endoscopically.  Last, follow-up, endoscopy July 2020 was normal post fundoplication 5.  GERD status post fundoplication.  Recurrent symptoms post fundoplication now requiring omeprazole therapy. 6.  Chronic abdominal complaints 7.  General medical problems as outlined above.  Including diabetes mellitus.   PLAN:  1.  B12 and ferritin level as well as CBC today 2.  Schedule colonoscopy to evaluate recurrent iron deficiency anemia.  Reasonable given 8 years since her last exam.The nature of the procedure, as well as the risks, benefits,  and alternatives were carefully and thoroughly reviewed with the patient. Ample time for discussion and questions allowed. The patient understood, was satisfied, and agreed to proceed.  3.  Schedule upper endoscopy to evaluate recurrent iron deficiency anemia.The nature of the procedure, as well as the risks, benefits, and alternatives were carefully and thoroughly reviewed with the patient. Ample time for discussion and questions allowed. The patient understood, was satisfied, and agreed to proceed.  4.  May need capsule endoscopy if the above studies are unrevealing. 5.  Aggressive replacement of B12 deficiency per her hematologist/PCP 6.  Aggressive iron replacement per her hematologist/PCP 7.  Close monitoring of blood counts per her hematologist/PCP A.  Reflux precautions 9.  Continue omeprazole for GERD 10.  Hold diabetic medications the day of her procedures in order to avoid unwanted hypoglycemia A total time of 40 minutes was spent preparing to see the patient, reviewing test, obtaining comprehensive history, performing medically appropriate physical examination, counseling the patient regarding her multiple above listed issues, ordering laboratories and several endoscopic procedures, and documenting clinical information in the health record.  ADDENDUM: Laboratories from today have returned.  Hemoglobin has improved to 11.6.  Still with B12 deficiency (163) and low normal ferritin level (16.5).  I  have forwarded this information to the patient, her PCP, and hematologist for ongoing replacement therapies and monitoring

## 2021-01-31 NOTE — Patient Instructions (Signed)
If you are age 70 or older, your body mass index should be between 23-30. Your Body mass index is 32.99 kg/m. If this is out of the aforementioned range listed, please consider follow up with your Primary Care Provider.  If you are age 94 or younger, your body mass index should be between 19-25. Your Body mass index is 32.99 kg/m. If this is out of the aformentioned range listed, please consider follow up with your Primary Care Provider.   __________________________________________________________  The [facility] providers would like to encourage you to use [facility] to communicate with providers for non-urgent requests or questions.  Due to long hold times on the telephone, sending your provider a message by [facility] may be a faster and more efficient way to get a response.  Please allow 48 business hours for a response.  Please remember that this is for non-urgent requests.   Your provider has requested that you go to the basement level for lab work before leaving today. Press "B" on the elevator. The lab is located at the first door on the left as you exit the elevator.  You have been scheduled for an endoscopy and colonoscopy. Please follow the written instructions given to you at your visit today. Please pick up your prep supplies at the pharmacy within the next 1-3 days. If you use inhalers (even only as needed), please bring them with you on the day of your procedure.

## 2021-02-04 ENCOUNTER — Other Ambulatory Visit: Payer: Self-pay

## 2021-02-04 ENCOUNTER — Inpatient Hospital Stay: Payer: Medicare HMO | Attending: Oncology

## 2021-02-04 DIAGNOSIS — D508 Other iron deficiency anemias: Secondary | ICD-10-CM

## 2021-02-04 DIAGNOSIS — E538 Deficiency of other specified B group vitamins: Secondary | ICD-10-CM | POA: Diagnosis not present

## 2021-02-04 MED ORDER — CYANOCOBALAMIN 1000 MCG/ML IJ SOLN
1000.0000 ug | Freq: Once | INTRAMUSCULAR | Status: AC
  Administered 2021-02-04: 1000 ug via INTRAMUSCULAR
  Filled 2021-02-04: qty 1

## 2021-02-05 DIAGNOSIS — M1712 Unilateral primary osteoarthritis, left knee: Secondary | ICD-10-CM | POA: Diagnosis not present

## 2021-03-04 ENCOUNTER — Inpatient Hospital Stay: Payer: Medicare HMO | Attending: Oncology

## 2021-03-04 DIAGNOSIS — D508 Other iron deficiency anemias: Secondary | ICD-10-CM

## 2021-03-04 DIAGNOSIS — E538 Deficiency of other specified B group vitamins: Secondary | ICD-10-CM | POA: Diagnosis not present

## 2021-03-04 MED ORDER — CYANOCOBALAMIN 1000 MCG/ML IJ SOLN
1000.0000 ug | Freq: Once | INTRAMUSCULAR | Status: AC
  Administered 2021-03-04: 1000 ug via INTRAMUSCULAR
  Filled 2021-03-04: qty 1

## 2021-03-17 ENCOUNTER — Other Ambulatory Visit: Payer: Self-pay | Admitting: Podiatry

## 2021-03-18 NOTE — Telephone Encounter (Signed)
Please advise 

## 2021-04-05 ENCOUNTER — Telehealth: Payer: Self-pay

## 2021-04-05 ENCOUNTER — Other Ambulatory Visit: Payer: Self-pay | Admitting: Family Medicine

## 2021-04-05 DIAGNOSIS — Z1231 Encounter for screening mammogram for malignant neoplasm of breast: Secondary | ICD-10-CM

## 2021-04-05 NOTE — Chronic Care Management (AMB) (Addendum)
Chronic Care Management Pharmacy Assistant   Name: Martha Hamilton  MRN: [id number] DOB: 13-Feb-1951  Reason for Encounter: Hypertension REVIEW   Recent office visits:  01/17/21-PCP- Patient presented for AWV-Telemedicine-discussed home exercise routine,recomment annual ophthalmology exam for glaucoma and other disorders of the eye .Shingrix due.  Recent consult visits:  03/04/21-Oncology-Infusion 02/05/21-Orthopedics-Patient presented for cortisone injection knee. 01/31/21-Gastroenterology-Patient presented for follow up on GERD,labs ordered-Your B12 levels remain low.  You need proper replacement in order to correct your anemia (which is improving).  Iron levels are low normal.  Continue to work with your primary care provider and hematologist regarding aggressive replacement of B12 and iron (both of which I believe you will need long-term) with close monitoring of your blood counts,schedule colonoscopy 01/22/21-Podiatry-Patient presented for follow up left foot sprain.Continue wearing good supportive shoes.Follow up as needed.  Hospital visits:  None in previous 6 months  Medications: Outpatient Encounter Medications as of 04/05/2021  Medication Sig   Accu-Chek Softclix Lancets lancets TEST BLOOD SUGAR EVERY DAY FOR DIABETES   acetaminophen (TYLENOL) 500 MG tablet Take 1,000 mg by mouth every 6 (six) hours as needed for mild pain.    Alcohol Swabs (B-D SINGLE USE SWABS REGULAR) PADS USE ONE TIME DAILY AS DIRECTED WHEN CHECKING BLOOD SUGAR   amitriptyline (ELAVIL) 25 MG tablet Take 1 tablet (25 mg total) by mouth at bedtime.   aspirin 81 MG tablet Take 81 mg by mouth at bedtime.    Blood Glucose Calibration (ACCU-CHEK AVIVA) SOLN USE TO CALIBRATE METER AS DIRECTED   Blood Glucose Monitoring Suppl (ACCU-CHEK AVIVA PLUS) w/Device KIT Use to check blood sugar once daily for DM (dx. E11.9)   calcium elemental as carbonate (BARIATRIC TUMS ULTRA) 400 MG chewable tablet Chew 1,000 mg by mouth  daily as needed for heartburn.   Camphor-Menthol-Methyl Sal (SALONPAS) 3.07-19-08 % PTCH Apply topically.   Cholecalciferol (VITAMIN D) 2000 units tablet Take 2,000 Units by mouth daily.   fenofibrate (TRICOR) 145 MG tablet TAKE 1 TABLET EVERY DAY   glipiZIDE (GLUCOTROL XL) 2.5 MG 24 hr tablet TAKE 1 TABLET EVERY EVENING   glucose blood (ACCU-CHEK AVIVA PLUS) test strip TEST BLOOD SUGAR EVERY DAY FOR DIABETES (Dx. E11.9)   loratadine (CLARITIN) 10 MG tablet Take 10 mg by mouth daily as needed for allergies.   losartan (COZAAR) 50 MG tablet TAKE 1 TABLET EVERY DAY   meclizine (ANTIVERT) 25 MG tablet Take 1 tablet (25 mg total) by mouth 3 (three) times daily as needed for dizziness.   meloxicam (MOBIC) 15 MG tablet TAKE 1 TABLET(15 MG) BY MOUTH DAILY   metFORMIN (GLUCOPHAGE) 1000 MG tablet Take 0.5 tablets (500 mg total) by mouth 2 (two) times daily with a meal. TAKE 1/2 (520m) TABLET TWICE DAILY WITH A MEAL   omeprazole (PRILOSEC) 40 MG capsule TAKE 1 CAPSULE IN THE MORNING AND AT BEDTIME.   ondansetron (ZOFRAN) 4 MG tablet TAKE 1 TABLET EVERY 4 HOURS AS NEEDED FOR NAUSEA AND VOMITING   SALINE NASAL SPRAY NA Place 1 spray into the nose daily as needed (congestion).    No facility-administered encounter medications on file as of 04/05/2021.     Recent Office Vitals: BP Readings from Last 3 Encounters:  01/31/21 (!) 186/86  12/14/20 (!) 149/85  12/12/20 136/79   Pulse Readings from Last 3 Encounters:  01/31/21 (!) 120  12/14/20 (!) 117  12/12/20 98    Wt Readings from Last 3 Encounters:  01/31/21 166 lb 2 oz (75.4 kg)  12/12/20 164 lb (74.4 kg)  11/28/20 164 lb 12.8 oz (74.8 kg)     Kidney Function Lab Results  Component Value Date/Time   CREATININE 1.20 11/08/2020 08:26 AM   CREATININE 1.36 (H) 08/27/2020 04:11 PM   GFR 46.10 (L) 11/08/2020 08:26 AM   GFRNONAA 42 (L) 08/27/2020 04:11 PM   GFRAA >60 02/02/2018 10:56 AM    BMP Latest Ref Rng & Units 11/08/2020 08/27/2020  08/08/2020  Glucose 70 - 99 mg/dL 117(H) 108(H) 89  BUN 6 - 23 mg/dL 19 26(H) 19  Creatinine 0.40 - 1.20 mg/dL 1.20 1.36(H) 1.23(H)  Sodium 135 - 145 mEq/L 139 136 136  Potassium 3.5 - 5.1 mEq/L 4.5 4.8 4.5  Chloride 96 - 112 mEq/L 103 103 102  CO2 19 - 32 mEq/L 27 21(L) 26  Calcium 8.4 - 10.5 mg/dL 9.6 9.5 9.7    Attempted contact with Floreen Comber Borneman 3 times on 04/08/21,04/09/21,04/10/21. Unsuccessful outreach. Will attempt contact next month.   Current antihypertensive regimen:  Losartan 50 mg daily  Adherence Review: Is the patient currently on ACE/ARB medication? Yes Does the patient have >5 day gap between last estimated fill dates? No   Star Rating Drugs:  Medication:  Last Fill: Day Supply Metformin 1050m 01/26/21 90 Glipizide 2.555m7/16/22 90 Losartan 5080m/16/22 90  Care Gaps: Annual wellness visit in last year? Yes  01/17/21 telemedicine Most Recent BP reading: 186/86  120-P  If Diabetic: Most recent A1C reading: 6.0 11/08/20 Last eye exam / retinopathy screening: 05/09/20 [facility] Last diabetic foot exam: 07/24/20   No appointments scheduled within the next 30 days.  Several appointments for infusions.  MicDebbora DusPP notified  VelAvel SensorCMFence Lakesistant [phone] have reviewed the care management and care coordination activities outlined in this encounter and I am certifying that I agree with the content of this note. No further action required.  MicDebbora DusharmD Clinical Pharmacist [phone]

## 2021-04-08 ENCOUNTER — Inpatient Hospital Stay: Payer: Medicare HMO | Attending: Oncology

## 2021-04-08 ENCOUNTER — Other Ambulatory Visit: Payer: Self-pay

## 2021-04-08 DIAGNOSIS — D508 Other iron deficiency anemias: Secondary | ICD-10-CM

## 2021-04-08 DIAGNOSIS — E538 Deficiency of other specified B group vitamins: Secondary | ICD-10-CM | POA: Insufficient documentation

## 2021-04-08 MED ORDER — CYANOCOBALAMIN 1000 MCG/ML IJ SOLN
1000.0000 ug | Freq: Once | INTRAMUSCULAR | Status: AC
  Administered 2021-04-08: 1000 ug via INTRAMUSCULAR
  Filled 2021-04-08: qty 1

## 2021-04-29 ENCOUNTER — Other Ambulatory Visit: Payer: Self-pay

## 2021-04-29 ENCOUNTER — Inpatient Hospital Stay: Payer: Medicare HMO | Attending: Oncology

## 2021-04-29 DIAGNOSIS — K648 Other hemorrhoids: Secondary | ICD-10-CM | POA: Diagnosis not present

## 2021-04-29 DIAGNOSIS — Z808 Family history of malignant neoplasm of other organs or systems: Secondary | ICD-10-CM | POA: Diagnosis not present

## 2021-04-29 DIAGNOSIS — E538 Deficiency of other specified B group vitamins: Secondary | ICD-10-CM | POA: Diagnosis not present

## 2021-04-29 DIAGNOSIS — D508 Other iron deficiency anemias: Secondary | ICD-10-CM

## 2021-04-29 DIAGNOSIS — Z8051 Family history of malignant neoplasm of kidney: Secondary | ICD-10-CM | POA: Insufficient documentation

## 2021-04-29 DIAGNOSIS — D509 Iron deficiency anemia, unspecified: Secondary | ICD-10-CM | POA: Insufficient documentation

## 2021-04-29 DIAGNOSIS — E119 Type 2 diabetes mellitus without complications: Secondary | ICD-10-CM | POA: Insufficient documentation

## 2021-04-29 DIAGNOSIS — I10 Essential (primary) hypertension: Secondary | ICD-10-CM | POA: Diagnosis not present

## 2021-04-29 LAB — CBC WITH DIFFERENTIAL/PLATELET
Abs Immature Granulocytes: 0.06 10*3/uL (ref 0.00–0.07)
Basophils Absolute: 0.1 10*3/uL (ref 0.0–0.1)
Basophils Relative: 1 %
Eosinophils Absolute: 0.3 10*3/uL (ref 0.0–0.5)
Eosinophils Relative: 4 %
HCT: 31.8 % — ABNORMAL LOW (ref 36.0–46.0)
Hemoglobin: 10.2 g/dL — ABNORMAL LOW (ref 12.0–15.0)
Immature Granulocytes: 1 %
Lymphocytes Relative: 19 %
Lymphs Abs: 1.6 10*3/uL (ref 0.7–4.0)
MCH: 26.6 pg (ref 26.0–34.0)
MCHC: 32.1 g/dL (ref 30.0–36.0)
MCV: 82.8 fL (ref 80.0–100.0)
Monocytes Absolute: 0.6 10*3/uL (ref 0.1–1.0)
Monocytes Relative: 7 %
Neutro Abs: 6 10*3/uL (ref 1.7–7.7)
Neutrophils Relative %: 68 %
Platelets: 491 10*3/uL — ABNORMAL HIGH (ref 150–400)
RBC: 3.84 MIL/uL — ABNORMAL LOW (ref 3.87–5.11)
RDW: 13.2 % (ref 11.5–15.5)
WBC: 8.7 10*3/uL (ref 4.0–10.5)
nRBC: 0 % (ref 0.0–0.2)

## 2021-04-29 LAB — IRON AND TIBC
Iron: 33 ug/dL (ref 28–170)
Saturation Ratios: 6 % — ABNORMAL LOW (ref 10.4–31.8)
TIBC: 528 ug/dL — ABNORMAL HIGH (ref 250–450)
UIBC: 495 ug/dL

## 2021-04-29 LAB — FERRITIN: Ferritin: 8 ng/mL — ABNORMAL LOW (ref 11–307)

## 2021-05-01 ENCOUNTER — Other Ambulatory Visit: Payer: Self-pay

## 2021-05-01 ENCOUNTER — Encounter: Payer: Self-pay | Admitting: Internal Medicine

## 2021-05-01 ENCOUNTER — Ambulatory Visit (AMBULATORY_SURGERY_CENTER): Payer: Medicare HMO | Admitting: Internal Medicine

## 2021-05-01 VITALS — BP 130/64 | HR 93 | Temp 98.2°F | Resp 16 | Ht 59.0 in | Wt 166.0 lb

## 2021-05-01 DIAGNOSIS — E538 Deficiency of other specified B group vitamins: Secondary | ICD-10-CM

## 2021-05-01 DIAGNOSIS — K5901 Slow transit constipation: Secondary | ICD-10-CM | POA: Diagnosis not present

## 2021-05-01 DIAGNOSIS — K219 Gastro-esophageal reflux disease without esophagitis: Secondary | ICD-10-CM | POA: Diagnosis not present

## 2021-05-01 DIAGNOSIS — D508 Other iron deficiency anemias: Secondary | ICD-10-CM | POA: Diagnosis not present

## 2021-05-01 DIAGNOSIS — D125 Benign neoplasm of sigmoid colon: Secondary | ICD-10-CM

## 2021-05-01 DIAGNOSIS — D123 Benign neoplasm of transverse colon: Secondary | ICD-10-CM | POA: Diagnosis not present

## 2021-05-01 DIAGNOSIS — K5730 Diverticulosis of large intestine without perforation or abscess without bleeding: Secondary | ICD-10-CM | POA: Diagnosis not present

## 2021-05-01 DIAGNOSIS — K648 Other hemorrhoids: Secondary | ICD-10-CM | POA: Diagnosis not present

## 2021-05-01 DIAGNOSIS — D500 Iron deficiency anemia secondary to blood loss (chronic): Secondary | ICD-10-CM

## 2021-05-01 MED ORDER — SODIUM CHLORIDE 0.9 % IV SOLN: 500.0000 mL | Freq: Once | INTRAVENOUS | Status: DC

## 2021-05-01 NOTE — Progress Notes (Signed)
Pt's states no medical or surgical changes since previsit or office visit. 

## 2021-05-01 NOTE — Patient Instructions (Signed)
Handouts on diverticulosis, polyps, and hemorrhoids given to patient.  Await pathology results.  Repeat colonoscopy for surveillance in 7 years if polyps adenomatous, otherwise as needed. Schedule Capsule Endoscopy "iron deficiency anemia, rule out small bowel lesion" Return to the care of your hematologist to assure adequate iron replacement therapy, B12 replacement therapy, and normalization of your blood counts.   YOU HAD AN ENDOSCOPIC PROCEDURE TODAY AT [facility]:   Refer to the procedure report that was given to you for any specific questions about what was found during the examination.  If the procedure report does not answer your questions, please call your gastroenterologist to clarify.  If you requested that your care partner not be given the details of your procedure findings, then the procedure report has been included in a sealed envelope for you to review at your convenience later.  YOU SHOULD EXPECT: Some feelings of bloating in the abdomen. Passage of more gas than usual.  Walking can help get rid of the air that was put into your GI tract during the procedure and reduce the bloating. If you had a lower endoscopy (such as a colonoscopy or flexible sigmoidoscopy) you may notice spotting of blood in your stool or on the toilet paper. If you underwent a bowel prep for your procedure, you may not have a normal bowel movement for a few days.  Please Note:  You might notice some irritation and congestion in your nose or some drainage.  This is from the oxygen used during your procedure.  There is no need for concern and it should clear up in a day or so.  SYMPTOMS TO REPORT IMMEDIATELY:  Following lower endoscopy (colonoscopy or flexible sigmoidoscopy):  Excessive amounts of blood in the stool  Significant tenderness or worsening of abdominal pains  Swelling of the abdomen that is new, acute  Fever of 100F or higher  Following upper endoscopy (EGD)  Vomiting of blood  or coffee ground material  New chest pain or pain under the shoulder blades  Painful or persistently difficult swallowing  New shortness of breath  Fever of 100F or higher  Black, tarry-looking stools  For urgent or emergent issues, a gastroenterologist can be reached at any hour by calling [phone]. Do not use MyChart messaging for urgent concerns.    DIET:  We do recommend a small meal at first, but then you may proceed to your regular diet.  Drink plenty of fluids but you should avoid alcoholic beverages for 24 hours.  ACTIVITY:  You should plan to take it easy for the rest of today and you should NOT DRIVE or use heavy machinery until tomorrow (because of the sedation medicines used during the test).    FOLLOW UP: Our staff will call the number listed on your records 48-72 hours following your procedure to check on you and address any questions or concerns that you may have regarding the information given to you following your procedure. If we do not reach you, we will leave a message.  We will attempt to reach you two times.  During this call, we will ask if you have developed any symptoms of COVID 19. If you develop any symptoms (ie: fever, flu-like symptoms, shortness of breath, cough etc.) before then, please call [phone].  If you test positive for Covid 19 in the 2 weeks post procedure, please call and report this information to [place].    If any biopsies were taken you will be contacted by phone  or by letter within the next 1-3 weeks.  Please call us at [phone] if you have not heard about the biopsies in 3 weeks.    SIGNATURES/CONFIDENTIALITY: You and/or your care partner have signed paperwork which will be entered into your electronic medical record.  These signatures attest to the fact that that the information above on your After Visit Summary has been reviewed and is understood.  Full responsibility of the confidentiality of this discharge information lies with you  and/or your care-partner.

## 2021-05-01 NOTE — Progress Notes (Signed)
HISTORY OF PRESENT ILLNESS:  Martha Hamilton is a 70 y.o. female who presents today for colonoscopy and upper endoscopy to evaluate iron deficiency anemia.  She was thoroughly evaluated in the office regarding this issue January 31, 2021.  See that complete H&P.  No interval clinical changes  REVIEW OF SYSTEMS:  All non-GI ROS negative. Past Medical History:  Diagnosis Date   Allergic rhinitis    Breast cancer ([place]) 1996   right breast   DM2 (diabetes mellitus, type 2) ([facility])    GERD (gastroesophageal reflux disease)    Hx of cardiovascular stress test    a. Lex MV 2/14:  EF 69%, no ischemia   Hyperlipidemia    Hypertension    IDA (iron deficiency anemia) 08/27/2020   Iron deficiency anemia    Obesity    Osteoarthritis    hands   Ulcer     Past Surgical History:  Procedure Laterality Date   APPENDECTOMY  1976   BREAST LUMPECTOMY Right 1996   right breast, lumpectomy w/nodes   CATARACT EXTRACTION W/PHACO Left 11/28/2020   Procedure: CATARACT EXTRACTION PHACO AND INTRAOCULAR LENS PLACEMENT ([place]) LEFT DIABETIC;  Surgeon: Leandrew Koyanagi, MD;  Location: [place];  Service: Ophthalmology;  Laterality: Left;  5.02 01:07.4 7.4%   CATARACT EXTRACTION W/PHACO Right 12/12/2020   Procedure: CATARACT EXTRACTION PHACO AND INTRAOCULAR LENS PLACEMENT ([facility]) RIGHT DIABETIC 5.22 00:59.2;  Surgeon: Leandrew Koyanagi, MD;  Location: [place];  Service: Ophthalmology;  Laterality: Right;   CHONDROPLASTY Left 02/10/2018   Procedure: CHONDROPLASTY;  Surgeon: Lovell Sheehan, MD;  Location: [facility];  Service: Orthopedics;  Laterality: Left;   GASTRIC RESTRICTION SURGERY     for reflux   HYSTEROSCOPY WITH D & C N/A 10/18/2012   Procedure: DILATATION AND CURETTAGE /HYSTEROSCOPY;  Surgeon: Mora Bellman, MD;  Location: [facility];  Service: Gynecology;  Laterality: N/A;   KNEE ARTHROSCOPY WITH MEDIAL MENISECTOMY Left 02/10/2018   Procedure: KNEE ARTHROSCOPY WITH MEDIAL and LATERAL  MENISECTOMY;  Surgeon: Lovell Sheehan, MD;  Location: [facility];  Service: Orthopedics;  Laterality: Left;   POLYPECTOMY N/A 10/18/2012   Procedure: POLYPECTOMY;  Surgeon: Mora Bellman, MD;  Location: [facility];  Service: Gynecology;  Laterality: N/A;   SYNOVECTOMY Left 02/10/2018   Procedure: SYNOVECTOMY;  Surgeon: Lovell Sheehan, MD;  Location: [facility];  Service: Orthopedics;  Laterality: Left;    Social History Martha Hamilton  reports that she has never smoked. She has never used smokeless tobacco. She reports that she does not drink alcohol and does not use drugs.  family history includes CAD (age of onset: 51) in her father; Diabetes in her mother; Esophageal cancer in her brother; Heart disease in her mother and sister; Hypertension in her mother; Kidney cancer in her mother; Stroke in her father.  Allergies  Allergen Reactions   Lipitor [Atorvastatin] Other (See Comments)    Muscle cramping   Niacin Nausea And Vomiting   Questran [Cholestyramine] Other (See Comments)    REACTION: Muscles tightened up, drew up.   Zocor [Simvastatin]     Muscular pain       PHYSICAL EXAMINATION:  Vital signs: BP (!) 180/74   Pulse (!) 106   Temp 98.2 F (36.8 C) (Temporal)   Resp (!) 5   Ht 4\' 11"  (1.499 m)   Wt 166 lb (75.3 kg)   LMP 06/13/2001   SpO2 98%   BMI 33.53 kg/m  General: Well-developed, well-nourished, no acute distress HEENT: Sclerae  are anicteric, conjunctiva pink. Oral mucosa intact Lungs: Clear Heart: Regular Abdomen: soft, nontender, nondistended, no obvious ascites, no peritoneal signs, normal bowel sounds. No organomegaly. Extremities: No edema Psychiatric: alert and oriented x3. Cooperative     ASSESSMENT:  1.iron deficiency anemia 2.  B12 deficiency   PLAN:  Colonoscopy and upper endoscopy.

## 2021-05-01 NOTE — Op Note (Signed)
[facility] Patient Name: Martha Hamilton Procedure Date: 05/01/2021 11:38 AM MRN: [id number] Endoscopist: Docia Chuck. Martha Hamilton , MD Age: 70 Referring MD:  Date of Birth: [birth year] Gender: Female Account #: [id number] Procedure:                Colonoscopy with cold snare polypectomy x 2 Indications:              Iron deficiency anemia. Also, has B12 deficiency.                            Being followed by hematology Medicines:                Monitored Anesthesia Care Procedure:                Pre-Anesthesia Assessment:                           - Prior to the procedure, a History and Physical                            was performed, and patient medications and                            allergies were reviewed. The patient's tolerance of                            previous anesthesia was also reviewed. The risks                            and benefits of the procedure and the sedation                            options and risks were discussed with the patient.                            All questions were answered, and informed consent                            was obtained. Prior Anticoagulants: The patient has                            taken no previous anticoagulant or antiplatelet                            agents. ASA Grade Assessment: II - A patient with                            mild systemic disease. After reviewing the risks                            and benefits, the patient was deemed in                            satisfactory condition to undergo the procedure.  After obtaining informed consent, the colonoscope                            was passed under direct vision. Throughout the                            procedure, the patient's blood pressure, pulse, and                            oxygen saturations were monitored continuously. The                            CF [id number] #[id number] was introduced through the anus                             and advanced to the the cecum, identified by                            appendiceal orifice and ileocecal valve. The                            terminal ileum, ileocecal valve, appendiceal                            orifice, and rectum were photographed. The quality                            of the bowel preparation was excellent. The                            colonoscopy was performed without difficulty. The                            patient tolerated the procedure well. The bowel                            preparation used was SUPREP via split dose                            instruction. Scope In: 11:50:15 AM Scope Out: 12:01:12 PM Scope Withdrawal Time: 0 hours 8 minutes 34 seconds  Total Procedure Duration: 0 hours 10 minutes 57 seconds  Findings:                 The terminal ileum appeared normal.                           Two polyps were found in the sigmoid colon and                            transverse colon. The polyps were 1 to 5 mm in                            size. These polyps were removed with a cold snare.  Resection and retrieval were complete.                           A few diverticula were found in the sigmoid colon.                           Internal hemorrhoids were found during retroflexion.                           The exam was otherwise without abnormality on                            direct and retroflexion views. Complications:            No immediate complications. Estimated blood loss:                            None. Estimated Blood Loss:     Estimated blood loss: none. Impression:               - The examined portion of the ileum was normal.                           - Two 1 to 5 mm polyps in the sigmoid colon and in                            the transverse colon, removed with a cold snare.                            Resected and retrieved.                           - Diverticulosis in the sigmoid colon.                            - Internal hemorrhoids.                           - The examination was otherwise normal on direct                            and retroflexion views. Recommendation:           - Repeat colonoscopy in 7 years for surveillance if                            polyps adenomatous, otherwise prn.                           - Patient has a contact number available for                            emergencies. The signs and symptoms of potential                            delayed complications were discussed with the  patient. Return to normal activities tomorrow.                            Written discharge instructions were provided to the                            patient.                           - Resume previous diet.                           - Continue present medications.                           - Await pathology results.                           - EGD today. Please see report regarding findings                            and final recommendations Martha Hamilton N. Martha Pastor, MD 05/01/2021 12:06:20 PM This report has been signed electronically.

## 2021-05-01 NOTE — Progress Notes (Signed)
Called to room to assist during endoscopic procedure.  Patient ID and intended procedure confirmed with present staff. Received instructions for my participation in the procedure from the performing physician.  

## 2021-05-01 NOTE — Op Note (Signed)
[facility] Patient Name: Martha Hamilton Procedure Date: 05/01/2021 12:04 PM MRN: [id number] Endoscopist: Docia Chuck. Henrene Pastor , MD Age: 70 Referring MD:  Date of Birth: June 07, 1951 Gender: Female Account #: [id number] Procedure:                Upper GI endoscopy with biopsies Indications:              Iron deficiency anemia Medicines:                Monitored Anesthesia Care Procedure:                Pre-Anesthesia Assessment:                           - Prior to the procedure, a History and Physical                            was performed, and patient medications and                            allergies were reviewed. The patient's tolerance of                            previous anesthesia was also reviewed. The risks                            and benefits of the procedure and the sedation                            options and risks were discussed with the patient.                            All questions were answered, and informed consent                            was obtained. Prior Anticoagulants: The patient has                            taken no previous anticoagulant or antiplatelet                            agents. ASA Grade Assessment: II - A patient with                            mild systemic disease. After reviewing the risks                            and benefits, the patient was deemed in                            satisfactory condition to undergo the procedure.                           After obtaining informed consent, the endoscope was  passed under direct vision. Throughout the                            procedure, the patient's blood pressure, pulse, and                            oxygen saturations were monitored continuously. The                            GIF [id number] #[id number] was introduced through the                            mouth, and advanced to the second part of duodenum.                            The upper GI endoscopy  was accomplished without                            difficulty. The patient tolerated the procedure                            well. Scope In: Scope Out: Findings:                 The esophagus was normal.                           The stomach was normal. Prior fundoplication.                           The examined duodenum was normal. Biopsies for                            histology were taken with a cold forceps for                            evaluation of celiac disease.                           The cardia and gastric fundus were normal on                            retroflexion. Complications:            No immediate complications. Estimated Blood Loss:     Estimated blood loss: none. Impression:               1. Unremarkable EGD                           2. No GI cause for iron deficiency anemia found on                            either colonoscopy or upper endoscopy Recommendation:           1. Follow-up duodenal biopsies  2. Schedule CAPSULE ENDOSCOPY "iron deficiency                            anemia, rule out small bowel lesion".                           3. Return to the care of your hematologist to                            assure adequate iron replacement therapy, B12                            replacement therapy, and normalization of your                            blood counts. Docia Chuck. Henrene Pastor, MD 05/01/2021 12:16:11 PM This report has been signed electronically.

## 2021-05-01 NOTE — Progress Notes (Signed)
Pt Drowsy. VSS. To PACU, report to RN. No anesthetic complications noted.  

## 2021-05-02 ENCOUNTER — Other Ambulatory Visit: Payer: Medicare HMO

## 2021-05-03 ENCOUNTER — Inpatient Hospital Stay (HOSPITAL_BASED_OUTPATIENT_CLINIC_OR_DEPARTMENT_OTHER): Payer: Medicare HMO | Admitting: Oncology

## 2021-05-03 ENCOUNTER — Other Ambulatory Visit: Payer: Self-pay

## 2021-05-03 ENCOUNTER — Telehealth: Payer: Self-pay | Admitting: *Deleted

## 2021-05-03 ENCOUNTER — Inpatient Hospital Stay: Payer: Medicare HMO

## 2021-05-03 DIAGNOSIS — Z8051 Family history of malignant neoplasm of kidney: Secondary | ICD-10-CM | POA: Diagnosis not present

## 2021-05-03 DIAGNOSIS — D508 Other iron deficiency anemias: Secondary | ICD-10-CM | POA: Diagnosis not present

## 2021-05-03 DIAGNOSIS — D509 Iron deficiency anemia, unspecified: Secondary | ICD-10-CM | POA: Diagnosis not present

## 2021-05-03 DIAGNOSIS — E538 Deficiency of other specified B group vitamins: Secondary | ICD-10-CM | POA: Diagnosis not present

## 2021-05-03 DIAGNOSIS — I10 Essential (primary) hypertension: Secondary | ICD-10-CM | POA: Diagnosis not present

## 2021-05-03 DIAGNOSIS — Z808 Family history of malignant neoplasm of other organs or systems: Secondary | ICD-10-CM | POA: Diagnosis not present

## 2021-05-03 DIAGNOSIS — K648 Other hemorrhoids: Secondary | ICD-10-CM | POA: Diagnosis not present

## 2021-05-03 DIAGNOSIS — E119 Type 2 diabetes mellitus without complications: Secondary | ICD-10-CM | POA: Diagnosis not present

## 2021-05-03 NOTE — Progress Notes (Signed)
Hematology/Oncology follow up note [facility] [phone] [phone]   Patient Care Team: [place], Wynelle Fanny, MD as PCP - General [name], [facility] as Pharmacist (Pharmacist)  REFERRING PROVIDER: Abner Greenspan, MD  CHIEF COMPLAINTS/REASON FOR VISIT:  Follow-up for iron deficiency anemia  HISTORY OF PRESENTING ILLNESS:  Martha Hamilton is a  70 y.o.  female with PMH listed below who was referred to me for evaluation of anemia Reviewed patient's recent labs that was done.  08/08/20 Labs revealed anemia with hemoglobin of 9.2, MCV 93 .   Reviewed patient's previous labs ordered by primary care physician's office, anemia is chronic onset , duration is since 2013, with baseline in 11s, worsened during the recent 6 months, after she stops taking oral iron supplementation.   Associated signs and symptoms: Patient reports fatigue. denies SOB with exertion.  Denies weight loss, easy bruising, hematochezia, hemoptysis, hematuria. Context: History of GI bleeding: deneis               History of gastric restriction surgery for GERD                Last colonoscopy: 2014. Last Endoscopy 2020.  Remote history of right breast cancer, s/p lumpectomy with node. She denies any previous chemotherapy or any anti estrogen therapy.     Family history positive for brother with esophageal cancer, mother with RCC.  I have recommended genetic testing.  She wanted to defer and if she changes her mind she will update me..   INTERVAL HISTORY Martha Hamilton is a 70 year old female with above history who presents for follow-up for iron deficiency anemia.  She has received intermittent IV Venofer last given on 11/02/2020.  She receives monthly B12 injections.   Reports a decline in her energy level for the past month including shortness of breath especially with exertion.  She is unable to tolerate oral iron secondary to abdominal discomfort and constipation.  She recently  had a colonoscopy/EGD with Dr. Henrene Pastor without any evidence of bleeding.  They are planning to have a capsule study done in the near future.  Review of Systems  Constitutional:  Positive for fatigue. Negative for appetite change, fever and unexpected weight change.  HENT:   Negative for nosebleeds, sore throat and trouble swallowing.   Eyes: Negative.   Respiratory: Negative.  Negative for cough, shortness of breath and wheezing.   Cardiovascular: Negative.  Negative for chest pain and leg swelling.  Gastrointestinal:  Negative for abdominal pain, blood in stool, constipation, diarrhea, nausea and vomiting.  Endocrine: Negative.   Genitourinary: Negative.  Negative for bladder incontinence, hematuria and nocturia.   Musculoskeletal: Negative.  Negative for back pain and flank pain.  Skin: Negative.   Neurological:  Positive for dizziness. Negative for headaches, light-headedness and numbness.  Hematological: Negative.   Psychiatric/Behavioral: Negative.  Negative for confusion. The patient is not nervous/anxious.     MEDICAL HISTORY:  Past Medical History:  Diagnosis Date   Allergic rhinitis    Breast cancer ([place]) 1996   right breast   DM2 (diabetes mellitus, type 2) (HCC)    GERD (gastroesophageal reflux disease)    Hx of cardiovascular stress test    a. Lex MV 2/14:  EF 69%, no ischemia   Hyperlipidemia    Hypertension    IDA (iron deficiency anemia) 08/27/2020   Iron deficiency anemia    Obesity    Osteoarthritis    hands   Ulcer     SURGICAL  HISTORY: Past Surgical History:  Procedure Laterality Date   APPENDECTOMY  1976   BREAST LUMPECTOMY Right 1996   right breast, lumpectomy w/nodes   CATARACT EXTRACTION W/PHACO Left 11/28/2020   Procedure: CATARACT EXTRACTION PHACO AND INTRAOCULAR LENS PLACEMENT ([place]) LEFT DIABETIC;  Surgeon: Leandrew Koyanagi, MD;  Location: [facility];  Service: Ophthalmology;  Laterality: Left;  5.02 01:07.4 7.4%   CATARACT EXTRACTION  W/PHACO Right 12/12/2020   Procedure: CATARACT EXTRACTION PHACO AND INTRAOCULAR LENS PLACEMENT ([facility]) RIGHT DIABETIC 5.22 00:59.2;  Surgeon: Leandrew Koyanagi, MD;  Location: [facility] [place];  Service: Ophthalmology;  Laterality: Right;   CHONDROPLASTY Left 02/10/2018   Procedure: CHONDROPLASTY;  Surgeon: Lovell Sheehan, MD;  Location: [facility];  Service: Orthopedics;  Laterality: Left;   GASTRIC RESTRICTION SURGERY     for reflux   HYSTEROSCOPY WITH D & C N/A 10/18/2012   Procedure: DILATATION AND CURETTAGE /HYSTEROSCOPY;  Surgeon: Mora Bellman, MD;  Location: [facility];  Service: Gynecology;  Laterality: N/A;   KNEE ARTHROSCOPY WITH MEDIAL MENISECTOMY Left 02/10/2018   Procedure: KNEE ARTHROSCOPY WITH MEDIAL and LATERAL MENISECTOMY;  Surgeon: Lovell Sheehan, MD;  Location: [facility];  Service: Orthopedics;  Laterality: Left;   POLYPECTOMY N/A 10/18/2012   Procedure: POLYPECTOMY;  Surgeon: Mora Bellman, MD;  Location: [facility];  Service: Gynecology;  Laterality: N/A;   SYNOVECTOMY Left 02/10/2018   Procedure: SYNOVECTOMY;  Surgeon: Lovell Sheehan, MD;  Location: [facility];  Service: Orthopedics;  Laterality: Left;    SOCIAL HISTORY: Social History   Socioeconomic History   Marital status: Single    Spouse name: Not on file   Number of children: 0   Years of education: Not on file   Highest education level: Not on file  Occupational History   Occupation: retired    Fish farm manager: RETIRED  Tobacco Use   Smoking status: Never   Smokeless tobacco: Never  Vaping Use   Vaping Use: Never used  Substance and Sexual Activity   Alcohol use: Never    Alcohol/week: 0.0 standard drinks   Drug use: No   Sexual activity: Not Currently    Birth control/protection: Post-menopausal  Other Topics Concern   Not on file  Social History Narrative   Lives with, and cares for her elderly mother.   Social Determinants of Health   Financial Resource Strain: Low Risk    Difficulty of Paying Living  Expenses: Not hard at all  Food Insecurity: No Food Insecurity   Worried About Charity fundraiser in the Last Year: Never true   [place] in the Last Year: Never true  Transportation Needs: No Transportation Needs   Lack of Transportation (Medical): No   Lack of Transportation (Non-Medical): No  Physical Activity: Insufficiently Active   Days of Exercise per Week: 7 days   Minutes of Exercise per Session: 20 min  Stress: No Stress Concern Present   Feeling of Stress : Not at all  Social Connections: Not on file  Intimate Partner Violence: Not At Risk   Fear of Current or Ex-Partner: No   Emotionally Abused: No   Physically Abused: No   Sexually Abused: No    FAMILY HISTORY: Family History  Problem Relation Age of Onset   Diabetes Mother    Heart disease Mother        Pacemaker, CHF   Hypertension Mother    Kidney cancer Mother    Stroke Father    CAD Father 41  Died with MI   Esophageal cancer Brother    Heart disease Sister    Colon cancer Neg Hx    Rectal cancer Neg Hx    Stomach cancer Neg Hx    Pancreatic cancer Neg Hx     ALLERGIES:  is allergic to lipitor [atorvastatin], niacin, questran [cholestyramine], and zocor [simvastatin].  MEDICATIONS:  Current Outpatient Medications  Medication Sig Dispense Refill   Accu-Chek Softclix Lancets lancets TEST BLOOD SUGAR EVERY DAY FOR DIABETES 100 each 3   acetaminophen (TYLENOL) 500 MG tablet Take 1,000 mg by mouth every 6 (six) hours as needed for mild pain.      Alcohol Swabs (B-D SINGLE USE SWABS REGULAR) PADS USE ONE TIME DAILY AS DIRECTED WHEN CHECKING BLOOD SUGAR 100 each 3   amitriptyline (ELAVIL) 25 MG tablet Take 1 tablet (25 mg total) by mouth at bedtime. 14 tablet 0   aspirin 81 MG tablet Take 81 mg by mouth at bedtime.      Blood Glucose Calibration (ACCU-CHEK AVIVA) SOLN USE TO CALIBRATE METER AS DIRECTED 1 each 2   Blood Glucose Monitoring Suppl (ACCU-CHEK AVIVA PLUS) w/Device KIT Use to check  blood sugar once daily for DM (dx. E11.9) 1 kit 0   calcium elemental as carbonate (BARIATRIC TUMS ULTRA) 400 MG chewable tablet Chew 1,000 mg by mouth daily as needed for heartburn.     Camphor-Menthol-Methyl Sal (SALONPAS) 3.07-19-08 % PTCH Apply topically.     Cholecalciferol (VITAMIN D) 2000 units tablet Take 2,000 Units by mouth daily.     fenofibrate (TRICOR) 145 MG tablet TAKE 1 TABLET EVERY DAY 90 tablet 3   glipiZIDE (GLUCOTROL XL) 2.5 MG 24 hr tablet TAKE 1 TABLET EVERY EVENING 90 tablet 3   glucose blood (ACCU-CHEK AVIVA PLUS) test strip TEST BLOOD SUGAR EVERY DAY FOR DIABETES (Dx. E11.9) 100 strip 1   loratadine (CLARITIN) 10 MG tablet Take 10 mg by mouth daily as needed for allergies.     losartan (COZAAR) 50 MG tablet TAKE 1 TABLET EVERY DAY 90 tablet 3   meclizine (ANTIVERT) 25 MG tablet Take 1 tablet (25 mg total) by mouth 3 (three) times daily as needed for dizziness. 90 tablet 3   metFORMIN (GLUCOPHAGE) 1000 MG tablet Take 0.5 tablets (500 mg total) by mouth 2 (two) times daily with a meal. TAKE 1/2 (569m) TABLET TWICE DAILY WITH A MEAL 1 tablet 0   omeprazole (PRILOSEC) 40 MG capsule TAKE 1 CAPSULE IN THE MORNING AND AT BEDTIME. 180 capsule 1   ondansetron (ZOFRAN) 4 MG tablet TAKE 1 TABLET EVERY 4 HOURS AS NEEDED FOR NAUSEA AND VOMITING 90 tablet 0   SALINE NASAL SPRAY NA Place 1 spray into the nose daily as needed (congestion).      No current facility-administered medications for this visit.     PHYSICAL EXAMINATION: ECOG PERFORMANCE STATUS: 1 - Symptomatic but completely ambulatory There were no vitals filed for this visit.  There were no vitals filed for this visit.   Physical Exam Constitutional:      Appearance: Normal appearance.  HENT:     Head: Normocephalic and atraumatic.  Eyes:     Pupils: Pupils are equal, round, and reactive to light.  Cardiovascular:     Rate and Rhythm: Normal rate and regular rhythm.     Heart sounds: Normal heart sounds. No  murmur heard. Pulmonary:     Effort: Pulmonary effort is normal.     Breath sounds: Normal breath sounds. No wheezing.  Abdominal:     General: Bowel sounds are normal. There is no distension.     Palpations: Abdomen is soft.     Tenderness: There is no abdominal tenderness.  Musculoskeletal:        General: Normal range of motion.     Cervical back: Normal range of motion.  Skin:    General: Skin is warm and dry.     Findings: No rash.  Neurological:     Mental Status: She is alert and oriented to person, place, and time.  Psychiatric:        Judgment: Judgment normal.     LABORATORY DATA:  I have reviewed the data as listed Lab Results  Component Value Date   WBC 8.7 04/29/2021   HGB 10.2 (L) 04/29/2021   HCT 31.8 (L) 04/29/2021   MCV 82.8 04/29/2021   PLT 491 (H) 04/29/2021   Recent Labs    07/19/20 0741 07/24/20 0840 08/08/20 0859 08/27/20 1611 11/08/20 0826  NA 136   < > 136 136 139  K 4.2   < > 4.5 4.8 4.5  CL 99   < > 102 103 103  CO2 28   < > 26 21* 27  GLUCOSE 111*   < > 89 108* 117*  BUN 30*   < > 19 26* 19  CREATININE 1.68*   < > 1.23* 1.36* 1.20  CALCIUM 9.7   < > 9.7 9.5 9.6  GFRNONAA  --   --   --  42*  --   PROT 7.5  --   --  7.9 7.6  ALBUMIN 4.2  --   --  3.8 4.1  AST 22  --   --  27 15  ALT 17  --   --  20 12  ALKPHOS 28*  --   --  35* 38*  BILITOT 0.3  --   --  0.4 0.2   < > = values in this interval not displayed.    Iron/TIBC/Ferritin/ %Sat    Component Value Date/Time   IRON 33 04/29/2021 1101   TIBC 528 (H) 04/29/2021 1101   FERRITIN 8 (L) 04/29/2021 1101   IRONPCTSAT 6 (L) 04/29/2021 1101        ASSESSMENT & PLAN:  No diagnosis found. Iron deficiency anemia-receives intermittent IV Venofer last given from 09/21/20-11/02/20.  Unknown etiology of her IDA.  Last EGD and colonoscopy was on 05/01/2021 and showed 1 5 mm polyp in sigmoid colon and diverticulosis.  She also had 1 internal hemorrhoid.  No evidence of bleeding.   Reported improvement of her energy after infusions.  Most recent labs from 04/29/2021 show ferritin of 8 with iron saturation of 6% and elevated TIBC.  Hemoglobin is 10.2 which is a decline from July.  Recommend 5 doses of IV Venofer, monthly B12 injection X 6.   B12 deficiency-Last B12 level was 163 about 3 months ago.  Continue monthly B12 injections.  She will need 1 today.  Disposition-proceed with IV Venofer today.  Return to clinic for 4 additional plus monthly B12 injections.  RTC in 3 months for labs and in 6 months for labs, see Dr. Tasia Catchings and possible IV Venofer.  I spent 25 minutes dedicated to the care of this patient (face-to-face and non-face-to-face) on the date of the encounter to include what is described in the assessment and plan.  Faythe Casa, NP 05/03/2021 1:40 PM

## 2021-05-03 NOTE — Telephone Encounter (Signed)
  Follow up Call-  Call back number 05/01/2021 02/08/2019  Post procedure Call Back phone  # [phone]  Permission to leave phone message Yes Yes  Some recent data might be hidden     Patient questions:  Do you have a fever, pain , or abdominal swelling? No. Pain Score  0 *  Have you tolerated food without any problems? Yes.    Have you been able to return to your normal activities? Yes.    Do you have any questions about your discharge instructions: Diet   No. Medications  No. Follow up visit  No.  Do you have questions or concerns about your Care? No.  Actions: * If pain score is 4 or above: No action needed, pain <4.  Have you developed a fever since your procedure? no  2.   Have you had an respiratory symptoms (SOB or cough) since your procedure? no  3.   Have you tested positive for COVID 19 since your procedure no  4.   Have you had any family members/close contacts diagnosed with the COVID 19 since your procedure?  no   If yes to any of these questions please route to Joylene John, RN and Joella Prince, RN

## 2021-05-05 ENCOUNTER — Encounter: Payer: Self-pay | Admitting: Internal Medicine

## 2021-05-06 ENCOUNTER — Telehealth: Payer: Self-pay

## 2021-05-06 ENCOUNTER — Other Ambulatory Visit: Payer: Self-pay

## 2021-05-06 ENCOUNTER — Inpatient Hospital Stay: Payer: Medicare HMO

## 2021-05-06 VITALS — BP 152/88 | HR 109 | Temp 98.8°F | Resp 20

## 2021-05-06 DIAGNOSIS — M1712 Unilateral primary osteoarthritis, left knee: Secondary | ICD-10-CM | POA: Diagnosis not present

## 2021-05-06 DIAGNOSIS — Z808 Family history of malignant neoplasm of other organs or systems: Secondary | ICD-10-CM | POA: Diagnosis not present

## 2021-05-06 DIAGNOSIS — Z8051 Family history of malignant neoplasm of kidney: Secondary | ICD-10-CM | POA: Diagnosis not present

## 2021-05-06 DIAGNOSIS — K648 Other hemorrhoids: Secondary | ICD-10-CM | POA: Diagnosis not present

## 2021-05-06 DIAGNOSIS — D509 Iron deficiency anemia, unspecified: Secondary | ICD-10-CM | POA: Diagnosis not present

## 2021-05-06 DIAGNOSIS — E119 Type 2 diabetes mellitus without complications: Secondary | ICD-10-CM | POA: Diagnosis not present

## 2021-05-06 DIAGNOSIS — I10 Essential (primary) hypertension: Secondary | ICD-10-CM | POA: Diagnosis not present

## 2021-05-06 DIAGNOSIS — D508 Other iron deficiency anemias: Secondary | ICD-10-CM

## 2021-05-06 DIAGNOSIS — E538 Deficiency of other specified B group vitamins: Secondary | ICD-10-CM | POA: Diagnosis not present

## 2021-05-06 MED ORDER — IRON SUCROSE 20 MG/ML IV SOLN
200.0000 mg | Freq: Once | INTRAVENOUS | Status: AC
  Administered 2021-05-06: 200 mg via INTRAVENOUS
  Filled 2021-05-06: qty 10

## 2021-05-06 MED ORDER — SODIUM CHLORIDE 0.9 % IV SOLN
Freq: Once | INTRAVENOUS | Status: AC
  Filled 2021-05-06: qty 250

## 2021-05-06 MED ORDER — SODIUM CHLORIDE 0.9 % IV SOLN: 200.0000 mg | Freq: Once | INTRAVENOUS | Status: DC

## 2021-05-06 NOTE — Patient Instructions (Signed)

## 2021-05-06 NOTE — Progress Notes (Signed)
Pt tolerated venofer infusion well today with no problems or complaints. Pt left infusion suite stable and ambulatory.  

## 2021-05-06 NOTE — Progress Notes (Addendum)
Chronic Care Management Pharmacy Assistant   Name: Martha Hamilton  MRN: [id number] DOB: [birth year]  Reason for Encounter: CCM (Hypertension Disease State)   Recent office visits:  None since last CCM contact  Recent consult visits:  05/03/2021 - Oncology - Patient presented for evaluation of anemia. Recommend 5 doses of IV Venofer, monthly B12 injection X 6.  05/01/2021 - [facility] - Patient presented for colonoscopy and upper endoscopy to evaluate iron deficiency anemia. 04/08/2021 - Oncology - Patient presented for cyanocobalamin ((VITAMIN B-12)) injection 1,000 mcg and Iron Sucrose (Venofer) 100-200MG Infusion.   Hospital visits:  None since last CCM contact  Medications: Outpatient Encounter Medications as of 05/06/2021  Medication Sig   Accu-Chek Softclix Lancets lancets TEST BLOOD SUGAR EVERY DAY FOR DIABETES   acetaminophen (TYLENOL) 500 MG tablet Take 1,000 mg by mouth every 6 (six) hours as needed for mild pain.    Alcohol Swabs (B-D SINGLE USE SWABS REGULAR) PADS USE ONE TIME DAILY AS DIRECTED WHEN CHECKING BLOOD SUGAR   amitriptyline (ELAVIL) 25 MG tablet Take 1 tablet (25 mg total) by mouth at bedtime.   aspirin 81 MG tablet Take 81 mg by mouth at bedtime.    Blood Glucose Calibration (ACCU-CHEK AVIVA) SOLN USE TO CALIBRATE METER AS DIRECTED   Blood Glucose Monitoring Suppl (ACCU-CHEK AVIVA PLUS) w/Device KIT Use to check blood sugar once daily for DM (dx. E11.9)   calcium elemental as carbonate (BARIATRIC TUMS ULTRA) 400 MG chewable tablet Chew 1,000 mg by mouth daily as needed for heartburn.   Camphor-Menthol-Methyl Sal (SALONPAS) 3.07-19-08 % PTCH Apply topically.   Cholecalciferol (VITAMIN D) 2000 units tablet Take 2,000 Units by mouth daily.   fenofibrate (TRICOR) 145 MG tablet TAKE 1 TABLET EVERY DAY   glipiZIDE (GLUCOTROL XL) 2.5 MG 24 hr tablet TAKE 1 TABLET EVERY EVENING   glucose blood (ACCU-CHEK AVIVA PLUS) test strip TEST BLOOD SUGAR EVERY DAY FOR  DIABETES (Dx. E11.9)   loratadine (CLARITIN) 10 MG tablet Take 10 mg by mouth daily as needed for allergies.   losartan (COZAAR) 50 MG tablet TAKE 1 TABLET EVERY DAY   meclizine (ANTIVERT) 25 MG tablet Take 1 tablet (25 mg total) by mouth 3 (three) times daily as needed for dizziness.   metFORMIN (GLUCOPHAGE) 1000 MG tablet Take 0.5 tablets (500 mg total) by mouth 2 (two) times daily with a meal. TAKE 1/2 (565m) TABLET TWICE DAILY WITH A MEAL   omeprazole (PRILOSEC) 40 MG capsule TAKE 1 CAPSULE IN THE MORNING AND AT BEDTIME.   ondansetron (ZOFRAN) 4 MG tablet TAKE 1 TABLET EVERY 4 HOURS AS NEEDED FOR NAUSEA AND VOMITING   SALINE NASAL SPRAY NA Place 1 spray into the nose daily as needed (congestion).    No facility-administered encounter medications on file as of 05/06/2021.   Recent Office Vitals: BP Readings from Last 3 Encounters:  05/03/21 (!) 184/98  05/01/21 130/64  01/31/21 (!) 186/86   Pulse Readings from Last 3 Encounters:  05/03/21 (!) 108  05/01/21 93  01/31/21 (!) 120    Wt Readings from Last 3 Encounters:  05/01/21 166 lb (75.3 kg)  01/31/21 166 lb 2 oz (75.4 kg)  12/12/20 164 lb (74.4 kg)    Kidney Function Lab Results  Component Value Date/Time   CREATININE 1.20 11/08/2020 08:26 AM   CREATININE 1.36 (H) 08/27/2020 04:11 PM   GFR 46.10 (L) 11/08/2020 08:26 AM   GFRNONAA 42 (L) 08/27/2020 04:11 PM   GFRAA >60 02/02/2018 10:56  AM   BMP Latest Ref Rng & Units 11/08/2020 08/27/2020 08/08/2020  Glucose 70 - 99 mg/dL 117(H) 108(H) 89  BUN 6 - 23 mg/dL 19 26(H) 19  Creatinine 0.40 - 1.20 mg/dL 1.20 1.36(H) 1.23(H)  Sodium 135 - 145 mEq/L 139 136 136  Potassium 3.5 - 5.1 mEq/L 4.5 4.8 4.5  Chloride 96 - 112 mEq/L 103 103 102  CO2 19 - 32 mEq/L 27 21(L) 26  Calcium 8.4 - 10.5 mg/dL 9.6 9.5 9.7   Contacted patient on 05/09/21 to discuss hypertension disease state  Current antihypertensive regimen:  Losartan 50 mg daily - take 1 tablet every day  Patient verbally  confirms she is taking the above medications as directed. Yes  How often are you checking your Blood Pressure? infrequently  she checks her blood pressure only if she is feeling lightheaded or nervous.  Patient states that happens 3x a week; she feels it is more due to being anemic.   Current home BP readings: Patient did not have them written down. I advised for her to start a log every time she checks her blood pressure and blood sugar. Patient understood and agreed.   Wrist or arm cuff: Wrist  Caffeine intake: Patient drinks decaf tea Salt intake: Does not add salt  OTC medications including pseudoephedrine or NSAIDs? Patient states she is taking an allergy pill every morning due to weather. Claritin 1 tablet a day - 822m.   Any readings above 180/120? No  What recent interventions/DTPs have been made by any provider to improve Blood Pressure control since last CPP Visit: No recent interventions.   Any recent hospitalizations or ED visits since last visit with CPP? No  What diet changes have been made to improve Blood Pressure Control?  Patient states she is not eating much; watches what she eats.   What exercise is being done to improve your Blood Pressure Control?  Patient has not been walking as much as she should; arthritis in her left knee is giving her problems and she just got an injection in it.   Adherence Review: Is the patient currently on ACE/ARB medication? Yes Does the patient have >5 day gap between last estimated fill dates? No  Star Rating Drugs:   Medication:  Last Fill: Day Supply Metformin 100422m10/07/2020 90 Glipizide 2.22m13m0/07/2020 90 Losartan 31m6m/07/2020 90 [facility] Gaps: Annual wellness visit in last year? Yes Most Recent BP reading: 184/98 on 05/03/2021  If Diabetic: Most recent A1C reading:  6.0 on 11/08/2020 Last eye exam / retinopathy screening: Up to date Last diabetic foot exam: Up to date  No  appointments scheduled within the next 30 days.  MichDebbora DusP notified  Seleta Hovland Marijean NiemannA [facility] Pharmacy Assistant [phone] reviewed the care management and care coordination activities outlined in this encounter and I am certifying that I agree with the content of this note. No further action required.  MichDebbora DusarmD Clinical Pharmacist [facility]-[phone]

## 2021-05-08 ENCOUNTER — Other Ambulatory Visit: Payer: Self-pay

## 2021-05-08 ENCOUNTER — Inpatient Hospital Stay: Payer: Medicare HMO

## 2021-05-08 VITALS — BP 176/82 | HR 103 | Temp 99.7°F | Resp 20

## 2021-05-08 DIAGNOSIS — Z8051 Family history of malignant neoplasm of kidney: Secondary | ICD-10-CM | POA: Diagnosis not present

## 2021-05-08 DIAGNOSIS — E119 Type 2 diabetes mellitus without complications: Secondary | ICD-10-CM | POA: Diagnosis not present

## 2021-05-08 DIAGNOSIS — D508 Other iron deficiency anemias: Secondary | ICD-10-CM

## 2021-05-08 DIAGNOSIS — I10 Essential (primary) hypertension: Secondary | ICD-10-CM | POA: Diagnosis not present

## 2021-05-08 DIAGNOSIS — E538 Deficiency of other specified B group vitamins: Secondary | ICD-10-CM | POA: Diagnosis not present

## 2021-05-08 DIAGNOSIS — D509 Iron deficiency anemia, unspecified: Secondary | ICD-10-CM | POA: Diagnosis not present

## 2021-05-08 DIAGNOSIS — K648 Other hemorrhoids: Secondary | ICD-10-CM | POA: Diagnosis not present

## 2021-05-08 DIAGNOSIS — Z808 Family history of malignant neoplasm of other organs or systems: Secondary | ICD-10-CM | POA: Diagnosis not present

## 2021-05-08 MED ORDER — IRON SUCROSE 20 MG/ML IV SOLN
200.0000 mg | Freq: Once | INTRAVENOUS | Status: AC
  Administered 2021-05-08: 200 mg via INTRAVENOUS
  Filled 2021-05-08: qty 10

## 2021-05-08 MED ORDER — SODIUM CHLORIDE 0.9 % IV SOLN: 200.0000 mg | Freq: Once | INTRAVENOUS | Status: DC

## 2021-05-08 MED ORDER — CYANOCOBALAMIN 1000 MCG/ML IJ SOLN: 1000.0000 ug | Freq: Once | INTRAMUSCULAR | Status: DC

## 2021-05-08 MED ORDER — SODIUM CHLORIDE 0.9 % IV SOLN
INTRAVENOUS | Status: DC
  Filled 2021-05-08: qty 250

## 2021-05-08 NOTE — Patient Instructions (Signed)

## 2021-05-09 ENCOUNTER — Other Ambulatory Visit: Payer: Self-pay

## 2021-05-09 ENCOUNTER — Ambulatory Visit
Admission: RE | Admit: 2021-05-09 | Discharge: 2021-05-09 | Disposition: A | Discharge status: Home / Self Care | Payer: Medicare HMO | Source: Ambulatory Visit | Attending: Family Medicine | Admitting: Family Medicine

## 2021-05-09 ENCOUNTER — Telehealth: Payer: Self-pay

## 2021-05-09 DIAGNOSIS — Z1231 Encounter for screening mammogram for malignant neoplasm of breast: Secondary | ICD-10-CM

## 2021-05-09 DIAGNOSIS — D508 Other iron deficiency anemias: Secondary | ICD-10-CM

## 2021-05-09 NOTE — Telephone Encounter (Signed)
Left message for pt to call back to schedule capsule endo.  Pt scheduled for capsule endo 05/21/21 at 8:30am. Pt aware of appt, instructions sent to pt via mychart. Amb ref in epic.

## 2021-05-13 ENCOUNTER — Inpatient Hospital Stay: Payer: Medicare HMO

## 2021-05-13 ENCOUNTER — Other Ambulatory Visit: Payer: Self-pay

## 2021-05-13 VITALS — BP 144/77 | HR 106 | Temp 97.9°F | Resp 18

## 2021-05-13 DIAGNOSIS — Z808 Family history of malignant neoplasm of other organs or systems: Secondary | ICD-10-CM | POA: Diagnosis not present

## 2021-05-13 DIAGNOSIS — Z8051 Family history of malignant neoplasm of kidney: Secondary | ICD-10-CM | POA: Diagnosis not present

## 2021-05-13 DIAGNOSIS — K648 Other hemorrhoids: Secondary | ICD-10-CM | POA: Diagnosis not present

## 2021-05-13 DIAGNOSIS — D508 Other iron deficiency anemias: Secondary | ICD-10-CM

## 2021-05-13 DIAGNOSIS — E538 Deficiency of other specified B group vitamins: Secondary | ICD-10-CM | POA: Diagnosis not present

## 2021-05-13 DIAGNOSIS — E119 Type 2 diabetes mellitus without complications: Secondary | ICD-10-CM | POA: Diagnosis not present

## 2021-05-13 DIAGNOSIS — I10 Essential (primary) hypertension: Secondary | ICD-10-CM | POA: Diagnosis not present

## 2021-05-13 DIAGNOSIS — D509 Iron deficiency anemia, unspecified: Secondary | ICD-10-CM | POA: Diagnosis not present

## 2021-05-13 MED ORDER — IRON SUCROSE 20 MG/ML IV SOLN
200.0000 mg | Freq: Once | INTRAVENOUS | Status: AC
  Administered 2021-05-13: 200 mg via INTRAVENOUS
  Filled 2021-05-13: qty 10

## 2021-05-13 MED ORDER — SODIUM CHLORIDE 0.9 % IV SOLN
INTRAVENOUS | Status: DC
  Filled 2021-05-13: qty 250

## 2021-05-13 MED ORDER — CYANOCOBALAMIN 1000 MCG/ML IJ SOLN
1000.0000 ug | Freq: Once | INTRAMUSCULAR | Status: AC
  Administered 2021-05-13: 1000 ug via INTRAMUSCULAR
  Filled 2021-05-13: qty 1

## 2021-05-13 MED ORDER — SODIUM CHLORIDE 0.9 % IV SOLN: 200.0000 mg | Freq: Once | INTRAVENOUS | Status: DC

## 2021-05-13 NOTE — Telephone Encounter (Signed)
Discussed with pt the prep instructions for capsule endo and all questions answered.

## 2021-05-13 NOTE — Telephone Encounter (Signed)
Patient called requesting to speak with nurse and go over her Capsule instructions to make she does it correctly.

## 2021-05-13 NOTE — Patient Instructions (Signed)
[facility] [place] [facility]  Discharge Instructions: Thank you for choosing [facility] to provide your oncology and hematology care.  If you have a lab appointment with the Cancer Center, please go directly to the Cancer Center and check in at the registration area.  Wear comfortable clothing and clothing appropriate for easy access to any Portacath or PICC line.   We strive to give you quality time with your provider. You may need to reschedule your appointment if you arrive late (15 or more minutes).  Arriving late affects you and other patients whose appointments are after yours.  Also, if you miss three or more appointments without notifying the office, you may be dismissed from the clinic at the provider's discretion.      For prescription refill requests, have your pharmacy contact our office and allow 72 hours for refills to be completed.    Today you received the following chemotherapy and/or immunotherapy agents VENOFER and Vitamin B12      To help prevent nausea and vomiting after your treatment, we encourage you to take your nausea medication as directed.  BELOW ARE SYMPTOMS THAT SHOULD BE REPORTED IMMEDIATELY: *FEVER GREATER THAN 100.4 F (38 C) OR HIGHER *CHILLS OR SWEATING *NAUSEA AND VOMITING THAT IS NOT CONTROLLED WITH YOUR NAUSEA MEDICATION *UNUSUAL SHORTNESS OF BREATH *UNUSUAL BRUISING OR BLEEDING *URINARY PROBLEMS (pain or burning when urinating, or frequent urination) *BOWEL PROBLEMS (unusual diarrhea, constipation, pain near the anus) TENDERNESS IN MOUTH AND THROAT WITH OR WITHOUT PRESENCE OF ULCERS (sore throat, sores in mouth, or a toothache) UNUSUAL RASH, SWELLING OR PAIN  UNUSUAL VAGINAL DISCHARGE OR ITCHING   Items with * indicate a potential emergency and should be followed up as soon as possible or go to the Emergency Department if any problems should occur.  Please show the CHEMOTHERAPY ALERT CARD or IMMUNOTHERAPY ALERT CARD  at check-in to the Emergency Department and triage nurse.  Should you have questions after your visit or need to cancel or reschedule your appointment, please contact [facility] [place] [facility]  [phone] and follow the prompts.  Office hours are 8:00 a.m. to 4:30 p.m. Monday - Friday. Please note that voicemails left after 4:00 p.m. may not be returned until the following business day.  We are closed weekends and major holidays. You have access to a nurse at all times for urgent questions. Please call the main number to the clinic [phone] and follow the prompts.  For any non-urgent questions, you may also contact your provider using MyChart. We now offer e-Visits for anyone 18 and older to request care online for non-urgent symptoms. For details visit [email or web address].   Also download the MyChart app! Go to the app store, search "MyChart", open the app, select [facility], and log in with your MyChart username and password.  Due to Covid, a mask is required upon entering the hospital/clinic. If you do not have a mask, one will be given to you upon arrival. For doctor visits, patients may have 1 support person aged 18 or older with them. For treatment visits, patients cannot have anyone with them due to current Covid guidelines and our immunocompromised population.   Iron Sucrose Injection What is this medication? IRON SUCROSE (EYE ern SOO krose) treats low levels of iron (iron deficiency anemia) in people with kidney disease. Iron is a mineral that plays an important role in making red blood cells, which carry oxygen from your lungs to the rest of your body.   This medicine may be used for other purposes; ask your health care provider or pharmacist if you have questions. COMMON BRAND NAME(S): Venofer What should I tell my care team before I take this medication? They need to know if you have any of these conditions: Anemia not caused by low iron levels Heart  disease High levels of iron in the blood Kidney disease Liver disease An unusual or allergic reaction to iron, other medications, foods, dyes, or preservatives Pregnant or trying to get pregnant Breast-feeding How should I use this medication? This medication is for infusion into a vein. It is given in a hospital or clinic setting. Talk to your care team about the use of this medication in children. While this medication may be prescribed for children as young as 2 years for selected conditions, precautions do apply. Overdosage: If you think you have taken too much of this medicine contact a poison control center or emergency room at once. NOTE: This medicine is only for you. Do not share this medicine with others. What if I miss a dose? It is important not to miss your dose. Call your care team if you are unable to keep an appointment. What may interact with this medication? Do not take this medication with any of the following: Deferoxamine Dimercaprol Other iron products This medication may also interact with the following: Chloramphenicol Deferasirox This list may not describe all possible interactions. Give your health care provider a list of all the medicines, herbs, non-prescription drugs, or dietary supplements you use. Also tell them if you smoke, drink alcohol, or use illegal drugs. Some items may interact with your medicine. What should I watch for while using this medication? Visit your care team regularly. Tell your care team if your symptoms do not start to get better or if they get worse. You may need blood work done while you are taking this medication. You may need to follow a special diet. Talk to your care team. Foods that contain iron include: whole grains/cereals, dried fruits, beans, or peas, leafy green vegetables, and organ meats (liver, kidney). What side effects may I notice from receiving this medication? Side effects that you should report to your care team as  soon as possible: Allergic reactions-skin rash, itching, hives, swelling of the face, lips, tongue, or throat Low blood pressure-dizziness, feeling faint or lightheaded, blurry vision Shortness of breath Side effects that usually do not require medical attention (report to your care team if they continue or are bothersome): Flushing Headache Joint pain Muscle pain Nausea Pain, redness, or irritation at injection site This list may not describe all possible side effects. Call your doctor for medical advice about side effects. You may report side effects to FDA at 1-800-FDA-1088. Where should I keep my medication? This medication is given in a hospital or clinic and will not be stored at home. NOTE: This sheet is a summary. It may not cover all possible information. If you have questions about this medicine, talk to your doctor, pharmacist, or health care provider.  2022 Elsevier/Gold Standard (2020-09-25 12:52:06)  Vitamin B12 Injection What is this medication? Vitamin B12 (VAHY tuh min B12) prevents and treats low vitamin B12 levels in your body. It is used in people who do not get enough vitamin B12 from their diet or when their digestive tract does not absorb enough. Vitamin B12 plays an important role in maintaining the health of your nervous system and red blood cells. This medicine may be used for other purposes;   ask your health care provider or pharmacist if you have questions. COMMON BRAND NAME(S): B-12 Compliance Kit, B-12 Injection Kit, Cyomin, Dodex, LA-12, Nutri-Twelve, Physicians EZ Use B-12, Primabalt What should I tell my care team before I take this medication? They need to know if you have any of these conditions: Kidney disease Leber's disease Megaloblastic anemia An unusual or allergic reaction to cyanocobalamin, cobalt, other medications, foods, dyes, or preservatives Pregnant or trying to get pregnant Breast-feeding How should I use this medication? This medication  is injected into a muscle or deeply under the skin. It is usually given in a clinic or care team's office. However, your care team may teach you how to inject yourself. Follow all instructions. Talk to your care team about the use of this medication in children. Special care may be needed. Overdosage: If you think you have taken too much of this medicine contact a poison control center or emergency room at once. NOTE: This medicine is only for you. Do not share this medicine with others. What if I miss a dose? If you are given your dose at a clinic or care team's office, call to reschedule your appointment. If you give your own injections, and you miss a dose, take it as soon as you can. If it is almost time for your next dose, take only that dose. Do not take double or extra doses. What may interact with this medication? Colchicine Heavy alcohol intake This list may not describe all possible interactions. Give your health care provider a list of all the medicines, herbs, non-prescription drugs, or dietary supplements you use. Also tell them if you smoke, drink alcohol, or use illegal drugs. Some items may interact with your medicine. What should I watch for while using this medication? Visit your care team regularly. You may need blood work done while you are taking this medication. You may need to follow a special diet. Talk to your care team. Limit your alcohol intake and avoid smoking to get the best benefit. What side effects may I notice from receiving this medication? Side effects that you should report to your care team as soon as possible: Allergic reactions-skin rash, itching, hives, swelling of the face, lips, tongue, or throat Swelling of the ankles, hands, or feet Trouble breathing Side effects that usually do not require medical attention (report to your care team if they continue or are bothersome): Diarrhea This list may not describe all possible side effects. Call your doctor for  medical advice about side effects. You may report side effects to FDA at 1-800-FDA-1088. Where should I keep my medication? Keep out of the reach of children. Store at room temperature between 15 and 30 degrees C (59 and 85 degrees F). Protect from light. Throw away any unused medication after the expiration date. NOTE: This sheet is a summary. It may not cover all possible information. If you have questions about this medicine, talk to your doctor, pharmacist, or health care provider.  2022 Elsevier/Gold Standard (2020-08-20 11:47:06)   

## 2021-05-15 ENCOUNTER — Other Ambulatory Visit: Payer: Self-pay

## 2021-05-15 ENCOUNTER — Inpatient Hospital Stay: Payer: Medicare HMO | Attending: Oncology

## 2021-05-15 VITALS — BP 172/68 | HR 102 | Temp 97.0°F | Resp 18

## 2021-05-15 DIAGNOSIS — E538 Deficiency of other specified B group vitamins: Secondary | ICD-10-CM | POA: Diagnosis not present

## 2021-05-15 DIAGNOSIS — D509 Iron deficiency anemia, unspecified: Secondary | ICD-10-CM | POA: Diagnosis not present

## 2021-05-15 DIAGNOSIS — D508 Other iron deficiency anemias: Secondary | ICD-10-CM

## 2021-05-15 MED ORDER — IRON SUCROSE 20 MG/ML IV SOLN
200.0000 mg | Freq: Once | INTRAVENOUS | Status: AC
  Administered 2021-05-15: 200 mg via INTRAVENOUS
  Filled 2021-05-15: qty 10

## 2021-05-15 MED ORDER — SODIUM CHLORIDE 0.9 % IV SOLN: 200.0000 mg | Freq: Once | INTRAVENOUS | Status: DC

## 2021-05-15 MED ORDER — SODIUM CHLORIDE 0.9 % IV SOLN
INTRAVENOUS | Status: DC
  Filled 2021-05-15: qty 250

## 2021-05-17 ENCOUNTER — Other Ambulatory Visit: Payer: Self-pay

## 2021-05-17 ENCOUNTER — Inpatient Hospital Stay: Payer: Medicare HMO

## 2021-05-17 VITALS — BP 168/85 | HR 108 | Temp 99.2°F | Resp 20

## 2021-05-17 DIAGNOSIS — D509 Iron deficiency anemia, unspecified: Secondary | ICD-10-CM | POA: Diagnosis not present

## 2021-05-17 DIAGNOSIS — D508 Other iron deficiency anemias: Secondary | ICD-10-CM

## 2021-05-17 DIAGNOSIS — E538 Deficiency of other specified B group vitamins: Secondary | ICD-10-CM | POA: Diagnosis not present

## 2021-05-17 MED ORDER — IRON SUCROSE 20 MG/ML IV SOLN
200.0000 mg | Freq: Once | INTRAVENOUS | Status: AC
  Administered 2021-05-17: 200 mg via INTRAVENOUS
  Filled 2021-05-17: qty 10

## 2021-05-17 MED ORDER — SODIUM CHLORIDE 0.9 % IV SOLN
INTRAVENOUS | Status: DC
  Filled 2021-05-17: qty 250

## 2021-05-17 MED ORDER — SODIUM CHLORIDE 0.9 % IV SOLN: 200.0000 mg | Freq: Once | INTRAVENOUS | Status: DC

## 2021-05-17 NOTE — Patient Instructions (Signed)

## 2021-05-21 ENCOUNTER — Ambulatory Visit (INDEPENDENT_AMBULATORY_CARE_PROVIDER_SITE_OTHER): Payer: Medicare HMO | Admitting: Internal Medicine

## 2021-05-21 ENCOUNTER — Encounter: Payer: Self-pay | Admitting: Internal Medicine

## 2021-05-21 DIAGNOSIS — D508 Other iron deficiency anemias: Secondary | ICD-10-CM

## 2021-05-21 DIAGNOSIS — D500 Iron deficiency anemia secondary to blood loss (chronic): Secondary | ICD-10-CM

## 2021-05-21 NOTE — Patient Instructions (Signed)

## 2021-05-21 NOTE — Progress Notes (Signed)
SN: [id number] Exp: 08/23/2022 LOT: [id number] Patient arrived for Capsule Endoscopy. Reported the prep went well. This nurse explained dietary restrictions for the next few hours. Patient verbalized understanding. Opened capsule, ensured capsule was flashing prior to the patient swallowing the capsule. Patient swallowed capsule without difficulty. Patient instructed to return to the office at 4:00 pm today for removal of the recording equipment, to call the office with any questions and if no capsule was visualized after 72 hours. No further questions by the conclusion of the visit.

## 2021-05-28 ENCOUNTER — Telehealth: Payer: Self-pay

## 2021-05-28 NOTE — Progress Notes (Addendum)
Chronic Care Management Pharmacy Assistant   Name: Martha Hamilton  MRN: [id number] DOB: 1951/04/14  Reason for Encounter: CCM (Hypertension Disease State)   Recent office visits:  None since last CCM contact  Recent consult visits:  05/21/2021 - Gastroenterology - Patient presented Capsule Endoscopy.   05/17/2021 - Oncology - Patient presented for infusion for iron deficiency anemia. Administered: iron sucrose (VENOFER) injection 200 mg and 0.9 % sodium chloride infusion. 05/15/2021 - Oncology - Patient presented for infusion for iron deficiency anemia. Administered: iron sucrose (VENOFER) injection 200 mg and 0.9 % sodium chloride infusion. 05/13/2021 - Oncology - Patient presented for infusion for iron deficiency anemia. Administered: iron sucrose (VENOFER) injection 200 mg and 0.9 % sodium chloride infusion and cyanocobalamin ((VITAMIN B-12)) injection 1,000 mcg. 05/08/2021 - Oncology - Patient presented for infusion for iron deficiency anemia. Administered: iron sucrose (VENOFER) injection 200 mg and 0.9 % sodium chloride infusion. 05/06/2021 - Oncology - Patient presented for infusion for iron deficiency anemia. Administered: iron sucrose (VENOFER) injection 200 mg and 0.9 % sodium chloride infusion.  Hospital visits:  None since last CCM contact  Medications: Outpatient Encounter Medications as of 05/28/2021  Medication Sig   Accu-Chek Softclix Lancets lancets TEST BLOOD SUGAR EVERY DAY FOR DIABETES   acetaminophen (TYLENOL) 500 MG tablet Take 1,000 mg by mouth every 6 (six) hours as needed for mild pain.    Alcohol Swabs (B-D SINGLE USE SWABS REGULAR) PADS USE ONE TIME DAILY AS DIRECTED WHEN CHECKING BLOOD SUGAR   amitriptyline (ELAVIL) 25 MG tablet Take 1 tablet (25 mg total) by mouth at bedtime.   aspirin 81 MG tablet Take 81 mg by mouth at bedtime.    Blood Glucose Calibration (ACCU-CHEK AVIVA) SOLN USE TO CALIBRATE METER AS DIRECTED   Blood Glucose Monitoring Suppl  (ACCU-CHEK AVIVA PLUS) w/Device KIT Use to check blood sugar once daily for DM (dx. E11.9)   calcium elemental as carbonate (BARIATRIC TUMS ULTRA) 400 MG chewable tablet Chew 1,000 mg by mouth daily as needed for heartburn.   Camphor-Menthol-Methyl Sal (SALONPAS) 3.07-19-08 % PTCH Apply topically.   Cholecalciferol (VITAMIN D) 2000 units tablet Take 2,000 Units by mouth daily.   fenofibrate (TRICOR) 145 MG tablet TAKE 1 TABLET EVERY DAY   glipiZIDE (GLUCOTROL XL) 2.5 MG 24 hr tablet TAKE 1 TABLET EVERY EVENING   glucose blood (ACCU-CHEK AVIVA PLUS) test strip TEST BLOOD SUGAR EVERY DAY FOR DIABETES (Dx. E11.9)   loratadine (CLARITIN) 10 MG tablet Take 10 mg by mouth daily as needed for allergies.   losartan (COZAAR) 50 MG tablet TAKE 1 TABLET EVERY DAY   meclizine (ANTIVERT) 25 MG tablet Take 1 tablet (25 mg total) by mouth 3 (three) times daily as needed for dizziness.   metFORMIN (GLUCOPHAGE) 1000 MG tablet Take 0.5 tablets (500 mg total) by mouth 2 (two) times daily with a meal. TAKE 1/2 (568m) TABLET TWICE DAILY WITH A MEAL   omeprazole (PRILOSEC) 40 MG capsule TAKE 1 CAPSULE IN THE MORNING AND AT BEDTIME.   ondansetron (ZOFRAN) 4 MG tablet TAKE 1 TABLET EVERY 4 HOURS AS NEEDED FOR NAUSEA AND VOMITING   SALINE NASAL SPRAY NA Place 1 spray into the nose daily as needed (congestion).    No facility-administered encounter medications on file as of 05/28/2021.   Recent Office Vitals: BP Readings from Last 3 Encounters:  05/17/21 (!) 168/85  05/15/21 (!) 172/68  05/13/21 (!) 144/77   Pulse Readings from Last 3 Encounters:  05/17/21 (!) 108  05/15/21 (!) 102  05/13/21 (!) 106    Wt Readings from Last 3 Encounters:  05/01/21 166 lb (75.3 kg)  01/31/21 166 lb 2 oz (75.4 kg)  12/12/20 164 lb (74.4 kg)    Kidney Function Lab Results  Component Value Date/Time   CREATININE 1.20 11/08/2020 08:26 AM   CREATININE 1.36 (H) 08/27/2020 04:11 PM   GFR 46.10 (L) 11/08/2020 08:26 AM   GFRNONAA  42 (L) 08/27/2020 04:11 PM   GFRAA >60 02/02/2018 10:56 AM   BMP Latest Ref Rng & Units 11/08/2020 08/27/2020 08/08/2020  Glucose 70 - 99 mg/dL 117(H) 108(H) 89  BUN 6 - 23 mg/dL 19 26(H) 19  Creatinine 0.40 - 1.20 mg/dL 1.20 1.36(H) 1.23(H)  Sodium 135 - 145 mEq/L 139 136 136  Potassium 3.5 - 5.1 mEq/L 4.5 4.8 4.5  Chloride 96 - 112 mEq/L 103 103 102  CO2 19 - 32 mEq/L 27 21(L) 26  Calcium 8.4 - 10.5 mg/dL 9.6 9.5 9.7   Contacted patient on 05/28/2021 to discuss hypertension disease state  Current antihypertensive regimen:  Losartan 50 mg daily - take 1 tablet every day  Patient verbally confirms she is taking the above medications as directed. Yes  How often are you checking your Blood Pressure? daily  she checks her blood pressure in the afternoon after taking her medication.  Current home BP readings:   DATE:             BP                PULSE 11/15   114/63  106 11/14   122/68  101 11/13   132/71  112 11/12   112/69  102 11/11   132/67  104 11/10   121/67  102 11/09   104/59  100 11/08   123/67  104 11/07   123/69  102 11/06   118/61  112 11/05   123/70  110 11/04   124/68  105 11/03   115/62  106  Wrist or arm cuff: Wrist Caffeine intake:Drinks Decaf Tea Salt intake: Does not add salt OTC medications including pseudoephedrine or NSAIDs? Patient takes 1 Claritin tablet a day.   Any readings above 180/120? No  What recent interventions/DTPs have been made by any provider to improve Blood Pressure control since last CPP Visit: No recent interventions.   Any recent hospitalizations or ED visits since last visit with CPP? No  What diet changes have been made to improve Blood Pressure Control?  Patient watches what she eats.   What exercise is being done to improve your Blood Pressure Control?  Due to left knee pain patient is unable to do much.   Adherence Review: Is the patient currently on ACE/ARB medication? Yes Does the patient have >5 day gap between  last estimated fill dates? No  Star Rating Drugs:  Medication:  Last Fill: Day Supply Metformin 1062m      04/13/2021      90 Glipizide 2.520m          04/13/2021      90 Losartan 5032m          04/13/2021      90  Care Gaps: Annual wellness visit in last year? Yes Most Recent BP reading: 184/98 on 05/03/2021  If Diabetic: Most recent A1C reading: 6.0 on 11/08/2020 Last eye exam / retinopathy screening:Up to date Last diabetic foot exam: Up to date  No appointments scheduled within the next  30 days.  [name], CPP notified  Marijean Niemann, [place] Clinical Pharmacy Assistant [phone]  I have reviewed the care management and care coordination activities outlined in this encounter and I am certifying that I agree with the content of this note. Updated chart - see note 06/11/21.  [name], PharmD Clinical Pharmacist [facility] at [facility] [phone]

## 2021-06-03 ENCOUNTER — Inpatient Hospital Stay: Payer: Medicare HMO

## 2021-06-03 ENCOUNTER — Other Ambulatory Visit: Payer: Self-pay

## 2021-06-03 DIAGNOSIS — D508 Other iron deficiency anemias: Secondary | ICD-10-CM

## 2021-06-03 DIAGNOSIS — E538 Deficiency of other specified B group vitamins: Secondary | ICD-10-CM | POA: Diagnosis not present

## 2021-06-03 DIAGNOSIS — D509 Iron deficiency anemia, unspecified: Secondary | ICD-10-CM | POA: Diagnosis not present

## 2021-06-03 MED ORDER — CYANOCOBALAMIN 1000 MCG/ML IJ SOLN
1000.0000 ug | Freq: Once | INTRAMUSCULAR | Status: AC
  Administered 2021-06-03: 1000 ug via INTRAMUSCULAR
  Filled 2021-06-03: qty 1

## 2021-06-10 ENCOUNTER — Other Ambulatory Visit: Payer: Self-pay | Admitting: Family Medicine

## 2021-06-11 ENCOUNTER — Ambulatory Visit: Payer: Self-pay

## 2021-06-11 DIAGNOSIS — E1169 Type 2 diabetes mellitus with other specified complication: Secondary | ICD-10-CM

## 2021-06-11 DIAGNOSIS — I10 Essential (primary) hypertension: Secondary | ICD-10-CM

## 2021-06-11 NOTE — Patient Instructions (Signed)
Patient Care Plan: CCM Pharmacy Care Plan     Problem Identified: CHL AMB "PATIENT-SPECIFIC PROBLEM"      Long-Range Goal: [place]   Start Date: 12/24/2020  Priority: High  Note:     Current Barriers:  None identified   Pharmacist Clinical Goal(s):  Patient will contact provider office for questions/concerns as evidenced notation of same in electronic health record through collaboration with PharmD and provider.   Interventions: 1:1 collaboration with Tower, Wynelle Fanny, MD regarding development and update of comprehensive plan of care as evidenced by provider attestation and co-signature Inter-disciplinary care team collaboration (see longitudinal plan of care) Comprehensive medication review performed; medication list updated in electronic medical record  Hypertension (BP goal <140/90) -Controlled - home BP remains within goal despite elevated clinic readings 06/11/21 -Current treatment: Losartan 50 mg daily -Medications previously tried: HCTZ- discontinued due to kidney function  -Current home readings:    DATE:              BP                   PULSE 11/15                           114/63             106 11/14                           122/68             101 11/13                           132/71             112 11/12                           112/69             102 11/11                           132/67             104 11/10                           121/67             102 11/09                           104/59             100 11/08                           123/67             104 11/07                           123/69             102 11/06                           118/61  112 11/05                           123/70             110 11/04                           124/68             105 11/03                           115/62             106   Wrist or arm cuff: Wrist Caffeine intake:Drinks Decaf Tea OTC medications including pseudoephedrine or  NSAIDs? None -Follows low salt diet, uses Ms. Dash for seasoning. Uses pepper instead of salt.  -Denies hypotensive/hypertensive symptoms -Educated on BP goals and benefits of medications for prevention of heart attack, stroke and kidney damage; -Counseled to monitor BP at home at least once monthly, document, and provide log at future appointments -Recommended to continue current medication; Recommend bringing wrist cuff in to check accuracy.  Hyperlipidemia: (LDL goal < 100) -Not ideally controlled - LDL 108 TG 497 (01/22) -She reports high TG run in her family -Current treatment: Fenofibrate 145 mg daily Aspirin 81 mg - 1 tablet daily in the evening  -Medications previously tried: Per PCP note, statin intolerant. Patient affirms she took statin for a while but pain (muscle cramps, etc.) became very severe. States she has tried 4 different kinds. Fibrate dose was increased 7/21 due to high TG. -Denies bleeding on aspirin. She uses Tylenol as needed, avoid NSAIDs.  -Educated on Cholesterol goals;  Patient states she would like to find a medication she could tolerate to get TG down. We discussed the potential to add Zetia  ; She will discuss with PCP if desired. At this time as TG < 500 and LDL only mildly elevated at 108, I would continue current therapy unless patient desires more strict control. -Recommended to continue current medication  Diabetes (A1c goal <7%) -Controlled - A1c 6% -Current medications: Glipizide 2.5 mg - 1 every evening  Metformin 1000 mg - 1/2 (500 mg) twice daily -Medications previously tried: metformin dose reduced due to kidney function  -Confirms adherence to above medications as prescribed -Current home glucose readings - checks daily (rotates between fasting and 2 hours after supper) fasting glucose: 103 (this morning), she reports it usually runs low 100s (lowest - 74, 80) post prandial glucose: 125-135 -Denies hypoglycemic/hyperglycemic symptoms -Current  diet: 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  -[place] -Educated on Counseling to keep track of low BG or symptoms and let us know; We might could stop glipizide given BG control. -Counseled to check feet daily and get yearly eye exams - up to date  -Recommended to continue current medication  GERD (Goal: Control acid reflux) -Controlled - per patient report  -Followed by GI, Dr. Scarlette Shorts -Current treatment  Omeprazole 40 mg - 1 twice daily Tums - takes as needed for breakthrough symptoms  -Medications previously tried: none reported -She cooks foods with minimal spice. She has to raise head of bed and limits food before bed.  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 No additional meds needed  Reports amitriptyline helps her relax and go to sleep. She sleeps very well and wakes up rested.  She reports some fatigue during the day but thinks related to the anemia. Does not seem to be groggy.  Patient Goals/Self-Care Activities Patient will:  - take medications as prescribed  Follow Up Plan: Telephone follow up appointment with care management team member scheduled for:  12 months    Martha Hamilton, PharmD Clinical Pharmacist [facility] at [facility] [phone]

## 2021-06-11 NOTE — Telephone Encounter (Addendum)
Amy, would you mind asking patient if her blood pressure monitor has been calibrated or checked for accuracy recently? If not, please ask her to bring it to next visit and we can check it for her.  Thank you!  [name], PharmD Clinical Pharmacist [place] [facility] at [facility] [phone]

## 2021-06-11 NOTE — Progress Notes (Signed)
Updated care plan based on patient's home BP readings reported to CCM team. Recommend pt bring in wrist cuff to next visit to verify accuracy.  Patient Care Plan: CCM Pharmacy Care Plan     Problem Identified: CHL AMB "PATIENT-SPECIFIC PROBLEM"      Long-Range Goal: [place]   Start Date: 12/24/2020  Priority: High  Note:     Current Barriers:  None identified   Pharmacist Clinical Goal(s):  Patient will contact provider office for questions/concerns as evidenced notation of same in electronic health record through collaboration with PharmD and provider.   Interventions: 1:1 collaboration with Tower, Wynelle Fanny, MD regarding development and update of comprehensive plan of care as evidenced by provider attestation and co-signature Inter-disciplinary care team collaboration (see longitudinal plan of care) Comprehensive medication review performed; medication list updated in electronic medical record  Hypertension (BP goal <140/90) -Controlled - home BP remains within goal despite elevated clinic readings 06/11/21 -Current treatment: Losartan 50 mg daily -Medications previously tried: HCTZ- discontinued due to kidney function  -Current home readings:    DATE:              BP                   PULSE 11/15                           114/63             106 11/14                           122/68             101 11/13                           132/71             112 11/12                           112/69             102 11/11                           132/67             104 11/10                           121/67             102 11/09                           104/59             100 11/08                           123/67             104 11/07                           123/69             102 11/06  118/61             112 11/05                           123/70             110 11/04                           124/68             105 11/03                            115/62             106   Wrist or arm cuff: Wrist Caffeine intake:Drinks Decaf Tea OTC medications including pseudoephedrine or NSAIDs? None -Follows low salt diet, uses Ms. Dash for seasoning. Uses pepper instead of salt.  -Denies hypotensive/hypertensive symptoms -Educated on BP goals and benefits of medications for prevention of heart attack, stroke and kidney damage; -Counseled to monitor BP at home at least once monthly, document, and provide log at future appointments -Recommended to continue current medication; Recommend bringing wrist cuff in to check accuracy.  Hyperlipidemia: (LDL goal < 100) -Not ideally controlled - LDL 108 TG 497 (01/22) -She reports high TG run in her family -Current treatment: Fenofibrate 145 mg daily Aspirin 81 mg - 1 tablet daily in the evening  -Medications previously tried: Per PCP note, statin intolerant. Patient affirms she took statin for a while but pain (muscle cramps, etc.) became very severe. States she has tried 4 different kinds. Fibrate dose was increased 7/21 due to high TG. -Denies bleeding on aspirin. She uses Tylenol as needed, avoid NSAIDs.  -Educated on Cholesterol goals;  Patient states she would like to find a medication she could tolerate to get TG down. We discussed the potential to add Zetia  ; She will discuss with PCP if desired. At this time as TG < 500 and LDL only mildly elevated at 108, I would continue current therapy unless patient desires more strict control. -Recommended to continue current medication  Diabetes (A1c goal <7%) -Controlled - A1c 6% -Current medications: Glipizide 2.5 mg - 1 every evening  Metformin 1000 mg - 1/2 (500 mg) twice daily -Medications previously tried: metformin dose reduced due to kidney function  -Confirms adherence to above medications as prescribed -Current home glucose readings - checks daily (rotates between fasting and 2 hours after supper) fasting glucose: 103 (this morning),  she reports it usually runs low 100s (lowest - 74, 80) post prandial glucose: 125-135 -Denies hypoglycemic/hyperglycemic symptoms -Current diet: 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  -Current exercise: [place], keeps her garden -Educated on Counseling to keep track of low BG or symptoms and let us know; We might could stop glipizide given BG control. -Counseled to check feet daily and get yearly eye exams - up to date  -Recommended to continue current medication  GERD (Goal: Control acid reflux) -Controlled - per patient report  -Followed by GI, Dr. Scarlette Shorts -Current treatment  Omeprazole 40 mg - 1 twice daily Tums - takes as needed for breakthrough symptoms  -Medications previously tried: none reported -She cooks foods with minimal spice. She has to raise head of bed and limits food before bed.  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 No additional meds needed  Reports amitriptyline helps her relax and go to sleep. She sleeps very well and wakes up rested.  She reports some fatigue during the day but thinks related to the anemia. Does not seem to be groggy.  Patient Goals/Self-Care Activities Patient will:  - take medications as prescribed  Follow Up Plan: Telephone follow up appointment with care management team member scheduled for:  12 months      [name], PharmD Clinical Pharmacist [facility] at [facility] [phone]

## 2021-06-12 ENCOUNTER — Telehealth: Payer: Self-pay

## 2021-06-12 NOTE — Telephone Encounter (Signed)
-----   Message from Irene Shipper, MD sent at 06/12/2021 10:11 AM EST ----- Regarding: RE: Capsule Please let the patient know that her capsule endoscopy did not show any abnormalities.  This is good.  My recommendation is to continue seeing hematology to monitor her blood counts and make sure that she gets adequate iron replacement.  Thanks Dr. Henrene Pastor ----- Message ----- From: Algernon Huxley, RN Sent: 06/12/2021   9:25 AM EST To: Irene Shipper, MD Subject: FW: Capsule                                    Pt calling for capsule endo results, not sure if this has been read yet or not. Please advise. ----- Message ----- From: Algernon Huxley, RN Sent: 06/12/2021  12:00 AM EST To: Algernon Huxley, RN Subject: Capsule                                        Capsule results

## 2021-06-12 NOTE — Telephone Encounter (Signed)
Spoke with pt and she is aware of results and recommendations per Dr. Perry. 

## 2021-06-12 NOTE — Progress Notes (Signed)
Called patient to inquire if her blood pressure monitor has been calibrated for accuracy. Patient did not answer. Left a message.   Martha Hamilton, CPP notified  Marijean Niemann, [place] Clinical Pharmacy Assistant [phone]  Time Spent:  2 Minutes

## 2021-06-12 NOTE — Progress Notes (Signed)
Patient returned my call. Her blood pressure monitor has not been calibrated recently. Patient will bring it to the office on 06/20/2021 when she goes for her Flu shot.  [name], CPP notified  Marijean Niemann, [place] Clinical Pharmacy Assistant [phone]  Time Spent:  3 Minutes

## 2021-06-20 ENCOUNTER — Ambulatory Visit: Payer: Medicare HMO

## 2021-06-22 ENCOUNTER — Other Ambulatory Visit: Payer: Self-pay | Admitting: Family Medicine

## 2021-06-25 NOTE — Telephone Encounter (Signed)
Called and lvmtcb to schedule apt

## 2021-06-25 NOTE — Telephone Encounter (Signed)
Pt is due for a CPE (part 2) or a f/u next month (pt's choice) but we have to see her for an appt sometime next month. Please schedule appt and then route back to me to refill meds

## 2021-06-26 NOTE — Telephone Encounter (Signed)
Pt scheduled a lab/cpe on 12.23.22

## 2021-07-01 ENCOUNTER — Telehealth: Payer: Self-pay | Admitting: Oncology

## 2021-07-01 ENCOUNTER — Inpatient Hospital Stay: Payer: Medicare HMO

## 2021-07-01 NOTE — Telephone Encounter (Signed)
Pt called to cancel her appt for today. She has Covid. Please call to reschedule at [phone]

## 2021-07-05 ENCOUNTER — Encounter: Payer: Medicare HMO | Admitting: Family Medicine

## 2021-07-14 ENCOUNTER — Other Ambulatory Visit: Payer: Self-pay | Admitting: Family Medicine

## 2021-07-16 NOTE — Telephone Encounter (Signed)
Pt is overdue for her part 2 CPE with PCP, please schedule when able and then route back to me to refill strips

## 2021-07-17 NOTE — Telephone Encounter (Signed)
Pt scheduled a lab/cpe in march 2023

## 2021-07-29 ENCOUNTER — Encounter: Payer: Self-pay | Admitting: Oncology

## 2021-07-30 ENCOUNTER — Other Ambulatory Visit: Payer: Self-pay | Admitting: *Deleted

## 2021-07-30 DIAGNOSIS — D508 Other iron deficiency anemias: Secondary | ICD-10-CM

## 2021-07-30 DIAGNOSIS — E538 Deficiency of other specified B group vitamins: Secondary | ICD-10-CM

## 2021-08-02 ENCOUNTER — Inpatient Hospital Stay: Payer: Medicare PPO

## 2021-08-05 ENCOUNTER — Inpatient Hospital Stay: Payer: Medicare PPO

## 2021-08-05 ENCOUNTER — Other Ambulatory Visit: Payer: Self-pay

## 2021-08-05 ENCOUNTER — Inpatient Hospital Stay: Payer: Medicare PPO | Attending: Oncology | Admitting: Oncology

## 2021-08-05 ENCOUNTER — Encounter: Payer: Self-pay | Admitting: Oncology

## 2021-08-05 VITALS — BP 160/89 | HR 101 | Temp 99.0°F | Resp 16 | Wt 164.5 lb

## 2021-08-05 DIAGNOSIS — D508 Other iron deficiency anemias: Secondary | ICD-10-CM | POA: Diagnosis not present

## 2021-08-05 DIAGNOSIS — E538 Deficiency of other specified B group vitamins: Secondary | ICD-10-CM | POA: Insufficient documentation

## 2021-08-05 DIAGNOSIS — D509 Iron deficiency anemia, unspecified: Secondary | ICD-10-CM | POA: Insufficient documentation

## 2021-08-05 LAB — CBC WITH DIFFERENTIAL/PLATELET
Abs Immature Granulocytes: 0.05 10*3/uL (ref 0.00–0.07)
Basophils Absolute: 0.1 10*3/uL (ref 0.0–0.1)
Basophils Relative: 1 %
Eosinophils Absolute: 0.4 10*3/uL (ref 0.0–0.5)
Eosinophils Relative: 5 %
HCT: 39.3 % (ref 36.0–46.0)
Hemoglobin: 13.2 g/dL (ref 12.0–15.0)
Immature Granulocytes: 1 %
Lymphocytes Relative: 26 %
Lymphs Abs: 2.2 10*3/uL (ref 0.7–4.0)
MCH: 28.8 pg (ref 26.0–34.0)
MCHC: 33.6 g/dL (ref 30.0–36.0)
MCV: 85.8 fL (ref 80.0–100.0)
Monocytes Absolute: 0.7 10*3/uL (ref 0.1–1.0)
Monocytes Relative: 8 %
Neutro Abs: 5 10*3/uL (ref 1.7–7.7)
Neutrophils Relative %: 59 %
Platelets: 407 10*3/uL — ABNORMAL HIGH (ref 150–400)
RBC: 4.58 MIL/uL (ref 3.87–5.11)
RDW: 14.6 % (ref 11.5–15.5)
WBC: 8.4 10*3/uL (ref 4.0–10.5)
nRBC: 0 % (ref 0.0–0.2)

## 2021-08-05 LAB — IRON AND TIBC
Iron: 72 ug/dL (ref 28–170)
Saturation Ratios: 16 % (ref 10.4–31.8)
TIBC: 438 ug/dL (ref 250–450)
UIBC: 366 ug/dL

## 2021-08-05 LAB — FERRITIN: Ferritin: 89 ng/mL (ref 11–307)

## 2021-08-05 LAB — VITAMIN B12: Vitamin B-12: 119 pg/mL — ABNORMAL LOW (ref 180–914)

## 2021-08-05 MED ORDER — CYANOCOBALAMIN 1000 MCG/ML IJ SOLN
1000.0000 ug | Freq: Once | INTRAMUSCULAR | Status: AC
  Administered 2021-08-05: 1000 ug via INTRAMUSCULAR
  Filled 2021-08-05: qty 1

## 2021-08-05 NOTE — Progress Notes (Signed)
Hematology/Oncology follow up note [phone] [phone]   Patient Care Team: Tower, Martha Fanny, MD as PCP - General [name], [facility] as Pharmacist (Pharmacist)  REFERRING PROVIDER: Abner Greenspan, MD  CHIEF COMPLAINTS/REASON FOR VISIT:  Follow-up for iron deficiency anemia  HISTORY OF PRESENTING ILLNESS:  Martha Hamilton is a  71 y.o.  female with PMH listed below who was referred to me for evaluation of anemia Reviewed patient's recent labs that was done.  08/08/20 Labs revealed anemia with hemoglobin of 9.2, MCV 93 .   Reviewed patient's previous labs ordered by primary care physician's office, anemia is chronic onset , duration is since 2013, with baseline in 11s, worsened during the recent 6 months, after she stops taking oral iron supplementation.   Associated signs and symptoms: Patient reports fatigue. denies SOB with exertion.  Denies weight loss, easy bruising, hematochezia, hemoptysis, hematuria. Context: History of GI bleeding: deneis               History of gastric restriction surgery for GERD                Last colonoscopy: 2014. Last Endoscopy 2020.  Remote history of right breast cancer, s/p lumpectomy with node. She denies any previous chemotherapy or any anti estrogen therapy.     Family history positive for brother with esophageal cancer, mother with RCC.  I have recommended genetic testing.  She wanted to defer and if she changes her mind she will update me..   INTERVAL HISTORY Martha Hamilton is a 72 y.o. female who has above history reviewed by me today presents for follow up visit for iron deficiency anemia Patient reports feeling well today.  Patient was seen by nurse practitioner Faythe Casa.  Patient has received additional IV Venofer treatments.  As well as monthly vitamin B12 parenteral injections.  Review of Systems  Constitutional:  Positive for fatigue. Negative for appetite change, chills and fever.  HENT:   Negative  for hearing loss and voice change.   Eyes:  Negative for eye problems.  Respiratory:  Negative for chest tightness and cough.   Cardiovascular:  Negative for chest pain.  Gastrointestinal:  Negative for abdominal distention, abdominal pain and blood in stool.  Endocrine: Negative for hot flashes.  Genitourinary:  Negative for difficulty urinating and frequency.   Musculoskeletal:  Negative for arthralgias.  Skin:  Negative for itching and rash.  Neurological:  Negative for extremity weakness.  Hematological:  Negative for adenopathy.  Psychiatric/Behavioral:  Negative for confusion.     MEDICAL HISTORY:  Past Medical History:  Diagnosis Date   Allergic rhinitis    Breast cancer ([place]) 1996   right breast   DM2 (diabetes mellitus, type 2) (HCC)    GERD (gastroesophageal reflux disease)    Hx of cardiovascular stress test    a. Lex MV 2/14:  EF 69%, no ischemia   Hyperlipidemia    Hypertension    IDA (iron deficiency anemia) 08/27/2020   Iron deficiency anemia    Obesity    Osteoarthritis    hands   Ulcer     SURGICAL HISTORY: Past Surgical History:  Procedure Laterality Date   APPENDECTOMY  1976   BREAST LUMPECTOMY Right 1996   right breast, lumpectomy w/nodes   CATARACT EXTRACTION W/PHACO Left 11/28/2020   Procedure: CATARACT EXTRACTION PHACO AND INTRAOCULAR LENS PLACEMENT ([place]) LEFT DIABETIC;  Surgeon: Leandrew Koyanagi, MD;  Location: [place];  Service: Ophthalmology;  Laterality: Left;  5.02  01:07.4 7.4%   CATARACT EXTRACTION W/PHACO Right 12/12/2020   Procedure: CATARACT EXTRACTION PHACO AND INTRAOCULAR LENS PLACEMENT ([facility]) RIGHT DIABETIC 5.22 00:59.2;  Surgeon: Leandrew Koyanagi, MD;  Location: [place];  Service: Ophthalmology;  Laterality: Right;   CHONDROPLASTY Left 02/10/2018   Procedure: CHONDROPLASTY;  Surgeon: Lovell Sheehan, MD;  Location: [facility];  Service: Orthopedics;  Laterality: Left;   GASTRIC RESTRICTION SURGERY     for  reflux   HYSTEROSCOPY WITH D & C N/A 10/18/2012   Procedure: DILATATION AND CURETTAGE /HYSTEROSCOPY;  Surgeon: Mora Bellman, MD;  Location: [facility];  Service: Gynecology;  Laterality: N/A;   KNEE ARTHROSCOPY WITH MEDIAL MENISECTOMY Left 02/10/2018   Procedure: KNEE ARTHROSCOPY WITH MEDIAL and LATERAL MENISECTOMY;  Surgeon: Lovell Sheehan, MD;  Location: [facility];  Service: Orthopedics;  Laterality: Left;   POLYPECTOMY N/A 10/18/2012   Procedure: POLYPECTOMY;  Surgeon: Mora Bellman, MD;  Location: [place] [facility];  Service: Gynecology;  Laterality: N/A;   SYNOVECTOMY Left 02/10/2018   Procedure: SYNOVECTOMY;  Surgeon: Lovell Sheehan, MD;  Location: [facility];  Service: Orthopedics;  Laterality: Left;    SOCIAL HISTORY: Social History   Socioeconomic History   Marital status: Single    Spouse name: Not on file   Number of children: 0   Years of education: Not on file   Highest education level: Not on file  Occupational History   Occupation: retired    Fish farm manager: RETIRED  Tobacco Use   Smoking status: Never   Smokeless tobacco: Never  Vaping Use   Vaping Use: Never used  Substance and Sexual Activity   Alcohol use: Never    Alcohol/week: 0.0 standard drinks   Drug use: No   Sexual activity: Not Currently    Birth control/protection: Post-menopausal  Other Topics Concern   Not on file  Social History Narrative   Lives with, and cares for her elderly mother.   Social Determinants of Health   Financial Resource Strain: Low Risk    Difficulty of Paying Living Expenses: Not hard at all  Food Insecurity: No Food Insecurity   Worried About Charity fundraiser in the Last Year: Never true   Dent in the Last Year: Never true  Transportation Needs: No Transportation Needs   Lack of Transportation (Medical): No   Lack of Transportation (Non-Medical): No  Physical Activity: Insufficiently Active   Days of Exercise per Week: 7 days   Minutes of Exercise per Session: 20 min  Stress:  No Stress Concern Present   Feeling of Stress : Not at all  Social Connections: Not on file  Intimate Partner Violence: Not At Risk   Fear of Current or Ex-Partner: No   Emotionally Abused: No   Physically Abused: No   Sexually Abused: No    FAMILY HISTORY: Family History  Problem Relation Age of Onset   Diabetes Mother    Heart disease Mother        Pacemaker, CHF   Hypertension Mother    Kidney cancer Mother    Stroke Father    CAD Father 4       Died with MI   Esophageal cancer Brother    Heart disease Sister    Colon cancer Neg Hx    Rectal cancer Neg Hx    Stomach cancer Neg Hx    Pancreatic cancer Neg Hx     ALLERGIES:  is allergic to lipitor [atorvastatin], niacin, questran [cholestyramine], and zocor [simvastatin].  MEDICATIONS:  Current Outpatient Medications  Medication Sig Dispense Refill   Accu-Chek Softclix Lancets lancets TEST BLOOD SUGAR EVERY DAY FOR DIABETES 100 each 0   acetaminophen (TYLENOL) 500 MG tablet Take 1,000 mg by mouth every 6 (six) hours as needed for mild pain.      Alcohol Swabs (DROPSAFE ALCOHOL PREP) 70 % PADS USE ONE TIME DAILY AS DIRECTED WHEN CHECKING BLOOD SUGAR 100 each 0   amitriptyline (ELAVIL) 25 MG tablet TAKE 1 TABLET (25 MG TOTAL) BY MOUTH AT BEDTIME. 90 tablet 1   aspirin 81 MG tablet Take 81 mg by mouth at bedtime.      Blood Glucose Calibration (ACCU-CHEK AVIVA) SOLN USE TO CALIBRATE METER AS DIRECTED 1 each 2   Blood Glucose Monitoring Suppl (ACCU-CHEK AVIVA PLUS) w/Device KIT Use to check blood sugar once daily for DM (dx. E11.9) 1 kit 0   calcium elemental as carbonate (BARIATRIC TUMS ULTRA) 400 MG chewable tablet Chew 1,000 mg by mouth daily as needed for heartburn.     Camphor-Menthol-Methyl Sal (SALONPAS) 3.07-19-08 % PTCH Apply topically.     Cholecalciferol (VITAMIN D) 2000 units tablet Take 2,000 Units by mouth daily.     fenofibrate (TRICOR) 145 MG tablet TAKE 1 TABLET EVERY DAY 90 tablet 3   glipiZIDE (GLUCOTROL  XL) 2.5 MG 24 hr tablet TAKE 1 TABLET EVERY EVENING 90 tablet 0   glucose blood (ACCU-CHEK AVIVA PLUS) test strip TEST BLOOD SUGAR EVERY DAY FOR DIABETES 100 strip 1   hydrochlorothiazide (HYDRODIURIL) 25 MG tablet TAKE 1 TABLET EVERY DAY 90 tablet 0   loratadine (CLARITIN) 10 MG tablet Take 10 mg by mouth daily as needed for allergies.     metFORMIN (GLUCOPHAGE) 1000 MG tablet TAKE 1 TABLET TWICE DAILY WITH A MEAL 180 tablet 0   omeprazole (PRILOSEC) 40 MG capsule TAKE 1 CAPSULE IN THE MORNING AND AT BEDTIME. 180 capsule 1   ondansetron (ZOFRAN) 4 MG tablet TAKE 1 TABLET EVERY 4 HOURS AS NEEDED FOR NAUSEA AND VOMITING 90 tablet 0   SALINE NASAL SPRAY NA Place 1 spray into the nose daily as needed (congestion).      meclizine (ANTIVERT) 25 MG tablet Take 1 tablet (25 mg total) by mouth 3 (three) times daily as needed for dizziness. (Patient not taking: Reported on 08/05/2021) 90 tablet 3   No current facility-administered medications for this visit.     PHYSICAL EXAMINATION: ECOG PERFORMANCE STATUS: 1 - Symptomatic but completely ambulatory Vitals:   08/05/21 1327  BP: (!) 160/89  Pulse: (!) 101  Resp: 16  Temp: 99 F (37.2 C)  SpO2: 97%   Filed Weights   08/05/21 1327  Weight: 164 lb 8 oz (74.6 kg)    Physical Exam Constitutional:      General: She is not in acute distress. HENT:     Head: Normocephalic and atraumatic.  Eyes:     General: No scleral icterus. Cardiovascular:     Rate and Rhythm: Normal rate and regular rhythm.     Heart sounds: Normal heart sounds.  Pulmonary:     Effort: Pulmonary effort is normal. No respiratory distress.     Breath sounds: No wheezing.  Abdominal:     General: Bowel sounds are normal. There is no distension.     Palpations: Abdomen is soft.  Musculoskeletal:        General: No deformity. Normal range of motion.     Cervical back: Normal range of motion and neck supple.  Skin:    General: Skin is warm and dry.     Findings: No  erythema or rash.  Neurological:     Mental Status: She is alert and oriented to person, place, and time. Mental status is at baseline.     Cranial Nerves: No cranial nerve deficit.     Coordination: Coordination normal.  Psychiatric:        Mood and Affect: Mood normal.     LABORATORY DATA:  I have reviewed the data as listed Lab Results  Component Value Date   WBC 8.4 08/05/2021   HGB 13.2 08/05/2021   HCT 39.3 08/05/2021   MCV 85.8 08/05/2021   PLT 407 (H) 08/05/2021   Recent Labs    08/08/20 0859 08/27/20 1611 11/08/20 0826  NA 136 136 139  K 4.5 4.8 4.5  CL 102 103 103  CO2 26 21* 27  GLUCOSE 89 108* 117*  BUN 19 26* 19  CREATININE 1.23* 1.36* 1.20  CALCIUM 9.7 9.5 9.6  GFRNONAA  --  42*  --   PROT  --  7.9 7.6  ALBUMIN  --  3.8 4.1  AST  --  27 15  ALT  --  20 12  ALKPHOS  --  35* 38*  BILITOT  --  0.4 0.2    Iron/TIBC/Ferritin/ %Sat    Component Value Date/Time   IRON 72 08/05/2021 1257   TIBC 438 08/05/2021 1257   FERRITIN 89 08/05/2021 1257   IRONPCTSAT 16 08/05/2021 1257        ASSESSMENT & PLAN:  1. B12 deficiency   2. Other iron deficiency anemia    Iron deficiency anemia Labs reviewed and discussed with patient Her hemoglobin has completely normalized. Iron panel also shows normal iron saturation and ferritin.  Hold additional IV Venofer treatments at this point.   Vitamin B12 deficiency, this may have been due to missing her December dose B12 1000 units IM x 1 today. B12 level came back decreased.  Recommend weekly vitamin B12 x3 followed by monthly B12 x2.   Follow up  3 months. Labs prior to MD visit + Venofer and B12 injections.    Orders Placed This Encounter  Procedures   Vitamin B12    Standing Status:   Future    Standing Expiration Date:   08/05/2022   CBC with Differential/Platelet    Standing Status:   Future    Standing Expiration Date:   08/05/2022   Iron and TIBC([facility])    Standing Status:   Future     Standing Expiration Date:   08/05/2022    All questions were answered. The patient knows to call the clinic with any problems questions or concerns. Cc. Tower, Martha Fanny, MD   Earlie Server, MD, PhD 08/05/2021

## 2021-08-05 NOTE — Progress Notes (Signed)
Pt in for follow up, reports she fell on 07/24/21 and has been very sore but doing better now.

## 2021-08-06 ENCOUNTER — Telehealth: Payer: Self-pay

## 2021-08-06 NOTE — Telephone Encounter (Signed)
-----   Message from Earlie Server, MD sent at 08/05/2021 10:43 PM EST ----- Her follow up plan is  Weekly B12 injection around 1/30, 2/6, 2/13, keep B12 injection on 2/20, 3/20.  Keep lab 4/21 lab appt, and cancel 4/21 b12 injection and Venofer. Around 4/24, add MD visit +/- Venofer and B12.    Let her know that her iron labs are normal. No need to do IV venofer for now. She may try to take one iron pill per day if she is able to tolerate.  B12 is lower, that's why I added additional weekly B12 followed by monthly B12.  Thanks.

## 2021-08-06 NOTE — Telephone Encounter (Signed)
Patient informed of MD recommendations and verbalized understanding.

## 2021-08-12 ENCOUNTER — Other Ambulatory Visit: Payer: Self-pay

## 2021-08-12 ENCOUNTER — Inpatient Hospital Stay: Payer: Medicare PPO

## 2021-08-12 DIAGNOSIS — D508 Other iron deficiency anemias: Secondary | ICD-10-CM

## 2021-08-12 DIAGNOSIS — E538 Deficiency of other specified B group vitamins: Secondary | ICD-10-CM | POA: Diagnosis not present

## 2021-08-12 DIAGNOSIS — D509 Iron deficiency anemia, unspecified: Secondary | ICD-10-CM | POA: Diagnosis not present

## 2021-08-12 MED ORDER — CYANOCOBALAMIN 1000 MCG/ML IJ SOLN
1000.0000 ug | Freq: Once | INTRAMUSCULAR | Status: AC
  Administered 2021-08-12: 1000 ug via INTRAMUSCULAR
  Filled 2021-08-12: qty 1

## 2021-08-19 ENCOUNTER — Inpatient Hospital Stay: Payer: Medicare PPO | Attending: Oncology

## 2021-08-19 ENCOUNTER — Other Ambulatory Visit: Payer: Self-pay

## 2021-08-19 DIAGNOSIS — E538 Deficiency of other specified B group vitamins: Secondary | ICD-10-CM | POA: Diagnosis not present

## 2021-08-19 DIAGNOSIS — D508 Other iron deficiency anemias: Secondary | ICD-10-CM

## 2021-08-19 MED ORDER — CYANOCOBALAMIN 1000 MCG/ML IJ SOLN
1000.0000 ug | Freq: Once | INTRAMUSCULAR | Status: AC
  Administered 2021-08-19: 1000 ug via INTRAMUSCULAR
  Filled 2021-08-19: qty 1

## 2021-08-26 ENCOUNTER — Inpatient Hospital Stay: Payer: Medicare PPO

## 2021-08-26 ENCOUNTER — Other Ambulatory Visit: Payer: Self-pay

## 2021-08-26 DIAGNOSIS — D508 Other iron deficiency anemias: Secondary | ICD-10-CM

## 2021-08-26 DIAGNOSIS — E538 Deficiency of other specified B group vitamins: Secondary | ICD-10-CM | POA: Diagnosis not present

## 2021-08-26 MED ORDER — CYANOCOBALAMIN 1000 MCG/ML IJ SOLN
1000.0000 ug | Freq: Once | INTRAMUSCULAR | Status: AC
  Administered 2021-08-26: 1000 ug via INTRAMUSCULAR
  Filled 2021-08-26: qty 1

## 2021-09-02 ENCOUNTER — Other Ambulatory Visit: Payer: Self-pay

## 2021-09-02 ENCOUNTER — Inpatient Hospital Stay: Payer: Medicare PPO | Attending: Oncology

## 2021-09-02 DIAGNOSIS — E538 Deficiency of other specified B group vitamins: Secondary | ICD-10-CM | POA: Insufficient documentation

## 2021-09-02 DIAGNOSIS — D508 Other iron deficiency anemias: Secondary | ICD-10-CM

## 2021-09-02 MED ORDER — CYANOCOBALAMIN 1000 MCG/ML IJ SOLN
1000.0000 ug | Freq: Once | INTRAMUSCULAR | Status: AC
  Administered 2021-09-02: 1000 ug via INTRAMUSCULAR
  Filled 2021-09-02: qty 1

## 2021-09-06 DIAGNOSIS — M1712 Unilateral primary osteoarthritis, left knee: Secondary | ICD-10-CM | POA: Diagnosis not present

## 2021-09-08 ENCOUNTER — Telehealth: Payer: Self-pay | Admitting: Family Medicine

## 2021-09-08 DIAGNOSIS — E538 Deficiency of other specified B group vitamins: Secondary | ICD-10-CM

## 2021-09-08 DIAGNOSIS — D649 Anemia, unspecified: Secondary | ICD-10-CM

## 2021-09-08 DIAGNOSIS — E119 Type 2 diabetes mellitus without complications: Secondary | ICD-10-CM

## 2021-09-08 DIAGNOSIS — E1169 Type 2 diabetes mellitus with other specified complication: Secondary | ICD-10-CM

## 2021-09-08 DIAGNOSIS — I10 Essential (primary) hypertension: Secondary | ICD-10-CM

## 2021-09-08 NOTE — Telephone Encounter (Signed)
-----   Message from Ellamae Sia sent at 08/30/2021  7:13 AM EST ----- Regarding: Lab oreders for Monday, 2.27.23 Patient is scheduled for CPX labs, please order future labs, Thanks , Karna Christmas

## 2021-09-09 ENCOUNTER — Other Ambulatory Visit: Payer: Self-pay

## 2021-09-09 ENCOUNTER — Other Ambulatory Visit (INDEPENDENT_AMBULATORY_CARE_PROVIDER_SITE_OTHER): Payer: Medicare PPO

## 2021-09-09 DIAGNOSIS — E785 Hyperlipidemia, unspecified: Secondary | ICD-10-CM

## 2021-09-09 DIAGNOSIS — E538 Deficiency of other specified B group vitamins: Secondary | ICD-10-CM | POA: Diagnosis not present

## 2021-09-09 DIAGNOSIS — D649 Anemia, unspecified: Secondary | ICD-10-CM

## 2021-09-09 DIAGNOSIS — I10 Essential (primary) hypertension: Secondary | ICD-10-CM | POA: Diagnosis not present

## 2021-09-09 DIAGNOSIS — E119 Type 2 diabetes mellitus without complications: Secondary | ICD-10-CM | POA: Diagnosis not present

## 2021-09-09 DIAGNOSIS — E1169 Type 2 diabetes mellitus with other specified complication: Secondary | ICD-10-CM

## 2021-09-09 LAB — LIPID PANEL
Cholesterol: 191 mg/dL (ref 0–200)
HDL: 39.3 mg/dL (ref >39.00)
Total CHOL/HDL Ratio: 5
Triglycerides: 455 mg/dL — ABNORMAL HIGH (ref 0.0–149.0)

## 2021-09-09 LAB — CBC WITH DIFFERENTIAL/PLATELET
Basophils Absolute: 0.1 10*3/uL (ref 0.0–0.1)
Basophils Relative: 1.2 % (ref 0.0–3.0)
Eosinophils Absolute: 0.2 10*3/uL (ref 0.0–0.7)
Eosinophils Relative: 2.8 % (ref 0.0–5.0)
HCT: 37.8 % (ref 36.0–46.0)
Hemoglobin: 12.6 g/dL (ref 12.0–15.0)
Lymphocytes Relative: 23.1 % (ref 12.0–46.0)
Lymphs Abs: 2 10*3/uL (ref 0.7–4.0)
MCHC: 33.3 g/dL (ref 30.0–36.0)
MCV: 87.9 fl (ref 78.0–100.0)
Monocytes Absolute: 0.4 10*3/uL (ref 0.1–1.0)
Monocytes Relative: 5.3 % (ref 3.0–12.0)
Neutro Abs: 5.7 10*3/uL (ref 1.4–7.7)
Neutrophils Relative %: 67.6 % (ref 43.0–77.0)
Platelets: 390 10*3/uL (ref 150.0–400.0)
RBC: 4.3 Mil/uL (ref 3.87–5.11)
RDW: 14 % (ref 11.5–15.5)
WBC: 8.4 10*3/uL (ref 4.0–10.5)

## 2021-09-09 LAB — IRON: Iron: 63 ug/dL (ref 42–145)

## 2021-09-09 LAB — COMPREHENSIVE METABOLIC PANEL
ALT: 10 U/L (ref 0–35)
AST: 11 U/L (ref 0–37)
Albumin: 4.2 g/dL (ref 3.5–5.2)
Alkaline Phosphatase: 34 U/L — ABNORMAL LOW (ref 39–117)
BUN: 31 mg/dL — ABNORMAL HIGH (ref 6–23)
CO2: 29 mEq/L (ref 19–32)
Calcium: 9.8 mg/dL (ref 8.4–10.5)
Chloride: 98 mEq/L (ref 96–112)
Creatinine, Ser: 1.24 mg/dL — ABNORMAL HIGH (ref 0.40–1.20)
GFR: 44.06 mL/min — ABNORMAL LOW (ref >60.00)
Glucose, Bld: 135 mg/dL — ABNORMAL HIGH (ref 70–99)
Potassium: 4.6 mEq/L (ref 3.5–5.1)
Sodium: 138 mEq/L (ref 135–145)
Total Bilirubin: 0.3 mg/dL (ref 0.2–1.2)
Total Protein: 7.5 g/dL (ref 6.0–8.3)

## 2021-09-09 LAB — LDL CHOLESTEROL, DIRECT: Direct LDL: 100 mg/dL

## 2021-09-09 LAB — HEMOGLOBIN A1C: Hgb A1c MFr Bld: 7.1 % — ABNORMAL HIGH (ref 4.6–6.5)

## 2021-09-10 LAB — VITAMIN B12: Vitamin B-12: 402 pg/mL (ref 211–911)

## 2021-09-10 LAB — TSH: TSH: 1.17 u[IU]/mL (ref 0.35–5.50)

## 2021-09-17 ENCOUNTER — Other Ambulatory Visit: Payer: Self-pay

## 2021-09-17 ENCOUNTER — Encounter: Payer: Self-pay | Admitting: Family Medicine

## 2021-09-17 ENCOUNTER — Ambulatory Visit (INDEPENDENT_AMBULATORY_CARE_PROVIDER_SITE_OTHER): Payer: Medicare PPO | Admitting: Family Medicine

## 2021-09-17 VITALS — BP 142/80 | HR 120 | Temp 97.3°F | Ht 59.25 in | Wt 163.0 lb

## 2021-09-17 DIAGNOSIS — I10 Essential (primary) hypertension: Secondary | ICD-10-CM | POA: Diagnosis not present

## 2021-09-17 DIAGNOSIS — K219 Gastro-esophageal reflux disease without esophagitis: Secondary | ICD-10-CM

## 2021-09-17 DIAGNOSIS — E1169 Type 2 diabetes mellitus with other specified complication: Secondary | ICD-10-CM

## 2021-09-17 DIAGNOSIS — R000 Tachycardia, unspecified: Secondary | ICD-10-CM

## 2021-09-17 DIAGNOSIS — E119 Type 2 diabetes mellitus without complications: Secondary | ICD-10-CM

## 2021-09-17 DIAGNOSIS — Z0000 Encounter for general adult medical examination without abnormal findings: Secondary | ICD-10-CM

## 2021-09-17 DIAGNOSIS — D508 Other iron deficiency anemias: Secondary | ICD-10-CM | POA: Diagnosis not present

## 2021-09-17 DIAGNOSIS — E669 Obesity, unspecified: Secondary | ICD-10-CM | POA: Diagnosis not present

## 2021-09-17 DIAGNOSIS — E538 Deficiency of other specified B group vitamins: Secondary | ICD-10-CM

## 2021-09-17 DIAGNOSIS — E785 Hyperlipidemia, unspecified: Secondary | ICD-10-CM

## 2021-09-17 DIAGNOSIS — N1832 Chronic kidney disease, stage 3b: Secondary | ICD-10-CM

## 2021-09-17 MED ORDER — METOPROLOL SUCCINATE ER 50 MG PO TB24: 50.0000 mg | ORAL_TABLET | Freq: Every day | ORAL | 3 refills | Status: DC

## 2021-09-17 MED ORDER — AMITRIPTYLINE HCL 25 MG PO TABS: 25.0000 mg | ORAL_TABLET | Freq: Every day | ORAL | 3 refills | Status: DC

## 2021-09-17 MED ORDER — METFORMIN HCL 1000 MG PO TABS: 500.0000 mg | ORAL_TABLET | Freq: Two times a day (BID) | ORAL | 3 refills | Status: DC

## 2021-09-17 MED ORDER — GLIPIZIDE ER 5 MG PO TB24: 5.0000 mg | ORAL_TABLET | Freq: Every day | ORAL | 3 refills | Status: DC

## 2021-09-17 NOTE — Assessment & Plan Note (Signed)
Disc goals for lipids and reasons to control them ?Rev last labs with pt ?Rev low sat fat diet in detail ? ?Intolerant of all statins  ?LDL is 100  ?Trig still over 400 (also glucose is up) ?Plan to continue fenofibrate 145 mg daily  ? ?

## 2021-09-17 NOTE — Assessment & Plan Note (Signed)
Pt requires omeprazole 40 mg bid  ?Sees GI  ?Aware in light of renal function  ?

## 2021-09-17 NOTE — Patient Instructions (Addendum)
If you are interested in the new shingles vaccine (Shingrix) - call your local pharmacy to check on coverage and availability  ?If affordable, get on a wait list at your pharmacy to get the vaccine. ? ?I do recommend the newest covid (bivalent) booster  ? ?For kidney health  ?Stop the hctz  ?Keep cutting the metformin in 1/2  ? ?Increase metoprolol xl to 50 mg  ? ?Increase glipizide xl to 5 mg  ?If low glucose readings let us know  ? ?Follow up in 3 months with lab prior  ? ? ? ? ? ? ?

## 2021-09-17 NOTE — Assessment & Plan Note (Signed)
inst to  ?Continue lower metformin dose ?Hold hctz  ?F/u 3 mo  ?Keep up good fluid intake  ?Avoid nsaids  ?

## 2021-09-17 NOTE — Progress Notes (Signed)
Subjective:    Patient ID: Martha Hamilton, female    DOB: [birth year], 71 y.o.   MRN: [id number]  This visit occurred during the SARS-CoV-2 public health emergency.  Safety protocols were in place, including screening questions prior to the visit, additional usage of staff PPE, and extensive cleaning of exam room while observing appropriate contact time as indicated for disinfecting solutions.   HPI Here for health maintenance exam and to review chronic medical problems    Had amw July 2022 reviewed   Wt Readings from Last 3 Encounters:  09/17/21 163 lb (73.9 kg)  08/05/21 164 lb 8 oz (74.6 kg)  05/01/21 166 lb (75.3 kg)   32.64 kg/m  Not doing a lot  A lot of trouble in her knee and had a shot in late feb with Dr Harlow Mares  It is helping some /still slow walking  Bad fall in [place] a lot in December / had covid   Zoster status -interested if covered Aletha Halim call pharmacy  She got a flu shot in January  Td postponed for insurance Pna vaccines utd   Mammogram 04/2021 No lumps on self exam   Colonoscopy 04/2021  Also EGD  (taking ppi)  Capsule endoscopy neg in nov   Dexa 04/2020 at the [facility]  One fall No fractures  Supplements vit D and tums  Exercise- limited due to knee pain right now    HTN  bp is stable today  No cp or palpitations or headaches or edema  No side effects to medicines  BP Readings from Last 3 Encounters:  09/17/21 (!) 142/80  08/05/21 (!) 160/89  06/11/21 114/63     Pulse Readings from Last 3 Encounters:  09/17/21 (!) 120  08/05/21 (!) 101  06/11/21 (!) 106   Losartan 50 mg daily  Hctz 25 mg daily  Metoprolol 25 xl  mg daily  (she takes it in the am) -took it today   Per pt her HR is always up -this worries her   Having a lot of anxiety  Unsure why    Lab Results  Component Value Date   CREATININE 1.24 (H) 09/09/2021   BUN 31 (H) 09/09/2021   NA 138 09/09/2021   K 4.6 09/09/2021   CL 98 09/09/2021   CO2 29  09/09/2021     DM2 Lab Results  Component Value Date   HGBA1C 7.1 (H) 09/09/2021   This is up  Metformin 1000 mg bid-then cut in 1/2 due to  Glipizide xl 2.5 mg daily   Diet is fair  Avoids sweets as a rule  Some jello Eats apples   Limited exercise due to knee (was walking prior)   No low sugars   Eye exam is scheduled in May      Hyperlipidemia Lab Results  Component Value Date   CHOL 191 09/09/2021   CHOL 218 (H) 07/19/2020   CHOL 197 03/02/2020   Lab Results  Component Value Date   HDL 39.30 09/09/2021   HDL 36.60 (L) 07/19/2020   HDL 35.50 (L) 03/02/2020   Lab Results  Component Value Date   LDLCALC 51 10/21/2011   LDLCALC 48 04/29/2010   Lab Results  Component Value Date   TRIG (H) 09/09/2021    455.0 Triglyceride is over 400; calculations on Lipids are invalid.   TRIG (H) 07/19/2020    497.0 Triglyceride is over 400; calculations on Lipids are invalid.   TRIG (H) 03/02/2020  499.0 Triglyceride is over 400; calculations on Lipids are invalid.   Lab Results  Component Value Date   CHOLHDL 5 09/09/2021   CHOLHDL 6 07/19/2020   CHOLHDL 6 03/02/2020   Lab Results  Component Value Date   LDLDIRECT 100.0 09/09/2021   LDLDIRECT 108.0 07/19/2020   LDLDIRECT 100.0 03/02/2020   Fenofibrate 145 mg daily  Intolerant to all statins tried   Iron and B12 def  Gets shots monthly and iron inf prn Cannot tolerate iron pills  Sees hematology Lab Results  Component Value Date   WBC 8.4 09/09/2021   HGB 12.6 09/09/2021   HCT 37.8 09/09/2021   MCV 87.9 09/09/2021   PLT 390.0 09/09/2021   Lab Results  Component Value Date   [id number] 300 09/09/2021   Patient Active Problem List   Diagnosis Date Noted   Family history of cancer 08/27/2020   History of breast cancer 08/27/2020   IDA (iron deficiency anemia) 08/27/2020   B12 deficiency 08/27/2020   Nausea 07/24/2020   Chronic kidney disease (CKD) stage G3b/A1, moderately decreased glomerular  filtration rate (GFR) between 30-44 mL/min/1.73 square meter and albuminuria creatinine ratio less than 30 mg/g ([place]) 07/24/2020   Tachycardia 01/11/2019   Fatty liver 06/21/2018   Obesity (BMI 30-39.9) 06/21/2018   Dyspepsia 05/25/2018   GERD (gastroesophageal reflux disease) 05/25/2018   H/O vertigo 05/11/2018   Thrombocytosis 12/25/2017   Tear of medial meniscus of knee 12/21/2017   Left knee pain 09/21/2017   Low back pain 07/06/2017   Welcome to Medicare preventive visit 12/17/2016   Grief reaction 12/17/2016   Estrogen deficiency 09/08/2016   Muscle pain 02/08/2016   Normocytic anemia 02/09/2013   Abnormal EKG 07/27/2012   Special screening for malignant neoplasms, colon 04/29/2011   Gynecological examination 04/29/2011   Routine general medical examination at a health care facility 04/20/2011   OSTEOARTHRITIS, HANDS, BILATERAL 10/31/2008   Diabetes type 2, controlled ([place]) 07/10/2008   Hyperlipidemia associated with type 2 diabetes mellitus ([place]) 01/26/2008   Essential hypertension 01/26/2008   ALLERGIC RHINITIS 12/07/2007   Past Medical History:  Diagnosis Date   Allergic rhinitis    Breast cancer ([place]) 1996   right breast   DM2 (diabetes mellitus, type 2) ([place])    GERD (gastroesophageal reflux disease)    Hx of cardiovascular stress test    a. Lex MV 2/14:  EF 69%, no ischemia   Hyperlipidemia    Hypertension    IDA (iron deficiency anemia) 08/27/2020   Iron deficiency anemia    Obesity    Osteoarthritis    hands   Ulcer    Past Surgical History:  Procedure Laterality Date   APPENDECTOMY  1976   BREAST LUMPECTOMY Right 1996   right breast, lumpectomy w/nodes   CATARACT EXTRACTION W/PHACO Left 11/28/2020   Procedure: CATARACT EXTRACTION PHACO AND INTRAOCULAR LENS PLACEMENT ([place]) LEFT DIABETIC;  Surgeon: Leandrew Koyanagi, MD;  Location: [facility];  Service: Ophthalmology;  Laterality: Left;  5.02 01:07.4 7.4%   CATARACT EXTRACTION W/PHACO Right  12/12/2020   Procedure: CATARACT EXTRACTION PHACO AND INTRAOCULAR LENS PLACEMENT ([facility]) RIGHT DIABETIC 5.22 00:59.2;  Surgeon: Leandrew Koyanagi, MD;  Location: [facility];  Service: Ophthalmology;  Laterality: Right;   CHONDROPLASTY Left 02/10/2018   Procedure: CHONDROPLASTY;  Surgeon: Lovell Sheehan, MD;  Location: [facility];  Service: Orthopedics;  Laterality: Left;   GASTRIC RESTRICTION SURGERY     for reflux   HYSTEROSCOPY WITH D & C N/A 10/18/2012  Procedure: DILATATION AND CURETTAGE /HYSTEROSCOPY;  Surgeon: Mora Bellman, MD;  Location: [facility];  Service: Gynecology;  Laterality: N/A;   KNEE ARTHROSCOPY WITH MEDIAL MENISECTOMY Left 02/10/2018   Procedure: KNEE ARTHROSCOPY WITH MEDIAL and LATERAL MENISECTOMY;  Surgeon: Lovell Sheehan, MD;  Location: [facility];  Service: Orthopedics;  Laterality: Left;   POLYPECTOMY N/A 10/18/2012   Procedure: POLYPECTOMY;  Surgeon: Mora Bellman, MD;  Location: [facility];  Service: Gynecology;  Laterality: N/A;   SYNOVECTOMY Left 02/10/2018   Procedure: SYNOVECTOMY;  Surgeon: Lovell Sheehan, MD;  Location: [facility];  Service: Orthopedics;  Laterality: Left;   Social History   Tobacco Use   Smoking status: Never   Smokeless tobacco: Never  Vaping Use   Vaping Use: Never used  Substance Use Topics   Alcohol use: Never    Alcohol/week: 0.0 standard drinks   Drug use: No   Family History  Problem Relation Age of Onset   Diabetes Mother    Heart disease Mother        Pacemaker, CHF   Hypertension Mother    Kidney cancer Mother    Stroke Father    CAD Father 40       Died with MI   Esophageal cancer Brother    Heart disease Sister    Colon cancer Neg Hx    Rectal cancer Neg Hx    Stomach cancer Neg Hx    Pancreatic cancer Neg Hx    Allergies  Allergen Reactions   Lipitor [Atorvastatin] Other (See Comments)    Muscle cramping   Niacin Nausea And Vomiting   Questran [Cholestyramine] Other (See Comments)    REACTION: Muscles tightened  up, drew up.   Zocor [Simvastatin]     Muscular pain   Current Outpatient Medications on File Prior to Visit  Medication Sig Dispense Refill   Accu-Chek Softclix Lancets lancets TEST BLOOD SUGAR EVERY DAY FOR DIABETES 100 each 0   acetaminophen (TYLENOL) 500 MG tablet Take 1,000 mg by mouth every 6 (six) hours as needed for mild pain.      Alcohol Swabs (DROPSAFE ALCOHOL PREP) 70 % PADS USE ONE TIME DAILY AS DIRECTED WHEN CHECKING BLOOD SUGAR 100 each 0   aspirin 81 MG tablet Take 81 mg by mouth at bedtime.      Blood Glucose Calibration (ACCU-CHEK AVIVA) SOLN USE TO CALIBRATE METER AS DIRECTED 1 each 2   Blood Glucose Monitoring Suppl (ACCU-CHEK AVIVA PLUS) w/Device KIT Use to check blood sugar once daily for DM (dx. E11.9) 1 kit 0   calcium elemental as carbonate (BARIATRIC TUMS ULTRA) 400 MG chewable tablet Chew 1,000 mg by mouth daily as needed for heartburn.     Camphor-Menthol-Methyl Sal (SALONPAS) 3.07-19-08 % PTCH Apply topically.     Cholecalciferol (VITAMIN D) 2000 units tablet Take 2,000 Units by mouth daily.     fenofibrate (TRICOR) 145 MG tablet TAKE 1 TABLET EVERY DAY 90 tablet 3   glucose blood (ACCU-CHEK AVIVA PLUS) test strip TEST BLOOD SUGAR EVERY DAY FOR DIABETES 100 strip 1   loratadine (CLARITIN) 10 MG tablet Take 10 mg by mouth daily as needed for allergies.     meclizine (ANTIVERT) 25 MG tablet Take 1 tablet (25 mg total) by mouth 3 (three) times daily as needed for dizziness. 90 tablet 3   metoprolol tartrate (LOPRESSOR) 25 MG tablet Take 25 mg by mouth daily.     omeprazole (PRILOSEC) 40 MG capsule TAKE 1 CAPSULE IN THE MORNING AND  AT BEDTIME. 180 capsule 1   ondansetron (ZOFRAN) 4 MG tablet TAKE 1 TABLET EVERY 4 HOURS AS NEEDED FOR NAUSEA AND VOMITING 90 tablet 0   SALINE NASAL SPRAY NA Place 1 spray into the nose daily as needed (congestion).      No current facility-administered medications on file prior to visit.     Review of Systems  Constitutional:  Positive  for fatigue. Negative for activity change, appetite change, fever and unexpected weight change.  HENT:  Negative for congestion, ear pain, rhinorrhea, sinus pressure and sore throat.   Eyes:  Negative for pain, redness and visual disturbance.  Respiratory:  Negative for cough, shortness of breath and wheezing.   Cardiovascular:  Negative for chest pain and palpitations.  Gastrointestinal:  Negative for abdominal pain, blood in stool, constipation and diarrhea.       Dyspepsia   Endocrine: Negative for polydipsia and polyuria.  Genitourinary:  Negative for dysuria, frequency and urgency.  Musculoskeletal:  Positive for arthralgias. Negative for back pain and myalgias.       Knee pain   Skin:  Negative for pallor and rash.  Allergic/Immunologic: Negative for environmental allergies.  Neurological:  Negative for dizziness, syncope and headaches.  Hematological:  Negative for adenopathy. Does not bruise/bleed easily.  Psychiatric/Behavioral:  Negative for decreased concentration and dysphoric mood. The patient is not nervous/anxious.       Objective:   Physical Exam Constitutional:      General: She is not in acute distress.    Appearance: Normal appearance. She is well-developed. She is not ill-appearing or diaphoretic.  HENT:     Head: Normocephalic and atraumatic.     Right Ear: Tympanic membrane, ear canal and external ear normal.     Left Ear: Tympanic membrane, ear canal and external ear normal.     Nose: Nose normal. No congestion.     Mouth/Throat:     Mouth: Mucous membranes are moist.     Pharynx: Oropharynx is clear. No posterior oropharyngeal erythema.  Eyes:     General: No scleral icterus.    Extraocular Movements: Extraocular movements intact.     Conjunctiva/sclera: Conjunctivae normal.     Pupils: Pupils are equal, round, and reactive to light.  Neck:     Thyroid: No thyromegaly.     Vascular: No carotid bruit or JVD.  Cardiovascular:     Rate and Rhythm: Normal  rate and regular rhythm.     Pulses: Normal pulses.     Heart sounds: Normal heart sounds.    No gallop.  Pulmonary:     Effort: Pulmonary effort is normal. No respiratory distress.     Breath sounds: Normal breath sounds. No wheezing.     Comments: Good air exch Chest:     Chest wall: No tenderness.  Abdominal:     General: Bowel sounds are normal. There is no distension or abdominal bruit.     Palpations: Abdomen is soft. There is no mass.     Tenderness: There is no abdominal tenderness.     Hernia: No hernia is present.  Genitourinary:    Comments: Breast exam L:No mass, nodules, thickening, tenderness, bulging, retraction, inflamation, nipple discharge or skin changes noted.  No axillary or clavicular LA.     Baseline surgical changes in R breast  Musculoskeletal:        General: No tenderness. Normal range of motion.     Cervical back: Normal range of motion and neck supple. No rigidity. No  muscular tenderness.     Right lower leg: No edema.     Left lower leg: No edema.  Lymphadenopathy:     Cervical: No cervical adenopathy.  Skin:    General: Skin is warm and dry.     Coloration: Skin is not pale.     Findings: No erythema or rash.  Neurological:     Mental Status: She is alert. Mental status is at baseline.     Cranial Nerves: No cranial nerve deficit.     Motor: No abnormal muscle tone.     Coordination: Coordination normal.     Gait: Gait normal.     Deep Tendon Reflexes: Reflexes are normal and symmetric.  Psychiatric:        Mood and Affect: Mood normal.          Assessment & Plan:   Problem List Items Addressed This Visit       Cardiovascular and Mediastinum   Essential hypertension    bp in fair control at this time  BP Readings from Last 1 Encounters:  09/17/21 (!) 142/80  No changes needed Most recent labs reviewed  Disc lifstyle change with low sodium diet and exercise  Losartan 50 mg daily  hctz 25 mg daily- inst to stop it due to renal  insufficiency  Metoprolol xl 25 mg-will increase to 50 for bp and tachycardia  F/u in 3 months       Relevant Medications   metoprolol tartrate (LOPRESSOR) 25 MG tablet   metoprolol succinate (TOPROL-XL) 50 MG 24 hr tablet     Digestive   GERD (gastroesophageal reflux disease)    Pt requires omeprazole 40 mg bid  Sees GI  Aware in light of renal function         Endocrine   Diabetes type 2, controlled ([place])    Lab Results  Component Value Date   HGBA1C 7.1 (H) 09/09/2021  On less metformin due to renal status (now 500 mg bid)  Will inc glipizide xl 2.5 mg daily to 5 mg  F/u 3 mo  Hopefully can change exercise to bike or water due to knee Disc low glycemic diet  Eye exam is scheduled in may       Relevant Medications   glipiZIDE (GLUCOTROL XL) 5 MG 24 hr tablet   metFORMIN (GLUCOPHAGE) 1000 MG tablet   Hyperlipidemia associated with type 2 diabetes mellitus ([facility])    Disc goals for lipids and reasons to control them Rev last labs with pt Rev low sat fat diet in detail  Intolerant of all statins  LDL is 100  Trig still over 400 (also glucose is up) Plan to continue fenofibrate 145 mg daily        Relevant Medications   metoprolol tartrate (LOPRESSOR) 25 MG tablet   metoprolol succinate (TOPROL-XL) 50 MG 24 hr tablet   glipiZIDE (GLUCOTROL XL) 5 MG 24 hr tablet   metFORMIN (GLUCOPHAGE) 1000 MG tablet     Genitourinary   Chronic kidney disease (CKD) stage G3b/A1, moderately decreased glomerular filtration rate (GFR) between 30-44 mL/min/1.73 square meter and albuminuria creatinine ratio less than 30 mg/g ([facility])    inst to  Continue lower metformin dose Hold hctz  F/u 3 mo  Keep up good fluid intake  Avoid nsaids         Other   B12 deficiency    Lab Results  Component Value Date   VITAMINB12 402 09/09/2021  Seeing hematology  B12 shot monthly  On bid ppi       IDA (iron deficiency anemia)    Seen by hematology  Does not tolerate oral iron  Gets prn  IV iron infusions       Obesity (BMI 30-39.9)    Discussed how this problem influences overall health and the risks it imposes  Reviewed plan for weight loss with lower calorie diet (via better food choices and also portion control or program like weight watchers) and exercise building up to or more than 30 minutes 5 days per week including some aerobic activity         Relevant Medications   glipiZIDE (GLUCOTROL XL) 5 MG 24 hr tablet   metFORMIN (GLUCOPHAGE) 1000 MG tablet   Routine general medical examination at a health care facility - Primary    Reviewed health habits including diet and exercise and skin cancer prevention Reviewed appropriate screening tests for age  Also reviewed health mt list, fam hx and immunization status , as well as social and family history   See HPI Labs reviewed  Plans to check cost of shingrix  Mammogram utd  colonoscoy utd dexa utd  One fall and no fx/discussed fall prev , taking ca and D      Tachycardia    HR rising again  Will inc toprol xl to 50 mg daily  F/u 3 mo  Will monitor at home       Relevant Medications   metoprolol succinate (TOPROL-XL) 50 MG 24 hr tablet

## 2021-09-17 NOTE — Assessment & Plan Note (Signed)
Reviewed health habits including diet and exercise and skin cancer prevention ?Reviewed appropriate screening tests for age  ?Also reviewed health mt list, fam hx and immunization status , as well as social and family history   ?See HPI ?Labs reviewed  ?Plans to check cost of shingrix  ?Mammogram utd  ?colonoscoy utd ?dexa utd  ?One fall and no fx/discussed fall prev , taking ca and D ?

## 2021-09-17 NOTE — Assessment & Plan Note (Signed)
Discussed how this problem influences overall health and the risks it imposes  Reviewed plan for weight loss with lower calorie diet (via better food choices and also portion control or program like weight watchers) and exercise building up to or more than 30 minutes 5 days per week including some aerobic activity    

## 2021-09-17 NOTE — Assessment & Plan Note (Signed)
HR rising again  ?Will inc toprol xl to 50 mg daily  ?F/u 3 mo  ?Will monitor at home  ?

## 2021-09-17 NOTE — Assessment & Plan Note (Signed)
Lab Results  ?Component Value Date  ? HGBA1C 7.1 (H) 09/09/2021  ? ?On less metformin due to renal status (now 500 mg bid)  ?Will inc glipizide xl 2.5 mg daily to 5 mg  ?F/u 3 mo  ?Hopefully can change exercise to bike or water due to knee ?Disc low glycemic diet  ?Eye exam is scheduled in may  ?

## 2021-09-17 NOTE — Assessment & Plan Note (Signed)
Seen by hematology  ?Does not tolerate oral iron  ?Gets prn IV iron infusions  ?

## 2021-09-17 NOTE — Assessment & Plan Note (Signed)
Lab Results  ?Component Value Date  ? [id number] 402 09/09/2021  ? ?Seeing hematology  ?B12 shot monthly  ?On bid ppi  ?

## 2021-09-17 NOTE — Assessment & Plan Note (Signed)
bp in fair control at this time  ?BP Readings from Last 1 Encounters:  ?09/17/21 (!) 142/80  ? ?No changes needed ?Most recent labs reviewed  ?Disc lifstyle change with low sodium diet and exercise  ?Losartan 50 mg daily  ?hctz 25 mg daily- inst to stop it due to renal insufficiency  ?Metoprolol xl 25 mg-will increase to 50 for bp and tachycardia  ?F/u in 3 months  ?

## 2021-09-30 ENCOUNTER — Inpatient Hospital Stay: Payer: Medicare PPO | Attending: Oncology

## 2021-09-30 ENCOUNTER — Other Ambulatory Visit: Payer: Self-pay

## 2021-09-30 DIAGNOSIS — E538 Deficiency of other specified B group vitamins: Secondary | ICD-10-CM | POA: Diagnosis not present

## 2021-09-30 DIAGNOSIS — D508 Other iron deficiency anemias: Secondary | ICD-10-CM

## 2021-09-30 MED ORDER — CYANOCOBALAMIN 1000 MCG/ML IJ SOLN
1000.0000 ug | Freq: Once | INTRAMUSCULAR | Status: AC
  Administered 2021-09-30: 1000 ug via INTRAMUSCULAR
  Filled 2021-09-30: qty 1

## 2021-10-23 DIAGNOSIS — E119 Type 2 diabetes mellitus without complications: Secondary | ICD-10-CM | POA: Diagnosis not present

## 2021-10-23 LAB — HM DIABETES EYE EXAM

## 2021-11-01 ENCOUNTER — Inpatient Hospital Stay: Payer: Medicare PPO | Attending: Oncology

## 2021-11-01 ENCOUNTER — Ambulatory Visit: Payer: Medicare HMO

## 2021-11-01 DIAGNOSIS — D508 Other iron deficiency anemias: Secondary | ICD-10-CM | POA: Insufficient documentation

## 2021-11-01 DIAGNOSIS — E538 Deficiency of other specified B group vitamins: Secondary | ICD-10-CM | POA: Diagnosis not present

## 2021-11-01 LAB — IRON AND TIBC
Iron: 54 ug/dL (ref 28–170)
Saturation Ratios: 12 % (ref 10.4–31.8)
TIBC: 454 ug/dL — ABNORMAL HIGH (ref 250–450)
UIBC: 400 ug/dL

## 2021-11-01 LAB — CBC WITH DIFFERENTIAL/PLATELET
Abs Immature Granulocytes: 0.07 10*3/uL (ref 0.00–0.07)
Basophils Absolute: 0.1 10*3/uL (ref 0.0–0.1)
Basophils Relative: 1 %
Eosinophils Absolute: 0.4 10*3/uL (ref 0.0–0.5)
Eosinophils Relative: 4 %
HCT: 37.6 % (ref 36.0–46.0)
Hemoglobin: 12.7 g/dL (ref 12.0–15.0)
Immature Granulocytes: 1 %
Lymphocytes Relative: 20 %
Lymphs Abs: 1.8 10*3/uL (ref 0.7–4.0)
MCH: 29.8 pg (ref 26.0–34.0)
MCHC: 33.8 g/dL (ref 30.0–36.0)
MCV: 88.3 fL (ref 80.0–100.0)
Monocytes Absolute: 0.5 10*3/uL (ref 0.1–1.0)
Monocytes Relative: 6 %
Neutro Abs: 6.4 10*3/uL (ref 1.7–7.7)
Neutrophils Relative %: 68 %
Platelets: ADEQUATE 10*3/uL (ref 150–400)
RBC: 4.26 MIL/uL (ref 3.87–5.11)
RDW: 13.6 % (ref 11.5–15.5)
WBC: 9.3 10*3/uL (ref 4.0–10.5)
nRBC: 0 % (ref 0.0–0.2)

## 2021-11-01 LAB — VITAMIN B12: Vitamin B-12: 286 pg/mL (ref 180–914)

## 2021-11-04 ENCOUNTER — Encounter: Payer: Self-pay | Admitting: Oncology

## 2021-11-04 ENCOUNTER — Inpatient Hospital Stay (HOSPITAL_BASED_OUTPATIENT_CLINIC_OR_DEPARTMENT_OTHER): Payer: Medicare PPO | Admitting: Oncology

## 2021-11-04 ENCOUNTER — Inpatient Hospital Stay: Payer: Medicare HMO

## 2021-11-04 ENCOUNTER — Inpatient Hospital Stay: Payer: Medicare PPO

## 2021-11-04 VITALS — BP 165/74 | HR 83 | Resp 18

## 2021-11-04 VITALS — BP 175/95 | HR 105 | Temp 98.2°F | Wt 166.0 lb

## 2021-11-04 DIAGNOSIS — E538 Deficiency of other specified B group vitamins: Secondary | ICD-10-CM

## 2021-11-04 DIAGNOSIS — D508 Other iron deficiency anemias: Secondary | ICD-10-CM

## 2021-11-04 MED ORDER — CYANOCOBALAMIN 1000 MCG/ML IJ SOLN
1000.0000 ug | Freq: Once | INTRAMUSCULAR | Status: AC
  Administered 2021-11-04: 1000 ug via INTRAMUSCULAR
  Filled 2021-11-04: qty 1

## 2021-11-04 MED ORDER — SODIUM CHLORIDE 0.9 % IV SOLN
Freq: Once | INTRAVENOUS | Status: AC
  Filled 2021-11-04: qty 250

## 2021-11-04 MED ORDER — VITAMIN B-12 1000 MCG SL SUBL: 1.0000 | SUBLINGUAL_TABLET | Freq: Every day | SUBLINGUAL | 1 refills | Status: DC

## 2021-11-04 MED ORDER — IRON SUCROSE 20 MG/ML IV SOLN
200.0000 mg | Freq: Once | INTRAVENOUS | Status: AC
  Administered 2021-11-04: 200 mg via INTRAVENOUS
  Filled 2021-11-04: qty 10

## 2021-11-04 MED ORDER — SODIUM CHLORIDE 0.9 % IV SOLN: 200.0000 mg | Freq: Once | INTRAVENOUS | Status: DC

## 2021-11-04 NOTE — Patient Instructions (Signed)

## 2021-11-04 NOTE — Progress Notes (Signed)
?Hematology/Oncology follow up note ?[phone] [phone] ? ? ?Patient Care Team: ?Tower, Wynelle Fanny, MD as PCP - General ?[name], [facility] as Pharmacist (Pharmacist) ? ?REFERRING PROVIDER: ?Tower, Wynelle Fanny, MD ? ?CHIEF COMPLAINTS/REASON FOR VISIT:  ?Follow-up for iron deficiency anemia ? ?HISTORY OF PRESENTING ILLNESS:  ?Martha Hamilton is a  71 y.o.  female with PMH listed below who was referred to me for evaluation of anemia ?Reviewed patient's recent labs that was done.  ?08/08/20 Labs revealed anemia with hemoglobin of 9.2, MCV 93 .   ?Reviewed patient's previous labs ordered by primary care physician's office, anemia is chronic onset , duration is since 2013, with baseline in 11s, worsened during the recent 6 months, after she stops taking oral iron supplementation.  ? ?Associated signs and symptoms: Patient reports fatigue. denies SOB with exertion.  ?Denies weight loss, easy bruising, hematochezia, hemoptysis, hematuria. ?Context: History of GI bleeding: deneis ?              History of gastric restriction surgery for GERD ?               Last colonoscopy: 2014. Last Endoscopy 2020. ? ?Remote history of right breast cancer, s/p lumpectomy with node. She denies any previous chemotherapy or any anti estrogen therapy.  ?   ?Family history positive for brother with esophageal cancer, mother with RCC.  ?I have recommended genetic testing.  ?She wanted to defer and if she changes her mind she will update me..  ? ?INTERVAL HISTORY ?Martha Hamilton is a 71 y.o. female who has above history reviewed by me today presents for follow up visit for iron deficiency anemia ?Patient has been on monthly vitamin B12 injection.  Today she reports feeling well.  No new complaints. ? ?Review of Systems  ?Constitutional:  Positive for fatigue. Negative for appetite change, chills and fever.  ?HENT:   Negative for hearing loss and voice change.   ?Eyes:  Negative for eye problems.  ?Respiratory:  Negative  for chest tightness and cough.   ?Cardiovascular:  Negative for chest pain.  ?Gastrointestinal:  Negative for abdominal distention, abdominal pain and blood in stool.  ?Endocrine: Negative for hot flashes.  ?Genitourinary:  Negative for difficulty urinating and frequency.   ?Musculoskeletal:  Negative for arthralgias.  ?Skin:  Negative for itching and rash.  ?Neurological:  Negative for extremity weakness.  ?Hematological:  Negative for adenopathy.  ?Psychiatric/Behavioral:  Negative for confusion.   ? ? ?MEDICAL HISTORY:  ?Past Medical History:  ?Diagnosis Date  ? Allergic rhinitis   ? Breast cancer ([place]) 1996  ? right breast  ? DM2 (diabetes mellitus, type 2) ([place])   ? GERD (gastroesophageal reflux disease)   ? Hx of cardiovascular stress test   ? a. Lex MV 2/14:  EF 69%, no ischemia  ? Hyperlipidemia   ? Hypertension   ? IDA (iron deficiency anemia) 08/27/2020  ? Iron deficiency anemia   ? Obesity   ? Osteoarthritis   ? hands  ? Ulcer   ? ? ?SURGICAL HISTORY: ?Past Surgical History:  ?Procedure Laterality Date  ? APPENDECTOMY  1976  ? BREAST LUMPECTOMY Right 1996  ? right breast, lumpectomy w/nodes  ? CATARACT EXTRACTION W/PHACO Left 11/28/2020  ? Procedure: CATARACT EXTRACTION PHACO AND INTRAOCULAR LENS PLACEMENT ([place]) LEFT DIABETIC;  Surgeon: Leandrew Koyanagi, MD;  Location: [place];  Service: Ophthalmology;  Laterality: Left;  5.02 ?01:07.4 ?7.4%  ? CATARACT EXTRACTION W/PHACO Right 12/12/2020  ? Procedure: CATARACT  EXTRACTION PHACO AND INTRAOCULAR LENS PLACEMENT ([facility]) RIGHT DIABETIC 5.22 00:59.2;  Surgeon: Leandrew Koyanagi, MD;  Location: [place];  Service: Ophthalmology;  Laterality: Right;  ? CHONDROPLASTY Left 02/10/2018  ? Procedure: CHONDROPLASTY;  Surgeon: Lovell Sheehan, MD;  Location: [facility];  Service: Orthopedics;  Laterality: Left;  ? GASTRIC RESTRICTION SURGERY    ? for reflux  ? HYSTEROSCOPY WITH D & C N/A 10/18/2012  ? Procedure: DILATATION AND CURETTAGE /HYSTEROSCOPY;   Surgeon: Mora Bellman, MD;  Location: [facility];  Service: Gynecology;  Laterality: N/A;  ? KNEE ARTHROSCOPY WITH MEDIAL MENISECTOMY Left 02/10/2018  ? Procedure: KNEE ARTHROSCOPY WITH MEDIAL and LATERAL MENISECTOMY;  Surgeon: Lovell Sheehan, MD;  Location: [facility];  Service: Orthopedics;  Laterality: Left;  ? POLYPECTOMY N/A 10/18/2012  ? Procedure: POLYPECTOMY;  Surgeon: Mora Bellman, MD;  Location: [facility];  Service: Gynecology;  Laterality: N/A;  ? SYNOVECTOMY Left 02/10/2018  ? Procedure: SYNOVECTOMY;  Surgeon: Lovell Sheehan, MD;  Location: [facility];  Service: Orthopedics;  Laterality: Left;  ? ? ?SOCIAL HISTORY: ?Social History  ? ?Socioeconomic History  ? Marital status: Single  ?  Spouse name: Not on file  ? Number of children: 0  ? Years of education: Not on file  ? Highest education level: Not on file  ?Occupational History  ? Occupation: retired  ?  Employer: RETIRED  ?Tobacco Use  ? Smoking status: Never  ? Smokeless tobacco: Never  ?Vaping Use  ? Vaping Use: Never used  ?Substance and Sexual Activity  ? Alcohol use: Never  ?  Alcohol/week: 0.0 standard drinks  ? Drug use: No  ? Sexual activity: Not Currently  ?  Birth control/protection: Post-menopausal  ?Other Topics Concern  ? Not on file  ?Social History Narrative  ? Lives with, and cares for her elderly mother.  ? ?Social Determinants of Health  ? ?Financial Resource Strain: Low Risk   ? Difficulty of Paying Living Expenses: Not hard at all  ?Food Insecurity: No Food Insecurity  ? Worried About Charity fundraiser in the Last Year: Never true  ? Ran Out of Food in the Last Year: Never true  ?Transportation Needs: No Transportation Needs  ? Lack of Transportation (Medical): No  ? Lack of Transportation (Non-Medical): No  ?Physical Activity: Insufficiently Active  ? Days of Exercise per Week: 7 days  ? Minutes of Exercise per Session: 20 min  ?Stress: No Stress Concern Present  ? Feeling of Stress : Not at all  ?Social Connections: Not on file   ?Intimate Partner Violence: Not At Risk  ? Fear of Current or Ex-Partner: No  ? Emotionally Abused: No  ? Physically Abused: No  ? Sexually Abused: No  ? ? ?FAMILY HISTORY: ?Family History  ?Problem Relation Age of Onset  ? Diabetes Mother   ? Heart disease Mother   ?     Pacemaker, CHF  ? Hypertension Mother   ? Kidney cancer Mother   ? Stroke Father   ? CAD Father 51  ?     Died with MI  ? Esophageal cancer Brother   ? Heart disease Sister   ? Colon cancer Neg Hx   ? Rectal cancer Neg Hx   ? Stomach cancer Neg Hx   ? Pancreatic cancer Neg Hx   ? ? ?ALLERGIES:  is allergic to lipitor [atorvastatin], niacin, questran [cholestyramine], and zocor [simvastatin]. ? ?MEDICATIONS:  ?Current Outpatient Medications  ?Medication Sig Dispense Refill  ? Accu-Chek  Softclix Lancets lancets TEST BLOOD SUGAR EVERY DAY FOR DIABETES 100 each 0  ? acetaminophen (TYLENOL) 500 MG tablet Take 1,000 mg by mouth every 6 (six) hours as needed for mild pain.     ? Alcohol Swabs (DROPSAFE ALCOHOL PREP) 70 % PADS USE ONE TIME DAILY AS DIRECTED WHEN CHECKING BLOOD SUGAR 100 each 0  ? amitriptyline (ELAVIL) 25 MG tablet Take 1 tablet (25 mg total) by mouth at bedtime. 90 tablet 3  ? aspirin 81 MG tablet Take 81 mg by mouth at bedtime.     ? Blood Glucose Calibration (ACCU-CHEK AVIVA) SOLN USE TO CALIBRATE METER AS DIRECTED 1 each 2  ? Blood Glucose Monitoring Suppl (ACCU-CHEK AVIVA PLUS) w/Device KIT Use to check blood sugar once daily for DM (dx. E11.9) 1 kit 0  ? calcium elemental as carbonate (BARIATRIC TUMS ULTRA) 400 MG chewable tablet Chew 1,000 mg by mouth daily as needed for heartburn.    ? Camphor-Menthol-Methyl Sal (SALONPAS) 3.07-19-08 % PTCH Apply topically.    ? Cholecalciferol (VITAMIN D) 2000 units tablet Take 2,000 Units by mouth daily.    ? Cyanocobalamin (VITAMIN B-12) 1000 MCG SUBL Place 1 tablet (1,000 mcg total) under the tongue daily. 90 tablet 1  ? fenofibrate (TRICOR) 145 MG tablet TAKE 1 TABLET EVERY DAY 90 tablet 3  ?  glipiZIDE (GLUCOTROL XL) 5 MG 24 hr tablet Take 1 tablet (5 mg total) by mouth daily with breakfast. 90 tablet 3  ? glucose blood (ACCU-CHEK AVIVA PLUS) test strip TEST BLOOD SUGAR EVERY DAY FOR DIABETES 100

## 2021-11-13 ENCOUNTER — Ambulatory Visit (INDEPENDENT_AMBULATORY_CARE_PROVIDER_SITE_OTHER)
Admission: RE | Admit: 2021-11-13 | Discharge: 2021-11-13 | Disposition: A | Discharge status: Home / Self Care | Payer: Medicare PPO | Source: Ambulatory Visit | Attending: Family Medicine | Admitting: Family Medicine

## 2021-11-13 ENCOUNTER — Ambulatory Visit: Payer: Medicare PPO | Admitting: Family Medicine

## 2021-11-13 ENCOUNTER — Encounter: Payer: Self-pay | Admitting: Family Medicine

## 2021-11-13 DIAGNOSIS — M25532 Pain in left wrist: Secondary | ICD-10-CM | POA: Diagnosis not present

## 2021-11-13 NOTE — Assessment & Plan Note (Signed)
Lateral wrist pain with slight swelling and no h/o trauma or reped movements  ?Tender medially , worse pain to flex and invert  ? ?Suspect OA and /or tendon problem  ?Pos tinel's sign= may also have features of carpal tunnel (had in past as well)  ?Recommend ice and relative rest  ?Neutral wrist spint  ?voltaren gel up to four times daily prn  ?Xray ordered-plan to follow interp ?Update if not starting to improve in a week or if worsening   ?

## 2021-11-13 NOTE — Patient Instructions (Addendum)
Use a carpal tunnel wrist splint during the day  ? ?Ice may help for 10 minutes at a time  ? ?Use voltaren gel up to 4 times daily  ?Tylenol is ok  ? ?Avoid activities that hurt for now  ? ?Xray now  ?We will notify you and make a plan once it is read  ? ? ?

## 2021-11-13 NOTE — Progress Notes (Signed)
? ?Subjective:  ? ? Patient ID: Martha Hamilton, female    DOB: Jul 29, 1950, 71 y.o.   MRN: [id number] ? ?HPI ?Pt presents for L wrist pain  ? ?Wt Readings from Last 3 Encounters:  ?11/13/21 167 lb 4 oz (75.9 kg)  ?11/04/21 166 lb (75.3 kg)  ?09/17/21 163 lb (73.9 kg)  ? ?33.50 kg/m? ? ?No trauma or trigger that she knows of  ?? Arthritis  ?Pain - 1 week  :  dull ?Worse if she uses it a lot during the day  ?Makes her fingers "draw" with pain or feel numb when she uses it a lot  ? ?More with twisting  ? ?Thought it looked puffy  ?Not tender to the touch  ?No redness /no heat  ? ? ?Is R handed  ?Had carpal tunnel years ago  ? ?Patient Active Problem List  ? Diagnosis Date Noted  ? Left wrist pain 11/13/2021  ? Family history of cancer 08/27/2020  ? History of breast cancer 08/27/2020  ? IDA (iron deficiency anemia) 08/27/2020  ? B12 deficiency 08/27/2020  ? Chronic kidney disease (CKD) stage G3b/A1, moderately decreased glomerular filtration rate (GFR) between 30-44 mL/min/1.73 square meter and albuminuria creatinine ratio less than 30 mg/g (HCC) 07/24/2020  ? Tachycardia 01/11/2019  ? Fatty liver 06/21/2018  ? Obesity (BMI 30-39.9) 06/21/2018  ? Dyspepsia 05/25/2018  ? GERD (gastroesophageal reflux disease) 05/25/2018  ? H/O vertigo 05/11/2018  ? Thrombocytosis 12/25/2017  ? Tear of medial meniscus of knee 12/21/2017  ? Left knee pain 09/21/2017  ? Welcome to Medicare preventive visit 12/17/2016  ? Grief reaction 12/17/2016  ? Estrogen deficiency 09/08/2016  ? Normocytic anemia 02/09/2013  ? Abnormal EKG 07/27/2012  ? Special screening for malignant neoplasms, colon 04/29/2011  ? Gynecological examination 04/29/2011  ? Routine general medical examination at a health care facility 04/20/2011  ? OSTEOARTHRITIS, HANDS, BILATERAL 10/31/2008  ? Diabetes type 2, controlled ([place]) 07/10/2008  ? Hyperlipidemia associated with type 2 diabetes mellitus ([place]) 01/26/2008  ? Essential hypertension 01/26/2008  ? ALLERGIC RHINITIS  12/07/2007  ? ?Past Medical History:  ?Diagnosis Date  ? Allergic rhinitis   ? Breast cancer ([place]) 1996  ? right breast  ? DM2 (diabetes mellitus, type 2) ([place])   ? GERD (gastroesophageal reflux disease)   ? Hx of cardiovascular stress test   ? a. Lex MV 2/14:  EF 69%, no ischemia  ? Hyperlipidemia   ? Hypertension   ? IDA (iron deficiency anemia) 08/27/2020  ? Iron deficiency anemia   ? Obesity   ? Osteoarthritis   ? hands  ? Ulcer   ? ?Past Surgical History:  ?Procedure Laterality Date  ? APPENDECTOMY  1976  ? BREAST LUMPECTOMY Right 1996  ? right breast, lumpectomy w/nodes  ? CATARACT EXTRACTION W/PHACO Left 11/28/2020  ? Procedure: CATARACT EXTRACTION PHACO AND INTRAOCULAR LENS PLACEMENT ([place]) LEFT DIABETIC;  Surgeon: Leandrew Koyanagi, MD;  Location: [place];  Service: Ophthalmology;  Laterality: Left;  5.02 ?01:07.4 ?7.4%  ? CATARACT EXTRACTION W/PHACO Right 12/12/2020  ? Procedure: CATARACT EXTRACTION PHACO AND INTRAOCULAR LENS PLACEMENT ([place]) RIGHT DIABETIC 5.22 00:59.2;  Surgeon: Leandrew Koyanagi, MD;  Location: [place];  Service: Ophthalmology;  Laterality: Right;  ? CHONDROPLASTY Left 02/10/2018  ? Procedure: CHONDROPLASTY;  Surgeon: Lovell Sheehan, MD;  Location: [facility];  Service: Orthopedics;  Laterality: Left;  ? GASTRIC RESTRICTION SURGERY    ? for reflux  ? HYSTEROSCOPY WITH D & C N/A 10/18/2012  ?  Procedure: DILATATION AND CURETTAGE /HYSTEROSCOPY;  Surgeon: Mora Bellman, MD;  Location: [facility];  Service: Gynecology;  Laterality: N/A;  ? KNEE ARTHROSCOPY WITH MEDIAL MENISECTOMY Left 02/10/2018  ? Procedure: KNEE ARTHROSCOPY WITH MEDIAL and LATERAL MENISECTOMY;  Surgeon: Lovell Sheehan, MD;  Location: [facility];  Service: Orthopedics;  Laterality: Left;  ? POLYPECTOMY N/A 10/18/2012  ? Procedure: POLYPECTOMY;  Surgeon: Mora Bellman, MD;  Location: [facility];  Service: Gynecology;  Laterality: N/A;  ? SYNOVECTOMY Left 02/10/2018  ? Procedure: SYNOVECTOMY;  Surgeon: Lovell Sheehan, MD;  Location: [facility];  Service: Orthopedics;  Laterality: Left;  ? ?Social History  ? ?Tobacco Use  ? Smoking status: Never  ? Smokeless tobacco: Never  ?Vaping Use  ? Vaping Use: Never used  ?Substance Use Topics  ? Alcohol use: Never  ?  Alcohol/week: 0.0 standard drinks  ? Drug use: No  ? ?Family History  ?Problem Relation Age of Onset  ? Diabetes Mother   ? Heart disease Mother   ?     Pacemaker, CHF  ? Hypertension Mother   ? Kidney cancer Mother   ? Stroke Father   ? CAD Father 34  ?     Died with MI  ? Esophageal cancer Brother   ? Heart disease Sister   ? Colon cancer Neg Hx   ? Rectal cancer Neg Hx   ? Stomach cancer Neg Hx   ? Pancreatic cancer Neg Hx   ? ?Allergies  ?Allergen Reactions  ? Lipitor [Atorvastatin] Other (See Comments)  ?  Muscle cramping  ? Niacin Nausea And Vomiting  ? Questran [Cholestyramine] Other (See Comments)  ?  REACTION: Muscles tightened up, drew up.  ? Zocor [Simvastatin]   ?  Muscular pain  ? ?Current Outpatient Medications on File Prior to Visit  ?Medication Sig Dispense Refill  ? Accu-Chek Softclix Lancets lancets TEST BLOOD SUGAR EVERY DAY FOR DIABETES 100 each 0  ? acetaminophen (TYLENOL) 500 MG tablet Take 1,000 mg by mouth every 6 (six) hours as needed for mild pain.     ? Alcohol Swabs (DROPSAFE ALCOHOL PREP) 70 % PADS USE ONE TIME DAILY AS DIRECTED WHEN CHECKING BLOOD SUGAR 100 each 0  ? amitriptyline (ELAVIL) 25 MG tablet Take 1 tablet (25 mg total) by mouth at bedtime. 90 tablet 3  ? aspirin 81 MG tablet Take 81 mg by mouth at bedtime.     ? Blood Glucose Calibration (ACCU-CHEK AVIVA) SOLN USE TO CALIBRATE METER AS DIRECTED 1 each 2  ? Blood Glucose Monitoring Suppl (ACCU-CHEK AVIVA PLUS) w/Device KIT Use to check blood sugar once daily for DM (dx. E11.9) 1 kit 0  ? calcium elemental as carbonate (BARIATRIC TUMS ULTRA) 400 MG chewable tablet Chew 1,000 mg by mouth daily as needed for heartburn.    ? Camphor-Menthol-Methyl Sal (SALONPAS) 3.07-19-08 % PTCH Apply  topically.    ? Cholecalciferol (VITAMIN D) 2000 units tablet Take 2,000 Units by mouth daily.    ? Cyanocobalamin (VITAMIN B-12) 1000 MCG SUBL Place 1 tablet (1,000 mcg total) under the tongue daily. 90 tablet 1  ? fenofibrate (TRICOR) 145 MG tablet TAKE 1 TABLET EVERY DAY 90 tablet 3  ? glipiZIDE (GLUCOTROL XL) 5 MG 24 hr tablet Take 1 tablet (5 mg total) by mouth daily with breakfast. 90 tablet 3  ? glucose blood (ACCU-CHEK AVIVA PLUS) test strip TEST BLOOD SUGAR EVERY DAY FOR DIABETES 100 strip 1  ? loratadine (CLARITIN) 10 MG tablet Take 10  mg by mouth daily as needed for allergies.    ? meclizine (ANTIVERT) 25 MG tablet Take 1 tablet (25 mg total) by mouth 3 (three) times daily as needed for dizziness. 90 tablet 3  ? metFORMIN (GLUCOPHAGE) 1000 MG tablet Take 0.5 tablets (500 mg total) by mouth 2 (two) times daily with a meal. 90 tablet 3  ? metoprolol succinate (TOPROL-XL) 50 MG 24 hr tablet Take 1 tablet (50 mg total) by mouth daily. Take with or immediately following a meal. 90 tablet 3  ? omeprazole (PRILOSEC) 40 MG capsule TAKE 1 CAPSULE IN THE MORNING AND AT BEDTIME. 180 capsule 1  ? ondansetron (ZOFRAN) 4 MG tablet TAKE 1 TABLET EVERY 4 HOURS AS NEEDED FOR NAUSEA AND VOMITING 90 tablet 0  ? SALINE NASAL SPRAY NA Place 1 spray into the nose daily as needed (congestion).     ? ?No current facility-administered medications on file prior to visit.  ?  ?Review of Systems  ?Constitutional:  Negative for activity change, appetite change, fatigue, fever and unexpected weight change.  ?HENT:  Negative for congestion, ear pain, rhinorrhea, sinus pressure and sore throat.   ?Eyes:  Negative for pain, redness and visual disturbance.  ?Respiratory:  Negative for cough, shortness of breath and wheezing.   ?Cardiovascular:  Negative for chest pain and palpitations.  ?Gastrointestinal:  Negative for abdominal pain, blood in stool, constipation and diarrhea.  ?Endocrine: Negative for polydipsia and polyuria.   ?Genitourinary:  Negative for dysuria, frequency and urgency.  ?Musculoskeletal:  Positive for arthralgias. Negative for back pain and myalgias.  ?     Left wrist pain   ?Skin:  Negative for pallor and rash.  ?Allergi

## 2021-11-13 NOTE — Addendum Note (Signed)
Addended by: Loura Pardon A on: 11/13/2021 08:21 PM ? ? Modules accepted: Orders ? ?

## 2021-11-21 ENCOUNTER — Other Ambulatory Visit: Payer: Self-pay | Admitting: Family Medicine

## 2021-11-28 ENCOUNTER — Encounter: Payer: Self-pay | Admitting: Family Medicine

## 2021-11-29 ENCOUNTER — Encounter: Payer: Self-pay | Admitting: *Deleted

## 2021-11-29 ENCOUNTER — Telehealth: Payer: Self-pay | Admitting: Family Medicine

## 2021-11-29 NOTE — Telephone Encounter (Signed)
Patient has called stating she needs a copy of her xrays to pick up for an appointment she has with her ortho doctor as soon as possible. Please call when she is able to do so.

## 2021-12-01 ENCOUNTER — Other Ambulatory Visit: Payer: Self-pay

## 2021-12-01 ENCOUNTER — Emergency Department: Payer: Medicare PPO

## 2021-12-01 ENCOUNTER — Emergency Department
Admission: EM | Admit: 2021-12-01 | Discharge: 2021-12-01 | Disposition: A | Discharge status: Home / Self Care | Payer: Medicare PPO | Attending: Emergency Medicine | Admitting: Emergency Medicine

## 2021-12-01 DIAGNOSIS — I6529 Occlusion and stenosis of unspecified carotid artery: Secondary | ICD-10-CM | POA: Diagnosis not present

## 2021-12-01 DIAGNOSIS — E860 Dehydration: Secondary | ICD-10-CM | POA: Diagnosis not present

## 2021-12-01 DIAGNOSIS — R4020 Unspecified coma: Secondary | ICD-10-CM | POA: Diagnosis not present

## 2021-12-01 DIAGNOSIS — R55 Syncope and collapse: Secondary | ICD-10-CM | POA: Insufficient documentation

## 2021-12-01 DIAGNOSIS — I10 Essential (primary) hypertension: Secondary | ICD-10-CM | POA: Diagnosis not present

## 2021-12-01 LAB — CBC
HCT: 38.6 % (ref 36.0–46.0)
Hemoglobin: 12.7 g/dL (ref 12.0–15.0)
MCH: 29 pg (ref 26.0–34.0)
MCHC: 32.9 g/dL (ref 30.0–36.0)
MCV: 88.1 fL (ref 80.0–100.0)
Platelets: 435 10*3/uL — ABNORMAL HIGH (ref 150–400)
RBC: 4.38 MIL/uL (ref 3.87–5.11)
RDW: 13.2 % (ref 11.5–15.5)
WBC: 8.3 10*3/uL (ref 4.0–10.5)
nRBC: 0 % (ref 0.0–0.2)

## 2021-12-01 LAB — URINALYSIS, ROUTINE W REFLEX MICROSCOPIC
Bilirubin Urine: NEGATIVE
Glucose, UA: NEGATIVE mg/dL
Ketones, ur: NEGATIVE mg/dL
Nitrite: NEGATIVE
Protein, ur: 30 mg/dL — AB
Specific Gravity, Urine: 1.019 (ref 1.005–1.030)
Squamous Epithelial / HPF: >50 — ABNORMAL HIGH (ref 0–5)
WBC, UA: >50 WBC/hpf — ABNORMAL HIGH (ref 0–5)
pH: 5 (ref 5.0–8.0)

## 2021-12-01 LAB — COMPREHENSIVE METABOLIC PANEL
ALT: 17 U/L (ref 0–44)
AST: 20 U/L (ref 15–41)
Albumin: 4.2 g/dL (ref 3.5–5.0)
Alkaline Phosphatase: 36 U/L — ABNORMAL LOW (ref 38–126)
Anion gap: 10 (ref 5–15)
BUN: 29 mg/dL — ABNORMAL HIGH (ref 8–23)
CO2: 26 mmol/L (ref 22–32)
Calcium: 10.2 mg/dL (ref 8.9–10.3)
Chloride: 104 mmol/L (ref 98–111)
Creatinine, Ser: 1.2 mg/dL — ABNORMAL HIGH (ref 0.44–1.00)
GFR, Estimated: 49 mL/min — ABNORMAL LOW (ref >60)
Glucose, Bld: 117 mg/dL — ABNORMAL HIGH (ref 70–99)
Potassium: 4.3 mmol/L (ref 3.5–5.1)
Sodium: 140 mmol/L (ref 135–145)
Total Bilirubin: 0.4 mg/dL (ref 0.3–1.2)
Total Protein: 8.3 g/dL — ABNORMAL HIGH (ref 6.5–8.1)

## 2021-12-01 LAB — TROPONIN I (HIGH SENSITIVITY)
Troponin I (High Sensitivity): 5 ng/L (ref <18)
Troponin I (High Sensitivity): 4 ng/L (ref <18)

## 2021-12-01 NOTE — Discharge Instructions (Signed)
We discussed admission versus going home but given your reassuring lab work-up I think it is reasonable to go home but if you develop return of symptoms please return to the ER immediately.  Otherwise you can follow-up with cardiology I placed referral for you.

## 2021-12-01 NOTE — ED Notes (Signed)
Pt states this has happened once before but she didn't get medical care then. States she has been eating and drinking normally.

## 2021-12-01 NOTE — ED Provider Notes (Signed)
[facility] Provider Note    Event Date/Time   First MD Initiated Contact with Patient 12/01/21 1957     (approximate)   History   Loss of Consciousness   HPI  Martha Hamilton is a 71 y.o. female who comes in with syncopal episode in the chair.  Patient did not fall out of the chair and was unresponsive for 10 minutes.  Positive orthostatic changes with EMS.  Patient was confused afterwards.  Denies any history of seizures.  Patient already received fluids with EMS.  According to patient she was sitting at a recital when she started to get really overheated.  She stated that she just wished the resettle was going to over when she started feeling really hot all over and her eyes went blurry and then she does not remember anything else.  Her sister-in-law sitting next to her who states that her eyes were closed she stated initially she thought for 10 minutes where she was unresponsive but then later stated that she actually thinks it was a lot less longer than that it just seemed forever.  There was some paramedics to help get her in her chair and they wheeled her outside and when she got outside of the open air she woke back up.  She denies falling on the ground did not hit her head.  She reports feeling great and feeling at her baseline self.  She does report 1 prior episode previously where she had some syncope related to being overheated.   Patient got 200 mL of fluid with EMS  Triage Vital Signs: ED Triage Vitals  Enc Vitals Group     BP 12/01/21 1733 (!) 104/46     Pulse Rate 12/01/21 1733 85     Resp 12/01/21 1733 18     Temp 12/01/21 1733 97.8 F (36.6 C)     Temp Source 12/01/21 1733 Oral     SpO2 12/01/21 1733 96 %     Weight --      Height --      Head Circumference --      Peak Flow --      Pain Score 12/01/21 1731 0     Pain Loc --      Pain Edu? --      Excl. in [place]? --     Most recent vital signs: Vitals:   12/01/21 1733  BP: (!)  104/46  Pulse: 85  Resp: 18  Temp: 97.8 F (36.6 C)  SpO2: 96%     General: Awake, no distress.  CV:  Good peripheral perfusion.  Resp:  Normal effort.  Abd:  No distention.  Soft and nontender Other:  Clear nerves II through XII are intact.  Equal strength in arms and legs.  Sensation intact.  Finger-nose intact bilaterally   ED Results / Procedures / Treatments   Labs (all labs ordered are listed, but only abnormal results are displayed) Labs Reviewed  CBC - Abnormal; Notable for the following components:      Result Value   Platelets 435 (*)    All other components within normal limits  URINALYSIS, ROUTINE W REFLEX MICROSCOPIC - Abnormal; Notable for the following components:   Color, Urine YELLOW (*)    APPearance CLOUDY (*)    Hgb urine dipstick MODERATE (*)    Protein, ur 30 (*)    Leukocytes,Ua LARGE (*)    WBC, UA >50 (*)    Bacteria, UA FEW (*)  Squamous Epithelial / LPF >50 (*)    All other components within normal limits  COMPREHENSIVE METABOLIC PANEL - Abnormal; Notable for the following components:   Glucose, Bld 117 (*)    BUN 29 (*)    Creatinine, Ser 1.20 (*)    Total Protein 8.3 (*)    Alkaline Phosphatase 36 (*)    GFR, Estimated 49 (*)    All other components within normal limits  CBG MONITORING, ED  TROPONIN I (HIGH SENSITIVITY)     EKG  My interpretation of EKG:  Normal sinus rate of 79 without any ST elevation or T wave versions, normal intervals  RADIOLOGY I have reviewed the CT head personally interpreted and do not see evidence of intercranial hemorrhage  PROCEDURES:  Critical Care performed: No  .1-3 Lead EKG Interpretation Performed by: Vanessa Canadian, MD Authorized by: Vanessa Numidia, MD     Interpretation: normal     ECG rate:  80   ECG rate assessment: normal     Rhythm: sinus rhythm     Ectopy: none     Conduction: normal     MEDICATIONS ORDERED IN ED: Medications - No data to display   IMPRESSION / MDM /  [place] / ED COURSE  I reviewed the triage vital signs and the nursing notes.  Patient came in with concern for syncope.  This concerning for acute life-threatening pathology with a differential including arrhythmia, ACS, dehydration.  No evidence of stroke on examination.  She denies any urinating on herself, tongue biting and no prolonged postictal to suggest seizure.  Cbc normal  Cmp normal kidney function  UA with squamous cells- will try to get repeat  Cardiac markers negative x2  Orthostatics are negative.  Patient ambulates without any symptoms.  Per patient's [facility] syncope rule patient is at negative therefore is low risk.  We discussed admission for cardiac monitoring and echocardiogram versus going home but she reports that she is had this happen previously when she overheated therefore she would strongly prefer to go home.  She does understand that there is a small risk for cardiac compromise that could lead to life-threatening event.  I have given her a cardiology referral and she understands that if she develops recurrent symptoms that she would need to return to the ER immediately.  But at this time patient would like to go home  The patient is on the cardiac monitor to evaluate for evidence of arrhythmia and/or significant heart rate changes.      FINAL CLINICAL IMPRESSION(S) / ED DIAGNOSES   Final diagnoses:  Syncope, unspecified syncope type     Rx / DC Orders   ED Discharge Orders          Ordered    Ambulatory referral to Cardiology       Comments: If you have not heard from the Cardiology office within the next 72 hours please call [phone].   12/01/21 2248             Note:  This document was prepared using Dragon voice recognition software and may include unintentional dictation errors.   Vanessa Crandall, MD 12/01/21 2249

## 2021-12-01 NOTE — ED Triage Notes (Signed)
First Nurse Note:  Pt via EMS, syncopal episode in the chair, did not fall, pt was unresponsive for about 10 mins. Pt had + orthostatic changes. States that she was confused a little after the episode. Denies hx of seizures. Denies pain. Denies head injury. Denies blood thinners. Pt has a hx of anemia   20G R hand. EMS gave 218m of fluid  80 HR 147/80 BP 97% on RA 117 CBG

## 2021-12-01 NOTE — ED Notes (Signed)
REDRAW OF BASIC LABS DONE AND SENT TO LAB

## 2021-12-02 DIAGNOSIS — S66812A Strain of other specified muscles, fascia and tendons at wrist and hand level, left hand, initial encounter: Secondary | ICD-10-CM | POA: Diagnosis not present

## 2021-12-02 DIAGNOSIS — M778 Other enthesopathies, not elsewhere classified: Secondary | ICD-10-CM | POA: Insufficient documentation

## 2021-12-02 DIAGNOSIS — S66819A Strain of other specified muscles, fascia and tendons at wrist and hand level, unspecified hand, initial encounter: Secondary | ICD-10-CM | POA: Insufficient documentation

## 2021-12-02 DIAGNOSIS — M67834 Other specified disorders of tendon, left wrist: Secondary | ICD-10-CM | POA: Diagnosis not present

## 2021-12-02 NOTE — Telephone Encounter (Signed)
[place] Night - Client Nonclinical Telephone Record  [facility] Client [facility] Night - Client Client Site [place] Provider Loura Pardon - MD Contact Type Call Who Is Calling Patient / Member / Family / Caregiver Caller Name Hurricane Phone Number [phone] Patient Name Martha Hamilton Patient DOB 16-Nov-1950 Call Type Message Only Information Provided Reason for Call Request for General Office Information Initial Comment Caller requesting copy of x-ray's to give to specialist. Additional Comment Provided office hours. Disp. Time Disposition Final User 12/02/2021 8:16:26 AM General Information Provided Yes Allena Napoleon Call Closed By: Allena Napoleon Transaction Date/Time: 12/02/2021 8:13:39 AM (ET

## 2021-12-04 ENCOUNTER — Inpatient Hospital Stay: Payer: Medicare PPO | Attending: Oncology

## 2021-12-04 DIAGNOSIS — E538 Deficiency of other specified B group vitamins: Secondary | ICD-10-CM | POA: Diagnosis not present

## 2021-12-04 DIAGNOSIS — D508 Other iron deficiency anemias: Secondary | ICD-10-CM

## 2021-12-04 MED ORDER — CYANOCOBALAMIN 1000 MCG/ML IJ SOLN
1000.0000 ug | Freq: Once | INTRAMUSCULAR | Status: AC
  Administered 2021-12-04: 1000 ug via INTRAMUSCULAR
  Filled 2021-12-04: qty 1

## 2021-12-09 ENCOUNTER — Telehealth: Payer: Self-pay | Admitting: Family Medicine

## 2021-12-09 DIAGNOSIS — N1832 Chronic kidney disease, stage 3b: Secondary | ICD-10-CM

## 2021-12-09 DIAGNOSIS — E1169 Type 2 diabetes mellitus with other specified complication: Secondary | ICD-10-CM

## 2021-12-09 DIAGNOSIS — E119 Type 2 diabetes mellitus without complications: Secondary | ICD-10-CM

## 2021-12-09 DIAGNOSIS — I10 Essential (primary) hypertension: Secondary | ICD-10-CM

## 2021-12-09 NOTE — Telephone Encounter (Signed)
-----   Message from Velna Hatchet, RT sent at 11/25/2021 11:25 AM EDT ----- Regarding: Lab Wed 12/11/21 Lab order needed for appt on 12/11/21, please.  Thanks, Anda Kraft

## 2021-12-11 ENCOUNTER — Other Ambulatory Visit (INDEPENDENT_AMBULATORY_CARE_PROVIDER_SITE_OTHER): Payer: Medicare PPO

## 2021-12-11 DIAGNOSIS — E119 Type 2 diabetes mellitus without complications: Secondary | ICD-10-CM

## 2021-12-11 DIAGNOSIS — E1169 Type 2 diabetes mellitus with other specified complication: Secondary | ICD-10-CM

## 2021-12-11 DIAGNOSIS — I10 Essential (primary) hypertension: Secondary | ICD-10-CM

## 2021-12-11 DIAGNOSIS — E785 Hyperlipidemia, unspecified: Secondary | ICD-10-CM | POA: Diagnosis not present

## 2021-12-11 DIAGNOSIS — N1832 Chronic kidney disease, stage 3b: Secondary | ICD-10-CM

## 2021-12-11 LAB — HEMOGLOBIN A1C: Hgb A1c MFr Bld: 6.3 % (ref 4.6–6.5)

## 2021-12-11 LAB — BASIC METABOLIC PANEL
BUN: 23 mg/dL (ref 6–23)
CO2: 24 mEq/L (ref 19–32)
Calcium: 9.4 mg/dL (ref 8.4–10.5)
Chloride: 103 mEq/L (ref 96–112)
Creatinine, Ser: 1.01 mg/dL (ref 0.40–1.20)
GFR: 56.26 mL/min — ABNORMAL LOW (ref >60.00)
Glucose, Bld: 132 mg/dL — ABNORMAL HIGH (ref 70–99)
Potassium: 4.2 mEq/L (ref 3.5–5.1)
Sodium: 139 mEq/L (ref 135–145)

## 2021-12-11 LAB — LIPID PANEL
Cholesterol: 176 mg/dL (ref 0–200)
HDL: 38.8 mg/dL — ABNORMAL LOW (ref >39.00)
NonHDL: 137.1
Total CHOL/HDL Ratio: 5
Triglycerides: 396 mg/dL — ABNORMAL HIGH (ref 0.0–149.0)
VLDL: 79.2 mg/dL — ABNORMAL HIGH (ref 0.0–40.0)

## 2021-12-11 LAB — LDL CHOLESTEROL, DIRECT: Direct LDL: 89 mg/dL

## 2021-12-18 ENCOUNTER — Encounter: Payer: Self-pay | Admitting: Family Medicine

## 2021-12-18 ENCOUNTER — Ambulatory Visit: Payer: Medicare PPO | Admitting: Family Medicine

## 2021-12-18 VITALS — BP 152/88 | HR 114 | Temp 98.0°F | Ht 59.0 in | Wt 167.2 lb

## 2021-12-18 DIAGNOSIS — E785 Hyperlipidemia, unspecified: Secondary | ICD-10-CM | POA: Diagnosis not present

## 2021-12-18 DIAGNOSIS — I10 Essential (primary) hypertension: Secondary | ICD-10-CM | POA: Diagnosis not present

## 2021-12-18 DIAGNOSIS — E1169 Type 2 diabetes mellitus with other specified complication: Secondary | ICD-10-CM

## 2021-12-18 DIAGNOSIS — N1832 Chronic kidney disease, stage 3b: Secondary | ICD-10-CM | POA: Diagnosis not present

## 2021-12-18 DIAGNOSIS — R55 Syncope and collapse: Secondary | ICD-10-CM

## 2021-12-18 DIAGNOSIS — R000 Tachycardia, unspecified: Secondary | ICD-10-CM

## 2021-12-18 DIAGNOSIS — E119 Type 2 diabetes mellitus without complications: Secondary | ICD-10-CM

## 2021-12-18 MED ORDER — LOSARTAN POTASSIUM 50 MG PO TABS
50.0000 mg | ORAL_TABLET | Freq: Every day | ORAL | 3 refills | Status: DC
Start: 2021-12-18 — End: 2022-09-01

## 2021-12-18 NOTE — Assessment & Plan Note (Signed)
bp is not at goal  BP Readings from Last 1 Encounters:  12/18/21 (!) 152/88   Off hctz and renal #s are better  Plan to re start losartan (unsure why she was off of the 50 mg daily)  Continue metoprolol xl 50 mg daily (afraid to inc this in light of recent syncope) For cardiology visit later this month Most recent labs reviewed  Disc lifstyle change with low sodium diet and exercise

## 2021-12-18 NOTE — Assessment & Plan Note (Signed)
Acute on chronic Taking metoprolol  For cardiol appt later this month

## 2021-12-18 NOTE — Progress Notes (Signed)
Subjective:    Patient ID: Martha Hamilton, female    DOB: 04/14/1951, 71 y.o.   MRN: [id number]  HPI Pt presents for f/u of DM2 and chronic health problems   Wt Readings from Last 3 Encounters:  12/18/21 167 lb 3.2 oz (75.8 kg)  11/13/21 167 lb 4 oz (75.9 kg)  11/04/21 166 lb (75.3 kg)   33.77 kg/m  Was seen for syncopal episode 5/21 in ER She had been sitting for over an hour watching a dance recital, then her eyes got blurry and she passed out    (This happened once before in the heat many years ago)  She declined admission and work up  Once she got over that it was ok   Feels ok overall  Legs are weak if she walks too long  Cannot tolerate the heat like she used to    Has a cardiology appt 12/3021 with Dr Garen Lah   Last stress test was normal years ago  She has had issues with high heart rate in the past   HTN bp is up today      Does not think her monitor is working well at home  No cp or palpitations or headaches or edema  No side effects to medicines  BP Readings from Last 3 Encounters:  12/18/21 (!) 152/88  12/01/21 (!) 146/87  11/13/21 (!) 162/94    Losartan 50 mg daily (off of this and does not know why)  Metoprolol xl 50 mg daily   Pulse Readings from Last 3 Encounters:  12/18/21 (!) 114  12/01/21 84  11/13/21 90   Pulse is high  She feels jittery (nothing to be stressed about)   Had to stop hctz in past due to renal insuff No ankle swelling   Lab Results  Component Value Date   CREATININE 1.01 12/11/2021   BUN 23 12/11/2021   NA 139 12/11/2021   K 4.2 12/11/2021   CL 103 12/11/2021   CO2 24 12/11/2021   Lab Results  Component Value Date   ALT 17 12/01/2021   AST 20 12/01/2021   ALKPHOS 36 (L) 12/01/2021   BILITOT 0.4 12/01/2021   GFR of 56.2 Improved from 49    DM2 Lab Results  Component Value Date   HGBA1C 6.3 12/11/2021   Down from 7.1 in feb Glipizide xl 5 mg daily (no low blood sugars and no highs)  Low dose  metformin 500 mg bid (no more due to renal insuff)  Diet is good/ for the most part no sweets (just apples)   Eye exam is scheduled   Lab Results  Component Value Date   MICROALBUR 1.1 04/29/2010   MICROALBUR 0.6 04/23/2009    Hyperlipidemia  Lab Results  Component Value Date   CHOL 176 12/11/2021   CHOL 191 09/09/2021   CHOL 218 (H) 07/19/2020   Lab Results  Component Value Date   HDL 38.80 (L) 12/11/2021   HDL 39.30 09/09/2021   HDL 36.60 (L) 07/19/2020   Lab Results  Component Value Date   LDLCALC 51 10/21/2011   LDLCALC 48 04/29/2010   Lab Results  Component Value Date   TRIG 396.0 (H) 12/11/2021   TRIG (H) 09/09/2021    455.0 Triglyceride is over 400; calculations on Lipids are invalid.   TRIG (H) 07/19/2020    497.0 Triglyceride is over 400; calculations on Lipids are invalid.   Lab Results  Component Value Date   CHOLHDL 5 12/11/2021  CHOLHDL 5 09/09/2021   CHOLHDL 6 07/19/2020   Lab Results  Component Value Date   LDLDIRECT 89.0 12/11/2021   LDLDIRECT 100.0 09/09/2021   LDLDIRECT 108.0 07/19/2020   Fenofibrate 145 mg daily - no missed doses  Intolerant of all statins tried   Diet is fair , she eats smaller portions now  Stays away from ham and bacon and greasy foods  Cooks with chicken/baked   Patient Active Problem List   Diagnosis Date Noted   Syncope 12/18/2021   Left wrist pain 11/13/2021   Family history of cancer 08/27/2020   History of breast cancer 08/27/2020   IDA (iron deficiency anemia) 08/27/2020   B12 deficiency 08/27/2020   Chronic kidney disease (CKD) stage G3b/A1, moderately decreased glomerular filtration rate (GFR) between 30-44 mL/min/1.73 square meter and albuminuria creatinine ratio less than 30 mg/g ([place]) 07/24/2020   Tachycardia 01/11/2019   Fatty liver 06/21/2018   Obesity (BMI 30-39.9) 06/21/2018   Dyspepsia 05/25/2018   GERD (gastroesophageal reflux disease) 05/25/2018   H/O vertigo 05/11/2018   Thrombocytosis  12/25/2017   Tear of medial meniscus of knee 12/21/2017   Left knee pain 09/21/2017   Welcome to Medicare preventive visit 12/17/2016   Grief reaction 12/17/2016   Estrogen deficiency 09/08/2016   Normocytic anemia 02/09/2013   Abnormal EKG 07/27/2012   Special screening for malignant neoplasms, colon 04/29/2011   Gynecological examination 04/29/2011   Routine general medical examination at a health care facility 04/20/2011   OSTEOARTHRITIS, HANDS, BILATERAL 10/31/2008   Diabetes type 2, controlled ([place]) 07/10/2008   Hyperlipidemia associated with type 2 diabetes mellitus ([place]) 01/26/2008   Essential hypertension 01/26/2008   ALLERGIC RHINITIS 12/07/2007   Past Medical History:  Diagnosis Date   Allergic rhinitis    Breast cancer ([place]) 1996   right breast   DM2 (diabetes mellitus, type 2) (HCC)    GERD (gastroesophageal reflux disease)    Hx of cardiovascular stress test    a. Lex MV 2/14:  EF 69%, no ischemia   Hyperlipidemia    Hypertension    IDA (iron deficiency anemia) 08/27/2020   Iron deficiency anemia    Obesity    Osteoarthritis    hands   Ulcer    Past Surgical History:  Procedure Laterality Date   APPENDECTOMY  1976   BREAST LUMPECTOMY Right 1996   right breast, lumpectomy w/nodes   CATARACT EXTRACTION W/PHACO Left 11/28/2020   Procedure: CATARACT EXTRACTION PHACO AND INTRAOCULAR LENS PLACEMENT ([place]) LEFT DIABETIC;  Surgeon: Leandrew Koyanagi, MD;  Location: [place];  Service: Ophthalmology;  Laterality: Left;  5.02 01:07.4 7.4%   CATARACT EXTRACTION W/PHACO Right 12/12/2020   Procedure: CATARACT EXTRACTION PHACO AND INTRAOCULAR LENS PLACEMENT ([facility]) RIGHT DIABETIC 5.22 00:59.2;  Surgeon: Leandrew Koyanagi, MD;  Location: [place];  Service: Ophthalmology;  Laterality: Right;   CHONDROPLASTY Left 02/10/2018   Procedure: CHONDROPLASTY;  Surgeon: Lovell Sheehan, MD;  Location: [facility];  Service: Orthopedics;  Laterality: Left;    GASTRIC RESTRICTION SURGERY     for reflux   HYSTEROSCOPY WITH D & C N/A 10/18/2012   Procedure: DILATATION AND CURETTAGE /HYSTEROSCOPY;  Surgeon: Mora Bellman, MD;  Location: [facility];  Service: Gynecology;  Laterality: N/A;   KNEE ARTHROSCOPY WITH MEDIAL MENISECTOMY Left 02/10/2018   Procedure: KNEE ARTHROSCOPY WITH MEDIAL and LATERAL MENISECTOMY;  Surgeon: Lovell Sheehan, MD;  Location: [facility];  Service: Orthopedics;  Laterality: Left;   POLYPECTOMY N/A 10/18/2012   Procedure: POLYPECTOMY;  Surgeon:  Mora Bellman, MD;  Location: [facility];  Service: Gynecology;  Laterality: N/A;   SYNOVECTOMY Left 02/10/2018   Procedure: SYNOVECTOMY;  Surgeon: Lovell Sheehan, MD;  Location: [facility];  Service: Orthopedics;  Laterality: Left;   Social History   Tobacco Use   Smoking status: Never   Smokeless tobacco: Never  Vaping Use   Vaping Use: Never used  Substance Use Topics   Alcohol use: Never    Alcohol/week: 0.0 standard drinks   Drug use: No   Family History  Problem Relation Age of Onset   Diabetes Mother    Heart disease Mother        Pacemaker, CHF   Hypertension Mother    Kidney cancer Mother    Stroke Father    CAD Father 77       Died with MI   Esophageal cancer Brother    Heart disease Sister    Colon cancer Neg Hx    Rectal cancer Neg Hx    Stomach cancer Neg Hx    Pancreatic cancer Neg Hx    Allergies  Allergen Reactions   Lipitor [Atorvastatin] Other (See Comments)    Muscle cramping   Niacin Nausea And Vomiting   Questran [Cholestyramine] Other (See Comments)    REACTION: Muscles tightened up, drew up.   Zocor [Simvastatin]     Muscular pain   Current Outpatient Medications on File Prior to Visit  Medication Sig Dispense Refill   Accu-Chek Softclix Lancets lancets TEST BLOOD SUGAR EVERY DAY FOR DIABETES 100 each 1   acetaminophen (TYLENOL) 500 MG tablet Take 1,000 mg by mouth every 6 (six) hours as needed for mild pain.      Alcohol Swabs (DROPSAFE ALCOHOL  PREP) 70 % PADS USE ONE TIME DAILY AS DIRECTED  WHEN  CHECKING BLOOD SUGAR 100 each 1   amitriptyline (ELAVIL) 25 MG tablet Take 1 tablet (25 mg total) by mouth at bedtime. 90 tablet 3   aspirin 81 MG tablet Take 81 mg by mouth at bedtime.      Blood Glucose Calibration (ACCU-CHEK AVIVA) SOLN USE TO CALIBRATE METER AS DIRECTED 1 each 2   Blood Glucose Monitoring Suppl (ACCU-CHEK AVIVA PLUS) w/Device KIT Use to check blood sugar once daily for DM (dx. E11.9) 1 kit 0   calcium elemental as carbonate (BARIATRIC TUMS ULTRA) 400 MG chewable tablet Chew 1,000 mg by mouth daily as needed for heartburn.     Camphor-Menthol-Methyl Sal (SALONPAS) 3.07-19-08 % PTCH Apply topically.     Cholecalciferol (VITAMIN D) 2000 units tablet Take 2,000 Units by mouth daily.     Cyanocobalamin (VITAMIN B-12) 1000 MCG SUBL Place 1 tablet (1,000 mcg total) under the tongue daily. 90 tablet 1   fenofibrate (TRICOR) 145 MG tablet TAKE 1 TABLET EVERY DAY 90 tablet 1   glipiZIDE (GLUCOTROL XL) 5 MG 24 hr tablet Take 1 tablet (5 mg total) by mouth daily with breakfast. 90 tablet 3   glucose blood (ACCU-CHEK AVIVA PLUS) test strip TEST BLOOD SUGAR EVERY DAY FOR DIABETES 100 strip 1   loratadine (CLARITIN) 10 MG tablet Take 10 mg by mouth daily as needed for allergies.     meclizine (ANTIVERT) 25 MG tablet Take 1 tablet (25 mg total) by mouth 3 (three) times daily as needed for dizziness. 90 tablet 3   metFORMIN (GLUCOPHAGE) 1000 MG tablet Take 0.5 tablets (500 mg total) by mouth 2 (two) times daily with a meal. 90 tablet 3   metoprolol succinate (  TOPROL-XL) 50 MG 24 hr tablet Take 1 tablet (50 mg total) by mouth daily. Take with or immediately following a meal. 90 tablet 3   omeprazole (PRILOSEC) 40 MG capsule TAKE 1 CAPSULE IN THE MORNING AND AT BEDTIME. 180 capsule 1   ondansetron (ZOFRAN) 4 MG tablet TAKE 1 TABLET EVERY 4 HOURS AS NEEDED FOR NAUSEA AND VOMITING 90 tablet 0   SALINE NASAL SPRAY NA Place 1 spray into the nose  daily as needed (congestion).      No current facility-administered medications on file prior to visit.      Review of Systems  Constitutional:  Positive for fatigue. Negative for activity change, appetite change, fever and unexpected weight change.  HENT:  Negative for congestion, ear pain, rhinorrhea, sinus pressure and sore throat.   Eyes:  Negative for pain, redness and visual disturbance.  Respiratory:  Negative for cough, shortness of breath and wheezing.   Cardiovascular:  Negative for chest pain and palpitations.  Gastrointestinal:  Negative for abdominal pain, blood in stool, constipation and diarrhea.  Endocrine: Negative for polydipsia and polyuria.  Genitourinary:  Negative for dysuria, frequency and urgency.  Musculoskeletal:  Negative for arthralgias, back pain and myalgias.  Skin:  Negative for pallor and rash.  Allergic/Immunologic: Negative for environmental allergies.  Neurological:  Positive for syncope. Negative for dizziness, seizures, light-headedness, numbness and headaches.       No more syncope after the episode  Legs feel weak at times   Hematological:  Negative for adenopathy. Does not bruise/bleed easily.  Psychiatric/Behavioral:  Negative for decreased concentration and dysphoric mood. The patient is not nervous/anxious.       Objective:   Physical Exam Constitutional:      General: She is not in acute distress.    Appearance: Normal appearance. She is well-developed. She is obese. She is not ill-appearing or diaphoretic.  HENT:     Head: Normocephalic and atraumatic.     Mouth/Throat:     Mouth: Mucous membranes are moist.  Eyes:     General: No scleral icterus.    Conjunctiva/sclera: Conjunctivae normal.     Pupils: Pupils are equal, round, and reactive to light.  Neck:     Thyroid: No thyromegaly.     Vascular: No carotid bruit or JVD.  Cardiovascular:     Rate and Rhythm: Regular rhythm. Tachycardia present.     Heart sounds: Normal heart  sounds.    No gallop.  Pulmonary:     Effort: Pulmonary effort is normal. No respiratory distress.     Breath sounds: Normal breath sounds. No wheezing or rales.  Abdominal:     General: There is no distension or abdominal bruit.     Palpations: Abdomen is soft.  Musculoskeletal:     Cervical back: Normal range of motion and neck supple.     Right lower leg: No edema.     Left lower leg: No edema.  Lymphadenopathy:     Cervical: No cervical adenopathy.  Skin:    General: Skin is warm and dry.     Coloration: Skin is not jaundiced or pale.     Findings: No bruising, erythema or rash.  Neurological:     Mental Status: She is alert.     Cranial Nerves: No cranial nerve deficit.     Motor: No weakness.     Coordination: Coordination normal.     Deep Tendon Reflexes: Reflexes are normal and symmetric. Reflexes normal.  Psychiatric:  Mood and Affect: Mood normal.          Assessment & Plan:   Problem List Items Addressed This Visit       Cardiovascular and Mediastinum   Essential hypertension    bp is not at goal  BP Readings from Last 1 Encounters:  12/18/21 (!) 152/88  Off hctz and renal #s are better  Plan to re start losartan (unsure why she was off of the 50 mg daily)  Continue metoprolol xl 50 mg daily (afraid to inc this in light of recent syncope) For cardiology visit later this month Most recent labs reviewed  Disc lifstyle change with low sodium diet and exercise        Relevant Medications   losartan (COZAAR) 50 MG tablet     Endocrine   Diabetes type 2, controlled ([place])    Well controlled Lab Results  Component Value Date   HGBA1C 6.3 12/11/2021  down from 7.1 Good diet  Metformin 500 mg bid  Glipizide xl 5 mg daily (no hypoglycemia) Eye exam is scheduled Starting arb Statin intolerant        Relevant Medications   losartan (COZAAR) 50 MG tablet   Hyperlipidemia associated with type 2 diabetes mellitus ([place])    Disc goals for lipids  and reasons to control them Rev last labs with pt Rev low sat fat diet in detail Fenofibrate helps trig some  396, down from 455 Intolerant of all statins (myopathy)      Relevant Medications   losartan (COZAAR) 50 MG tablet     Genitourinary   Chronic kidney disease (CKD) stage G3b/A1, moderately decreased glomerular filtration rate (GFR) between 30-44 mL/min/1.73 square meter and albuminuria creatinine ratio less than 30 mg/g ([facility])    Off hctz Labs reviewed  GFR is up to 56.2 Strongly enc to keep up fluids  Also avoid nsaids          Other   Syncope - Primary    One syncopal episode, seen in ER on 5/21 No further symptoms  Normal work up /pt declined admission and cardiology appt is scheduled for late this mo  No dizzy  No cp or sob  bp is elevated today-plan to re start losartan        Tachycardia    Acute on chronic Taking metoprolol  For cardiol appt later this month

## 2021-12-18 NOTE — Assessment & Plan Note (Signed)
Disc goals for lipids and reasons to control them Rev last labs with pt Rev low sat fat diet in detail Fenofibrate helps trig some  396, down from 455 Intolerant of all statins (myopathy)

## 2021-12-18 NOTE — Patient Instructions (Addendum)
If you get a new BP cuff , get OMRON for the arm   Eat healthy   Avoid excess salt and sugar   Continue current medicines  Add back losartan 50 mg once daily  I ordered it from mail order   See cardiology late June as planned  Let us know if you need anything   If you feel dizzy , don't drive and also let us know    Follow up here in 3 months

## 2021-12-18 NOTE — Assessment & Plan Note (Signed)
One syncopal episode, seen in ER on 5/21 No further symptoms  Normal work up /pt declined admission and cardiology appt is scheduled for late this mo  No dizzy  No cp or sob  bp is elevated today-plan to re start losartan

## 2021-12-18 NOTE — Assessment & Plan Note (Signed)
Off hctz Labs reviewed  GFR is up to 56.2 Strongly enc to keep up fluids  Also avoid nsaids

## 2021-12-18 NOTE — Assessment & Plan Note (Signed)
Well controlled Lab Results  Component Value Date   HGBA1C 6.3 12/11/2021  down from 7.1 Good diet  Metformin 500 mg bid  Glipizide xl 5 mg daily (no hypoglycemia) Eye exam is scheduled Starting arb Statin intolerant

## 2021-12-25 ENCOUNTER — Other Ambulatory Visit: Payer: Self-pay | Admitting: Family Medicine

## 2021-12-25 ENCOUNTER — Other Ambulatory Visit: Payer: Self-pay

## 2021-12-25 MED ORDER — OMEPRAZOLE 40 MG PO CPDR: DELAYED_RELEASE_CAPSULE | ORAL | 0 refills | Status: DC

## 2021-12-25 NOTE — Telephone Encounter (Signed)
Patient called wanted refill on omeprazole sent in. Was not given by you in the past. She is out and would like to have 2 weeks sent into local and long script sent into mail order.

## 2021-12-25 NOTE — Telephone Encounter (Signed)
Sent the short supply  Pended the long supply to send for mail order because I don't know which one  Please send to preferred mail order

## 2021-12-27 MED ORDER — OMEPRAZOLE 40 MG PO CPDR: DELAYED_RELEASE_CAPSULE | ORAL | 3 refills | Status: DC

## 2021-12-27 NOTE — Telephone Encounter (Signed)
Long Supply sent to [facility].

## 2021-12-31 DIAGNOSIS — M1712 Unilateral primary osteoarthritis, left knee: Secondary | ICD-10-CM | POA: Diagnosis not present

## 2021-12-31 DIAGNOSIS — M1732 Unilateral post-traumatic osteoarthritis, left knee: Secondary | ICD-10-CM | POA: Diagnosis not present

## 2022-01-03 ENCOUNTER — Inpatient Hospital Stay: Payer: Medicare PPO | Attending: Oncology

## 2022-01-03 DIAGNOSIS — D508 Other iron deficiency anemias: Secondary | ICD-10-CM

## 2022-01-03 DIAGNOSIS — E538 Deficiency of other specified B group vitamins: Secondary | ICD-10-CM | POA: Insufficient documentation

## 2022-01-03 MED ORDER — CYANOCOBALAMIN 1000 MCG/ML IJ SOLN
1000.0000 ug | Freq: Once | INTRAMUSCULAR | Status: AC
  Administered 2022-01-03: 1000 ug via INTRAMUSCULAR
  Filled 2022-01-03: qty 1

## 2022-01-10 ENCOUNTER — Ambulatory Visit (INDEPENDENT_AMBULATORY_CARE_PROVIDER_SITE_OTHER): Payer: Medicare PPO

## 2022-01-10 ENCOUNTER — Encounter: Payer: Self-pay | Admitting: Cardiology

## 2022-01-10 ENCOUNTER — Ambulatory Visit: Payer: Medicare PPO | Admitting: Cardiology

## 2022-01-10 VITALS — BP 164/84 | HR 105 | Ht 59.0 in | Wt 167.6 lb

## 2022-01-10 DIAGNOSIS — E781 Pure hyperglyceridemia: Secondary | ICD-10-CM | POA: Diagnosis not present

## 2022-01-10 DIAGNOSIS — R55 Syncope and collapse: Secondary | ICD-10-CM | POA: Diagnosis not present

## 2022-01-10 DIAGNOSIS — I10 Essential (primary) hypertension: Secondary | ICD-10-CM | POA: Diagnosis not present

## 2022-01-10 NOTE — Progress Notes (Signed)
Cardiology Office Note:    Date:  01/10/2022   ID:  Jamonica, Schoff 1951/06/26, MRN [id number]  PCP:  Abner Greenspan, MD   [place] Providers Cardiologist:  Kate Sable, MD     Referring MD: Vanessa [place], MD   Chief Complaint  Patient presents with   [facility] follow up due to syncope. Patient states that she is doing well. Meds reviewed with patient.     History of Present Illness:    Nariah Morgano is a 71 y.o. female with a hx of hypertension, hyperlipidemia, diabetes, CKD 3 presenting due to syncope.  Patient was sitting in chair at an auditorium watching her grandchild perform, when she passed out on 12/01/2021.  States feeling hot, and blurry vision, does not remember much else.  Family reports, she was unresponsive for about 10 minutes.  There was some paramedics at the auditorium that helped her to her wheelchair and she was taken outside.  Upon EMS arrival, patient appeared confused.  Was given IV fluids about 200 cc by EMS.  In the ED, initial BP was 104/46.  Work-up with EKG and cardiac troponins were unrevealing.  She states having similar symptoms when she got overheated about 2 to 3 years ago.  She was walking outside with family at a zoo.  Suddenly felt dizzy and sat down.  Denies losing consciousness.  She was taken to a cool area where there was some ventilation and felt better.  Denies any palpitations, chest pain or shortness of breath.  She has felt okay since then.  Followed up with PCP, blood pressures were elevated, restarted on losartan 50 mg daily.  She takes Toprol-XL due to history of elevated heart rates.   Past Medical History:  Diagnosis Date   Allergic rhinitis    Breast cancer ([place]) 1996   right breast   DM2 (diabetes mellitus, type 2) (HCC)    GERD (gastroesophageal reflux disease)    Hx of cardiovascular stress test    a. Lex MV 2/14:  EF 69%, no ischemia   Hyperlipidemia    Hypertension    IDA (iron  deficiency anemia) 08/27/2020   Iron deficiency anemia    Obesity    Osteoarthritis    hands   Ulcer     Past Surgical History:  Procedure Laterality Date   APPENDECTOMY  1976   BREAST LUMPECTOMY Right 1996   right breast, lumpectomy w/nodes   CATARACT EXTRACTION W/PHACO Left 11/28/2020   Procedure: CATARACT EXTRACTION PHACO AND INTRAOCULAR LENS PLACEMENT ([place]) LEFT DIABETIC;  Surgeon: Leandrew Koyanagi, MD;  Location: [place];  Service: Ophthalmology;  Laterality: Left;  5.02 01:07.4 7.4%   CATARACT EXTRACTION W/PHACO Right 12/12/2020   Procedure: CATARACT EXTRACTION PHACO AND INTRAOCULAR LENS PLACEMENT ([facility]) RIGHT DIABETIC 5.22 00:59.2;  Surgeon: Leandrew Koyanagi, MD;  Location: [facility];  Service: Ophthalmology;  Laterality: Right;   CHONDROPLASTY Left 02/10/2018   Procedure: CHONDROPLASTY;  Surgeon: Lovell Sheehan, MD;  Location: [facility];  Service: Orthopedics;  Laterality: Left;   GASTRIC RESTRICTION SURGERY     for reflux   HYSTEROSCOPY WITH D & C N/A 10/18/2012   Procedure: DILATATION AND CURETTAGE /HYSTEROSCOPY;  Surgeon: Mora Bellman, MD;  Location: [facility];  Service: Gynecology;  Laterality: N/A;   KNEE ARTHROSCOPY WITH MEDIAL MENISECTOMY Left 02/10/2018   Procedure: KNEE ARTHROSCOPY WITH MEDIAL and LATERAL MENISECTOMY;  Surgeon: Lovell Sheehan, MD;  Location: [facility];  Service: Orthopedics;  Laterality: Left;   POLYPECTOMY N/A 10/18/2012   Procedure: POLYPECTOMY;  Surgeon: Mora Bellman, MD;  Location: [facility];  Service: Gynecology;  Laterality: N/A;   SYNOVECTOMY Left 02/10/2018   Procedure: SYNOVECTOMY;  Surgeon: Lovell Sheehan, MD;  Location: [facility];  Service: Orthopedics;  Laterality: Left;    Current Medications: Current Meds  Medication Sig   Accu-Chek Softclix Lancets lancets TEST BLOOD SUGAR EVERY DAY FOR DIABETES   acetaminophen (TYLENOL) 500 MG tablet Take 1,000 mg by mouth every 6 (six) hours as needed for mild pain.    Alcohol  Swabs (DROPSAFE ALCOHOL PREP) 70 % PADS USE ONE TIME DAILY AS DIRECTED  WHEN  CHECKING BLOOD SUGAR   amitriptyline (ELAVIL) 25 MG tablet Take 1 tablet (25 mg total) by mouth at bedtime.   aspirin 81 MG tablet Take 81 mg by mouth at bedtime.    Blood Glucose Calibration (ACCU-CHEK AVIVA) SOLN USE TO CALIBRATE METER AS DIRECTED   Blood Glucose Monitoring Suppl (ACCU-CHEK AVIVA PLUS) w/Device KIT Use to check blood sugar once daily for DM (dx. E11.9)   calcium elemental as carbonate (BARIATRIC TUMS ULTRA) 400 MG chewable tablet Chew 1,000 mg by mouth daily as needed for heartburn.   Camphor-Menthol-Methyl Sal (SALONPAS) 3.07-19-08 % PTCH Apply topically.   Cholecalciferol (VITAMIN D) 2000 units tablet Take 2,000 Units by mouth daily.   Cyanocobalamin (VITAMIN B-12) 1000 MCG SUBL Place 1 tablet (1,000 mcg total) under the tongue daily.   Docusate Sodium (DSS) 100 MG CAPS Take 1 capsule by mouth as needed.   fenofibrate (TRICOR) 145 MG tablet TAKE 1 TABLET EVERY DAY   glipiZIDE (GLUCOTROL XL) 5 MG 24 hr tablet Take 1 tablet (5 mg total) by mouth daily with breakfast.   glucose blood (ACCU-CHEK AVIVA PLUS) test strip TEST BLOOD SUGAR EVERY DAY FOR DIABETES   loratadine (CLARITIN) 10 MG tablet Take 10 mg by mouth daily as needed for allergies.   losartan (COZAAR) 50 MG tablet Take 1 tablet (50 mg total) by mouth daily.   meclizine (ANTIVERT) 25 MG tablet Take 1 tablet (25 mg total) by mouth 3 (three) times daily as needed for dizziness.   metFORMIN (GLUCOPHAGE) 1000 MG tablet Take 0.5 tablets (500 mg total) by mouth 2 (two) times daily with a meal.   metoprolol succinate (TOPROL-XL) 50 MG 24 hr tablet Take 1 tablet (50 mg total) by mouth daily. Take with or immediately following a meal.   omeprazole (PRILOSEC) 40 MG capsule TAKE 1 CAPSULE IN THE MORNING AND AT BEDTIME.   ondansetron (ZOFRAN) 4 MG tablet TAKE 1 TABLET EVERY 4 HOURS AS NEEDED FOR NAUSEA AND VOMITING   SALINE NASAL SPRAY NA Place 1 spray  into the nose daily as needed (congestion).      Allergies:   Cholestyramine, Simvastatin, Lipitor [atorvastatin], and Niacin   Social History   Socioeconomic History   Marital status: Single    Spouse name: Not on file   Number of children: 0   Years of education: Not on file   Highest education level: Not on file  Occupational History   Occupation: retired    Fish farm manager: RETIRED  Tobacco Use   Smoking status: Never   Smokeless tobacco: Never  Vaping Use   Vaping Use: Never used  Substance and Sexual Activity   Alcohol use: Never    Alcohol/week: 0.0 standard drinks of alcohol   Drug use: No   Sexual activity: Not Currently    Birth control/protection: Post-menopausal  Other  Topics Concern   Not on file  Social History Narrative   Lives with, and cares for her elderly mother.   Social Determinants of Health   Financial Resource Strain: Low Risk  (01/17/2021)   Overall Financial Resource Strain (CARDIA)    Difficulty of Paying Living Expenses: Not hard at all  Food Insecurity: No Food Insecurity (01/17/2021)   Hunger Vital Sign    Worried About Running Out of Food in the Last Year: Never true    Ran Out of Food in the Last Year: Never true  Transportation Needs: No Transportation Needs (01/17/2021)   PRAPARE - Hydrologist (Medical): No    Lack of Transportation (Non-Medical): No  Physical Activity: Insufficiently Active (01/17/2021)   Exercise Vital Sign    Days of Exercise per Week: 7 days    Minutes of Exercise per Session: 20 min  Stress: No Stress Concern Present (01/17/2021)   [place]    Feeling of Stress : Not at all  Social Connections: Not on file     Family History: The patient's family history includes CAD (age of onset: 30) in her father; Diabetes in her mother; Esophageal cancer in her brother; Heart disease in her mother and sister; Hypertension in her mother;  Kidney cancer in her mother; Stroke in her father. There is no history of Colon cancer, Rectal cancer, Stomach cancer, or Pancreatic cancer.  ROS:   Please see the history of present illness.     All other systems reviewed and are negative.  EKGs/Labs/Other Studies Reviewed:    The following studies were reviewed today:   EKG:  EKG is  ordered today.  The ekg ordered today demonstrates sinus tachycardia, heart rate 105  Recent Labs: 09/09/2021: TSH 1.17 12/01/2021: ALT 17; Hemoglobin 12.7; Platelets 435 12/11/2021: BUN 23; Creatinine, Ser 1.01; Potassium 4.2; Sodium 139  Recent Lipid Panel    Component Value Date/Time   CHOL 176 12/11/2021 0754   TRIG 396.0 (H) 12/11/2021 0754   HDL 38.80 (L) 12/11/2021 0754   CHOLHDL 5 12/11/2021 0754   VLDL 79.2 (H) 12/11/2021 0754   LDLCALC 51 10/21/2011 0846   LDLDIRECT 89.0 12/11/2021 0754     Risk Assessment/Calculations:         Physical Exam:    VS:  BP (!) 164/84 (BP Location: Left Arm, Patient Position: Sitting, Cuff Size: Large)   Pulse (!) 105   Ht _0  (1.499 m)   Wt 167 lb 9.6 oz (76 kg)   LMP 06/13/2001   SpO2 94%   BMI 33.85 kg/m     Wt Readings from Last 3 Encounters:  01/10/22 167 lb 9.6 oz (76 kg)  12/18/21 167 lb 3.2 oz (75.8 kg)  11/13/21 167 lb 4 oz (75.9 kg)     GEN:  Well nourished, well developed in no acute distress HEENT: Normal NECK: No JVD; No carotid bruits CARDIAC: RRR, no murmurs, rubs, gallops RESPIRATORY:  Clear to auscultation without rales, wheezing or rhonchi  ABDOMEN: Soft, non-tender, non-distended MUSCULOSKELETAL:  No edema; No deformity  SKIN: Warm and dry NEUROLOGIC:  Alert and oriented x 3 PSYCHIATRIC:  Normal affect   ASSESSMENT:    1. Syncope and collapse   2. Primary hypertension   3. High triglycerides    PLAN:    In order of problems listed above:  Syncope, symptoms appear vasovagal with prodromal symptoms of dizziness and blurred vision.  Obtain echocardiogram,  place cardiac monitor to evaluate any arrhythmias.  Orthostatic vitals today with no evidence for orthostasis. Hypertension, may let BP run higher due to dizziness, hypotension on ED visit.  Due to tachycardia, consider increasing dose of Toprol-XL at follow-up visit.  Continue losartan 50 mg daily Elevated triglycerides, continue fenofibrate.  Has statin allergies, consider adding Zetia at follow-up visit.  Follow-up after echo and cardiac monitor       Medication Adjustments/Labs and Tests Ordered: Current medicines are reviewed at length with the patient today.  Concerns regarding medicines are outlined above.  Orders Placed This Encounter  Procedures   LONG TERM MONITOR (3-14 DAYS)   ECHOCARDIOGRAM COMPLETE   No orders of the defined types were placed in this encounter.   Patient Instructions  Medication Instructions:   Your physician recommends that you continue on your current medications as directed. Please refer to the Current Medication list given to you today.  *If you need a refill on your cardiac medications before your next appointment, please call your pharmacy*    Testing/Procedures:  Your physician has requested that you have an echocardiogram. Echocardiography is a painless test that uses sound waves to create images of your heart. It provides your doctor with information about the size and shape of your heart and how well your heart's chambers and valves are working. This procedure takes approximately one hour. There are no restrictions for this procedure.  Your physician has recommended that you wear a Zio XT monitor for 2 weeks. This will be mailed to your home address in 4-5 business days.   Your clinician has requested a Zio heart rhythm monitor by [employer] to be mailed to your home for you to wear for 14 days. You should expect a small box to arrive via USPS (or FedEx in some cases) within this next week. If you do not receive it please call [employer] at  [phone].  Closely watching your heart at this time will help your care team understand more and provide information needed to develop your plan of care.  Please apply your Zio patch monitor the day you receive it. Keep this packaging, you will use this to return your Zio monitor.  You will easily be able to apply the monitor with the instructions provided in the Patient Guide.  If you need assistance, [employer] representatives are available 24/7 at [phone].  You can also download the [facility] app on your phone to view detailed application instructions and log symptoms.  After you wear your monitor for 14 days, place it back in the blue box or envelope, along with your Symptom Log.  To send your monitor back: Simply use the pre-addressed and pre-paid box/envelope.  Send it back through [employer] the same day you remove it via your local post office or by placing it in your mailbox.  As soon as we receive the results, they will be reviewed and your clinician will contact you.  For the first 24 hours- it is essential to not shower or exercise, to allow the patch to adhere to your skin. Avoid excessive sweating to help maximize wear time. Do not submerge the device, no hot tubs, and no swimming pools. Keep any lotions or oils away from the patch. After 24 hours you may shower with the patch on. Take brief showers with your back facing the shower head.  Do not remove patch once it has been placed because that will interrupt data and decrease adhesive wear time. Push the button when  you have any symptoms and write down what you were feeling. Once you have completed wearing your monitor, remove and place into box which has postage paid and place in your outgoing mailbox.  If for some reason you have misplaced your box then call our office and we can provide another box and/or mail it off for you.    Follow-Up: At [facility], you and your health needs are our priority.  As part  of our continuing mission to provide you with exceptional heart care, we have created designated Provider Care Teams.  These Care Teams include your primary Cardiologist (physician) and Advanced Practice Providers (APPs -  Physician Assistants and Nurse Practitioners) who all work together to provide you with the care you need, when you need it.  We recommend signing up for the patient portal called "MyChart".  Sign up information is provided on this After Visit Summary.  MyChart is used to connect with patients for Virtual Visits (Telemedicine).  Patients are able to view lab/test results, encounter notes, upcoming appointments, etc.  Non-urgent messages can be sent to your provider as well.   To learn more about what you can do with MyChart, go to [email or web address].    Your next appointment:   6-8  week(s)  The format for your next appointment:   In Person  Provider:   You may see Kate Sable, MD or one of the following Advanced Practice Providers on your designated Care Team:   Murray Hodgkins, NP Christell Faith, PA-C Cadence Kathlen Mody, [place]    Other Instructions   Important Information About Sugar         Signed, Kate Sable, MD  01/10/2022 10:19 AM    [place]

## 2022-01-10 NOTE — Patient Instructions (Signed)
Medication Instructions:   Your physician recommends that you continue on your current medications as directed. Please refer to the Current Medication list given to you today.  *If you need a refill on your cardiac medications before your next appointment, please call your pharmacy*    Testing/Procedures:  Your physician has requested that you have an echocardiogram. Echocardiography is a painless test that uses sound waves to create images of your heart. It provides your doctor with information about the size and shape of your heart and how well your heart's chambers and valves are working. This procedure takes approximately one hour. There are no restrictions for this procedure.  Your physician has recommended that you wear a Zio XT monitor for 2 weeks. This will be mailed to your home address in 4-5 business days.   Your clinician has requested a Zio heart rhythm monitor by [employer] to be mailed to your home for you to wear for 14 days. You should expect a small box to arrive via USPS (or FedEx in some cases) within this next week. If you do not receive it please call [employer] at [phone].  Closely watching your heart at this time will help your care team understand more and provide information needed to develop your plan of care.  Please apply your Zio patch monitor the day you receive it. Keep this packaging, you will use this to return your Zio monitor.  You will easily be able to apply the monitor with the instructions provided in the Patient Guide.  If you need assistance, [employer] representatives are available 24/7 at [phone].  You can also download the [facility] app on your phone to view detailed application instructions and log symptoms.  After you wear your monitor for 14 days, place it back in the blue box or envelope, along with your Symptom Log.  To send your monitor back: Simply use the pre-addressed and pre-paid box/envelope.  Send it back through [employer] the  same day you remove it via your local post office or by placing it in your mailbox.  As soon as we receive the results, they will be reviewed and your clinician will contact you.  For the first 24 hours- it is essential to not shower or exercise, to allow the patch to adhere to your skin. Avoid excessive sweating to help maximize wear time. Do not submerge the device, no hot tubs, and no swimming pools. Keep any lotions or oils away from the patch. After 24 hours you may shower with the patch on. Take brief showers with your back facing the shower head.  Do not remove patch once it has been placed because that will interrupt data and decrease adhesive wear time. Push the button when you have any symptoms and write down what you were feeling. Once you have completed wearing your monitor, remove and place into box which has postage paid and place in your outgoing mailbox.  If for some reason you have misplaced your box then call our office and we can provide another box and/or mail it off for you.    Follow-Up: At [facility], you and your health needs are our priority.  As part of our continuing mission to provide you with exceptional heart care, we have created designated Provider Care Teams.  These Care Teams include your primary Cardiologist (physician) and Advanced Practice Providers (APPs -  Physician Assistants and Nurse Practitioners) who all work together to provide you with the care you need, when you need it.  We recommend signing up for the patient portal called "MyChart".  Sign up information is provided on this After Visit Summary.  MyChart is used to connect with patients for Virtual Visits (Telemedicine).  Patients are able to view lab/test results, encounter notes, upcoming appointments, etc.  Non-urgent messages can be sent to your provider as well.   To learn more about what you can do with MyChart, go to [email or web address].    Your next appointment:   6-8   week(s)  The format for your next appointment:   In Person  Provider:   You may see Kate Sable, MD or one of the following Advanced Practice Providers on your designated Care Team:   Murray Hodgkins, NP Christell Faith, PA-C Cadence Kathlen Mody, [place]    Other Instructions   Important Information About Sugar

## 2022-01-13 DIAGNOSIS — M1732 Unilateral post-traumatic osteoarthritis, left knee: Secondary | ICD-10-CM | POA: Diagnosis not present

## 2022-01-13 DIAGNOSIS — R55 Syncope and collapse: Secondary | ICD-10-CM | POA: Diagnosis not present

## 2022-01-13 DIAGNOSIS — M1712 Unilateral primary osteoarthritis, left knee: Secondary | ICD-10-CM | POA: Diagnosis not present

## 2022-01-16 NOTE — Addendum Note (Signed)
Addended by: James Ivanoff D on: 01/16/2022 10:55 AM   Modules accepted: Orders

## 2022-01-20 ENCOUNTER — Ambulatory Visit (INDEPENDENT_AMBULATORY_CARE_PROVIDER_SITE_OTHER): Payer: Medicare PPO

## 2022-01-20 VITALS — Wt 167.0 lb

## 2022-01-20 DIAGNOSIS — Z0000 Encounter for general adult medical examination without abnormal findings: Secondary | ICD-10-CM

## 2022-01-20 NOTE — Progress Notes (Signed)
Virtual Visit via Telephone Note  I connected with  Martha Hamilton on 01/20/22 at  1:30 PM EDT by telephone and verified that I am speaking with the correct person using two identifiers.  Location: Patient: home Provider: Gary Persons participating in the virtual visit: [facility]   I discussed the limitations, risks, security and privacy concerns of performing an evaluation and management service by telephone and the availability of in person appointments. The patient expressed understanding and agreed to proceed.  Interactive audio and video telecommunications were attempted between this nurse and patient, however failed, due to patient having technical difficulties OR patient did not have access to video capability.  We continued and completed visit with audio only.  Some vital signs may be absent or patient reported.   Dionisio David, LPN  Subjective:   Martha Hamilton is a 71 y.o. female who presents for Medicare Annual (Subsequent) preventive examination.  Review of Systems     Cardiac Risk Factors include: advanced age (>50mn, >>52women);diabetes mellitus;hypertension     Objective:    There were no vitals filed for this visit. There is no height or weight on file to calculate BMI.     01/20/2022    1:30 PM 12/01/2021    5:32 PM 11/04/2021    1:58 PM 08/05/2021    1:20 PM 05/13/2021    1:00 PM 01/17/2021    1:17 PM 12/12/2020    7:03 AM  Advanced Directives  Does Patient Have a Medical Advance Directive? Yes No Yes Yes Yes Yes Yes  Type of AParamed[place]of [place] Village[place] will  Healthcare Power of [place] will  [place] will [place] will  Does patient want to make changes to medical advance directive? Yes (Inpatient - patient defers changing a medical advance directive and declines information at this time)    No - Patient  declined  No - Patient declined  Copy of [place] Chart? Yes - validated most recent copy scanned in chart (See row information)     Yes - validated most recent copy scanned in chart (See row information) No - copy requested    Current Medications (verified) Outpatient Encounter Medications as of 01/20/2022  Medication Sig   Accu-Chek Softclix Lancets lancets TEST BLOOD SUGAR EVERY DAY FOR DIABETES   acetaminophen (TYLENOL) 500 MG tablet Take 1,000 mg by mouth every 6 (six) hours as needed for mild pain.    Alcohol Swabs (DROPSAFE ALCOHOL PREP) 70 % PADS USE ONE TIME DAILY AS DIRECTED  WHEN  CHECKING BLOOD SUGAR   amitriptyline (ELAVIL) 25 MG tablet Take 1 tablet (25 mg total) by mouth at bedtime.   aspirin 81 MG tablet Take 81 mg by mouth at bedtime.    Blood Glucose Calibration (ACCU-CHEK AVIVA) SOLN USE TO CALIBRATE METER AS DIRECTED   Blood Glucose Monitoring Suppl (ACCU-CHEK AVIVA PLUS) w/Device KIT Use to check blood sugar once daily for DM (dx. E11.9)   calcium elemental as carbonate (BARIATRIC TUMS ULTRA) 400 MG chewable tablet Chew 1,000 mg by mouth daily as needed for heartburn.   Camphor-Menthol-Methyl Sal (SALONPAS) 3.07-19-08 % PTCH Apply topically.   celecoxib (CELEBREX) 200 MG capsule    Cholecalciferol (VITAMIN D) 2000 units tablet Take 2,000 Units by mouth daily.   Cyanocobalamin (VITAMIN B-12) 1000 MCG SUBL Place 1 tablet (1,000 mcg total) under the tongue daily.   Docusate Sodium (DSS)  100 MG CAPS Take 1 capsule by mouth as needed.   fenofibrate (TRICOR) 145 MG tablet TAKE 1 TABLET EVERY DAY   glipiZIDE (GLUCOTROL XL) 5 MG 24 hr tablet Take 1 tablet (5 mg total) by mouth daily with breakfast.   glucose blood (ACCU-CHEK AVIVA PLUS) test strip TEST BLOOD SUGAR EVERY DAY FOR DIABETES   loratadine (CLARITIN) 10 MG tablet Take 10 mg by mouth daily as needed for allergies.   losartan (COZAAR) 50 MG tablet Take 1 tablet (50 mg total) by mouth daily.    meclizine (ANTIVERT) 25 MG tablet Take 1 tablet (25 mg total) by mouth 3 (three) times daily as needed for dizziness.   metFORMIN (GLUCOPHAGE) 1000 MG tablet Take 0.5 tablets (500 mg total) by mouth 2 (two) times daily with a meal.   metoprolol succinate (TOPROL-XL) 50 MG 24 hr tablet Take 1 tablet (50 mg total) by mouth daily. Take with or immediately following a meal.   omeprazole (PRILOSEC) 40 MG capsule TAKE 1 CAPSULE IN THE MORNING AND AT BEDTIME.   ondansetron (ZOFRAN) 4 MG tablet TAKE 1 TABLET EVERY 4 HOURS AS NEEDED FOR NAUSEA AND VOMITING   SALINE NASAL SPRAY NA Place 1 spray into the nose daily as needed (congestion).    No facility-administered encounter medications on file as of 01/20/2022.    Allergies (verified) Cholestyramine, Simvastatin, Lipitor [atorvastatin], and Niacin   History: Past Medical History:  Diagnosis Date   Allergic rhinitis    Breast cancer ([place]) 1996   right breast   DM2 (diabetes mellitus, type 2) (HCC)    GERD (gastroesophageal reflux disease)    Hx of cardiovascular stress test    a. Lex MV 2/14:  EF 69%, no ischemia   Hyperlipidemia    Hypertension    IDA (iron deficiency anemia) 08/27/2020   Iron deficiency anemia    Obesity    Osteoarthritis    hands   Ulcer    Past Surgical History:  Procedure Laterality Date   APPENDECTOMY  1976   BREAST LUMPECTOMY Right 1996   right breast, lumpectomy w/nodes   CATARACT EXTRACTION W/PHACO Left 11/28/2020   Procedure: CATARACT EXTRACTION PHACO AND INTRAOCULAR LENS PLACEMENT ([place]) LEFT DIABETIC;  Surgeon: Leandrew Koyanagi, MD;  Location: [facility];  Service: Ophthalmology;  Laterality: Left;  5.02 01:07.4 7.4%   CATARACT EXTRACTION W/PHACO Right 12/12/2020   Procedure: CATARACT EXTRACTION PHACO AND INTRAOCULAR LENS PLACEMENT ([facility]) RIGHT DIABETIC 5.22 00:59.2;  Surgeon: Leandrew Koyanagi, MD;  Location: [facility];  Service: Ophthalmology;  Laterality: Right;   CHONDROPLASTY Left  02/10/2018   Procedure: CHONDROPLASTY;  Surgeon: Lovell Sheehan, MD;  Location: [facility];  Service: Orthopedics;  Laterality: Left;   GASTRIC RESTRICTION SURGERY     for reflux   HYSTEROSCOPY WITH D & C N/A 10/18/2012   Procedure: DILATATION AND CURETTAGE /HYSTEROSCOPY;  Surgeon: Mora Bellman, MD;  Location: [facility];  Service: Gynecology;  Laterality: N/A;   KNEE ARTHROSCOPY WITH MEDIAL MENISECTOMY Left 02/10/2018   Procedure: KNEE ARTHROSCOPY WITH MEDIAL and LATERAL MENISECTOMY;  Surgeon: Lovell Sheehan, MD;  Location: [facility];  Service: Orthopedics;  Laterality: Left;   POLYPECTOMY N/A 10/18/2012   Procedure: POLYPECTOMY;  Surgeon: Mora Bellman, MD;  Location: [facility];  Service: Gynecology;  Laterality: N/A;   SYNOVECTOMY Left 02/10/2018   Procedure: SYNOVECTOMY;  Surgeon: Lovell Sheehan, MD;  Location: [facility];  Service: Orthopedics;  Laterality: Left;   Family History  Problem Relation Age of Onset  Diabetes Mother    Heart disease Mother        Pacemaker, CHF   Hypertension Mother    Kidney cancer Mother    Stroke Father    CAD Father 49       Died with MI   Esophageal cancer Brother    Heart disease Sister    Colon cancer Neg Hx    Rectal cancer Neg Hx    Stomach cancer Neg Hx    Pancreatic cancer Neg Hx    Social History   Socioeconomic History   Marital status: Single    Spouse name: Not on file   Number of children: 0   Years of education: Not on file   Highest education level: Not on file  Occupational History   Occupation: retired    Fish farm manager: RETIRED  Tobacco Use   Smoking status: Never   Smokeless tobacco: Never  Vaping Use   Vaping Use: Never used  Substance and Sexual Activity   Alcohol use: Never    Alcohol/week: 0.0 standard drinks of alcohol   Drug use: No   Sexual activity: Not Currently    Birth control/protection: Post-menopausal  Other Topics Concern   Not on file  Social History Narrative   Lives with, and cares for her elderly mother.    Social Determinants of Health   Financial Resource Strain: Low Risk  (01/20/2022)   Overall Financial Resource Strain (CARDIA)    Difficulty of Paying Living Expenses: Not hard at all  Food Insecurity: No Food Insecurity (01/20/2022)   Hunger Vital Sign    Worried About Running Out of Food in the Last Year: Never true    Ran Out of Food in the Last Year: Never true  Transportation Needs: No Transportation Needs (01/20/2022)   PRAPARE - Hydrologist (Medical): No    Lack of Transportation (Non-Medical): No  Physical Activity: Insufficiently Active (01/20/2022)   Exercise Vital Sign    Days of Exercise per Week: 2 days    Minutes of Exercise per Session: 20 min  Stress: No Stress Concern Present (01/20/2022)   [place]    Feeling of Stress : Not at all  Social Connections: Moderately Isolated (01/20/2022)   Social Connection and Isolation Panel [NHANES]    Frequency of Communication with Friends and Family: More than three times a week    Frequency of Social Gatherings with Friends and Family: More than three times a week    Attends Religious Services: More than 4 times per year    Active Member of [employer] or Organizations: No    Attends Music therapist: Never    Marital Status: Never married    Tobacco Counseling Counseling given: Not Answered   Clinical Intake:  Pre-visit preparation completed: Yes        Nutritional Risks: None Diabetes: Yes CBG done?: No Did pt. bring in CBG monitor from home?: No  How often do you need to have someone help you when you read instructions, pamphlets, or other written materials from your doctor or pharmacy?: 1 - Never  Diabetic?yes Nutrition Risk Assessment:  Has the patient had any N/V/D within the last 2 months?  No  Does the patient have any non-healing wounds?  No  Has the patient had any unintentional weight loss or  weight gain?  No   Diabetes:  Is the patient diabetic?  Yes  If diabetic, was a CBG  obtained today?  No  Did the patient bring in their glucometer from home?  No  How often do you monitor your CBG's? Once per day  Financial Strains and Diabetes Management:  Are you having any financial strains with the device, your supplies or your medication? No .  Does the patient want to be seen by Chronic Care Management for management of their diabetes?  No  Would the patient like to be referred to a Nutritionist or for Diabetic Management?  No   Diabetic Exams:  Diabetic Eye Exam: Completed 10/23/21. Pt has been advised about the importance in completing this exam.   Diabetic Foot Exam: Completed 09/17/21. Pt has been advised about the importance in completing this exam.  Interpreter Needed?: No  Information entered by :: Kirke Shaggy, LPN   Activities of Daily Living    01/20/2022    1:32 PM  In your present state of health, do you have any difficulty performing the following activities:  Hearing? 0  Vision? 0  Difficulty concentrating or making decisions? 0  Walking or climbing stairs? 1  Dressing or bathing? 0  Doing errands, shopping? 0  Preparing Food and eating ? N  Using the Toilet? N  In the past six months, have you accidently leaked urine? N  Do you have problems with loss of bowel control? N  Managing your Medications? N  Managing your Finances? N  Housekeeping or managing your Housekeeping? N    Patient Care Team: [place], Wynelle Fanny, MD as PCP - General Kate Sable, MD as PCP - Cardiology (Cardiology) [name], [facility] as Pharmacist (Pharmacist)  Indicate any recent Medical Services you may have received from other than [facility] providers in the past year (date may be approximate).     Assessment:   This is a routine wellness examination for [facility].  Hearing/Vision screen Hearing Screening - Comments:: No aids Vision Screening - Comments:: Readers- [facility]  Dietary issues and exercise activities discussed: Current Exercise Habits: Home exercise routine, Type of exercise: walking, Time (Minutes): 20, Frequency (Times/Week): 3, Weekly Exercise (Minutes/Week): 60, Intensity: Mild   Goals Addressed             This Visit's Progress    DIET - EAT MORE FRUITS AND VEGETABLES         Depression Screen    01/20/2022    1:27 PM 12/18/2021   10:35 AM 01/17/2021    1:18 PM 01/13/2020   10:00 AM 01/10/2019    8:29 AM 12/21/2017    9:01 AM 03/01/2014    5:07 PM  PHQ 2/9 Scores  PHQ - 2 Score 0 0 0 0 0 0 0  PHQ- 9 Score   0 0 0 0     Fall Risk    01/20/2022    1:31 PM 12/18/2021   10:34 AM 01/17/2021    1:18 PM 01/13/2020    9:58 AM 01/10/2019    8:29 AM  Fall Risk   Falls in the past year? 1 0 0 0 0  Number falls in past yr: 0 0 0 0   Injury with Fall? 0 1 0 0   Risk for fall due to : History of fall(s)  Medication side effect Medication side effect   Follow up Falls prevention discussed  Falls evaluation completed;Falls prevention discussed Falls evaluation completed;Falls prevention discussed     FALL RISK PREVENTION PERTAINING TO THE HOME:  Any stairs in or around the home? Yes  If so,  are there any without handrails? No  Home free of loose throw rugs in walkways, pet beds, electrical cords, etc? Yes  Adequate lighting in your home to reduce risk of falls? Yes   ASSISTIVE DEVICES UTILIZED TO PREVENT FALLS:  Life alert? No  Use of a cane, walker or w/c? Yes  Grab bars in the bathroom? Yes  Shower chair or bench in shower? Yes  Elevated toilet seat or a handicapped toilet? No    Cognitive Function:    01/17/2021    1:20 PM 01/13/2020   10:05 AM 01/10/2019    8:29 AM 12/21/2017    9:00 AM  MMSE - Mini Mental State Exam  Orientation to time 5 5 5 5   Orientation to Place 5 5 5 5   Registration 3 3 3 3   Attention/ Calculation 5 5 0 0  Recall 3 3 3 3   Language- name 2 objects   0 0  Language- repeat 1 1 1 1   Language- follow 3  step command   0 3  Language- read & follow direction   0 0  Write a sentence   0 0  Copy design   0 0  Total score   17 20        01/20/2022    1:37 PM  6CIT Screen  What Year? 0 points  What month? 0 points  What time? 0 points  Count back from 20 0 points  Months in reverse 0 points  Repeat phrase 0 points  Total Score 0 points    Immunizations Immunization History  Administered Date(s) Administered   Fluad Quad(high Dose 65+) 03/10/2019, 03/29/2020   Influenza Split 04/09/2011, 04/01/2012   Influenza Whole 04/23/2007, 04/10/2008, 04/10/2009, 04/02/2010   Influenza,inj,Quad PF,6+ Mos 03/23/2013, 03/29/2014, 03/13/2015, 04/10/2016, 03/25/2017, 04/08/2018   PFIZER(Purple Top)SARS-COV-2 Vaccination 09/05/2019, 09/26/2019, 05/26/2020   Pneumococcal Conjugate-13 02/08/2016   Pneumococcal Polysaccharide-23 04/13/2008, 05/16/2013, 02/26/2017   Td 02/09/2009   Zoster, Live 01/28/2011    TDAP status: Due, Education has been provided regarding the importance of this vaccine. Advised may receive this vaccine at local pharmacy or Health Dept. Aware to provide a copy of the vaccination record if obtained from local pharmacy or Health Dept. Verbalized acceptance and understanding.  Flu Vaccine status: Declined, Education has been provided regarding the importance of this vaccine but patient still declined. Advised may receive this vaccine at local pharmacy or Health Dept. Aware to provide a copy of the vaccination record if obtained from local pharmacy or Health Dept. Verbalized acceptance and understanding.  Pneumococcal vaccine status: Up to date  Covid-19 vaccine status: Completed vaccines  Qualifies for Shingles Vaccine? Yes   Zostavax completed Yes   Shingrix Completed?: Yes  Screening Tests Health Maintenance  Topic Date Due   Hepatitis C Screening  Never done   Zoster Vaccines- Shingrix (1 of 2) Never done   COVID-19 Vaccine (4 - Pfizer series) 07/21/2020   TETANUS/TDAP   04/12/2029 (Originally 02/10/2019)   INFLUENZA VACCINE  02/11/2022   COLON CANCER SCREENING ANNUAL FOBT  05/01/2022   MAMMOGRAM  05/09/2022   HEMOGLOBIN A1C  06/12/2022   FOOT EXAM  09/18/2022   OPHTHALMOLOGY EXAM  10/24/2022   COLONOSCOPY (Pts 45-83yr Insurance coverage will need to be confirmed)  05/01/2028   Pneumonia Vaccine 71 Years old  Completed   DEXA SCAN  Completed   HPV VACCINES  Aged Out    Health Maintenance  Health Maintenance Due  Topic Date Due   Hepatitis C Screening  Never done   Zoster Vaccines- Shingrix (1 of 2) Never done   COVID-19 Vaccine (4 - Pfizer series) 07/21/2020    Colorectal cancer screening: Type of screening: Colonoscopy. Completed 05/01/21. Repeat every 7 years  Mammogram status: Completed 05/09/21. Repeat every year  Bone Density status: Completed 05/08/20. Results reflect: Bone density results: NORMAL. Repeat every 5 years.  Lung Cancer Screening: (Low Dose CT Chest recommended if Age 13-80 years, 30 pack-year currently smoking OR have quit w/in 15years.) does not qualify.   Additional Screening:  Hepatitis C Screening: does qualify; Completed no  Vision Screening: Recommended annual ophthalmology exams for early detection of glaucoma and other disorders of the eye. Is the patient up to date with their annual eye exam?  Yes  Who is the provider or what is the name of the office in which the patient attends annual eye exams? [facility] If pt is not established with a provider, would they like to be referred to a provider to establish care? No .   Dental Screening: Recommended annual dental exams for proper oral hygiene  Community Resource Referral / Chronic Care Management: CRR required this visit?  No   CCM required this visit?  No      Plan:     I have personally reviewed and noted the following in the patient's chart:   Medical and social history Use of alcohol, tobacco or illicit drugs  Current medications and  supplements including opioid prescriptions.  Functional ability and status Nutritional status Physical activity Advanced directives List of other physicians Hospitalizations, surgeries, and ER visits in previous 12 months Vitals Screenings to include cognitive, depression, and falls Referrals and appointments  In addition, I have reviewed and discussed with patient certain preventive protocols, quality metrics, and best practice recommendations. A written personalized care plan for preventive services as well as general preventive health recommendations were provided to patient.     Dionisio David, LPN   [id number]   Nurse Notes: none

## 2022-01-20 NOTE — Patient Instructions (Signed)
Martha Hamilton , Thank you for taking time to come for your Medicare Wellness Visit. I appreciate your ongoing commitment to your health goals. Please review the following plan we discussed and let me know if I can assist you in the future.   Screening recommendations/referrals: Colonoscopy: 05/01/21 Mammogram: 05/09/21 Bone Density: 05/08/20 Recommended yearly ophthalmology/optometry visit for glaucoma screening and checkup Recommended yearly dental visit for hygiene and checkup  Vaccinations: Influenza vaccine: n/d Pneumococcal vaccine: 02/26/17 Tdap vaccine: 02/09/09 Shingles vaccine: Shingrix 09/25/21, 12/26/21  Zostavax 01/28/11   Covid-19:09/05/19, 09/26/19, 05/26/20  Advanced directives: yes  Conditions/risks identified: no  Next appointment: Follow up in one year for your annual wellness visit 01/22/23 @ 1:15 pm by phone   Preventive Care 65 Years and Older, Female Preventive care refers to lifestyle choices and visits with your health care provider that can promote health and wellness. What does preventive care include? A yearly physical exam. This is also called an annual well check. Dental exams once or twice a year. Routine eye exams. Ask your health care provider how often you should have your eyes checked. Personal lifestyle choices, including: Daily care of your teeth and gums. Regular physical activity. Eating a healthy diet. Avoiding tobacco and drug use. Limiting alcohol use. Practicing safe sex. Taking low-dose aspirin every day. Taking vitamin and mineral supplements as recommended by your health care provider. What happens during an annual well check? The services and screenings done by your health care provider during your annual well check will depend on your age, overall health, lifestyle risk factors, and family history of disease. Counseling  Your health care provider may ask you questions about your: Alcohol use. Tobacco use. Drug use. Emotional  well-being. Home and relationship well-being. Sexual activity. Eating habits. History of falls. Memory and ability to understand (cognition). Work and work Statistician. Reproductive health. Screening  You may have the following tests or measurements: Height, weight, and BMI. Blood pressure. Lipid and cholesterol levels. These may be checked every 5 years, or more frequently if you are over 71 years old. Skin check. Lung cancer screening. You may have this screening every year starting at age 31 if you have a 30-pack-year history of smoking and currently smoke or have quit within the past 15 years. Fecal occult blood test (FOBT) of the stool. You may have this test every year starting at age 70. Flexible sigmoidoscopy or colonoscopy. You may have a sigmoidoscopy every 5 years or a colonoscopy every 10 years starting at age 24. Hepatitis C blood test. Hepatitis B blood test. Sexually transmitted disease (STD) testing. Diabetes screening. This is done by checking your blood sugar (glucose) after you have not eaten for a while (fasting). You may have this done every 1-3 years. Bone density scan. This is done to screen for osteoporosis. You may have this done starting at age 74. Mammogram. This may be done every 1-2 years. Talk to your health care provider about how often you should have regular mammograms. Talk with your health care provider about your test results, treatment options, and if necessary, the need for more tests. Vaccines  Your health care provider may recommend certain vaccines, such as: Influenza vaccine. This is recommended every year. Tetanus, diphtheria, and acellular pertussis (Tdap, Td) vaccine. You may need a Td booster every 10 years. Zoster vaccine. You may need this after age 31. Pneumococcal 13-valent conjugate (PCV13) vaccine. One dose is recommended after age 39. Pneumococcal polysaccharide (PPSV23) vaccine. One dose is recommended after age 13.  Talk to your  health care provider about which screenings and vaccines you need and how often you need them. This information is not intended to replace advice given to you by your health care provider. Make sure you discuss any questions you have with your health care provider. Document Released: 07/27/2015 Document Revised: 03/19/2016 Document Reviewed: 05/01/2015 Elsevier Interactive Patient Education  2017 [employer] Prevention in the Home Falls can cause injuries. They can happen to people of all ages. There are many things you can do to make your home safe and to help prevent falls. What can I do on the outside of my home? Regularly fix the edges of walkways and driveways and fix any cracks. Remove anything that might make you trip as you walk through a door, such as a raised step or threshold. Trim any bushes or trees on the path to your home. Use bright outdoor lighting. Clear any walking paths of anything that might make someone trip, such as rocks or tools. Regularly check to see if handrails are loose or broken. Make sure that both sides of any steps have handrails. Any raised decks and porches should have guardrails on the edges. Have any leaves, snow, or ice cleared regularly. Use sand or salt on walking paths during winter. Clean up any spills in your garage right away. This includes oil or grease spills. What can I do in the bathroom? Use night lights. Install grab bars by the toilet and in the tub and shower. Do not use towel bars as grab bars. Use non-skid mats or decals in the tub or shower. If you need to sit down in the shower, use a plastic, non-slip stool. Keep the floor dry. Clean up any water that spills on the floor as soon as it happens. Remove soap buildup in the tub or shower regularly. Attach bath mats securely with double-sided non-slip rug tape. Do not have throw rugs and other things on the floor that can make you trip. What can I do in the bedroom? Use night  lights. Make sure that you have a light by your bed that is easy to reach. Do not use any sheets or blankets that are too big for your bed. They should not hang down onto the floor. Have a firm chair that has side arms. You can use this for support while you get dressed. Do not have throw rugs and other things on the floor that can make you trip. What can I do in the kitchen? Clean up any spills right away. Avoid walking on wet floors. Keep items that you use a lot in easy-to-reach places. If you need to reach something above you, use a strong step stool that has a grab bar. Keep electrical cords out of the way. Do not use floor polish or wax that makes floors slippery. If you must use wax, use non-skid floor wax. Do not have throw rugs and other things on the floor that can make you trip. What can I do with my stairs? Do not leave any items on the stairs. Make sure that there are handrails on both sides of the stairs and use them. Fix handrails that are broken or loose. Make sure that handrails are as long as the stairways. Check any carpeting to make sure that it is firmly attached to the stairs. Fix any carpet that is loose or worn. Avoid having throw rugs at the top or bottom of the stairs. If you do have throw  rugs, attach them to the floor with carpet tape. Make sure that you have a light switch at the top of the stairs and the bottom of the stairs. If you do not have them, ask someone to add them for you. What else can I do to help prevent falls? Wear shoes that: Do not have high heels. Have rubber bottoms. Are comfortable and fit you well. Are closed at the toe. Do not wear sandals. If you use a stepladder: Make sure that it is fully opened. Do not climb a closed stepladder. Make sure that both sides of the stepladder are locked into place. Ask someone to hold it for you, if possible. Clearly mark and make sure that you can see: Any grab bars or handrails. First and last  steps. Where the edge of each step is. Use tools that help you move around (mobility aids) if they are needed. These include: Canes. Walkers. Scooters. Crutches. Turn on the lights when you go into a dark area. Replace any light bulbs as soon as they burn out. Set up your furniture so you have a clear path. Avoid moving your furniture around. If any of your floors are uneven, fix them. If there are any pets around you, be aware of where they are. Review your medicines with your doctor. Some medicines can make you feel dizzy. This can increase your chance of falling. Ask your doctor what other things that you can do to help prevent falls. This information is not intended to replace advice given to you by your health care provider. Make sure you discuss any questions you have with your health care provider. Document Released: 04/26/2009 Document Revised: 12/06/2015 Document Reviewed: 08/04/2014 Elsevier Interactive Patient Education  2017 [employer].

## 2022-01-24 IMAGING — CR DG CHEST 2V
1 series · 2 of 2 positions shown · non-contrast
Comparison: 06/08/2013

CLINICAL DATA: Cough over the last week

EXAM:
CHEST - 2 VIEW

[Series 1: dg chest 2 view · 0.14mm/px · 2 of 2 slices shown]
[im 1/2]
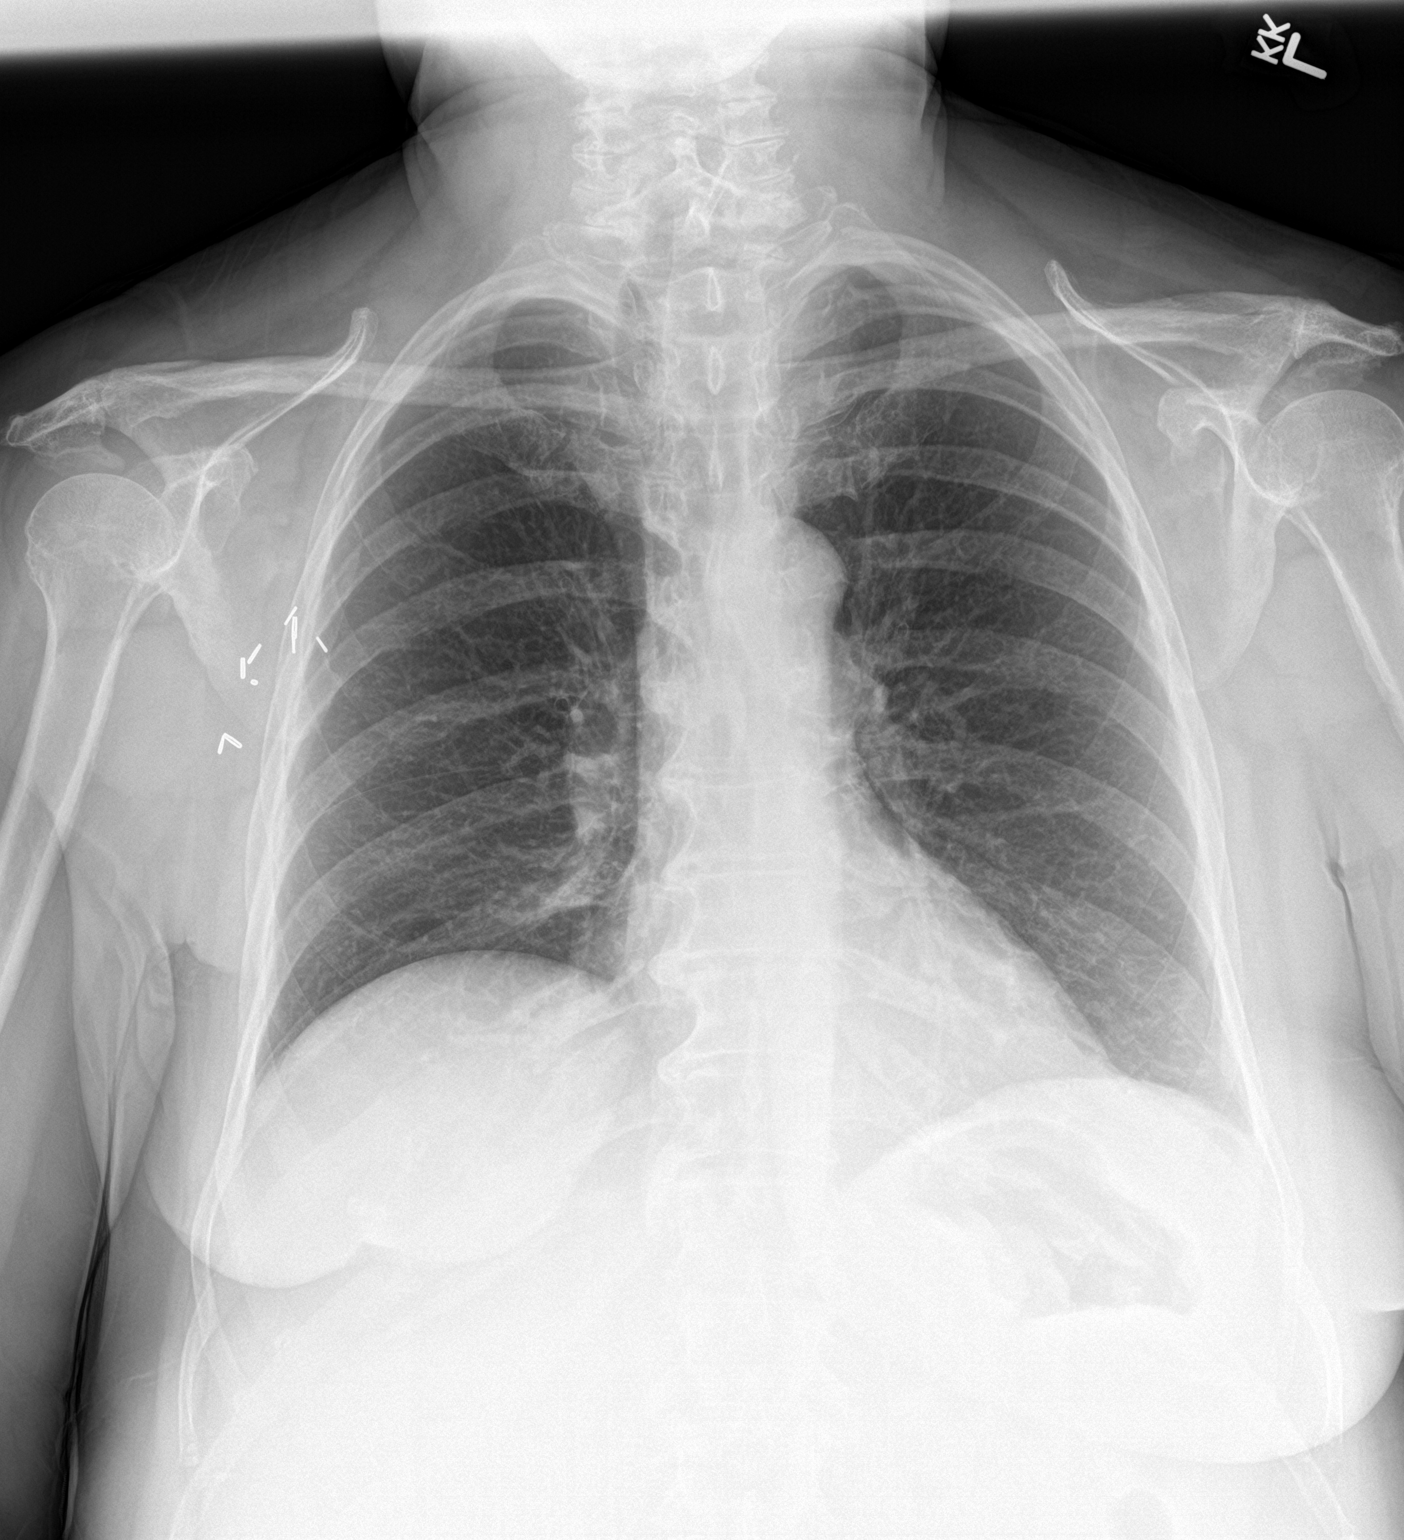
[im 2/2]
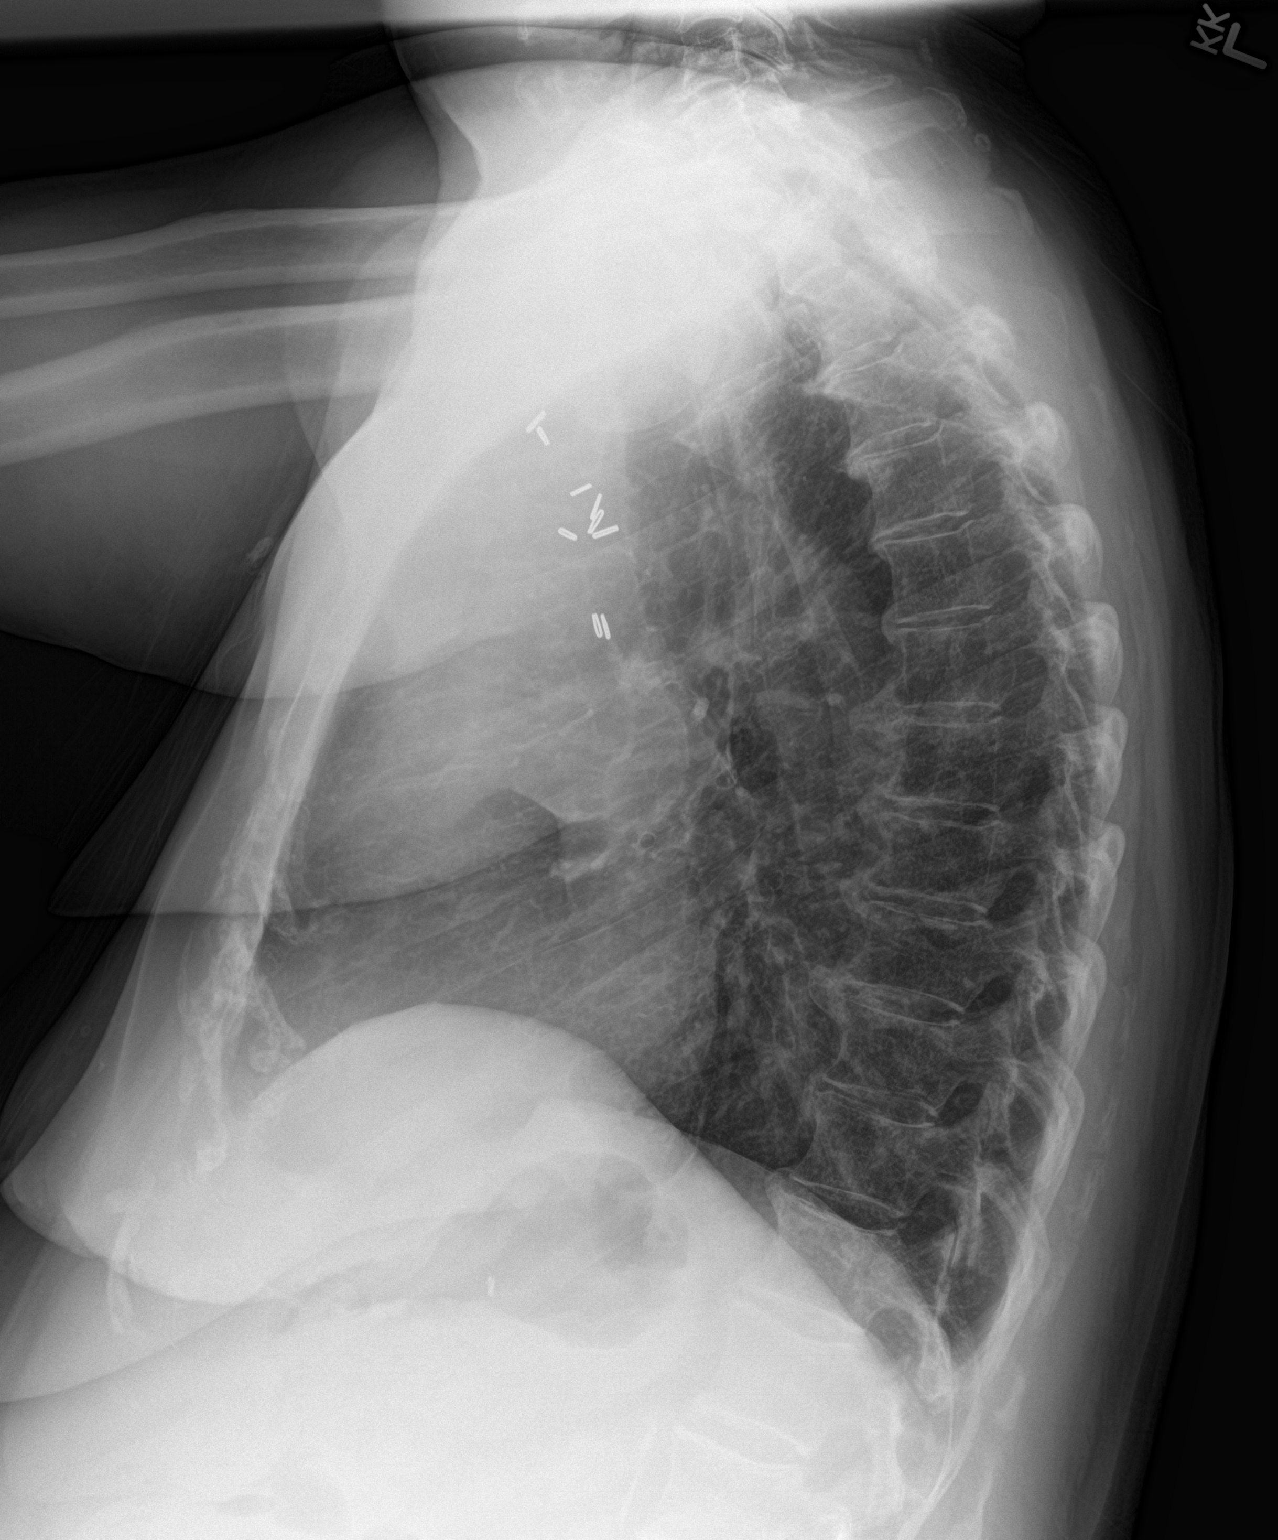

[2 of 2 positions shown; findings below may reference images not displayed]

FINDINGS: Heart size is normal. Mediastinal shadows unremarkable. The lungs
are clear. No infiltrate, collapse or effusion. Surgical clips in
the right axilla. No acute bone finding.
IMPRESSION: No active cardiopulmonary disease.

## 2022-02-02 ENCOUNTER — Other Ambulatory Visit: Payer: Self-pay | Admitting: Family Medicine

## 2022-02-03 ENCOUNTER — Inpatient Hospital Stay: Payer: Medicare PPO | Attending: Oncology

## 2022-02-03 DIAGNOSIS — D508 Other iron deficiency anemias: Secondary | ICD-10-CM

## 2022-02-03 DIAGNOSIS — E538 Deficiency of other specified B group vitamins: Secondary | ICD-10-CM | POA: Diagnosis not present

## 2022-02-03 MED ORDER — CYANOCOBALAMIN 1000 MCG/ML IJ SOLN
1000.0000 ug | Freq: Once | INTRAMUSCULAR | Status: AC
  Administered 2022-02-03: 1000 ug via INTRAMUSCULAR
  Filled 2022-02-03: qty 1

## 2022-02-05 DIAGNOSIS — E119 Type 2 diabetes mellitus without complications: Secondary | ICD-10-CM | POA: Diagnosis not present

## 2022-02-05 DIAGNOSIS — K219 Gastro-esophageal reflux disease without esophagitis: Secondary | ICD-10-CM | POA: Diagnosis not present

## 2022-02-05 DIAGNOSIS — I10 Essential (primary) hypertension: Secondary | ICD-10-CM | POA: Diagnosis not present

## 2022-02-05 DIAGNOSIS — E538 Deficiency of other specified B group vitamins: Secondary | ICD-10-CM | POA: Diagnosis not present

## 2022-02-05 DIAGNOSIS — Z6834 Body mass index (BMI) 34.0-34.9, adult: Secondary | ICD-10-CM | POA: Diagnosis not present

## 2022-02-05 DIAGNOSIS — E669 Obesity, unspecified: Secondary | ICD-10-CM | POA: Diagnosis not present

## 2022-02-05 DIAGNOSIS — E785 Hyperlipidemia, unspecified: Secondary | ICD-10-CM | POA: Diagnosis not present

## 2022-02-05 DIAGNOSIS — G4700 Insomnia, unspecified: Secondary | ICD-10-CM | POA: Diagnosis not present

## 2022-02-05 DIAGNOSIS — M1990 Unspecified osteoarthritis, unspecified site: Secondary | ICD-10-CM | POA: Diagnosis not present

## 2022-02-11 ENCOUNTER — Ambulatory Visit (INDEPENDENT_AMBULATORY_CARE_PROVIDER_SITE_OTHER): Payer: Medicare PPO

## 2022-02-11 DIAGNOSIS — R55 Syncope and collapse: Secondary | ICD-10-CM | POA: Diagnosis not present

## 2022-02-11 LAB — ECHOCARDIOGRAM COMPLETE
AR max vel: 1.97 cm2
AV Area VTI: 2.33 cm2
AV Area mean vel: 2.02 cm2
AV Mean grad: 3 mmHg
AV Peak grad: 6.3 mmHg
Ao pk vel: 1.25 m/s
Area-P 1/2: 3.05 cm2
Calc EF: 71.5 %
S' Lateral: 2.6 cm
Single Plane A2C EF: 75.1 %
Single Plane A4C EF: 67.3 %

## 2022-02-20 ENCOUNTER — Telehealth: Payer: Self-pay | Admitting: Emergency Medicine

## 2022-02-20 NOTE — Telephone Encounter (Signed)
-----   Message from Kate Sable, MD sent at 02/14/2022  3:17 PM EDT ----- Echo with normal systolic function, no gross structural abnormalities to suggest etiology of syncope.

## 2022-02-20 NOTE — Telephone Encounter (Signed)
Called and spoke with patient. Results reviewed with patient, pt verbalized understanding,  questions (if any) answered.   ?

## 2022-02-22 ENCOUNTER — Other Ambulatory Visit: Payer: Self-pay | Admitting: Family Medicine

## 2022-02-26 ENCOUNTER — Telehealth: Payer: Self-pay | Admitting: Family Medicine

## 2022-02-26 NOTE — Telephone Encounter (Signed)
Aware, thanks  I will see her then

## 2022-02-26 NOTE — Telephone Encounter (Signed)
Patient called and stated that she is having leg and arm pain in her muscles. Patient has been scheduled for an appointment on 02/28/2022 at 9:30. Patient has been sent to access nurse.

## 2022-02-26 NOTE — Telephone Encounter (Signed)
Called patient to make sure she didn't want to be seen sooner. Patient has a follow up with Cardiology and does not want to be seen before scheduled time on Friday. Will give office a call if anything comes up or changes.    [facility] - Client TELEPHONE ADVICE RECORD [facility] Patient Name: Martha Hamilton Gender: Female DOB: 01-01-1951 Age: 71 Y 1 M 2 D Return Phone Number: [phone] (Primary) Address[place]  [address] Client [facility] - Client Client Site [place] Provider [place], Roque Lias - MD Contact Type Call Who Is Calling Patient / Member / Family / Caregiver Call Type Triage / Clinical Relationship To Patient Self Return Phone Number [phone] (Primary) Chief Complaint Muscle pain Reason for Call Symptomatic / Request for [place] states she is experiencing muscle pain all over. Translation No Nurse Assessment Nurse: Raphael Gibney, RN, Vanita Ingles Date/Time (Eastern Time): 02/26/2022 9:04:13 AM Confirm and document reason for call. If symptomatic, describe symptoms. ---Caller states she has muscle pain all over. has pain in her left hip, legs and arms. Has been using salopin patches and procare. has been using ibuprofen and tylenol arthritis. pain level 4-9. Pain gets worse as the day goes on. Does the patient have any new or worsening symptoms? ---Yes Will a triage be completed? ---Yes Related visit to physician within the last 2 weeks? ---No Does the PT have any chronic conditions? (i.e. diabetes, asthma, this includes High risk factors for pregnancy, etc.) ---Yes List chronic conditions. ---osteoarthritis; HTN; diabetes Is this a behavioral health or substance abuse call? ---No Guidelines Guideline Title Affirmed Question Affirmed Notes Nurse Date/Time (Eastern Time) Hip Pain [1] MODERATE pain (e.g., interferes with  normal activities, limping) AND [2] present > 3 days Raphael Gibney, RN, Vanita Ingles 02/26/2022 9:07:36 AM PLEASE NOTE: All timestamps contained within this report are represented as [place] Standard Time. CONFIDENTIALTY NOTICE: This fax transmission is intended only for the addressee. It contains information that is legally privileged, confidential or otherwise protected from use or disclosure. If you are not the intended recipient, you are strictly prohibited from reviewing, disclosing, copying using or disseminating any of this information or taking any action in reliance on or regarding this information. If you have received this fax in error, please notify us immediately by telephone so that we can arrange for its return to [place]. Phone: [phone], Toll-Free: [phone], Fax: [phone] Page: 2 of 2 Call Id: [id number] [place]. Time Eilene Ghazi Time) Disposition Final User 02/26/2022 9:12:43 AM SEE PCP WITHIN 3 DAYS Yes Raphael Gibney, RN, Vanita Ingles Final Disposition 02/26/2022 9:12:43 AM SEE PCP WITHIN 3 DAYS Yes Raphael Gibney, RN, Doreatha Lew Disagree/Comply Comply Caller Understands Yes PreDisposition Call Doctor Care Advice Given Per Guideline SEE PCP WITHIN 3 DAYS: * You need to be seen within 2 or 3 days. LOCAL HEAT: * Apply a warm wet washcloth or heating pad for 10 minutes three times daily. * This will help to increase circulation and improve healing. CALL BACK IF: * Fever or severe pain occurs * Skin redness or a rash appears * You become worse CARE ADVICE given per Hip Pain (Adult) guideline. Comments User: Dannielle Burn, RN Date/Time Eilene Ghazi Time): 02/26/2022 9:12:17 AM pt states she has an appt on Friday

## 2022-02-27 ENCOUNTER — Encounter: Payer: Self-pay | Admitting: Cardiology

## 2022-02-27 ENCOUNTER — Ambulatory Visit: Payer: Medicare PPO | Admitting: Cardiology

## 2022-02-27 VITALS — BP 144/80 | HR 105 | Ht 59.5 in | Wt 168.4 lb

## 2022-02-27 DIAGNOSIS — I10 Essential (primary) hypertension: Secondary | ICD-10-CM | POA: Diagnosis not present

## 2022-02-27 DIAGNOSIS — E781 Pure hyperglyceridemia: Secondary | ICD-10-CM

## 2022-02-27 DIAGNOSIS — R55 Syncope and collapse: Secondary | ICD-10-CM | POA: Diagnosis not present

## 2022-02-27 MED ORDER — EZETIMIBE 10 MG PO TABS: 10.0000 mg | ORAL_TABLET | Freq: Every day | ORAL | 3 refills | Status: DC

## 2022-02-27 NOTE — Progress Notes (Signed)
Cardiology Office Note:    Date:  02/27/2022   ID:  Martha Hamilton, DOB 1951-05-27, MRN [id number]  PCP:  Abner Greenspan, MD   [facility] Cardiologist:  Kate Sable, MD     Referring MD: Abner Greenspan, MD   Chief Complaint  Patient presents with   6-8 week follow up     Patient c/o muscle/joint pain. Medications reviewed by the patient verbally.     History of Present Illness:    Martha Hamilton is a 71 y.o. female with a hx of hypertension, hyperlipidemia, diabetes, CKD 3 presenting for follow-up.  Previously seen due to syncope.  Cardiac monitor, echocardiogram obtained to evaluate cardiac function.  She has history of osteoarthritis requiring occasional injections to her knee.  Complains of numbness in her fingertips, diagnosed with carpal tunnel syndrome years ago.  Has a history of elevated heart rate on Toprol-XL.  No further episodes of passing out since last visit.  Echocardiogram 02/11/2022 EF 60 to 65% Cardiac monitor 02/05/2022 normal, no arrhythmias to suggest etiology of syncope.   Past Medical History:  Diagnosis Date   Allergic rhinitis    Breast cancer ([place]) 1996   right breast   DM2 (diabetes mellitus, type 2) (HCC)    GERD (gastroesophageal reflux disease)    Hx of cardiovascular stress test    a. Lex MV 2/14:  EF 69%, no ischemia   Hyperlipidemia    Hypertension    IDA (iron deficiency anemia) 08/27/2020   Iron deficiency anemia    Obesity    Osteoarthritis    hands   Ulcer     Past Surgical History:  Procedure Laterality Date   APPENDECTOMY  1976   BREAST LUMPECTOMY Right 1996   right breast, lumpectomy w/nodes   CATARACT EXTRACTION W/PHACO Left 11/28/2020   Procedure: CATARACT EXTRACTION PHACO AND INTRAOCULAR LENS PLACEMENT ([place]) LEFT DIABETIC;  Surgeon: Leandrew Koyanagi, MD;  Location: [facility];  Service: Ophthalmology;  Laterality: Left;  5.02 01:07.4 7.4%   CATARACT EXTRACTION W/PHACO Right  12/12/2020   Procedure: CATARACT EXTRACTION PHACO AND INTRAOCULAR LENS PLACEMENT ([facility]) RIGHT DIABETIC 5.22 00:59.2;  Surgeon: Leandrew Koyanagi, MD;  Location: [facility];  Service: Ophthalmology;  Laterality: Right;   CHONDROPLASTY Left 02/10/2018   Procedure: CHONDROPLASTY;  Surgeon: Lovell Sheehan, MD;  Location: [facility];  Service: Orthopedics;  Laterality: Left;   GASTRIC RESTRICTION SURGERY     for reflux   HYSTEROSCOPY WITH D & C N/A 10/18/2012   Procedure: DILATATION AND CURETTAGE /HYSTEROSCOPY;  Surgeon: Mora Bellman, MD;  Location: [facility];  Service: Gynecology;  Laterality: N/A;   KNEE ARTHROSCOPY WITH MEDIAL MENISECTOMY Left 02/10/2018   Procedure: KNEE ARTHROSCOPY WITH MEDIAL and LATERAL MENISECTOMY;  Surgeon: Lovell Sheehan, MD;  Location: [facility];  Service: Orthopedics;  Laterality: Left;   POLYPECTOMY N/A 10/18/2012   Procedure: POLYPECTOMY;  Surgeon: Mora Bellman, MD;  Location: [facility];  Service: Gynecology;  Laterality: N/A;   SYNOVECTOMY Left 02/10/2018   Procedure: SYNOVECTOMY;  Surgeon: Lovell Sheehan, MD;  Location: [facility];  Service: Orthopedics;  Laterality: Left;    Current Medications: Current Meds  Medication Sig   ACCU-CHEK AVIVA PLUS test strip TEST BLOOD SUGAR EVERY DAY FOR DIABETES   Accu-Chek Softclix Lancets lancets TEST BLOOD SUGAR EVERY DAY FOR DIABETES   acetaminophen (TYLENOL) 500 MG tablet Take 1,000 mg by mouth every 6 (six) hours as needed for mild pain.    Alcohol Swabs (  DROPSAFE ALCOHOL PREP) 70 % PADS USE ONE TIME DAILY AS DIRECTED  WHEN  CHECKING BLOOD SUGAR   amitriptyline (ELAVIL) 25 MG tablet Take 1 tablet (25 mg total) by mouth at bedtime.   aspirin 81 MG tablet Take 81 mg by mouth at bedtime.    Blood Glucose Calibration (ACCU-CHEK AVIVA) SOLN USE TO CALIBRATE METER AS DIRECTED   Blood Glucose Monitoring Suppl (ACCU-CHEK AVIVA PLUS) w/Device KIT Use to check blood sugar once daily for DM (dx. E11.9)   calcium elemental as  carbonate (BARIATRIC TUMS ULTRA) 400 MG chewable tablet Chew 1,000 mg by mouth daily as needed for heartburn.   Camphor-Menthol-Methyl Sal (SALONPAS) 3.07-19-08 % PTCH Apply topically.   celecoxib (CELEBREX) 200 MG capsule    Cholecalciferol (VITAMIN D) 2000 units tablet Take 2,000 Units by mouth daily.   Cyanocobalamin (VITAMIN B-12) 1000 MCG SUBL Place 1 tablet (1,000 mcg total) under the tongue daily.   Docusate Sodium (DSS) 100 MG CAPS Take 1 capsule by mouth as needed.   ezetimibe (ZETIA) 10 MG tablet Take 1 tablet (10 mg total) by mouth daily.   fenofibrate (TRICOR) 145 MG tablet TAKE 1 TABLET EVERY DAY   glipiZIDE (GLUCOTROL XL) 5 MG 24 hr tablet Take 1 tablet (5 mg total) by mouth daily with breakfast.   loratadine (CLARITIN) 10 MG tablet Take 10 mg by mouth daily as needed for allergies.   losartan (COZAAR) 50 MG tablet Take 1 tablet (50 mg total) by mouth daily.   meclizine (ANTIVERT) 25 MG tablet Take 1 tablet (25 mg total) by mouth 3 (three) times daily as needed for dizziness.   metFORMIN (GLUCOPHAGE) 1000 MG tablet Take 0.5 tablets (500 mg total) by mouth 2 (two) times daily with a meal.   metoprolol succinate (TOPROL-XL) 50 MG 24 hr tablet Take 1 tablet (50 mg total) by mouth daily. Take with or immediately following a meal.   omeprazole (PRILOSEC) 40 MG capsule TAKE 1 CAPSULE IN THE MORNING AND AT BEDTIME.   ondansetron (ZOFRAN) 4 MG tablet TAKE 1 TABLET EVERY 4 HOURS AS NEEDED FOR NAUSEA AND VOMITING   SALINE NASAL SPRAY NA Place 1 spray into the nose daily as needed (congestion).      Allergies:   Cholestyramine, Simvastatin, Lipitor [atorvastatin], and Niacin   Social History   Socioeconomic History   Marital status: Single    Spouse name: Not on file   Number of children: 0   Years of education: Not on file   Highest education level: Not on file  Occupational History   Occupation: retired    Fish farm manager: RETIRED  Tobacco Use   Smoking status: Never   Smokeless  tobacco: Never  Vaping Use   Vaping Use: Never used  Substance and Sexual Activity   Alcohol use: Never    Alcohol/week: 0.0 standard drinks of alcohol   Drug use: No   Sexual activity: Not Currently    Birth control/protection: Post-menopausal  Other Topics Concern   Not on file  Social History Narrative   Lives with, and cares for her elderly mother.   Social Determinants of Health   Financial Resource Strain: Low Risk  (01/20/2022)   Overall Financial Resource Strain (CARDIA)    Difficulty of Paying Living Expenses: Not hard at all  Food Insecurity: No Food Insecurity (01/20/2022)   Hunger Vital Sign    Worried About Running Out of Food in the Last Year: Never true    Ran Out of Food in the Last  Year: Never true  Transportation Needs: No Transportation Needs (01/20/2022)   PRAPARE - Hydrologist (Medical): No    Lack of Transportation (Non-Medical): No  Physical Activity: Insufficiently Active (01/20/2022)   Exercise Vital Sign    Days of Exercise per Week: 2 days    Minutes of Exercise per Session: 20 min  Stress: No Stress Concern Present (01/20/2022)   [place]    Feeling of Stress : Not at all  Social Connections: Moderately Isolated (01/20/2022)   Social Connection and Isolation Panel [NHANES]    Frequency of Communication with Friends and Family: More than three times a week    Frequency of Social Gatherings with Friends and Family: More than three times a week    Attends Religious Services: More than 4 times per year    Active Member of [employer] or Organizations: No    Attends Music therapist: Never    Marital Status: Never married     Family History: The patient's family history includes CAD (age of onset: 21) in her father; Diabetes in her mother; Esophageal cancer in her brother; Heart disease in her mother and sister; Hypertension in her mother; Kidney cancer  in her mother; Stroke in her father. There is no history of Colon cancer, Rectal cancer, Stomach cancer, or Pancreatic cancer.  ROS:   Please see the history of present illness.     All other systems reviewed and are negative.  EKGs/Labs/Other Studies Reviewed:    The following studies were reviewed today:   EKG:  EKG is  ordered today.  The ekg ordered today demonstrates sinus tachycardia, heart rate 108  Recent Labs: 09/09/2021: TSH 1.17 12/01/2021: ALT 17; Hemoglobin 12.7; Platelets 435 12/11/2021: BUN 23; Creatinine, Ser 1.01; Potassium 4.2; Sodium 139  Recent Lipid Panel    Component Value Date/Time   CHOL 176 12/11/2021 0754   TRIG 396.0 (H) 12/11/2021 0754   HDL 38.80 (L) 12/11/2021 0754   CHOLHDL 5 12/11/2021 0754   VLDL 79.2 (H) 12/11/2021 0754   LDLCALC 51 10/21/2011 0846   LDLDIRECT 89.0 12/11/2021 0754     Risk Assessment/Calculations:         Physical Exam:    VS:  BP (!) 144/80 (BP Location: Left Arm, Patient Position: Sitting, Cuff Size: Normal)   Pulse (!) 105   Ht 4' 11.5" (1.511 m)   Wt 168 lb 6 oz (76.4 kg)   LMP 06/13/2001   SpO2 98%   BMI 33.44 kg/m     Wt Readings from Last 3 Encounters:  02/27/22 168 lb 6 oz (76.4 kg)  01/20/22 167 lb (75.8 kg)  01/10/22 167 lb 9.6 oz (76 kg)     GEN:  Well nourished, well developed in no acute distress HEENT: Normal NECK: No JVD; No carotid bruits CARDIAC: RRR, no murmurs, rubs, gallops RESPIRATORY:  Clear to auscultation without rales, wheezing or rhonchi  ABDOMEN: Soft, non-tender, non-distended MUSCULOSKELETAL:  No edema; No deformity  SKIN: Warm and dry NEUROLOGIC:  Alert and oriented x 3 PSYCHIATRIC:  Normal affect   ASSESSMENT:    1. Syncope and collapse   2. Primary hypertension   3. High triglycerides     PLAN:    In order of problems listed above:  Syncope, symptoms appear vasovagal with prodromal symptoms of dizziness and blurred vision.  Echocardiogram showed normal EF 60 to 65%,  no wall motion abnormalities, cardiac monitor with no  arrhythmias to suggest syncope.  Patient made aware of results, reassured. Hypertension, BP elevated, tachycardic.  Patient is in chronic pain due to bilateral knee arthritis, possible carpal tunnel.  This is driving blood pressure and heart rates.  Encouraged to keep appointment with specialist and or PCP to address underlying pain and discomfort.  Continue losartan 50, increase Toprol-XL to 50 twice daily. Elevated triglycerides, continue fenofibrate.  Has statin allergies, start Zetia 10 mg daily.  Repeat lipid panel in 3 months.  Follow-up in 6 months.       Medication Adjustments/Labs and Tests Ordered: Current medicines are reviewed at length with the patient today.  Concerns regarding medicines are outlined above.  Orders Placed This Encounter  Procedures   Lipid panel   EKG 12-Lead   Meds ordered this encounter  Medications   ezetimibe (ZETIA) 10 MG tablet    Sig: Take 1 tablet (10 mg total) by mouth daily.    Dispense:  30 tablet    Refill:  3    Patient Instructions  Medication Instructions:   Your physician has recommended you make the following change in your medication:    START taking Ezetimibe (Zetia) 10 MG once a day.  *If you need a refill on your cardiac medications before your next appointment, please call your pharmacy*   Lab Work:  Your physician recommends that you return for a FASTING lipid profile: IN 3 months  - You will need to be fasting. Please do not have anything to eat or drink after midnight the morning you have the lab work. You may only have water or black coffee with no cream or sugar.   - Please go to the [facility]. You will check in at the front desk to the right as you walk into the atrium. Valet Parking is offered if needed. - No appointment needed. You may go any day between 7 am and 6 pm.     Follow-Up: At [facility], you and your health needs are our priority.  As  part of our continuing mission to provide you with exceptional heart care, we have created designated Provider Care Teams.  These Care Teams include your primary Cardiologist (physician) and Advanced Practice Providers (APPs -  Physician Assistants and Nurse Practitioners) who all work together to provide you with the care you need, when you need it.  We recommend signing up for the patient portal called "MyChart".  Sign up information is provided on this After Visit Summary.  MyChart is used to connect with patients for Virtual Visits (Telemedicine).  Patients are able to view lab/test results, encounter notes, upcoming appointments, etc.  Non-urgent messages can be sent to your provider as well.   To learn more about what you can do with MyChart, go to [email or web address].    Your next appointment:   6 month(s)  The format for your next appointment:   In Person  Provider:   You may see Kate Sable, MD or one of the following Advanced Practice Providers on your designated Care Team:   Murray Hodgkins, NP Christell Faith, PA-C Cadence Kathlen Mody, [place]    Other Instructions   Important Information About Sugar          Signed, Kate Sable, MD  02/27/2022 12:25 PM    [place]

## 2022-02-27 NOTE — Patient Instructions (Signed)
Medication Instructions:   Your physician has recommended you make the following change in your medication:    START taking Ezetimibe (Zetia) 10 MG once a day.  *If you need a refill on your cardiac medications before your next appointment, please call your pharmacy*   Lab Work:  Your physician recommends that you return for a FASTING lipid profile: IN 3 months  - You will need to be fasting. Please do not have anything to eat or drink after midnight the morning you have the lab work. You may only have water or black coffee with no cream or sugar.   - Please go to the [facility] You will check in at the front desk to the right as you walk into the atrium. Valet Parking is offered if needed. - No appointment needed. You may go any day between 7 am and 6 pm.     Follow-Up: At [facility], you and your health needs are our priority.  As part of our continuing mission to provide you with exceptional heart care, we have created designated Provider Care Teams.  These Care Teams include your primary Cardiologist (physician) and Advanced Practice Providers (APPs -  Physician Assistants and Nurse Practitioners) who all work together to provide you with the care you need, when you need it.  We recommend signing up for the patient portal called "MyChart".  Sign up information is provided on this After Visit Summary.  MyChart is used to connect with patients for Virtual Visits (Telemedicine).  Patients are able to view lab/test results, encounter notes, upcoming appointments, etc.  Non-urgent messages can be sent to your provider as well.   To learn more about what you can do with MyChart, go to [email or web address].    Your next appointment:   6 month(s)  The format for your next appointment:   In Person  Provider:   You may see Kate Sable, MD or one of the following Advanced Practice Providers on your designated Care Team:   Murray Hodgkins, NP Christell Faith, PA-C Cadence  Kathlen Mody, [place]    Other Instructions   Important Information About Sugar

## 2022-02-28 ENCOUNTER — Ambulatory Visit (INDEPENDENT_AMBULATORY_CARE_PROVIDER_SITE_OTHER)
Admission: RE | Admit: 2022-02-28 | Discharge: 2022-02-28 | Disposition: A | Discharge status: Home / Self Care | Payer: Medicare PPO | Source: Ambulatory Visit | Attending: Family Medicine | Admitting: Family Medicine

## 2022-02-28 ENCOUNTER — Ambulatory Visit: Payer: Medicare PPO | Admitting: Family Medicine

## 2022-02-28 ENCOUNTER — Encounter: Payer: Self-pay | Admitting: Family Medicine

## 2022-02-28 VITALS — BP 138/74 | HR 96 | Temp 98.1°F | Resp 16 | Ht 59.5 in | Wt 169.4 lb

## 2022-02-28 DIAGNOSIS — M542 Cervicalgia: Secondary | ICD-10-CM

## 2022-02-28 DIAGNOSIS — R252 Cramp and spasm: Secondary | ICD-10-CM | POA: Insufficient documentation

## 2022-02-28 DIAGNOSIS — M791 Myalgia, unspecified site: Secondary | ICD-10-CM

## 2022-02-28 LAB — COMPREHENSIVE METABOLIC PANEL
ALT: 15 U/L (ref 0–35)
AST: 17 U/L (ref 0–37)
Albumin: 4.1 g/dL (ref 3.5–5.2)
Alkaline Phosphatase: 40 U/L (ref 39–117)
BUN: 20 mg/dL (ref 6–23)
CO2: 26 mEq/L (ref 19–32)
Calcium: 9.6 mg/dL (ref 8.4–10.5)
Chloride: 102 mEq/L (ref 96–112)
Creatinine, Ser: 1.1 mg/dL (ref 0.40–1.20)
GFR: 50.7 mL/min — ABNORMAL LOW (ref >60.00)
Glucose, Bld: 147 mg/dL — ABNORMAL HIGH (ref 70–99)
Potassium: 4.3 mEq/L (ref 3.5–5.1)
Sodium: 137 mEq/L (ref 135–145)
Total Bilirubin: 0.2 mg/dL (ref 0.2–1.2)
Total Protein: 7.5 g/dL (ref 6.0–8.3)

## 2022-02-28 LAB — CBC WITH DIFFERENTIAL/PLATELET
Basophils Absolute: 0.1 10*3/uL (ref 0.0–0.1)
Basophils Relative: 1.3 % (ref 0.0–3.0)
Eosinophils Absolute: 0.3 10*3/uL (ref 0.0–0.7)
Eosinophils Relative: 4.6 % (ref 0.0–5.0)
HCT: 36.6 % (ref 36.0–46.0)
Hemoglobin: 11.7 g/dL — ABNORMAL LOW (ref 12.0–15.0)
Lymphocytes Relative: 18.9 % (ref 12.0–46.0)
Lymphs Abs: 1.4 10*3/uL (ref 0.7–4.0)
MCHC: 32 g/dL (ref 30.0–36.0)
MCV: 87.2 fl (ref 78.0–100.0)
Monocytes Absolute: 0.4 10*3/uL (ref 0.1–1.0)
Monocytes Relative: 6.3 % (ref 3.0–12.0)
Neutro Abs: 4.9 10*3/uL (ref 1.4–7.7)
Neutrophils Relative %: 68.9 % (ref 43.0–77.0)
Platelets: 412 10*3/uL — ABNORMAL HIGH (ref 150.0–400.0)
RBC: 4.2 Mil/uL (ref 3.87–5.11)
RDW: 14 % (ref 11.5–15.5)
WBC: 7.2 10*3/uL (ref 4.0–10.5)

## 2022-02-28 LAB — SEDIMENTATION RATE: Sed Rate: 23 mm/hr (ref 0–30)

## 2022-02-28 LAB — MAGNESIUM: Magnesium: 1.7 mg/dL (ref 1.5–2.5)

## 2022-02-28 NOTE — Assessment & Plan Note (Addendum)
achey pain in legs and arms and shoulder girdle  Some tenderness in calf and forearm bilat  No statin (takes tricor- less likely to cause myalgia)  Labs ordered incl esr to r/o pmr and magnesium  Pend result  If neg would enc stretching  Consider muscle relaxer

## 2022-02-28 NOTE — Patient Instructions (Signed)
Keep doing what you are doing Stay hydrated   Labs and xray today  We will update you with result and plan   You can try magnesium over the counter 250 mg at bedtime to prevent cramps  If it gives you diarrhea stop it and let us know

## 2022-02-28 NOTE — Progress Notes (Signed)
Subjective:    Patient ID: Martha Hamilton, female    DOB: 06/12/1951, 71 y.o.   MRN: [id number]  HPI Pt presents for pain in muscles   Wt Readings from Last 3 Encounters:  02/28/22 169 lb 6.4 oz (76.8 kg)  02/27/22 168 lb 6 oz (76.4 kg)  01/20/22 167 lb (75.8 kg)   33.64 kg/m  Left leg and right arm are sore  Now both arms and legs - both below and above elbow  Right side of her neck bothers her on and off   Was initially at bedtime and now all day long   Crampy and achy  Started 5 weeks ago  Worse as day goes on  Worse if she stands for a long time   No fever No rashes  Nothing else going on   Joints bother her but that is different/ arthritis   Also gets some leg cramps (primarily in R leg) that wake her up in the night She has to get up and walk it off   Lab Results  Component Value Date   CREATININE 1.01 12/11/2021   BUN 23 12/11/2021   NA 139 12/11/2021   K 4.2 12/11/2021   CL 103 12/11/2021   CO2 24 12/11/2021   Lab Results  Component Value Date   WBC 8.3 12/01/2021   HGB 12.7 12/01/2021   HCT 38.6 12/01/2021   MCV 88.1 12/01/2021   PLT 435 (H) 12/01/2021   Lab Results  Component Value Date   ESRSEDRATE 6 02/08/2016   Had a check from heart doctor   Given zetia but has not started it yet   Takes celebrex every day   Patient Active Problem List   Diagnosis Date Noted   Muscle cramps 02/28/2022   Neck pain 02/28/2022   Syncope 12/18/2021   Strain of flexor tendon of wrist 12/02/2021   Tendinitis of left wrist 12/02/2021   Left wrist pain 11/13/2021   Family history of cancer 08/27/2020   History of breast cancer 08/27/2020   IDA (iron deficiency anemia) 08/27/2020   B12 deficiency 08/27/2020   Chronic kidney disease (CKD) stage G3b/A1, moderately decreased glomerular filtration rate (GFR) between 30-44 mL/min/1.73 square meter and albuminuria creatinine ratio less than 30 mg/g ([place]) 07/24/2020   Tachycardia 01/11/2019   Fatty liver  06/21/2018   Obesity (BMI 30-39.9) 06/21/2018   Dyspepsia 05/25/2018   GERD (gastroesophageal reflux disease) 05/25/2018   H/O vertigo 05/11/2018   Thrombocytosis 12/25/2017   Tear of medial meniscus of knee 12/21/2017   Pain in left knee 12/21/2017   Left knee pain 09/21/2017   Welcome to Medicare preventive visit 12/17/2016   Grief reaction 12/17/2016   Estrogen deficiency 09/08/2016   Myalgia 02/08/2016   Normocytic anemia 02/09/2013   Abnormal EKG 07/27/2012   Special screening for malignant neoplasms, colon 04/29/2011   Gynecological examination 04/29/2011   Routine general medical examination at a health care facility 04/20/2011   OSTEOARTHRITIS, HANDS, BILATERAL 10/31/2008   Diabetes type 2, controlled ([place]) 07/10/2008   Hyperlipidemia associated with type 2 diabetes mellitus ([place]) 01/26/2008   Essential hypertension 01/26/2008   ALLERGIC RHINITIS 12/07/2007   Past Medical History:  Diagnosis Date   Allergic rhinitis    Breast cancer ([place]) 1996   right breast   DM2 (diabetes mellitus, type 2) ([place])    GERD (gastroesophageal reflux disease)    Hx of cardiovascular stress test    a. Lex MV 2/14:  EF 69%, no ischemia  Hyperlipidemia    Hypertension    IDA (iron deficiency anemia) 08/27/2020   Iron deficiency anemia    Obesity    Osteoarthritis    hands   Ulcer    Past Surgical History:  Procedure Laterality Date   APPENDECTOMY  1976   BREAST LUMPECTOMY Right 1996   right breast, lumpectomy w/nodes   CATARACT EXTRACTION W/PHACO Left 11/28/2020   Procedure: CATARACT EXTRACTION PHACO AND INTRAOCULAR LENS PLACEMENT ([place]) LEFT DIABETIC;  Surgeon: Leandrew Koyanagi, MD;  Location: [place];  Service: Ophthalmology;  Laterality: Left;  5.02 01:07.4 7.4%   CATARACT EXTRACTION W/PHACO Right 12/12/2020   Procedure: CATARACT EXTRACTION PHACO AND INTRAOCULAR LENS PLACEMENT ([facility]) RIGHT DIABETIC 5.22 00:59.2;  Surgeon: Leandrew Koyanagi, MD;  Location: [place];  Service: Ophthalmology;  Laterality: Right;   CHONDROPLASTY Left 02/10/2018   Procedure: CHONDROPLASTY;  Surgeon: Lovell Sheehan, MD;  Location: [facility];  Service: Orthopedics;  Laterality: Left;   GASTRIC RESTRICTION SURGERY     for reflux   HYSTEROSCOPY WITH D & C N/A 10/18/2012   Procedure: DILATATION AND CURETTAGE /HYSTEROSCOPY;  Surgeon: Mora Bellman, MD;  Location: [facility];  Service: Gynecology;  Laterality: N/A;   KNEE ARTHROSCOPY WITH MEDIAL MENISECTOMY Left 02/10/2018   Procedure: KNEE ARTHROSCOPY WITH MEDIAL and LATERAL MENISECTOMY;  Surgeon: Lovell Sheehan, MD;  Location: [facility];  Service: Orthopedics;  Laterality: Left;   POLYPECTOMY N/A 10/18/2012   Procedure: POLYPECTOMY;  Surgeon: Mora Bellman, MD;  Location: [facility];  Service: Gynecology;  Laterality: N/A;   SYNOVECTOMY Left 02/10/2018   Procedure: SYNOVECTOMY;  Surgeon: Lovell Sheehan, MD;  Location: [facility];  Service: Orthopedics;  Laterality: Left;   Social History   Tobacco Use   Smoking status: Never   Smokeless tobacco: Never  Vaping Use   Vaping Use: Never used  Substance Use Topics   Alcohol use: Never    Alcohol/week: 0.0 standard drinks of alcohol   Drug use: No   Family History  Problem Relation Age of Onset   Diabetes Mother    Heart disease Mother        Pacemaker, CHF   Hypertension Mother    Kidney cancer Mother    Stroke Father    CAD Father 53       Died with MI   Esophageal cancer Brother    Heart disease Sister    Colon cancer Neg Hx    Rectal cancer Neg Hx    Stomach cancer Neg Hx    Pancreatic cancer Neg Hx    Allergies  Allergen Reactions   Cholestyramine Other (See Comments)    REACTION: Muscles tightened up, drew up.   Simvastatin Other (See Comments)    Muscular pain   Lipitor [Atorvastatin] Other (See Comments)    Muscle cramping   Niacin Nausea And Vomiting   Current Outpatient Medications on File Prior to Visit  Medication Sig Dispense Refill    ACCU-CHEK AVIVA PLUS test strip TEST BLOOD SUGAR EVERY DAY FOR DIABETES 100 strip 1   Accu-Chek Softclix Lancets lancets TEST BLOOD SUGAR EVERY DAY FOR DIABETES 100 each 1   acetaminophen (TYLENOL) 500 MG tablet Take 1,000 mg by mouth every 6 (six) hours as needed for mild pain.      Alcohol Swabs (DROPSAFE ALCOHOL PREP) 70 % PADS USE ONE TIME DAILY AS DIRECTED  WHEN  CHECKING BLOOD SUGAR 100 each 1   amitriptyline (ELAVIL) 25 MG tablet Take 1 tablet (25 mg total)  by mouth at bedtime. 90 tablet 3   aspirin 81 MG tablet Take 81 mg by mouth at bedtime.      Blood Glucose Calibration (ACCU-CHEK AVIVA) SOLN USE TO CALIBRATE METER AS DIRECTED 1 each 2   Blood Glucose Monitoring Suppl (ACCU-CHEK AVIVA PLUS) w/Device KIT Use to check blood sugar once daily for DM (dx. E11.9) 1 kit 0   calcium elemental as carbonate (BARIATRIC TUMS ULTRA) 400 MG chewable tablet Chew 1,000 mg by mouth daily as needed for heartburn.     Camphor-Menthol-Methyl Sal (SALONPAS) 3.07-19-08 % PTCH Apply topically.     celecoxib (CELEBREX) 200 MG capsule      Cholecalciferol (VITAMIN D) 2000 units tablet Take 2,000 Units by mouth daily.     Cyanocobalamin (VITAMIN B-12) 1000 MCG SUBL Place 1 tablet (1,000 mcg total) under the tongue daily. 90 tablet 1   Docusate Sodium (DSS) 100 MG CAPS Take 1 capsule by mouth as needed.     ezetimibe (ZETIA) 10 MG tablet Take 1 tablet (10 mg total) by mouth daily. 30 tablet 3   fenofibrate (TRICOR) 145 MG tablet TAKE 1 TABLET EVERY DAY 90 tablet 1   glipiZIDE (GLUCOTROL XL) 5 MG 24 hr tablet Take 1 tablet (5 mg total) by mouth daily with breakfast. 90 tablet 3   loratadine (CLARITIN) 10 MG tablet Take 10 mg by mouth daily as needed for allergies.     losartan (COZAAR) 50 MG tablet Take 1 tablet (50 mg total) by mouth daily. 90 tablet 3   meclizine (ANTIVERT) 25 MG tablet Take 1 tablet (25 mg total) by mouth 3 (three) times daily as needed for dizziness. 90 tablet 3   metFORMIN (GLUCOPHAGE) 1000 MG  tablet Take 0.5 tablets (500 mg total) by mouth 2 (two) times daily with a meal. 90 tablet 3   metoprolol succinate (TOPROL-XL) 50 MG 24 hr tablet Take 1 tablet (50 mg total) by mouth daily. Take with or immediately following a meal. 90 tablet 3   omeprazole (PRILOSEC) 40 MG capsule TAKE 1 CAPSULE IN THE MORNING AND AT BEDTIME. 180 capsule 3   ondansetron (ZOFRAN) 4 MG tablet TAKE 1 TABLET EVERY 4 HOURS AS NEEDED FOR NAUSEA AND VOMITING 90 tablet 0   SALINE NASAL SPRAY NA Place 1 spray into the nose daily as needed (congestion).      No current facility-administered medications on file prior to visit.    Review of Systems  Constitutional:  Negative for activity change, appetite change, fatigue, fever and unexpected weight change.  HENT:  Negative for congestion, ear pain, rhinorrhea, sinus pressure and sore throat.   Eyes:  Negative for pain, redness and visual disturbance.  Respiratory:  Negative for cough, shortness of breath and wheezing.   Cardiovascular:  Negative for chest pain and palpitations.  Gastrointestinal:  Negative for abdominal pain, blood in stool, constipation and diarrhea.  Endocrine: Negative for polydipsia and polyuria.  Genitourinary:  Negative for dysuria, frequency and urgency.  Musculoskeletal:  Positive for arthralgias, back pain, myalgias and neck pain. Negative for joint swelling.  Skin:  Negative for pallor and rash.  Allergic/Immunologic: Negative for environmental allergies.  Neurological:  Negative for dizziness, syncope and headaches.  Hematological:  Negative for adenopathy. Does not bruise/bleed easily.  Psychiatric/Behavioral:  Negative for decreased concentration and dysphoric mood. The patient is not nervous/anxious.        Objective:   Physical Exam Constitutional:      General: She is not in acute distress.  Appearance: Normal appearance. She is well-developed. She is obese. She is not ill-appearing or diaphoretic.  HENT:     Head:  Normocephalic and atraumatic.  Eyes:     Conjunctiva/sclera: Conjunctivae normal.     Pupils: Pupils are equal, round, and reactive to light.  Neck:     Thyroid: No thyromegaly.     Vascular: No carotid bruit or JVD.     Comments: Nl rom neck Some discomfort with full extension  Some R sided muscle tenderness Cardiovascular:     Rate and Rhythm: Normal rate and regular rhythm.     Heart sounds: Normal heart sounds.     No gallop.  Pulmonary:     Effort: Pulmonary effort is normal. No respiratory distress.     Breath sounds: Normal breath sounds. No wheezing or rales.  Abdominal:     General: There is no distension or abdominal bruit.     Palpations: Abdomen is soft.  Musculoskeletal:     Right shoulder: Tenderness present. No swelling, deformity or crepitus. Decreased range of motion. Normal strength. Normal pulse.     Left shoulder: Normal.     Right forearm: Tenderness present. No swelling or edema.     Left forearm: Tenderness present. No swelling or edema.     Cervical back: Normal range of motion and neck supple.     Right lower leg: No edema.     Left lower leg: No edema.     Comments: R shoulder- negative hawking and neer tests Some pain with full int/ext rotation  Slt acromion tenderness  Some tenderness in trap muscle worse on R Tender forearms bilat Tender legs- calf and shin bilaterally  No LS tenderness-nl rom   Lymphadenopathy:     Cervical: No cervical adenopathy.  Skin:    General: Skin is warm and dry.     Coloration: Skin is not pale.     Findings: No rash.  Neurological:     Mental Status: She is alert.     Coordination: Coordination normal.     Deep Tendon Reflexes: Reflexes are normal and symmetric. Reflexes normal.  Psychiatric:        Mood and Affect: Mood normal.           Assessment & Plan:   Problem List Items Addressed This Visit       Other   Muscle cramps    In setting of some leg/arm aches Cramps primarily occur in RLE in the  night   Suggested trial of mag 250 mg at bedtime if tolerated  Mag level ordered  Enc good hydration and stretching       Relevant Orders   Sedimentation Rate   CBC with Differential/Platelet   Comprehensive metabolic panel   Magnesium   Myalgia - Primary    achey pain in legs and arms and shoulder girdle  Some tenderness in calf and forearm bilat  No statin (takes tricor- less likely to cause myalgia)  Labs ordered incl esr to r/o pmr and magnesium  Pend result  If neg would enc stretching  Consider muscle relaxer       Relevant Orders   Sedimentation Rate   CBC with Differential/Platelet   Comprehensive metabolic panel   Magnesium   Neck pain    R side of neck/ trap and shoulder bother her  Some pain with rom of neck and shoulder but no rotator cuff signs   CS xray today  See a/p for myalgia  Relevant Orders   DG Cervical Spine Complete

## 2022-02-28 NOTE — Assessment & Plan Note (Signed)
R side of neck/ trap and shoulder bother her  Some pain with rom of neck and shoulder but no rotator cuff signs   CS xray today  See a/p for myalgia

## 2022-02-28 NOTE — Assessment & Plan Note (Signed)
In setting of some leg/arm aches Cramps primarily occur in RLE in the night   Suggested trial of mag 250 mg at bedtime if tolerated  Mag level ordered  Enc good hydration and stretching

## 2022-03-03 ENCOUNTER — Telehealth: Payer: Self-pay

## 2022-03-03 DIAGNOSIS — M542 Cervicalgia: Secondary | ICD-10-CM

## 2022-03-03 DIAGNOSIS — M47812 Spondylosis without myelopathy or radiculopathy, cervical region: Secondary | ICD-10-CM | POA: Insufficient documentation

## 2022-03-03 DIAGNOSIS — M5030 Other cervical disc degeneration, unspecified cervical region: Secondary | ICD-10-CM | POA: Insufficient documentation

## 2022-03-03 DIAGNOSIS — R252 Cramp and spasm: Secondary | ICD-10-CM

## 2022-03-03 DIAGNOSIS — M791 Myalgia, unspecified site: Secondary | ICD-10-CM

## 2022-03-03 NOTE — Telephone Encounter (Signed)
The referral is in  Thanks for letting me know

## 2022-03-03 NOTE — Telephone Encounter (Signed)
Forwarding

## 2022-03-03 NOTE — Telephone Encounter (Signed)
Called and notified of her referral sent to ortho. Pt wanted to know if she change the mg Magnesium '200mg'$   she when down on the dose or could be that it is soft gel. Pt wanted to know if that will help with the diaherra.

## 2022-03-03 NOTE — Telephone Encounter (Addendum)
Patient called in stating she seen the comments on the results of the xray. Would like to go to [facility] in [place] if possible. Patient has also tried Magnesium but it gave her diarrhea and does not want to continue taking it.

## 2022-03-03 NOTE — Telephone Encounter (Signed)
Called and notified pt of this  

## 2022-03-03 NOTE — Telephone Encounter (Signed)
If magnesium gives her diarrhea at 200 or 250 mg dose she can try (if she gets a tablet form) to cut in 1/2  Some folks just get diarrhea at any dose

## 2022-03-06 ENCOUNTER — Inpatient Hospital Stay: Payer: Medicare PPO | Attending: Oncology

## 2022-03-06 DIAGNOSIS — D508 Other iron deficiency anemias: Secondary | ICD-10-CM

## 2022-03-06 DIAGNOSIS — E538 Deficiency of other specified B group vitamins: Secondary | ICD-10-CM | POA: Insufficient documentation

## 2022-03-06 MED ORDER — CYANOCOBALAMIN 1000 MCG/ML IJ SOLN
1000.0000 ug | Freq: Once | INTRAMUSCULAR | Status: AC
  Administered 2022-03-06: 1000 ug via INTRAMUSCULAR
  Filled 2022-03-06: qty 1

## 2022-03-07 DIAGNOSIS — M5412 Radiculopathy, cervical region: Secondary | ICD-10-CM | POA: Diagnosis not present

## 2022-03-18 ENCOUNTER — Encounter: Payer: Self-pay | Admitting: Oncology

## 2022-03-20 ENCOUNTER — Ambulatory Visit: Payer: Medicare PPO | Admitting: Family Medicine

## 2022-03-20 ENCOUNTER — Encounter: Payer: Self-pay | Admitting: Family Medicine

## 2022-03-20 VITALS — BP 134/62 | HR 95 | Temp 98.0°F | Ht 59.5 in | Wt 172.0 lb

## 2022-03-20 DIAGNOSIS — E785 Hyperlipidemia, unspecified: Secondary | ICD-10-CM

## 2022-03-20 DIAGNOSIS — E1169 Type 2 diabetes mellitus with other specified complication: Secondary | ICD-10-CM | POA: Diagnosis not present

## 2022-03-20 DIAGNOSIS — Z23 Encounter for immunization: Secondary | ICD-10-CM | POA: Diagnosis not present

## 2022-03-20 DIAGNOSIS — E119 Type 2 diabetes mellitus without complications: Secondary | ICD-10-CM | POA: Diagnosis not present

## 2022-03-20 DIAGNOSIS — I10 Essential (primary) hypertension: Secondary | ICD-10-CM

## 2022-03-20 LAB — BASIC METABOLIC PANEL
BUN: 31 mg/dL — ABNORMAL HIGH (ref 6–23)
CO2: 24 mEq/L (ref 19–32)
Calcium: 9.4 mg/dL (ref 8.4–10.5)
Chloride: 101 mEq/L (ref 96–112)
Creatinine, Ser: 1.07 mg/dL (ref 0.40–1.20)
GFR: 52.39 mL/min — ABNORMAL LOW (ref >60.00)
Glucose, Bld: 125 mg/dL — ABNORMAL HIGH (ref 70–99)
Potassium: 4.2 mEq/L (ref 3.5–5.1)
Sodium: 137 mEq/L (ref 135–145)

## 2022-03-20 LAB — HEMOGLOBIN A1C: Hgb A1c MFr Bld: 6.8 % — ABNORMAL HIGH (ref 4.6–6.5)

## 2022-03-20 NOTE — Patient Instructions (Addendum)
Look for chair yoga videos on line  May be easier on your body than walking and may help stiffness   Blood pressure is better Labs today  Work on fluid intake for kidney health   Take care of yourself

## 2022-03-20 NOTE — Assessment & Plan Note (Signed)
bp in fair control at this time  BP Readings from Last 1 Encounters:  03/20/22 134/62   No changes needed Most recent labs reviewed  Disc lifstyle change with low sodium diet and exercise  Improved with addn of losartan 50 mg daily  Continues metoprolol xl 50 mg daily  Lab today

## 2022-03-20 NOTE — Assessment & Plan Note (Signed)
Disc goals for lipids and reasons to control them Rev last labs with pt Rev low sat fat diet in detail Now on zetia 10 mg (intol of statins) from cardiology  This will be checked later by cardiology as planned

## 2022-03-20 NOTE — Progress Notes (Signed)
Subjective:    Patient ID: Martha Hamilton, female    DOB: November 08, 1950, 71 y.o.   MRN: [id number]  HPI Pt presents for f/u of HTN and chronic medical problems    Wt Readings from Last 3 Encounters:  03/20/22 172 lb (78 kg)  02/28/22 169 lb 6.4 oz (76.8 kg)  02/27/22 168 lb 6 oz (76.4 kg)   34.16 kg/m   HTN bp is stable today  No cp or palpitations or headaches or edema  No side effects to medicines  BP Readings from Last 3 Encounters:  03/20/22 134/62  02/28/22 138/74  02/27/22 (!) 144/80     Last visit made plan to re start losartan 50 mg daily   Watching renal numbers  Fluid intake - not as much as she was /trying to  Is hard to get enough- makes her have to urinate (also the gets busy and not thirsty)   Wants to get back to walking Not that steady  Joint stiffness  Lot of walking in the house (does her own house car)     Takes metoprolol xl 50 mg daily   Pulse Readings from Last 3 Encounters:  03/20/22 95  02/28/22 96  02/27/22 (!) 105      DM2 Lab Results  Component Value Date   HGBA1C 6.3 12/11/2021    Metformin 500 mg bid  Glipizide xl 5 mg daily   Is eating healthy    Eye exam 10/2021  Arb  Cannot tolerate statin  Now on zetia 10 mg - started last month   Lab Results  Component Value Date   CHOL 176 12/11/2021   HDL 38.80 (L) 12/11/2021   LDLCALC 51 10/21/2011   LDLDIRECT 89.0 12/11/2021   TRIG 396.0 (H) 12/11/2021   CHOLHDL 5 12/11/2021     Lab Results  Component Value Date   CREATININE 1.10 02/28/2022   BUN 20 02/28/2022   NA 137 02/28/2022   K 4.3 02/28/2022   CL 102 02/28/2022   CO2 26 02/28/2022   Lab Results  Component Value Date   ALT 15 02/28/2022   AST 17 02/28/2022   ALKPHOS 40 02/28/2022   BILITOT 0.2 02/28/2022   Lab Results  Component Value Date   ESRSEDRATE 23 02/28/2022    Patient Active Problem List   Diagnosis Date Noted   Degenerative disc disease, cervical 03/03/2022   Muscle cramps  02/28/2022   Neck pain 02/28/2022   Syncope 12/18/2021   Strain of flexor tendon of wrist 12/02/2021   Tendinitis of left wrist 12/02/2021   Left wrist pain 11/13/2021   Family history of cancer 08/27/2020   History of breast cancer 08/27/2020   IDA (iron deficiency anemia) 08/27/2020   B12 deficiency 08/27/2020   Chronic kidney disease (CKD) stage G3b/A1, moderately decreased glomerular filtration rate (GFR) between 30-44 mL/min/1.73 square meter and albuminuria creatinine ratio less than 30 mg/g ([place]) 07/24/2020   Tachycardia 01/11/2019   Fatty liver 06/21/2018   Obesity (BMI 30-39.9) 06/21/2018   Dyspepsia 05/25/2018   GERD (gastroesophageal reflux disease) 05/25/2018   H/O vertigo 05/11/2018   Thrombocytosis 12/25/2017   Tear of medial meniscus of knee 12/21/2017   Pain in left knee 12/21/2017   Left knee pain 09/21/2017   Welcome to Medicare preventive visit 12/17/2016   Grief reaction 12/17/2016   Estrogen deficiency 09/08/2016   Myalgia 02/08/2016   Normocytic anemia 02/09/2013   Abnormal EKG 07/27/2012   Special screening for malignant neoplasms, colon 04/29/2011  Gynecological examination 04/29/2011   Routine general medical examination at a health care facility 04/20/2011   OSTEOARTHRITIS, HANDS, BILATERAL 10/31/2008   Diabetes type 2, controlled ([place]) 07/10/2008   Hyperlipidemia associated with type 2 diabetes mellitus ([place]) 01/26/2008   Essential hypertension 01/26/2008   ALLERGIC RHINITIS 12/07/2007   Past Medical History:  Diagnosis Date   Allergic rhinitis    Breast cancer ([place]) 1996   right breast   DM2 (diabetes mellitus, type 2) ([place])    GERD (gastroesophageal reflux disease)    Hx of cardiovascular stress test    a. Lex MV 2/14:  EF 69%, no ischemia   Hyperlipidemia    Hypertension    IDA (iron deficiency anemia) 08/27/2020   Iron deficiency anemia    Obesity    Osteoarthritis    hands   Ulcer    Past Surgical History:  Procedure Laterality  Date   APPENDECTOMY  1976   BREAST LUMPECTOMY Right 1996   right breast, lumpectomy w/nodes   CATARACT EXTRACTION W/PHACO Left 11/28/2020   Procedure: CATARACT EXTRACTION PHACO AND INTRAOCULAR LENS PLACEMENT ([place]) LEFT DIABETIC;  Surgeon: Leandrew Koyanagi, MD;  Location: [facility];  Service: Ophthalmology;  Laterality: Left;  5.02 01:07.4 7.4%   CATARACT EXTRACTION W/PHACO Right 12/12/2020   Procedure: CATARACT EXTRACTION PHACO AND INTRAOCULAR LENS PLACEMENT ([facility]) RIGHT DIABETIC 5.22 00:59.2;  Surgeon: Leandrew Koyanagi, MD;  Location: [facility];  Service: Ophthalmology;  Laterality: Right;   CHONDROPLASTY Left 02/10/2018   Procedure: CHONDROPLASTY;  Surgeon: Lovell Sheehan, MD;  Location: [facility];  Service: Orthopedics;  Laterality: Left;   GASTRIC RESTRICTION SURGERY     for reflux   HYSTEROSCOPY WITH D & C N/A 10/18/2012   Procedure: DILATATION AND CURETTAGE /HYSTEROSCOPY;  Surgeon: Mora Bellman, MD;  Location: [facility];  Service: Gynecology;  Laterality: N/A;   KNEE ARTHROSCOPY WITH MEDIAL MENISECTOMY Left 02/10/2018   Procedure: KNEE ARTHROSCOPY WITH MEDIAL and LATERAL MENISECTOMY;  Surgeon: Lovell Sheehan, MD;  Location: [facility];  Service: Orthopedics;  Laterality: Left;   POLYPECTOMY N/A 10/18/2012   Procedure: POLYPECTOMY;  Surgeon: Mora Bellman, MD;  Location: [facility];  Service: Gynecology;  Laterality: N/A;   SYNOVECTOMY Left 02/10/2018   Procedure: SYNOVECTOMY;  Surgeon: Lovell Sheehan, MD;  Location: [facility];  Service: Orthopedics;  Laterality: Left;   Social History   Tobacco Use   Smoking status: Never   Smokeless tobacco: Never  Vaping Use   Vaping Use: Never used  Substance Use Topics   Alcohol use: Never    Alcohol/week: 0.0 standard drinks of alcohol   Drug use: No   Family History  Problem Relation Age of Onset   Diabetes Mother    Heart disease Mother        Pacemaker, CHF   Hypertension Mother    Kidney cancer Mother    Stroke  Father    CAD Father 63       Died with MI   Esophageal cancer Brother    Heart disease Sister    Colon cancer Neg Hx    Rectal cancer Neg Hx    Stomach cancer Neg Hx    Pancreatic cancer Neg Hx    Allergies  Allergen Reactions   Cholestyramine Other (See Comments)    REACTION: Muscles tightened up, drew up.   Simvastatin Other (See Comments)    Muscular pain   Lipitor [Atorvastatin] Other (See Comments)    Muscle cramping   Niacin Nausea And Vomiting  Current Outpatient Medications on File Prior to Visit  Medication Sig Dispense Refill   ACCU-CHEK AVIVA PLUS test strip TEST BLOOD SUGAR EVERY DAY FOR DIABETES 100 strip 1   Accu-Chek Softclix Lancets lancets TEST BLOOD SUGAR EVERY DAY FOR DIABETES 100 each 1   acetaminophen (TYLENOL) 500 MG tablet Take 1,000 mg by mouth every 6 (six) hours as needed for mild pain.      Alcohol Swabs (DROPSAFE ALCOHOL PREP) 70 % PADS USE ONE TIME DAILY AS DIRECTED  WHEN  CHECKING BLOOD SUGAR 100 each 1   amitriptyline (ELAVIL) 25 MG tablet Take 1 tablet (25 mg total) by mouth at bedtime. 90 tablet 3   aspirin 81 MG tablet Take 81 mg by mouth at bedtime.      Blood Glucose Calibration (ACCU-CHEK AVIVA) SOLN USE TO CALIBRATE METER AS DIRECTED 1 each 2   Blood Glucose Monitoring Suppl (ACCU-CHEK AVIVA PLUS) w/Device KIT Use to check blood sugar once daily for DM (dx. E11.9) 1 kit 0   calcium elemental as carbonate (BARIATRIC TUMS ULTRA) 400 MG chewable tablet Chew 1,000 mg by mouth daily as needed for heartburn.     Camphor-Menthol-Methyl Sal (SALONPAS) 3.07-19-08 % PTCH Apply topically.     celecoxib (CELEBREX) 200 MG capsule      Cholecalciferol (VITAMIN D) 2000 units tablet Take 2,000 Units by mouth daily.     Cyanocobalamin (VITAMIN B-12) 1000 MCG SUBL Place 1 tablet (1,000 mcg total) under the tongue daily. 90 tablet 1   Docusate Sodium (DSS) 100 MG CAPS Take 1 capsule by mouth as needed.     ezetimibe (ZETIA) 10 MG tablet Take 1 tablet (10 mg  total) by mouth daily. 30 tablet 3   fenofibrate (TRICOR) 145 MG tablet TAKE 1 TABLET EVERY DAY 90 tablet 1   gabapentin (NEURONTIN) 300 MG capsule Take 300 mg by mouth 3 (three) times daily as needed.     glipiZIDE (GLUCOTROL XL) 5 MG 24 hr tablet Take 1 tablet (5 mg total) by mouth daily with breakfast. 90 tablet 3   loratadine (CLARITIN) 10 MG tablet Take 10 mg by mouth daily as needed for allergies.     losartan (COZAAR) 50 MG tablet Take 1 tablet (50 mg total) by mouth daily. 90 tablet 3   meclizine (ANTIVERT) 25 MG tablet Take 1 tablet (25 mg total) by mouth 3 (three) times daily as needed for dizziness. 90 tablet 3   metFORMIN (GLUCOPHAGE) 1000 MG tablet Take 0.5 tablets (500 mg total) by mouth 2 (two) times daily with a meal. 90 tablet 3   metoprolol succinate (TOPROL-XL) 50 MG 24 hr tablet Take 1 tablet (50 mg total) by mouth daily. Take with or immediately following a meal. 90 tablet 3   omeprazole (PRILOSEC) 40 MG capsule TAKE 1 CAPSULE IN THE MORNING AND AT BEDTIME. 180 capsule 3   ondansetron (ZOFRAN) 4 MG tablet TAKE 1 TABLET EVERY 4 HOURS AS NEEDED FOR NAUSEA AND VOMITING 90 tablet 0   SALINE NASAL SPRAY NA Place 1 spray into the nose daily as needed (congestion).      No current facility-administered medications on file prior to visit.    Review of Systems  Constitutional:  Negative for activity change, appetite change, fatigue, fever and unexpected weight change.  HENT:  Negative for congestion, ear pain, rhinorrhea, sinus pressure and sore throat.   Eyes:  Negative for pain, redness and visual disturbance.  Respiratory:  Negative for cough, shortness of breath and wheezing.  Cardiovascular:  Negative for chest pain and palpitations.  Gastrointestinal:  Negative for abdominal pain, blood in stool, constipation and diarrhea.  Endocrine: Negative for polydipsia and polyuria.  Genitourinary:  Negative for dysuria, frequency and urgency.  Musculoskeletal:  Negative for  arthralgias, back pain and myalgias.  Skin:  Negative for pallor and rash.  Allergic/Immunologic: Negative for environmental allergies.  Neurological:  Negative for dizziness, syncope and headaches.  Hematological:  Negative for adenopathy. Does not bruise/bleed easily.  Psychiatric/Behavioral:  Negative for decreased concentration and dysphoric mood. The patient is not nervous/anxious.        Objective:   Physical Exam Constitutional:      General: She is not in acute distress.    Appearance: Normal appearance. She is well-developed. She is obese. She is not ill-appearing or diaphoretic.  HENT:     Head: Normocephalic and atraumatic.  Eyes:     Conjunctiva/sclera: Conjunctivae normal.     Pupils: Pupils are equal, round, and reactive to light.  Neck:     Thyroid: No thyromegaly.     Vascular: No carotid bruit or JVD.  Cardiovascular:     Rate and Rhythm: Regular rhythm. Tachycardia present.     Heart sounds: Normal heart sounds.     No gallop.  Pulmonary:     Effort: Pulmonary effort is normal. No respiratory distress.     Breath sounds: Normal breath sounds. No wheezing or rales.  Abdominal:     General: There is no distension or abdominal bruit.     Palpations: Abdomen is soft.  Musculoskeletal:     Cervical back: Normal range of motion and neck supple.     Right lower leg: No edema.     Left lower leg: No edema.  Lymphadenopathy:     Cervical: No cervical adenopathy.  Skin:    General: Skin is warm and dry.     Coloration: Skin is not pale.     Findings: No rash.  Neurological:     Mental Status: She is alert.     Coordination: Coordination normal.     Deep Tendon Reflexes: Reflexes are normal and symmetric. Reflexes normal.  Psychiatric:        Mood and Affect: Mood normal.           Assessment & Plan:   Problem List Items Addressed This Visit       Cardiovascular and Mediastinum   Essential hypertension - Primary    bp in fair control at this time   BP Readings from Last 1 Encounters:  03/20/22 134/62  No changes needed Most recent labs reviewed  Disc lifstyle change with low sodium diet and exercise  Improved with addn of losartan 50 mg daily  Continues metoprolol xl 50 mg daily  Lab today       Relevant Orders   Basic metabolic panel (Completed)     Endocrine   Diabetes type 2, controlled ([place])    a1c today Has been well controlled Metformin 500 mg bid Glipizide xl 5 mg daily  Eye exam utd  On arb Intol of statin-now on zetia  Enc low glycemic diet       Relevant Orders   Hemoglobin A1c (Completed)   Hyperlipidemia associated with type 2 diabetes mellitus ([place])    Disc goals for lipids and reasons to control them Rev last labs with pt Rev low sat fat diet in detail Now on zetia 10 mg (intol of statins) from cardiology  This will be checked  later by cardiology as planned       Relevant Orders   Basic metabolic panel (Completed)   Other Visit Diagnoses     Need for influenza vaccination       Relevant Orders   Flu Vaccine QUAD High Dose([facility]) (Completed)

## 2022-03-20 NOTE — Assessment & Plan Note (Signed)
a1c today Has been well controlled Metformin 500 mg bid Glipizide xl 5 mg daily  Eye exam utd  On arb Intol of statin-now on zetia  Enc low glycemic diet

## 2022-03-27 ENCOUNTER — Other Ambulatory Visit: Payer: Self-pay | Admitting: Family Medicine

## 2022-03-27 DIAGNOSIS — Z1231 Encounter for screening mammogram for malignant neoplasm of breast: Secondary | ICD-10-CM

## 2022-04-01 DIAGNOSIS — M5412 Radiculopathy, cervical region: Secondary | ICD-10-CM | POA: Diagnosis not present

## 2022-04-04 ENCOUNTER — Ambulatory Visit: Payer: Medicare PPO | Admitting: Podiatry

## 2022-04-04 DIAGNOSIS — M5412 Radiculopathy, cervical region: Secondary | ICD-10-CM | POA: Diagnosis not present

## 2022-04-07 ENCOUNTER — Inpatient Hospital Stay: Payer: Medicare PPO | Attending: Oncology

## 2022-04-07 DIAGNOSIS — D508 Other iron deficiency anemias: Secondary | ICD-10-CM

## 2022-04-07 DIAGNOSIS — E538 Deficiency of other specified B group vitamins: Secondary | ICD-10-CM | POA: Diagnosis not present

## 2022-04-07 MED ORDER — CYANOCOBALAMIN 1000 MCG/ML IJ SOLN
1000.0000 ug | Freq: Once | INTRAMUSCULAR | Status: AC
  Administered 2022-04-07: 1000 ug via INTRAMUSCULAR
  Filled 2022-04-07: qty 1

## 2022-04-13 ENCOUNTER — Encounter: Payer: Self-pay | Admitting: Oncology

## 2022-04-14 NOTE — Telephone Encounter (Signed)
Please schedule patient for labs the week of October 16 (not thurs).

## 2022-04-15 ENCOUNTER — Ambulatory Visit: Payer: Medicare PPO | Admitting: Podiatry

## 2022-04-15 ENCOUNTER — Ambulatory Visit: Payer: Medicare PPO

## 2022-04-15 ENCOUNTER — Ambulatory Visit (INDEPENDENT_AMBULATORY_CARE_PROVIDER_SITE_OTHER): Payer: Medicare PPO

## 2022-04-15 DIAGNOSIS — M778 Other enthesopathies, not elsewhere classified: Secondary | ICD-10-CM

## 2022-04-15 MED ORDER — MELOXICAM 15 MG PO TABS: 15.0000 mg | ORAL_TABLET | Freq: Every day | ORAL | 1 refills | Status: DC

## 2022-04-15 MED ORDER — METHYLPREDNISOLONE 4 MG PO TBPK: ORAL_TABLET | ORAL | 0 refills | Status: DC

## 2022-04-15 MED ORDER — BETAMETHASONE SOD PHOS & ACET 6 (3-3) MG/ML IJ SUSP
3.0000 mg | Freq: Once | INTRAMUSCULAR | Status: AC
  Administered 2022-04-15: 3 mg via INTRA_ARTICULAR

## 2022-04-15 NOTE — Progress Notes (Signed)
Left

## 2022-04-15 NOTE — Progress Notes (Signed)
Chief Complaint  Patient presents with   left foot swelling    Patient states that she has left foot swelling and has had injection that really helped and lasted for a long time, now she is starting to have pain again.    HPI: 71 y.o. female presenting today for recurrence of pain and tenderness to the lateral aspect of the left foot.  Patient was last seen in the office on 01/22/2021.  Prior to that time she received a cortisone injection with anti-inflammatory and she says that her foot has been feeling well up until a few weeks ago.  She denies any new recent injury or history of trauma.  She presents for further treatment and evaluation.  Currently she has not done anything for treatment  Past Medical History:  Diagnosis Date   Allergic rhinitis    Breast cancer ([place]) 1996   right breast   DM2 (diabetes mellitus, type 2) ([place])    GERD (gastroesophageal reflux disease)    Hx of cardiovascular stress test    a. Lex MV 2/14:  EF 69%, no ischemia   Hyperlipidemia    Hypertension    IDA (iron deficiency anemia) 08/27/2020   Iron deficiency anemia    Obesity    Osteoarthritis    hands   Ulcer     Past Surgical History:  Procedure Laterality Date   APPENDECTOMY  1976   BREAST LUMPECTOMY Right 1996   right breast, lumpectomy w/nodes   CATARACT EXTRACTION W/PHACO Left 11/28/2020   Procedure: CATARACT EXTRACTION PHACO AND INTRAOCULAR LENS PLACEMENT ([place]) LEFT DIABETIC;  Surgeon: Leandrew Koyanagi, MD;  Location: [facility];  Service: Ophthalmology;  Laterality: Left;  5.02 01:07.4 7.4%   CATARACT EXTRACTION W/PHACO Right 12/12/2020   Procedure: CATARACT EXTRACTION PHACO AND INTRAOCULAR LENS PLACEMENT ([facility]) RIGHT DIABETIC 5.22 00:59.2;  Surgeon: Leandrew Koyanagi, MD;  Location: [facility];  Service: Ophthalmology;  Laterality: Right;   CHONDROPLASTY Left 02/10/2018   Procedure: CHONDROPLASTY;  Surgeon: Lovell Sheehan, MD;  Location: [facility];  Service:  Orthopedics;  Laterality: Left;   GASTRIC RESTRICTION SURGERY     for reflux   HYSTEROSCOPY WITH D & C N/A 10/18/2012   Procedure: DILATATION AND CURETTAGE /HYSTEROSCOPY;  Surgeon: Mora Bellman, MD;  Location: [facility];  Service: Gynecology;  Laterality: N/A;   KNEE ARTHROSCOPY WITH MEDIAL MENISECTOMY Left 02/10/2018   Procedure: KNEE ARTHROSCOPY WITH MEDIAL and LATERAL MENISECTOMY;  Surgeon: Lovell Sheehan, MD;  Location: [facility];  Service: Orthopedics;  Laterality: Left;   POLYPECTOMY N/A 10/18/2012   Procedure: POLYPECTOMY;  Surgeon: Mora Bellman, MD;  Location: [facility];  Service: Gynecology;  Laterality: N/A;   SYNOVECTOMY Left 02/10/2018   Procedure: SYNOVECTOMY;  Surgeon: Lovell Sheehan, MD;  Location: [facility];  Service: Orthopedics;  Laterality: Left;    Allergies  Allergen Reactions   Cholestyramine Other (See Comments)    REACTION: Muscles tightened up, drew up.   Simvastatin Other (See Comments)    Muscular pain   Lipitor [Atorvastatin] Other (See Comments)    Muscle cramping   Niacin Nausea And Vomiting     Physical Exam: General: The patient is alert and oriented x3 in no acute distress.  Dermatology: Skin is warm, dry and supple bilateral lower extremities. Negative for open lesions or macerations.  Vascular: Palpable pedal pulses bilaterally. Capillary refill within normal limits.  Negative for any significant edema or erythema  Neurological: Light touch and protective threshold grossly intact  Musculoskeletal Exam:  No pedal deformities noted.  There is pain on palpation along the lateral aspect of the cuboid which correlates clinically on radiographic exam of the os peroneum  Radiographic Exam:  Normal osseous mineralization. Joint spaces preserved. No fracture/dislocation/boney destruction.  There is an os peroneum noted along the lateral aspect of the cuboid which is symptomatic clinically.  Assessment: 1.  Capsulitis left foot 2.  Peroneal tendinitis with  radiographic os peroneum left   Plan of Care:  1. Patient evaluated. X-Rays reviewed.  2.  The patient did very well last year with cortisone injection and anti-inflammatory 3.  Injection of 0.5 cc Celestone Soluspan injected into the lateral aspect of the left foot 4.  Prescription for Medrol Dosepak 5.  Prescription for meloxicam 15 mg daily 6.  Return to clinic as needed.  Explained to the patient that if this becomes recurrent frequent issue with constant daily pain despite conservative care we could discuss possible excision of the os peroneum if conservative treatment fails.      Edrick Kins, DPM [facility]  Dr. Edrick Kins, DPM    [address], [place] [address]                Office [phone]  Fax [phone]

## 2022-04-17 DIAGNOSIS — M5412 Radiculopathy, cervical region: Secondary | ICD-10-CM | POA: Diagnosis not present

## 2022-04-24 DIAGNOSIS — M5412 Radiculopathy, cervical region: Secondary | ICD-10-CM | POA: Diagnosis not present

## 2022-04-29 ENCOUNTER — Inpatient Hospital Stay: Payer: Medicare PPO | Attending: Oncology

## 2022-04-29 DIAGNOSIS — Z853 Personal history of malignant neoplasm of breast: Secondary | ICD-10-CM | POA: Insufficient documentation

## 2022-04-29 DIAGNOSIS — E538 Deficiency of other specified B group vitamins: Secondary | ICD-10-CM | POA: Insufficient documentation

## 2022-04-29 DIAGNOSIS — D509 Iron deficiency anemia, unspecified: Secondary | ICD-10-CM | POA: Insufficient documentation

## 2022-04-29 LAB — CBC WITH DIFFERENTIAL/PLATELET
Abs Immature Granulocytes: 0.04 10*3/uL (ref 0.00–0.07)
Basophils Absolute: 0.1 10*3/uL (ref 0.0–0.1)
Basophils Relative: 1 %
Eosinophils Absolute: 0.4 10*3/uL (ref 0.0–0.5)
Eosinophils Relative: 5 %
HCT: 33.1 % — ABNORMAL LOW (ref 36.0–46.0)
Hemoglobin: 10.6 g/dL — ABNORMAL LOW (ref 12.0–15.0)
Immature Granulocytes: 1 %
Lymphocytes Relative: 17 %
Lymphs Abs: 1.2 10*3/uL (ref 0.7–4.0)
MCH: 26.6 pg (ref 26.0–34.0)
MCHC: 32 g/dL (ref 30.0–36.0)
MCV: 83 fL (ref 80.0–100.0)
Monocytes Absolute: 0.4 10*3/uL (ref 0.1–1.0)
Monocytes Relative: 6 %
Neutro Abs: 5.2 10*3/uL (ref 1.7–7.7)
Neutrophils Relative %: 70 %
Platelets: 410 10*3/uL — ABNORMAL HIGH (ref 150–400)
RBC: 3.99 MIL/uL (ref 3.87–5.11)
RDW: 13.1 % (ref 11.5–15.5)
WBC: 7.2 10*3/uL (ref 4.0–10.5)
nRBC: 0 % (ref 0.0–0.2)

## 2022-04-29 LAB — IRON AND TIBC
Iron: 46 ug/dL (ref 28–170)
Saturation Ratios: 10 % — ABNORMAL LOW (ref 10.4–31.8)
TIBC: 463 ug/dL — ABNORMAL HIGH (ref 250–450)
UIBC: 417 ug/dL

## 2022-04-29 LAB — VITAMIN B12: Vitamin B-12: 764 pg/mL (ref 180–914)

## 2022-04-29 LAB — FERRITIN: Ferritin: 10 ng/mL — ABNORMAL LOW (ref 11–307)

## 2022-04-30 ENCOUNTER — Telehealth: Payer: Self-pay | Admitting: *Deleted

## 2022-04-30 NOTE — Patient Outreach (Signed)
  Care Coordination   04/30/2022 Name: Martha Hamilton MRN: [id number] DOB: 1950/12/20   Care Coordination Outreach Attempts:  An unsuccessful telephone outreach was attempted today to offer the patient information about available care coordination services as a benefit of their health plan.   Follow Up Plan:  Additional outreach attempts will be made to offer the patient care coordination information and services.   Encounter Outcome:  Pt. Refused  Care Coordination Interventions Activated:  Yes   Care Coordination Interventions:  No, not indicated    [facility] [phone]

## 2022-05-01 DIAGNOSIS — M5412 Radiculopathy, cervical region: Secondary | ICD-10-CM | POA: Diagnosis not present

## 2022-05-05 ENCOUNTER — Other Ambulatory Visit: Payer: Self-pay

## 2022-05-05 DIAGNOSIS — D508 Other iron deficiency anemias: Secondary | ICD-10-CM

## 2022-05-05 DIAGNOSIS — E538 Deficiency of other specified B group vitamins: Secondary | ICD-10-CM

## 2022-05-05 MED FILL — Iron Sucrose Inj 20 MG/ML (Fe Equiv): INTRAVENOUS | Qty: 10 | Status: AC

## 2022-05-06 ENCOUNTER — Inpatient Hospital Stay: Payer: Medicare PPO | Admitting: Oncology

## 2022-05-06 ENCOUNTER — Other Ambulatory Visit: Payer: Medicare PPO

## 2022-05-06 ENCOUNTER — Encounter: Payer: Self-pay | Admitting: Oncology

## 2022-05-06 ENCOUNTER — Inpatient Hospital Stay: Payer: Medicare PPO

## 2022-05-06 VITALS — BP 156/70 | HR 92 | Temp 98.0°F | Resp 18

## 2022-05-06 VITALS — BP 130/58 | HR 101 | Temp 98.3°F | Resp 18 | Wt 176.6 lb

## 2022-05-06 DIAGNOSIS — E538 Deficiency of other specified B group vitamins: Secondary | ICD-10-CM | POA: Diagnosis not present

## 2022-05-06 DIAGNOSIS — Z853 Personal history of malignant neoplasm of breast: Secondary | ICD-10-CM

## 2022-05-06 DIAGNOSIS — D508 Other iron deficiency anemias: Secondary | ICD-10-CM

## 2022-05-06 DIAGNOSIS — D509 Iron deficiency anemia, unspecified: Secondary | ICD-10-CM | POA: Diagnosis not present

## 2022-05-06 MED ORDER — SODIUM CHLORIDE 0.9 % IV SOLN
200.0000 mg | Freq: Once | INTRAVENOUS | Status: AC
  Administered 2022-05-06: 200 mg via INTRAVENOUS
  Filled 2022-05-06: qty 200

## 2022-05-06 MED ORDER — CYANOCOBALAMIN 1000 MCG/ML IJ SOLN
1000.0000 ug | Freq: Once | INTRAMUSCULAR | Status: AC
  Administered 2022-05-06: 1000 ug via INTRAMUSCULAR
  Filled 2022-05-06: qty 1

## 2022-05-06 MED ORDER — SODIUM CHLORIDE 0.9 % IV SOLN
Freq: Once | INTRAVENOUS | Status: AC
  Filled 2022-05-06: qty 250

## 2022-05-06 NOTE — Assessment & Plan Note (Addendum)
Remote history of right breast cancer 1996 Continue annual screening mammogram.  

## 2022-05-06 NOTE — Progress Notes (Signed)
Pt here for follow up. No new concerns voiced.   

## 2022-05-06 NOTE — Progress Notes (Signed)
Hematology/Oncology follow up note [phone] [phone]   Patient Care Team: Tower, Wynelle Fanny, MD as PCP - General Kate Sable, MD as PCP - Cardiology (Cardiology) [name], [facility] (Inactive) as Pharmacist (Pharmacist)  ASSESSMENT & PLAN:   IDA (iron deficiency anemia) Iron deficiency anemia Labs reviewed and discussed with patient. Decreased ferritin ad iron saturation.  Chronic blood loss vs malabsorption.  She has had GI work up in the past.  Recommend IV Venofer weekly x 4.   B12 deficiency Recommend to continue Vitamin B12 injection monthly. B12 level is stable.   History of breast cancer Remote history of right breast cancer Continue annual screening mammogram.   Orders Placed This Encounter  Procedures   CBC with Differential/Platelet    Standing Status:   Future    Standing Expiration Date:   05/07/2023   Comprehensive metabolic panel    Standing Status:   Future    Standing Expiration Date:   05/07/2023   Ferritin    Standing Status:   Future    Standing Expiration Date:   05/07/2023   Vitamin B12    Standing Status:   Future    Standing Expiration Date:   05/07/2023   Iron and TIBC    Standing Status:   Future    Standing Expiration Date:   05/07/2023   Follow up  4 months. Labs prior to MD visit + Venofer and B12 injections.  All questions were answered. The patient knows to call the clinic with any problems, questions or concerns.  Earlie Server, MD, PhD [facility] Hematology Oncology 05/06/2022    CHIEF COMPLAINTS/REASON FOR VISIT:  Follow-up for iron deficiency anemia  HISTORY OF PRESENTING ILLNESS:  Martha Hamilton is a  71 y.o.  female with PMH listed below who was referred to me for evaluation of anemia Reviewed patient's recent labs that was done.  08/08/20 Labs revealed anemia with hemoglobin of 9.2, MCV 93 .   Reviewed patient's previous labs ordered by primary care physician's office, anemia is chronic onset  , duration is since 2013, with baseline in 11s, worsened during the recent 6 months, after she stops taking oral iron supplementation.   Associated signs and symptoms: Patient reports fatigue. denies SOB with exertion.  Denies weight loss, easy bruising, hematochezia, hemoptysis, hematuria. Context: History of GI bleeding: deneis               History of gastric restriction surgery for GERD                Last colonoscopy: 05/01/21. Last Endoscopy 05/01/21  Remote history of right breast cancer, s/p lumpectomy with node. She denies any previous chemotherapy or any anti estrogen therapy.     Family history positive for brother with esophageal cancer, mother with RCC.  I have recommended genetic testing.  She wanted to defer and if she changes her mind she will update me..   INTERVAL HISTORY Martha Hamilton is a 71 y.o. female who has above history reviewed by me today presents for follow up visit for iron deficiency anemia Patient has been on monthly vitamin B12 injection.  Today she reports feeling well.  + fatigue. Denies melena or blood in stool.  Review of Systems  Constitutional:  Positive for fatigue. Negative for appetite change, chills and fever.  HENT:   Negative for hearing loss and voice change.   Eyes:  Negative for eye problems.  Respiratory:  Negative for chest tightness and cough.  Cardiovascular:  Negative for chest pain.  Gastrointestinal:  Negative for abdominal distention, abdominal pain and blood in stool.  Endocrine: Negative for hot flashes.  Genitourinary:  Negative for difficulty urinating and frequency.   Musculoskeletal:  Negative for arthralgias.  Skin:  Negative for itching and rash.  Neurological:  Negative for extremity weakness.  Hematological:  Negative for adenopathy.  Psychiatric/Behavioral:  Negative for confusion.      MEDICAL HISTORY:  Past Medical History:  Diagnosis Date   Allergic rhinitis    Breast cancer ([place]) 1996   right breast    DM2 (diabetes mellitus, type 2) (HCC)    GERD (gastroesophageal reflux disease)    Hx of cardiovascular stress test    a. Lex MV 2/14:  EF 69%, no ischemia   Hyperlipidemia    Hypertension    IDA (iron deficiency anemia) 08/27/2020   Iron deficiency anemia    Obesity    Osteoarthritis    hands   Ulcer     SURGICAL HISTORY: Past Surgical History:  Procedure Laterality Date   APPENDECTOMY  1976   BREAST LUMPECTOMY Right 1996   right breast, lumpectomy w/nodes   CATARACT EXTRACTION W/PHACO Left 11/28/2020   Procedure: CATARACT EXTRACTION PHACO AND INTRAOCULAR LENS PLACEMENT ([place]) LEFT DIABETIC;  Surgeon: Leandrew Koyanagi, MD;  Location: [place];  Service: Ophthalmology;  Laterality: Left;  5.02 01:07.4 7.4%   CATARACT EXTRACTION W/PHACO Right 12/12/2020   Procedure: CATARACT EXTRACTION PHACO AND INTRAOCULAR LENS PLACEMENT ([facility]) RIGHT DIABETIC 5.22 00:59.2;  Surgeon: Leandrew Koyanagi, MD;  Location: [place];  Service: Ophthalmology;  Laterality: Right;   CHONDROPLASTY Left 02/10/2018   Procedure: CHONDROPLASTY;  Surgeon: Lovell Sheehan, MD;  Location: [facility];  Service: Orthopedics;  Laterality: Left;   GASTRIC RESTRICTION SURGERY     for reflux   HYSTEROSCOPY WITH D & C N/A 10/18/2012   Procedure: DILATATION AND CURETTAGE /HYSTEROSCOPY;  Surgeon: Mora Bellman, MD;  Location: [facility];  Service: Gynecology;  Laterality: N/A;   KNEE ARTHROSCOPY WITH MEDIAL MENISECTOMY Left 02/10/2018   Procedure: KNEE ARTHROSCOPY WITH MEDIAL and LATERAL MENISECTOMY;  Surgeon: Lovell Sheehan, MD;  Location: [facility];  Service: Orthopedics;  Laterality: Left;   POLYPECTOMY N/A 10/18/2012   Procedure: POLYPECTOMY;  Surgeon: Mora Bellman, MD;  Location: [facility];  Service: Gynecology;  Laterality: N/A;   SYNOVECTOMY Left 02/10/2018   Procedure: SYNOVECTOMY;  Surgeon: Lovell Sheehan, MD;  Location: [facility];  Service: Orthopedics;  Laterality: Left;    SOCIAL HISTORY: Social  History   Socioeconomic History   Marital status: Single    Spouse name: Not on file   Number of children: 0   Years of education: Not on file   Highest education level: Not on file  Occupational History   Occupation: retired    Fish farm manager: RETIRED  Tobacco Use   Smoking status: Never   Smokeless tobacco: Never  Vaping Use   Vaping Use: Never used  Substance and Sexual Activity   Alcohol use: Never    Alcohol/week: 0.0 standard drinks of alcohol   Drug use: No   Sexual activity: Not Currently    Birth control/protection: Post-menopausal  Other Topics Concern   Not on file  Social History Narrative   Lives with, and cares for her elderly mother.   Social Determinants of Health   Financial Resource Strain: Low Risk  (01/20/2022)   Overall Financial Resource Strain (CARDIA)    Difficulty of Paying Living Expenses: Not hard at  all  Food Insecurity: No Food Insecurity (01/20/2022)   Hunger Vital Sign    Worried About Running Out of Food in the Last Year: Never true    Ran Out of Food in the Last Year: Never true  Transportation Needs: No Transportation Needs (01/20/2022)   PRAPARE - Hydrologist (Medical): No    Lack of Transportation (Non-Medical): No  Physical Activity: Insufficiently Active (01/20/2022)   Exercise Vital Sign    Days of Exercise per Week: 2 days    Minutes of Exercise per Session: 20 min  Stress: No Stress Concern Present (01/20/2022)   [place]    Feeling of Stress : Not at all  Social Connections: Moderately Isolated (01/20/2022)   Social Connection and Isolation Panel [NHANES]    Frequency of Communication with Friends and Family: More than three times a week    Frequency of Social Gatherings with Friends and Family: More than three times a week    Attends Religious Services: More than 4 times per year    Active Member of [employer] or Organizations: No    Attends  Archivist Meetings: Never    Marital Status: Never married  Intimate Partner Violence: Not At Risk (01/20/2022)   Humiliation, Afraid, Rape, and Kick questionnaire    Fear of Current or Ex-Partner: No    Emotionally Abused: No    Physically Abused: No    Sexually Abused: No    FAMILY HISTORY: Family History  Problem Relation Age of Onset   Diabetes Mother    Heart disease Mother        Pacemaker, CHF   Hypertension Mother    Kidney cancer Mother    Stroke Father    CAD Father 47       Died with MI   Esophageal cancer Brother    Heart disease Sister    Colon cancer Neg Hx    Rectal cancer Neg Hx    Stomach cancer Neg Hx    Pancreatic cancer Neg Hx     ALLERGIES:  is allergic to cholestyramine, simvastatin, lipitor [atorvastatin], and niacin.  MEDICATIONS:  Current Outpatient Medications  Medication Sig Dispense Refill   ACCU-CHEK AVIVA PLUS test strip TEST BLOOD SUGAR EVERY DAY FOR DIABETES 100 strip 1   Accu-Chek Softclix Lancets lancets TEST BLOOD SUGAR EVERY DAY FOR DIABETES 100 each 1   acetaminophen (TYLENOL) 500 MG tablet Take 1,000 mg by mouth every 6 (six) hours as needed for mild pain.      Alcohol Swabs (DROPSAFE ALCOHOL PREP) 70 % PADS USE ONE TIME DAILY AS DIRECTED  WHEN  CHECKING BLOOD SUGAR 100 each 1   amitriptyline (ELAVIL) 25 MG tablet Take 1 tablet (25 mg total) by mouth at bedtime. 90 tablet 3   aspirin 81 MG tablet Take 81 mg by mouth at bedtime.      Blood Glucose Calibration (ACCU-CHEK AVIVA) SOLN USE TO CALIBRATE METER AS DIRECTED 1 each 2   Blood Glucose Monitoring Suppl (ACCU-CHEK AVIVA PLUS) w/Device KIT Use to check blood sugar once daily for DM (dx. E11.9) 1 kit 0   calcium elemental as carbonate (BARIATRIC TUMS ULTRA) 400 MG chewable tablet Chew 1,000 mg by mouth daily as needed for heartburn.     Camphor-Menthol-Methyl Sal (SALONPAS) 3.07-19-08 % PTCH Apply topically.     Cholecalciferol (VITAMIN D) 2000 units tablet Take 2,000 Units by  mouth daily.  Cyanocobalamin (VITAMIN B-12) 1000 MCG SUBL Place 1 tablet (1,000 mcg total) under the tongue daily. 90 tablet 1   Docusate Sodium (DSS) 100 MG CAPS Take 1 capsule by mouth as needed.     ezetimibe (ZETIA) 10 MG tablet Take 1 tablet (10 mg total) by mouth daily. 30 tablet 3   fenofibrate (TRICOR) 145 MG tablet TAKE 1 TABLET EVERY DAY 90 tablet 1   gabapentin (NEURONTIN) 300 MG capsule Take 300 mg by mouth 3 (three) times daily as needed.     glipiZIDE (GLUCOTROL XL) 5 MG 24 hr tablet Take 1 tablet (5 mg total) by mouth daily with breakfast. 90 tablet 3   loratadine (CLARITIN) 10 MG tablet Take 10 mg by mouth daily as needed for allergies.     losartan (COZAAR) 50 MG tablet Take 1 tablet (50 mg total) by mouth daily. 90 tablet 3   meclizine (ANTIVERT) 25 MG tablet Take 1 tablet (25 mg total) by mouth 3 (three) times daily as needed for dizziness. 90 tablet 3   meloxicam (MOBIC) 15 MG tablet Take 1 tablet (15 mg total) by mouth daily. 30 tablet 1   metFORMIN (GLUCOPHAGE) 1000 MG tablet Take 0.5 tablets (500 mg total) by mouth 2 (two) times daily with a meal. 90 tablet 3   metoprolol succinate (TOPROL-XL) 50 MG 24 hr tablet Take 1 tablet (50 mg total) by mouth daily. Take with or immediately following a meal. 90 tablet 3   omeprazole (PRILOSEC) 40 MG capsule TAKE 1 CAPSULE IN THE MORNING AND AT BEDTIME. 180 capsule 3   ondansetron (ZOFRAN) 4 MG tablet TAKE 1 TABLET EVERY 4 HOURS AS NEEDED FOR NAUSEA AND VOMITING 90 tablet 0   SALINE NASAL SPRAY NA Place 1 spray into the nose daily as needed (congestion).      No current facility-administered medications for this visit.     PHYSICAL EXAMINATION: ECOG PERFORMANCE STATUS: 1 - Symptomatic but completely ambulatory Vitals:   05/06/22 1336  BP: (!) 130/58  Pulse: (!) 101  Resp: 18  Temp: 98.3 F (36.8 C)   Filed Weights   05/06/22 1336  Weight: 176 lb 9.6 oz (80.1 kg)    Physical Exam Constitutional:      General: She is  not in acute distress. HENT:     Head: Normocephalic and atraumatic.  Eyes:     General: No scleral icterus. Cardiovascular:     Rate and Rhythm: Normal rate and regular rhythm.     Heart sounds: Normal heart sounds.  Pulmonary:     Effort: Pulmonary effort is normal. No respiratory distress.     Breath sounds: No wheezing.  Abdominal:     General: Bowel sounds are normal. There is no distension.     Palpations: Abdomen is soft.  Musculoskeletal:        General: No deformity. Normal range of motion.     Cervical back: Normal range of motion and neck supple.  Skin:    General: Skin is warm and dry.     Findings: No erythema or rash.  Neurological:     Mental Status: She is alert and oriented to person, place, and time. Mental status is at baseline.     Cranial Nerves: No cranial nerve deficit.     Coordination: Coordination normal.  Psychiatric:        Mood and Affect: Mood normal.      LABORATORY DATA:  I have reviewed the data as listed    Latest  Ref Rng & Units 04/29/2022   10:54 AM 02/28/2022   10:17 AM 12/01/2021    5:33 PM  CBC  WBC 4.0 - 10.5 K/uL 7.2  7.2  8.3   Hemoglobin 12.0 - 15.0 g/dL 10.6  11.7  12.7   Hematocrit 36.0 - 46.0 % 33.1  36.6  38.6   Platelets 150 - 400 K/uL 410  412.0  435       Latest Ref Rng & Units 03/20/2022    9:30 AM 02/28/2022   10:17 AM 12/11/2021    7:54 AM  CMP  Glucose 70 - 99 mg/dL 125  147  132   BUN 6 - 23 mg/dL 31  20  23    Creatinine 0.40 - 1.20 mg/dL 1.07  1.10  1.01   Sodium 135 - 145 mEq/L 137  137  139   Potassium 3.5 - 5.1 mEq/L 4.2  4.3  4.2   Chloride 96 - 112 mEq/L 101  102  103   CO2 19 - 32 mEq/L 24  26  24    Calcium 8.4 - 10.5 mg/dL 9.4  9.6  9.4   Total Protein 6.0 - 8.3 g/dL  7.5    Total Bilirubin 0.2 - 1.2 mg/dL  0.2    Alkaline Phos 39 - 117 U/L  40    AST 0 - 37 U/L  17    ALT 0 - 35 U/L  15       Iron/TIBC/Ferritin/ %Sat    Component Value Date/Time   IRON 46 04/29/2022 1054   TIBC 463 (H)  04/29/2022 1054   FERRITIN 10 (L) 04/29/2022 1054   IRONPCTSAT 10 (L) 04/29/2022 1054

## 2022-05-06 NOTE — Assessment & Plan Note (Signed)
Recommend to continue Vitamin B12 injection monthly. B12 level is stable.

## 2022-05-06 NOTE — Assessment & Plan Note (Signed)
Iron deficiency anemia Labs reviewed and discussed with patient. Decreased ferritin ad iron saturation.  Chronic blood loss vs malabsorption.  She has had GI work up in the past.  Recommend IV Venofer weekly x 4.

## 2022-05-08 ENCOUNTER — Inpatient Hospital Stay: Payer: Medicare PPO

## 2022-05-08 DIAGNOSIS — M5412 Radiculopathy, cervical region: Secondary | ICD-10-CM | POA: Diagnosis not present

## 2022-05-12 ENCOUNTER — Ambulatory Visit: Payer: Medicare PPO

## 2022-05-13 ENCOUNTER — Inpatient Hospital Stay: Payer: Medicare PPO

## 2022-05-14 ENCOUNTER — Inpatient Hospital Stay: Payer: Medicare PPO | Attending: Internal Medicine

## 2022-05-14 VITALS — BP 165/77 | HR 89 | Temp 97.8°F | Resp 18

## 2022-05-14 DIAGNOSIS — E538 Deficiency of other specified B group vitamins: Secondary | ICD-10-CM | POA: Diagnosis not present

## 2022-05-14 DIAGNOSIS — D508 Other iron deficiency anemias: Secondary | ICD-10-CM | POA: Diagnosis not present

## 2022-05-14 MED ORDER — SODIUM CHLORIDE 0.9 % IV SOLN
Freq: Once | INTRAVENOUS | Status: AC
  Filled 2022-05-14: qty 250

## 2022-05-14 MED ORDER — SODIUM CHLORIDE 0.9 % IV SOLN
200.0000 mg | Freq: Once | INTRAVENOUS | Status: AC
  Administered 2022-05-14: 200 mg via INTRAVENOUS
  Filled 2022-05-14: qty 200

## 2022-05-17 ENCOUNTER — Other Ambulatory Visit: Payer: Self-pay | Admitting: Oncology

## 2022-05-20 ENCOUNTER — Inpatient Hospital Stay: Payer: Medicare PPO

## 2022-05-21 ENCOUNTER — Inpatient Hospital Stay: Payer: Medicare PPO

## 2022-05-21 VITALS — BP 140/67 | HR 87 | Temp 98.7°F | Resp 18

## 2022-05-21 DIAGNOSIS — D508 Other iron deficiency anemias: Secondary | ICD-10-CM

## 2022-05-21 DIAGNOSIS — E538 Deficiency of other specified B group vitamins: Secondary | ICD-10-CM | POA: Diagnosis not present

## 2022-05-21 MED ORDER — SODIUM CHLORIDE 0.9 % IV SOLN
200.0000 mg | Freq: Once | INTRAVENOUS | Status: AC
  Administered 2022-05-21: 200 mg via INTRAVENOUS
  Filled 2022-05-21: qty 200

## 2022-05-21 MED ORDER — SODIUM CHLORIDE 0.9 % IV SOLN
INTRAVENOUS | Status: DC
  Filled 2022-05-21: qty 250

## 2022-05-27 ENCOUNTER — Inpatient Hospital Stay: Payer: Medicare PPO

## 2022-05-28 ENCOUNTER — Inpatient Hospital Stay: Payer: Medicare PPO

## 2022-05-28 VITALS — BP 149/70 | HR 91 | Temp 97.9°F | Resp 16

## 2022-05-28 DIAGNOSIS — D508 Other iron deficiency anemias: Secondary | ICD-10-CM | POA: Diagnosis not present

## 2022-05-28 DIAGNOSIS — E538 Deficiency of other specified B group vitamins: Secondary | ICD-10-CM | POA: Diagnosis not present

## 2022-05-28 MED ORDER — SODIUM CHLORIDE 0.9 % IV SOLN
Freq: Once | INTRAVENOUS | Status: AC
  Filled 2022-05-28: qty 250

## 2022-05-28 MED ORDER — SODIUM CHLORIDE 0.9 % IV SOLN
200.0000 mg | Freq: Once | INTRAVENOUS | Status: AC
  Administered 2022-05-28: 200 mg via INTRAVENOUS
  Filled 2022-05-28: qty 200

## 2022-05-29 ENCOUNTER — Other Ambulatory Visit
Admission: RE | Admit: 2022-05-29 | Discharge: 2022-05-29 | Disposition: A | Discharge status: Home / Self Care | Payer: Medicare PPO | Attending: Cardiology | Admitting: Cardiology

## 2022-05-29 DIAGNOSIS — E781 Pure hyperglyceridemia: Secondary | ICD-10-CM | POA: Insufficient documentation

## 2022-05-29 LAB — LIPID PANEL
Cholesterol: 140 mg/dL (ref 0–200)
HDL: 31 mg/dL — ABNORMAL LOW (ref >40)
LDL Cholesterol: 31 mg/dL (ref 0–99)
Total CHOL/HDL Ratio: 4.5 RATIO
Triglycerides: 389 mg/dL — ABNORMAL HIGH (ref <150)
VLDL: 78 mg/dL — ABNORMAL HIGH (ref 0–40)

## 2022-06-02 ENCOUNTER — Telehealth: Payer: Self-pay

## 2022-06-02 DIAGNOSIS — E781 Pure hyperglyceridemia: Secondary | ICD-10-CM

## 2022-06-02 MED ORDER — BEMPEDOIC ACID 180 MG PO TABS: 1.0000 | ORAL_TABLET | Freq: Every day | ORAL | 5 refills | Status: DC

## 2022-06-02 NOTE — Telephone Encounter (Signed)
-----   Message from Kate Sable, MD sent at 05/29/2022  3:55 PM EST ----- Triglycerides still elevated, add bempedoic acid 1 tablet daily.  Repeat lipid panel in 2-3 months prior to follow-up visit.  Will consider Repatha at follow-up visit.

## 2022-06-02 NOTE — Telephone Encounter (Signed)
The patient has been notified of the result and verbalized understanding.  All questions (if any) were answered. Kavin Leech, RN 06/02/2022 4:06 PM

## 2022-06-03 ENCOUNTER — Other Ambulatory Visit: Payer: Self-pay

## 2022-06-03 NOTE — Telephone Encounter (Signed)
Prior authorization initiated in [email or web address] for Nexletol 180 mg tablets.   KEY: [id number]  Response: Your information has been submitted to [facility]. [employer] will review the request and will issue a decision, typically within 3-7 days from your submission. If [employer] has not responded in 3-7 days or if you have any questions about your ePA request, please contact [employer] at [phone].

## 2022-06-04 ENCOUNTER — Telehealth: Payer: Self-pay | Admitting: Cardiology

## 2022-06-04 ENCOUNTER — Telehealth: Payer: Self-pay

## 2022-06-04 NOTE — Telephone Encounter (Signed)
Called and spoke with patient and she stated that her pharmacy had not  received the Bempodoic Acid. After further review I saw that the PA was just approved today. Informed patient that she can call her pharmacy and have it filled now. Patient was grateful for the call back.

## 2022-06-04 NOTE — Telephone Encounter (Signed)
Prior Authorization for Nexletol 180 mg tablets approved through 07/14/22.

## 2022-06-04 NOTE — Telephone Encounter (Signed)
Pt c/o medication issue:  1. Name of Medication:   Bempedoic Acid 180 MG TABS    2. How are you currently taking this medication (dosage and times per day)? Take 81 mg by mouth at bedtime.   3. Are you having a reaction (difficulty breathing--STAT)? no  4. What is your medication issue? Needs new prescription sent to [place], [place]

## 2022-06-09 ENCOUNTER — Inpatient Hospital Stay: Payer: Medicare PPO

## 2022-06-09 DIAGNOSIS — D508 Other iron deficiency anemias: Secondary | ICD-10-CM

## 2022-06-09 DIAGNOSIS — E538 Deficiency of other specified B group vitamins: Secondary | ICD-10-CM | POA: Diagnosis not present

## 2022-06-09 MED ORDER — CYANOCOBALAMIN 1000 MCG/ML IJ SOLN
1000.0000 ug | Freq: Once | INTRAMUSCULAR | Status: AC
  Administered 2022-06-09: 1000 ug via INTRAMUSCULAR
  Filled 2022-06-09: qty 1

## 2022-06-16 ENCOUNTER — Other Ambulatory Visit: Payer: Self-pay

## 2022-06-16 MED ORDER — EZETIMIBE 10 MG PO TABS: 10.0000 mg | ORAL_TABLET | Freq: Every day | ORAL | 3 refills | Status: DC

## 2022-06-18 ENCOUNTER — Ambulatory Visit: Payer: Medicare PPO | Admitting: Podiatry

## 2022-06-19 IMAGING — MG MM DIGITAL SCREENING BILAT W/ TOMO AND CAD
6 of 10 series · 6 of 30 positions shown · non-contrast
Comparison: Previous exam(s).

CLINICAL DATA: Screening.

EXAM:
DIGITAL SCREENING BILATERAL MAMMOGRAM WITH TOMOSYNTHESIS AND CAD
TECHNIQUE: Bilateral screening digital craniocaudal and mediolateral oblique
mammograms were obtained. Bilateral screening digital breast
tomosynthesis was performed. The images were evaluated with
computer-aided detection.

[R CC synth-2D]
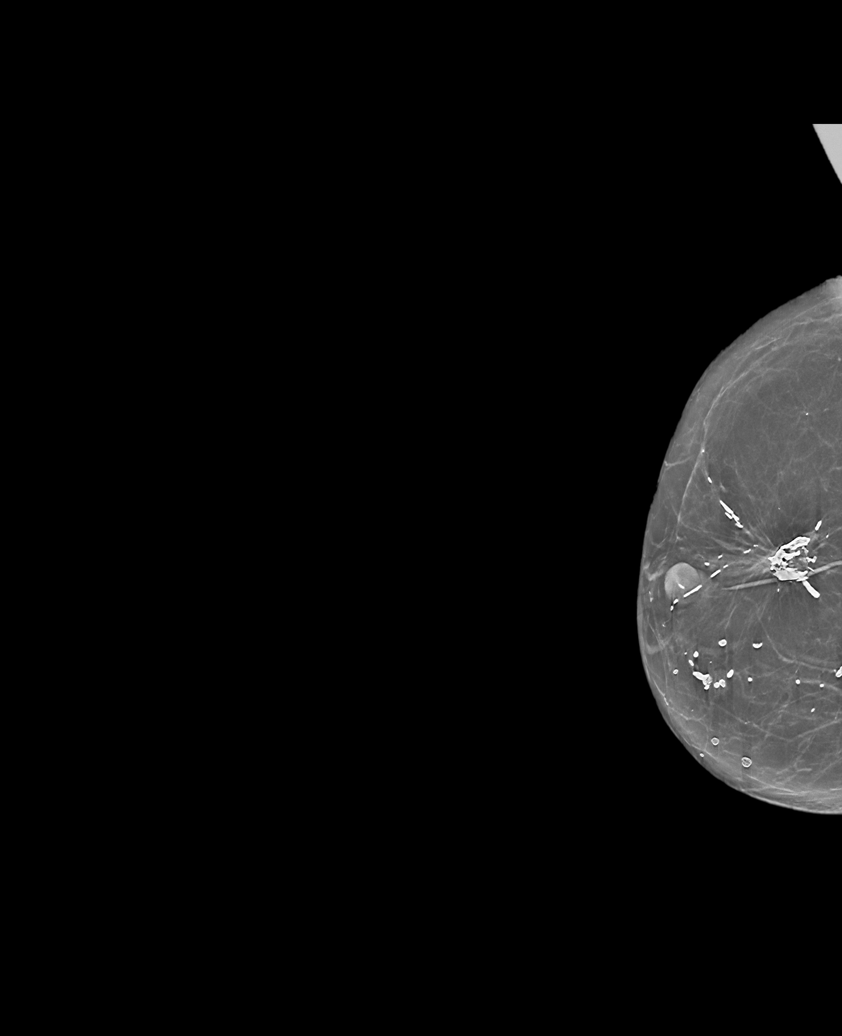

[L MLO synth-2D]
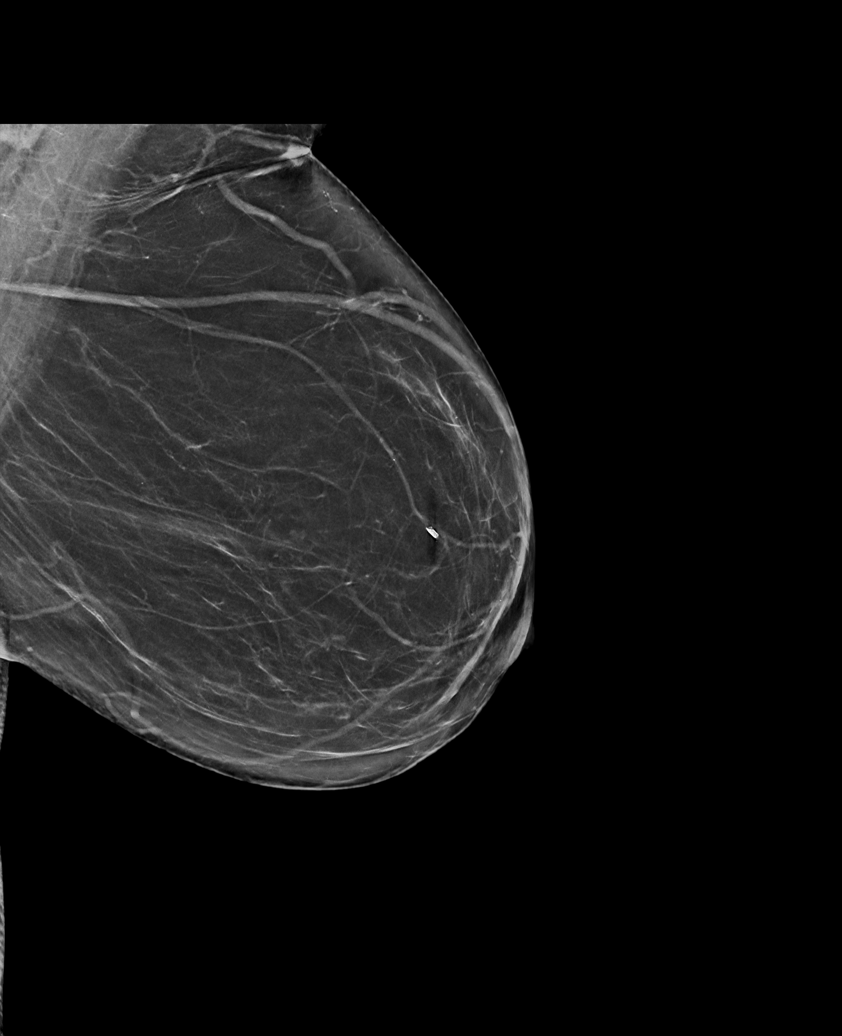

[R MLO synth-2D (1 of 2)]
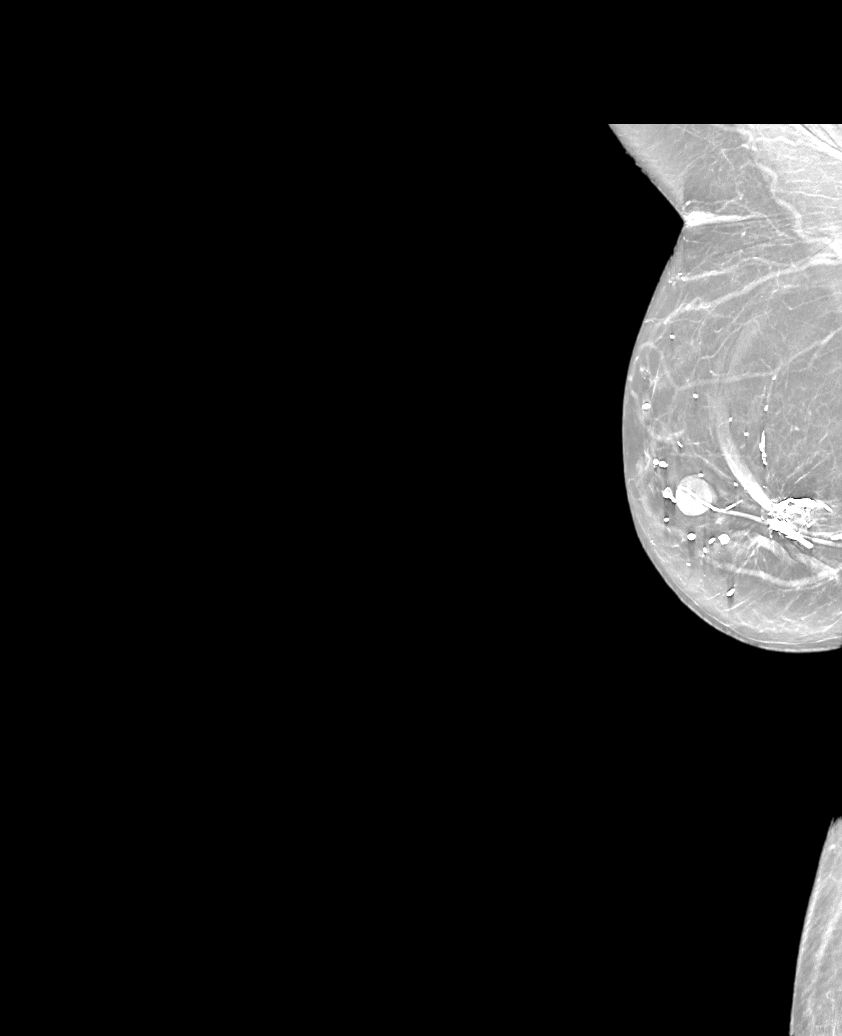

[R MLO synth-2D (2 of 2)]
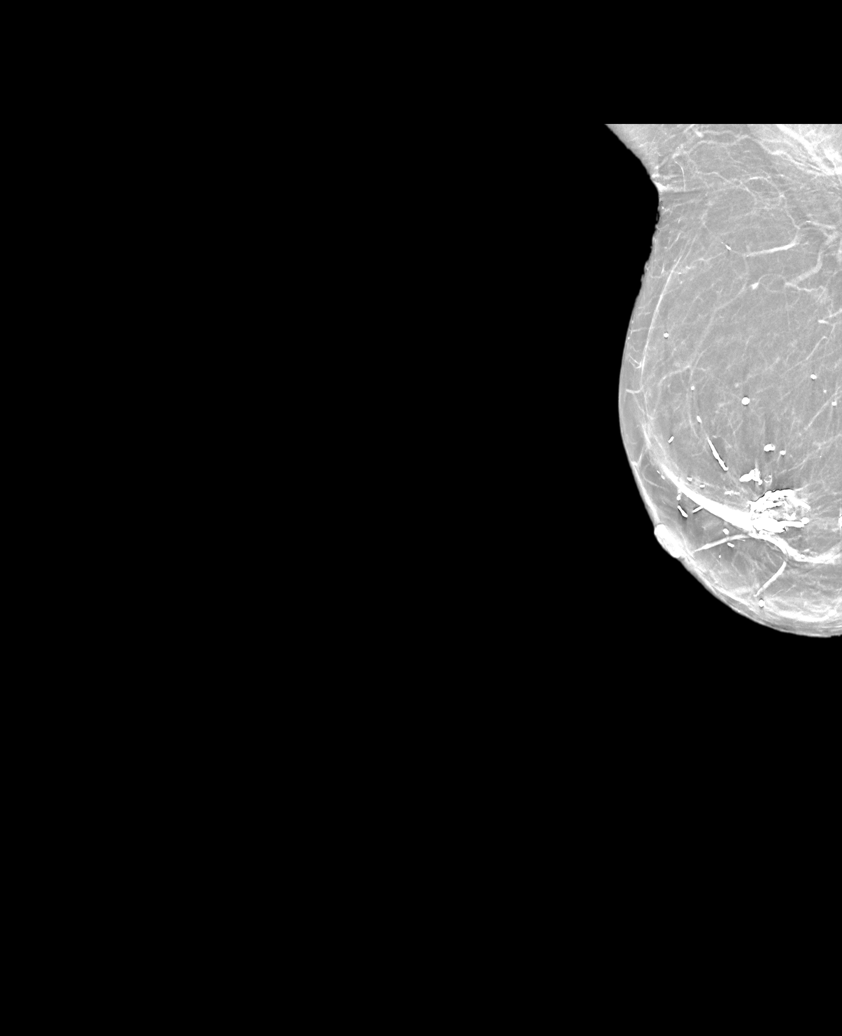

[L CC synth-2D]
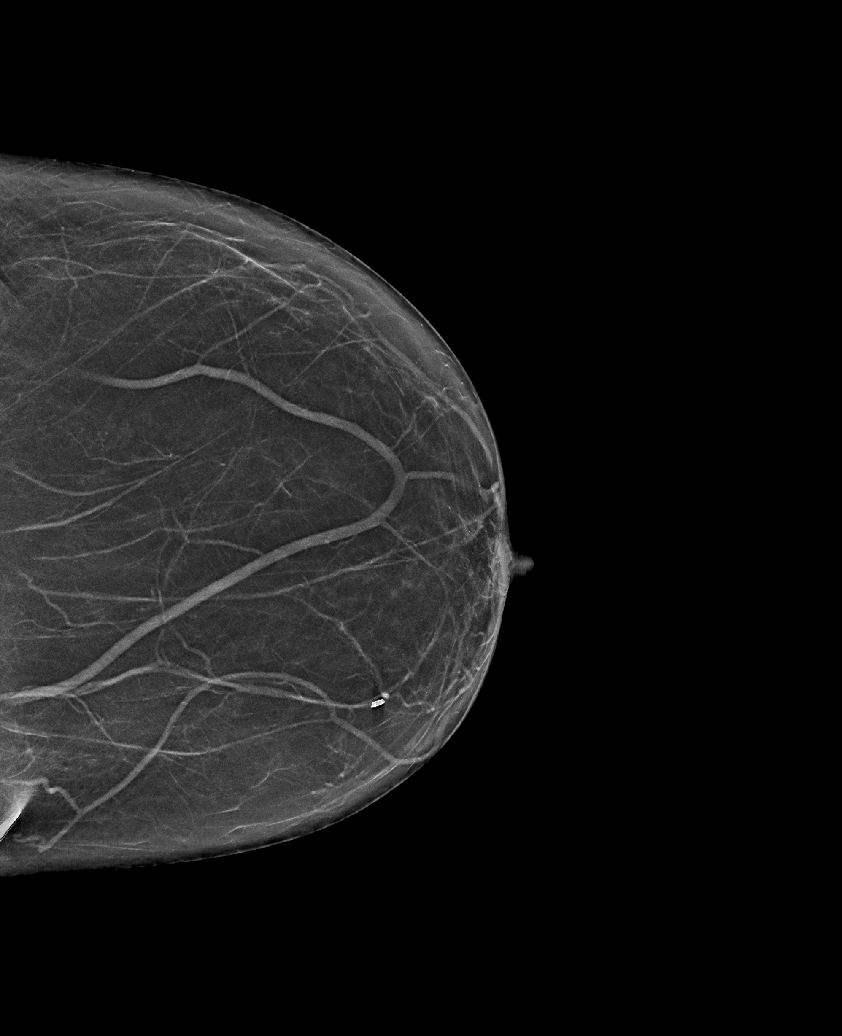

[R MLO tomo · tomo slice 26/51.0]
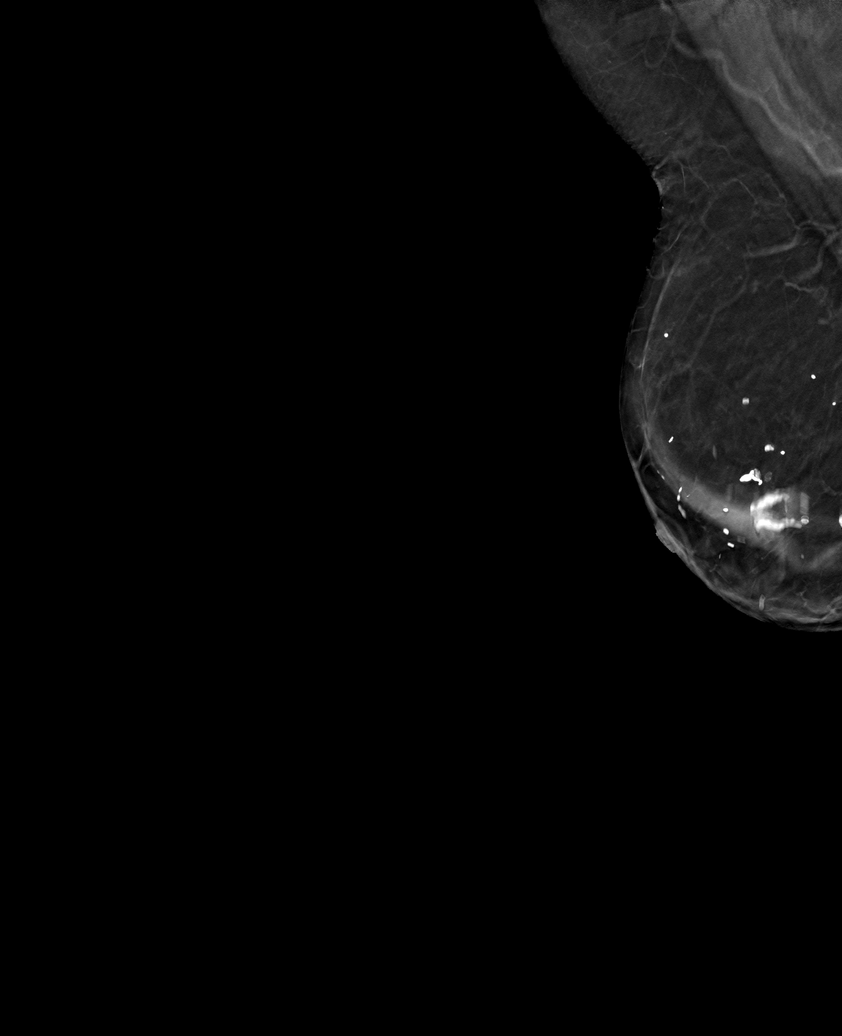

[6 of 30 positions shown; findings below may reference images not displayed]

ACR Breast Density Category b: There are scattered areas of
fibroglandular density.
FINDINGS: There are no findings suspicious for malignancy.
IMPRESSION: No mammographic evidence of malignancy. A result letter of this
screening mammogram will be mailed directly to the patient.

RECOMMENDATION:
Screening mammogram in one year. (Code:51-O-LD2)

BI-RADS CATEGORY  1: Negative.

## 2022-06-23 ENCOUNTER — Ambulatory Visit: Payer: Medicare PPO | Admitting: Podiatry

## 2022-06-25 ENCOUNTER — Other Ambulatory Visit: Payer: Self-pay | Admitting: Family Medicine

## 2022-06-25 DIAGNOSIS — E119 Type 2 diabetes mellitus without complications: Secondary | ICD-10-CM

## 2022-06-30 ENCOUNTER — Other Ambulatory Visit: Payer: Self-pay | Admitting: Podiatry

## 2022-07-01 ENCOUNTER — Ambulatory Visit: Payer: Medicare PPO | Admitting: Podiatry

## 2022-07-04 ENCOUNTER — Ambulatory Visit
Admission: RE | Admit: 2022-07-04 | Discharge: 2022-07-04 | Disposition: A | Discharge status: Home / Self Care | Payer: Medicare PPO | Source: Ambulatory Visit | Attending: Family Medicine | Admitting: Family Medicine

## 2022-07-04 DIAGNOSIS — Z1231 Encounter for screening mammogram for malignant neoplasm of breast: Secondary | ICD-10-CM

## 2022-07-09 ENCOUNTER — Inpatient Hospital Stay: Payer: Medicare PPO | Attending: Internal Medicine

## 2022-07-09 DIAGNOSIS — E538 Deficiency of other specified B group vitamins: Secondary | ICD-10-CM | POA: Insufficient documentation

## 2022-07-09 DIAGNOSIS — D508 Other iron deficiency anemias: Secondary | ICD-10-CM

## 2022-07-09 MED ORDER — CYANOCOBALAMIN 1000 MCG/ML IJ SOLN
1000.0000 ug | Freq: Once | INTRAMUSCULAR | Status: AC
  Administered 2022-07-09: 1000 ug via INTRAMUSCULAR
  Filled 2022-07-09: qty 1

## 2022-07-14 HISTORY — PX: FOOT SURGERY: SHX648

## 2022-07-18 DIAGNOSIS — M1712 Unilateral primary osteoarthritis, left knee: Secondary | ICD-10-CM | POA: Diagnosis not present

## 2022-07-22 ENCOUNTER — Ambulatory Visit: Payer: Medicare PPO | Admitting: Podiatry

## 2022-07-22 VITALS — BP 148/89

## 2022-07-22 DIAGNOSIS — M7752 Other enthesopathy of left foot: Secondary | ICD-10-CM | POA: Diagnosis not present

## 2022-07-22 DIAGNOSIS — M778 Other enthesopathies, not elsewhere classified: Secondary | ICD-10-CM

## 2022-07-22 MED ORDER — BETAMETHASONE SOD PHOS & ACET 6 (3-3) MG/ML IJ SUSP
3.0000 mg | Freq: Once | INTRAMUSCULAR | Status: AC
  Administered 2022-07-22: 3 mg via INTRA_ARTICULAR

## 2022-07-22 NOTE — Progress Notes (Signed)
Chief Complaint  Patient presents with   Foot Pain    Left foot pain, patient would like an injection today, TX injection, dosepak,     HPI: 72 y.o. female presenting today for recurrence of pain and tenderness to the lateral aspect of the left foot.  Patient continues to have pain and tenderness to the lateral aspect of the left foot.  She says the cortisone only helped temporarily.  She continues have pain and tenderness.  Presenting today for further treatment and evaluation  Past Medical History:  Diagnosis Date   Allergic rhinitis    Breast cancer ([place]) 1996   right breast   DM2 (diabetes mellitus, type 2) ([place])    GERD (gastroesophageal reflux disease)    Hx of cardiovascular stress test    a. Lex MV 2/14:  EF 69%, no ischemia   Hyperlipidemia    Hypertension    IDA (iron deficiency anemia) 08/27/2020   Iron deficiency anemia    Obesity    Osteoarthritis    hands   Ulcer     Past Surgical History:  Procedure Laterality Date   APPENDECTOMY  1976   BREAST LUMPECTOMY Right 1996   right breast, lumpectomy w/nodes   CATARACT EXTRACTION W/PHACO Left 11/28/2020   Procedure: CATARACT EXTRACTION PHACO AND INTRAOCULAR LENS PLACEMENT ([place]) LEFT DIABETIC;  Surgeon: Leandrew Koyanagi, MD;  Location: [place];  Service: Ophthalmology;  Laterality: Left;  5.02 01:07.4 7.4%   CATARACT EXTRACTION W/PHACO Right 12/12/2020   Procedure: CATARACT EXTRACTION PHACO AND INTRAOCULAR LENS PLACEMENT ([facility]) RIGHT DIABETIC 5.22 00:59.2;  Surgeon: Leandrew Koyanagi, MD;  Location: [facility];  Service: Ophthalmology;  Laterality: Right;   CHONDROPLASTY Left 02/10/2018   Procedure: CHONDROPLASTY;  Surgeon: Lovell Sheehan, MD;  Location: [facility];  Service: Orthopedics;  Laterality: Left;   GASTRIC RESTRICTION SURGERY     for reflux   HYSTEROSCOPY WITH D & C N/A 10/18/2012   Procedure: DILATATION AND CURETTAGE /HYSTEROSCOPY;  Surgeon: Mora Bellman, MD;  Location: [facility];   Service: Gynecology;  Laterality: N/A;   KNEE ARTHROSCOPY WITH MEDIAL MENISECTOMY Left 02/10/2018   Procedure: KNEE ARTHROSCOPY WITH MEDIAL and LATERAL MENISECTOMY;  Surgeon: Lovell Sheehan, MD;  Location: [facility];  Service: Orthopedics;  Laterality: Left;   POLYPECTOMY N/A 10/18/2012   Procedure: POLYPECTOMY;  Surgeon: Mora Bellman, MD;  Location: [facility];  Service: Gynecology;  Laterality: N/A;   SYNOVECTOMY Left 02/10/2018   Procedure: SYNOVECTOMY;  Surgeon: Lovell Sheehan, MD;  Location: [facility];  Service: Orthopedics;  Laterality: Left;    Allergies  Allergen Reactions   Cholestyramine Other (See Comments)    REACTION: Muscles tightened up, drew up.   Simvastatin Other (See Comments)    Muscular pain   Lipitor [Atorvastatin] Other (See Comments)    Muscle cramping   Niacin Nausea And Vomiting     Physical Exam: General: The patient is alert and oriented x3 in no acute distress.  Dermatology: Skin is warm, dry and supple bilateral lower extremities. Negative for open lesions or macerations.  Vascular: Palpable pedal pulses bilaterally. Capillary refill within normal limits.  Negative for any significant edema or erythema  Neurological: Light touch and protective threshold grossly intact  Musculoskeletal Exam: No pedal deformities noted.  There is pain on palpation along the lateral aspect of the cuboid which correlates clinically on radiographic exam of the os peroneum  Radiographic Exam LT foot 04/15/2022:  Normal osseous mineralization. Joint spaces preserved. No fracture/dislocation/boney destruction.  There is an os peroneum noted along the lateral aspect of the cuboid which is symptomatic clinically.  Assessment: 1.  Capsulitis left foot 2.  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 Patient evaluated. X-Rays reviewed.  2.  Unfortunately the patient continues to have recurrence of pain and tenderness.  Pain is directly associated to  the os peroneum.  Discussed excision of the os peroneum as well as evaluation of the peroneal tendon and the patient agrees and would like to proceed with surgical excision of the os peroneum and evaluation/repair of the peroneal tendon.  Risk benefits advantages and disadvantages as well as the postoperative recovery course were explained in detail.  No guarantees were expressed or implied.  All patient questions answered. 3.  In the meantime, injection of 0.5 cc Celestone Soluspan injected into the lateral aspect of the left foot 4.  Continue meloxicam 15 mg daily 5.  Prior to surgery I would like to have a CT of the foot.  CT order placed today. 6.  Authorization for surgery was initiated today.  Surgery will consist of excision of os peroneum left.  Repair of peroneal tendon left.   7.  Return to clinic 1 week postop     Edrick Kins, DPM [facility]  Dr. Edrick Kins, DPM    [address], [place] [address]                Office [phone]  Fax [phone]

## 2022-07-24 ENCOUNTER — Telehealth: Payer: Self-pay | Admitting: Podiatry

## 2022-07-24 NOTE — Telephone Encounter (Signed)
DOS: 08/07/2022  [employer] Medicare Effective 07/14/2021  Tarsal Exostectomy Lt [phone]) Repair Flexor Tendon Foot Lt (83662)  Deductible: $0 Out-of-Pocket: $4,000 with $3,960 remaining CoInsurance: 0%  The following codes do not require a pre-authorization All services are subject to members benefits, exclusions, limitations and other applicable conditions. Created on 07/24/2022  Service info [phone] Excision or curettage of bone cyst or benign tumor, tarsal or metatarsal, except talus or calcaneus; 28200 Repair, tendon, flexor, foot; primary or secondary, without free graft, each tendon This document contains confidential information and is protected by the [employer] and Accountability Act (HIPAA), the [place] for [employer] and [place] (HITECH) and a number of other federal and state privacy laws. This document and its contents may only be accessed, used or disclosed by duly authorized individuals in the course of the subject's treatment, claims processing or as otherwise required or permitted by law. Any other access, use or disclosure is strictly prohibited and may result in civil or criminal penalties

## 2022-07-25 ENCOUNTER — Ambulatory Visit
Admission: RE | Admit: 2022-07-25 | Discharge: 2022-07-25 | Disposition: A | Discharge status: Home / Self Care | Payer: Medicare PPO | Source: Ambulatory Visit | Attending: Podiatry | Admitting: Podiatry

## 2022-07-25 DIAGNOSIS — M7989 Other specified soft tissue disorders: Secondary | ICD-10-CM | POA: Diagnosis not present

## 2022-07-25 DIAGNOSIS — M7752 Other enthesopathy of left foot: Secondary | ICD-10-CM

## 2022-08-07 ENCOUNTER — Other Ambulatory Visit: Payer: Self-pay | Admitting: Podiatry

## 2022-08-07 DIAGNOSIS — M7672 Peroneal tendinitis, left leg: Secondary | ICD-10-CM | POA: Diagnosis not present

## 2022-08-07 DIAGNOSIS — G8918 Other acute postprocedural pain: Secondary | ICD-10-CM | POA: Diagnosis not present

## 2022-08-07 DIAGNOSIS — M7752 Other enthesopathy of left foot: Secondary | ICD-10-CM | POA: Diagnosis not present

## 2022-08-07 DIAGNOSIS — M898X7 Other specified disorders of bone, ankle and foot: Secondary | ICD-10-CM | POA: Diagnosis not present

## 2022-08-07 MED ORDER — MELOXICAM 15 MG PO TABS: ORAL_TABLET | ORAL | 1 refills | Status: DC

## 2022-08-07 MED ORDER — OXYCODONE-ACETAMINOPHEN 5-325 MG PO TABS: 1.0000 | ORAL_TABLET | ORAL | 0 refills | Status: DC | PRN

## 2022-08-07 NOTE — Progress Notes (Signed)
PRN postop 

## 2022-08-08 ENCOUNTER — Telehealth: Payer: Self-pay

## 2022-08-08 NOTE — Telephone Encounter (Signed)
Called patient no answer left a voice to call the office.

## 2022-08-11 ENCOUNTER — Other Ambulatory Visit: Payer: Self-pay | Admitting: Family Medicine

## 2022-08-11 ENCOUNTER — Inpatient Hospital Stay: Payer: Medicare PPO

## 2022-08-11 DIAGNOSIS — R000 Tachycardia, unspecified: Secondary | ICD-10-CM

## 2022-08-11 DIAGNOSIS — I10 Essential (primary) hypertension: Secondary | ICD-10-CM

## 2022-08-12 ENCOUNTER — Ambulatory Visit (INDEPENDENT_AMBULATORY_CARE_PROVIDER_SITE_OTHER): Payer: Medicare PPO | Admitting: Podiatry

## 2022-08-12 DIAGNOSIS — M7752 Other enthesopathy of left foot: Secondary | ICD-10-CM

## 2022-08-12 DIAGNOSIS — M778 Other enthesopathies, not elsewhere classified: Secondary | ICD-10-CM

## 2022-08-12 DIAGNOSIS — Z9889 Other specified postprocedural states: Secondary | ICD-10-CM

## 2022-08-12 NOTE — Progress Notes (Signed)
Subjective:  Patient ID: Martha Hamilton, female    DOB: 1950-08-19,  MRN: [id number]  Chief Complaint  Patient presents with   Routine Post Op    POV #1 DOS 08/07/2022 EXCISION OF OS PERPMEUM LT, REPAIR OF PERONEAL TENDON LT/DR EVANS PT    DOS: 08/07/22 Procedure:  Chief Complaint  Patient presents with   Routine Post Op    POV #1 DOS 08/07/2022 EXCISION OF OS PERPMEUM LT, REPAIR OF PERONEAL TENDON LT/DR EVANS PT    72 y.o. female returns for post-op check.  Patient states that she is doing well.  She has been nonweightbearing with a walker.  She is a diabetic she denies any other acute complaints.  She is doing good on pain medications and does not need any more  Review of Systems: Negative except as noted in the HPI. Denies N/V/F/Ch.  Past Medical History:  Diagnosis Date   Allergic rhinitis    Breast cancer ([place]) 1996   right breast   DM2 (diabetes mellitus, type 2) (HCC)    GERD (gastroesophageal reflux disease)    Hx of cardiovascular stress test    a. Lex MV 2/14:  EF 69%, no ischemia   Hyperlipidemia    Hypertension    IDA (iron deficiency anemia) 08/27/2020   Iron deficiency anemia    Obesity    Osteoarthritis    hands   Ulcer     Current Outpatient Medications:    ACCU-CHEK AVIVA PLUS test strip, TEST BLOOD SUGAR EVERY DAY FOR DIABETES, Disp: 100 strip, Rfl: 1   Accu-Chek Softclix Lancets lancets, TEST BLOOD SUGAR EVERY DAY FOR DIABETES, Disp: 100 each, Rfl: 1   acetaminophen (TYLENOL) 500 MG tablet, Take 1,000 mg by mouth every 6 (six) hours as needed for mild pain. , Disp: , Rfl:    Alcohol Swabs (DROPSAFE ALCOHOL PREP) 70 % PADS, USE ONE TIME DAILY AS DIRECTED WHEN CHECKING BLOOD SUGAR, Disp: 100 each, Rfl: 1   amitriptyline (ELAVIL) 25 MG tablet, Take 1 tablet (25 mg total) by mouth at bedtime., Disp: 90 tablet, Rfl: 3   aspirin 81 MG tablet, Take 81 mg by mouth at bedtime. , Disp: , Rfl:    Bempedoic Acid 180 MG TABS, Take 1 tablet by mouth daily., Disp: 30  tablet, Rfl: 5   Blood Glucose Calibration (ACCU-CHEK AVIVA) SOLN, USE TO CALIBRATE METER AS DIRECTED, Disp: 1 each, Rfl: 2   Blood Glucose Monitoring Suppl (ACCU-CHEK AVIVA PLUS) w/Device KIT, Use to check blood sugar once daily for DM (dx. E11.9), Disp: 1 kit, Rfl: 0   calcium elemental as carbonate (BARIATRIC TUMS ULTRA) 400 MG chewable tablet, Chew 1,000 mg by mouth daily as needed for heartburn., Disp: , Rfl:    Camphor-Menthol-Methyl Sal (SALONPAS) 3.07-19-08 % PTCH, Apply topically., Disp: , Rfl:    Cholecalciferol (VITAMIN D) 2000 units tablet, Take 2,000 Units by mouth daily., Disp: , Rfl:    Cyanocobalamin (VITAMIN B-12) 1000 MCG SUBL, Place 1 tablet (1,000 mcg total) under the tongue daily., Disp: 90 tablet, Rfl: 1   Docusate Sodium (DSS) 100 MG CAPS, Take 1 capsule by mouth as needed., Disp: , Rfl:    ezetimibe (ZETIA) 10 MG tablet, Take 1 tablet (10 mg total) by mouth daily., Disp: 30 tablet, Rfl: 3   fenofibrate (TRICOR) 145 MG tablet, TAKE 1 TABLET EVERY DAY, Disp: 90 tablet, Rfl: 1   gabapentin (NEURONTIN) 300 MG capsule, Take 300 mg by mouth 3 (three) times daily as needed., Disp: ,  Rfl:    glipiZIDE (GLUCOTROL XL) 5 MG 24 hr tablet, TAKE 1 TABLET EVERY DAY WITH BREAKFAST, Disp: 90 tablet, Rfl: 1   loratadine (CLARITIN) 10 MG tablet, Take 10 mg by mouth daily as needed for allergies., Disp: , Rfl:    losartan (COZAAR) 50 MG tablet, Take 1 tablet (50 mg total) by mouth daily., Disp: 90 tablet, Rfl: 3   meclizine (ANTIVERT) 25 MG tablet, Take 1 tablet (25 mg total) by mouth 3 (three) times daily as needed for dizziness., Disp: 90 tablet, Rfl: 3   meloxicam (MOBIC) 15 MG tablet, TAKE 1 TABLET(15 MG) BY MOUTH DAILY, Disp: 30 tablet, Rfl: 1   metFORMIN (GLUCOPHAGE) 1000 MG tablet, Take 0.5 tablets (500 mg total) by mouth 2 (two) times daily with a meal., Disp: 90 tablet, Rfl: 3   metoprolol succinate (TOPROL-XL) 50 MG 24 hr tablet, Take 1 tablet (50 mg total) by mouth daily. Take with or  immediately following a meal., Disp: 90 tablet, Rfl: 3   omeprazole (PRILOSEC) 40 MG capsule, TAKE 1 CAPSULE IN THE MORNING AND AT BEDTIME., Disp: 180 capsule, Rfl: 3   ondansetron (ZOFRAN) 4 MG tablet, TAKE 1 TABLET EVERY 4 HOURS AS NEEDED FOR NAUSEA AND VOMITING, Disp: 90 tablet, Rfl: 0   oxyCODONE-acetaminophen (PERCOCET) 5-325 MG tablet, Take 1 tablet by mouth every 4 (four) hours as needed for severe pain., Disp: 30 tablet, Rfl: 0   SALINE NASAL SPRAY NA, Place 1 spray into the nose daily as needed (congestion). , Disp: , Rfl:   Social History   Tobacco Use  Smoking Status Never  Smokeless Tobacco Never    Allergies  Allergen Reactions   Cholestyramine Other (See Comments)    REACTION: Muscles tightened up, drew up.   Simvastatin Other (See Comments)    Muscular pain   Lipitor [Atorvastatin] Other (See Comments)    Muscle cramping   Niacin Nausea And Vomiting   Objective:  There were no vitals filed for this visit. There is no height or weight on file to calculate BMI. Constitutional Well developed. Well nourished.  Vascular Foot warm and well perfused. Capillary refill normal to all digits.   Neurologic Normal speech. Oriented to person, place, and time. Epicritic sensation to light touch grossly present bilaterally.  Dermatologic Skin healing well without signs of infection. Skin edges well coapted without signs of infection.  Orthopedic: Tenderness to palpation noted about the surgical site.   Radiographs: None Assessment:   1. Capsulitis of left foot   2. Os peroneum syndrome of left foot   3. Status post foot surgery    Plan:  Patient was evaluated and treated and all questions answered.  S/p foot surgery left -Progressing as expected post-operatively. -XR: None -WB Status: Nonweightbearing in left lower extremity with a walker -Sutures: Intact.  No clinical signs of dehiscence noted no complication noted. -Medications: None -Foot redressed.  No  follow-ups on file.

## 2022-08-19 ENCOUNTER — Ambulatory Visit (INDEPENDENT_AMBULATORY_CARE_PROVIDER_SITE_OTHER): Payer: Medicare PPO | Admitting: Podiatry

## 2022-08-19 ENCOUNTER — Encounter: Payer: Self-pay | Admitting: Podiatry

## 2022-08-19 VITALS — BP 170/81 | HR 96

## 2022-08-19 DIAGNOSIS — Z9889 Other specified postprocedural states: Secondary | ICD-10-CM

## 2022-08-19 NOTE — Progress Notes (Signed)
Chief Complaint  Patient presents with   Routine Post Op    "It doesn't hurt.  I haven't taken a pain pill since two Saturdays ago."    Subjective:  Patient presents today status post excision of os peroneum left foot.  DOS: 08/07/2022.  Patient states that she is doing very well.  Minimal pain.  She has been nonweightbearing in the cam boot as instructed.  Dressings clean dry and intact.  Past Medical History:  Diagnosis Date   Allergic rhinitis    Breast cancer ([place]) 1996   right breast   DM2 (diabetes mellitus, type 2) (HCC)    GERD (gastroesophageal reflux disease)    Hx of cardiovascular stress test    a. Lex MV 2/14:  EF 69%, no ischemia   Hyperlipidemia    Hypertension    IDA (iron deficiency anemia) 08/27/2020   Iron deficiency anemia    Obesity    Osteoarthritis    hands   Ulcer     Past Surgical History:  Procedure Laterality Date   APPENDECTOMY  1976   BREAST LUMPECTOMY Right 1996   right breast, lumpectomy w/nodes   CATARACT EXTRACTION W/PHACO Left 11/28/2020   Procedure: CATARACT EXTRACTION PHACO AND INTRAOCULAR LENS PLACEMENT ([place]) LEFT DIABETIC;  Surgeon: Leandrew Koyanagi, MD;  Location: [facility];  Service: Ophthalmology;  Laterality: Left;  5.02 01:07.4 7.4%   CATARACT EXTRACTION W/PHACO Right 12/12/2020   Procedure: CATARACT EXTRACTION PHACO AND INTRAOCULAR LENS PLACEMENT ([facility]) RIGHT DIABETIC 5.22 00:59.2;  Surgeon: Leandrew Koyanagi, MD;  Location: [facility];  Service: Ophthalmology;  Laterality: Right;   CHONDROPLASTY Left 02/10/2018   Procedure: CHONDROPLASTY;  Surgeon: Lovell Sheehan, MD;  Location: [facility];  Service: Orthopedics;  Laterality: Left;   GASTRIC RESTRICTION SURGERY     for reflux   HYSTEROSCOPY WITH D & C N/A 10/18/2012   Procedure: DILATATION AND CURETTAGE /HYSTEROSCOPY;  Surgeon: Mora Bellman, MD;  Location: [facility];  Service: Gynecology;  Laterality: N/A;   KNEE ARTHROSCOPY WITH MEDIAL MENISECTOMY Left  02/10/2018   Procedure: KNEE ARTHROSCOPY WITH MEDIAL and LATERAL MENISECTOMY;  Surgeon: Lovell Sheehan, MD;  Location: [facility];  Service: Orthopedics;  Laterality: Left;   POLYPECTOMY N/A 10/18/2012   Procedure: POLYPECTOMY;  Surgeon: Mora Bellman, MD;  Location: [facility];  Service: Gynecology;  Laterality: N/A;   SYNOVECTOMY Left 02/10/2018   Procedure: SYNOVECTOMY;  Surgeon: Lovell Sheehan, MD;  Location: [facility];  Service: Orthopedics;  Laterality: Left;    Allergies  Allergen Reactions   Cholestyramine Other (See Comments)    REACTION: Muscles tightened up, drew up.   Simvastatin Other (See Comments)    Muscular pain   Lipitor [Atorvastatin] Other (See Comments)    Muscle cramping   Niacin Nausea And Vomiting    Objective/Physical Exam Neurovascular status intact.  Incision well coapted with sutures intact. No sign of infectious process noted. No dehiscence. No active bleeding noted.  Moderate edema noted to the surgical extremity.   Assessment: 1. s/p excision of os peroneum left foot. DOS: 08/07/2022 -Sutures left intact today. -Recommend triple antibiotic over the incision site with a nonadherent gauze and Ace wrap daily.  Supplies provided -Continue NWB cam boot -RTC 2 weeks suture removal and x-ray   Edrick Kins, DPM [facility]  Dr. Edrick Kins, DPM    [address]  [place], [place] [address]                Office [phone]  Fax [phone]

## 2022-09-01 ENCOUNTER — Ambulatory Visit: Payer: Medicare PPO | Attending: Cardiology | Admitting: Cardiology

## 2022-09-01 ENCOUNTER — Encounter: Payer: Self-pay | Admitting: Cardiology

## 2022-09-01 VITALS — BP 150/82 | HR 77 | Ht 59.0 in | Wt 171.4 lb

## 2022-09-01 DIAGNOSIS — I10 Essential (primary) hypertension: Secondary | ICD-10-CM

## 2022-09-01 DIAGNOSIS — E781 Pure hyperglyceridemia: Secondary | ICD-10-CM | POA: Diagnosis not present

## 2022-09-01 MED ORDER — LOSARTAN POTASSIUM 100 MG PO TABS: 100.0000 mg | ORAL_TABLET | Freq: Every day | ORAL | 3 refills | Status: DC

## 2022-09-01 NOTE — Patient Instructions (Signed)
Medication Instructions:   INCREASE Losartan - take one tablet ( 100 mg) by mouth daily.   *If you need a refill on your cardiac medications before your next appointment, please call your pharmacy*   Lab Work:  None Ordered  If you have labs (blood work) drawn today and your tests are completely normal, you will receive your results only by: [place] (if you have MyChart) OR A paper copy in the mail If you have any lab test that is abnormal or we need to change your treatment, we will call you to review the results.   Testing/Procedures:  None Ordered   Follow-Up: At [facility], you and your health needs are our priority.  As part of our continuing mission to provide you with exceptional heart care, we have created designated Provider Care Teams.  These Care Teams include your primary Cardiologist (physician) and Advanced Practice Providers (APPs -  Physician Assistants and Nurse Practitioners) who all work together to provide you with the care you need, when you need it.  We recommend signing up for the patient portal called "MyChart".  Sign up information is provided on this After Visit Summary.  MyChart is used to connect with patients for Virtual Visits (Telemedicine).  Patients are able to view lab/test results, encounter notes, upcoming appointments, etc.  Non-urgent messages can be sent to your provider as well.   To learn more about what you can do with MyChart, go to [email or web address].    Your next appointment:   3 month(s)  Provider:   You may see Kate Sable, MD or one of the following Advanced Practice Providers on your designated Care Team:   Murray Hodgkins, NP Christell Faith, PA-C Cadence Kathlen Mody, PA-C Gerrie Nordmann, NP

## 2022-09-01 NOTE — Progress Notes (Signed)
Cardiology Office Note:    Date:  09/01/2022   ID:  Martha Hamilton, DOB October 12, 1950, MRN [id number]  PCP:  Abner Greenspan, MD   [facility] Prov[facility] Cardiologist:  Kate Sable, MD     Referring MD: Abner Greenspan, MD   Chief Complaint  Patient presents with   Follow-up    6 month f/u, no new cardiac concerns     History of Present Illness:    Martha Hamilton is a 72 y.o. female with a hx of hypertension, hyperlipidemia, diabetes, CKD 3 presenting for follow-up.   Being seen for elevated blood pressures, systolic has been ranging in the 150s at home.  Recently had foot surgery, left foot is in [employer].  Compliant with medications as prescribed, no new concerns.  Prior notes Echocardiogram 02/11/2022 EF 60 to 65% Cardiac monitor 02/05/2022 normal, no arrhythmias to suggest etiology of syncope.   Past Medical History:  Diagnosis Date   Allergic rhinitis    Breast cancer ([place]) 1996   right breast   DM2 (diabetes mellitus, type 2) (HCC)    GERD (gastroesophageal reflux disease)    Hx of cardiovascular stress test    a. Lex MV 2/14:  EF 69%, no ischemia   Hyperlipidemia    Hypertension    IDA (iron deficiency anemia) 08/27/2020   Iron deficiency anemia    Obesity    Osteoarthritis    hands   Ulcer     Past Surgical History:  Procedure Laterality Date   APPENDECTOMY  1976   BREAST LUMPECTOMY Right 1996   right breast, lumpectomy w/nodes   CATARACT EXTRACTION W/PHACO Left 11/28/2020   Procedure: CATARACT EXTRACTION PHACO AND INTRAOCULAR LENS PLACEMENT ([place]) LEFT DIABETIC;  Surgeon: Leandrew Koyanagi, MD;  Location: [facility];  Service: Ophthalmology;  Laterality: Left;  5.02 01:07.4 7.4%   CATARACT EXTRACTION W/PHACO Right 12/12/2020   Procedure: CATARACT EXTRACTION PHACO AND INTRAOCULAR LENS PLACEMENT ([facility]) RIGHT DIABETIC 5.22 00:59.2;  Surgeon: Leandrew Koyanagi, MD;  Location: [facility];  Service: Ophthalmology;  Laterality:  Right;   CHONDROPLASTY Left 02/10/2018   Procedure: CHONDROPLASTY;  Surgeon: Lovell Sheehan, MD;  Location: [facility];  Service: Orthopedics;  Laterality: Left;   GASTRIC RESTRICTION SURGERY     for reflux   HYSTEROSCOPY WITH D & C N/A 10/18/2012   Procedure: DILATATION AND CURETTAGE /HYSTEROSCOPY;  Surgeon: Mora Bellman, MD;  Location: [facility];  Service: Gynecology;  Laterality: N/A;   KNEE ARTHROSCOPY WITH MEDIAL MENISECTOMY Left 02/10/2018   Procedure: KNEE ARTHROSCOPY WITH MEDIAL and LATERAL MENISECTOMY;  Surgeon: Lovell Sheehan, MD;  Location: [facility];  Service: Orthopedics;  Laterality: Left;   POLYPECTOMY N/A 10/18/2012   Procedure: POLYPECTOMY;  Surgeon: Mora Bellman, MD;  Location: [facility];  Service: Gynecology;  Laterality: N/A;   SYNOVECTOMY Left 02/10/2018   Procedure: SYNOVECTOMY;  Surgeon: Lovell Sheehan, MD;  Location: [facility];  Service: Orthopedics;  Laterality: Left;    Current Medications: Current Meds  Medication Sig   ACCU-CHEK AVIVA PLUS test strip TEST BLOOD SUGAR EVERY DAY FOR DIABETES   Accu-Chek Softclix Lancets lancets TEST BLOOD SUGAR EVERY DAY FOR DIABETES   acetaminophen (TYLENOL) 500 MG tablet Take 1,000 mg by mouth every 6 (six) hours as needed for mild pain.    Alcohol Swabs (DROPSAFE ALCOHOL PREP) 70 % PADS USE ONE TIME DAILY AS DIRECTED WHEN CHECKING BLOOD SUGAR   amitriptyline (ELAVIL) 25 MG tablet Take 1 tablet (25 mg total)  by mouth at bedtime.   aspirin 81 MG tablet Take 81 mg by mouth at bedtime.    Bempedoic Acid 180 MG TABS Take 1 tablet by mouth daily.   Blood Glucose Calibration (ACCU-CHEK AVIVA) SOLN USE TO CALIBRATE METER AS DIRECTED   Blood Glucose Monitoring Suppl (ACCU-CHEK AVIVA PLUS) w/Device KIT Use to check blood sugar once daily for DM (dx. E11.9)   calcium elemental as carbonate (BARIATRIC TUMS ULTRA) 400 MG chewable tablet Chew 1,000 mg by mouth daily as needed for heartburn.   Camphor-Menthol-Methyl Sal (SALONPAS) 3.07-19-08 % PTCH  Apply topically.   Cholecalciferol (VITAMIN D) 2000 units tablet Take 2,000 Units by mouth daily.   Cyanocobalamin (VITAMIN B-12) 1000 MCG SUBL Place 1 tablet (1,000 mcg total) under the tongue daily.   Docusate Sodium (DSS) 100 MG CAPS Take 1 capsule by mouth as needed.   ezetimibe (ZETIA) 10 MG tablet Take 1 tablet (10 mg total) by mouth daily.   fenofibrate (TRICOR) 145 MG tablet TAKE 1 TABLET EVERY DAY   gabapentin (NEURONTIN) 300 MG capsule Take 300 mg by mouth 3 (three) times daily as needed.   glipiZIDE (GLUCOTROL XL) 5 MG 24 hr tablet TAKE 1 TABLET EVERY DAY WITH BREAKFAST   loratadine (CLARITIN) 10 MG tablet Take 10 mg by mouth daily as needed for allergies.   meclizine (ANTIVERT) 25 MG tablet Take 1 tablet (25 mg total) by mouth 3 (three) times daily as needed for dizziness.   meloxicam (MOBIC) 15 MG tablet TAKE 1 TABLET(15 MG) BY MOUTH DAILY   metFORMIN (GLUCOPHAGE) 1000 MG tablet Take 0.5 tablets (500 mg total) by mouth 2 (two) times daily with a meal.   metoprolol succinate (TOPROL-XL) 50 MG 24 hr tablet Take 1 tablet (50 mg total) by mouth daily. Take with or immediately following a meal.   omeprazole (PRILOSEC) 40 MG capsule TAKE 1 CAPSULE IN THE MORNING AND AT BEDTIME.   ondansetron (ZOFRAN) 4 MG tablet TAKE 1 TABLET EVERY 4 HOURS AS NEEDED FOR NAUSEA AND VOMITING   oxyCODONE-acetaminophen (PERCOCET) 5-325 MG tablet Take 1 tablet by mouth every 4 (four) hours as needed for severe pain.   SALINE NASAL SPRAY NA Place 1 spray into the nose daily as needed (congestion).    [DISCONTINUED] losartan (COZAAR) 50 MG tablet Take 1 tablet (50 mg total) by mouth daily.     Allergies:   Cholestyramine, Simvastatin, Lipitor [atorvastatin], and Niacin   Social History   Socioeconomic History   Marital status: Single    Spouse name: Not on file   Number of children: 0   Years of education: Not on file   Highest education level: Not on file  Occupational History   Occupation: retired     Fish farm manager: RETIRED  Tobacco Use   Smoking status: Never   Smokeless tobacco: Never  Vaping Use   Vaping Use: Never used  Substance and Sexual Activity   Alcohol use: Never    Alcohol/week: 0.0 standard drinks of alcohol   Drug use: No   Sexual activity: Not Currently    Birth control/protection: Post-menopausal  Other Topics Concern   Not on file  Social History Narrative   Lives with, and cares for her elderly mother.   Social Determinants of Health   Financial Resource Strain: Low Risk  (01/20/2022)   Overall Financial Resource Strain (CARDIA)    Difficulty of Paying Living Expenses: Not hard at all  Food Insecurity: No Food Insecurity (01/20/2022)   Hunger Vital Sign  Worried About Charity fundraiser in the Last Year: Never true    [place] in the Last Year: Never true  Transportation Needs: No Transportation Needs (01/20/2022)   PRAPARE - Hydrologist (Medical): No    Lack of Transportation (Non-Medical): No  Physical Activity: Insufficiently Active (01/20/2022)   Exercise Vital Sign    Days of Exercise per Week: 2 days    Minutes of Exercise per Session: 20 min  Stress: No Stress Concern Present (01/20/2022)   [place]    Feeling of Stress : Not at all  Social Connections: Moderately Isolated (01/20/2022)   Social Connection and Isolation Panel [NHANES]    Frequency of Communication with Friends and Family: More than three times a week    Frequency of Social Gatherings with Friends and Family: More than three times a week    Attends Religious Services: More than 4 times per year    Active Member of [employer] or Organizations: No    Attends Music therapist: Never    Marital Status: Never married     Family History: The patient's family history includes CAD (age of onset: 6) in her father; Diabetes in her mother; Esophageal cancer in her brother; Heart  disease in her mother and sister; Hypertension in her mother; Kidney cancer in her mother; Stroke in her father. There is no history of Colon cancer, Rectal cancer, Stomach cancer, or Pancreatic cancer.  ROS:   Please see the history of present illness.     All other systems reviewed and are negative.  EKGs/Labs/Other Studies Reviewed:    The following studies were reviewed today:   EKG:  EKG is  ordered today.  The ekg ordered today demonstrates normal sinus rhythm.  Recent Labs: 09/09/2021: TSH 1.17 02/28/2022: ALT 15; Magnesium 1.7 03/20/2022: BUN 31; Creatinine, Ser 1.07; Potassium 4.2; Sodium 137 04/29/2022: Hemoglobin 10.6; Platelets 410  Recent Lipid Panel    Component Value Date/Time   CHOL 140 05/29/2022 0742   TRIG 389 (H) 05/29/2022 0742   HDL 31 (L) 05/29/2022 0742   CHOLHDL 4.5 05/29/2022 0742   VLDL 78 (H) 05/29/2022 0742   LDLCALC 31 05/29/2022 0742   LDLDIRECT 89.0 12/11/2021 0754     Risk Assessment/Calculations:         Physical Exam:    VS:  BP (!) 150/82 (BP Location: Left Arm)   Pulse 77   Ht 4' 11"$  (1.499 m)   Wt 171 lb 6.4 oz (77.7 kg)   LMP 06/13/2001   SpO2 98%   BMI 34.62 kg/m     Wt Readings from Last 3 Encounters:  09/01/22 171 lb 6.4 oz (77.7 kg)  05/06/22 176 lb 9.6 oz (80.1 kg)  03/20/22 172 lb (78 kg)     GEN:  Well nourished, well developed in no acute distress HEENT: Normal NECK: No JVD; No carotid bruits CARDIAC: RRR, no murmurs, rubs, gallops RESPIRATORY:  Clear to auscultation without rales, wheezing or rhonchi  ABDOMEN: Soft, non-tender, non-distended MUSCULOSKELETAL:  No edema; No deformity  SKIN: Warm and dry NEUROLOGIC:  Alert and oriented x 3 PSYCHIATRIC:  Normal affect   ASSESSMENT:    1. Primary hypertension   2. High triglycerides     PLAN:    In order of problems listed above:  Hypertension, BP elevated,.  Increase losartan to 100 mg daily.  Continue Toprol-XL 25 mg daily.   Elevated  triglycerides,  improved with adding Zetia.  Continue Zetia, fenofibrate.  Follow-up in 3 months.     Medication Adjustments/Labs and Tests Ordered: Current medicines are reviewed at length with the patient today.  Concerns regarding medicines are outlined above.  Orders Placed This Encounter  Procedures   EKG 12-Lead   Meds ordered this encounter  Medications   losartan (COZAAR) 100 MG tablet    Sig: Take 1 tablet (100 mg total) by mouth daily.    Dispense:  90 tablet    Refill:  3    Patient Instructions  Medication Instructions:   INCREASE Losartan - take one tablet ( 100 mg) by mouth daily.   *If you need a refill on your cardiac medications before your next appointment, please call your pharmacy*   Lab Work:  None Ordered  If you have labs (blood work) drawn today and your tests are completely normal, you will receive your results only by: [place] (if you have MyChart) OR A paper copy in the mail If you have any lab test that is abnormal or we need to change your treatment, we will call you to review the results.   Testing/Procedures:  None Ordered   Follow-Up: At [facility], you and your health needs are our priority.  As part of our continuing mission to provide you with exceptional heart care, we have created designated Provider Care Teams.  These Care Teams include your primary Cardiologist (physician) and Advanced Practice Providers (APPs -  Physician Assistants and Nurse Practitioners) who all work together to provide you with the care you need, when you need it.  We recommend signing up for the patient portal called "MyChart".  Sign up information is provided on this After Visit Summary.  MyChart is used to connect with patients for Virtual Visits (Telemedicine).  Patients are able to view lab/test results, encounter notes, upcoming appointments, etc.  Non-urgent messages can be sent to your provider as well.   To learn more about what you can do with  MyChart, go to [email or web address].    Your next appointment:   3 month(s)  Provider:   You may see Kate Sable, MD or one of the following Advanced Practice Providers on your designated Care Team:   Murray Hodgkins, NP Christell Faith, PA-C Cadence Kathlen Mody, PA-C Gerrie Nordmann, NP   Signed, Kate Sable, MD  09/01/2022 11:47 AM    [place]

## 2022-09-02 ENCOUNTER — Ambulatory Visit (INDEPENDENT_AMBULATORY_CARE_PROVIDER_SITE_OTHER): Payer: Medicare PPO | Admitting: Podiatry

## 2022-09-02 ENCOUNTER — Ambulatory Visit (INDEPENDENT_AMBULATORY_CARE_PROVIDER_SITE_OTHER): Payer: Medicare PPO

## 2022-09-02 ENCOUNTER — Encounter: Payer: Self-pay | Admitting: Podiatry

## 2022-09-02 VITALS — BP 178/83 | HR 82

## 2022-09-02 DIAGNOSIS — Z9889 Other specified postprocedural states: Secondary | ICD-10-CM

## 2022-09-02 DIAGNOSIS — M7752 Other enthesopathy of left foot: Secondary | ICD-10-CM

## 2022-09-02 NOTE — Addendum Note (Signed)
Addended by: Lolita Rieger on: 09/02/2022 10:47 AM   Modules accepted: Orders

## 2022-09-02 NOTE — Progress Notes (Signed)
Chief Complaint  Patient presents with   Routine Post Op    "I think it's doing good."    Subjective:  Patient presents today status post excision of os peroneum left foot.  DOS: 08/07/2022.  Patient is doing very well.  She is currently WBAT in the cam boot with the assistance of a cane upon presentation today.  No new complaints at this time  Past Medical History:  Diagnosis Date   Allergic rhinitis    Breast cancer ([place]) 1996   right breast   DM2 (diabetes mellitus, type 2) (HCC)    GERD (gastroesophageal reflux disease)    Hx of cardiovascular stress test    a. Lex MV 2/14:  EF 69%, no ischemia   Hyperlipidemia    Hypertension    IDA (iron deficiency anemia) 08/27/2020   Iron deficiency anemia    Obesity    Osteoarthritis    hands   Ulcer     Past Surgical History:  Procedure Laterality Date   APPENDECTOMY  1976   BREAST LUMPECTOMY Right 1996   right breast, lumpectomy w/nodes   CATARACT EXTRACTION W/PHACO Left 11/28/2020   Procedure: CATARACT EXTRACTION PHACO AND INTRAOCULAR LENS PLACEMENT ([place]) LEFT DIABETIC;  Surgeon: Leandrew Koyanagi, MD;  Location: [facility];  Service: Ophthalmology;  Laterality: Left;  5.02 01:07.4 7.4%   CATARACT EXTRACTION W/PHACO Right 12/12/2020   Procedure: CATARACT EXTRACTION PHACO AND INTRAOCULAR LENS PLACEMENT ([facility]) RIGHT DIABETIC 5.22 00:59.2;  Surgeon: Leandrew Koyanagi, MD;  Location: [place];  Service: Ophthalmology;  Laterality: Right;   CHONDROPLASTY Left 02/10/2018   Procedure: CHONDROPLASTY;  Surgeon: Lovell Sheehan, MD;  Location: [facility];  Service: Orthopedics;  Laterality: Left;   GASTRIC RESTRICTION SURGERY     for reflux   HYSTEROSCOPY WITH D & C N/A 10/18/2012   Procedure: DILATATION AND CURETTAGE /HYSTEROSCOPY;  Surgeon: Mora Bellman, MD;  Location: [facility];  Service: Gynecology;  Laterality: N/A;   KNEE ARTHROSCOPY WITH MEDIAL MENISECTOMY Left 02/10/2018   Procedure: KNEE ARTHROSCOPY WITH  MEDIAL and LATERAL MENISECTOMY;  Surgeon: Lovell Sheehan, MD;  Location: [facility];  Service: Orthopedics;  Laterality: Left;   POLYPECTOMY N/A 10/18/2012   Procedure: POLYPECTOMY;  Surgeon: Mora Bellman, MD;  Location: [facility];  Service: Gynecology;  Laterality: N/A;   SYNOVECTOMY Left 02/10/2018   Procedure: SYNOVECTOMY;  Surgeon: Lovell Sheehan, MD;  Location: [facility];  Service: Orthopedics;  Laterality: Left;    Allergies  Allergen Reactions   Cholestyramine Other (See Comments)    REACTION: Muscles tightened up, drew up.   Simvastatin Other (See Comments)    Muscular pain   Lipitor [Atorvastatin] Other (See Comments)    Muscle cramping   Niacin Nausea And Vomiting    Objective/Physical Exam Neurovascular status intact.  Incision well coapted with sutures intact. No sign of infectious process noted. No dehiscence. No active bleeding noted.  Minimal edema noted to the surgical extremity.  Radiographic exam 09/02/2022 Absence of the os peroneum noted to the left foot.  No acute fractures.  Otherwise normal exam.   Assessment: 1. s/p excision of os peroneum left foot. DOS: 08/07/2022 - Sutures removed.  Compression ankle sleeve dispensed.  Wear daily -Continue WBAT cam boot x 3 additional weeks with the assistance of a cane -Return to clinic 3 weeks.  If the patient is doing well we will transition her out of the cam boot into good supportive tennis shoes and sneakers   Edrick Kins, DPM  [facility]  Dr. Edrick Kins, DPM    [address], [place] [address]                Office [phone]  Fax [phone]

## 2022-09-06 ENCOUNTER — Other Ambulatory Visit: Payer: Self-pay | Admitting: Family Medicine

## 2022-09-06 DIAGNOSIS — E119 Type 2 diabetes mellitus without complications: Secondary | ICD-10-CM

## 2022-09-08 ENCOUNTER — Inpatient Hospital Stay: Payer: Medicare PPO | Attending: Internal Medicine

## 2022-09-08 DIAGNOSIS — E538 Deficiency of other specified B group vitamins: Secondary | ICD-10-CM | POA: Insufficient documentation

## 2022-09-08 DIAGNOSIS — D509 Iron deficiency anemia, unspecified: Secondary | ICD-10-CM | POA: Insufficient documentation

## 2022-09-08 DIAGNOSIS — Z800 Family history of malignant neoplasm of digestive organs: Secondary | ICD-10-CM | POA: Diagnosis not present

## 2022-09-08 DIAGNOSIS — E119 Type 2 diabetes mellitus without complications: Secondary | ICD-10-CM | POA: Diagnosis not present

## 2022-09-08 DIAGNOSIS — Z853 Personal history of malignant neoplasm of breast: Secondary | ICD-10-CM | POA: Diagnosis not present

## 2022-09-08 DIAGNOSIS — I10 Essential (primary) hypertension: Secondary | ICD-10-CM | POA: Insufficient documentation

## 2022-09-08 DIAGNOSIS — Z809 Family history of malignant neoplasm, unspecified: Secondary | ICD-10-CM | POA: Insufficient documentation

## 2022-09-08 LAB — CBC WITH DIFFERENTIAL/PLATELET
Abs Immature Granulocytes: 0.02 10*3/uL (ref 0.00–0.07)
Basophils Absolute: 0 10*3/uL (ref 0.0–0.1)
Basophils Relative: 0 %
Eosinophils Absolute: 0.3 10*3/uL (ref 0.0–0.5)
Eosinophils Relative: 6 %
HCT: 34.6 % — ABNORMAL LOW (ref 36.0–46.0)
Hemoglobin: 11.1 g/dL — ABNORMAL LOW (ref 12.0–15.0)
Immature Granulocytes: 0 %
Lymphocytes Relative: 28 %
Lymphs Abs: 1.6 10*3/uL (ref 0.7–4.0)
MCH: 29.1 pg (ref 26.0–34.0)
MCHC: 32.1 g/dL (ref 30.0–36.0)
MCV: 90.6 fL (ref 80.0–100.0)
Monocytes Absolute: 0.4 10*3/uL (ref 0.1–1.0)
Monocytes Relative: 7 %
Neutro Abs: 3.4 10*3/uL (ref 1.7–7.7)
Neutrophils Relative %: 59 %
Platelets: 351 10*3/uL (ref 150–400)
RBC: 3.82 MIL/uL — ABNORMAL LOW (ref 3.87–5.11)
RDW: 14 % (ref 11.5–15.5)
WBC: 5.7 10*3/uL (ref 4.0–10.5)
nRBC: 0 % (ref 0.0–0.2)

## 2022-09-08 LAB — COMPREHENSIVE METABOLIC PANEL
ALT: 17 U/L (ref 0–44)
AST: 33 U/L (ref 15–41)
Albumin: 3.1 g/dL — ABNORMAL LOW (ref 3.5–5.0)
Alkaline Phosphatase: 48 U/L (ref 38–126)
Anion gap: 15 (ref 5–15)
BUN: 21 mg/dL (ref 8–23)
CO2: 24 mmol/L (ref 22–32)
Calcium: 9.1 mg/dL (ref 8.9–10.3)
Chloride: 100 mmol/L (ref 98–111)
Creatinine, Ser: 1.18 mg/dL — ABNORMAL HIGH (ref 0.44–1.00)
GFR, Estimated: 49 mL/min — ABNORMAL LOW (ref >60)
Glucose, Bld: 107 mg/dL — ABNORMAL HIGH (ref 70–99)
Potassium: 4.4 mmol/L (ref 3.5–5.1)
Sodium: 139 mmol/L (ref 135–145)
Total Bilirubin: 0.7 mg/dL (ref 0.3–1.2)
Total Protein: 6.5 g/dL (ref 6.5–8.1)

## 2022-09-08 LAB — VITAMIN B12: Vitamin B-12: 269 pg/mL (ref 180–914)

## 2022-09-08 LAB — IRON AND TIBC
Iron: 63 ug/dL (ref 28–170)
Saturation Ratios: 17 % (ref 10.4–31.8)
TIBC: 361 ug/dL (ref 250–450)
UIBC: 298 ug/dL

## 2022-09-08 LAB — FERRITIN: Ferritin: 67 ng/mL (ref 11–307)

## 2022-09-09 ENCOUNTER — Ambulatory Visit: Payer: Medicare PPO | Admitting: Oncology

## 2022-09-09 ENCOUNTER — Ambulatory Visit: Payer: Medicare PPO

## 2022-09-10 ENCOUNTER — Encounter: Payer: Self-pay | Admitting: Oncology

## 2022-09-10 ENCOUNTER — Inpatient Hospital Stay: Payer: Medicare PPO | Admitting: Oncology

## 2022-09-10 ENCOUNTER — Inpatient Hospital Stay: Payer: Medicare PPO

## 2022-09-10 VITALS — BP 146/79 | HR 105 | Temp 98.2°F | Resp 18 | Wt 172.0 lb

## 2022-09-10 DIAGNOSIS — Z853 Personal history of malignant neoplasm of breast: Secondary | ICD-10-CM | POA: Diagnosis not present

## 2022-09-10 DIAGNOSIS — Z800 Family history of malignant neoplasm of digestive organs: Secondary | ICD-10-CM | POA: Diagnosis not present

## 2022-09-10 DIAGNOSIS — I10 Essential (primary) hypertension: Secondary | ICD-10-CM | POA: Diagnosis not present

## 2022-09-10 DIAGNOSIS — Z809 Family history of malignant neoplasm, unspecified: Secondary | ICD-10-CM | POA: Diagnosis not present

## 2022-09-10 DIAGNOSIS — D508 Other iron deficiency anemias: Secondary | ICD-10-CM

## 2022-09-10 DIAGNOSIS — E538 Deficiency of other specified B group vitamins: Secondary | ICD-10-CM | POA: Diagnosis not present

## 2022-09-10 DIAGNOSIS — E119 Type 2 diabetes mellitus without complications: Secondary | ICD-10-CM | POA: Diagnosis not present

## 2022-09-10 DIAGNOSIS — D509 Iron deficiency anemia, unspecified: Secondary | ICD-10-CM | POA: Diagnosis not present

## 2022-09-10 MED ORDER — CYANOCOBALAMIN 1000 MCG/ML IJ SOLN
1000.0000 ug | Freq: Once | INTRAMUSCULAR | Status: AC
  Administered 2022-09-10: 1000 ug via INTRAMUSCULAR
  Filled 2022-09-10: qty 1

## 2022-09-10 MED ORDER — SODIUM CHLORIDE 0.9 % IV SOLN
INTRAVENOUS | Status: DC
  Filled 2022-09-10: qty 250

## 2022-09-10 MED ORDER — SODIUM CHLORIDE 0.9 % IV SOLN
200.0000 mg | Freq: Once | INTRAVENOUS | Status: AC
  Administered 2022-09-10: 200 mg via INTRAVENOUS
  Filled 2022-09-10: qty 200

## 2022-09-10 NOTE — Assessment & Plan Note (Signed)
Remote history of right breast cancer 1996 Continue annual screening mammogram.

## 2022-09-10 NOTE — Progress Notes (Signed)
Hematology/Oncology follow up note [phone] [phone]   Patient Care Team: [place], Wynelle Fanny, MD as PCP - General Kate Sable, MD as PCP - Cardiology (Cardiology) [name], [facility] as Pharmacist (Pharmacist)  ASSESSMENT & PLAN:   IDA (iron deficiency anemia) Iron deficiency anemia Chronic blood loss vs malabsorption.  She has had GI work up in the past.   Labs reviewed and discussed with patient.  Close iron panel and hemoglobin levels have improved. Hemoglobin is still not at baseline.  I recommend maintenance IV Venofer 200 mg x 1 today.  B12 deficiency  continue Vitamin B12 injection monthly. B12 level is stable.   History of breast cancer Remote history of right breast cancer 1996 Continue annual screening mammogram.   Orders Placed This Encounter  Procedures   CBC with Differential ([place] Only)    Standing Status:   Future    Standing Expiration Date:   09/11/2023   Iron and TIBC    Standing Status:   Future    Standing Expiration Date:   09/11/2023   Ferritin    Standing Status:   Future    Standing Expiration Date:   09/11/2023   CMP ([place] only)    Standing Status:   Future    Standing Expiration Date:   09/11/2023   Vitamin B12    Standing Status:   Future    Standing Expiration Date:   09/11/2023   Follow up  6 months. Labs prior to MD visit + Venofer and B12 injections.  All questions were answered. The patient knows to call the clinic with any problems, questions or concerns.  Earlie Server, MD, PhD [facility] Hematology Oncology 09/10/2022    CHIEF COMPLAINTS/REASON FOR VISIT:  Follow-up for iron deficiency anemia  HISTORY OF PRESENTING ILLNESS:  Martha Hamilton is a  72 y.o.  female with PMH listed below who was referred to me for evaluation of anemia Reviewed patient's recent labs that was done.  08/08/20 Labs revealed anemia with hemoglobin of 9.2, MCV 93 .   Reviewed patient's previous labs ordered by  primary care physician's office, anemia is chronic onset , duration is since 2013, with baseline in 11s, worsened during the recent 6 months, after she stops taking oral iron supplementation.   Associated signs and symptoms: Patient reports fatigue. denies SOB with exertion.  Denies weight loss, easy bruising, hematochezia, hemoptysis, hematuria. Context: History of GI bleeding: deneis               History of gastric restriction surgery for GERD                Last colonoscopy: 05/01/21. Last Endoscopy 05/01/21  Remote history of right breast cancer, s/p lumpectomy with node. She denies any previous chemotherapy or any anti estrogen therapy.     Family history positive for brother with esophageal cancer, mother with RCC.  I have recommended genetic testing.  She wanted to defer and if she changes her mind she will update me..   INTERVAL HISTORY Martha Hamilton is a 73 y.o. female who has above history reviewed by me today presents for follow up visit for iron deficiency anemia Patient has been on monthly vitamin B12 injection.  Today she reports feeling well.  Recent left foot surgery. + fatigue. Denies melena or blood in stool.  Review of Systems  Constitutional:  Positive for fatigue. Negative for appetite change, chills and fever.  HENT:   Negative for hearing loss and  voice change.   Eyes:  Negative for eye problems.  Respiratory:  Negative for chest tightness and cough.   Cardiovascular:  Negative for chest pain.  Gastrointestinal:  Negative for abdominal distention, abdominal pain and blood in stool.  Endocrine: Negative for hot flashes.  Genitourinary:  Negative for difficulty urinating and frequency.   Musculoskeletal:  Negative for arthralgias.  Skin:  Negative for itching and rash.  Neurological:  Negative for extremity weakness.  Hematological:  Negative for adenopathy.  Psychiatric/Behavioral:  Negative for confusion.      MEDICAL HISTORY:  Past Medical History:   Diagnosis Date   Allergic rhinitis    Breast cancer ([place]) 1996   right breast   DM2 (diabetes mellitus, type 2) (HCC)    GERD (gastroesophageal reflux disease)    Hx of cardiovascular stress test    a. Lex MV 2/14:  EF 69%, no ischemia   Hyperlipidemia    Hypertension    IDA (iron deficiency anemia) 08/27/2020   Iron deficiency anemia    Obesity    Osteoarthritis    hands   Ulcer     SURGICAL HISTORY: Past Surgical History:  Procedure Laterality Date   APPENDECTOMY  1976   BREAST LUMPECTOMY Right 1996   right breast, lumpectomy w/nodes   CATARACT EXTRACTION W/PHACO Left 11/28/2020   Procedure: CATARACT EXTRACTION PHACO AND INTRAOCULAR LENS PLACEMENT ([place]) LEFT DIABETIC;  Surgeon: Leandrew Koyanagi, MD;  Location: [place];  Service: Ophthalmology;  Laterality: Left;  5.02 01:07.4 7.4%   CATARACT EXTRACTION W/PHACO Right 12/12/2020   Procedure: CATARACT EXTRACTION PHACO AND INTRAOCULAR LENS PLACEMENT ([facility]) RIGHT DIABETIC 5.22 00:59.2;  Surgeon: Leandrew Koyanagi, MD;  Location: [place];  Service: Ophthalmology;  Laterality: Right;   CHONDROPLASTY Left 02/10/2018   Procedure: CHONDROPLASTY;  Surgeon: Lovell Sheehan, MD;  Location: [facility];  Service: Orthopedics;  Laterality: Left;   GASTRIC RESTRICTION SURGERY     for reflux   HYSTEROSCOPY WITH D & C N/A 10/18/2012   Procedure: DILATATION AND CURETTAGE /HYSTEROSCOPY;  Surgeon: Mora Bellman, MD;  Location: [facility];  Service: Gynecology;  Laterality: N/A;   KNEE ARTHROSCOPY WITH MEDIAL MENISECTOMY Left 02/10/2018   Procedure: KNEE ARTHROSCOPY WITH MEDIAL and LATERAL MENISECTOMY;  Surgeon: Lovell Sheehan, MD;  Location: [facility];  Service: Orthopedics;  Laterality: Left;   POLYPECTOMY N/A 10/18/2012   Procedure: POLYPECTOMY;  Surgeon: Mora Bellman, MD;  Location: [facility];  Service: Gynecology;  Laterality: N/A;   SYNOVECTOMY Left 02/10/2018   Procedure: SYNOVECTOMY;  Surgeon: Lovell Sheehan, MD;   Location: [facility];  Service: Orthopedics;  Laterality: Left;    SOCIAL HISTORY: Social History   Socioeconomic History   Marital status: Single    Spouse name: Not on file   Number of children: 0   Years of education: Not on file   Highest education level: Not on file  Occupational History   Occupation: retired    Fish farm manager: RETIRED  Tobacco Use   Smoking status: Never   Smokeless tobacco: Never  Vaping Use   Vaping Use: Never used  Substance and Sexual Activity   Alcohol use: Never    Alcohol/week: 0.0 standard drinks of alcohol   Drug use: No   Sexual activity: Not Currently    Birth control/protection: Post-menopausal  Other Topics Concern   Not on file  Social History Narrative   Lives with, and cares for her elderly mother.   Social Determinants of Health   Financial Resource Strain: Low  Risk  (01/20/2022)   Overall Financial Resource Strain (CARDIA)    Difficulty of Paying Living Expenses: Not hard at all  Food Insecurity: No Food Insecurity (01/20/2022)   Hunger Vital Sign    Worried About Running Out of Food in the Last Year: Never true    Ran Out of Food in the Last Year: Never true  Transportation Needs: No Transportation Needs (01/20/2022)   PRAPARE - Hydrologist (Medical): No    Lack of Transportation (Non-Medical): No  Physical Activity: Insufficiently Active (01/20/2022)   Exercise Vital Sign    Days of Exercise per Week: 2 days    Minutes of Exercise per Session: 20 min  Stress: No Stress Concern Present (01/20/2022)   [place]    Feeling of Stress : Not at all  Social Connections: Moderately Isolated (01/20/2022)   Social Connection and Isolation Panel [NHANES]    Frequency of Communication with Friends and Family: More than three times a week    Frequency of Social Gatherings with Friends and Family: More than three times a week    Attends Religious  Services: More than 4 times per year    Active Member of [employer] or Organizations: No    Attends Archivist Meetings: Never    Marital Status: Never married  Intimate Partner Violence: Not At Risk (01/20/2022)   Humiliation, Afraid, Rape, and Kick questionnaire    Fear of Current or Ex-Partner: No    Emotionally Abused: No    Physically Abused: No    Sexually Abused: No    FAMILY HISTORY: Family History  Problem Relation Age of Onset   Diabetes Mother    Heart disease Mother        Pacemaker, CHF   Hypertension Mother    Kidney cancer Mother    Stroke Father    CAD Father 17       Died with MI   Esophageal cancer Brother    Heart disease Sister    Colon cancer Neg Hx    Rectal cancer Neg Hx    Stomach cancer Neg Hx    Pancreatic cancer Neg Hx     ALLERGIES:  is allergic to cholestyramine, simvastatin, lipitor [atorvastatin], and niacin.  MEDICATIONS:  Current Outpatient Medications  Medication Sig Dispense Refill   ACCU-CHEK AVIVA PLUS test strip TEST BLOOD SUGAR EVERY DAY FOR DIABETES 100 strip 1   Accu-Chek Softclix Lancets lancets TEST BLOOD SUGAR EVERY DAY FOR DIABETES 100 each 1   acetaminophen (TYLENOL) 500 MG tablet Take 1,000 mg by mouth every 6 (six) hours as needed for mild pain.      Alcohol Swabs (DROPSAFE ALCOHOL PREP) 70 % PADS USE ONE TIME DAILY AS DIRECTED WHEN CHECKING BLOOD SUGAR 100 each 1   amitriptyline (ELAVIL) 25 MG tablet Take 1 tablet (25 mg total) by mouth at bedtime. 90 tablet 3   aspirin 81 MG tablet Take 81 mg by mouth at bedtime.      Bempedoic Acid 180 MG TABS Take 1 tablet by mouth daily. 30 tablet 5   Blood Glucose Calibration (ACCU-CHEK AVIVA) SOLN USE TO CALIBRATE METER AS DIRECTED 1 each 2   Blood Glucose Monitoring Suppl (ACCU-CHEK AVIVA PLUS) w/Device KIT Use to check blood sugar once daily for DM (dx. E11.9) 1 kit 0   calcium elemental as carbonate (BARIATRIC TUMS ULTRA) 400 MG chewable tablet Chew 1,000 mg by mouth daily as  needed for heartburn.     Camphor-Menthol-Methyl Sal (SALONPAS) 3.07-19-08 % PTCH Apply topically.     Cholecalciferol (VITAMIN D) 2000 units tablet Take 2,000 Units by mouth daily.     Docusate Sodium (DSS) 100 MG CAPS Take 1 capsule by mouth as needed.     ezetimibe (ZETIA) 10 MG tablet Take 1 tablet (10 mg total) by mouth daily. 30 tablet 3   fenofibrate (TRICOR) 145 MG tablet TAKE 1 TABLET EVERY DAY 90 tablet 1   gabapentin (NEURONTIN) 300 MG capsule Take 300 mg by mouth 3 (three) times daily as needed.     glipiZIDE (GLUCOTROL XL) 5 MG 24 hr tablet TAKE 1 TABLET EVERY DAY WITH BREAKFAST 90 tablet 1   loratadine (CLARITIN) 10 MG tablet Take 10 mg by mouth daily as needed for allergies.     losartan (COZAAR) 100 MG tablet Take 1 tablet (100 mg total) by mouth daily. 90 tablet 3   meclizine (ANTIVERT) 25 MG tablet Take 1 tablet (25 mg total) by mouth 3 (three) times daily as needed for dizziness. 90 tablet 3   metFORMIN (GLUCOPHAGE) 1000 MG tablet TAKE 1/2 TABLET TWICE DAILY WITH MEALS 90 tablet 0   metoprolol succinate (TOPROL-XL) 50 MG 24 hr tablet Take 1 tablet (50 mg total) by mouth daily. Take with or immediately following a meal. 90 tablet 3   omeprazole (PRILOSEC) 40 MG capsule TAKE 1 CAPSULE IN THE MORNING AND AT BEDTIME. 180 capsule 3   ondansetron (ZOFRAN) 4 MG tablet TAKE 1 TABLET EVERY 4 HOURS AS NEEDED FOR NAUSEA AND VOMITING 90 tablet 0   SALINE NASAL SPRAY NA Place 1 spray into the nose daily as needed (congestion).      oxyCODONE-acetaminophen (PERCOCET) 5-325 MG tablet Take 1 tablet by mouth every 4 (four) hours as needed for severe pain. (Patient not taking: Reported on 09/10/2022) 30 tablet 0   No current facility-administered medications for this visit.     PHYSICAL EXAMINATION: ECOG PERFORMANCE STATUS: 1 - Symptomatic but completely ambulatory Vitals:   09/10/22 1326  BP: (!) 146/79  Pulse: (!) 105  Resp: 18  Temp: 98.2 F (36.8 C)   Filed Weights   09/10/22 1326   Weight: 172 lb (78 kg)    Physical Exam Constitutional:      General: She is not in acute distress. HENT:     Head: Normocephalic and atraumatic.  Eyes:     General: No scleral icterus. Cardiovascular:     Rate and Rhythm: Normal rate and regular rhythm.     Heart sounds: Normal heart sounds.  Pulmonary:     Effort: Pulmonary effort is normal. No respiratory distress.     Breath sounds: No wheezing.  Abdominal:     General: Bowel sounds are normal. There is no distension.     Palpations: Abdomen is soft.  Musculoskeletal:        General: No deformity. Normal range of motion.     Cervical back: Normal range of motion and neck supple.  Skin:    General: Skin is warm and dry.     Findings: No erythema or rash.  Neurological:     Mental Status: She is alert and oriented to person, place, and time. Mental status is at baseline.     Cranial Nerves: No cranial nerve deficit.     Coordination: Coordination normal.  Psychiatric:        Mood and Affect: Mood normal.      LABORATORY DATA:  I have reviewed the data as listed    Latest Ref Rng & Units 09/08/2022    2:04 PM 04/29/2022   10:54 AM 02/28/2022   10:17 AM  CBC  WBC 4.0 - 10.5 K/uL 5.7  7.2  7.2   Hemoglobin 12.0 - 15.0 g/dL 11.1  10.6  11.7   Hematocrit 36.0 - 46.0 % 34.6  33.1  36.6   Platelets 150 - 400 K/uL 351  410  412.0       Latest Ref Rng & Units 09/08/2022    2:04 PM 03/20/2022    9:30 AM 02/28/2022   10:17 AM  CMP  Glucose 70 - 99 mg/dL 107  125  147   BUN 8 - 23 mg/dL '21  31  20   '$ Creatinine 0.44 - 1.00 mg/dL 1.18  1.07  1.10   Sodium 135 - 145 mmol/L 139  137  137   Potassium 3.5 - 5.1 mmol/L 4.4  4.2  4.3   Chloride 98 - 111 mmol/L 100  101  102   CO2 22 - 32 mmol/L '24  24  26   '$ Calcium 8.9 - 10.3 mg/dL 9.1  9.4  9.6   Total Protein 6.5 - 8.1 g/dL 6.5   7.5   Total Bilirubin 0.3 - 1.2 mg/dL 0.7   0.2   Alkaline Phos 38 - 126 U/L 48   40   AST 15 - 41 U/L 33   17   ALT 0 - 44 U/L 17   15       Iron/TIBC/Ferritin/ %Sat    Component Value Date/Time   IRON 63 09/08/2022 1404   TIBC 361 09/08/2022 1404   FERRITIN 67 09/08/2022 1404   IRONPCTSAT 17 09/08/2022 1404

## 2022-09-10 NOTE — Progress Notes (Signed)
Pt's IV site infiltrated to the LFA. Iron stopped and IV taken out. Per pharmacy to rotate with ice and heat every 15 mins to the site. Per Tasia Catchings, MD to utilize cold compression every 2-3 hours for 3 days if it develops skin irritation. Pt has been educated and understands. Site assessment is swollen and blue discoloration from iron. Pt stated it's not hurting.

## 2022-09-10 NOTE — Assessment & Plan Note (Signed)
continue Vitamin B12 injection monthly. B12 level is stable.

## 2022-09-10 NOTE — Assessment & Plan Note (Signed)
Iron deficiency anemia Chronic blood loss vs malabsorption.  She has had GI work up in the past.   Labs reviewed and discussed with patient.  Close iron panel and hemoglobin levels have improved. Hemoglobin is still not at baseline.  I recommend maintenance IV Venofer 200 mg x 1 today.

## 2022-09-23 ENCOUNTER — Encounter: Payer: Self-pay | Admitting: Podiatry

## 2022-09-23 ENCOUNTER — Ambulatory Visit (INDEPENDENT_AMBULATORY_CARE_PROVIDER_SITE_OTHER): Payer: Medicare PPO | Admitting: Podiatry

## 2022-09-23 VITALS — BP 138/74 | HR 91

## 2022-09-23 DIAGNOSIS — Z9889 Other specified postprocedural states: Secondary | ICD-10-CM

## 2022-09-23 NOTE — Progress Notes (Signed)
Chief Complaint  Patient presents with   Routine Post Op    "I think it's doing great.  I'm ready for this boot to come off."    Subjective:  Patient presents today status post excision of os peroneum left foot.  DOS: 08/07/2022.  Patient is doing very well.  She is currently WBAT in the cam boot with the assistance of a cane upon presentation today.  Patient has no pain or tenderness.  She states that she is ready to come out of the boot.  Past Medical History:  Diagnosis Date   Allergic rhinitis    Breast cancer ([place]) 1996   right breast   DM2 (diabetes mellitus, type 2) (HCC)    GERD (gastroesophageal reflux disease)    Hx of cardiovascular stress test    a. Lex MV 2/14:  EF 69%, no ischemia   Hyperlipidemia    Hypertension    IDA (iron deficiency anemia) 08/27/2020   Iron deficiency anemia    Obesity    Osteoarthritis    hands   Ulcer     Past Surgical History:  Procedure Laterality Date   APPENDECTOMY  1976   BREAST LUMPECTOMY Right 1996   right breast, lumpectomy w/nodes   CATARACT EXTRACTION W/PHACO Left 11/28/2020   Procedure: CATARACT EXTRACTION PHACO AND INTRAOCULAR LENS PLACEMENT ([place]) LEFT DIABETIC;  Surgeon: Leandrew Koyanagi, MD;  Location: [place];  Service: Ophthalmology;  Laterality: Left;  5.02 01:07.4 7.4%   CATARACT EXTRACTION W/PHACO Right 12/12/2020   Procedure: CATARACT EXTRACTION PHACO AND INTRAOCULAR LENS PLACEMENT ([facility]) RIGHT DIABETIC 5.22 00:59.2;  Surgeon: Leandrew Koyanagi, MD;  Location: [place];  Service: Ophthalmology;  Laterality: Right;   CHONDROPLASTY Left 02/10/2018   Procedure: CHONDROPLASTY;  Surgeon: Lovell Sheehan, MD;  Location: [facility];  Service: Orthopedics;  Laterality: Left;   GASTRIC RESTRICTION SURGERY     for reflux   HYSTEROSCOPY WITH D & C N/A 10/18/2012   Procedure: DILATATION AND CURETTAGE /HYSTEROSCOPY;  Surgeon: Mora Bellman, MD;  Location: [facility];  Service: Gynecology;  Laterality: N/A;    KNEE ARTHROSCOPY WITH MEDIAL MENISECTOMY Left 02/10/2018   Procedure: KNEE ARTHROSCOPY WITH MEDIAL and LATERAL MENISECTOMY;  Surgeon: Lovell Sheehan, MD;  Location: [facility];  Service: Orthopedics;  Laterality: Left;   POLYPECTOMY N/A 10/18/2012   Procedure: POLYPECTOMY;  Surgeon: Mora Bellman, MD;  Location: [facility];  Service: Gynecology;  Laterality: N/A;   SYNOVECTOMY Left 02/10/2018   Procedure: SYNOVECTOMY;  Surgeon: Lovell Sheehan, MD;  Location: [facility];  Service: Orthopedics;  Laterality: Left;    Allergies  Allergen Reactions   Cholestyramine Other (See Comments)    REACTION: Muscles tightened up, drew up.   Simvastatin Other (See Comments)    Muscular pain   Lipitor [Atorvastatin] Other (See Comments)    Muscle cramping   Niacin Nausea And Vomiting    Objective/Physical Exam Neurovascular status intact.  Incision healed.  No tenderness or pain with palpation throughout the lateral aspect of the foot and surgical site.  Muscle strength 5/5 all compartments.  Patient is able to dorsiflex the ankle and evert the foot well.  Radiographic exam 09/02/2022 Absence of the os peroneum noted to the left foot.  No acute fractures.  Otherwise normal exam.   Assessment: 1. s/p excision of os peroneum left foot. DOS: 08/07/2022  - Patient is doing very well in full weightbearing in the cam boot as instructed.  She is now about 7 weeks postop -She  may begin to transition out of the cam boot into good supportive tennis shoes and sneakers.  WBAT.  Slowly increase activity over the next 4 weeks. -Physical therapy was offered but ultimately the patient declined.  If she would like to pursue physical therapy she can come into the office for an order for physical therapy at [facility] -Return to clinic 8 weeks final follow-up  Edrick Kins, DPM [facility]  Dr. Edrick Kins, DPM    [address], [place] [address]                 Office [phone]  Fax [phone]

## 2022-09-29 ENCOUNTER — Encounter: Payer: Self-pay | Admitting: Family Medicine

## 2022-09-29 ENCOUNTER — Ambulatory Visit: Payer: Medicare PPO | Admitting: Family Medicine

## 2022-09-29 VITALS — BP 132/60 | HR 95 | Temp 98.1°F | Ht 59.0 in | Wt 166.2 lb

## 2022-09-29 DIAGNOSIS — E785 Hyperlipidemia, unspecified: Secondary | ICD-10-CM

## 2022-09-29 DIAGNOSIS — E119 Type 2 diabetes mellitus without complications: Secondary | ICD-10-CM | POA: Diagnosis not present

## 2022-09-29 DIAGNOSIS — I10 Essential (primary) hypertension: Secondary | ICD-10-CM

## 2022-09-29 DIAGNOSIS — G720 Drug-induced myopathy: Secondary | ICD-10-CM

## 2022-09-29 DIAGNOSIS — T466X5A Adverse effect of antihyperlipidemic and antiarteriosclerotic drugs, initial encounter: Secondary | ICD-10-CM

## 2022-09-29 DIAGNOSIS — E1169 Type 2 diabetes mellitus with other specified complication: Secondary | ICD-10-CM

## 2022-09-29 LAB — POCT GLYCOSYLATED HEMOGLOBIN (HGB A1C): Hemoglobin A1C: 5.7 % — AB (ref 4.0–5.6)

## 2022-09-29 NOTE — Assessment & Plan Note (Addendum)
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

## 2022-09-29 NOTE — Assessment & Plan Note (Signed)
Unable to take statins in the past  Is diabetic  Currently taking zetia for cholesterol LDL is controlled

## 2022-09-29 NOTE — Assessment & Plan Note (Addendum)
BP: 132/60  Better here and at home with addn of metoprolol xl 50 mg daily by cardiology (per note 25 but med list says 50?) Continues losartan also- was inc to 100 mg daily   Rev cardiology note from february

## 2022-09-29 NOTE — Progress Notes (Signed)
Subjective:    Patient ID: Martha Hamilton, female    DOB: 03-01-1951, 72 y.o.   MRN: [id number]  HPI Pt presents for f/u of DM2 and chronic medical problems   Wt Readings from Last 3 Encounters:  09/29/22 166 lb 4 oz (75.4 kg)  09/10/22 172 lb (78 kg)  09/01/22 171 lb 6.4 oz (77.7 kg)   33.58 kg/m  Vitals:   09/29/22 1450 09/29/22 1511  BP: (!) 142/66 132/60  Pulse: 95   Temp: 98.1 F (36.7 C)   SpO2: 98%     Doing ok overall   Stomach problems for 4 days - nausea and some pain  Omeprazole 40 mg daily  This happens once in a while   Is losing wt - intentional   Had 3rd gel shot for knees  Will need replacement in future - L knee is worse   HTN BP Readings from Last 3 Encounters:  09/29/22 132/60  09/23/22 138/74  09/10/22 (!) 146/79   Pulse Readings from Last 3 Encounters:  09/29/22 95  09/23/22 91  09/10/22 (!) 105      Losartan 50 mg daily -this was inc to 100 by cardiology  Metoprolol xl 50 mg daily (? Per cardiology she takes 25 mg?)   Lab Results  Component Value Date   CREATININE 1.18 (H) 09/08/2022   BUN 21 09/08/2022   NA 139 09/08/2022   K 4.4 09/08/2022   CL 100 09/08/2022   CO2 24 09/08/2022   GFR 49      DM2 Lab Results  Component Value Date   HGBA1C 6.8 (H) 03/20/2022   Today 5.7  Intentional weight loss  Doing some chair exercise   No sweets Staying away from bread No juices    Metformin 500 mg bid  Glipizide xl 5 mg daily     Eye exam is due in April  Had foot surgery recently  Did well / was with a boot 7 weeks and no wt bearing for 2 weeks Is much better now   Knees bother her    Takes arb Intol of statin-on zetia now   Diet   Hyperlipidemia Lab Results  Component Value Date   CHOL 140 05/29/2022   HDL 31 (L) 05/29/2022   LDLCALC 31 05/29/2022   LDLDIRECT 89.0 12/11/2021   TRIG 389 (H) 05/29/2022   CHOLHDL 4.5 05/29/2022   Takes zetia 10 mg daily  Tricor 145 mg daily     Patient  Active Problem List   Diagnosis Date Noted   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   Degenerative disc disease, cervical 03/03/2022   Muscle cramps 02/28/2022   Neck pain 02/28/2022   Syncope 12/18/2021   Strain of flexor tendon of wrist 12/02/2021   Tendinitis of left wrist 12/02/2021   Left wrist pain 11/13/2021   Family history of cancer 08/27/2020   History of breast cancer 08/27/2020   IDA (iron deficiency anemia) 08/27/2020   B12 deficiency 08/27/2020   Chronic kidney disease (CKD) stage G3b/A1, moderately decreased glomerular filtration rate (GFR) between 30-44 mL/min/1.73 square meter and albuminuria creatinine ratio less than 30 mg/g ([place]) 07/24/2020   Tachycardia 01/11/2019   Fatty liver 06/21/2018   Obesity (BMI 30-39.9) 06/21/2018   Dyspepsia 05/25/2018   GERD (gastroesophageal reflux disease) 05/25/2018   H/O vertigo 05/11/2018   Thrombocytosis 12/25/2017   Tear of medial meniscus of knee 12/21/2017   Pain in left knee 12/21/2017   Left knee pain 09/21/2017   Welcome to  Medicare preventive visit 12/17/2016   Grief reaction 12/17/2016   Estrogen deficiency 09/08/2016   Myalgia 02/08/2016   Normocytic anemia 02/09/2013   Abnormal EKG 07/27/2012   Special screening for malignant neoplasms, colon 04/29/2011   Gynecological examination 04/29/2011   Routine general medical examination at a health care facility 04/20/2011   OSTEOARTHRITIS, HANDS, BILATERAL 10/31/2008   Diabetes type 2, controlled ([place]) 07/10/2008   Hyperlipidemia associated with type 2 diabetes mellitus ([place]) 01/26/2008   Essential hypertension 01/26/2008   ALLERGIC RHINITIS 12/07/2007   Past Medical History:  Diagnosis Date   Allergic rhinitis    Breast cancer ([place]) 1996   right breast   DM2 (diabetes mellitus, type 2) ([facility])    GERD (gastroesophageal reflux disease)    Hx of cardiovascular stress test    a. Lex MV 2/14:  EF 69%, no ischemia   Hyperlipidemia    Hypertension    IDA (iron deficiency  anemia) 08/27/2020   Iron deficiency anemia    Obesity    Osteoarthritis    hands   Ulcer    Past Surgical History:  Procedure Laterality Date   APPENDECTOMY  1976   BREAST LUMPECTOMY Right 1996   right breast, lumpectomy w/nodes   CATARACT EXTRACTION W/PHACO Left 11/28/2020   Procedure: CATARACT EXTRACTION PHACO AND INTRAOCULAR LENS PLACEMENT ([place]) LEFT DIABETIC;  Surgeon: Leandrew Koyanagi, MD;  Location: [facility];  Service: Ophthalmology;  Laterality: Left;  5.02 01:07.4 7.4%   CATARACT EXTRACTION W/PHACO Right 12/12/2020   Procedure: CATARACT EXTRACTION PHACO AND INTRAOCULAR LENS PLACEMENT ([facility]) RIGHT DIABETIC 5.22 00:59.2;  Surgeon: Leandrew Koyanagi, MD;  Location: [facility];  Service: Ophthalmology;  Laterality: Right;   CHONDROPLASTY Left 02/10/2018   Procedure: CHONDROPLASTY;  Surgeon: Lovell Sheehan, MD;  Location: [facility];  Service: Orthopedics;  Laterality: Left;   GASTRIC RESTRICTION SURGERY     for reflux   HYSTEROSCOPY WITH D & C N/A 10/18/2012   Procedure: DILATATION AND CURETTAGE /HYSTEROSCOPY;  Surgeon: Mora Bellman, MD;  Location: [facility];  Service: Gynecology;  Laterality: N/A;   KNEE ARTHROSCOPY WITH MEDIAL MENISECTOMY Left 02/10/2018   Procedure: KNEE ARTHROSCOPY WITH MEDIAL and LATERAL MENISECTOMY;  Surgeon: Lovell Sheehan, MD;  Location: [facility];  Service: Orthopedics;  Laterality: Left;   POLYPECTOMY N/A 10/18/2012   Procedure: POLYPECTOMY;  Surgeon: Mora Bellman, MD;  Location: [facility];  Service: Gynecology;  Laterality: N/A;   SYNOVECTOMY Left 02/10/2018   Procedure: SYNOVECTOMY;  Surgeon: Lovell Sheehan, MD;  Location: [facility];  Service: Orthopedics;  Laterality: Left;   Social History   Tobacco Use   Smoking status: Never   Smokeless tobacco: Never  Vaping Use   Vaping Use: Never used  Substance Use Topics   Alcohol use: Never    Alcohol/week: 0.0 standard drinks of alcohol   Drug use: No   Family History  Problem  Relation Age of Onset   Diabetes Mother    Heart disease Mother        Pacemaker, CHF   Hypertension Mother    Kidney cancer Mother    Stroke Father    CAD Father 66       Died with MI   Esophageal cancer Brother    Heart disease Sister    Colon cancer Neg Hx    Rectal cancer Neg Hx    Stomach cancer Neg Hx    Pancreatic cancer Neg Hx    Allergies  Allergen Reactions   Cholestyramine Other (See  Comments)    REACTION: Muscles tightened up, drew up.   Simvastatin Other (See Comments)    Muscular pain   Lipitor [Atorvastatin] Other (See Comments)    Muscle cramping   Niacin Nausea And Vomiting   Current Outpatient Medications on File Prior to Visit  Medication Sig Dispense Refill   ACCU-CHEK AVIVA PLUS test strip TEST BLOOD SUGAR EVERY DAY FOR DIABETES 100 strip 1   Accu-Chek Softclix Lancets lancets TEST BLOOD SUGAR EVERY DAY FOR DIABETES 100 each 1   acetaminophen (TYLENOL) 500 MG tablet Take 1,000 mg by mouth every 6 (six) hours as needed for mild pain.      Alcohol Swabs (DROPSAFE ALCOHOL PREP) 70 % PADS USE ONE TIME DAILY AS DIRECTED WHEN CHECKING BLOOD SUGAR 100 each 1   amitriptyline (ELAVIL) 25 MG tablet Take 1 tablet (25 mg total) by mouth at bedtime. 90 tablet 3   aspirin 81 MG tablet Take 81 mg by mouth at bedtime.      Bempedoic Acid (NEXLETOL) 180 MG TABS Take 1 tablet by mouth daily.     Blood Glucose Calibration (ACCU-CHEK AVIVA) SOLN USE TO CALIBRATE METER AS DIRECTED 1 each 2   Blood Glucose Monitoring Suppl (ACCU-CHEK AVIVA PLUS) w/Device KIT Use to check blood sugar once daily for DM (dx. E11.9) 1 kit 0   calcium elemental as carbonate (BARIATRIC TUMS ULTRA) 400 MG chewable tablet Chew 1,000 mg by mouth daily as needed for heartburn.     Camphor-Menthol-Methyl Sal (SALONPAS) 3.07-19-08 % PTCH Apply topically.     celecoxib (CELEBREX) 200 MG capsule Take 200 mg by mouth daily as needed.     Cholecalciferol (VITAMIN D) 2000 units tablet Take 2,000 Units by mouth  daily.     Docusate Sodium (DSS) 100 MG CAPS Take 1 capsule by mouth 2 (two) times daily as needed.     ezetimibe (ZETIA) 10 MG tablet Take 1 tablet (10 mg total) by mouth daily. 30 tablet 3   fenofibrate (TRICOR) 145 MG tablet TAKE 1 TABLET EVERY DAY 90 tablet 1   glipiZIDE (GLUCOTROL XL) 5 MG 24 hr tablet TAKE 1 TABLET EVERY DAY WITH BREAKFAST 90 tablet 1   loratadine (CLARITIN) 10 MG tablet Take 10 mg by mouth daily as needed for allergies.     losartan (COZAAR) 100 MG tablet Take 1 tablet (100 mg total) by mouth daily. 90 tablet 3   meclizine (ANTIVERT) 25 MG tablet Take 1 tablet (25 mg total) by mouth 3 (three) times daily as needed for dizziness. 90 tablet 3   metFORMIN (GLUCOPHAGE) 1000 MG tablet TAKE 1/2 TABLET TWICE DAILY WITH MEALS 90 tablet 0   metoprolol succinate (TOPROL-XL) 50 MG 24 hr tablet Take 1 tablet (50 mg total) by mouth daily. Take with or immediately following a meal. 90 tablet 3   omeprazole (PRILOSEC) 40 MG capsule TAKE 1 CAPSULE IN THE MORNING AND AT BEDTIME. 180 capsule 3   ondansetron (ZOFRAN) 4 MG tablet TAKE 1 TABLET EVERY 4 HOURS AS NEEDED FOR NAUSEA AND VOMITING 90 tablet 0   SALINE NASAL SPRAY NA Place 1 spray into the nose daily as needed (congestion).      No current facility-administered medications on file prior to visit.    Review of Systems  Constitutional:  Negative for activity change, appetite change, fatigue, fever and unexpected weight change.  HENT:  Negative for congestion, ear pain, rhinorrhea, sinus pressure and sore throat.   Eyes:  Negative for pain, redness and visual  disturbance.  Respiratory:  Negative for cough, shortness of breath and wheezing.   Cardiovascular:  Negative for chest pain and palpitations.  Gastrointestinal:  Negative for abdominal pain, blood in stool, constipation and diarrhea.  Endocrine: Negative for polydipsia and polyuria.  Genitourinary:  Negative for dysuria, frequency and urgency.  Musculoskeletal:  Positive for  arthralgias. Negative for back pain and myalgias.       Foot pain is improving after surgery  Knee pain is worse on L   Skin:  Negative for pallor and rash.  Allergic/Immunologic: Negative for environmental allergies.  Neurological:  Negative for dizziness, syncope and headaches.  Hematological:  Negative for adenopathy. Does not bruise/bleed easily.  Psychiatric/Behavioral:  Negative for decreased concentration and dysphoric mood. The patient is not nervous/anxious.        Objective:   Physical Exam Constitutional:      General: She is not in acute distress.    Appearance: Normal appearance. She is well-developed. She is obese. She is not ill-appearing or diaphoretic.  HENT:     Head: Normocephalic and atraumatic.  Eyes:     General: No scleral icterus.    Conjunctiva/sclera: Conjunctivae normal.     Pupils: Pupils are equal, round, and reactive to light.  Neck:     Thyroid: No thyromegaly.     Vascular: No carotid bruit or JVD.  Cardiovascular:     Rate and Rhythm: Normal rate and regular rhythm.     Heart sounds: Normal heart sounds.     No gallop.  Pulmonary:     Effort: Pulmonary effort is normal. No respiratory distress.     Breath sounds: Normal breath sounds. No wheezing or rales.  Abdominal:     General: There is no distension or abdominal bruit.     Palpations: Abdomen is soft.  Musculoskeletal:     Cervical back: Normal range of motion and neck supple.     Right lower leg: No edema.     Left lower leg: No edema.  Lymphadenopathy:     Cervical: No cervical adenopathy.  Skin:    General: Skin is warm and dry.     Coloration: Skin is not pale.     Findings: No rash.  Neurological:     Mental Status: She is alert.     Coordination: Coordination normal.     Deep Tendon Reflexes: Reflexes are normal and symmetric. Reflexes normal.  Psychiatric:        Mood and Affect: Mood normal.           Assessment & Plan:   Problem List Items Addressed This Visit        Cardiovascular and Mediastinum   Essential hypertension    BP: 132/60  Better here and at home with addn of metoprolol xl 50 mg daily by cardiology (per note 25 but med list says 50?) Continues losartan also- was inc to 100 mg daily   Rev cardiology note from february      Relevant Medications   Bempedoic Acid (NEXLETOL) 180 MG TABS     Endocrine   Diabetes type 2, controlled ([place]) - Primary    Lab Results  Component Value Date   HGBA1C 5.7 (A) 09/29/2022  Very well controlled   Taking metformin 500 mg bid Also glipizide xl 5 mg daily  Pt denies any hypoglycemia at all and does monitor  I would like to stop the glipizide in the future if able  Enc her to keep up the good work  with diet /exercise and wt loss  Eye exam due next mo F/u in late July for annual exam       Relevant Orders   POCT HgB A1C (Completed)   Hyperlipidemia associated with type 2 diabetes mellitus ([place])    Disc goals for lipids and reasons to control them Rev last labs with pt Rev low sat fat diet in detail  Taking zetia  (has myopathy with statins)  Continues tricor 145 mg daily    last trig 389 Last LDL was 31 Also sees cardiology      Relevant Medications   Bempedoic Acid (NEXLETOL) 180 MG TABS     Musculoskeletal and Integument   Statin myopathy    Unable to take statins in the past  Is diabetic  Currently taking zetia for cholesterol LDL is controlled

## 2022-09-29 NOTE — Assessment & Plan Note (Signed)
Lab Results  Component Value Date   HGBA1C 5.7 (A) 09/29/2022   Very well controlled   Taking metformin 500 mg bid Also glipizide xl 5 mg daily  Pt denies any hypoglycemia at all and does monitor  I would like to stop the glipizide in the future if able  Enc her to keep up the good work with diet /exercise and wt loss  Eye exam due next mo F/u in late July for annual exam

## 2022-09-29 NOTE — Patient Instructions (Addendum)
Your diabetes is in good control  Keep up the good work with diet and add exercise as you can   Let me  know if you get low blood sugars (70 or below) or if it feels low  You mat not need to be on glipizide much longer   Blood pressure is better   Keep doing chair exercise for now   Schedule your in person annual exam for late July

## 2022-10-01 ENCOUNTER — Other Ambulatory Visit: Payer: Self-pay | Admitting: Family Medicine

## 2022-10-08 ENCOUNTER — Other Ambulatory Visit: Payer: Self-pay | Admitting: Family Medicine

## 2022-10-09 ENCOUNTER — Inpatient Hospital Stay: Payer: Medicare PPO | Attending: Internal Medicine

## 2022-10-09 DIAGNOSIS — E538 Deficiency of other specified B group vitamins: Secondary | ICD-10-CM | POA: Diagnosis not present

## 2022-10-09 DIAGNOSIS — D508 Other iron deficiency anemias: Secondary | ICD-10-CM

## 2022-10-09 MED ORDER — CYANOCOBALAMIN 1000 MCG/ML IJ SOLN
1000.0000 ug | Freq: Once | INTRAMUSCULAR | Status: AC
  Administered 2022-10-09: 1000 ug via INTRAMUSCULAR
  Filled 2022-10-09: qty 1

## 2022-10-23 ENCOUNTER — Other Ambulatory Visit: Payer: Self-pay

## 2022-10-23 MED ORDER — EZETIMIBE 10 MG PO TABS: 10.0000 mg | ORAL_TABLET | Freq: Every day | ORAL | 3 refills | Status: DC

## 2022-10-27 DIAGNOSIS — E119 Type 2 diabetes mellitus without complications: Secondary | ICD-10-CM | POA: Diagnosis not present

## 2022-10-27 LAB — HM DIABETES EYE EXAM

## 2022-11-10 ENCOUNTER — Inpatient Hospital Stay: Payer: Medicare PPO | Attending: Internal Medicine

## 2022-11-10 DIAGNOSIS — E538 Deficiency of other specified B group vitamins: Secondary | ICD-10-CM | POA: Insufficient documentation

## 2022-11-10 DIAGNOSIS — D508 Other iron deficiency anemias: Secondary | ICD-10-CM

## 2022-11-10 MED ORDER — CYANOCOBALAMIN 1000 MCG/ML IJ SOLN
1000.0000 ug | Freq: Once | INTRAMUSCULAR | Status: AC
  Administered 2022-11-10: 1000 ug via INTRAMUSCULAR
  Filled 2022-11-10: qty 1

## 2022-11-12 ENCOUNTER — Encounter: Payer: Self-pay | Admitting: Family Medicine

## 2022-11-12 ENCOUNTER — Ambulatory Visit: Payer: Medicare PPO | Admitting: Family Medicine

## 2022-11-12 VITALS — BP 144/65 | HR 106 | Temp 97.7°F | Ht 59.0 in | Wt 172.2 lb

## 2022-11-12 DIAGNOSIS — R600 Localized edema: Secondary | ICD-10-CM

## 2022-11-12 DIAGNOSIS — D473 Essential (hemorrhagic) thrombocythemia: Secondary | ICD-10-CM | POA: Diagnosis not present

## 2022-11-12 DIAGNOSIS — E669 Obesity, unspecified: Secondary | ICD-10-CM

## 2022-11-12 DIAGNOSIS — N1832 Chronic kidney disease, stage 3b: Secondary | ICD-10-CM

## 2022-11-12 DIAGNOSIS — E119 Type 2 diabetes mellitus without complications: Secondary | ICD-10-CM

## 2022-11-12 DIAGNOSIS — I10 Essential (primary) hypertension: Secondary | ICD-10-CM

## 2022-11-12 NOTE — Assessment & Plan Note (Signed)
BP: (!) 144/65  This is up  Also some pedal edema Encouraged to hold the celebrex (reach out to ortho) Increase fluid and decrease sodium intake  Continue losartan 100 mg daily  Metoprolol xl 50mg  daily   F/u 2 wk for re check  Aware beta blocker may add to edema also

## 2022-11-12 NOTE — Assessment & Plan Note (Signed)
Lab Results  Component Value Date   HGBA1C 5.7 (A) 09/29/2022   Very well controlled   Taking metformin 500 mg bid Also glipizide xl 5 mg daily  Pt denies any hypoglycemia at all and does monitor  I would like to stop the glipizide in the future if able  Enc her to keep up the good work with diet /exercise and wt loss  Eye exam due next mo F/u in late July for annual exam  

## 2022-11-12 NOTE — Assessment & Plan Note (Signed)
Dependent at end of day  No cardiac symptoms and rev echo from aug 2023 which was normal  Suspect multi factorial  Some meds- metoprolol, celebrex (instructed to hold celebrex) Venous insuff-discussed trying supp hose to knee again, also elevation  Encouraged lower sodium diet Encouraged more water intake  F/u 2 wk  Will check lab if not improved  Rev prior lab today with GFR 49 - may improve off nsaid  ER precautions noted Handouts given

## 2022-11-12 NOTE — Assessment & Plan Note (Signed)
Encouraged diet low in sugar and processed foods Exercise as tolerated

## 2022-11-12 NOTE — Assessment & Plan Note (Signed)
Pt instructed to hold celebrex Re check 2 wk

## 2022-11-12 NOTE — Patient Instructions (Addendum)
Try and drink more fluids  Aim for 60 oz (mostly water)   Avoid salty / processed foods   When you sit (not during chair exercise) try and put feet up   If swelling is bothersome - support socks/stockings to the knee are helpful     Stop the celebrex for now  Follow up in approx 2 weeks to re check BP and labs off of it  Hope this will help swelling as well    Consider touching base with ortho and asking about change back from celebrex to gabapentin in light of BP and ankle swelling  It is hard on kidney function also    If symptoms worsen before follow up let us know

## 2022-11-12 NOTE — Progress Notes (Signed)
Subjective:    Patient ID: Martha Hamilton, female    DOB: 11-Jan-1951, 72 y.o.   MRN: [id number]  HPI Pt presents with c/o ankle swelling  Wt Readings from Last 3 Encounters:  11/12/22 172 lb 4 oz (78.1 kg)  09/29/22 166 lb 4 oz (75.4 kg)  09/10/22 172 lb (78 kg)   34.79 kg/m  Vitals:   11/12/22 0932  BP: (!) 146/64  Pulse: (!) 106  Temp: 97.7 F (36.5 C)  SpO2: 96%    By end of the day her ankles are swollen  This is new  Since the weather got warmer  Makes it hard to walk  Feels tight ,  hurts only if shoes are too tight  No itching    Has eaten more salty food lately   She takes celebrex  Was previously on gabapentin but then her doctor decided not to refill it  It worked well  Neck problems    Fluids- 32 oz per day   No cp  No more sob than usual   Doing chair exercise (chair yoga)    HTN  Losartan 100 mg daily  Metoprolol xl 50 mg daily   Pulse Readings from Last 3 Encounters:  11/12/22 (!) 106  09/29/22 95  09/23/22 91      Chemistry      Component Value Date/Time   NA 139 09/08/2022 1404   K 4.4 09/08/2022 1404   CL 100 09/08/2022 1404   CO2 24 09/08/2022 1404   BUN 21 09/08/2022 1404   CREATININE 1.18 (H) 09/08/2022 1404      Component Value Date/Time   CALCIUM 9.1 09/08/2022 1404   ALKPHOS 48 09/08/2022 1404   AST 33 09/08/2022 1404   ALT 17 09/08/2022 1404   BILITOT 0.7 09/08/2022 1404     GFR of 49 in feb  Echocardiogram 02/2022 -normal with normal EF  Lab Results  Component Value Date   WBC 5.7 09/08/2022   HGB 11.1 (L) 09/08/2022   HCT 34.6 (L) 09/08/2022   MCV 90.6 09/08/2022   PLT 351 09/08/2022  H/o iron def  Has had GI f/u and sees heme  Getting IV iron    Patient Active Problem List   Diagnosis Date Noted   Pedal edema 11/12/2022   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   Degenerative disc disease, cervical 03/03/2022   Muscle cramps 02/28/2022   Neck pain 02/28/2022   Syncope 12/18/2021   Strain of flexor  tendon of wrist 12/02/2021   Tendinitis of left wrist 12/02/2021   Left wrist pain 11/13/2021   Family history of cancer 08/27/2020   History of breast cancer 08/27/2020   IDA (iron deficiency anemia) 08/27/2020   B12 deficiency 08/27/2020   Chronic kidney disease (CKD) stage G3b/A1, moderately decreased glomerular filtration rate (GFR) between 30-44 mL/min/1.73 square meter and albuminuria creatinine ratio less than 30 mg/g (HCC) 07/24/2020   Tachycardia 01/11/2019   Fatty liver 06/21/2018   Obesity (BMI 30-39.9) 06/21/2018   Dyspepsia 05/25/2018   GERD (gastroesophageal reflux disease) 05/25/2018   H/O vertigo 05/11/2018   Tear of medial meniscus of knee 12/21/2017   Pain in left knee 12/21/2017   Left knee pain 09/21/2017   Welcome to Medicare preventive visit 12/17/2016   Grief reaction 12/17/2016   Estrogen deficiency 09/08/2016   Myalgia 02/08/2016   Normocytic anemia 02/09/2013   Abnormal EKG 07/27/2012   Special screening for malignant neoplasms, colon 04/29/2011   Gynecological examination 04/29/2011   Routine  general medical examination at a health care facility 04/20/2011   OSTEOARTHRITIS, HANDS, BILATERAL 10/31/2008   Diabetes type 2, controlled (HCC) 07/10/2008   Hyperlipidemia associated with type 2 diabetes mellitus (HCC) 01/26/2008   Essential hypertension 01/26/2008   ALLERGIC RHINITIS 12/07/2007   Past Medical History:  Diagnosis Date   Allergic rhinitis    Breast cancer (HCC) 1996   right breast   DM2 (diabetes mellitus, type 2) (HCC)    GERD (gastroesophageal reflux disease)    Hx of cardiovascular stress test    a. Lex MV 2/14:  EF 69%, no ischemia   Hyperlipidemia    Hypertension    IDA (iron deficiency anemia) 08/27/2020   Iron deficiency anemia    Obesity    Osteoarthritis    hands   Ulcer    Past Surgical History:  Procedure Laterality Date   APPENDECTOMY  1976   BREAST LUMPECTOMY Right 1996   right breast, lumpectomy w/nodes   CATARACT  EXTRACTION W/PHACO Left 11/28/2020   Procedure: CATARACT EXTRACTION PHACO AND INTRAOCULAR LENS PLACEMENT (IOC) LEFT DIABETIC;  Surgeon: Lockie Mola, MD;  Location: [facility];  Service: Ophthalmology;  Laterality: Left;  5.02 01:07.4 7.4%   CATARACT EXTRACTION W/PHACO Right 12/12/2020   Procedure: CATARACT EXTRACTION PHACO AND INTRAOCULAR LENS PLACEMENT (IOC) RIGHT DIABETIC 5.22 00:59.2;  Surgeon: Lockie Mola, MD;  Location: [facility];  Service: Ophthalmology;  Laterality: Right;   CHONDROPLASTY Left 02/10/2018   Procedure: CHONDROPLASTY;  Surgeon: Lyndle Herrlich, MD;  Location: [facility];  Service: Orthopedics;  Laterality: Left;   GASTRIC RESTRICTION SURGERY     for reflux   HYSTEROSCOPY WITH D & C N/A 10/18/2012   Procedure: DILATATION AND CURETTAGE /HYSTEROSCOPY;  Surgeon: Catalina Antigua, MD;  Location: [facility];  Service: Gynecology;  Laterality: N/A;   KNEE ARTHROSCOPY WITH MEDIAL MENISECTOMY Left 02/10/2018   Procedure: KNEE ARTHROSCOPY WITH MEDIAL and LATERAL MENISECTOMY;  Surgeon: Lyndle Herrlich, MD;  Location: [facility];  Service: Orthopedics;  Laterality: Left;   POLYPECTOMY N/A 10/18/2012   Procedure: POLYPECTOMY;  Surgeon: Catalina Antigua, MD;  Location: [facility];  Service: Gynecology;  Laterality: N/A;   SYNOVECTOMY Left 02/10/2018   Procedure: SYNOVECTOMY;  Surgeon: Lyndle Herrlich, MD;  Location: [facility];  Service: Orthopedics;  Laterality: Left;   Social History   Tobacco Use   Smoking status: Never   Smokeless tobacco: Never  Vaping Use   Vaping Use: Never used  Substance Use Topics   Alcohol use: Never    Alcohol/week: 0.0 standard drinks of alcohol   Drug use: No   Family History  Problem Relation Age of Onset   Diabetes Mother    Heart disease Mother        Pacemaker, CHF   Hypertension Mother    Kidney cancer Mother    Stroke Father    CAD Father 56       Died with MI   Esophageal cancer Brother    Heart disease Sister    Colon  cancer Neg Hx    Rectal cancer Neg Hx    Stomach cancer Neg Hx    Pancreatic cancer Neg Hx    Allergies  Allergen Reactions   Cholestyramine Other (See Comments)    REACTION: Muscles tightened up, drew up.   Simvastatin Other (See Comments)    Muscular pain   Lipitor [Atorvastatin] Other (See Comments)    Muscle cramping   Niacin Nausea And Vomiting   Current Outpatient Medications on  File Prior to Visit  Medication Sig Dispense Refill   Accu-Chek Softclix Lancets lancets TEST BLOOD SUGAR EVERY DAY FOR DIABETES 100 each 1   acetaminophen (TYLENOL) 500 MG tablet Take 1,000 mg by mouth every 6 (six) hours as needed for mild pain.      Alcohol Swabs (DROPSAFE ALCOHOL PREP) 70 % PADS USE ONE TIME DAILY AS DIRECTED WHEN CHECKING BLOOD SUGAR 100 each 1   amitriptyline (ELAVIL) 25 MG tablet TAKE 1 TABLET AT BEDTIME 90 tablet 1   aspirin 81 MG tablet Take 81 mg by mouth at bedtime.      Bempedoic Acid (NEXLETOL) 180 MG TABS Take 1 tablet by mouth daily.     Blood Glucose Calibration (ACCU-CHEK AVIVA) SOLN USE TO CALIBRATE METER AS DIRECTED 1 each 2   Blood Glucose Monitoring Suppl (ACCU-CHEK AVIVA PLUS) w/Device KIT Use to check blood sugar once daily for DM (dx. E11.9) 1 kit 0   calcium elemental as carbonate (BARIATRIC TUMS ULTRA) 400 MG chewable tablet Chew 1,000 mg by mouth daily as needed for heartburn.     Camphor-Menthol-Methyl Sal (SALONPAS) 3.07-19-08 % PTCH Apply topically.     Cholecalciferol (VITAMIN D) 2000 units tablet Take 2,000 Units by mouth daily.     Docusate Sodium (DSS) 100 MG CAPS Take 1 capsule by mouth 2 (two) times daily as needed.     ezetimibe (ZETIA) 10 MG tablet Take 1 tablet (10 mg total) by mouth daily. 30 tablet 3   fenofibrate (TRICOR) 145 MG tablet TAKE 1 TABLET EVERY DAY 90 tablet 1   glipiZIDE (GLUCOTROL XL) 5 MG 24 hr tablet TAKE 1 TABLET EVERY DAY WITH BREAKFAST 90 tablet 1   glucose blood (ACCU-CHEK AVIVA PLUS) test strip TEST BLOOD SUGAR EVERY DAY FOR  DIABETES 100 strip 1   loratadine (CLARITIN) 10 MG tablet Take 10 mg by mouth daily as needed for allergies.     losartan (COZAAR) 100 MG tablet Take 1 tablet (100 mg total) by mouth daily. 90 tablet 3   meclizine (ANTIVERT) 25 MG tablet Take 1 tablet (25 mg total) by mouth 3 (three) times daily as needed for dizziness. 90 tablet 3   metFORMIN (GLUCOPHAGE) 1000 MG tablet TAKE 1/2 TABLET TWICE DAILY WITH MEALS 90 tablet 0   metoprolol succinate (TOPROL-XL) 50 MG 24 hr tablet Take 1 tablet (50 mg total) by mouth daily. Take with or immediately following a meal. 90 tablet 3   omeprazole (PRILOSEC) 40 MG capsule TAKE 1 CAPSULE IN THE MORNING AND AT BEDTIME. 180 capsule 3   ondansetron (ZOFRAN) 4 MG tablet TAKE 1 TABLET EVERY 4 HOURS AS NEEDED FOR NAUSEA AND VOMITING 90 tablet 0   SALINE NASAL SPRAY NA Place 1 spray into the nose daily as needed (congestion).      celecoxib (CELEBREX) 200 MG capsule Take 200 mg by mouth daily as needed. (Patient not taking: Reported on 11/12/2022)     No current facility-administered medications on file prior to visit.    Review of Systems  Constitutional:  Negative for activity change, appetite change, fatigue, fever and unexpected weight change.  HENT:  Negative for congestion, ear pain, rhinorrhea, sinus pressure and sore throat.   Eyes:  Negative for pain, redness and visual disturbance.  Respiratory:  Negative for cough, shortness of breath and wheezing.        No more sob than usual  Cardiovascular:  Positive for leg swelling. Negative for chest pain and palpitations.  Gastrointestinal:  Negative for  abdominal pain, blood in stool, constipation and diarrhea.  Endocrine: Negative for polydipsia and polyuria.  Genitourinary:  Negative for dysuria, frequency and urgency.  Musculoskeletal:  Negative for arthralgias, back pain and myalgias.  Skin:  Negative for pallor and rash.  Allergic/Immunologic: Negative for environmental allergies.  Neurological:  Negative  for dizziness, syncope and headaches.  Hematological:  Negative for adenopathy. Does not bruise/bleed easily.  Psychiatric/Behavioral:  Negative for decreased concentration and dysphoric mood. The patient is not nervous/anxious.        Objective:   Physical Exam Constitutional:      General: She is not in acute distress.    Appearance: She is well-developed.  HENT:     Head: Normocephalic and atraumatic.  Eyes:     Conjunctiva/sclera: Conjunctivae normal.     Pupils: Pupils are equal, round, and reactive to light.  Neck:     Thyroid: No thyromegaly.     Vascular: No carotid bruit or JVD.  Cardiovascular:     Rate and Rhythm: Normal rate and regular rhythm.     Heart sounds: Normal heart sounds.     No gallop.  Pulmonary:     Effort: Pulmonary effort is normal. No respiratory distress.     Breath sounds: Normal breath sounds. No wheezing or rales.  Abdominal:     General: There is no distension or abdominal bruit.     Palpations: Abdomen is soft.  Musculoskeletal:     Cervical back: Normal range of motion and neck supple.     Right lower leg: No edema.     Left lower leg: No edema.     Comments: No edema now Is early in day- states this happens at end of day  Lymphadenopathy:     Cervical: No cervical adenopathy.  Skin:    General: Skin is warm and dry.     Coloration: Skin is not pale.     Findings: No rash.  Neurological:     Mental Status: She is alert.     Cranial Nerves: No cranial nerve deficit.     Motor: No weakness.     Coordination: Coordination normal.     Deep Tendon Reflexes: Reflexes are normal and symmetric. Reflexes normal.  Psychiatric:        Mood and Affect: Mood normal.           Assessment & Plan:   Problem List Items Addressed This Visit       Cardiovascular and Mediastinum   Essential hypertension    BP: (!) 144/65  This is up  Also some pedal edema Encouraged to hold the celebrex (reach out to ortho) Increase fluid and decrease  sodium intake  Continue losartan 100 mg daily  Metoprolol xl 50mg  daily   F/u 2 wk for re check  Aware beta blocker may add to edema also        Endocrine   Diabetes type 2, controlled (HCC)    Lab Results  Component Value Date   HGBA1C 5.7 (A) 09/29/2022  Very well controlled   Taking metformin 500 mg bid Also glipizide xl 5 mg daily  Pt denies any hypoglycemia at all and does monitor  I would like to stop the glipizide in the future if able  Enc her to keep up the good work with diet /exercise and wt loss  Eye exam due next mo F/u in late July for annual exam         Genitourinary   Chronic kidney  disease (CKD) stage G3b/A1, moderately decreased glomerular filtration rate (GFR) between 30-44 mL/min/1.73 square meter and albuminuria creatinine ratio less than 30 mg/g (HCC)    Pt instructed to hold celebrex Re check 2 wk         Hematopoietic and Hemostatic   Essential (hemorrhagic) thrombocythemia (HCC)     Other   Obesity (BMI 30-39.9)    Encouraged diet low in sugar and processed foods Exercise as tolerated      Pedal edema - Primary    Dependent at end of day  No cardiac symptoms and rev echo from aug 2023 which was normal  Suspect multi factorial  Some meds- metoprolol, celebrex (instructed to hold celebrex) Venous insuff-discussed trying supp hose to knee again, also elevation  Encouraged lower sodium diet Encouraged more water intake  F/u 2 wk  Will check lab if not improved  Rev prior lab today with GFR 49 - may improve off nsaid  ER precautions noted Handouts given

## 2022-11-20 ENCOUNTER — Other Ambulatory Visit: Payer: Self-pay | Admitting: Family Medicine

## 2022-11-20 DIAGNOSIS — E119 Type 2 diabetes mellitus without complications: Secondary | ICD-10-CM

## 2022-11-26 ENCOUNTER — Ambulatory Visit: Payer: Medicare PPO | Admitting: Family Medicine

## 2022-11-26 ENCOUNTER — Encounter: Payer: Self-pay | Admitting: Family Medicine

## 2022-11-26 VITALS — BP 130/70 | HR 100 | Temp 97.9°F | Ht 59.0 in | Wt 176.2 lb

## 2022-11-26 DIAGNOSIS — I10 Essential (primary) hypertension: Secondary | ICD-10-CM | POA: Diagnosis not present

## 2022-11-26 DIAGNOSIS — N1832 Chronic kidney disease, stage 3b: Secondary | ICD-10-CM

## 2022-11-26 DIAGNOSIS — M5030 Other cervical disc degeneration, unspecified cervical region: Secondary | ICD-10-CM | POA: Diagnosis not present

## 2022-11-26 DIAGNOSIS — R600 Localized edema: Secondary | ICD-10-CM

## 2022-11-26 NOTE — Progress Notes (Signed)
Subjective:    Patient ID: Martha Hamilton, female    DOB: 11-Apr-1951, 72 y.o.   MRN: [id number]  HPI Pt presents for f/u of pedal edema and HTN   Wt Readings from Last 3 Encounters:  11/26/22 176 lb 4 oz (79.9 kg)  11/12/22 172 lb 4 oz (78.1 kg)  09/29/22 166 lb 4 oz (75.4 kg)   35.60 kg/m  Vitals:   11/26/22 0920  BP: 136/78  Pulse: 100  Temp: 97.9 F (36.6 C)  SpO2: 97%    HTN bp is stable today  No cp or palpitations or headaches or edema  No side effects to medicines  BP Readings from Last 3 Encounters:  11/26/22 130/70  11/12/22 (!) 144/65  09/29/22 132/60     Losartan 100 mg daily  Metoprolol xl 50 mg dialy   Pulse Readings from Last 3 Encounters:  11/26/22 100  11/12/22 (!) 106  09/29/22 95   Stopping the celebrex really helped   Changed to gabapendin - helps her pain in hand/neck /knees   Still drinking water Wears support stockings during the day  They are helping also    Last visit discussed multi factorial pedal edema  Instructed to hold her celebrex Noted no cardiac symptoms and reviewed echo from 02/2022  Encouraged use of supp hose Reviewed meds that can cause swelling Encouraged lower sodium diet  Water intake is good   Last metabolic panel Lab Results  Component Value Date   GLUCOSE 107 (H) 09/08/2022   NA 139 09/08/2022   K 4.4 09/08/2022   CL 100 09/08/2022   CO2 24 09/08/2022   BUN 21 09/08/2022   CREATININE 1.18 (H) 09/08/2022   GFRNONAA 49 (L) 09/08/2022   CALCIUM 9.1 09/08/2022   PHOS 3.4 04/29/2010   PROT 6.5 09/08/2022   ALBUMIN 3.1 (L) 09/08/2022   LABGLOB 3.8 08/27/2020   AGRATIO 0.9 08/27/2020   BILITOT 0.7 09/08/2022   ALKPHOS 48 09/08/2022   AST 33 09/08/2022   ALT 17 09/08/2022   ANIONGAP 15 09/08/2022   GFR 49  Hopes to improve off nsaid   Patient Active Problem List   Diagnosis Date Noted   Pedal edema 11/12/2022   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   Degenerative disc disease, cervical 03/03/2022    Muscle cramps 02/28/2022   Neck pain 02/28/2022   Syncope 12/18/2021   Strain of flexor tendon of wrist 12/02/2021   Tendinitis of left wrist 12/02/2021   Left wrist pain 11/13/2021   Family history of cancer 08/27/2020   History of breast cancer 08/27/2020   IDA (iron deficiency anemia) 08/27/2020   B12 deficiency 08/27/2020   Chronic kidney disease (CKD) stage G3b/A1, moderately decreased glomerular filtration rate (GFR) between 30-44 mL/min/1.73 square meter and albuminuria creatinine ratio less than 30 mg/g (HCC) 07/24/2020   Tachycardia 01/11/2019   Fatty liver 06/21/2018   Obesity (BMI 30-39.9) 06/21/2018   Dyspepsia 05/25/2018   GERD (gastroesophageal reflux disease) 05/25/2018   H/O vertigo 05/11/2018   Essential (hemorrhagic) thrombocythemia (HCC) 12/25/2017   Tear of medial meniscus of knee 12/21/2017   Pain in left knee 12/21/2017   Left knee pain 09/21/2017   Welcome to Medicare preventive visit 12/17/2016   Grief reaction 12/17/2016   Estrogen deficiency 09/08/2016   Myalgia 02/08/2016   Normocytic anemia 02/09/2013   Abnormal EKG 07/27/2012   Special screening for malignant neoplasms, colon 04/29/2011   Gynecological examination 04/29/2011   Routine general medical examination at a health care facility  04/20/2011   OSTEOARTHRITIS, HANDS, BILATERAL 10/31/2008   Diabetes type 2, controlled (HCC) 07/10/2008   Hyperlipidemia associated with type 2 diabetes mellitus (HCC) 01/26/2008   Essential hypertension 01/26/2008   ALLERGIC RHINITIS 12/07/2007   Past Medical History:  Diagnosis Date   Allergic rhinitis    Breast cancer (HCC) 1996   right breast   DM2 (diabetes mellitus, type 2) (HCC)    GERD (gastroesophageal reflux disease)    Hx of cardiovascular stress test    a. Lex MV 2/14:  EF 69%, no ischemia   Hyperlipidemia    Hypertension    IDA (iron deficiency anemia) 08/27/2020   Iron deficiency anemia    Obesity    Osteoarthritis    hands   Ulcer     Past Surgical History:  Procedure Laterality Date   APPENDECTOMY  1976   BREAST LUMPECTOMY Right 1996   right breast, lumpectomy w/nodes   CATARACT EXTRACTION W/PHACO Left 11/28/2020   Procedure: CATARACT EXTRACTION PHACO AND INTRAOCULAR LENS PLACEMENT (IOC) LEFT DIABETIC;  Surgeon: Lockie Mola, MD;  Location: [facility];  Service: Ophthalmology;  Laterality: Left;  5.02 01:07.4 7.4%   CATARACT EXTRACTION W/PHACO Right 12/12/2020   Procedure: CATARACT EXTRACTION PHACO AND INTRAOCULAR LENS PLACEMENT (IOC) RIGHT DIABETIC 5.22 00:59.2;  Surgeon: Lockie Mola, MD;  Location: [facility];  Service: Ophthalmology;  Laterality: Right;   CHONDROPLASTY Left 02/10/2018   Procedure: CHONDROPLASTY;  Surgeon: Lyndle Herrlich, MD;  Location: [facility];  Service: Orthopedics;  Laterality: Left;   GASTRIC RESTRICTION SURGERY     for reflux   HYSTEROSCOPY WITH D & C N/A 10/18/2012   Procedure: DILATATION AND CURETTAGE /HYSTEROSCOPY;  Surgeon: Catalina Antigua, MD;  Location: [facility];  Service: Gynecology;  Laterality: N/A;   KNEE ARTHROSCOPY WITH MEDIAL MENISECTOMY Left 02/10/2018   Procedure: KNEE ARTHROSCOPY WITH MEDIAL and LATERAL MENISECTOMY;  Surgeon: Lyndle Herrlich, MD;  Location: [facility];  Service: Orthopedics;  Laterality: Left;   POLYPECTOMY N/A 10/18/2012   Procedure: POLYPECTOMY;  Surgeon: Catalina Antigua, MD;  Location: [facility];  Service: Gynecology;  Laterality: N/A;   SYNOVECTOMY Left 02/10/2018   Procedure: SYNOVECTOMY;  Surgeon: Lyndle Herrlich, MD;  Location: [facility];  Service: Orthopedics;  Laterality: Left;   Social History   Tobacco Use   Smoking status: Never   Smokeless tobacco: Never  Vaping Use   Vaping Use: Never used  Substance Use Topics   Alcohol use: Never    Alcohol/week: 0.0 standard drinks of alcohol   Drug use: No   Family History  Problem Relation Age of Onset   Diabetes Mother    Heart disease Mother        Pacemaker, CHF    Hypertension Mother    Kidney cancer Mother    Stroke Father    CAD Father 82       Died with MI   Esophageal cancer Brother    Heart disease Sister    Colon cancer Neg Hx    Rectal cancer Neg Hx    Stomach cancer Neg Hx    Pancreatic cancer Neg Hx    Allergies  Allergen Reactions   Cholestyramine Other (See Comments)    REACTION: Muscles tightened up, drew up.   Simvastatin Other (See Comments)    Muscular pain   Lipitor [Atorvastatin] Other (See Comments)    Muscle cramping   Niacin Nausea And Vomiting   Current Outpatient Medications on File Prior to Visit  Medication Sig Dispense  Refill   Accu-Chek Softclix Lancets lancets TEST BLOOD SUGAR EVERY DAY FOR DIABETES 100 each 1   acetaminophen (TYLENOL) 500 MG tablet Take 1,000 mg by mouth every 6 (six) hours as needed for mild pain.      Alcohol Swabs (DROPSAFE ALCOHOL PREP) 70 % PADS USE ONE TIME DAILY AS DIRECTED WHEN CHECKING BLOOD SUGAR 100 each 1   amitriptyline (ELAVIL) 25 MG tablet TAKE 1 TABLET AT BEDTIME 90 tablet 1   aspirin 81 MG tablet Take 81 mg by mouth at bedtime.      Bempedoic Acid (NEXLETOL) 180 MG TABS Take 1 tablet by mouth daily.     Blood Glucose Calibration (ACCU-CHEK AVIVA) SOLN USE TO CALIBRATE METER AS DIRECTED 1 each 2   Blood Glucose Monitoring Suppl (ACCU-CHEK AVIVA PLUS) w/Device KIT Use to check blood sugar once daily for DM (dx. E11.9) 1 kit 0   calcium elemental as carbonate (BARIATRIC TUMS ULTRA) 400 MG chewable tablet Chew 1,000 mg by mouth daily as needed for heartburn.     Camphor-Menthol-Methyl Sal (SALONPAS) 3.07-19-08 % PTCH Apply topically.     Cholecalciferol (VITAMIN D) 2000 units tablet Take 2,000 Units by mouth daily.     Docusate Sodium (DSS) 100 MG CAPS Take 1 capsule by mouth 2 (two) times daily as needed.     ezetimibe (ZETIA) 10 MG tablet Take 1 tablet (10 mg total) by mouth daily. 30 tablet 3   fenofibrate (TRICOR) 145 MG tablet TAKE 1 TABLET EVERY DAY 90 tablet 1   gabapentin  (NEURONTIN) 300 MG capsule 300 mg 3 (three) times daily as needed.     glipiZIDE (GLUCOTROL XL) 5 MG 24 hr tablet TAKE 1 TABLET EVERY DAY WITH BREAKFAST 90 tablet 1   glucose blood (ACCU-CHEK AVIVA PLUS) test strip TEST BLOOD SUGAR EVERY DAY FOR DIABETES 100 strip 1   loratadine (CLARITIN) 10 MG tablet Take 10 mg by mouth daily as needed for allergies.     losartan (COZAAR) 100 MG tablet Take 1 tablet (100 mg total) by mouth daily. 90 tablet 3   meclizine (ANTIVERT) 25 MG tablet Take 1 tablet (25 mg total) by mouth 3 (three) times daily as needed for dizziness. 90 tablet 3   metFORMIN (GLUCOPHAGE) 1000 MG tablet TAKE 1/2 TABLET TWICE DAILY WITH MEALS 90 tablet 1   metoprolol succinate (TOPROL-XL) 50 MG 24 hr tablet Take 1 tablet (50 mg total) by mouth daily. Take with or immediately following a meal. 90 tablet 3   omeprazole (PRILOSEC) 40 MG capsule TAKE 1 CAPSULE IN THE MORNING AND AT BEDTIME. 180 capsule 3   ondansetron (ZOFRAN) 4 MG tablet TAKE 1 TABLET EVERY 4 HOURS AS NEEDED FOR NAUSEA AND VOMITING 90 tablet 0   SALINE NASAL SPRAY NA Place 1 spray into the nose daily as needed (congestion).      No current facility-administered medications on file prior to visit.    Review of Systems  Constitutional:  Negative for activity change, appetite change, fatigue, fever and unexpected weight change.  HENT:  Negative for congestion, ear pain, rhinorrhea, sinus pressure and sore throat.   Eyes:  Negative for pain, redness and visual disturbance.  Respiratory:  Negative for cough, shortness of breath and wheezing.   Cardiovascular:  Negative for chest pain and palpitations.       Improved leg swelling  Gastrointestinal:  Negative for abdominal pain, blood in stool, constipation and diarrhea.  Endocrine: Negative for polydipsia and polyuria.  Genitourinary:  Negative for  dysuria, frequency and urgency.  Musculoskeletal:  Positive for arthralgias, back pain and neck pain. Negative for myalgias.   Skin:  Negative for pallor and rash.  Allergic/Immunologic: Negative for environmental allergies.  Neurological:  Negative for dizziness, syncope and headaches.  Hematological:  Negative for adenopathy. Does not bruise/bleed easily.  Psychiatric/Behavioral:  Negative for decreased concentration and dysphoric mood. The patient is not nervous/anxious.        Objective:   Physical Exam Constitutional:      General: She is not in acute distress.    Appearance: Normal appearance. She is well-developed. She is obese. She is not ill-appearing or diaphoretic.  HENT:     Head: Normocephalic and atraumatic.  Eyes:     Conjunctiva/sclera: Conjunctivae normal.     Pupils: Pupils are equal, round, and reactive to light.  Neck:     Thyroid: No thyromegaly.     Vascular: No carotid bruit or JVD.  Cardiovascular:     Rate and Rhythm: Regular rhythm. Tachycardia present.     Heart sounds: Normal heart sounds.     No gallop.  Pulmonary:     Effort: Pulmonary effort is normal. No respiratory distress.     Breath sounds: Normal breath sounds. No stridor. No wheezing, rhonchi or rales.  Abdominal:     General: There is no distension or abdominal bruit.     Palpations: Abdomen is soft.  Musculoskeletal:     Cervical back: Normal range of motion and neck supple.     Right lower leg: No edema.     Left lower leg: No edema.  Lymphadenopathy:     Cervical: No cervical adenopathy.  Skin:    General: Skin is warm and dry.     Coloration: Skin is not pale.     Findings: No rash.  Neurological:     Mental Status: She is alert.     Coordination: Coordination normal.     Deep Tendon Reflexes: Reflexes are normal and symmetric. Reflexes normal.  Psychiatric:        Mood and Affect: Mood normal.           Assessment & Plan:   Problem List Items Addressed This Visit       Cardiovascular and Mediastinum   Essential hypertension    bp in fair control at this time  Improved off of the  celebres BP Readings from Last 1 Encounters:  11/26/22 130/70  No changes needed Most recent labs reviewed  Disc lifstyle change with low sodium diet and exercise  Continues Losartan 100 mg daily  Metoprolol xl 50 mg dialy           Musculoskeletal and Integument   Degenerative disc disease, cervical    Takes gabapentin 300 mg tid prn from orthopedics Tolerating well  Is helping         Genitourinary   Chronic kidney disease (CKD) stage G3b/A1, moderately decreased glomerular filtration rate (GFR) between 30-44 mL/min/1.73 square meter and albuminuria creatinine ratio less than 30 mg/g (HCC)    Last GFR was 49 Expect that this will be improved next time since she is off celebrex/nsaid        Other   Pedal edema - Primary    Much improved off celebrex (taking gabapentin instead which can cause some swelling but seems to be less)  Also wears supp hose during the day  Bp is improved also   Instructed to continue avoiding nsaids , using hose, avoiding sodium and processed  foods Wt loss would help also

## 2022-11-26 NOTE — Assessment & Plan Note (Signed)
Takes gabapentin 300 mg tid prn from orthopedics Tolerating well  Is helping

## 2022-11-26 NOTE — Patient Instructions (Signed)
Glad your swelling is better  Stay off the celebrex Continue stockings if they help   Continue good fluid intake Avoid excess sodium / especially processed foods   Stay as active as you can be as well    Please call if swelling gets worse  Hot days may be worse

## 2022-11-26 NOTE — Assessment & Plan Note (Signed)
Much improved off celebrex (taking gabapentin instead which can cause some swelling but seems to be less)  Also wears supp hose during the day  Bp is improved also   Instructed to continue avoiding nsaids , using hose, avoiding sodium and processed foods Wt loss would help also

## 2022-11-26 NOTE — Assessment & Plan Note (Signed)
Last GFR was 49 Expect that this will be improved next time since she is off celebrex/nsaid

## 2022-11-26 NOTE — Assessment & Plan Note (Signed)
bp in fair control at this time  Improved off of the celebres BP Readings from Last 1 Encounters:  11/26/22 130/70   No changes needed Most recent labs reviewed  Disc lifstyle change with low sodium diet and exercise  Continues Losartan 100 mg daily  Metoprolol xl 50 mg dialy

## 2022-11-28 ENCOUNTER — Ambulatory Visit: Payer: Medicare PPO | Admitting: Podiatry

## 2022-11-28 DIAGNOSIS — M67471 Ganglion, right ankle and foot: Secondary | ICD-10-CM

## 2022-11-28 DIAGNOSIS — M7752 Other enthesopathy of left foot: Secondary | ICD-10-CM | POA: Diagnosis not present

## 2022-11-28 MED ORDER — BETAMETHASONE SOD PHOS & ACET 6 (3-3) MG/ML IJ SUSP
3.0000 mg | Freq: Once | INTRAMUSCULAR | Status: AC
Start: 2022-11-28 — End: 2022-11-28
  Administered 2022-11-28: 3 mg via INTRA_ARTICULAR

## 2022-11-28 NOTE — Progress Notes (Signed)
Chief Complaint  Patient presents with   Routine Post Op    Patient came in today for routine post op, patient denies any pain, NO N/V/F/C/SOB, A1c- 5.7 BG- 134 , patient states she is doing much better     Subjective:  Patient presents today status post excision of os peroneum left foot.  DOS: 08/07/2022.  Patient is doing very well to her left surgical extremity.  She is full activity with no restrictions.  She is very satisfied with the outcome of the surgery and has no pain  Over the past month the patient has developed a lump to the dorsum of the right foot that she says is symptomatic especially in shoes.  She tries adjusting her shoes but she continues to have some pain.  Denies a history of injury.  Idiopathic onset.  She has not done anything for treatment.  Past Medical History:  Diagnosis Date   Allergic rhinitis    Breast cancer (HCC) 1996   right breast   DM2 (diabetes mellitus, type 2) (HCC)    GERD (gastroesophageal reflux disease)    Hx of cardiovascular stress test    a. Lex MV 2/14:  EF 69%, no ischemia   Hyperlipidemia    Hypertension    IDA (iron deficiency anemia) 08/27/2020   Iron deficiency anemia    Obesity    Osteoarthritis    hands   Ulcer     Past Surgical History:  Procedure Laterality Date   APPENDECTOMY  1976   BREAST LUMPECTOMY Right 1996   right breast, lumpectomy w/nodes   CATARACT EXTRACTION W/PHACO Left 11/28/2020   Procedure: CATARACT EXTRACTION PHACO AND INTRAOCULAR LENS PLACEMENT (IOC) LEFT DIABETIC;  Surgeon: Lockie Mola, MD;  Location: [facility];  Service: Ophthalmology;  Laterality: Left;  5.02 01:07.4 7.4%   CATARACT EXTRACTION W/PHACO Right 12/12/2020   Procedure: CATARACT EXTRACTION PHACO AND INTRAOCULAR LENS PLACEMENT (IOC) RIGHT DIABETIC 5.22 00:59.2;  Surgeon: Lockie Mola, MD;  Location: [facility];  Service: Ophthalmology;  Laterality: Right;   CHONDROPLASTY Left 02/10/2018   Procedure:  CHONDROPLASTY;  Surgeon: Lyndle Herrlich, MD;  Location: [facility];  Service: Orthopedics;  Laterality: Left;   GASTRIC RESTRICTION SURGERY     for reflux   HYSTEROSCOPY WITH D & C N/A 10/18/2012   Procedure: DILATATION AND CURETTAGE /HYSTEROSCOPY;  Surgeon: Catalina Antigua, MD;  Location: [facility];  Service: Gynecology;  Laterality: N/A;   KNEE ARTHROSCOPY WITH MEDIAL MENISECTOMY Left 02/10/2018   Procedure: KNEE ARTHROSCOPY WITH MEDIAL and LATERAL MENISECTOMY;  Surgeon: Lyndle Herrlich, MD;  Location: [facility];  Service: Orthopedics;  Laterality: Left;   POLYPECTOMY N/A 10/18/2012   Procedure: POLYPECTOMY;  Surgeon: Catalina Antigua, MD;  Location: [facility];  Service: Gynecology;  Laterality: N/A;   SYNOVECTOMY Left 02/10/2018   Procedure: SYNOVECTOMY;  Surgeon: Lyndle Herrlich, MD;  Location: [facility];  Service: Orthopedics;  Laterality: Left;    Allergies  Allergen Reactions   Cholestyramine Other (See Comments)    REACTION: Muscles tightened up, drew up.   Simvastatin Other (See Comments)    Muscular pain   Lipitor [Atorvastatin] Other (See Comments)    Muscle cramping   Niacin Nausea And Vomiting    Objective/Physical Exam Neurovascular status intact.  Incision healed.  No tenderness or pain with palpation throughout the lateral aspect of the foot and surgical site.  Muscle strength 5/5 all compartments.  Patient is able to dorsiflex the ankle and evert the foot well.  There is some tenderness to palpation with a fluctuant lesion over the dorsum of the right foot around the area of the tibialis anterior tendon.  There is some associated tenderness with palpation as well.  It is fluctuant and likely consistent with a ganglion cyst  Radiographic exam LT foot 11/28/2022 Essentially unchanged.  Absence of the os peroneum noted to the left foot.  No acute fractures.  Otherwise normal exam.   Assessment: 1. s/p excision of os peroneum left foot. DOS: 08/07/2022 -In regards to the surgery she may  resume full activity no restrictions -Recommend good supportive shoes and sneakers  2.  Ganglion cyst right dorsal foot -Injection of 0.5 cc Celestone Soluspan injected along the tibialis anterior tendon sheath/ganglion cyst of the right foot. -Recommend loosefitting shoes that do not irritate this area -Will continue to observe for now  Felecia Shelling, DPM [facility]  Dr. Felecia Shelling, DPM    [address] [place], [place] [address]                Office [phone]  Fax [phone]

## 2022-12-09 ENCOUNTER — Inpatient Hospital Stay: Payer: Medicare PPO | Attending: Internal Medicine

## 2022-12-09 DIAGNOSIS — D508 Other iron deficiency anemias: Secondary | ICD-10-CM

## 2022-12-09 DIAGNOSIS — E538 Deficiency of other specified B group vitamins: Secondary | ICD-10-CM | POA: Diagnosis not present

## 2022-12-09 MED ORDER — CYANOCOBALAMIN 1000 MCG/ML IJ SOLN
1000.0000 ug | Freq: Once | INTRAMUSCULAR | Status: AC
  Administered 2022-12-09: 1000 ug via INTRAMUSCULAR
  Filled 2022-12-09: qty 1

## 2022-12-10 ENCOUNTER — Other Ambulatory Visit: Payer: Self-pay | Admitting: Family Medicine

## 2022-12-10 DIAGNOSIS — R000 Tachycardia, unspecified: Secondary | ICD-10-CM

## 2022-12-10 DIAGNOSIS — I10 Essential (primary) hypertension: Secondary | ICD-10-CM

## 2022-12-12 ENCOUNTER — Encounter: Payer: Self-pay | Admitting: Cardiology

## 2022-12-12 ENCOUNTER — Ambulatory Visit: Payer: Medicare PPO | Attending: Cardiology | Admitting: Cardiology

## 2022-12-12 VITALS — BP 136/78 | HR 105 | Ht 59.0 in | Wt 175.6 lb

## 2022-12-12 DIAGNOSIS — R000 Tachycardia, unspecified: Secondary | ICD-10-CM | POA: Diagnosis not present

## 2022-12-12 DIAGNOSIS — E781 Pure hyperglyceridemia: Secondary | ICD-10-CM | POA: Diagnosis not present

## 2022-12-12 DIAGNOSIS — Z6835 Body mass index (BMI) 35.0-35.9, adult: Secondary | ICD-10-CM

## 2022-12-12 DIAGNOSIS — I10 Essential (primary) hypertension: Secondary | ICD-10-CM | POA: Diagnosis not present

## 2022-12-12 DIAGNOSIS — R0683 Snoring: Secondary | ICD-10-CM

## 2022-12-12 MED ORDER — METOPROLOL SUCCINATE ER 50 MG PO TB24: ORAL_TABLET | ORAL | 3 refills | Status: DC

## 2022-12-12 MED ORDER — METOPROLOL SUCCINATE ER 50 MG PO TB24: ORAL_TABLET | ORAL | 1 refills | Status: DC

## 2022-12-12 NOTE — Progress Notes (Signed)
Cardiology Office Note:    Date:  12/12/2022   ID:  Maralynn, Peaches July 03, 1951, MRN [id number]  PCP:  [name], MD   [facility] Providers Cardiologist:  Debbe Odea, MD     Referring MD: [name], MD   Chief Complaint  Patient presents with   Follow-up    3 month f/u.  New dyspnea on exertion.  Has been feeling palpitations lately, HR 113 in office today.    History of Present Illness:    Martha Hamilton is a 72 y.o. female with a hx of hypertension, hyperlipidemia, diabetes, CKD 3 presenting for follow-up.   States having occasional fast heartbeats, shortness of breath usually when she goes out in the heat.  Has shortness of breath sometimes when she showers.  Denies chest pain.  Endorses snoring, daytime fatigue, feels sleepy sometimes during the day.  Prior notes Echocardiogram 02/11/2022 EF 60 to 65% Cardiac monitor 02/05/2022 normal, no arrhythmias to suggest etiology of syncope.   Past Medical History:  Diagnosis Date   Allergic rhinitis    Breast cancer (HCC) 1996   right breast   DM2 (diabetes mellitus, type 2) (HCC)    GERD (gastroesophageal reflux disease)    Hx of cardiovascular stress test    a. Lex MV 2/14:  EF 69%, no ischemia   Hyperlipidemia    Hypertension    IDA (iron deficiency anemia) 08/27/2020   Iron deficiency anemia    Obesity    Osteoarthritis    hands   Ulcer     Past Surgical History:  Procedure Laterality Date   APPENDECTOMY  1976   BREAST LUMPECTOMY Right 1996   right breast, lumpectomy w/nodes   CATARACT EXTRACTION W/PHACO Left 11/28/2020   Procedure: CATARACT EXTRACTION PHACO AND INTRAOCULAR LENS PLACEMENT (IOC) LEFT DIABETIC;  Surgeon: Lockie Mola, MD;  Location: [facility];  Service: Ophthalmology;  Laterality: Left;  5.02 01:07.4 7.4%   CATARACT EXTRACTION W/PHACO Right 12/12/2020   Procedure: CATARACT EXTRACTION PHACO AND INTRAOCULAR LENS PLACEMENT (IOC) RIGHT DIABETIC 5.22  00:59.2;  Surgeon: Lockie Mola, MD;  Location: [facility];  Service: Ophthalmology;  Laterality: Right;   CHONDROPLASTY Left 02/10/2018   Procedure: CHONDROPLASTY;  Surgeon: Lyndle Herrlich, MD;  Location: [facility];  Service: Orthopedics;  Laterality: Left;   FOOT SURGERY Left 07/2022   bone removal   GASTRIC RESTRICTION SURGERY     for reflux   HYSTEROSCOPY WITH D & C N/A 10/18/2012   Procedure: DILATATION AND CURETTAGE /HYSTEROSCOPY;  Surgeon: Catalina Antigua, MD;  Location: [facility];  Service: Gynecology;  Laterality: N/A;   KNEE ARTHROSCOPY WITH MEDIAL MENISECTOMY Left 02/10/2018   Procedure: KNEE ARTHROSCOPY WITH MEDIAL and LATERAL MENISECTOMY;  Surgeon: Lyndle Herrlich, MD;  Location: [facility];  Service: Orthopedics;  Laterality: Left;   POLYPECTOMY N/A 10/18/2012   Procedure: POLYPECTOMY;  Surgeon: Catalina Antigua, MD;  Location: [facility];  Service: Gynecology;  Laterality: N/A;   SYNOVECTOMY Left 02/10/2018   Procedure: SYNOVECTOMY;  Surgeon: Lyndle Herrlich, MD;  Location: [facility];  Service: Orthopedics;  Laterality: Left;    Current Medications: Current Meds  Medication Sig   Accu-Chek Softclix Lancets lancets TEST BLOOD SUGAR EVERY DAY FOR DIABETES   acetaminophen (TYLENOL) 500 MG tablet Take 1,000 mg by mouth every 6 (six) hours as needed for mild pain.    Alcohol Swabs (DROPSAFE ALCOHOL PREP) 70 % PADS USE ONE TIME DAILY AS DIRECTED WHEN CHECKING BLOOD SUGAR  amitriptyline (ELAVIL) 25 MG tablet TAKE 1 TABLET AT BEDTIME   aspirin 81 MG tablet Take 81 mg by mouth at bedtime.    Bempedoic Acid (NEXLETOL) 180 MG TABS Take 1 tablet by mouth daily.   Blood Glucose Calibration (ACCU-CHEK AVIVA) SOLN USE TO CALIBRATE METER AS DIRECTED   Blood Glucose Monitoring Suppl (ACCU-CHEK AVIVA PLUS) w/Device KIT Use to check blood sugar once daily for DM (dx. E11.9)   calcium elemental as carbonate (BARIATRIC TUMS ULTRA) 400 MG chewable tablet Chew 1,000 mg by mouth daily as  needed for heartburn.   Camphor-Menthol-Methyl Sal (SALONPAS) 3.07-19-08 % PTCH Apply topically.   Cholecalciferol (VITAMIN D) 2000 units tablet Take 2,000 Units by mouth daily.   Docusate Sodium (DSS) 100 MG CAPS Take 1 capsule by mouth 2 (two) times daily as needed.   ezetimibe (ZETIA) 10 MG tablet Take 1 tablet (10 mg total) by mouth daily.   fenofibrate (TRICOR) 145 MG tablet TAKE 1 TABLET EVERY DAY   gabapentin (NEURONTIN) 300 MG capsule 300 mg 3 (three) times daily as needed.   glipiZIDE (GLUCOTROL XL) 5 MG 24 hr tablet TAKE 1 TABLET EVERY DAY WITH BREAKFAST   glucose blood (ACCU-CHEK AVIVA PLUS) test strip TEST BLOOD SUGAR EVERY DAY FOR DIABETES   loratadine (CLARITIN) 10 MG tablet Take 10 mg by mouth daily as needed for allergies.   losartan (COZAAR) 100 MG tablet Take 1 tablet (100 mg total) by mouth daily.   meclizine (ANTIVERT) 25 MG tablet Take 1 tablet (25 mg total) by mouth 3 (three) times daily as needed for dizziness.   metFORMIN (GLUCOPHAGE) 1000 MG tablet TAKE 1/2 TABLET TWICE DAILY WITH MEALS   omeprazole (PRILOSEC) 40 MG capsule TAKE 1 CAPSULE IN THE MORNING AND AT BEDTIME.   ondansetron (ZOFRAN) 4 MG tablet TAKE 1 TABLET EVERY 4 HOURS AS NEEDED FOR NAUSEA AND VOMITING   SALINE NASAL SPRAY NA Place 1 spray into the nose daily as needed (congestion).    [DISCONTINUED] metoprolol succinate (TOPROL-XL) 50 MG 24 hr tablet TAKE 1 TABLET DAILY WITH OR IMMEDIATELY FOLLOWING A MEAL.     Allergies:   Cholestyramine, Simvastatin, Lipitor [atorvastatin], and Niacin   Social History   Socioeconomic History   Marital status: Single    Spouse name: Not on file   Number of children: 0   Years of education: Not on file   Highest education level: Associate degree: occupational, Scientist, product/process development, or vocational program  Occupational History   Occupation: retired    Associate Professor: RETIRED  Tobacco Use   Smoking status: Never   Smokeless tobacco: Never  Vaping Use   Vaping Use: Never used   Substance and Sexual Activity   Alcohol use: Never    Alcohol/week: 0.0 standard drinks of alcohol   Drug use: No   Sexual activity: Not Currently    Birth control/protection: Post-menopausal  Other Topics Concern   Not on file  Social History Narrative   Lives with, and cares for her elderly mother.   Social Determinants of Health   Financial Resource Strain: Low Risk  (11/10/2022)   Overall Financial Resource Strain (CARDIA)    Difficulty of Paying Living Expenses: Not hard at all  Food Insecurity: No Food Insecurity (11/10/2022)   Hunger Vital Sign    Worried About Running Out of Food in the Last Year: Never true    Ran Out of Food in the Last Year: Never true  Transportation Needs: No Transportation Needs (11/10/2022)   PRAPARE - Transportation  Lack of Transportation (Medical): No    Lack of Transportation (Non-Medical): No  Physical Activity: Inactive (11/10/2022)   Exercise Vital Sign    Days of Exercise per Week: 0 days    Minutes of Exercise per Session: 20 min  Stress: No Stress Concern Present (11/10/2022)   [employer] of Occupational Health - Occupational Stress Questionnaire    Feeling of Stress : Only a little  Social Connections: Moderately Integrated (11/10/2022)   Social Connection and Isolation Panel [NHANES]    Frequency of Communication with Friends and Family: More than three times a week    Frequency of Social Gatherings with Friends and Family: More than three times a week    Attends Religious Services: More than 4 times per year    Active Member of [employer] or Organizations: Yes    Attends Engineer, structural: More than 4 times per year    Marital Status: Never married     Family History: The patient's family history includes CAD (age of onset: 22) in her father; Diabetes in her mother; Esophageal cancer in her brother; Heart disease in her mother and sister; Hypertension in her mother; Kidney cancer in her mother; Stroke in her father.  There is no history of Colon cancer, Rectal cancer, Stomach cancer, or Pancreatic cancer.  ROS:   Please see the history of present illness.     All other systems reviewed and are negative.  EKGs/Labs/Other Studies Reviewed:    The following studies were reviewed today:   EKG:  EKG is  ordered today.  The ekg ordered today demonstrates sinus tachycardia, heart rate 105  Recent Labs: 02/28/2022: Magnesium 1.7 09/08/2022: ALT 17; BUN 21; Creatinine, Ser 1.18; Hemoglobin 11.1; Platelets 351; Potassium 4.4; Sodium 139  Recent Lipid Panel    Component Value Date/Time   CHOL 140 05/29/2022 0742   TRIG 389 (H) 05/29/2022 0742   HDL 31 (L) 05/29/2022 0742   CHOLHDL 4.5 05/29/2022 0742   VLDL 78 (H) 05/29/2022 0742   LDLCALC 31 05/29/2022 0742   LDLDIRECT 89.0 12/11/2021 0754     Risk Assessment/Calculations:         Physical Exam:    VS:  BP 136/78 (BP Location: Left Arm, Patient Position: Sitting, Cuff Size: Large)   Pulse (!) 105   Ht 4\' 11"  (1.499 m)   Wt 175 lb 9.6 oz (79.7 kg)   LMP 06/13/2001   SpO2 98%   BMI 35.47 kg/m     Wt Readings from Last 3 Encounters:  12/12/22 175 lb 9.6 oz (79.7 kg)  11/26/22 176 lb 4 oz (79.9 kg)  11/12/22 172 lb 4 oz (78.1 kg)     GEN:  Well nourished, well developed in no acute distress HEENT: Normal NECK: No JVD; No carotid bruits CARDIAC: RRR, no murmurs, rubs, gallops RESPIRATORY:  Clear to auscultation without rales, wheezing or rhonchi  ABDOMEN: Soft, non-tender, non-distended MUSCULOSKELETAL:  No edema; No deformity  SKIN: Warm and dry NEUROLOGIC:  Alert and oriented x 3 PSYCHIATRIC:  Normal affect   ASSESSMENT:    1. Primary hypertension   2. High triglycerides   3. BMI 35.0-35.9,adult   4. Snoring   5. Tachycardia   6. Essential hypertension      PLAN:    In order of problems listed above:  Hypertension, BP improved, controlled, not tachycardic.  Increase Toprol-XL to 50 mg twice daily, continue losartan 100  mg daily.   Elevated triglycerides,  Continue Zetia, fenofibrate. Obesity, low-calorie  diet, weight loss advised.  Obesity and possible sleep apnea likely contributing to shortness of breath. Snoring, daytime fatigue, somnolence.  Refer to sleep specialist for OSA eval.  Follow-up in 3 months.     Medication Adjustments/Labs and Tests Ordered: Current medicines are reviewed at length with the patient today.  Concerns regarding medicines are outlined above.  Orders Placed This Encounter  Procedures   Ambulatory referral to Pulmonology   EKG 12-Lead   Meds ordered this encounter  Medications   DISCONTD: metoprolol succinate (TOPROL-XL) 50 MG 24 hr tablet    Sig: Take 1 tablet (50 mg) by mouth TWICE daily. Take with or immediately following a meal.    Dispense:  180 tablet    Refill:  1   metoprolol succinate (TOPROL-XL) 50 MG 24 hr tablet    Sig: Take 1 tablet (50 mg) by mouth TWICE daily. Take with or immediately following a meal.    Dispense:  180 tablet    Refill:  3    Patient Instructions  Medication Instructions:  - Your physician has recommended you make the following change in your medication:   1) INCREASE Toprol XL (metoprolol succinate)  50 mg: - take 1 tablet by mouth twice daily   *If you need a refill on your cardiac medications before your next appointment, please call your pharmacy*   Lab Work: - none ordered  If you have labs (blood work) drawn today and your tests are completely normal, you will receive your results only by: MyChart Message (if you have MyChart) OR A paper copy in the mail If you have any lab test that is abnormal or we need to change your treatment, we will call you to review the results.   Testing/Procedures: 1) You have been referred to : [facility] for evaluation of sleep apnea. You will be contacted directly by the Pulmonary office with an appointment. If you do not hear from them after a weeks time, please call them  directly at [phone] to follow up.     Follow-Up: At [facility], you and your health needs are our priority.  As part of our continuing mission to provide you with exceptional heart care, we have created designated Provider Care Teams.  These Care Teams include your primary Cardiologist (physician) and Advanced Practice Providers (APPs -  Physician Assistants and Nurse Practitioners) who all work together to provide you with the care you need, when you need it.  We recommend signing up for the patient portal called "MyChart".  Sign up information is provided on this After Visit Summary.  MyChart is used to connect with patients for Virtual Visits (Telemedicine).  Patients are able to view lab/test results, encounter notes, upcoming appointments, etc.  Non-urgent messages can be sent to your provider as well.   To learn more about what you can do with MyChart, go to [email or web address].    Your next appointment:   3 month(s)  Provider:   You may see Debbe Odea, MD or one of the following Advanced Practice Providers on your designated Care Team:   Nicolasa Ducking, NP Eula Listen, PA-C Cadence Fransico Michael, PA-C Charlsie Quest, NP    Other Instructions N/a    Signed, Debbe Odea, MD  12/12/2022 12:38 PM    [facility]

## 2022-12-12 NOTE — Patient Instructions (Addendum)
Medication Instructions:  - Your physician has recommended you make the following change in your medication:   1) INCREASE Toprol XL (metoprolol succinate)  50 mg: - take 1 tablet by mouth twice daily   *If you need a refill on your cardiac medications before your next appointment, please call your pharmacy*   Lab Work: - none ordered  If you have labs (blood work) drawn today and your tests are completely normal, you will receive your results only by: MyChart Message (if you have MyChart) OR A paper copy in the mail If you have any lab test that is abnormal or we need to change your treatment, we will call you to review the results.   Testing/Procedures: 1) You have been referred to : [facility] for evaluation of sleep apnea. You will be contacted directly by the Pulmonary office with an appointment. If you do not hear from them after a weeks time, please call them directly at [phone] to follow up.     Follow-Up: At [facility], you and your health needs are our priority.  As part of our continuing mission to provide you with exceptional heart care, we have created designated Provider Care Teams.  These Care Teams include your primary Cardiologist (physician) and Advanced Practice Providers (APPs -  Physician Assistants and Nurse Practitioners) who all work together to provide you with the care you need, when you need it.  We recommend signing up for the patient portal called "MyChart".  Sign up information is provided on this After Visit Summary.  MyChart is used to connect with patients for Virtual Visits (Telemedicine).  Patients are able to view lab/test results, encounter notes, upcoming appointments, etc.  Non-urgent messages can be sent to your provider as well.   To learn more about what you can do with MyChart, go to [email or web address].    Your next appointment:   3 month(s)  Provider:   You may see Debbe Odea, MD or one of the following  Advanced Practice Providers on your designated Care Team:   Nicolasa Ducking, NP Eula Listen, PA-C Cadence Fransico Michael, PA-C Charlsie Quest, NP    Other Instructions N/a

## 2022-12-18 ENCOUNTER — Other Ambulatory Visit: Payer: Self-pay

## 2022-12-18 MED ORDER — NEXLETOL 180 MG PO TABS: 1.0000 | ORAL_TABLET | Freq: Every day | ORAL | 2 refills | Status: DC

## 2022-12-18 NOTE — Telephone Encounter (Signed)
Requested Prescriptions   Signed Prescriptions Disp Refills   Bempedoic Acid (NEXLETOL) 180 MG TABS 30 tablet 2    Sig: Take 1 tablet (180 mg total) by mouth daily.    Authorizing Provider: Debbe Odea    Ordering User: Guerry Minors

## 2023-01-09 ENCOUNTER — Inpatient Hospital Stay: Payer: Medicare PPO | Attending: Internal Medicine

## 2023-01-09 DIAGNOSIS — D508 Other iron deficiency anemias: Secondary | ICD-10-CM

## 2023-01-09 DIAGNOSIS — E538 Deficiency of other specified B group vitamins: Secondary | ICD-10-CM | POA: Insufficient documentation

## 2023-01-09 MED ORDER — CYANOCOBALAMIN 1000 MCG/ML IJ SOLN
1000.0000 ug | Freq: Once | INTRAMUSCULAR | Status: AC
  Administered 2023-01-09: 1000 ug via INTRAMUSCULAR
  Filled 2023-01-09: qty 1

## 2023-01-19 ENCOUNTER — Telehealth: Payer: Self-pay | Admitting: Family Medicine

## 2023-01-19 ENCOUNTER — Other Ambulatory Visit (INDEPENDENT_AMBULATORY_CARE_PROVIDER_SITE_OTHER): Payer: Medicare PPO

## 2023-01-19 DIAGNOSIS — I10 Essential (primary) hypertension: Secondary | ICD-10-CM

## 2023-01-19 DIAGNOSIS — D473 Essential (hemorrhagic) thrombocythemia: Secondary | ICD-10-CM | POA: Diagnosis not present

## 2023-01-19 DIAGNOSIS — E119 Type 2 diabetes mellitus without complications: Secondary | ICD-10-CM

## 2023-01-19 DIAGNOSIS — E785 Hyperlipidemia, unspecified: Secondary | ICD-10-CM

## 2023-01-19 DIAGNOSIS — E1169 Type 2 diabetes mellitus with other specified complication: Secondary | ICD-10-CM | POA: Diagnosis not present

## 2023-01-19 DIAGNOSIS — E538 Deficiency of other specified B group vitamins: Secondary | ICD-10-CM

## 2023-01-19 LAB — COMPREHENSIVE METABOLIC PANEL
ALT: 11 U/L (ref 0–35)
AST: 13 U/L (ref 0–37)
Albumin: 3.7 g/dL (ref 3.5–5.2)
Alkaline Phosphatase: 33 U/L — ABNORMAL LOW (ref 39–117)
BUN: 23 mg/dL (ref 6–23)
CO2: 26 mEq/L (ref 19–32)
Calcium: 9 mg/dL (ref 8.4–10.5)
Chloride: 102 mEq/L (ref 96–112)
Creatinine, Ser: 1.07 mg/dL (ref 0.40–1.20)
GFR: 52.09 mL/min — ABNORMAL LOW (ref >60.00)
Glucose, Bld: 176 mg/dL — ABNORMAL HIGH (ref 70–99)
Potassium: 4.4 mEq/L (ref 3.5–5.1)
Sodium: 138 mEq/L (ref 135–145)
Total Bilirubin: 0.2 mg/dL (ref 0.2–1.2)
Total Protein: 6.9 g/dL (ref 6.0–8.3)

## 2023-01-19 LAB — MICROALBUMIN / CREATININE URINE RATIO
Creatinine,U: 72.2 mg/dL
Microalb Creat Ratio: 6.8 mg/g (ref 0.0–30.0)
Microalb, Ur: 4.9 mg/dL — ABNORMAL HIGH (ref 0.0–1.9)

## 2023-01-19 LAB — CBC WITH DIFFERENTIAL/PLATELET
Basophils Absolute: 0.1 10*3/uL (ref 0.0–0.1)
Basophils Relative: 1.5 % (ref 0.0–3.0)
Eosinophils Absolute: 0.4 10*3/uL (ref 0.0–0.7)
Eosinophils Relative: 6.2 % — ABNORMAL HIGH (ref 0.0–5.0)
HCT: 31.3 % — ABNORMAL LOW (ref 36.0–46.0)
Hemoglobin: 9.8 g/dL — ABNORMAL LOW (ref 12.0–15.0)
Lymphocytes Relative: 25.2 % (ref 12.0–46.0)
Lymphs Abs: 1.7 10*3/uL (ref 0.7–4.0)
MCHC: 31.3 g/dL (ref 30.0–36.0)
MCV: 81.8 fl (ref 78.0–100.0)
Monocytes Absolute: 0.5 10*3/uL (ref 0.1–1.0)
Monocytes Relative: 7.8 % (ref 3.0–12.0)
Neutro Abs: 3.9 10*3/uL (ref 1.4–7.7)
Neutrophils Relative %: 59.3 % (ref 43.0–77.0)
Platelets: 433 10*3/uL — ABNORMAL HIGH (ref 150.0–400.0)
RBC: 3.83 Mil/uL — ABNORMAL LOW (ref 3.87–5.11)
RDW: 14.5 % (ref 11.5–15.5)
WBC: 6.6 10*3/uL (ref 4.0–10.5)

## 2023-01-19 LAB — TSH: TSH: 0.69 u[IU]/mL (ref 0.35–5.50)

## 2023-01-19 LAB — LIPID PANEL
Cholesterol: 145 mg/dL (ref 0–200)
HDL: 35 mg/dL — ABNORMAL LOW (ref >39.00)
NonHDL: 110.13
Total CHOL/HDL Ratio: 4
Triglycerides: 312 mg/dL — ABNORMAL HIGH (ref 0.0–149.0)
VLDL: 62.4 mg/dL — ABNORMAL HIGH (ref 0.0–40.0)

## 2023-01-19 LAB — VITAMIN B12: Vitamin B-12: 271 pg/mL (ref 211–911)

## 2023-01-19 LAB — HEMOGLOBIN A1C: Hgb A1c MFr Bld: 7.6 % — ABNORMAL HIGH (ref 4.6–6.5)

## 2023-01-19 LAB — LDL CHOLESTEROL, DIRECT: Direct LDL: 80 mg/dL

## 2023-01-19 NOTE — Telephone Encounter (Signed)
-----   Message from Lovena Neighbours, RT sent at 01/07/2023  4:22 PM EDT ----- Regarding: labs for 7.8.24 Patient is scheduled for CPX labs, please order future labs, Thank you-Erin

## 2023-01-21 DIAGNOSIS — M1732 Unilateral post-traumatic osteoarthritis, left knee: Secondary | ICD-10-CM | POA: Diagnosis not present

## 2023-01-21 DIAGNOSIS — M1712 Unilateral primary osteoarthritis, left knee: Secondary | ICD-10-CM | POA: Diagnosis not present

## 2023-01-22 ENCOUNTER — Ambulatory Visit (INDEPENDENT_AMBULATORY_CARE_PROVIDER_SITE_OTHER): Payer: Medicare PPO

## 2023-01-22 VITALS — Ht 59.5 in | Wt 167.0 lb

## 2023-01-22 DIAGNOSIS — Z0000 Encounter for general adult medical examination without abnormal findings: Secondary | ICD-10-CM

## 2023-01-22 DIAGNOSIS — Z1231 Encounter for screening mammogram for malignant neoplasm of breast: Secondary | ICD-10-CM

## 2023-01-22 NOTE — Patient Instructions (Signed)
Martha Hamilton , Thank you for taking time to come for your Medicare Wellness Visit. I appreciate your ongoing commitment to your health goals. Please review the following plan we discussed and let me know if I can assist you in the future.   These are the goals we discussed:  Goals      DIET - EAT MORE FRUITS AND VEGETABLES     DIET - INCREASE WATER INTAKE     Starting 01/10/2019, I will continue to drink at least 6-8 glasses of water daily.      Patient advised to follow up with AWV and Vaccine     Patient Stated     01/13/2020, I will continue to walk everyday for 1 mile.      Patient Stated     01/17/2021, I continue to walk daily for about 15-20 minutes.      Patient Stated     Start walking again.        This is a list of the screening recommended for you and due dates:  Health Maintenance  Topic Date Due   COVID-19 Vaccine (4 - 2023-24 season) 03/14/2022   Stool Blood Test  05/01/2022   Complete foot exam   09/18/2022   Medicare Annual Wellness Visit  01/21/2023   Hepatitis C Screening  03/01/2023*   Flu Shot  02/12/2023   Mammogram  07/05/2023   Hemoglobin A1C  07/22/2023   Eye exam for diabetics  10/27/2023   Yearly kidney function blood test for diabetes  01/19/2024   Yearly kidney health urinalysis for diabetes  01/19/2024   Colon Cancer Screening  05/01/2028   Pneumonia Vaccine  Completed   DEXA scan (bone density measurement)  Completed   Zoster (Shingles) Vaccine  Completed   HPV Vaccine  Aged Out   DTaP/Tdap/Td vaccine  Discontinued  *Topic was postponed. The date shown is not the original due date.   You have an order for:  []   2D Mammogram  [x]   3D Mammogram  []   Bone Density     Please call for appointment:  [facility]  [address] [place] [address] [phone]  [facility] [address] [place], [place] [address] [phone]  [place]  [facility] [address] Geanie Logan [place], [place] [address] [phone]    Make sure to wear two-piece clothing.  No lotions, powders, or deodorants the day of the appointment. Make sure to bring picture ID and insurance card.  Bring list of medications you are currently taking including any supplements.   Schedule your  screening mammogram through MyChart!   Log into your MyChart account.  Go to 'Visit' (or 'Appointments' if on mobile App) --> Schedule an Appointment  Under 'Select a Reason for Visit' choose the Mammogram Screening option.  Complete the pre-visit questions and select the time and place that best fits your schedule.    Advanced directives: on file in chart  Conditions/risks identified: Aim for 30 minutes of exercise or brisk walking, 6-8 glasses of water, and 5 servings of fruits and vegetables each day.   Next appointment: Follow up in one year for your annual wellness visit 02/01/24 @ 8:15 telephone call   Preventive Care 65 Years and Older, Female Preventive care refers to lifestyle choices and visits with your health care provider that can promote health and wellness. What does preventive care include? A yearly  physical exam. This is also called an annual well check. Dental exams once or twice a year. Routine eye exams. Ask your health care provider how often you should have your eyes checked. Personal lifestyle choices, including: Daily care of your teeth and gums. Regular physical activity. Eating a healthy diet. Avoiding tobacco and drug use. Limiting alcohol use. Practicing safe sex. Taking low-dose aspirin every day. Taking vitamin and mineral supplements as recommended by your health care provider. What happens during an annual well check? The services and screenings done by your health care provider during your annual well check will depend on your age, overall health, lifestyle risk factors, and family history of  disease. Counseling  Your health care provider may ask you questions about your: Alcohol use. Tobacco use. Drug use. Emotional well-being. Home and relationship well-being. Sexual activity. Eating habits. History of falls. Memory and ability to understand (cognition). Work and work Astronomer. Reproductive health. Screening  You may have the following tests or measurements: Height, weight, and BMI. Blood pressure. Lipid and cholesterol levels. These may be checked every 5 years, or more frequently if you are over 59 years old. Skin check. Lung cancer screening. You may have this screening every year starting at age 59 if you have a 30-pack-year history of smoking and currently smoke or have quit within the past 15 years. Fecal occult blood test (FOBT) of the stool. You may have this test every year starting at age 66. Flexible sigmoidoscopy or colonoscopy. You may have a sigmoidoscopy every 5 years or a colonoscopy every 10 years starting at age 35. Hepatitis C blood test. Hepatitis B blood test. Sexually transmitted disease (STD) testing. Diabetes screening. This is done by checking your blood sugar (glucose) after you have not eaten for a while (fasting). You may have this done every 1-3 years. Bone density scan. This is done to screen for osteoporosis. You may have this done starting at age 76. Mammogram. This may be done every 1-2 years. Talk to your health care provider about how often you should have regular mammograms. Talk with your health care provider about your test results, treatment options, and if necessary, the need for more tests. Vaccines  Your health care provider may recommend certain vaccines, such as: Influenza vaccine. This is recommended every year. Tetanus, diphtheria, and acellular pertussis (Tdap, Td) vaccine. You may need a Td booster every 10 years. Zoster vaccine. You may need this after age 3. Pneumococcal 13-valent conjugate (PCV13) vaccine. One  dose is recommended after age 46. Pneumococcal polysaccharide (PPSV23) vaccine. One dose is recommended after age 44. Talk to your health care provider about which screenings and vaccines you need and how often you need them. This information is not intended to replace advice given to you by your health care provider. Make sure you discuss any questions you have with your health care provider. Document Released: 07/27/2015 Document Revised: 03/19/2016 Document Reviewed: 05/01/2015 Elsevier Interactive Patient Education  2017 [employer].  Fall Prevention in the Home Falls can cause injuries. They can happen to people of all ages. There are many things you can do to make your home safe and to help prevent falls. What can I do on the outside of my home? Regularly fix the edges of walkways and driveways and fix any cracks. Remove anything that might make you trip as you walk through a door, such as a raised step or threshold. Trim any bushes or trees on the path to your home. Use bright outdoor  lighting. Clear any walking paths of anything that might make someone trip, such as rocks or tools. Regularly check to see if handrails are loose or broken. Make sure that both sides of any steps have handrails. Any raised decks and porches should have guardrails on the edges. Have any leaves, snow, or ice cleared regularly. Use sand or salt on walking paths during winter. Clean up any spills in your garage right away. This includes oil or grease spills. What can I do in the bathroom? Use night lights. Install grab bars by the toilet and in the tub and shower. Do not use towel bars as grab bars. Use non-skid mats or decals in the tub or shower. If you need to sit down in the shower, use a plastic, non-slip stool. Keep the floor dry. Clean up any water that spills on the floor as soon as it happens. Remove soap buildup in the tub or shower regularly. Attach bath mats securely with double-sided  non-slip rug tape. Do not have throw rugs and other things on the floor that can make you trip. What can I do in the bedroom? Use night lights. Make sure that you have a light by your bed that is easy to reach. Do not use any sheets or blankets that are too big for your bed. They should not hang down onto the floor. Have a firm chair that has side arms. You can use this for support while you get dressed. Do not have throw rugs and other things on the floor that can make you trip. What can I do in the kitchen? Clean up any spills right away. Avoid walking on wet floors. Keep items that you use a lot in easy-to-reach places. If you need to reach something above you, use a strong step stool that has a grab bar. Keep electrical cords out of the way. Do not use floor polish or wax that makes floors slippery. If you must use wax, use non-skid floor wax. Do not have throw rugs and other things on the floor that can make you trip. What can I do with my stairs? Do not leave any items on the stairs. Make sure that there are handrails on both sides of the stairs and use them. Fix handrails that are broken or loose. Make sure that handrails are as long as the stairways. Check any carpeting to make sure that it is firmly attached to the stairs. Fix any carpet that is loose or worn. Avoid having throw rugs at the top or bottom of the stairs. If you do have throw rugs, attach them to the floor with carpet tape. Make sure that you have a light switch at the top of the stairs and the bottom of the stairs. If you do not have them, ask someone to add them for you. What else can I do to help prevent falls? Wear shoes that: Do not have high heels. Have rubber bottoms. Are comfortable and fit you well. Are closed at the toe. Do not wear sandals. If you use a stepladder: Make sure that it is fully opened. Do not climb a closed stepladder. Make sure that both sides of the stepladder are locked into place. Ask  someone to hold it for you, if possible. Clearly mark and make sure that you can see: Any grab bars or handrails. First and last steps. Where the edge of each step is. Use tools that help you move around (mobility aids) if they are needed. These include:  Canes. Walkers. Scooters. Crutches. Turn on the lights when you go into a dark area. Replace any light bulbs as soon as they burn out. Set up your furniture so you have a clear path. Avoid moving your furniture around. If any of your floors are uneven, fix them. If there are any pets around you, be aware of where they are. Review your medicines with your doctor. Some medicines can make you feel dizzy. This can increase your chance of falling. Ask your doctor what other things that you can do to help prevent falls. This information is not intended to replace advice given to you by your health care provider. Make sure you discuss any questions you have with your health care provider. Document Released: 04/26/2009 Document Revised: 12/06/2015 Document Reviewed: 08/04/2014 Elsevier Interactive Patient Education  2017 [employer].

## 2023-01-22 NOTE — Progress Notes (Signed)
Subjective:   Martha Hamilton is a 72 y.o. female who presents for Medicare Annual (Subsequent) preventive examination.  Visit Complete: Virtual  I connected with  Martha Hamilton on 01/22/23 by a audio enabled telemedicine application and verified that I am speaking with the correct person using two identifiers.  Patient Location: Home  Provider Location: Office/Clinic  I discussed the limitations of evaluation and management by telemedicine. The patient expressed understanding and agreed to proceed.   Review of Systems      Cardiac Risk Factors include: advanced age (>20men, >25 women);dyslipidemia;hypertension;diabetes mellitus;obesity (BMI >30kg/m2)     Objective:    Today's Vitals   01/22/23 1300 01/22/23 1301  Weight: 167 lb (75.8 kg)   Height: 4' 11.5" (1.511 m)   PainSc:  5    Body mass index is 33.17 kg/m.     01/22/2023    1:13 PM 09/10/2022    1:20 PM 05/06/2022    1:33 PM 01/20/2022    1:30 PM 12/01/2021    5:32 PM 11/04/2021    1:58 PM 08/05/2021    1:20 PM  Advanced Directives  Does Patient Have a Medical Advance Directive? Yes Yes Yes Yes No Yes Yes  Type of Estate agent of [place];Living will Living will;Healthcare Power of [employer] Power of [place];Living will Healthcare Power of [place];Living will  Healthcare Power of [employer] of [place];Living will  Does patient want to make changes to medical advance directive? No - Patient declined   Yes (Inpatient - patient defers changing a medical advance directive and declines information at this time)     Copy of Healthcare Power of Attorney in Chart? Yes - validated most recent copy scanned in chart (See row information) Yes - validated most recent copy scanned in chart (See row information)  Yes - validated most recent copy scanned in chart (See row information)       Current Medications (verified) Outpatient Encounter Medications as of 01/22/2023   Medication Sig   Accu-Chek Softclix Lancets lancets TEST BLOOD SUGAR EVERY DAY FOR DIABETES   acetaminophen (TYLENOL) 500 MG tablet Take 1,000 mg by mouth every 6 (six) hours as needed for mild pain.    Alcohol Swabs (DROPSAFE ALCOHOL PREP) 70 % PADS USE ONE TIME DAILY AS DIRECTED WHEN CHECKING BLOOD SUGAR   amitriptyline (ELAVIL) 25 MG tablet TAKE 1 TABLET AT BEDTIME   aspirin 81 MG tablet Take 81 mg by mouth at bedtime.    Bempedoic Acid (NEXLETOL) 180 MG TABS Take 1 tablet (180 mg total) by mouth daily.   Blood Glucose Calibration (ACCU-CHEK AVIVA) SOLN USE TO CALIBRATE METER AS DIRECTED   Blood Glucose Monitoring Suppl (ACCU-CHEK AVIVA PLUS) w/Device KIT Use to check blood sugar once daily for DM (dx. E11.9)   calcium elemental as carbonate (BARIATRIC TUMS ULTRA) 400 MG chewable tablet Chew 1,000 mg by mouth daily as needed for heartburn.   Camphor-Menthol-Methyl Sal (SALONPAS) 3.07-19-08 % PTCH Apply topically.   Cholecalciferol (VITAMIN D) 2000 units tablet Take 2,000 Units by mouth daily.   Docusate Sodium (DSS) 100 MG CAPS Take 1 capsule by mouth 2 (two) times daily as needed.   ezetimibe (ZETIA) 10 MG tablet Take 1 tablet (10 mg total) by mouth daily.   fenofibrate (TRICOR) 145 MG tablet TAKE 1 TABLET EVERY DAY   gabapentin (NEURONTIN) 300 MG capsule 300 mg 3 (three) times daily as needed.   glipiZIDE (GLUCOTROL XL) 5 MG 24 hr tablet TAKE 1  TABLET EVERY DAY WITH BREAKFAST   glucose blood (ACCU-CHEK AVIVA PLUS) test strip TEST BLOOD SUGAR EVERY DAY FOR DIABETES   loratadine (CLARITIN) 10 MG tablet Take 10 mg by mouth daily as needed for allergies.   losartan (COZAAR) 100 MG tablet Take 1 tablet (100 mg total) by mouth daily.   meclizine (ANTIVERT) 25 MG tablet Take 1 tablet (25 mg total) by mouth 3 (three) times daily as needed for dizziness.   metFORMIN (GLUCOPHAGE) 1000 MG tablet TAKE 1/2 TABLET TWICE DAILY WITH MEALS   metoprolol succinate (TOPROL-XL) 50 MG 24 hr tablet Take 1 tablet  (50 mg) by mouth TWICE daily. Take with or immediately following a meal.   omeprazole (PRILOSEC) 40 MG capsule TAKE 1 CAPSULE IN THE MORNING AND AT BEDTIME.   ondansetron (ZOFRAN) 4 MG tablet TAKE 1 TABLET EVERY 4 HOURS AS NEEDED FOR NAUSEA AND VOMITING   SALINE NASAL SPRAY NA Place 1 spray into the nose daily as needed (congestion).    No facility-administered encounter medications on file as of 01/22/2023.    Allergies (verified) Cholestyramine, Simvastatin, Lipitor [atorvastatin], and Niacin   History: Past Medical History:  Diagnosis Date   Allergic rhinitis    Breast cancer (HCC) 1996   right breast   DM2 (diabetes mellitus, type 2) (HCC)    GERD (gastroesophageal reflux disease)    Hx of cardiovascular stress test    a. Lex MV 2/14:  EF 69%, no ischemia   Hyperlipidemia    Hypertension    IDA (iron deficiency anemia) 08/27/2020   Iron deficiency anemia    Obesity    Osteoarthritis    hands   Ulcer    Past Surgical History:  Procedure Laterality Date   APPENDECTOMY  1976   BREAST LUMPECTOMY Right 1996   right breast, lumpectomy w/nodes   CATARACT EXTRACTION W/PHACO Left 11/28/2020   Procedure: CATARACT EXTRACTION PHACO AND INTRAOCULAR LENS PLACEMENT (IOC) LEFT DIABETIC;  Surgeon: Lockie Mola, MD;  Location: [facility];  Service: Ophthalmology;  Laterality: Left;  5.02 01:07.4 7.4%   CATARACT EXTRACTION W/PHACO Right 12/12/2020   Procedure: CATARACT EXTRACTION PHACO AND INTRAOCULAR LENS PLACEMENT (IOC) RIGHT DIABETIC 5.22 00:59.2;  Surgeon: Lockie Mola, MD;  Location: [facility];  Service: Ophthalmology;  Laterality: Right;   CHONDROPLASTY Left 02/10/2018   Procedure: CHONDROPLASTY;  Surgeon: Lyndle Herrlich, MD;  Location: [facility];  Service: Orthopedics;  Laterality: Left;   FOOT SURGERY Left 07/2022   bone removal   GASTRIC RESTRICTION SURGERY     for reflux   HYSTEROSCOPY WITH D & C N/A 10/18/2012   Procedure: DILATATION AND  CURETTAGE /HYSTEROSCOPY;  Surgeon: Catalina Antigua, MD;  Location: [facility];  Service: Gynecology;  Laterality: N/A;   KNEE ARTHROSCOPY WITH MEDIAL MENISECTOMY Left 02/10/2018   Procedure: KNEE ARTHROSCOPY WITH MEDIAL and LATERAL MENISECTOMY;  Surgeon: Lyndle Herrlich, MD;  Location: [facility];  Service: Orthopedics;  Laterality: Left;   POLYPECTOMY N/A 10/18/2012   Procedure: POLYPECTOMY;  Surgeon: Catalina Antigua, MD;  Location: [facility];  Service: Gynecology;  Laterality: N/A;   SYNOVECTOMY Left 02/10/2018   Procedure: SYNOVECTOMY;  Surgeon: Lyndle Herrlich, MD;  Location: [facility];  Service: Orthopedics;  Laterality: Left;   Family History  Problem Relation Age of Onset   Diabetes Mother    Heart disease Mother        Pacemaker, CHF   Hypertension Mother    Kidney cancer Mother    Stroke Father  CAD Father 54       Died with MI   Esophageal cancer Brother    Heart disease Sister    Colon cancer Neg Hx    Rectal cancer Neg Hx    Stomach cancer Neg Hx    Pancreatic cancer Neg Hx    Social History   Socioeconomic History   Marital status: Single    Spouse name: Not on file   Number of children: 0   Years of education: Not on file   Highest education level: Associate degree: occupational, Scientist, product/process development, or vocational program  Occupational History   Occupation: retired    Associate Professor: RETIRED  Tobacco Use   Smoking status: Never   Smokeless tobacco: Never  Vaping Use   Vaping status: Never Used  Substance and Sexual Activity   Alcohol use: Never    Alcohol/week: 0.0 standard drinks of alcohol   Drug use: No   Sexual activity: Not Currently    Birth control/protection: Post-menopausal  Other Topics Concern   Not on file  Social History Narrative   Lives with, and cares for her elderly mother.   Social Determinants of Health   Financial Resource Strain: Low Risk  (01/22/2023)   Overall Financial Resource Strain (CARDIA)    Difficulty of Paying Living Expenses: Not hard at all   Food Insecurity: No Food Insecurity (01/22/2023)   Hunger Vital Sign    Worried About Running Out of Food in the Last Year: Never true    Ran Out of Food in the Last Year: Never true  Transportation Needs: No Transportation Needs (01/22/2023)   PRAPARE - Administrator, Civil Service (Medical): No    Lack of Transportation (Non-Medical): No  Physical Activity: Sufficiently Active (01/22/2023)   Exercise Vital Sign    Days of Exercise per Week: 7 days    Minutes of Exercise per Session: 60 min  Recent Concern: Physical Activity - Inactive (11/10/2022)   Exercise Vital Sign    Days of Exercise per Week: 0 days    Minutes of Exercise per Session: 20 min  Stress: No Stress Concern Present (01/22/2023)   [employer] of Occupational Health - Occupational Stress Questionnaire    Feeling of Stress : Not at all  Social Connections: Moderately Integrated (01/22/2023)   Social Connection and Isolation Panel [NHANES]    Frequency of Communication with Friends and Family: More than three times a week    Frequency of Social Gatherings with Friends and Family: More than three times a week    Attends Religious Services: More than 4 times per year    Active Member of [employer] or Organizations: Yes    Attends Engineer, structural: More than 4 times per year    Marital Status: Never married    Tobacco Counseling Counseling given: Not Answered   Clinical Intake:  Pre-visit preparation completed: Yes  Pain : 0-10 Pain Score: 5  Pain Type: Acute pain Pain Location: Knee Pain Orientation: Right Pain Descriptors / Indicators: Aching Pain Onset: More than a month ago     BMI - recorded: 33.17 Nutritional Status: BMI > 30  Obese Nutritional Risks: Unintentional weight gain (10 pounds over 4 months) Diabetes: Yes CBG done?: Yes (125 this morning per pt) CBG resulted in Enter/ Edit results?: No Did pt. bring in CBG monitor from home?: No  How often do you need to have  someone help you when you read instructions, pamphlets, or other written materials from your doctor  or pharmacy?: 1 - Never  Interpreter Needed?: No  Information entered by :: C.Syre Knerr LPN   Activities of Daily Living    01/22/2023    1:14 PM 01/18/2023    4:54 PM  In your present state of health, do you have any difficulty performing the following activities:  Hearing? 0 0  Vision? 0 0  Difficulty concentrating or making decisions? 0 0  Walking or climbing stairs? 1 1  Comment Bad Knees   Dressing or bathing? 0 0  Doing errands, shopping? 0 1  Preparing Food and eating ? N N  Using the Toilet? N N  In the past six months, have you accidently leaked urine? N N  Do you have problems with loss of bowel control? N N  Managing your Medications? N N  Managing your Finances? N N  Housekeeping or managing your Housekeeping? N N    Patient Care Team: Tower, Audrie Gallus, MD as PCP - General Debbe Odea, MD as PCP - Cardiology (Cardiology) Vilinda Flake, [facility] (Inactive) as Pharmacist (Pharmacist)  Indicate any recent Medical Services you may have received from other than [facility] providers in the past year (date may be approximate).     Assessment:   This is a routine wellness examination for [facility].  Hearing/Vision screen Hearing Screening - Comments:: No hearing issues Vision Screening - Comments:: Readers - had cataract surgery - [facility] - UTD on eye exams.  Dietary issues and exercise activities discussed:     Goals Addressed             This Visit's Progress    Patient Stated       Start walking again.       Depression Screen    01/22/2023    1:12 PM 01/20/2022    1:27 PM 12/18/2021   10:35 AM 01/17/2021    1:18 PM 01/13/2020   10:00 AM 01/10/2019    8:29 AM 12/21/2017    9:01 AM  PHQ 2/9 Scores  PHQ - 2 Score 0 0 0 0 0 0 0  PHQ- 9 Score    0 0 0 0    Fall Risk    01/22/2023    1:14 PM 01/18/2023    4:54 PM 01/20/2022    1:31 PM 12/18/2021   10:34 AM  01/17/2021    1:18 PM  Fall Risk   Falls in the past year? 0 0 1 0 0  Number falls in past yr: 0  0 0 0  Injury with Fall? 0  0 1 0  Risk for fall due to : No Fall Risks  History of fall(s)  Medication side effect  Follow up Falls prevention discussed;Falls evaluation completed  Falls prevention discussed  Falls evaluation completed;Falls prevention discussed    MEDICARE RISK AT HOME:  Medicare Risk at Home - 01/22/23 1315     Any stairs in or around the home? Yes    If so, are there any without handrails? No    Home free of loose throw rugs in walkways, pet beds, electrical cords, etc? Yes    Adequate lighting in your home to reduce risk of falls? Yes    Life alert? No    Use of a cane, walker or w/c? Yes    Grab bars in the bathroom? Yes    Shower chair or bench in shower? Yes    Elevated toilet seat or a handicapped toilet? Yes  TIMED UP AND GO:  Was the test performed?  No    Cognitive Function:    01/17/2021    1:20 PM 01/13/2020   10:05 AM 01/10/2019    8:29 AM 12/21/2017    9:00 AM  MMSE - Mini Mental State Exam  Orientation to time 5 5 5 5   Orientation to Place 5 5 5 5   Registration 3 3 3 3   Attention/ Calculation 5 5 0 0  Recall 3 3 3 3   Language- name 2 objects   0 0  Language- repeat 1 1 1 1   Language- follow 3 step command   0 3  Language- read & follow direction   0 0  Write a sentence   0 0  Copy design   0 0  Total score   17 20        01/22/2023    1:17 PM 01/20/2022    1:37 PM  6CIT Screen  What Year? 0 points 0 points  What month? 0 points 0 points  What time? 0 points 0 points  Count back from 20 0 points 0 points  Months in reverse 0 points 0 points  Repeat phrase 0 points 0 points  Total Score 0 points 0 points    Immunizations Immunization History  Administered Date(s) Administered   Fluad Quad(high Dose 65+) 03/10/2019, 03/29/2020, 03/20/2022   Influenza Split 04/09/2011, 04/01/2012   Influenza Whole 04/23/2007,  04/10/2008, 04/10/2009, 04/02/2010   Influenza,inj,Quad PF,6+ Mos 03/23/2013, 03/29/2014, 03/13/2015, 04/10/2016, 03/25/2017, 04/08/2018   PFIZER(Purple Top)SARS-COV-2 Vaccination 09/05/2019, 09/26/2019, 05/26/2020   Pneumococcal Conjugate-13 02/08/2016   Pneumococcal Polysaccharide-23 04/13/2008, 05/16/2013, 02/26/2017   Td 02/09/2009   Zoster Recombinant(Shingrix) 09/25/2021, 01/06/2022   Zoster, Live 01/28/2011    TDAP status: Due, Education has been provided regarding the importance of this vaccine. Advised may receive this vaccine at local pharmacy or Health Dept. Aware to provide a copy of the vaccination record if obtained from local pharmacy or Health Dept. Verbalized acceptance and understanding.  Flu Vaccine status: Up to date  Pneumococcal vaccine status: Up to date  Covid-19 vaccine status: Information provided on how to obtain vaccines.   Qualifies for Shingles Vaccine? Yes   Zostavax completed Yes   Shingrix Completed?: No.    Education has been provided regarding the importance of this vaccine. Patient has been advised to call insurance company to determine out of pocket expense if they have not yet received this vaccine. Advised may also receive vaccine at local pharmacy or Health Dept. Verbalized acceptance and understanding.  Screening Tests Health Maintenance  Topic Date Due   COVID-19 Vaccine (4 - 2023-24 season) 03/14/2022   COLON CANCER SCREENING ANNUAL FOBT  05/01/2022   FOOT EXAM  09/18/2022   Medicare Annual Wellness (AWV)  01/21/2023   Hepatitis C Screening  03/01/2023 (Originally 01/24/1969)   INFLUENZA VACCINE  02/12/2023   MAMMOGRAM  07/05/2023   HEMOGLOBIN A1C  07/22/2023   OPHTHALMOLOGY EXAM  10/27/2023   Diabetic kidney evaluation - eGFR measurement  01/19/2024   Diabetic kidney evaluation - Urine ACR  01/19/2024   Colonoscopy  05/01/2028   Pneumonia Vaccine 22+ Years old  Completed   DEXA SCAN  Completed   Zoster Vaccines- Shingrix  Completed    HPV VACCINES  Aged Out   DTaP/Tdap/Td  Discontinued    Health Maintenance  Health Maintenance Due  Topic Date Due   COVID-19 Vaccine (4 - 2023-24 season) 03/14/2022   COLON CANCER SCREENING ANNUAL FOBT  05/01/2022  FOOT EXAM  09/18/2022   Medicare Annual Wellness (AWV)  01/21/2023    Colorectal cancer screening: Type of screening: Colonoscopy. Completed 05/01/21. Repeat every 7 years  Mammogram status: Ordered 01/22/23. Pt provided with contact info and advised to call to schedule appt.   Bone Density status: Completed 05/08/20. Results reflect: Bone density results: NORMAL. Repeat every 5 years.  Lung Cancer Screening: (Low Dose CT Chest recommended if Age 7-80 years, 20 pack-year currently smoking OR have quit w/in 15years.) does not qualify.   Lung Cancer Screening Referral: n/a  Additional Screening:  Hepatitis C Screening: does qualify; Completed Due at next visit  Vision Screening: Recommended annual ophthalmology exams for early detection of glaucoma and other disorders of the eye. Is the patient up to date with their annual eye exam?  Yes  Who is the provider or what is the name of the office in which the patient attends annual eye exams? [facility] If pt is not established with a provider, would they like to be referred to a provider to establish care? Yes .   Dental Screening: Recommended annual dental exams for proper oral hygiene  Diabetic Foot Exam: Diabetic Foot Exam: Completed 09/17/21  Community Resource Referral / Chronic Care Management: CRR required this visit?  No   CCM required this visit?  No     Plan:     I have personally reviewed and noted the following in the patient's chart:   Medical and social history Use of alcohol, tobacco or illicit drugs  Current medications and supplements including opioid prescriptions. Patient is not currently taking opioid prescriptions. Functional ability and status Nutritional status Physical  activity Advanced directives List of other physicians Hospitalizations, surgeries, and ER visits in previous 12 months Vitals Screenings to include cognitive, depression, and falls Referrals and appointments  In addition, I have reviewed and discussed with patient certain preventive protocols, quality metrics, and best practice recommendations. A written personalized care plan for preventive services as well as general preventive health recommendations were provided to patient.     Maryan Puls, LPN   1/61/0960   After Visit Summary: (MyChart) Due to this being a telephonic visit, the after visit summary with patients personalized plan was offered to patient via MyChart   Nurse Notes: none

## 2023-02-03 ENCOUNTER — Encounter: Payer: Self-pay | Admitting: Family Medicine

## 2023-02-03 ENCOUNTER — Ambulatory Visit: Payer: Medicare PPO | Admitting: Family Medicine

## 2023-02-03 VITALS — BP 122/60 | HR 94 | Temp 97.6°F | Ht 59.25 in | Wt 177.1 lb

## 2023-02-03 DIAGNOSIS — Z0000 Encounter for general adult medical examination without abnormal findings: Secondary | ICD-10-CM

## 2023-02-03 DIAGNOSIS — I10 Essential (primary) hypertension: Secondary | ICD-10-CM

## 2023-02-03 DIAGNOSIS — R000 Tachycardia, unspecified: Secondary | ICD-10-CM

## 2023-02-03 DIAGNOSIS — K219 Gastro-esophageal reflux disease without esophagitis: Secondary | ICD-10-CM

## 2023-02-03 DIAGNOSIS — E119 Type 2 diabetes mellitus without complications: Secondary | ICD-10-CM

## 2023-02-03 DIAGNOSIS — E1169 Type 2 diabetes mellitus with other specified complication: Secondary | ICD-10-CM

## 2023-02-03 DIAGNOSIS — D649 Anemia, unspecified: Secondary | ICD-10-CM

## 2023-02-03 DIAGNOSIS — Z0001 Encounter for general adult medical examination with abnormal findings: Secondary | ICD-10-CM

## 2023-02-03 DIAGNOSIS — E785 Hyperlipidemia, unspecified: Secondary | ICD-10-CM

## 2023-02-03 DIAGNOSIS — E538 Deficiency of other specified B group vitamins: Secondary | ICD-10-CM | POA: Diagnosis not present

## 2023-02-03 DIAGNOSIS — D473 Essential (hemorrhagic) thrombocythemia: Secondary | ICD-10-CM

## 2023-02-03 DIAGNOSIS — E669 Obesity, unspecified: Secondary | ICD-10-CM

## 2023-02-03 DIAGNOSIS — G720 Drug-induced myopathy: Secondary | ICD-10-CM

## 2023-02-03 DIAGNOSIS — K760 Fatty (change of) liver, not elsewhere classified: Secondary | ICD-10-CM

## 2023-02-03 DIAGNOSIS — T466X5A Adverse effect of antihyperlipidemic and antiarteriosclerotic drugs, initial encounter: Secondary | ICD-10-CM

## 2023-02-03 MED ORDER — OMEPRAZOLE 40 MG PO CPDR: DELAYED_RELEASE_CAPSULE | ORAL | 3 refills | Status: DC

## 2023-02-03 NOTE — Assessment & Plan Note (Signed)
Reviewed health habits including diet and exercise and skin cancer prevention Reviewed appropriate screening tests for age  Also reviewed health mt list, fam hx and immunization status , as well as social and family history   See HPI Labs reviewed and ordered Mammogram utd 06/2022 (personal history of breast cancer)  Colonoscopy 04/2021 with 7 y recall  Eye exam 10/2022 Dexa normal 2021   no falls or fracture  Discussed opt for exercise with knee pain  PHQ :0 Will watch for OSA eval

## 2023-02-03 NOTE — Assessment & Plan Note (Signed)
Lab Results  Component Value Date   VITAMINB12 271 01/19/2023   Gets shot monthly  Sees hematology

## 2023-02-03 NOTE — Assessment & Plan Note (Signed)
Unable to take statin for choleserol

## 2023-02-03 NOTE — Assessment & Plan Note (Signed)
bp in fair control at this time  Improved off of the celebres BP Readings from Last 1 Encounters:  02/03/23 122/60   No changes needed Most recent labs reviewed  Disc lifstyle change with low sodium diet and exercise  Continues Losartan 100 mg daily  Metoprolol xl 50 mg dialy

## 2023-02-03 NOTE — Assessment & Plan Note (Signed)
Under herme care  Last plt ct 433 Is iron def

## 2023-02-03 NOTE — Assessment & Plan Note (Signed)
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

## 2023-02-03 NOTE — Assessment & Plan Note (Signed)
LFTs in normal range Encouraged better diet and weight loss

## 2023-02-03 NOTE — Assessment & Plan Note (Signed)
Discussed how this problem influences overall health and the risks it imposes  Reviewed plan for weight loss with lower calorie diet (via better food choices (lower glycemic and portion control) along with exercise building up to or more than 30 minutes 5 days per week including some aerobic activity and strength training

## 2023-02-03 NOTE — Progress Notes (Signed)
Subjective:    Patient ID: Martha Hamilton, female    DOB: [birth year], 72 y.o.   MRN: [id number]  HPI  Here for health maintenance exam and to review chronic medical problems   Wt Readings from Last 3 Encounters:  02/03/23 177 lb 2 oz (80.3 kg)  01/22/23 167 lb (75.8 kg)  12/12/22 175 lb 9.6 oz (79.7 kg)   35.47 kg/m  Vitals:   02/03/23 0918 02/03/23 0956  BP: (!) 144/74 122/60  Pulse: 94   Temp: 97.6 F (36.4 C)   SpO2: 97%     Immunization History  Administered Date(s) Administered   Fluad Quad(high Dose 65+) 03/10/2019, 03/29/2020, 03/20/2022   Influenza Split 04/09/2011, 04/01/2012   Influenza Whole 04/23/2007, 04/10/2008, 04/10/2009, 04/02/2010   Influenza,inj,Quad PF,6+ Mos 03/23/2013, 03/29/2014, 03/13/2015, 04/10/2016, 03/25/2017, 04/08/2018   PFIZER(Purple Top)SARS-COV-2 Vaccination 09/05/2019, 09/26/2019, 05/26/2020   Pneumococcal Conjugate-13 02/08/2016   Pneumococcal Polysaccharide-23 04/13/2008, 05/16/2013, 02/26/2017   Td 02/09/2009   Zoster Recombinant(Shingrix) 09/25/2021, 01/06/2022   Zoster, Live 01/28/2011    Health Maintenance Due  Topic Date Due   COLON CANCER SCREENING ANNUAL FOBT  05/01/2022   FOOT EXAM  09/18/2022     Mammogram-last was 06/2022  Personal history of breast cancer  Has order for last one  Self breast exam: no lumps   Gyn health-no problems    Colon cancer screening - colonoscopy 04/2021 - with 7 year recall  Utd eye exam from April     Dexa  04/2020 - bmd normal  Falls-none Fractures-none  Supplements - vitamin D , and a gummy mvi  Exercise - limited by knee problems (had a gel shot 2 weeks ago) Does some chair exercise  A lot of back and leg exercises lying down      Mood    01/22/2023    1:12 PM 01/20/2022    1:27 PM 12/18/2021   10:35 AM 01/17/2021    1:18 PM 01/13/2020   10:00 AM  Depression screen PHQ 2/9  Decreased Interest 0 0 0 0 0  Down, Depressed, Hopeless 0 0 0 0 0  PHQ - 2 Score 0 0 0 0 0   Altered sleeping    0 0  Tired, decreased energy  0  0 0  Change in appetite  0  0 0  Feeling bad or failure about yourself   0  0 0  Trouble concentrating  0  0 0  Moving slowly or fidgety/restless  0  0 0  Suicidal thoughts  0  0 0  PHQ-9 Score    0 0  Difficult doing work/chores  Not difficult at all  Not difficult at all Not difficult at all   HTN bp is stable today  No cp or palpitations or headaches or edema  No side effects to medicines  BP Readings from Last 3 Encounters:  02/03/23 122/60  12/12/22 136/78  11/26/22 130/70    Losartan 100 mg daily  Metoprolol xl 50 mg daily -was increase to bid by cardiology   Pulse Readings from Last 3 Encounters:  02/03/23 94  12/12/22 (!) 105  11/26/22 100     Was ref to pulm for OSA eval Thinks she is overall sleeping better    Ckd Lab Results  Component Value Date   NA 138 01/19/2023   K 4.4 01/19/2023   CO2 26 01/19/2023   GLUCOSE 176 (H) 01/19/2023   BUN 23 01/19/2023   CREATININE 1.07 01/19/2023   CALCIUM 9.0  01/19/2023   GFR 52.09 (L) 01/19/2023   GFRNONAA 49 (L) 09/08/2022   GFR is improved from the 40s  Is drinking fluids - water / ice    GERd Omeprazole 40 mg bid-high dose from GI One shot monthly  No oral supplementation  Lab Results  Component Value Date   VITAMINB12 271 01/19/2023    History of fatty liver Lab Results  Component Value Date   ALT 11 01/19/2023   AST 13 01/19/2023   ALKPHOS 33 (L) 01/19/2023   BILITOT 0.2 01/19/2023   DM2 Lab Results  Component Value Date   HGBA1C 7.6 (H) 01/19/2023   This is up from 5.7   Metforin 500 mg bid  Glipizide xl 5 mg daily   Eating more corn on the cob Peas  Beans  No dairy   A lot of dry cereal   Not cooking much   Ate sweets one day only     Lab Results  Component Value Date   MICROALBUR 4.9 (H) 01/19/2023   MICROALBUR 1.1 04/29/2010  Ratio      Hyperlipidemia Lab Results  Component Value Date   CHOL 145 01/19/2023    CHOL 140 05/29/2022   CHOL 176 12/11/2021   Lab Results  Component Value Date   HDL 35.00 (L) 01/19/2023   HDL 31 (L) 05/29/2022   HDL 38.80 (L) 12/11/2021   Lab Results  Component Value Date   LDLCALC 31 05/29/2022   LDLCALC 51 10/21/2011   LDLCALC 48 04/29/2010   Lab Results  Component Value Date   TRIG 312.0 (H) 01/19/2023   TRIG 389 (H) 05/29/2022   TRIG 396.0 (H) 12/11/2021   Lab Results  Component Value Date   CHOLHDL 4 01/19/2023   CHOLHDL 4.5 05/29/2022   CHOLHDL 5 12/11/2021   Lab Results  Component Value Date   LDLDIRECT 80.0 01/19/2023   LDLDIRECT 89.0 12/11/2021   LDLDIRECT 100.0 09/09/2021   Zetia 10 mg daily  Cannot take statins due to myopathy Tricor 145 mg daily  Bempedoic acid 180 mg once daily  Sees cardiology  Trig are stable   History of elevated platelet ct and normocytic anemia  Also iron def Has seen hematology for iron infusions  Lab Results  Component Value Date   WBC 6.6 01/19/2023   HGB 9.8 (L) 01/19/2023   HCT 31.3 (L) 01/19/2023   MCV 81.8 01/19/2023   PLT 433.0 (H) 01/19/2023       Patient Active Problem List   Diagnosis Date Noted   Pedal edema 11/12/2022   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   Degenerative disc disease, cervical 03/03/2022   Muscle cramps 02/28/2022   Neck pain 02/28/2022   Syncope 12/18/2021   Strain of flexor tendon of wrist 12/02/2021   Tendinitis of left wrist 12/02/2021   Left wrist pain 11/13/2021   Family history of cancer 08/27/2020   History of breast cancer 08/27/2020   IDA (iron deficiency anemia) 08/27/2020   B12 deficiency 08/27/2020   Chronic kidney disease (CKD) stage G3b/A1, moderately decreased glomerular filtration rate (GFR) between 30-44 mL/min/1.73 square meter and albuminuria creatinine ratio less than 30 mg/g (HCC) 07/24/2020   Tachycardia 01/11/2019   Fatty liver 06/21/2018   Obesity (BMI 30-39.9) 06/21/2018   Dyspepsia 05/25/2018   GERD (gastroesophageal reflux disease)  05/25/2018   H/O vertigo 05/11/2018   Essential (hemorrhagic) thrombocythemia (HCC) 12/25/2017   Tear of medial meniscus of knee 12/21/2017   Pain in left knee 12/21/2017   Left knee  pain 09/21/2017   Welcome to Medicare preventive visit 12/17/2016   Grief reaction 12/17/2016   Estrogen deficiency 09/08/2016   Myalgia 02/08/2016   Normocytic anemia 02/09/2013   Abnormal EKG 07/27/2012   Special screening for malignant neoplasms, colon 04/29/2011   Gynecological examination 04/29/2011   Routine general medical examination at a health care facility 04/20/2011   OSTEOARTHRITIS, HANDS, BILATERAL 10/31/2008   Diabetes type 2, controlled (HCC) 07/10/2008   Hyperlipidemia associated with type 2 diabetes mellitus (HCC) 01/26/2008   Essential hypertension 01/26/2008   ALLERGIC RHINITIS 12/07/2007   Past Medical History:  Diagnosis Date   Allergic rhinitis    Allergy    Asthma    Breast cancer (HCC) 1996   right breast   Cataract Oct. 2021   Depression 1981   DM2 (diabetes mellitus, type 2) (HCC)    GERD (gastroesophageal reflux disease)    Hx of cardiovascular stress test    a. Lex MV 2/14:  EF 69%, no ischemia   Hyperlipidemia    Hypertension    IDA (iron deficiency anemia) 08/27/2020   Iron deficiency anemia    Obesity    Osteoarthritis    hands   Ulcer    Past Surgical History:  Procedure Laterality Date   APPENDECTOMY  1976   BREAST LUMPECTOMY Right 1996   right breast, lumpectomy w/nodes   CATARACT EXTRACTION W/PHACO Left 11/28/2020   Procedure: CATARACT EXTRACTION PHACO AND INTRAOCULAR LENS PLACEMENT (IOC) LEFT DIABETIC;  Surgeon: Lockie Mola, MD;  Location: [facility];  Service: Ophthalmology;  Laterality: Left;  5.02 01:07.4 7.4%   CATARACT EXTRACTION W/PHACO Right 12/12/2020   Procedure: CATARACT EXTRACTION PHACO AND INTRAOCULAR LENS PLACEMENT (IOC) RIGHT DIABETIC 5.22 00:59.2;  Surgeon: Lockie Mola, MD;  Location: [facility];   Service: Ophthalmology;  Laterality: Right;   CHONDROPLASTY Left 02/10/2018   Procedure: CHONDROPLASTY;  Surgeon: Lyndle Herrlich, MD;  Location: [facility];  Service: Orthopedics;  Laterality: Left;   EYE SURGERY     FOOT SURGERY Left 07/2022   bone removal   GASTRIC RESTRICTION SURGERY     for reflux   HYSTEROSCOPY WITH D & C N/A 10/18/2012   Procedure: DILATATION AND CURETTAGE /HYSTEROSCOPY;  Surgeon: Catalina Antigua, MD;  Location: [facility];  Service: Gynecology;  Laterality: N/A;   KNEE ARTHROSCOPY WITH MEDIAL MENISECTOMY Left 02/10/2018   Procedure: KNEE ARTHROSCOPY WITH MEDIAL and LATERAL MENISECTOMY;  Surgeon: Lyndle Herrlich, MD;  Location: [facility];  Service: Orthopedics;  Laterality: Left;   POLYPECTOMY N/A 10/18/2012   Procedure: POLYPECTOMY;  Surgeon: Catalina Antigua, MD;  Location: [facility];  Service: Gynecology;  Laterality: N/A;   SYNOVECTOMY Left 02/10/2018   Procedure: SYNOVECTOMY;  Surgeon: Lyndle Herrlich, MD;  Location: [facility];  Service: Orthopedics;  Laterality: Left;   Social History   Tobacco Use   Smoking status: Never   Smokeless tobacco: Never   Tobacco comments:    Never smoked  Vaping Use   Vaping status: Never Used  Substance Use Topics   Alcohol use: Never   Drug use: Never   Family History  Problem Relation Age of Onset   Diabetes Mother    Heart disease Mother        Pacemaker, CHF   Hypertension Mother    Kidney cancer Mother    Cancer Mother    Stroke Father    CAD Father 38       Died with MI   Heart disease Father  Esophageal cancer Brother    Diabetes Brother    Heart disease Sister    Cancer Brother    Diabetes Sister    Colon cancer Neg Hx    Rectal cancer Neg Hx    Stomach cancer Neg Hx    Pancreatic cancer Neg Hx    Allergies  Allergen Reactions   Cholestyramine Other (See Comments)    REACTION: Muscles tightened up, drew up.   Simvastatin Other (See Comments)    Muscular pain   Lipitor [Atorvastatin] Other (See Comments)     Muscle cramping   Niacin Nausea And Vomiting   Current Outpatient Medications on File Prior to Visit  Medication Sig Dispense Refill   Accu-Chek Softclix Lancets lancets TEST BLOOD SUGAR EVERY DAY FOR DIABETES 100 each 1   acetaminophen (TYLENOL) 500 MG tablet Take 1,000 mg by mouth every 6 (six) hours as needed for mild pain.      Alcohol Swabs (DROPSAFE ALCOHOL PREP) 70 % PADS USE ONE TIME DAILY AS DIRECTED WHEN CHECKING BLOOD SUGAR 100 each 1   amitriptyline (ELAVIL) 25 MG tablet TAKE 1 TABLET AT BEDTIME 90 tablet 1   aspirin 81 MG tablet Take 81 mg by mouth at bedtime.      Bempedoic Acid (NEXLETOL) 180 MG TABS Take 1 tablet (180 mg total) by mouth daily. 30 tablet 2   Blood Glucose Calibration (ACCU-CHEK AVIVA) SOLN USE TO CALIBRATE METER AS DIRECTED 1 each 2   Blood Glucose Monitoring Suppl (ACCU-CHEK AVIVA PLUS) w/Device KIT Use to check blood sugar once daily for DM (dx. E11.9) 1 kit 0   calcium elemental as carbonate (BARIATRIC TUMS ULTRA) 400 MG chewable tablet Chew 1,000 mg by mouth daily as needed for heartburn.     Camphor-Menthol-Methyl Sal (SALONPAS) 3.07-19-08 % PTCH Apply topically.     Cholecalciferol (VITAMIN D) 2000 units tablet Take 2,000 Units by mouth daily.     Docusate Sodium (DSS) 100 MG CAPS Take 1 capsule by mouth 2 (two) times daily as needed.     ezetimibe (ZETIA) 10 MG tablet Take 1 tablet (10 mg total) by mouth daily. 30 tablet 3   fenofibrate (TRICOR) 145 MG tablet TAKE 1 TABLET EVERY DAY 90 tablet 1   gabapentin (NEURONTIN) 300 MG capsule 300 mg 3 (three) times daily as needed.     glipiZIDE (GLUCOTROL XL) 5 MG 24 hr tablet TAKE 1 TABLET EVERY DAY WITH BREAKFAST 90 tablet 1   glucose blood (ACCU-CHEK AVIVA PLUS) test strip TEST BLOOD SUGAR EVERY DAY FOR DIABETES 100 strip 1   loratadine (CLARITIN) 10 MG tablet Take 10 mg by mouth daily as needed for allergies.     losartan (COZAAR) 100 MG tablet Take 1 tablet (100 mg total) by mouth daily. 90 tablet 3    meclizine (ANTIVERT) 25 MG tablet Take 1 tablet (25 mg total) by mouth 3 (three) times daily as needed for dizziness. 90 tablet 3   metFORMIN (GLUCOPHAGE) 1000 MG tablet TAKE 1/2 TABLET TWICE DAILY WITH MEALS 90 tablet 1   metoprolol succinate (TOPROL-XL) 50 MG 24 hr tablet Take 1 tablet (50 mg) by mouth TWICE daily. Take with or immediately following a meal. 180 tablet 3   ondansetron (ZOFRAN) 4 MG tablet TAKE 1 TABLET EVERY 4 HOURS AS NEEDED FOR NAUSEA AND VOMITING 90 tablet 0   SALINE NASAL SPRAY NA Place 1 spray into the nose daily as needed (congestion).      No current facility-administered medications on file prior  to visit.    Review of Systems  Constitutional:  Positive for fatigue. Negative for activity change, appetite change, fever and unexpected weight change.  HENT:  Negative for congestion, ear pain, rhinorrhea, sinus pressure and sore throat.   Eyes:  Negative for pain, redness and visual disturbance.  Respiratory:  Negative for cough, shortness of breath and wheezing.   Cardiovascular:  Negative for chest pain and palpitations.  Gastrointestinal:  Negative for abdominal pain, blood in stool, constipation and diarrhea.  Endocrine: Negative for polydipsia and polyuria.  Genitourinary:  Negative for dysuria, frequency and urgency.  Musculoskeletal:  Positive for arthralgias. Negative for back pain and myalgias.  Skin:  Negative for pallor and rash.  Allergic/Immunologic: Negative for environmental allergies.  Neurological:  Negative for dizziness, syncope and headaches.  Hematological:  Negative for adenopathy. Does not bruise/bleed easily.  Psychiatric/Behavioral:  Negative for decreased concentration and dysphoric mood. The patient is not nervous/anxious.        Objective:   Physical Exam Constitutional:      General: She is not in acute distress.    Appearance: Normal appearance. She is well-developed. She is obese. She is not ill-appearing or diaphoretic.  HENT:      Head: Normocephalic and atraumatic.     Right Ear: Tympanic membrane, ear canal and external ear normal.     Left Ear: Tympanic membrane, ear canal and external ear normal.     Nose: Nose normal. No congestion.     Mouth/Throat:     Mouth: Mucous membranes are moist.     Pharynx: Oropharynx is clear. No posterior oropharyngeal erythema.  Eyes:     General: No scleral icterus.    Extraocular Movements: Extraocular movements intact.     Conjunctiva/sclera: Conjunctivae normal.     Pupils: Pupils are equal, round, and reactive to light.  Neck:     Thyroid: No thyromegaly.     Vascular: No carotid bruit or JVD.  Cardiovascular:     Rate and Rhythm: Normal rate and regular rhythm.     Pulses: Normal pulses.     Heart sounds: Normal heart sounds.     No gallop.  Pulmonary:     Effort: Pulmonary effort is normal. No respiratory distress.     Breath sounds: Normal breath sounds. No wheezing.     Comments: Good air exch Chest:     Chest wall: No tenderness.  Abdominal:     General: Bowel sounds are normal. There is no distension or abdominal bruit.     Palpations: Abdomen is soft. There is no mass.     Tenderness: There is no abdominal tenderness.     Hernia: No hernia is present.  Genitourinary:    Comments: Breast exam: No mass, nodules, thickening, tenderness, bulging, retraction, inflamation, nipple discharge or skin changes noted.  No axillary or clavicular LA.     Musculoskeletal:        General: No tenderness. Normal range of motion.     Cervical back: Normal range of motion and neck supple. No rigidity. No muscular tenderness.     Right lower leg: No edema.     Left lower leg: No edema.     Comments: No kyphosis   Lymphadenopathy:     Cervical: No cervical adenopathy.  Skin:    General: Skin is warm and dry.     Coloration: Skin is not pale.     Findings: No erythema or rash.     Comments: Fair  Solar lentigines  diffusely   Neurological:     Mental Status: She is  alert. Mental status is at baseline.     Cranial Nerves: No cranial nerve deficit.     Motor: No abnormal muscle tone.     Coordination: Coordination normal.     Gait: Gait normal.     Deep Tendon Reflexes: Reflexes are normal and symmetric. Reflexes normal.  Psychiatric:        Mood and Affect: Mood normal.        Cognition and Memory: Cognition and memory normal.           Assessment & Plan:   Problem List Items Addressed This Visit       Cardiovascular and Mediastinum   Essential hypertension    bp in fair control at this time  Improved off of the celebres BP Readings from Last 1 Encounters:  02/03/23 122/60   No changes needed Most recent labs reviewed  Disc lifstyle change with low sodium diet and exercise  Continues Losartan 100 mg daily  Metoprolol xl 50 mg dialy           Digestive   GERD (gastroesophageal reflux disease)    Unfortunately continues to need omep 40 mg bid  B12 is low normal/ taking shots Watching renal fxn      Relevant Medications   omeprazole (PRILOSEC) 40 MG capsule   Fatty liver    LFTs in normal range Encouraged better diet and weight loss         Endocrine   Hyperlipidemia associated with type 2 diabetes mellitus (HCC)    Disc goals for lipids and reasons to control them Rev last labs with pt Rev low sat fat diet in detail Zetia 10 mg daily  Tricore 145 mg daily  Bempedoic acid 180 mg daily   H/p statin myopathy LDL well controlled Trig 312 in setting of uncontrolled dm2       Diabetes type 2, controlled (HCC)    Lab Results  Component Value Date   HGBA1C 7.6 (H) 01/19/2023   Significantly worse control Possible due to cereal (eating a lot) and less exercise disc imp of low glycemic diet and wt loss  Utd eye exam  Utd microalb  No statin due to myopathy  Will work on diet  Follow up 3 mo If not improved will address possible GLP-1 drug for tuture         Musculoskeletal and Integument   Statin  myopathy    Unable to take statin for choleserol          Hematopoietic and Hemostatic   Essential (hemorrhagic) thrombocythemia (HCC)    Under herme care  Last plt ct 433 Is iron def          Other   B12 deficiency (Chronic)    Lab Results  Component Value Date   VITAMINB12 271 01/19/2023   Gets shot monthly  Sees hematology      Tachycardia   Routine general medical examination at a health care facility - Primary    Reviewed health habits including diet and exercise and skin cancer prevention Reviewed appropriate screening tests for age  Also reviewed health mt list, fam hx and immunization status , as well as social and family history   See HPI Labs reviewed and ordered Mammogram utd 06/2022 (personal history of breast cancer)  Colonoscopy 04/2021 with 7 y recall  Eye exam 10/2022 Dexa normal 2021   no falls or fracture  Discussed opt  for exercise with knee pain  PHQ :0 Will watch for OSA eval       Obesity (BMI 30-39.9)    Discussed how this problem influences overall health and the risks it imposes  Reviewed plan for weight loss with lower calorie diet (via better food choices (lower glycemic and portion control) along with exercise building up to or more than 30 minutes 5 days per week including some aerobic activity and strength training         Normocytic anemia    Continues heme care with iron infusions

## 2023-02-03 NOTE — Assessment & Plan Note (Signed)
Continues heme care with iron infusions

## 2023-02-03 NOTE — Patient Instructions (Addendum)
Add some strength training to your routine, this is important for bone and brain health and can reduce your risk of falls and help your body use insulin properly and regulate weight  Light weights, exercise bands , and internet videos are a good way to start  Yoga (chair or regular), machines , floor exercises or a gym with machines are also good options   Pedaling a bike may be an option-consider an exercise bike for home or going to a gym or fitness center   Try to get most of your carbohydrates from produce (with the exception of white potatoes) and whole grains Eat less bread/pasta/rice/snack foods/cereals/sweets and other items from the middle of the grocery store (processed carbs)  Get away from the cereal  Prep other cold foods like salads or half sandwiches or wraps   The following are examples of protein in diet  Meat  Fish  Eggs  Usually dairy (I know you cannot have)  Soy products  Oat milk  Almond milk Nuts and nut butters  Dried beans   Follow up in 3 months  You may be a good candidate for ozempic or similar medicine if not improved

## 2023-02-03 NOTE — Assessment & Plan Note (Signed)
Disc goals for lipids and reasons to control them Rev last labs with pt Rev low sat fat diet in detail Zetia 10 mg daily  Tricore 145 mg daily  Bempedoic acid 180 mg daily   H/p statin myopathy LDL well controlled Trig 312 in setting of uncontrolled dm2

## 2023-02-03 NOTE — Assessment & Plan Note (Signed)
Unfortunately continues to need omep 40 mg bid  B12 is low normal/ taking shots Watching renal fxn

## 2023-02-09 ENCOUNTER — Inpatient Hospital Stay: Payer: Medicare PPO | Attending: Internal Medicine

## 2023-02-09 DIAGNOSIS — E538 Deficiency of other specified B group vitamins: Secondary | ICD-10-CM | POA: Diagnosis not present

## 2023-02-09 DIAGNOSIS — D508 Other iron deficiency anemias: Secondary | ICD-10-CM

## 2023-02-09 MED ORDER — CYANOCOBALAMIN 1000 MCG/ML IJ SOLN
1000.0000 ug | Freq: Once | INTRAMUSCULAR | Status: AC
  Administered 2023-02-09: 1000 ug via INTRAMUSCULAR
  Filled 2023-02-09: qty 1

## 2023-02-19 ENCOUNTER — Other Ambulatory Visit: Payer: Self-pay

## 2023-02-19 MED ORDER — EZETIMIBE 10 MG PO TABS: 10.0000 mg | ORAL_TABLET | Freq: Every day | ORAL | 1 refills | Status: DC

## 2023-02-19 NOTE — Telephone Encounter (Signed)
Requested Prescriptions   Signed Prescriptions Disp Refills   ezetimibe (ZETIA) 10 MG tablet 30 tablet 1    Sig: Take 1 tablet (10 mg total) by mouth daily.    Authorizing Provider: Debbe Odea    Ordering User: Guerry Minors

## 2023-02-25 ENCOUNTER — Other Ambulatory Visit: Payer: Self-pay | Admitting: Family Medicine

## 2023-03-04 ENCOUNTER — Other Ambulatory Visit: Payer: Self-pay | Admitting: Family Medicine

## 2023-03-06 ENCOUNTER — Inpatient Hospital Stay: Payer: Medicare PPO | Attending: Internal Medicine

## 2023-03-06 DIAGNOSIS — E119 Type 2 diabetes mellitus without complications: Secondary | ICD-10-CM | POA: Insufficient documentation

## 2023-03-06 DIAGNOSIS — E538 Deficiency of other specified B group vitamins: Secondary | ICD-10-CM | POA: Diagnosis not present

## 2023-03-06 DIAGNOSIS — D508 Other iron deficiency anemias: Secondary | ICD-10-CM

## 2023-03-06 DIAGNOSIS — Z853 Personal history of malignant neoplasm of breast: Secondary | ICD-10-CM | POA: Insufficient documentation

## 2023-03-06 DIAGNOSIS — E781 Pure hyperglyceridemia: Secondary | ICD-10-CM

## 2023-03-06 DIAGNOSIS — D509 Iron deficiency anemia, unspecified: Secondary | ICD-10-CM | POA: Insufficient documentation

## 2023-03-06 LAB — CMP (CANCER CENTER ONLY)
ALT: 13 U/L (ref 0–44)
AST: 19 U/L (ref 15–41)
Albumin: 3.5 g/dL (ref 3.5–5.0)
Alkaline Phosphatase: 36 U/L — ABNORMAL LOW (ref 38–126)
Anion gap: 9 (ref 5–15)
BUN: 27 mg/dL — ABNORMAL HIGH (ref 8–23)
CO2: 21 mmol/L — ABNORMAL LOW (ref 22–32)
Calcium: 9.3 mg/dL (ref 8.9–10.3)
Chloride: 107 mmol/L (ref 98–111)
Creatinine: 1.06 mg/dL — ABNORMAL HIGH (ref 0.44–1.00)
GFR, Estimated: 56 mL/min — ABNORMAL LOW (ref >60)
Glucose, Bld: 102 mg/dL — ABNORMAL HIGH (ref 70–99)
Potassium: 4 mmol/L (ref 3.5–5.1)
Sodium: 137 mmol/L (ref 135–145)
Total Bilirubin: 0.3 mg/dL (ref 0.3–1.2)
Total Protein: 7.1 g/dL (ref 6.5–8.1)

## 2023-03-06 LAB — VITAMIN B12: Vitamin B-12: 173 pg/mL — ABNORMAL LOW (ref 180–914)

## 2023-03-06 LAB — CBC WITH DIFFERENTIAL (CANCER CENTER ONLY)
Abs Immature Granulocytes: 0.03 10*3/uL (ref 0.00–0.07)
Basophils Absolute: 0.1 10*3/uL (ref 0.0–0.1)
Basophils Relative: 1 %
Eosinophils Absolute: 0.3 10*3/uL (ref 0.0–0.5)
Eosinophils Relative: 4 %
HCT: 31.8 % — ABNORMAL LOW (ref 36.0–46.0)
Hemoglobin: 9.6 g/dL — ABNORMAL LOW (ref 12.0–15.0)
Immature Granulocytes: 0 %
Lymphocytes Relative: 24 %
Lymphs Abs: 1.8 10*3/uL (ref 0.7–4.0)
MCH: 23.9 pg — ABNORMAL LOW (ref 26.0–34.0)
MCHC: 30.2 g/dL (ref 30.0–36.0)
MCV: 79.1 fL — ABNORMAL LOW (ref 80.0–100.0)
Monocytes Absolute: 0.5 10*3/uL (ref 0.1–1.0)
Monocytes Relative: 6 %
Neutro Abs: 4.8 10*3/uL (ref 1.7–7.7)
Neutrophils Relative %: 65 %
Platelet Count: 436 10*3/uL — ABNORMAL HIGH (ref 150–400)
RBC: 4.02 MIL/uL (ref 3.87–5.11)
RDW: 14.5 % (ref 11.5–15.5)
WBC Count: 7.6 10*3/uL (ref 4.0–10.5)
nRBC: 0 % (ref 0.0–0.2)

## 2023-03-06 LAB — LIPID PANEL
Cholesterol: 148 mg/dL (ref 0–200)
HDL: 39 mg/dL — ABNORMAL LOW (ref >40)
LDL Cholesterol: 47 mg/dL (ref 0–99)
Total CHOL/HDL Ratio: 3.8 RATIO
Triglycerides: 308 mg/dL — ABNORMAL HIGH (ref <150)
VLDL: 62 mg/dL — ABNORMAL HIGH (ref 0–40)

## 2023-03-06 LAB — IRON AND TIBC
Iron: 32 ug/dL (ref 28–170)
Saturation Ratios: 6 % — ABNORMAL LOW (ref 10.4–31.8)
TIBC: 514 ug/dL — ABNORMAL HIGH (ref 250–450)
UIBC: 482 ug/dL

## 2023-03-06 LAB — FERRITIN: Ferritin: 6 ng/mL — ABNORMAL LOW (ref 11–307)

## 2023-03-11 ENCOUNTER — Encounter: Payer: Self-pay | Admitting: Oncology

## 2023-03-11 ENCOUNTER — Inpatient Hospital Stay: Payer: Medicare PPO

## 2023-03-11 ENCOUNTER — Inpatient Hospital Stay: Payer: Medicare PPO | Admitting: Oncology

## 2023-03-11 VITALS — BP 145/73 | HR 83 | Temp 98.6°F | Resp 19

## 2023-03-11 VITALS — BP 140/67 | HR 91 | Temp 98.2°F | Resp 18 | Wt 175.9 lb

## 2023-03-11 DIAGNOSIS — Z853 Personal history of malignant neoplasm of breast: Secondary | ICD-10-CM

## 2023-03-11 DIAGNOSIS — D509 Iron deficiency anemia, unspecified: Secondary | ICD-10-CM | POA: Diagnosis not present

## 2023-03-11 DIAGNOSIS — E538 Deficiency of other specified B group vitamins: Secondary | ICD-10-CM

## 2023-03-11 DIAGNOSIS — E119 Type 2 diabetes mellitus without complications: Secondary | ICD-10-CM | POA: Diagnosis not present

## 2023-03-11 DIAGNOSIS — D508 Other iron deficiency anemias: Secondary | ICD-10-CM

## 2023-03-11 MED ORDER — CYANOCOBALAMIN 1000 MCG/ML IJ SOLN
1000.0000 ug | Freq: Once | INTRAMUSCULAR | Status: AC
  Administered 2023-03-11: 1000 ug via INTRAMUSCULAR
  Filled 2023-03-11: qty 1

## 2023-03-11 MED ORDER — SODIUM CHLORIDE 0.9 % IV SOLN
200.0000 mg | Freq: Once | INTRAVENOUS | Status: AC
  Administered 2023-03-11: 200 mg via INTRAVENOUS
  Filled 2023-03-11: qty 200

## 2023-03-11 MED ORDER — SODIUM CHLORIDE 0.9 % IV SOLN
INTRAVENOUS | Status: DC
  Filled 2023-03-11: qty 250

## 2023-03-11 NOTE — Assessment & Plan Note (Signed)
Remote history of right breast cancer 1996 Continue annual screening mammogram.

## 2023-03-11 NOTE — Assessment & Plan Note (Addendum)
B12 level decreased.  recommend Vitamin B12 injection weekly x 4 followed by monthly injection.

## 2023-03-11 NOTE — Progress Notes (Signed)
Hematology/Oncology follow up note [phone] [phone]   Patient Care Team: Tower, Audrie Gallus, MD as PCP - General Debbe Odea, MD as PCP - Cardiology (Cardiology) Vilinda Flake, [facility] (Inactive) as Pharmacist (Pharmacist) Rickard Patience, MD as Consulting Physician (Oncology)  ASSESSMENT & PLAN:   IDA (iron deficiency anemia) Iron deficiency anemia Chronic blood loss vs malabsorption.  She has had GI work up in the past.  Labs reviewed and discussed with patient.   Lab Results  Component Value Date   HGB 9.6 (L) 03/06/2023   TIBC 514 (H) 03/06/2023   IRONPCTSAT 6 (L) 03/06/2023   FERRITIN 6 (L) 03/06/2023   I recommend IV Venofer 200 mg weekly x 5 Recommend GI work up. She will call GI office.  B12 deficiency B12 level decreased.  recommend Vitamin B12 injection weekly x 4 followed by monthly injection.   History of breast cancer Remote history of right breast cancer 1996 Continue annual screening mammogram.   Orders Placed This Encounter  Procedures   CBC with Differential (Cancer Center Only)    Standing Status:   Future    Standing Expiration Date:   03/10/2024   Iron and TIBC    Standing Status:   Future    Standing Expiration Date:   03/10/2024   Ferritin    Standing Status:   Future    Standing Expiration Date:   03/10/2024   Vitamin B12    Standing Status:   Future    Standing Expiration Date:   03/10/2024   Follow up  6 months. Labs prior to MD visit + Venofer and B12 injections.  All questions were answered. The patient knows to call the clinic with any problems, questions or concerns.  Rickard Patience, MD, PhD [facility] Hematology Oncology 03/11/2023    CHIEF COMPLAINTS/REASON FOR VISIT:  Follow-up for iron deficiency anemia  HISTORY OF PRESENTING ILLNESS:  Martha Hamilton is a  72 y.o.  female with PMH listed below who was referred to me for evaluation of anemia Reviewed patient's recent labs that was done.  08/08/20 Labs  revealed anemia with hemoglobin of 9.2, MCV 93 .   Reviewed patient's previous labs ordered by primary care physician's office, anemia is chronic onset , duration is since 2013, with baseline in 11s, worsened during the recent 6 months, after she stops taking oral iron supplementation.   Associated signs and symptoms: Patient reports fatigue. denies SOB with exertion.  Denies weight loss, easy bruising, hematochezia, hemoptysis, hematuria. Context: History of GI bleeding: deneis               History of gastric restriction surgery for GERD                Last colonoscopy: 05/01/21. Last Endoscopy 05/01/21  Remote history of right breast cancer, s/p lumpectomy with node. She denies any previous chemotherapy or any anti estrogen therapy.     Family history positive for brother with esophageal cancer, mother with RCC.  I have recommended genetic testing.  She wanted to defer and if she changes her mind she will update me..   INTERVAL HISTORY Martha Hamilton is a 72 y.o. female who has above history reviewed by me today presents for follow up visit for iron deficiency anemia Patient has been on monthly vitamin B12 injection.  Today she reports feeling well.  Recent left foot surgery. + fatigue. Denies melena or blood in stool.  Review of Systems  Constitutional:  Positive for fatigue.  Negative for appetite change, chills and fever.  HENT:   Negative for hearing loss and voice change.   Eyes:  Negative for eye problems.  Respiratory:  Negative for chest tightness and cough.   Cardiovascular:  Negative for chest pain.  Gastrointestinal:  Negative for abdominal distention, abdominal pain and blood in stool.  Endocrine: Negative for hot flashes.  Genitourinary:  Negative for difficulty urinating and frequency.   Musculoskeletal:  Negative for arthralgias.  Skin:  Negative for itching and rash.  Neurological:  Negative for extremity weakness.  Hematological:  Negative for adenopathy.   Psychiatric/Behavioral:  Negative for confusion.      MEDICAL HISTORY:  Past Medical History:  Diagnosis Date   Allergic rhinitis    Allergy    Asthma    Breast cancer [facility] 1996   right breast   Cataract Oct. 2021   Depression 1981   DM2 (diabetes mellitus, type 2) (HCC)    GERD (gastroesophageal reflux disease)    Hx of cardiovascular stress test    a. Lex MV 2/14:  EF 69%, no ischemia   Hyperlipidemia    Hypertension    IDA (iron deficiency anemia) 08/27/2020   Iron deficiency anemia    Obesity    Osteoarthritis    hands   Ulcer     SURGICAL HISTORY: Past Surgical History:  Procedure Laterality Date   APPENDECTOMY  1976   BREAST LUMPECTOMY Right 1996   right breast, lumpectomy w/nodes   CATARACT EXTRACTION W/PHACO Left 11/28/2020   Procedure: CATARACT EXTRACTION PHACO AND INTRAOCULAR LENS PLACEMENT (IOC) LEFT DIABETIC;  Surgeon: Lockie Mola, MD;  Location: [facility];  Service: Ophthalmology;  Laterality: Left;  5.02 01:07.4 7.4%   CATARACT EXTRACTION W/PHACO Right 12/12/2020   Procedure: CATARACT EXTRACTION PHACO AND INTRAOCULAR LENS PLACEMENT (IOC) RIGHT DIABETIC 5.22 00:59.2;  Surgeon: Lockie Mola, MD;  Location: [facility];  Service: Ophthalmology;  Laterality: Right;   CHONDROPLASTY Left 02/10/2018   Procedure: CHONDROPLASTY;  Surgeon: Lyndle Herrlich, MD;  Location: [facility];  Service: Orthopedics;  Laterality: Left;   EYE SURGERY     FOOT SURGERY Left 07/2022   bone removal   GASTRIC RESTRICTION SURGERY     for reflux   HYSTEROSCOPY WITH D & C N/A 10/18/2012   Procedure: DILATATION AND CURETTAGE /HYSTEROSCOPY;  Surgeon: Catalina Antigua, MD;  Location: [facility];  Service: Gynecology;  Laterality: N/A;   KNEE ARTHROSCOPY WITH MEDIAL MENISECTOMY Left 02/10/2018   Procedure: KNEE ARTHROSCOPY WITH MEDIAL and LATERAL MENISECTOMY;  Surgeon: Lyndle Herrlich, MD;  Location: [facility];  Service: Orthopedics;  Laterality: Left;    POLYPECTOMY N/A 10/18/2012   Procedure: POLYPECTOMY;  Surgeon: Catalina Antigua, MD;  Location: [facility];  Service: Gynecology;  Laterality: N/A;   SYNOVECTOMY Left 02/10/2018   Procedure: SYNOVECTOMY;  Surgeon: Lyndle Herrlich, MD;  Location: [facility];  Service: Orthopedics;  Laterality: Left;    SOCIAL HISTORY: Social History   Socioeconomic History   Marital status: Single    Spouse name: Not on file   Number of children: 0   Years of education: Not on file   Highest education level: Associate degree: occupational, Scientist, product/process development, or vocational program  Occupational History   Occupation: retired    Associate Professor: RETIRED  Tobacco Use   Smoking status: Never   Smokeless tobacco: Never   Tobacco comments:    Never smoked  Vaping Use   Vaping status: Never Used  Substance and Sexual Activity   Alcohol use:  Never   Drug use: Never   Sexual activity: Not Currently    Birth control/protection: Post-menopausal  Other Topics Concern   Not on file  Social History Narrative   Lives with, and cares for her elderly mother.   Social Determinants of Health   Financial Resource Strain: Low Risk  (01/22/2023)   Overall Financial Resource Strain (CARDIA)    Difficulty of Paying Living Expenses: Not hard at all  Food Insecurity: No Food Insecurity (01/22/2023)   Hunger Vital Sign    Worried About Running Out of Food in the Last Year: Never true    Ran Out of Food in the Last Year: Never true  Transportation Needs: No Transportation Needs (01/22/2023)   PRAPARE - Administrator, Civil Service (Medical): No    Lack of Transportation (Non-Medical): No  Physical Activity: Sufficiently Active (01/22/2023)   Exercise Vital Sign    Days of Exercise per Week: 7 days    Minutes of Exercise per Session: 60 min  Recent Concern: Physical Activity - Inactive (11/10/2022)   Exercise Vital Sign    Days of Exercise per Week: 0 days    Minutes of Exercise per Session: 20 min  Stress: No Stress  Concern Present (01/22/2023)   [employer] of Occupational Health - Occupational Stress Questionnaire    Feeling of Stress : Not at all  Social Connections: Moderately Integrated (01/22/2023)   Social Connection and Isolation Panel [NHANES]    Frequency of Communication with Friends and Family: More than three times a week    Frequency of Social Gatherings with Friends and Family: More than three times a week    Attends Religious Services: More than 4 times per year    Active Member of [employer] or Organizations: Yes    Attends Banker Meetings: More than 4 times per year    Marital Status: Never married  Intimate Partner Violence: Not At Risk (01/22/2023)   Humiliation, Afraid, Rape, and Kick questionnaire    Fear of Current or Ex-Partner: No    Emotionally Abused: No    Physically Abused: No    Sexually Abused: No    FAMILY HISTORY: Family History  Problem Relation Age of Onset   Diabetes Mother    Heart disease Mother        Pacemaker, CHF   Hypertension Mother    Kidney cancer Mother    Cancer Mother    Stroke Father    CAD Father 78       Died with MI   Heart disease Father    Esophageal cancer Brother    Diabetes Brother    Heart disease Sister    Cancer Brother    Diabetes Sister    Colon cancer Neg Hx    Rectal cancer Neg Hx    Stomach cancer Neg Hx    Pancreatic cancer Neg Hx     ALLERGIES:  is allergic to cholestyramine, simvastatin, lipitor [atorvastatin], and niacin.  MEDICATIONS:  Current Outpatient Medications  Medication Sig Dispense Refill   ACCU-CHEK AVIVA PLUS test strip TEST BLOOD SUGAR EVERY DAY FOR DIABETES 100 strip 3   Accu-Chek Softclix Lancets lancets TEST BLOOD SUGAR EVERY DAY FOR DIABETES 100 each 1   acetaminophen (TYLENOL) 500 MG tablet Take 1,000 mg by mouth every 6 (six) hours as needed for mild pain.      Alcohol Swabs (DROPSAFE ALCOHOL PREP) 70 % PADS USE ONE TIME DAILY AS DIRECTED WHEN CHECKING BLOOD SUGAR 100  each 1    amitriptyline (ELAVIL) 25 MG tablet TAKE 1 TABLET AT BEDTIME 90 tablet 0   aspirin 81 MG tablet Take 81 mg by mouth at bedtime.      Bempedoic Acid (NEXLETOL) 180 MG TABS Take 1 tablet (180 mg total) by mouth daily. 30 tablet 2   Blood Glucose Calibration (ACCU-CHEK AVIVA) SOLN USE TO CALIBRATE METER AS DIRECTED 1 each 2   Blood Glucose Monitoring Suppl (ACCU-CHEK AVIVA PLUS) w/Device KIT Use to check blood sugar once daily for DM (dx. E11.9) 1 kit 0   calcium elemental as carbonate (BARIATRIC TUMS ULTRA) 400 MG chewable tablet Chew 1,000 mg by mouth daily as needed for heartburn.     Camphor-Menthol-Methyl Sal (SALONPAS) 3.07-19-08 % PTCH Apply topically.     Cholecalciferol (VITAMIN D) 2000 units tablet Take 2,000 Units by mouth daily.     Docusate Sodium (DSS) 100 MG CAPS Take 1 capsule by mouth 2 (two) times daily as needed.     ezetimibe (ZETIA) 10 MG tablet Take 1 tablet (10 mg total) by mouth daily. 30 tablet 1   fenofibrate (TRICOR) 145 MG tablet TAKE 1 TABLET EVERY DAY 90 tablet 1   gabapentin (NEURONTIN) 300 MG capsule 300 mg 3 (three) times daily as needed.     glipiZIDE (GLUCOTROL XL) 5 MG 24 hr tablet TAKE 1 TABLET EVERY DAY WITH BREAKFAST 90 tablet 1   loratadine (CLARITIN) 10 MG tablet Take 10 mg by mouth daily as needed for allergies.     losartan (COZAAR) 100 MG tablet Take 1 tablet (100 mg total) by mouth daily. 90 tablet 3   meclizine (ANTIVERT) 25 MG tablet Take 1 tablet (25 mg total) by mouth 3 (three) times daily as needed for dizziness. 90 tablet 3   metFORMIN (GLUCOPHAGE) 1000 MG tablet TAKE 1/2 TABLET TWICE DAILY WITH MEALS 90 tablet 1   metoprolol succinate (TOPROL-XL) 50 MG 24 hr tablet Take 1 tablet (50 mg) by mouth TWICE daily. Take with or immediately following a meal. 180 tablet 3   omeprazole (PRILOSEC) 40 MG capsule TAKE 1 CAPSULE IN THE MORNING AND AT BEDTIME. 180 capsule 3   ondansetron (ZOFRAN) 4 MG tablet TAKE 1 TABLET EVERY 4 HOURS AS NEEDED FOR NAUSEA AND  VOMITING 90 tablet 0   SALINE NASAL SPRAY NA Place 1 spray into the nose daily as needed (congestion).      No current facility-administered medications for this visit.   Facility-Administered Medications Ordered in Other Visits  Medication Dose Route Frequency Provider Last Rate Last Admin   0.9 %  sodium chloride infusion   Intravenous Continuous Rickard Patience, MD   Stopped at 03/11/23 1434     PHYSICAL EXAMINATION: ECOG PERFORMANCE STATUS: 1 - Symptomatic but completely ambulatory Vitals:   03/11/23 1331  BP: (!) 140/67  Pulse: 91  Resp: 18  Temp: 98.2 F (36.8 C)   Filed Weights   03/11/23 1331  Weight: 175 lb 14.4 oz (79.8 kg)    Physical Exam Constitutional:      General: She is not in acute distress. HENT:     Head: Normocephalic and atraumatic.  Eyes:     General: No scleral icterus. Cardiovascular:     Rate and Rhythm: Normal rate and regular rhythm.     Heart sounds: Normal heart sounds.  Pulmonary:     Effort: Pulmonary effort is normal. No respiratory distress.     Breath sounds: No wheezing.  Abdominal:     General:  Bowel sounds are normal. There is no distension.     Palpations: Abdomen is soft.  Musculoskeletal:        General: No deformity. Normal range of motion.     Cervical back: Normal range of motion and neck supple.  Skin:    General: Skin is warm and dry.     Findings: No erythema or rash.  Neurological:     Mental Status: She is alert and oriented to person, place, and time. Mental status is at baseline.     Cranial Nerves: No cranial nerve deficit.     Coordination: Coordination normal.  Psychiatric:        Mood and Affect: Mood normal.      LABORATORY DATA:  I have reviewed the data as listed    Latest Ref Rng & Units 03/06/2023   11:35 AM 01/19/2023    7:53 AM 09/08/2022    2:04 PM  CBC  WBC 4.0 - 10.5 K/uL 7.6  6.6  5.7   Hemoglobin 12.0 - 15.0 g/dL 9.6  9.8  95.6   Hematocrit 36.0 - 46.0 % 31.8  31.3  34.6   Platelets 150 - 400  K/uL 436  433.0  351       Latest Ref Rng & Units 03/06/2023   11:35 AM 01/19/2023    7:53 AM 09/08/2022    2:04 PM  CMP  Glucose 70 - 99 mg/dL 387  564  332   BUN 8 - 23 mg/dL 27  23  21    Creatinine 0.44 - 1.00 mg/dL 9.51  8.84  1.66   Sodium 135 - 145 mmol/L 137  138  139   Potassium 3.5 - 5.1 mmol/L 4.0  4.4  4.4   Chloride 98 - 111 mmol/L 107  102  100   CO2 22 - 32 mmol/L 21  26  24    Calcium 8.9 - 10.3 mg/dL 9.3  9.0  9.1   Total Protein 6.5 - 8.1 g/dL 7.1  6.9  6.5   Total Bilirubin 0.3 - 1.2 mg/dL 0.3  0.2  0.7   Alkaline Phos 38 - 126 U/L 36  33  48   AST 15 - 41 U/L 19  13  33   ALT 0 - 44 U/L 13  11  17       Iron/TIBC/Ferritin/ %Sat    Component Value Date/Time   IRON 32 03/06/2023 1135   TIBC 514 (H) 03/06/2023 1135   FERRITIN 6 (L) 03/06/2023 1135   IRONPCTSAT 6 (L) 03/06/2023 1135

## 2023-03-11 NOTE — Assessment & Plan Note (Addendum)
Iron deficiency anemia Chronic blood loss vs malabsorption.  She has had GI work up in the past.  Labs reviewed and discussed with patient.   Lab Results  Component Value Date   HGB 9.6 (L) 03/06/2023   TIBC 514 (H) 03/06/2023   IRONPCTSAT 6 (L) 03/06/2023   FERRITIN 6 (L) 03/06/2023   I recommend IV Venofer 200 mg weekly x 5 Recommend GI work up. She will call GI office.

## 2023-03-19 ENCOUNTER — Inpatient Hospital Stay: Payer: Medicare PPO | Attending: Internal Medicine

## 2023-03-19 ENCOUNTER — Ambulatory Visit: Payer: Medicare PPO | Attending: Cardiology | Admitting: Cardiology

## 2023-03-19 ENCOUNTER — Encounter: Payer: Self-pay | Admitting: Cardiology

## 2023-03-19 VITALS — BP 139/56 | HR 85 | Temp 99.4°F | Resp 20

## 2023-03-19 VITALS — BP 148/78 | HR 92 | Ht 59.0 in | Wt 173.8 lb

## 2023-03-19 DIAGNOSIS — D508 Other iron deficiency anemias: Secondary | ICD-10-CM

## 2023-03-19 DIAGNOSIS — E538 Deficiency of other specified B group vitamins: Secondary | ICD-10-CM | POA: Insufficient documentation

## 2023-03-19 DIAGNOSIS — D509 Iron deficiency anemia, unspecified: Secondary | ICD-10-CM | POA: Diagnosis not present

## 2023-03-19 DIAGNOSIS — Z23 Encounter for immunization: Secondary | ICD-10-CM | POA: Insufficient documentation

## 2023-03-19 DIAGNOSIS — Z853 Personal history of malignant neoplasm of breast: Secondary | ICD-10-CM | POA: Insufficient documentation

## 2023-03-19 DIAGNOSIS — I10 Essential (primary) hypertension: Secondary | ICD-10-CM

## 2023-03-19 DIAGNOSIS — Z6835 Body mass index (BMI) 35.0-35.9, adult: Secondary | ICD-10-CM | POA: Diagnosis not present

## 2023-03-19 DIAGNOSIS — E781 Pure hyperglyceridemia: Secondary | ICD-10-CM | POA: Diagnosis not present

## 2023-03-19 MED ORDER — CYANOCOBALAMIN 1000 MCG/ML IJ SOLN
1000.0000 ug | Freq: Once | INTRAMUSCULAR | Status: AC
  Administered 2023-03-19: 1000 ug via INTRAMUSCULAR
  Filled 2023-03-19: qty 1

## 2023-03-19 MED ORDER — SODIUM CHLORIDE 0.9 % IV SOLN
Freq: Once | INTRAVENOUS | Status: AC
  Filled 2023-03-19: qty 250

## 2023-03-19 MED ORDER — SODIUM CHLORIDE 0.9 % IV SOLN
200.0000 mg | Freq: Once | INTRAVENOUS | Status: AC
  Administered 2023-03-19: 200 mg via INTRAVENOUS
  Filled 2023-03-19: qty 200

## 2023-03-19 MED ORDER — METOPROLOL SUCCINATE ER 100 MG PO TB24: 100.0000 mg | ORAL_TABLET | Freq: Every day | ORAL | 0 refills | Status: DC

## 2023-03-19 NOTE — Progress Notes (Signed)
Cardiology Office Note:    Date:  03/19/2023   ID:  Martha Hamilton, DOB March 02, 1951, MRN [id number]  PCP:  [name], MD   [facility] Providers Cardiologist:  Debbe Odea, MD     Referring MD: [name], MD   Chief Complaint  Patient presents with   Follow-up    History of Present Illness:    Martha Hamilton is a 72 y.o. female with a hx of hypertension, hyperlipidemia, diabetes, CKD 3 presenting for follow-up.   Previously seen for elevated heart rates, started on metoprolol with good effect.  BP at home ranges in the 130s systolic.  Heart rates are better controlled.  No new concerns today.   Prior notes Echocardiogram 02/11/2022 EF 60 to 65% Cardiac monitor 02/05/2022 normal, no arrhythmias to suggest etiology of syncope.   Past Medical History:  Diagnosis Date   Allergic rhinitis    Allergy    Asthma    Breast cancer [facility]) 1996   right breast   Cataract Oct. 2021   Depression 1981   DM2 (diabetes mellitus, type 2) (HCC)    GERD (gastroesophageal reflux disease)    Hx of cardiovascular stress test    a. Lex MV 2/14:  EF 69%, no ischemia   Hyperlipidemia    Hypertension    IDA (iron deficiency anemia) 08/27/2020   Iron deficiency anemia    Obesity    Osteoarthritis    hands   Ulcer     Past Surgical History:  Procedure Laterality Date   APPENDECTOMY  1976   BREAST LUMPECTOMY Right 1996   right breast, lumpectomy w/nodes   CATARACT EXTRACTION W/PHACO Left 11/28/2020   Procedure: CATARACT EXTRACTION PHACO AND INTRAOCULAR LENS PLACEMENT (IOC) LEFT DIABETIC;  Surgeon: Lockie Mola, MD;  Location: [facility];  Service: Ophthalmology;  Laterality: Left;  5.02 01:07.4 7.4%   CATARACT EXTRACTION W/PHACO Right 12/12/2020   Procedure: CATARACT EXTRACTION PHACO AND INTRAOCULAR LENS PLACEMENT (IOC) RIGHT DIABETIC 5.22 00:59.2;  Surgeon: Lockie Mola, MD;  Location: [facility];  Service: Ophthalmology;   Laterality: Right;   CHONDROPLASTY Left 02/10/2018   Procedure: CHONDROPLASTY;  Surgeon: Lyndle Herrlich, MD;  Location: [facility];  Service: Orthopedics;  Laterality: Left;   EYE SURGERY     FOOT SURGERY Left 07/2022   bone removal   GASTRIC RESTRICTION SURGERY     for reflux   HYSTEROSCOPY WITH D & C N/A 10/18/2012   Procedure: DILATATION AND CURETTAGE /HYSTEROSCOPY;  Surgeon: Catalina Antigua, MD;  Location: [facility];  Service: Gynecology;  Laterality: N/A;   KNEE ARTHROSCOPY WITH MEDIAL MENISECTOMY Left 02/10/2018   Procedure: KNEE ARTHROSCOPY WITH MEDIAL and LATERAL MENISECTOMY;  Surgeon: Lyndle Herrlich, MD;  Location: [facility];  Service: Orthopedics;  Laterality: Left;   POLYPECTOMY N/A 10/18/2012   Procedure: POLYPECTOMY;  Surgeon: Catalina Antigua, MD;  Location: [facility];  Service: Gynecology;  Laterality: N/A;   SYNOVECTOMY Left 02/10/2018   Procedure: SYNOVECTOMY;  Surgeon: Lyndle Herrlich, MD;  Location: [facility];  Service: Orthopedics;  Laterality: Left;    Current Medications: Current Meds  Medication Sig   ACCU-CHEK AVIVA PLUS test strip TEST BLOOD SUGAR EVERY DAY FOR DIABETES   Accu-Chek Softclix Lancets lancets TEST BLOOD SUGAR EVERY DAY FOR DIABETES   acetaminophen (TYLENOL) 500 MG tablet Take 1,000 mg by mouth every 6 (six) hours as needed for mild pain.    Alcohol Swabs (DROPSAFE ALCOHOL PREP) 70 % PADS USE ONE TIME  DAILY AS DIRECTED WHEN CHECKING BLOOD SUGAR   amitriptyline (ELAVIL) 25 MG tablet TAKE 1 TABLET AT BEDTIME   aspirin 81 MG tablet Take 81 mg by mouth at bedtime.    Bempedoic Acid (NEXLETOL) 180 MG TABS Take 1 tablet (180 mg total) by mouth daily.   Blood Glucose Calibration (ACCU-CHEK AVIVA) SOLN USE TO CALIBRATE METER AS DIRECTED   Blood Glucose Monitoring Suppl (ACCU-CHEK AVIVA PLUS) w/Device KIT Use to check blood sugar once daily for DM (dx. E11.9)   calcium elemental as carbonate (BARIATRIC TUMS ULTRA) 400 MG chewable tablet Chew 1,000 mg by mouth daily as  needed for heartburn.   Camphor-Menthol-Methyl Sal (SALONPAS) 3.07-19-08 % PTCH Apply topically.   Cholecalciferol (VITAMIN D) 2000 units tablet Take 2,000 Units by mouth daily.   Docusate Sodium (DSS) 100 MG CAPS Take 1 capsule by mouth 2 (two) times daily as needed.   ezetimibe (ZETIA) 10 MG tablet Take 1 tablet (10 mg total) by mouth daily.   fenofibrate (TRICOR) 145 MG tablet TAKE 1 TABLET EVERY DAY   gabapentin (NEURONTIN) 300 MG capsule 300 mg 3 (three) times daily as needed.   glipiZIDE (GLUCOTROL XL) 5 MG 24 hr tablet TAKE 1 TABLET EVERY DAY WITH BREAKFAST   loratadine (CLARITIN) 10 MG tablet Take 10 mg by mouth daily as needed for allergies.   losartan (COZAAR) 100 MG tablet Take 1 tablet (100 mg total) by mouth daily.   meclizine (ANTIVERT) 25 MG tablet Take 1 tablet (25 mg total) by mouth 3 (three) times daily as needed for dizziness.   metFORMIN (GLUCOPHAGE) 1000 MG tablet TAKE 1/2 TABLET TWICE DAILY WITH MEALS   omeprazole (PRILOSEC) 40 MG capsule TAKE 1 CAPSULE IN THE MORNING AND AT BEDTIME.   ondansetron (ZOFRAN) 4 MG tablet TAKE 1 TABLET EVERY 4 HOURS AS NEEDED FOR NAUSEA AND VOMITING   SALINE NASAL SPRAY NA Place 1 spray into the nose daily as needed (congestion).    [DISCONTINUED] metoprolol succinate (TOPROL-XL) 50 MG 24 hr tablet Take 1 tablet (50 mg) by mouth TWICE daily. Take with or immediately following a meal.     Allergies:   Cholestyramine, Simvastatin, Lipitor [atorvastatin], and Niacin   Social History   Socioeconomic History   Marital status: Single    Spouse name: Not on file   Number of children: 0   Years of education: Not on file   Highest education level: Associate degree: occupational, Scientist, product/process development, or vocational program  Occupational History   Occupation: retired    Associate Professor: RETIRED  Tobacco Use   Smoking status: Never   Smokeless tobacco: Never   Tobacco comments:    Never smoked  Vaping Use   Vaping status: Never Used  Substance and Sexual  Activity   Alcohol use: Never   Drug use: Never   Sexual activity: Not Currently    Birth control/protection: Post-menopausal  Other Topics Concern   Not on file  Social History Narrative   Lives with, and cares for her elderly mother.   Social Determinants of Health   Financial Resource Strain: Low Risk  (01/22/2023)   Overall Financial Resource Strain (CARDIA)    Difficulty of Paying Living Expenses: Not hard at all  Food Insecurity: No Food Insecurity (01/22/2023)   Hunger Vital Sign    Worried About Running Out of Food in the Last Year: Never true    Ran Out of Food in the Last Year: Never true  Transportation Needs: No Transportation Needs (01/22/2023)   PRAPARE -  Administrator, Civil Service (Medical): No    Lack of Transportation (Non-Medical): No  Physical Activity: Sufficiently Active (01/22/2023)   Exercise Vital Sign    Days of Exercise per Week: 7 days    Minutes of Exercise per Session: 60 min  Recent Concern: Physical Activity - Inactive (11/10/2022)   Exercise Vital Sign    Days of Exercise per Week: 0 days    Minutes of Exercise per Session: 20 min  Stress: No Stress Concern Present (01/22/2023)   [employer] of Occupational Health - Occupational Stress Questionnaire    Feeling of Stress : Not at all  Social Connections: Moderately Integrated (01/22/2023)   Social Connection and Isolation Panel [NHANES]    Frequency of Communication with Friends and Family: More than three times a week    Frequency of Social Gatherings with Friends and Family: More than three times a week    Attends Religious Services: More than 4 times per year    Active Member of [employer] or Organizations: Yes    Attends Engineer, structural: More than 4 times per year    Marital Status: Never married     Family History: The patient's family history includes CAD (age of onset: 13) in her father; Cancer in her brother and mother; Diabetes in her brother, mother, and  sister; Esophageal cancer in her brother; Heart disease in her father, mother, and sister; Hypertension in her mother; Kidney cancer in her mother; Stroke in her father. There is no history of Colon cancer, Rectal cancer, Stomach cancer, or Pancreatic cancer.  ROS:   Please see the history of present illness.     All other systems reviewed and are negative.  EKGs/Labs/Other Studies Reviewed:    The following studies were reviewed today:   EKG:  EKG not ordered today.    Recent Labs: 01/19/2023: TSH 0.69 03/06/2023: ALT 13; BUN 27; Creatinine 1.06; Hemoglobin 9.6; Platelet Count 436; Potassium 4.0; Sodium 137  Recent Lipid Panel    Component Value Date/Time   CHOL 148 03/06/2023 1135   TRIG 308 (H) 03/06/2023 1135   HDL 39 (L) 03/06/2023 1135   CHOLHDL 3.8 03/06/2023 1135   VLDL 62 (H) 03/06/2023 1135   LDLCALC 47 03/06/2023 1135   LDLDIRECT 80.0 01/19/2023 0753     Risk Assessment/Calculations:         Physical Exam:    VS:  BP (!) 148/78 (BP Location: Left Arm, Patient Position: Sitting, Cuff Size: Large)   Pulse 92   Ht 4\' 11"  (1.499 m)   Wt 173 lb 12.8 oz (78.8 kg)   LMP 06/13/2001   SpO2 97%   BMI 35.10 kg/m     Wt Readings from Last 3 Encounters:  03/19/23 173 lb 12.8 oz (78.8 kg)  03/11/23 175 lb 14.4 oz (79.8 kg)  02/03/23 177 lb 2 oz (80.3 kg)     GEN:  Well nourished, well developed in no acute distress HEENT: Normal NECK: No JVD; No carotid bruits CARDIAC: RRR, no murmurs, rubs, gallops RESPIRATORY:  Clear to auscultation without rales, wheezing or rhonchi  ABDOMEN: Soft, non-tender, non-distended MUSCULOSKELETAL:  No edema; No deformity  SKIN: Warm and dry NEUROLOGIC:  Alert and oriented x 3 PSYCHIATRIC:  Normal affect   ASSESSMENT:    1. Primary hypertension   2. High triglycerides   3. BMI 35.0-35.9,adult    PLAN:    In order of problems listed above:  Hypertension, BP improved, controlled, not tachycardic.  Increase Toprol-XL to 100 mg  daily, continue losartan 100 mg daily.   Elevated triglycerides, statin intolerant.  Continue Zetia, bempedoic acid, fenofibrate.  Low-cholesterol diet emphasized. Obesity, continue low-cholesterol, low calorie diet.  Follow-up in 3 months.     Medication Adjustments/Labs and Tests Ordered: Current medicines are reviewed at length with the patient today.  Concerns regarding medicines are outlined above.  No orders of the defined types were placed in this encounter.  Meds ordered this encounter  Medications   metoprolol succinate (TOPROL-XL) 100 MG 24 hr tablet    Sig: Take 1 tablet (100 mg total) by mouth daily.    Dispense:  90 tablet    Refill:  0    Patient Instructions  Medication Instructions:   INCREASE Metoprolol - Take one tablet ( 100mg ) by mouth daily.   - If you have any extra of the 50 MG tablets you can take once tablet ( 50mg ) by mouth twice a day until you run out.   *If you need a refill on your cardiac medications before your next appointment, please call your pharmacy*   Lab Work:  None Ordered  If you have labs (blood work) drawn today and your tests are completely normal, you will receive your results only by: MyChart Message (if you have MyChart) OR A paper copy in the mail If you have any lab test that is abnormal or we need to change your treatment, we will call you to review the results.   Testing/Procedures:  None Ordered   Follow-Up: At [facility], you and your health needs are our priority.  As part of our continuing mission to provide you with exceptional heart care, we have created designated Provider Care Teams.  These Care Teams include your primary Cardiologist (physician) and Advanced Practice Providers (APPs -  Physician Assistants and Nurse Practitioners) who all work together to provide you with the care you need, when you need it.  We recommend signing up for the patient portal called "MyChart".  Sign up information is  provided on this After Visit Summary.  MyChart is used to connect with patients for Virtual Visits (Telemedicine).  Patients are able to view lab/test results, encounter notes, upcoming appointments, etc.  Non-urgent messages can be sent to your provider as well.   To learn more about what you can do with MyChart, go to [email or web address].    Your next appointment:   3 month(s)  Provider:   You may see Debbe Odea, MD or one of the following Advanced Practice Providers on your designated Care Team:   Nicolasa Ducking, NP Eula Listen, PA-C Cadence Fransico Michael, PA-C Charlsie Quest, NP    Signed, Debbe Odea, MD  03/19/2023 11:12 AM    [facility]

## 2023-03-19 NOTE — Patient Instructions (Signed)
Medication Instructions:   INCREASE Metoprolol - Take one tablet ( 100mg ) by mouth daily.   - If you have any extra of the 50 MG tablets you can take once tablet ( 50mg ) by mouth twice a day until you run out.   *If you need a refill on your cardiac medications before your next appointment, please call your pharmacy*   Lab Work:  None Ordered  If you have labs (blood work) drawn today and your tests are completely normal, you will receive your results only by: MyChart Message (if you have MyChart) OR A paper copy in the mail If you have any lab test that is abnormal or we need to change your treatment, we will call you to review the results.   Testing/Procedures:  None Ordered   Follow-Up: At [facility], you and your health needs are our priority.  As part of our continuing mission to provide you with exceptional heart care, we have created designated Provider Care Teams.  These Care Teams include your primary Cardiologist (physician) and Advanced Practice Providers (APPs -  Physician Assistants and Nurse Practitioners) who all work together to provide you with the care you need, when you need it.  We recommend signing up for the patient portal called "MyChart".  Sign up information is provided on this After Visit Summary.  MyChart is used to connect with patients for Virtual Visits (Telemedicine).  Patients are able to view lab/test results, encounter notes, upcoming appointments, etc.  Non-urgent messages can be sent to your provider as well.   To learn more about what you can do with MyChart, go to [email or web address].    Your next appointment:   3 month(s)  Provider:   You may see Debbe Odea, MD or one of the following Advanced Practice Providers on your designated Care Team:   Nicolasa Ducking, NP Eula Listen, PA-C Cadence Fransico Michael, PA-C Charlsie Quest, NP

## 2023-03-20 ENCOUNTER — Telehealth: Payer: Self-pay | Admitting: Cardiology

## 2023-03-20 NOTE — Telephone Encounter (Signed)
Returned the call to the patient. The patient stated that Metoprolol Succinate was increased to 100 mg once daily at her last office visit. The patient was taking 50 mg bid of the Succinate previously. She would like to know if the medication should be increased or if she should stay on the 100 mg

## 2023-03-20 NOTE — Telephone Encounter (Signed)
Pt c/o medication issue:  1. Name of Medication:   metoprolol succinate (TOPROL-XL) 100 MG 24 hr tablet   2. How are you currently taking this medication (dosage and times per day)? Patient stated she is taking one 50 mg tablet twice daily  3. Are you having a reaction (difficulty breathing--STAT)?   No  4. What is your medication issue?    Patient stated at her last visit she was to take 50 mg twice daily and she has already been taking this dosage.  Patient stated she was to increase taking 100 mg once daily and wants to confirm this medication change.

## 2023-03-23 NOTE — Telephone Encounter (Signed)
Spoke with patient.  Reviewed Dr. Amaryllis Dyke recommendations as follows:  Continue Toprol-XL 100 mg daily.   Patient was very thankful for the call.

## 2023-03-26 ENCOUNTER — Inpatient Hospital Stay: Payer: Medicare PPO

## 2023-03-26 VITALS — BP 151/75 | HR 90 | Temp 96.9°F | Resp 19

## 2023-03-26 DIAGNOSIS — D508 Other iron deficiency anemias: Secondary | ICD-10-CM

## 2023-03-26 DIAGNOSIS — E538 Deficiency of other specified B group vitamins: Secondary | ICD-10-CM | POA: Diagnosis not present

## 2023-03-26 DIAGNOSIS — Z853 Personal history of malignant neoplasm of breast: Secondary | ICD-10-CM | POA: Diagnosis not present

## 2023-03-26 DIAGNOSIS — D509 Iron deficiency anemia, unspecified: Secondary | ICD-10-CM | POA: Diagnosis not present

## 2023-03-26 DIAGNOSIS — Z23 Encounter for immunization: Secondary | ICD-10-CM | POA: Diagnosis not present

## 2023-03-26 MED ORDER — SODIUM CHLORIDE 0.9 % IV SOLN
200.0000 mg | Freq: Once | INTRAVENOUS | Status: AC
  Administered 2023-03-26: 200 mg via INTRAVENOUS
  Filled 2023-03-26: qty 200

## 2023-03-26 MED ORDER — CYANOCOBALAMIN 1000 MCG/ML IJ SOLN
1000.0000 ug | Freq: Once | INTRAMUSCULAR | Status: AC
  Administered 2023-03-26: 1000 ug via INTRAMUSCULAR
  Filled 2023-03-26: qty 1

## 2023-03-26 MED ORDER — SODIUM CHLORIDE 0.9 % IV SOLN
INTRAVENOUS | Status: DC
  Filled 2023-03-26: qty 250

## 2023-03-26 MED ORDER — INFLUENZA VAC A&B SURF ANT ADJ 0.5 ML IM SUSY
0.5000 mL | PREFILLED_SYRINGE | Freq: Once | INTRAMUSCULAR | Status: AC
  Administered 2023-03-26: 0.5 mL via INTRAMUSCULAR
  Filled 2023-03-26: qty 0.5

## 2023-04-02 ENCOUNTER — Inpatient Hospital Stay: Payer: Medicare PPO

## 2023-04-02 VITALS — BP 145/77 | HR 90 | Temp 97.0°F

## 2023-04-02 DIAGNOSIS — Z23 Encounter for immunization: Secondary | ICD-10-CM | POA: Diagnosis not present

## 2023-04-02 DIAGNOSIS — D509 Iron deficiency anemia, unspecified: Secondary | ICD-10-CM | POA: Diagnosis not present

## 2023-04-02 DIAGNOSIS — E538 Deficiency of other specified B group vitamins: Secondary | ICD-10-CM | POA: Diagnosis not present

## 2023-04-02 DIAGNOSIS — Z853 Personal history of malignant neoplasm of breast: Secondary | ICD-10-CM | POA: Diagnosis not present

## 2023-04-02 DIAGNOSIS — D508 Other iron deficiency anemias: Secondary | ICD-10-CM

## 2023-04-02 MED ORDER — SODIUM CHLORIDE 0.9 % IV SOLN
200.0000 mg | Freq: Once | INTRAVENOUS | Status: AC
  Administered 2023-04-02: 200 mg via INTRAVENOUS
  Filled 2023-04-02: qty 200

## 2023-04-02 MED ORDER — CYANOCOBALAMIN 1000 MCG/ML IJ SOLN
1000.0000 ug | Freq: Once | INTRAMUSCULAR | Status: AC
  Administered 2023-04-02: 1000 ug via INTRAMUSCULAR
  Filled 2023-04-02: qty 1

## 2023-04-02 MED ORDER — SODIUM CHLORIDE 0.9 % IV SOLN
Freq: Once | INTRAVENOUS | Status: AC
  Filled 2023-04-02: qty 250

## 2023-04-02 NOTE — Patient Instructions (Signed)
Iron Sucrose Injection What is this medication? IRON SUCROSE (EYE ern SOO krose) treats low levels of iron (iron deficiency anemia) in people with kidney disease. Iron is a mineral that plays an important role in making red blood cells, which carry oxygen from your lungs to the rest of your body. This medicine may be used for other purposes; ask your health care provider or pharmacist if you have questions. COMMON BRAND NAME(S): Venofer What should I tell my care team before I take this medication? They need to know if you have any of these conditions: Anemia not caused by low iron levels Heart disease High levels of iron in the blood Kidney disease Liver disease An unusual or allergic reaction to iron, other medications, foods, dyes, or preservatives Pregnant or trying to get pregnant Breastfeeding How should I use this medication? This medication is for infusion into a vein. It is given in a hospital or clinic setting. Talk to your care team about the use of this medication in children. While this medication may be prescribed for children as young as 2 years for selected conditions, precautions do apply. Overdosage: If you think you have taken too much of this medicine contact a poison control center or emergency room at once. NOTE: This medicine is only for you. Do not share this medicine with others. What if I miss a dose? Keep appointments for follow-up doses. It is important not to miss your dose. Call your care team if you are unable to keep an appointment. What may interact with this medication? Do not take this medication with any of the following: Deferoxamine Dimercaprol Other iron products This medication may also interact with the following: Chloramphenicol Deferasirox This list may not describe all possible interactions. Give your health care provider a list of all the medicines, herbs, non-prescription drugs, or dietary supplements you use. Also tell them if you smoke,  drink alcohol, or use illegal drugs. Some items may interact with your medicine. What should I watch for while using this medication? Visit your care team regularly. Tell your care team if your symptoms do not start to get better or if they get worse. You may need blood work done while you are taking this medication. You may need to follow a special diet. Talk to your care team. Foods that contain iron include: whole grains/cereals, dried fruits, beans, or peas, leafy green vegetables, and organ meats (liver, kidney). What side effects may I notice from receiving this medication? Side effects that you should report to your care team as soon as possible: Allergic reactions--skin rash, itching, hives, swelling of the face, lips, tongue, or throat Low blood pressure--dizziness, feeling faint or lightheaded, blurry vision Shortness of breath Side effects that usually do not require medical attention (report to your care team if they continue or are bothersome): Flushing Headache Joint pain Muscle pain Nausea Pain, redness, or irritation at injection site This list may not describe all possible side effects. Call your doctor for medical advice about side effects. You may report side effects to FDA at 1-800-FDA-1088. Where should I keep my medication? This medication is given in a hospital or clinic. It will not be stored at home. NOTE: This sheet is a summary. It may not cover all possible information. If you have questions about this medicine, talk to your doctor, pharmacist, or health care provider.  2024 Elsevier/Gold Standard (2022-12-05 00:00:00)

## 2023-04-09 ENCOUNTER — Inpatient Hospital Stay: Payer: Medicare PPO

## 2023-04-09 VITALS — BP 153/76 | HR 89 | Temp 99.3°F | Resp 16

## 2023-04-09 DIAGNOSIS — D509 Iron deficiency anemia, unspecified: Secondary | ICD-10-CM | POA: Diagnosis not present

## 2023-04-09 DIAGNOSIS — D508 Other iron deficiency anemias: Secondary | ICD-10-CM

## 2023-04-09 DIAGNOSIS — Z853 Personal history of malignant neoplasm of breast: Secondary | ICD-10-CM | POA: Diagnosis not present

## 2023-04-09 DIAGNOSIS — E538 Deficiency of other specified B group vitamins: Secondary | ICD-10-CM | POA: Diagnosis not present

## 2023-04-09 DIAGNOSIS — Z23 Encounter for immunization: Secondary | ICD-10-CM | POA: Diagnosis not present

## 2023-04-09 MED ORDER — SODIUM CHLORIDE 0.9 % IV SOLN
Freq: Once | INTRAVENOUS | Status: AC
  Filled 2023-04-09: qty 250

## 2023-04-09 MED ORDER — SODIUM CHLORIDE 0.9 % IV SOLN
200.0000 mg | Freq: Once | INTRAVENOUS | Status: AC
  Administered 2023-04-09: 200 mg via INTRAVENOUS
  Filled 2023-04-09: qty 200

## 2023-04-16 ENCOUNTER — Other Ambulatory Visit: Payer: Self-pay | Admitting: Family Medicine

## 2023-04-21 ENCOUNTER — Other Ambulatory Visit: Payer: Self-pay

## 2023-04-21 MED ORDER — EZETIMIBE 10 MG PO TABS: 10.0000 mg | ORAL_TABLET | Freq: Every day | ORAL | 0 refills | Status: DC

## 2023-04-21 NOTE — Telephone Encounter (Signed)
Requested Prescriptions   Signed Prescriptions Disp Refills   ezetimibe (ZETIA) 10 MG tablet 90 tablet 0    Sig: Take 1 tablet (10 mg total) by mouth daily.    Authorizing Provider: Debbe Odea    Ordering User: Guerry Minors

## 2023-04-27 ENCOUNTER — Inpatient Hospital Stay: Payer: Medicare PPO | Attending: Internal Medicine

## 2023-04-27 DIAGNOSIS — E538 Deficiency of other specified B group vitamins: Secondary | ICD-10-CM | POA: Insufficient documentation

## 2023-04-27 DIAGNOSIS — D509 Iron deficiency anemia, unspecified: Secondary | ICD-10-CM | POA: Insufficient documentation

## 2023-04-27 DIAGNOSIS — D508 Other iron deficiency anemias: Secondary | ICD-10-CM

## 2023-04-27 DIAGNOSIS — Z853 Personal history of malignant neoplasm of breast: Secondary | ICD-10-CM | POA: Diagnosis not present

## 2023-04-27 MED ORDER — CYANOCOBALAMIN 1000 MCG/ML IJ SOLN
1000.0000 ug | Freq: Once | INTRAMUSCULAR | Status: AC
  Administered 2023-04-27: 1000 ug via INTRAMUSCULAR
  Filled 2023-04-27: qty 1

## 2023-05-06 ENCOUNTER — Ambulatory Visit: Payer: Medicare PPO | Admitting: Family Medicine

## 2023-05-06 ENCOUNTER — Encounter: Payer: Self-pay | Admitting: Family Medicine

## 2023-05-06 VITALS — BP 140/77 | HR 78 | Temp 97.8°F | Wt 169.0 lb

## 2023-05-06 DIAGNOSIS — Z7984 Long term (current) use of oral hypoglycemic drugs: Secondary | ICD-10-CM | POA: Diagnosis not present

## 2023-05-06 DIAGNOSIS — I10 Essential (primary) hypertension: Secondary | ICD-10-CM

## 2023-05-06 DIAGNOSIS — E1169 Type 2 diabetes mellitus with other specified complication: Secondary | ICD-10-CM

## 2023-05-06 DIAGNOSIS — E785 Hyperlipidemia, unspecified: Secondary | ICD-10-CM | POA: Diagnosis not present

## 2023-05-06 DIAGNOSIS — E119 Type 2 diabetes mellitus without complications: Secondary | ICD-10-CM

## 2023-05-06 LAB — POCT GLYCOSYLATED HEMOGLOBIN (HGB A1C): Hemoglobin A1C: 6 % — AB (ref 4.0–5.6)

## 2023-05-06 MED ORDER — GABAPENTIN 300 MG PO CAPS: 300.0000 mg | ORAL_CAPSULE | Freq: Three times a day (TID) | ORAL | 0 refills | Status: DC | PRN

## 2023-05-06 NOTE — Patient Instructions (Addendum)
I want to review chart to determine next steps for blood pressure   I may refer you to pharmacy   Diabetes is better  Keep up the good work   Annual exam is due in April

## 2023-05-06 NOTE — Assessment & Plan Note (Signed)
bp not quite at goal  BP Readings from Last 1 Encounters:  05/06/23 (!) 140/77  Running similarly at home  Taking losartan 100 mg daily  Metoprolol xl 50 mg daily   Most recent labs reviewed  Disc lifstyle change with low sodium diet and exercise   Would like to avoid diuretics in light of mild ckd  Weight loss would help  Consider trial of 2.5 mg of amlodipine (in past 5 mg caused hypotension)

## 2023-05-06 NOTE — Assessment & Plan Note (Signed)
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

## 2023-05-06 NOTE — Assessment & Plan Note (Signed)
Disc goals for lipids and reasons to control them Rev last labs with pt Rev low sat fat diet in detail Zetia 10 mg daily  Tricor 145 mg daily  Bempedoic acid 180 mg daily   H/p statin myopathy LDL well controlled Trig 308 in setting of dm2

## 2023-05-06 NOTE — Progress Notes (Signed)
Subjective:    Patient ID: Martha Hamilton, female    DOB: 1950/08/02, 72 y.o.   MRN: [id number]  HPI  Wt Readings from Last 3 Encounters:  05/06/23 169 lb (76.7 kg)  03/19/23 173 lb 12.8 oz (78.8 kg)  03/11/23 175 lb 14.4 oz (79.8 kg)   34.13 kg/m  Vitals:   05/06/23 0928 05/06/23 0950  BP: (!) 144/82 (!) 140/77  Pulse:    Temp:    SpO2:     Pt presents for follow up of DM2 and chronic health problems    HTN bp is stable today  No cp or palpitations or headaches or edema  No side effects to medicines  BP Readings from Last 3 Encounters:  05/06/23 (!) 140/77  04/09/23 (!) 153/76  04/02/23 (!) 145/77     Losartan 100 mg daily  Metoprolol xl 50 mg daily   Sees cardiology At home over 140 on top  Low blood pressure on amlodipine 5 in the past     Dm2 Lab Results  Component Value Date   HGBA1C 6.0 (A) 05/06/2023   This is down from 7.6 in July  Glipizide xl 5 mg daily  Metformin 500 mg bid   Much better habits  Exercising- added some hand weights for upper body  A sit down pedal machine   Arthritis has been so bad in knees  Sees ortho in jan   Is also eating better  Eating less sweets / a lot - she stopped baking for herself      Lab Results  Component Value Date   MICROALBUR 4.9 (H) 01/19/2023   MICROALBUR 1.1 04/29/2010     No statin due to myopathy   Lab Results  Component Value Date   NA 137 03/06/2023   K 4.0 03/06/2023   CO2 21 (L) 03/06/2023   GLUCOSE 102 (H) 03/06/2023   BUN 27 (H) 03/06/2023   CREATININE 1.06 (H) 03/06/2023   CALCIUM 9.3 03/06/2023   GFR 52.09 (L) 01/19/2023   GFRNONAA 56 (L) 03/06/2023     Lab Results  Component Value Date   CHOL 148 03/06/2023   HDL 39 (L) 03/06/2023   LDLCALC 47 03/06/2023   LDLDIRECT 80.0 01/19/2023   TRIG 308 (H) 03/06/2023   CHOLHDL 3.8 03/06/2023   Takes gabapentin  Comes from ortho  She cannot get them to refill      Patient Active Problem List   Diagnosis Date  Noted   Pedal edema 11/12/2022   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   Degenerative disc disease, cervical 03/03/2022   Muscle cramps 02/28/2022   Neck pain 02/28/2022   Syncope 12/18/2021   Strain of flexor tendon of wrist 12/02/2021   Tendinitis of left wrist 12/02/2021   Left wrist pain 11/13/2021   Family history of cancer 08/27/2020   History of breast cancer 08/27/2020   IDA (iron deficiency anemia) 08/27/2020   B12 deficiency 08/27/2020   Chronic kidney disease (CKD) stage G3b/A1, moderately decreased glomerular filtration rate (GFR) between 30-44 mL/min/1.73 square meter and albuminuria creatinine ratio less than 30 mg/g (HCC) 07/24/2020   Tachycardia 01/11/2019   Fatty liver 06/21/2018   Obesity (BMI 30-39.9) 06/21/2018   Dyspepsia 05/25/2018   GERD (gastroesophageal reflux disease) 05/25/2018   H/O vertigo 05/11/2018   Essential (hemorrhagic) thrombocythemia (HCC) 12/25/2017   Tear of medial meniscus of knee 12/21/2017   Pain in left knee 12/21/2017   Left knee pain 09/21/2017   Welcome to Medicare preventive visit 12/17/2016  Grief reaction 12/17/2016   Estrogen deficiency 09/08/2016   Myalgia 02/08/2016   Normocytic anemia 02/09/2013   Abnormal EKG 07/27/2012   Special screening for malignant neoplasms, colon 04/29/2011   Encounter for gynecological examination 04/29/2011   Routine general medical examination at a health care facility 04/20/2011   Osteoarthrosis, hand 10/31/2008   Diabetes type 2, controlled (HCC) 07/10/2008   Hyperlipidemia associated with type 2 diabetes mellitus (HCC) 01/26/2008   Essential hypertension 01/26/2008   Allergic rhinitis 12/07/2007   Past Medical History:  Diagnosis Date   Allergic rhinitis    Allergy    Asthma    Breast cancer (HCC) 1996   right breast   Cataract Oct. 2021   Depression 1981   DM2 (diabetes mellitus, type 2) (HCC)    GERD (gastroesophageal reflux disease)    Hx of cardiovascular stress test    a. Lex MV 2/14:   EF 69%, no ischemia   Hyperlipidemia    Hypertension    IDA (iron deficiency anemia) 08/27/2020   Iron deficiency anemia    Obesity    Osteoarthritis    hands   Ulcer    Past Surgical History:  Procedure Laterality Date   APPENDECTOMY  1976   BREAST LUMPECTOMY Right 1996   right breast, lumpectomy w/nodes   CATARACT EXTRACTION W/PHACO Left 11/28/2020   Procedure: CATARACT EXTRACTION PHACO AND INTRAOCULAR LENS PLACEMENT (IOC) LEFT DIABETIC;  Surgeon: Lockie Mola, MD;  Location: [facility];  Service: Ophthalmology;  Laterality: Left;  5.02 01:07.4 7.4%   CATARACT EXTRACTION W/PHACO Right 12/12/2020   Procedure: CATARACT EXTRACTION PHACO AND INTRAOCULAR LENS PLACEMENT (IOC) RIGHT DIABETIC 5.22 00:59.2;  Surgeon: Lockie Mola, MD;  Location: [facility];  Service: Ophthalmology;  Laterality: Right;   CHONDROPLASTY Left 02/10/2018   Procedure: CHONDROPLASTY;  Surgeon: Lyndle Herrlich, MD;  Location: [facility];  Service: Orthopedics;  Laterality: Left;   EYE SURGERY     FOOT SURGERY Left 07/2022   bone removal   GASTRIC RESTRICTION SURGERY     for reflux   HYSTEROSCOPY WITH D & C N/A 10/18/2012   Procedure: DILATATION AND CURETTAGE /HYSTEROSCOPY;  Surgeon: Catalina Antigua, MD;  Location: [facility];  Service: Gynecology;  Laterality: N/A;   KNEE ARTHROSCOPY WITH MEDIAL MENISECTOMY Left 02/10/2018   Procedure: KNEE ARTHROSCOPY WITH MEDIAL and LATERAL MENISECTOMY;  Surgeon: Lyndle Herrlich, MD;  Location: [facility];  Service: Orthopedics;  Laterality: Left;   POLYPECTOMY N/A 10/18/2012   Procedure: POLYPECTOMY;  Surgeon: Catalina Antigua, MD;  Location: [facility];  Service: Gynecology;  Laterality: N/A;   SYNOVECTOMY Left 02/10/2018   Procedure: SYNOVECTOMY;  Surgeon: Lyndle Herrlich, MD;  Location: [facility];  Service: Orthopedics;  Laterality: Left;   Social History   Tobacco Use   Smoking status: Never   Smokeless tobacco: Never   Tobacco comments:    Never  smoked  Vaping Use   Vaping status: Never Used  Substance Use Topics   Alcohol use: Never   Drug use: Never   Family History  Problem Relation Age of Onset   Diabetes Mother    Heart disease Mother        Pacemaker, CHF   Hypertension Mother    Kidney cancer Mother    Cancer Mother    Stroke Father    CAD Father 15       Died with MI   Heart disease Father    Esophageal cancer Brother    Diabetes Brother  Heart disease Sister    Cancer Brother    Diabetes Sister    Colon cancer Neg Hx    Rectal cancer Neg Hx    Stomach cancer Neg Hx    Pancreatic cancer Neg Hx    Allergies  Allergen Reactions   Cholestyramine Other (See Comments)    REACTION: Muscles tightened up, drew up.   Simvastatin Other (See Comments)    Muscular pain   Lipitor [Atorvastatin] Other (See Comments)    Muscle cramping   Niacin Nausea And Vomiting   Current Outpatient Medications on File Prior to Visit  Medication Sig Dispense Refill   ACCU-CHEK AVIVA PLUS test strip TEST BLOOD SUGAR EVERY DAY FOR DIABETES 100 strip 3   Accu-Chek Softclix Lancets lancets TEST BLOOD SUGAR EVERY DAY FOR DIABETES 100 each 1   acetaminophen (TYLENOL) 500 MG tablet Take 1,000 mg by mouth every 6 (six) hours as needed for mild pain.      Alcohol Swabs (DROPSAFE ALCOHOL PREP) 70 % PADS USE ONE TIME DAILY AS DIRECTED WHEN CHECKING BLOOD SUGAR 100 each 1   amitriptyline (ELAVIL) 25 MG tablet TAKE 1 TABLET AT BEDTIME 90 tablet 0   aspirin 81 MG tablet Take 81 mg by mouth at bedtime.      Bempedoic Acid (NEXLETOL) 180 MG TABS Take 1 tablet (180 mg total) by mouth daily. 30 tablet 2   Blood Glucose Calibration (ACCU-CHEK AVIVA) SOLN USE TO CALIBRATE METER AS DIRECTED 1 each 2   Blood Glucose Monitoring Suppl (ACCU-CHEK AVIVA PLUS) w/Device KIT Use to check blood sugar once daily for DM (dx. E11.9) 1 kit 0   calcium elemental as carbonate (BARIATRIC TUMS ULTRA) 400 MG chewable tablet Chew 1,000 mg by mouth daily as needed for  heartburn.     Camphor-Menthol-Methyl Sal (SALONPAS) 3.07-19-08 % PTCH Apply topically.     Cholecalciferol (VITAMIN D) 2000 units tablet Take 2,000 Units by mouth daily.     Docusate Sodium (DSS) 100 MG CAPS Take 1 capsule by mouth 2 (two) times daily as needed.     ezetimibe (ZETIA) 10 MG tablet Take 1 tablet (10 mg total) by mouth daily. 90 tablet 0   fenofibrate (TRICOR) 145 MG tablet TAKE 1 TABLET EVERY DAY 90 tablet 1   glipiZIDE (GLUCOTROL XL) 5 MG 24 hr tablet TAKE 1 TABLET EVERY DAY WITH BREAKFAST 90 tablet 1   loratadine (CLARITIN) 10 MG tablet Take 10 mg by mouth daily as needed for allergies.     losartan (COZAAR) 100 MG tablet Take 1 tablet (100 mg total) by mouth daily. 90 tablet 3   meclizine (ANTIVERT) 25 MG tablet Take 1 tablet (25 mg total) by mouth 3 (three) times daily as needed for dizziness. 90 tablet 3   metFORMIN (GLUCOPHAGE) 1000 MG tablet TAKE 1/2 TABLET TWICE DAILY WITH MEALS 90 tablet 1   metoprolol succinate (TOPROL-XL) 100 MG 24 hr tablet Take 1 tablet (100 mg total) by mouth daily. 90 tablet 0   omeprazole (PRILOSEC) 40 MG capsule TAKE 1 CAPSULE IN THE MORNING AND AT BEDTIME. 180 capsule 3   ondansetron (ZOFRAN) 4 MG tablet TAKE 1 TABLET EVERY 4 HOURS AS NEEDED FOR NAUSEA AND VOMITING 90 tablet 0   SALINE NASAL SPRAY NA Place 1 spray into the nose daily as needed (congestion).      No current facility-administered medications on file prior to visit.    Review of Systems  Constitutional:  Negative for activity change, appetite change, fatigue, fever  and unexpected weight change.  HENT:  Negative for congestion, ear pain, rhinorrhea, sinus pressure and sore throat.   Eyes:  Negative for pain, redness and visual disturbance.  Respiratory:  Negative for cough, shortness of breath and wheezing.   Cardiovascular:  Negative for chest pain and palpitations.  Gastrointestinal:  Negative for abdominal pain, blood in stool, constipation and diarrhea.  Endocrine: Negative  for polydipsia and polyuria.  Genitourinary:  Negative for dysuria, frequency and urgency.  Musculoskeletal:  Positive for arthralgias. Negative for back pain and myalgias.  Skin:  Negative for pallor and rash.  Allergic/Immunologic: Negative for environmental allergies.  Neurological:  Negative for dizziness, syncope and headaches.  Hematological:  Negative for adenopathy. Does not bruise/bleed easily.  Psychiatric/Behavioral:  Negative for decreased concentration and dysphoric mood. The patient is not nervous/anxious.        Objective:   Physical Exam Constitutional:      General: She is not in acute distress.    Appearance: Normal appearance. She is well-developed. She is obese. She is not ill-appearing or diaphoretic.  HENT:     Head: Normocephalic and atraumatic.  Eyes:     Conjunctiva/sclera: Conjunctivae normal.     Pupils: Pupils are equal, round, and reactive to light.  Neck:     Thyroid: No thyromegaly.     Vascular: No carotid bruit or JVD.  Cardiovascular:     Rate and Rhythm: Normal rate and regular rhythm.     Heart sounds: Normal heart sounds.     No gallop.  Pulmonary:     Effort: Pulmonary effort is normal. No respiratory distress.     Breath sounds: Normal breath sounds. No wheezing or rales.  Abdominal:     General: There is no distension or abdominal bruit.     Palpations: Abdomen is soft.  Musculoskeletal:     Cervical back: Normal range of motion and neck supple.     Right lower leg: No edema.     Left lower leg: No edema.  Lymphadenopathy:     Cervical: No cervical adenopathy.  Skin:    General: Skin is warm and dry.     Coloration: Skin is not pale.     Findings: No rash.  Neurological:     Mental Status: She is alert.     Coordination: Coordination normal.     Deep Tendon Reflexes: Reflexes are normal and symmetric. Reflexes normal.  Psychiatric:        Mood and Affect: Mood normal.           Assessment & Plan:   Problem List Items  Addressed This Visit       Cardiovascular and Mediastinum   Essential hypertension    bp not quite at goal  BP Readings from Last 1 Encounters:  05/06/23 (!) 140/77  Running similarly at home  Taking losartan 100 mg daily  Metoprolol xl 50 mg daily   Most recent labs reviewed  Disc lifstyle change with low sodium diet and exercise   Would like to avoid diuretics in light of mild ckd  Weight loss would help  Consider trial of 2.5 mg of amlodipine (in past 5 mg caused hypotension)          Endocrine   Diabetes type 2, controlled (HCC) - Primary    Lab Results  Component Value Date   HGBA1C 6.0 (A) 05/06/2023   Improved  Down from 7.6  Commended Better habits Continues glipizide xl 5 mg daily  Metformin 500  mg bid  Microalb utd  No statin due to myopathy  On arb Eye exam utd  Follow up planned  Watching renal function        Relevant Orders   POCT HgB A1C (Completed)   Hyperlipidemia associated with type 2 diabetes mellitus (HCC)    Disc goals for lipids and reasons to control them Rev last labs with pt Rev low sat fat diet in detail Zetia 10 mg daily  Tricor 145 mg daily  Bempedoic acid 180 mg daily   H/p statin myopathy LDL well controlled Trig 308 in setting of dm2

## 2023-05-11 ENCOUNTER — Telehealth: Payer: Self-pay | Admitting: *Deleted

## 2023-05-11 MED ORDER — AMLODIPINE BESYLATE 2.5 MG PO TABS: 2.5000 mg | ORAL_TABLET | Freq: Every day | ORAL | 1 refills | Status: DC

## 2023-05-11 NOTE — Telephone Encounter (Signed)
-----   Message from [place] sent at 05/06/2023  5:36 PM EDT ----- Please let pt know I thought about plan for blood pressure I want to start on very low dose amlodipine at 2.5 mg (in past 5 mg gave her hypotension but she may tolerate this ok) Let me know if agreeable and what pharmacy , thanks

## 2023-05-11 NOTE — Telephone Encounter (Signed)
I sent it  Update if side effects or problems Follow up here in 4-5 wk please

## 2023-05-11 NOTE — Telephone Encounter (Signed)
Pt notified of Dr. Tower's comments and f/u appt scheduled  

## 2023-05-11 NOTE — Telephone Encounter (Signed)
Pt notified of Dr. Royden Purl comments. Pt agrees with low dose of amlodipine.   [facility] [address]

## 2023-05-16 ENCOUNTER — Other Ambulatory Visit: Payer: Self-pay | Admitting: Family Medicine

## 2023-05-22 ENCOUNTER — Other Ambulatory Visit: Payer: Self-pay | Admitting: Family Medicine

## 2023-05-22 DIAGNOSIS — Z1231 Encounter for screening mammogram for malignant neoplasm of breast: Secondary | ICD-10-CM

## 2023-05-25 DIAGNOSIS — M542 Cervicalgia: Secondary | ICD-10-CM | POA: Diagnosis not present

## 2023-05-25 DIAGNOSIS — M5412 Radiculopathy, cervical region: Secondary | ICD-10-CM | POA: Diagnosis not present

## 2023-05-27 DIAGNOSIS — M542 Cervicalgia: Secondary | ICD-10-CM | POA: Diagnosis not present

## 2023-05-27 DIAGNOSIS — M5412 Radiculopathy, cervical region: Secondary | ICD-10-CM | POA: Diagnosis not present

## 2023-05-28 ENCOUNTER — Inpatient Hospital Stay: Payer: Medicare PPO | Attending: Internal Medicine

## 2023-05-28 DIAGNOSIS — E538 Deficiency of other specified B group vitamins: Secondary | ICD-10-CM | POA: Diagnosis not present

## 2023-05-28 DIAGNOSIS — D508 Other iron deficiency anemias: Secondary | ICD-10-CM

## 2023-05-28 MED ORDER — CYANOCOBALAMIN 1000 MCG/ML IJ SOLN
1000.0000 ug | Freq: Once | INTRAMUSCULAR | Status: AC
  Administered 2023-05-28: 1000 ug via INTRAMUSCULAR
  Filled 2023-05-28: qty 1

## 2023-06-02 ENCOUNTER — Ambulatory Visit (INDEPENDENT_AMBULATORY_CARE_PROVIDER_SITE_OTHER): Payer: Medicare PPO

## 2023-06-02 ENCOUNTER — Ambulatory Visit: Payer: Medicare PPO | Admitting: Podiatry

## 2023-06-02 DIAGNOSIS — M2041 Other hammer toe(s) (acquired), right foot: Secondary | ICD-10-CM | POA: Diagnosis not present

## 2023-06-02 DIAGNOSIS — M7751 Other enthesopathy of right foot: Secondary | ICD-10-CM

## 2023-06-02 MED ORDER — BETAMETHASONE SOD PHOS & ACET 6 (3-3) MG/ML IJ SUSP
3.0000 mg | Freq: Once | INTRAMUSCULAR | Status: AC
  Administered 2023-06-02: 3 mg via INTRA_ARTICULAR

## 2023-06-02 NOTE — Progress Notes (Signed)
No chief complaint on file.   HPI: 72 y.o. female presenting today for new complaint of pain and tenderness associated to the right great toe joint.  Idiopathic onset.  Patient states that on 05/30/2023 she stepped wrong and excessively flexed her toe while walking in the yard.  She noticed some pain and tenderness to the great toe and the following morning she had significant pain and tenderness.  She has not done anything currently for treatment.  Past Medical History:  Diagnosis Date   Allergic rhinitis    Allergy    Asthma    Breast cancer [facility]) 1996   right breast   Cataract Oct. 2021   Depression 1981   DM2 (diabetes mellitus, type 2) (HCC)    GERD (gastroesophageal reflux disease)    Hx of cardiovascular stress test    a. Lex MV 2/14:  EF 69%, no ischemia   Hyperlipidemia    Hypertension    IDA (iron deficiency anemia) 08/27/2020   Iron deficiency anemia    Obesity    Osteoarthritis    hands   Ulcer     Past Surgical History:  Procedure Laterality Date   APPENDECTOMY  1976   BREAST LUMPECTOMY Right 1996   right breast, lumpectomy w/nodes   CATARACT EXTRACTION W/PHACO Left 11/28/2020   Procedure: CATARACT EXTRACTION PHACO AND INTRAOCULAR LENS PLACEMENT (IOC) LEFT DIABETIC;  Surgeon: Lockie Mola, MD;  Location: [facility];  Service: Ophthalmology;  Laterality: Left;  5.02 01:07.4 7.4%   CATARACT EXTRACTION W/PHACO Right 12/12/2020   Procedure: CATARACT EXTRACTION PHACO AND INTRAOCULAR LENS PLACEMENT (IOC) RIGHT DIABETIC 5.22 00:59.2;  Surgeon: Lockie Mola, MD;  Location: [facility];  Service: Ophthalmology;  Laterality: Right;   CHONDROPLASTY Left 02/10/2018   Procedure: CHONDROPLASTY;  Surgeon: Lyndle Herrlich, MD;  Location: [facility];  Service: Orthopedics;  Laterality: Left;   EYE SURGERY     FOOT SURGERY Left 07/2022   bone removal   GASTRIC RESTRICTION SURGERY     for reflux   HYSTEROSCOPY WITH D & C N/A 10/18/2012    Procedure: DILATATION AND CURETTAGE /HYSTEROSCOPY;  Surgeon: Catalina Antigua, MD;  Location: [facility];  Service: Gynecology;  Laterality: N/A;   KNEE ARTHROSCOPY WITH MEDIAL MENISECTOMY Left 02/10/2018   Procedure: KNEE ARTHROSCOPY WITH MEDIAL and LATERAL MENISECTOMY;  Surgeon: Lyndle Herrlich, MD;  Location: [facility];  Service: Orthopedics;  Laterality: Left;   POLYPECTOMY N/A 10/18/2012   Procedure: POLYPECTOMY;  Surgeon: Catalina Antigua, MD;  Location: [facility];  Service: Gynecology;  Laterality: N/A;   SYNOVECTOMY Left 02/10/2018   Procedure: SYNOVECTOMY;  Surgeon: Lyndle Herrlich, MD;  Location: [facility];  Service: Orthopedics;  Laterality: Left;    Allergies  Allergen Reactions   Cholestyramine Other (See Comments)    REACTION: Muscles tightened up, drew up.   Simvastatin Other (See Comments)    Muscular pain   Lipitor [Atorvastatin] Other (See Comments)    Muscle cramping   Niacin Nausea And Vomiting     Physical Exam: General: The patient is alert and oriented x3 in no acute distress.  Dermatology: Skin is warm, dry and supple bilateral lower extremities.   Vascular: Palpable pedal pulses bilaterally. Capillary refill within normal limits.  No appreciable edema.  No erythema.  Neurological: Grossly intact via light touch  Musculoskeletal Exam: No pedal deformities noted.  There is tenderness with palpation along the extensor hallucis longus just proximal to the first MTP.  There is also some tenderness with  palpation and range of motion of the first MTP.  Muscle strength 5/5 all compartments.  Tendons are intact with adequate resistance against pressure  Radiographic Exam RT foot 06/02/2023:  Normal osseous mineralization.  No fractures noted.  Generalized degenerative changes noted throughout the pedal joints of the foot.  Impression: Negative  Assessment/Plan of Care: 1.  EHL tendinitis right great toe  -Patient evaluated.  X-rays reviewed -Injection of 0.5 cc Celestone  Soluspan injected along the extensor hallucis longus tendon sheath and into the first MTP joint - Patient already takes gabapentin and Motrin.  Continue as prescribed - Postsurgical shoe dispensed.  WBAT with the assistance of her cane -Return to clinic 3 weeks       Felecia Shelling, DPM [facility]  Dr. Felecia Shelling, DPM    [address] [place], [place] [address]                Office [phone]  Fax [phone]

## 2023-06-04 DIAGNOSIS — M5412 Radiculopathy, cervical region: Secondary | ICD-10-CM | POA: Diagnosis not present

## 2023-06-09 ENCOUNTER — Encounter: Payer: Self-pay | Admitting: Family Medicine

## 2023-06-09 ENCOUNTER — Ambulatory Visit: Payer: Medicare PPO | Admitting: Family Medicine

## 2023-06-09 VITALS — BP 139/80 | HR 90 | Temp 98.2°F | Ht 59.0 in | Wt 171.4 lb

## 2023-06-09 DIAGNOSIS — Z7984 Long term (current) use of oral hypoglycemic drugs: Secondary | ICD-10-CM

## 2023-06-09 DIAGNOSIS — E119 Type 2 diabetes mellitus without complications: Secondary | ICD-10-CM | POA: Diagnosis not present

## 2023-06-09 DIAGNOSIS — Z1159 Encounter for screening for other viral diseases: Secondary | ICD-10-CM | POA: Insufficient documentation

## 2023-06-09 DIAGNOSIS — I10 Essential (primary) hypertension: Secondary | ICD-10-CM

## 2023-06-09 NOTE — Patient Instructions (Addendum)
See cardiology as planned on 06/18/23  They may want to increase medication    Stay on current medicines Blood pressure was better on 2nd check  Ibuprofen and the shot of cortisone may affect it   Stop ibuprofen when you are able   Keep drinking fluids   Take care of yourself  Avoid processed foods Try to get most of your carbohydrates from produce (with the exception of white potatoes) and whole grains Eat less bread/pasta/rice/snack foods/cereals/sweets and other items from the middle of the grocery store (processed carbs)   Labs today for hep C screen

## 2023-06-09 NOTE — Progress Notes (Signed)
Subjective:    Patient ID: Martha Hamilton, female    DOB: October 13, 1950, 72 y.o.   MRN: [id number]  HPI  Wt Readings from Last 3 Encounters:  06/09/23 171 lb 6 oz (77.7 kg)  05/06/23 169 lb (76.7 kg)  03/19/23 173 lb 12.8 oz (78.8 kg)   34.61 kg/m  Vitals:   06/09/23 0929 06/09/23 0946  BP: (!) 184/96 139/80  Pulse: (!) 106 90  Temp: 98.2 F (36.8 C)   SpO2: 95%     Pt presents for follow up of HTN and chronic medical problems  Stepped in a hole and injured her tendons on right foot  Saw Dr Logan Bores -had a shot in it on nov 19th   Taking ibuprofen for pain -over the counter 400 mg   HTN Blood pressure has been up walking with a cane and boot on her foot   No cp or palpitations or headaches or edema  No side effects to medicines  BP Readings from Last 3 Encounters:  06/09/23 139/80  05/06/23 (!) 140/77  04/09/23 (!) 153/76    Losartan 100 mg daily - she takes it 50 mg bid  Metoprolol xl 100 mg daily in am   We added 2.5 mg of amlodipine at last visit (5 mg caused hypotension) Takes that at night   Has been watching her blood pressure at home   Normally 150s/80s before breakfast and blood pressure medicine  Best reading is after 2 pm - 130s/70s  with pulse in 80s   Pulse Readings from Last 3 Encounters:  06/09/23 90  05/06/23 78  04/09/23 89     Sees cardiology on 12/5  Also has a pain management appointment upcoming (for neck pain)   Is drinking enough fluids    Lab Results  Component Value Date   NA 137 03/06/2023   K 4.0 03/06/2023   CO2 21 (L) 03/06/2023   GLUCOSE 102 (H) 03/06/2023   BUN 27 (H) 03/06/2023   CREATININE 1.06 (H) 03/06/2023   CALCIUM 9.3 03/06/2023   GFR 52.09 (L) 01/19/2023   GFRNONAA 56 (L) 03/06/2023    Annual exam is due in April   Lab Results  Component Value Date   HGBA1C 6.0 (A) 05/06/2023   Dm in good control   Due for foot exam but in a boot due to injury    Is back on gabapentin for her neck    Patient  Active Problem List   Diagnosis Date Noted   Encounter for hepatitis C screening test for low risk patient 06/09/2023   Pedal edema 11/12/2022   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   Degenerative disc disease, cervical 03/03/2022   Muscle cramps 02/28/2022   Neck pain 02/28/2022   Syncope 12/18/2021   Strain of flexor tendon of wrist 12/02/2021   Tendinitis of left wrist 12/02/2021   Left wrist pain 11/13/2021   Family history of cancer 08/27/2020   History of breast cancer 08/27/2020   IDA (iron deficiency anemia) 08/27/2020   B12 deficiency 08/27/2020   Chronic kidney disease (CKD) stage G3b/A1, moderately decreased glomerular filtration rate (GFR) between 30-44 mL/min/1.73 square meter and albuminuria creatinine ratio less than 30 mg/g (HCC) 07/24/2020   Tachycardia 01/11/2019   Fatty liver 06/21/2018   Obesity (BMI 30-39.9) 06/21/2018   Dyspepsia 05/25/2018   GERD (gastroesophageal reflux disease) 05/25/2018   H/O vertigo 05/11/2018   Essential (hemorrhagic) thrombocythemia (HCC) 12/25/2017   Tear of medial meniscus of knee 12/21/2017   Pain in  left knee 12/21/2017   Left knee pain 09/21/2017   Welcome to Medicare preventive visit 12/17/2016   Grief reaction 12/17/2016   Estrogen deficiency 09/08/2016   Myalgia 02/08/2016   Normocytic anemia 02/09/2013   Abnormal EKG 07/27/2012   Special screening for malignant neoplasms, colon 04/29/2011   Encounter for gynecological examination 04/29/2011   Routine general medical examination at a health care facility 04/20/2011   Osteoarthrosis, hand 10/31/2008   Diabetes type 2, controlled (HCC) 07/10/2008   Hyperlipidemia associated with type 2 diabetes mellitus (HCC) 01/26/2008   Essential hypertension 01/26/2008   Allergic rhinitis 12/07/2007   Past Medical History:  Diagnosis Date   Allergic rhinitis    Allergy    Asthma    Breast cancer (HCC) 1996   right breast   Cataract Oct. 2021   Depression 1981   DM2 (diabetes mellitus,  type 2) (HCC)    GERD (gastroesophageal reflux disease)    Hx of cardiovascular stress test    a. Lex MV 2/14:  EF 69%, no ischemia   Hyperlipidemia    Hypertension    IDA (iron deficiency anemia) 08/27/2020   Iron deficiency anemia    Obesity    Osteoarthritis    hands   Ulcer    Past Surgical History:  Procedure Laterality Date   APPENDECTOMY  1976   BREAST LUMPECTOMY Right 1996   right breast, lumpectomy w/nodes   CATARACT EXTRACTION W/PHACO Left 11/28/2020   Procedure: CATARACT EXTRACTION PHACO AND INTRAOCULAR LENS PLACEMENT (IOC) LEFT DIABETIC;  Surgeon: Lockie Mola, MD;  Location: [facility];  Service: Ophthalmology;  Laterality: Left;  5.02 01:07.4 7.4%   CATARACT EXTRACTION W/PHACO Right 12/12/2020   Procedure: CATARACT EXTRACTION PHACO AND INTRAOCULAR LENS PLACEMENT (IOC) RIGHT DIABETIC 5.22 00:59.2;  Surgeon: Lockie Mola, MD;  Location: [facility];  Service: Ophthalmology;  Laterality: Right;   CHONDROPLASTY Left 02/10/2018   Procedure: CHONDROPLASTY;  Surgeon: Lyndle Herrlich, MD;  Location: [facility];  Service: Orthopedics;  Laterality: Left;   EYE SURGERY     FOOT SURGERY Left 07/2022   bone removal   GASTRIC RESTRICTION SURGERY     for reflux   HYSTEROSCOPY WITH D & C N/A 10/18/2012   Procedure: DILATATION AND CURETTAGE /HYSTEROSCOPY;  Surgeon: Catalina Antigua, MD;  Location: [facility];  Service: Gynecology;  Laterality: N/A;   KNEE ARTHROSCOPY WITH MEDIAL MENISECTOMY Left 02/10/2018   Procedure: KNEE ARTHROSCOPY WITH MEDIAL and LATERAL MENISECTOMY;  Surgeon: Lyndle Herrlich, MD;  Location: [facility];  Service: Orthopedics;  Laterality: Left;   POLYPECTOMY N/A 10/18/2012   Procedure: POLYPECTOMY;  Surgeon: Catalina Antigua, MD;  Location: [facility];  Service: Gynecology;  Laterality: N/A;   SYNOVECTOMY Left 02/10/2018   Procedure: SYNOVECTOMY;  Surgeon: Lyndle Herrlich, MD;  Location: [facility];  Service: Orthopedics;  Laterality: Left;    Social History   Tobacco Use   Smoking status: Never   Smokeless tobacco: Never   Tobacco comments:    Never smoked  Vaping Use   Vaping status: Never Used  Substance Use Topics   Alcohol use: Never   Drug use: Never   Family History  Problem Relation Age of Onset   Diabetes Mother    Heart disease Mother        Pacemaker, CHF   Hypertension Mother    Kidney cancer Mother    Cancer Mother    Stroke Father    CAD Father 68       Died with  MI   Heart disease Father    Esophageal cancer Brother    Diabetes Brother    Heart disease Sister    Cancer Brother    Diabetes Sister    Colon cancer Neg Hx    Rectal cancer Neg Hx    Stomach cancer Neg Hx    Pancreatic cancer Neg Hx    Allergies  Allergen Reactions   Cholestyramine Other (See Comments)    REACTION: Muscles tightened up, drew up.   Simvastatin Other (See Comments)    Muscular pain   Lipitor [Atorvastatin] Other (See Comments)    Muscle cramping   Niacin Nausea And Vomiting   Current Outpatient Medications on File Prior to Visit  Medication Sig Dispense Refill   ACCU-CHEK AVIVA PLUS test strip TEST BLOOD SUGAR EVERY DAY FOR DIABETES 100 strip 3   Accu-Chek Softclix Lancets lancets TEST BLOOD SUGAR EVERY DAY FOR DIABETES 100 each 1   acetaminophen (TYLENOL) 500 MG tablet Take 1,000 mg by mouth every 6 (six) hours as needed for mild pain.      Alcohol Swabs (DROPSAFE ALCOHOL PREP) 70 % PADS USE ONE TIME DAILY AS DIRECTED WHEN CHECKING BLOOD SUGAR 100 each 1   amitriptyline (ELAVIL) 25 MG tablet TAKE 1 TABLET AT BEDTIME 90 tablet 1   amLODipine (NORVASC) 2.5 MG tablet Take 1 tablet (2.5 mg total) by mouth daily. 30 tablet 1   aspirin 81 MG tablet Take 81 mg by mouth at bedtime.      Bempedoic Acid (NEXLETOL) 180 MG TABS Take 1 tablet (180 mg total) by mouth daily. 30 tablet 2   Blood Glucose Calibration (ACCU-CHEK AVIVA) SOLN USE TO CALIBRATE METER AS DIRECTED 1 each 2   Blood Glucose Monitoring Suppl  (ACCU-CHEK AVIVA PLUS) w/Device KIT Use to check blood sugar once daily for DM (dx. E11.9) 1 kit 0   calcium elemental as carbonate (BARIATRIC TUMS ULTRA) 400 MG chewable tablet Chew 1,000 mg by mouth daily as needed for heartburn.     Camphor-Menthol-Methyl Sal (SALONPAS) 3.07-19-08 % PTCH Apply topically.     Cholecalciferol (VITAMIN D) 2000 units tablet Take 2,000 Units by mouth daily.     Docusate Sodium (DSS) 100 MG CAPS Take 1 capsule by mouth 2 (two) times daily as needed.     ezetimibe (ZETIA) 10 MG tablet Take 1 tablet (10 mg total) by mouth daily. 90 tablet 0   fenofibrate (TRICOR) 145 MG tablet TAKE 1 TABLET EVERY DAY 90 tablet 1   gabapentin (NEURONTIN) 300 MG capsule Take 1 capsule (300 mg total) by mouth 3 (three) times daily as needed. 90 capsule 0   glipiZIDE (GLUCOTROL XL) 5 MG 24 hr tablet TAKE 1 TABLET EVERY DAY WITH BREAKFAST 90 tablet 1   loratadine (CLARITIN) 10 MG tablet Take 10 mg by mouth daily as needed for allergies.     losartan (COZAAR) 100 MG tablet Take 1 tablet (100 mg total) by mouth daily. 90 tablet 3   meclizine (ANTIVERT) 25 MG tablet Take 1 tablet (25 mg total) by mouth 3 (three) times daily as needed for dizziness. 90 tablet 3   metFORMIN (GLUCOPHAGE) 1000 MG tablet TAKE 1/2 TABLET TWICE DAILY WITH MEALS 90 tablet 1   metoprolol succinate (TOPROL-XL) 100 MG 24 hr tablet Take 1 tablet (100 mg total) by mouth daily. 90 tablet 0   omeprazole (PRILOSEC) 40 MG capsule TAKE 1 CAPSULE IN THE MORNING AND AT BEDTIME. 180 capsule 3   ondansetron (ZOFRAN) 4 MG  tablet TAKE 1 TABLET EVERY 4 HOURS AS NEEDED FOR NAUSEA AND VOMITING 90 tablet 0   SALINE NASAL SPRAY NA Place 1 spray into the nose daily as needed (congestion).      No current facility-administered medications on file prior to visit.    Review of Systems  Constitutional:  Negative for activity change, appetite change, fatigue, fever and unexpected weight change.  HENT:  Negative for congestion, ear pain,  rhinorrhea, sinus pressure and sore throat.   Eyes:  Negative for pain, redness and visual disturbance.  Respiratory:  Negative for cough, shortness of breath and wheezing.   Cardiovascular:  Negative for chest pain and palpitations.  Gastrointestinal:  Negative for abdominal pain, blood in stool, constipation and diarrhea.  Endocrine: Negative for polydipsia and polyuria.  Genitourinary:  Negative for dysuria, frequency and urgency.  Musculoskeletal:  Negative for arthralgias, back pain and myalgias.       Foot pain after injury   Skin:  Negative for pallor and rash.  Allergic/Immunologic: Negative for environmental allergies.  Neurological:  Negative for dizziness, syncope and headaches.  Hematological:  Negative for adenopathy. Does not bruise/bleed easily.  Psychiatric/Behavioral:  Negative for decreased concentration and dysphoric mood. The patient is not nervous/anxious.        Objective:   Physical Exam Constitutional:      General: She is not in acute distress.    Appearance: Normal appearance. She is well-developed. She is obese. She is not ill-appearing.  HENT:     Head: Normocephalic and atraumatic.  Eyes:     Conjunctiva/sclera: Conjunctivae normal.     Pupils: Pupils are equal, round, and reactive to light.  Neck:     Thyroid: No thyromegaly.     Vascular: No carotid bruit or JVD.  Cardiovascular:     Rate and Rhythm: Normal rate and regular rhythm.     Heart sounds: Normal heart sounds.     No gallop.  Pulmonary:     Effort: Pulmonary effort is normal. No respiratory distress.     Breath sounds: Normal breath sounds. No wheezing or rales.  Abdominal:     General: There is no distension or abdominal bruit.     Palpations: Abdomen is soft.  Musculoskeletal:     Cervical back: Normal range of motion and neck supple.     Right lower leg: No edema.     Left lower leg: No edema.     Comments: Boot on right foot/leg   Lymphadenopathy:     Cervical: No cervical  adenopathy.  Skin:    General: Skin is warm and dry.     Coloration: Skin is not pale.     Findings: No rash.  Neurological:     Mental Status: She is alert.     Coordination: Coordination normal.     Deep Tendon Reflexes: Reflexes are normal and symmetric. Reflexes normal.  Psychiatric:        Mood and Affect: Mood normal.           Assessment & Plan:   Problem List Items Addressed This Visit       Cardiovascular and Mediastinum   Essential hypertension - Primary    bp is still not at goal/ but improved  BP Readings from Last 1 Encounters:  06/09/23 139/80   Is taking ibuprofen and had recent cortisone shot -could impact readings Plan to continue  Losartan 100 mg (takes 50 mg bid which she tolerates well) Metoprolol xl 100 mg daily in  am (? If change to pm would help in future) Amlodipine 2.5 mg daily  Most recent labs reviewed  Disc lifstyle change with low sodium diet and exercise   Will follow up with cardiology soon as planned Suggest increase in amlodipine if needed  Encouraged good water intake          Endocrine   Diabetes type 2, controlled (HCC)    Lab Results  Component Value Date   HGBA1C 6.0 (A) 05/06/2023   Improved  Down from 7.6  Commended Better habits Continues glipizide xl 5 mg daily  Metformin 500 mg bid  Microalb utd  No statin due to myopathy  On arb Eye exam utd  Follow up planned  Watching renal function   Have to put off foot exam due to boot on right foot today          Other   Encounter for hepatitis C screening test for low risk patient    Hep C ab test today      Relevant Orders   Hepatitis C Antibody

## 2023-06-09 NOTE — Assessment & Plan Note (Signed)
Hep C ab test today

## 2023-06-09 NOTE — Assessment & Plan Note (Signed)
Lab Results  Component Value Date   HGBA1C 6.0 (A) 05/06/2023   Improved  Down from 7.6  Commended Better habits Continues glipizide xl 5 mg daily  Metformin 500 mg bid  Microalb utd  No statin due to myopathy  On arb Eye exam utd  Follow up planned  Watching renal function   Have to put off foot exam due to boot on right foot today

## 2023-06-09 NOTE — Assessment & Plan Note (Signed)
bp is still not at goal/ but improved  BP Readings from Last 1 Encounters:  06/09/23 139/80   Is taking ibuprofen and had recent cortisone shot -could impact readings Plan to continue  Losartan 100 mg (takes 50 mg bid which she tolerates well) Metoprolol xl 100 mg daily in am (? If change to pm would help in future) Amlodipine 2.5 mg daily  Most recent labs reviewed  Disc lifstyle change with low sodium diet and exercise   Will follow up with cardiology soon as planned Suggest increase in amlodipine if needed  Encouraged good water intake

## 2023-06-10 LAB — HEPATITIS C ANTIBODY: Hepatitis C Ab: NONREACTIVE

## 2023-06-15 ENCOUNTER — Inpatient Hospital Stay: Payer: Medicare PPO | Attending: Internal Medicine

## 2023-06-15 DIAGNOSIS — E538 Deficiency of other specified B group vitamins: Secondary | ICD-10-CM | POA: Insufficient documentation

## 2023-06-15 DIAGNOSIS — Z800 Family history of malignant neoplasm of digestive organs: Secondary | ICD-10-CM | POA: Insufficient documentation

## 2023-06-15 DIAGNOSIS — Z853 Personal history of malignant neoplasm of breast: Secondary | ICD-10-CM | POA: Insufficient documentation

## 2023-06-15 DIAGNOSIS — D509 Iron deficiency anemia, unspecified: Secondary | ICD-10-CM | POA: Diagnosis not present

## 2023-06-15 DIAGNOSIS — Z8051 Family history of malignant neoplasm of kidney: Secondary | ICD-10-CM | POA: Insufficient documentation

## 2023-06-15 DIAGNOSIS — D508 Other iron deficiency anemias: Secondary | ICD-10-CM

## 2023-06-15 LAB — CBC WITH DIFFERENTIAL (CANCER CENTER ONLY)
Abs Immature Granulocytes: 0.15 10*3/uL — ABNORMAL HIGH (ref 0.00–0.07)
Basophils Absolute: 0.1 10*3/uL (ref 0.0–0.1)
Basophils Relative: 1 %
Eosinophils Absolute: 0.3 10*3/uL (ref 0.0–0.5)
Eosinophils Relative: 4 %
HCT: 39.2 % (ref 36.0–46.0)
Hemoglobin: 12.8 g/dL (ref 12.0–15.0)
Immature Granulocytes: 2 %
Lymphocytes Relative: 22 %
Lymphs Abs: 1.6 10*3/uL (ref 0.7–4.0)
MCH: 28.3 pg (ref 26.0–34.0)
MCHC: 32.7 g/dL (ref 30.0–36.0)
MCV: 86.5 fL (ref 80.0–100.0)
Monocytes Absolute: 0.4 10*3/uL (ref 0.1–1.0)
Monocytes Relative: 6 %
Neutro Abs: 4.7 10*3/uL (ref 1.7–7.7)
Neutrophils Relative %: 65 %
Platelet Count: 334 10*3/uL (ref 150–400)
RBC: 4.53 MIL/uL (ref 3.87–5.11)
RDW: 15.6 % — ABNORMAL HIGH (ref 11.5–15.5)
WBC Count: 7.2 10*3/uL (ref 4.0–10.5)
nRBC: 0 % (ref 0.0–0.2)

## 2023-06-15 LAB — FERRITIN: Ferritin: 45 ng/mL (ref 11–307)

## 2023-06-15 LAB — IRON AND TIBC
Iron: 85 ug/dL (ref 28–170)
Saturation Ratios: 19 % (ref 10.4–31.8)
TIBC: 452 ug/dL — ABNORMAL HIGH (ref 250–450)
UIBC: 367 ug/dL

## 2023-06-15 LAB — VITAMIN B12: Vitamin B-12: 283 pg/mL (ref 180–914)

## 2023-06-16 ENCOUNTER — Inpatient Hospital Stay: Payer: Medicare PPO | Admitting: Oncology

## 2023-06-16 ENCOUNTER — Inpatient Hospital Stay: Payer: Medicare PPO

## 2023-06-16 ENCOUNTER — Encounter: Payer: Self-pay | Admitting: Oncology

## 2023-06-16 VITALS — BP 131/75 | HR 101 | Temp 98.5°F | Resp 18 | Wt 174.2 lb

## 2023-06-16 DIAGNOSIS — Z8051 Family history of malignant neoplasm of kidney: Secondary | ICD-10-CM | POA: Diagnosis not present

## 2023-06-16 DIAGNOSIS — D508 Other iron deficiency anemias: Secondary | ICD-10-CM | POA: Diagnosis not present

## 2023-06-16 DIAGNOSIS — E538 Deficiency of other specified B group vitamins: Secondary | ICD-10-CM | POA: Diagnosis not present

## 2023-06-16 DIAGNOSIS — Z800 Family history of malignant neoplasm of digestive organs: Secondary | ICD-10-CM | POA: Diagnosis not present

## 2023-06-16 DIAGNOSIS — D509 Iron deficiency anemia, unspecified: Secondary | ICD-10-CM | POA: Diagnosis not present

## 2023-06-16 DIAGNOSIS — Z853 Personal history of malignant neoplasm of breast: Secondary | ICD-10-CM | POA: Diagnosis not present

## 2023-06-16 MED ORDER — B-12 2500 MCG SL SUBL: 1.0000 | SUBLINGUAL_TABLET | Freq: Every day | SUBLINGUAL | 5 refills | Status: AC

## 2023-06-16 NOTE — Assessment & Plan Note (Addendum)
Iron deficiency anemia Chronic blood loss vs malabsorption.  She has had GI work up in the past.  Labs reviewed and discussed with patient.   Lab Results  Component Value Date   HGB 12.8 06/15/2023   TIBC 452 (H) 06/15/2023   IRONPCTSAT 19 06/15/2023   FERRITIN 45 06/15/2023   Improved hemoglobin and iron panel. Hold off Venofer.  Recommend GI work up. She will call GI office.

## 2023-06-16 NOTE — Assessment & Plan Note (Signed)
Remote history of right breast cancer 1996 Continue annual screening mammogram.

## 2023-06-16 NOTE — Assessment & Plan Note (Addendum)
B12 level improved. Recommend sublingual B12 daily.  Will increase B12 injection intervals to every 2 months.

## 2023-06-16 NOTE — Progress Notes (Signed)
Hematology/Oncology follow up note [phone] [phone]   Patient Care Team: Tower, Audrie Gallus, MD as PCP - General Debbe Odea, MD as PCP - Cardiology (Cardiology) Vilinda Flake, [facility] (Inactive) as Pharmacist (Pharmacist) Rickard Patience, MD as Consulting Physician (Oncology)  ASSESSMENT & PLAN:   IDA (iron deficiency anemia) Iron deficiency anemia Chronic blood loss vs malabsorption.  She has had GI work up in the past.  Labs reviewed and discussed with patient.   Lab Results  Component Value Date   HGB 12.8 06/15/2023   TIBC 452 (H) 06/15/2023   IRONPCTSAT 19 06/15/2023   FERRITIN 45 06/15/2023   Improved hemoglobin and iron panel. Hold off Venofer.  Recommend GI work up. She will call GI office.  History of breast cancer Remote history of right breast cancer 1996 Continue annual screening mammogram.   B12 deficiency B12 level improved. Recommend sublingual B12 daily.  Will increase B12 injection intervals to every 2 months.   Orders Placed This Encounter  Procedures   CBC with Differential (Cancer Center Only)    Standing Status:   Future    Standing Expiration Date:   06/15/2024   Iron and TIBC    Standing Status:   Future    Standing Expiration Date:   06/15/2024   Ferritin    Standing Status:   Future    Standing Expiration Date:   06/15/2024   Vitamin B12    Standing Status:   Future    Standing Expiration Date:   06/15/2024   Folate    Standing Status:   Future    Standing Expiration Date:   06/15/2024   Follow up  6 months. Labs prior to MD visit +/- Venofer and B12 injections.  All questions were answered. The patient knows to call the clinic with any problems, questions or concerns.  Rickard Patience, MD, PhD [facility] Hematology Oncology 06/16/2023    CHIEF COMPLAINTS/REASON FOR VISIT:  Follow-up for iron deficiency anemia  HISTORY OF PRESENTING ILLNESS:  Martha Hamilton is a  72 y.o.  female with PMH listed below  who was referred to me for evaluation of anemia Reviewed patient's recent labs that was done.  08/08/20 Labs revealed anemia with hemoglobin of 9.2, MCV 93 .   Reviewed patient's previous labs ordered by primary care physician's office, anemia is chronic onset , duration is since 2013, with baseline in 11s, worsened during the recent 6 months, after she stops taking oral iron supplementation.   Associated signs and symptoms: Patient reports fatigue. denies SOB with exertion.  Denies weight loss, easy bruising, hematochezia, hemoptysis, hematuria. Context: History of GI bleeding: deneis               History of gastric restriction surgery for GERD                Last colonoscopy: 05/01/21. Last Endoscopy 05/01/21  Remote history of right breast cancer, s/p lumpectomy with node. She denies any previous chemotherapy or any anti estrogen therapy.     Family history positive for brother with esophageal cancer, mother with RCC.  I have recommended genetic testing.  She wanted to defer and if she changes her mind she will update me..   INTERVAL HISTORY Martha Hamilton is a 72 y.o. female who has above history reviewed by me today presents for follow up visit for iron deficiency anemia Patient has been on monthly vitamin B12 injection.  Today she reports feeling well.  Recent left  foot surgery. + fatigue. Denies melena or blood in stool. She has not scheduled a follow up appt with GI  Review of Systems  Constitutional:  Positive for fatigue. Negative for appetite change, chills and fever.  HENT:   Negative for hearing loss and voice change.   Eyes:  Negative for eye problems.  Respiratory:  Negative for chest tightness and cough.   Cardiovascular:  Negative for chest pain.  Gastrointestinal:  Negative for abdominal distention, abdominal pain and blood in stool.  Endocrine: Negative for hot flashes.  Genitourinary:  Negative for difficulty urinating and frequency.   Musculoskeletal:  Negative  for arthralgias.  Skin:  Negative for itching and rash.  Neurological:  Negative for extremity weakness.  Hematological:  Negative for adenopathy.  Psychiatric/Behavioral:  Negative for confusion.      MEDICAL HISTORY:  Past Medical History:  Diagnosis Date   Allergic rhinitis    Allergy    Asthma    Breast cancer [facility]) 1996   right breast   Cataract Oct. 2021   Depression 1981   DM2 (diabetes mellitus, type 2) (HCC)    GERD (gastroesophageal reflux disease)    Hx of cardiovascular stress test    a. Lex MV 2/14:  EF 69%, no ischemia   Hyperlipidemia    Hypertension    IDA (iron deficiency anemia) 08/27/2020   Iron deficiency anemia    Obesity    Osteoarthritis    hands   Ulcer     SURGICAL HISTORY: Past Surgical History:  Procedure Laterality Date   APPENDECTOMY  1976   BREAST LUMPECTOMY Right 1996   right breast, lumpectomy w/nodes   CATARACT EXTRACTION W/PHACO Left 11/28/2020   Procedure: CATARACT EXTRACTION PHACO AND INTRAOCULAR LENS PLACEMENT (IOC) LEFT DIABETIC;  Surgeon: Lockie Mola, MD;  Location: [facility];  Service: Ophthalmology;  Laterality: Left;  5.02 01:07.4 7.4%   CATARACT EXTRACTION W/PHACO Right 12/12/2020   Procedure: CATARACT EXTRACTION PHACO AND INTRAOCULAR LENS PLACEMENT (IOC) RIGHT DIABETIC 5.22 00:59.2;  Surgeon: Lockie Mola, MD;  Location: [facility];  Service: Ophthalmology;  Laterality: Right;   CHONDROPLASTY Left 02/10/2018   Procedure: CHONDROPLASTY;  Surgeon: Lyndle Herrlich, MD;  Location: [facility];  Service: Orthopedics;  Laterality: Left;   EYE SURGERY     FOOT SURGERY Left 07/2022   bone removal   GASTRIC RESTRICTION SURGERY     for reflux   HYSTEROSCOPY WITH D & C N/A 10/18/2012   Procedure: DILATATION AND CURETTAGE /HYSTEROSCOPY;  Surgeon: Catalina Antigua, MD;  Location: [facility];  Service: Gynecology;  Laterality: N/A;   KNEE ARTHROSCOPY WITH MEDIAL MENISECTOMY Left 02/10/2018   Procedure: KNEE  ARTHROSCOPY WITH MEDIAL and LATERAL MENISECTOMY;  Surgeon: Lyndle Herrlich, MD;  Location: [facility];  Service: Orthopedics;  Laterality: Left;   POLYPECTOMY N/A 10/18/2012   Procedure: POLYPECTOMY;  Surgeon: Catalina Antigua, MD;  Location: [facility];  Service: Gynecology;  Laterality: N/A;   SYNOVECTOMY Left 02/10/2018   Procedure: SYNOVECTOMY;  Surgeon: Lyndle Herrlich, MD;  Location: [facility];  Service: Orthopedics;  Laterality: Left;    SOCIAL HISTORY: Social History   Socioeconomic History   Marital status: Single    Spouse name: Not on file   Number of children: 0   Years of education: Not on file   Highest education level: Some college, no degree  Occupational History   Occupation: retired    Associate Professor: RETIRED  Tobacco Use   Smoking status: Never   Smokeless tobacco: Never  Tobacco comments:    Never smoked  Vaping Use   Vaping status: Never Used  Substance and Sexual Activity   Alcohol use: Never   Drug use: Never   Sexual activity: Not Currently    Birth control/protection: Post-menopausal  Other Topics Concern   Not on file  Social History Narrative   Lives with, and cares for her elderly mother.   Social Determinants of Health   Financial Resource Strain: Low Risk  (05/03/2023)   Overall Financial Resource Strain (CARDIA)    Difficulty of Paying Living Expenses: Not hard at all  Food Insecurity: No Food Insecurity (05/03/2023)   Hunger Vital Sign    Worried About Running Out of Food in the Last Year: Never true    Ran Out of Food in the Last Year: Never true  Transportation Needs: No Transportation Needs (05/03/2023)   PRAPARE - Administrator, Civil Service (Medical): No    Lack of Transportation (Non-Medical): No  Physical Activity: Inactive (05/03/2023)   Exercise Vital Sign    Days of Exercise per Week: 0 days    Minutes of Exercise per Session: 60 min  Stress: No Stress Concern Present (05/03/2023)   [employer] of Occupational Health  - Occupational Stress Questionnaire    Feeling of Stress : Only a little  Social Connections: Moderately Integrated (05/03/2023)   Social Connection and Isolation Panel [NHANES]    Frequency of Communication with Friends and Family: More than three times a week    Frequency of Social Gatherings with Friends and Family: More than three times a week    Attends Religious Services: More than 4 times per year    Active Member of [employer] or Organizations: Yes    Attends Banker Meetings: More than 4 times per year    Marital Status: Never married  Intimate Partner Violence: Not At Risk (01/22/2023)   Humiliation, Afraid, Rape, and Kick questionnaire    Fear of Current or Ex-Partner: No    Emotionally Abused: No    Physically Abused: No    Sexually Abused: No    FAMILY HISTORY: Family History  Problem Relation Age of Onset   Diabetes Mother    Heart disease Mother        Pacemaker, CHF   Hypertension Mother    Kidney cancer Mother    Cancer Mother    Stroke Father    CAD Father 69       Died with MI   Heart disease Father    Esophageal cancer Brother    Diabetes Brother    Heart disease Sister    Cancer Brother    Diabetes Sister    Colon cancer Neg Hx    Rectal cancer Neg Hx    Stomach cancer Neg Hx    Pancreatic cancer Neg Hx     ALLERGIES:  is allergic to cholestyramine, simvastatin, lipitor [atorvastatin], and niacin.  MEDICATIONS:  Current Outpatient Medications  Medication Sig Dispense Refill   ACCU-CHEK AVIVA PLUS test strip TEST BLOOD SUGAR EVERY DAY FOR DIABETES 100 strip 3   Accu-Chek Softclix Lancets lancets TEST BLOOD SUGAR EVERY DAY FOR DIABETES 100 each 1   acetaminophen (TYLENOL) 500 MG tablet Take 1,000 mg by mouth every 6 (six) hours as needed for mild pain.      Alcohol Swabs (DROPSAFE ALCOHOL PREP) 70 % PADS USE ONE TIME DAILY AS DIRECTED WHEN CHECKING BLOOD SUGAR 100 each 1   amitriptyline (ELAVIL) 25 MG tablet  TAKE 1 TABLET AT BEDTIME 90  tablet 1   amLODipine (NORVASC) 2.5 MG tablet Take 1 tablet (2.5 mg total) by mouth daily. 30 tablet 1   aspirin 81 MG tablet Take 81 mg by mouth at bedtime.      Bempedoic Acid (NEXLETOL) 180 MG TABS Take 1 tablet (180 mg total) by mouth daily. 30 tablet 2   Blood Glucose Calibration (ACCU-CHEK AVIVA) SOLN USE TO CALIBRATE METER AS DIRECTED 1 each 2   Blood Glucose Monitoring Suppl (ACCU-CHEK AVIVA PLUS) w/Device KIT Use to check blood sugar once daily for DM (dx. E11.9) 1 kit 0   calcium elemental as carbonate (BARIATRIC TUMS ULTRA) 400 MG chewable tablet Chew 1,000 mg by mouth daily as needed for heartburn.     Camphor-Menthol-Methyl Sal (SALONPAS) 3.07-19-08 % PTCH Apply topically.     Cholecalciferol (VITAMIN D) 2000 units tablet Take 2,000 Units by mouth daily.     Cyanocobalamin (B-12) 2500 MCG SUBL Place 1 tablet under the tongue daily. 30 tablet 5   Docusate Sodium (DSS) 100 MG CAPS Take 1 capsule by mouth 2 (two) times daily as needed.     ezetimibe (ZETIA) 10 MG tablet Take 1 tablet (10 mg total) by mouth daily. 90 tablet 0   fenofibrate (TRICOR) 145 MG tablet TAKE 1 TABLET EVERY DAY 90 tablet 1   gabapentin (NEURONTIN) 300 MG capsule Take 1 capsule (300 mg total) by mouth 3 (three) times daily as needed. 90 capsule 0   glipiZIDE (GLUCOTROL XL) 5 MG 24 hr tablet TAKE 1 TABLET EVERY DAY WITH BREAKFAST 90 tablet 1   loratadine (CLARITIN) 10 MG tablet Take 10 mg by mouth daily as needed for allergies.     losartan (COZAAR) 100 MG tablet Take 1 tablet (100 mg total) by mouth daily. 90 tablet 3   meclizine (ANTIVERT) 25 MG tablet Take 1 tablet (25 mg total) by mouth 3 (three) times daily as needed for dizziness. 90 tablet 3   metFORMIN (GLUCOPHAGE) 1000 MG tablet TAKE 1/2 TABLET TWICE DAILY WITH MEALS 90 tablet 1   metoprolol succinate (TOPROL-XL) 100 MG 24 hr tablet Take 1 tablet (100 mg total) by mouth daily. 90 tablet 0   omeprazole (PRILOSEC) 40 MG capsule TAKE 1 CAPSULE IN THE MORNING  AND AT BEDTIME. 180 capsule 3   ondansetron (ZOFRAN) 4 MG tablet TAKE 1 TABLET EVERY 4 HOURS AS NEEDED FOR NAUSEA AND VOMITING 90 tablet 0   SALINE NASAL SPRAY NA Place 1 spray into the nose daily as needed (congestion).      No current facility-administered medications for this visit.     PHYSICAL EXAMINATION: ECOG PERFORMANCE STATUS: 1 - Symptomatic but completely ambulatory Vitals:   06/16/23 1332  BP: 131/75  Pulse: (!) 101  Resp: 18  Temp: 98.5 F (36.9 C)  SpO2: 98%   Filed Weights   06/16/23 1332  Weight: 174 lb 3.2 oz (79 kg)    Physical Exam Constitutional:      General: She is not in acute distress. HENT:     Head: Normocephalic and atraumatic.  Eyes:     General: No scleral icterus. Cardiovascular:     Rate and Rhythm: Normal rate.  Pulmonary:     Effort: Pulmonary effort is normal. No respiratory distress.     Breath sounds: No wheezing.  Abdominal:     General: Bowel sounds are normal. There is no distension.     Palpations: Abdomen is soft.  Musculoskeletal:  General: Normal range of motion.     Cervical back: Normal range of motion and neck supple.  Skin:    Findings: No erythema or rash.  Neurological:     Mental Status: She is alert and oriented to person, place, and time. Mental status is at baseline.  Psychiatric:        Mood and Affect: Mood normal.      LABORATORY DATA:  I have reviewed the data as listed    Latest Ref Rng & Units 06/15/2023   11:14 AM 03/06/2023   11:35 AM 01/19/2023    7:53 AM  CBC  WBC 4.0 - 10.5 K/uL 7.2  7.6  6.6   Hemoglobin 12.0 - 15.0 g/dL 56.2  9.6  9.8   Hematocrit 36.0 - 46.0 % 39.2  31.8  31.3   Platelets 150 - 400 K/uL 334  436  433.0       Latest Ref Rng & Units 03/06/2023   11:35 AM 01/19/2023    7:53 AM 09/08/2022    2:04 PM  CMP  Glucose 70 - 99 mg/dL 130  865  784   BUN 8 - 23 mg/dL 27  23  21    Creatinine 0.44 - 1.00 mg/dL 6.96  2.95  2.84   Sodium 135 - 145 mmol/L 137  138  139    Potassium 3.5 - 5.1 mmol/L 4.0  4.4  4.4   Chloride 98 - 111 mmol/L 107  102  100   CO2 22 - 32 mmol/L 21  26  24    Calcium 8.9 - 10.3 mg/dL 9.3  9.0  9.1   Total Protein 6.5 - 8.1 g/dL 7.1  6.9  6.5   Total Bilirubin 0.3 - 1.2 mg/dL 0.3  0.2  0.7   Alkaline Phos 38 - 126 U/L 36  33  48   AST 15 - 41 U/L 19  13  33   ALT 0 - 44 U/L 13  11  17       Iron/TIBC/Ferritin/ %Sat    Component Value Date/Time   IRON 85 06/15/2023 1114   TIBC 452 (H) 06/15/2023 1114   FERRITIN 45 06/15/2023 1114   IRONPCTSAT 19 06/15/2023 1114

## 2023-06-17 DIAGNOSIS — M4802 Spinal stenosis, cervical region: Secondary | ICD-10-CM | POA: Diagnosis not present

## 2023-06-17 DIAGNOSIS — M5412 Radiculopathy, cervical region: Secondary | ICD-10-CM | POA: Diagnosis not present

## 2023-06-18 ENCOUNTER — Encounter: Payer: Self-pay | Admitting: Cardiology

## 2023-06-18 ENCOUNTER — Ambulatory Visit: Payer: Medicare PPO | Attending: Cardiology | Admitting: Cardiology

## 2023-06-18 VITALS — BP 154/78 | HR 92 | Ht 59.25 in | Wt 175.0 lb

## 2023-06-18 DIAGNOSIS — E781 Pure hyperglyceridemia: Secondary | ICD-10-CM | POA: Diagnosis not present

## 2023-06-18 DIAGNOSIS — Z6835 Body mass index (BMI) 35.0-35.9, adult: Secondary | ICD-10-CM

## 2023-06-18 DIAGNOSIS — I10 Essential (primary) hypertension: Secondary | ICD-10-CM

## 2023-06-18 MED ORDER — CARVEDILOL 25 MG PO TABS: 25.0000 mg | ORAL_TABLET | Freq: Two times a day (BID) | ORAL | 0 refills | Status: DC

## 2023-06-18 MED ORDER — AMLODIPINE BESYLATE 5 MG PO TABS: 5.0000 mg | ORAL_TABLET | Freq: Every day | ORAL | 0 refills | Status: DC

## 2023-06-18 NOTE — Patient Instructions (Signed)
Medication Instructions:   Your physician has recommended you make the following change in your medication:  STOP- Metoprolol Succinate.  START- Carvedilol 25 mg 1 tablet by mouth twice daily.  INCREASE- Amlodipine 5 mg 1 tablet by mouth daily.  *If you need a refill on your cardiac medications before your next appointment, please call your pharmacy*   Lab Work:  NONE  If you have labs (blood work) drawn today and your tests are completely normal, you will receive your results only by: MyChart Message (if you have MyChart) OR A paper copy in the mail If you have any lab test that is abnormal or we need to change your treatment, we will call you to review the results.   Testing/Procedures:  NONE   Follow-Up: At [facility], you and your health needs are our priority.  As part of our continuing mission to provide you with exceptional heart care, we have created designated Provider Care Teams.  These Care Teams include your primary Cardiologist (physician) and Advanced Practice Providers (APPs -  Physician Assistants and Nurse Practitioners) who all work together to provide you with the care you need, when you need it.  We recommend signing up for the patient portal called "MyChart".  Sign up information is provided on this After Visit Summary.  MyChart is used to connect with patients for Virtual Visits (Telemedicine).  Patients are able to view lab/test results, encounter notes, upcoming appointments, etc.  Non-urgent messages can be sent to your provider as well.   To learn more about what you can do with MyChart, go to [email or web address].    Your next appointment:   3 month(s)  Provider:   You may see Debbe Odea, MD or one of the following Advanced Practice Providers on your designated Care Team:   Nicolasa Ducking, NP Eula Listen, PA-C Cadence Fransico Michael, PA-C Charlsie Quest, NP Carlos Levering, NP

## 2023-06-18 NOTE — Progress Notes (Signed)
Cardiology Office Note:    Date:  06/18/2023   ID:  Martha, Hamilton May 04, 1951, MRN [id number]  PCP:  [name], MD   [facility] Providers Cardiologist:  Debbe Odea, MD     Referring MD: [name], MD   Chief Complaint  Patient presents with   Follow-up    Patient denies new or acute cardiac problems/concerns today.      History of Present Illness:    Martha Hamilton is a 72 y.o. female with a hx of hypertension, hyperlipidemia, diabetes, CKD 3 presenting for follow-up.   Blood pressures at home of late have been elevated, followed up with primary care physician, amlodipine 2.5 mg daily was started.  BP still ranging in the 150s to 160s on average.  Tolerating all medications as prescribed, heart rates better controlled with Toprol-XL.  Prior notes Echocardiogram 02/11/2022 EF 60 to 65% Cardiac monitor 02/05/2022 normal, no arrhythmias to suggest etiology of syncope.   Past Medical History:  Diagnosis Date   Allergic rhinitis    Allergy    Asthma    Breast cancer [facility]) 1996   right breast   Cataract Oct. 2021   Depression 1981   DM2 (diabetes mellitus, type 2) (HCC)    GERD (gastroesophageal reflux disease)    Hx of cardiovascular stress test    a. Lex MV 2/14:  EF 69%, no ischemia   Hyperlipidemia    Hypertension    IDA (iron deficiency anemia) 08/27/2020   Iron deficiency anemia    Obesity    Osteoarthritis    hands   Ulcer     Past Surgical History:  Procedure Laterality Date   APPENDECTOMY  1976   BREAST LUMPECTOMY Right 1996   right breast, lumpectomy w/nodes   CATARACT EXTRACTION W/PHACO Left 11/28/2020   Procedure: CATARACT EXTRACTION PHACO AND INTRAOCULAR LENS PLACEMENT (IOC) LEFT DIABETIC;  Surgeon: Lockie Mola, MD;  Location: [facility];  Service: Ophthalmology;  Laterality: Left;  5.02 01:07.4 7.4%   CATARACT EXTRACTION W/PHACO Right 12/12/2020   Procedure: CATARACT EXTRACTION PHACO AND  INTRAOCULAR LENS PLACEMENT (IOC) RIGHT DIABETIC 5.22 00:59.2;  Surgeon: Lockie Mola, MD;  Location: [facility];  Service: Ophthalmology;  Laterality: Right;   CHONDROPLASTY Left 02/10/2018   Procedure: CHONDROPLASTY;  Surgeon: Lyndle Herrlich, MD;  Location: [facility];  Service: Orthopedics;  Laterality: Left;   EYE SURGERY     FOOT SURGERY Left 07/2022   bone removal   GASTRIC RESTRICTION SURGERY     for reflux   HYSTEROSCOPY WITH D & C N/A 10/18/2012   Procedure: DILATATION AND CURETTAGE /HYSTEROSCOPY;  Surgeon: Catalina Antigua, MD;  Location: [facility];  Service: Gynecology;  Laterality: N/A;   KNEE ARTHROSCOPY WITH MEDIAL MENISECTOMY Left 02/10/2018   Procedure: KNEE ARTHROSCOPY WITH MEDIAL and LATERAL MENISECTOMY;  Surgeon: Lyndle Herrlich, MD;  Location: [facility];  Service: Orthopedics;  Laterality: Left;   POLYPECTOMY N/A 10/18/2012   Procedure: POLYPECTOMY;  Surgeon: Catalina Antigua, MD;  Location: [facility];  Service: Gynecology;  Laterality: N/A;   SYNOVECTOMY Left 02/10/2018   Procedure: SYNOVECTOMY;  Surgeon: Lyndle Herrlich, MD;  Location: [facility];  Service: Orthopedics;  Laterality: Left;    Current Medications: Current Meds  Medication Sig   ACCU-CHEK AVIVA PLUS test strip TEST BLOOD SUGAR EVERY DAY FOR DIABETES   Accu-Chek Softclix Lancets lancets TEST BLOOD SUGAR EVERY DAY FOR DIABETES   acetaminophen (TYLENOL) 500 MG tablet Take 1,000 mg by  mouth every 6 (six) hours as needed for mild pain.    Alcohol Swabs (DROPSAFE ALCOHOL PREP) 70 % PADS USE ONE TIME DAILY AS DIRECTED WHEN CHECKING BLOOD SUGAR   amitriptyline (ELAVIL) 25 MG tablet TAKE 1 TABLET AT BEDTIME   aspirin 81 MG tablet Take 81 mg by mouth at bedtime.    Bempedoic Acid (NEXLETOL) 180 MG TABS Take 1 tablet (180 mg total) by mouth daily.   Blood Glucose Calibration (ACCU-CHEK AVIVA) SOLN USE TO CALIBRATE METER AS DIRECTED   Blood Glucose Monitoring Suppl (ACCU-CHEK AVIVA PLUS) w/Device KIT Use to  check blood sugar once daily for DM (dx. E11.9)   calcium elemental as carbonate (BARIATRIC TUMS ULTRA) 400 MG chewable tablet Chew 1,000 mg by mouth daily as needed for heartburn.   Camphor-Menthol-Methyl Sal (SALONPAS) 3.07-19-08 % PTCH Apply topically.   carvedilol (COREG) 25 MG tablet Take 1 tablet (25 mg total) by mouth 2 (two) times daily.   Cholecalciferol (VITAMIN D) 2000 units tablet Take 2,000 Units by mouth daily.   Cyanocobalamin (B-12) 2500 MCG SUBL Place 1 tablet under the tongue daily.   Docusate Sodium (DSS) 100 MG CAPS Take 1 capsule by mouth 2 (two) times daily as needed.   ezetimibe (ZETIA) 10 MG tablet Take 1 tablet (10 mg total) by mouth daily.   fenofibrate (TRICOR) 145 MG tablet TAKE 1 TABLET EVERY DAY   gabapentin (NEURONTIN) 300 MG capsule Take 1 capsule (300 mg total) by mouth 3 (three) times daily as needed.   glipiZIDE (GLUCOTROL XL) 5 MG 24 hr tablet TAKE 1 TABLET EVERY DAY WITH BREAKFAST   loratadine (CLARITIN) 10 MG tablet Take 10 mg by mouth daily as needed for allergies.   losartan (COZAAR) 100 MG tablet Take 1 tablet (100 mg total) by mouth daily.   meclizine (ANTIVERT) 25 MG tablet Take 1 tablet (25 mg total) by mouth 3 (three) times daily as needed for dizziness.   metFORMIN (GLUCOPHAGE) 1000 MG tablet TAKE 1/2 TABLET TWICE DAILY WITH MEALS   omeprazole (PRILOSEC) 40 MG capsule TAKE 1 CAPSULE IN THE MORNING AND AT BEDTIME.   ondansetron (ZOFRAN) 4 MG tablet TAKE 1 TABLET EVERY 4 HOURS AS NEEDED FOR NAUSEA AND VOMITING   SALINE NASAL SPRAY NA Place 1 spray into the nose daily as needed (congestion).    [DISCONTINUED] amLODipine (NORVASC) 2.5 MG tablet Take 1 tablet (2.5 mg total) by mouth daily.   [DISCONTINUED] metoprolol succinate (TOPROL-XL) 100 MG 24 hr tablet Take 1 tablet (100 mg total) by mouth daily.     Allergies:   Cholestyramine, Simvastatin, Lipitor [atorvastatin], and Niacin   Social History   Socioeconomic History   Marital status: Single     Spouse name: Not on file   Number of children: 0   Years of education: Not on file   Highest education level: Some college, no degree  Occupational History   Occupation: retired    Associate Professor: RETIRED  Tobacco Use   Smoking status: Never   Smokeless tobacco: Never   Tobacco comments:    Never smoked  Vaping Use   Vaping status: Never Used  Substance and Sexual Activity   Alcohol use: Never   Drug use: Never   Sexual activity: Not Currently    Birth control/protection: Post-menopausal  Other Topics Concern   Not on file  Social History Narrative   Lives with, and cares for her elderly mother.   Social Determinants of Health   Financial Resource Strain: Low Risk  (  05/03/2023)   Overall Financial Resource Strain (CARDIA)    Difficulty of Paying Living Expenses: Not hard at all  Food Insecurity: No Food Insecurity (05/03/2023)   Hunger Vital Sign    Worried About Running Out of Food in the Last Year: Never true    Ran Out of Food in the Last Year: Never true  Transportation Needs: No Transportation Needs (05/03/2023)   PRAPARE - Administrator, Civil Service (Medical): No    Lack of Transportation (Non-Medical): No  Physical Activity: Inactive (05/03/2023)   Exercise Vital Sign    Days of Exercise per Week: 0 days    Minutes of Exercise per Session: 60 min  Stress: No Stress Concern Present (05/03/2023)   [employer] of Occupational Health - Occupational Stress Questionnaire    Feeling of Stress : Only a little  Social Connections: Moderately Integrated (05/03/2023)   Social Connection and Isolation Panel [NHANES]    Frequency of Communication with Friends and Family: More than three times a week    Frequency of Social Gatherings with Friends and Family: More than three times a week    Attends Religious Services: More than 4 times per year    Active Member of [employer] or Organizations: Yes    Attends Engineer, structural: More than 4 times per year     Marital Status: Never married     Family History: The patient's family history includes CAD (age of onset: 72) in her father; Cancer in her brother and mother; Diabetes in her brother, mother, and sister; Esophageal cancer in her brother; Heart disease in her father, mother, and sister; Hypertension in her mother; Kidney cancer in her mother; Stroke in her father. There is no history of Colon cancer, Rectal cancer, Stomach cancer, or Pancreatic cancer.  ROS:   Please see the history of present illness.     All other systems reviewed and are negative.  EKGs/Labs/Other Studies Reviewed:    The following studies were reviewed today:   EKG Interpretation Date/Time:  Thursday June 18 2023 09:30:28 EST Ventricular Rate:  92 PR Interval:  138 QRS Duration:  88 QT Interval:  352 QTC Calculation: 435 R Axis:   -17  Text Interpretation: Normal sinus rhythm Possible Left atrial enlargement Nonspecific ST abnormality Confirmed by Debbe Odea ([id number]) on 06/18/2023 9:41:43 AM    Recent Labs: 01/19/2023: TSH 0.69 03/06/2023: ALT 13; BUN 27; Creatinine 1.06; Potassium 4.0; Sodium 137 06/15/2023: Hemoglobin 12.8; Platelet Count 334  Recent Lipid Panel    Component Value Date/Time   CHOL 148 03/06/2023 1135   TRIG 308 (H) 03/06/2023 1135   HDL 39 (L) 03/06/2023 1135   CHOLHDL 3.8 03/06/2023 1135   VLDL 62 (H) 03/06/2023 1135   LDLCALC 47 03/06/2023 1135   LDLDIRECT 80.0 01/19/2023 0753     Risk Assessment/Calculations:         Physical Exam:    VS:  BP (!) 154/78 (BP Location: Left Arm, Patient Position: Sitting)   Pulse 92   Ht 4' 11.25" (1.505 m)   Wt 175 lb (79.4 kg)   LMP 06/13/2001   BMI 35.05 kg/m     Wt Readings from Last 3 Encounters:  06/18/23 175 lb (79.4 kg)  06/16/23 174 lb 3.2 oz (79 kg)  06/09/23 171 lb 6 oz (77.7 kg)     GEN:  Well nourished, well developed in no acute distress HEENT: Normal NECK: No JVD; No carotid bruits CARDIAC: RRR, no  murmurs, rubs,  gallops RESPIRATORY:  Clear to auscultation without rales, wheezing or rhonchi  ABDOMEN: Soft, non-tender, non-distended MUSCULOSKELETAL:  No edema; No deformity  SKIN: Warm and dry NEUROLOGIC:  Alert and oriented x 3 PSYCHIATRIC:  Normal affect   ASSESSMENT:    1. Primary hypertension   2. High triglycerides   3. BMI 35.0-35.9,adult    PLAN:    In order of problems listed above:  Hypertension, BP elevated .  Stop Toprol-XL.  Start Coreg 25 mg twice daily, increase Norvasc to 5 mg daily, continue losartan 100 mg daily. Elevated triglycerides, statin intolerant.  Continue Zetia, bempedoic acid, fenofibrate.  Low-cholesterol diet emphasized. Obesity, continue low-cholesterol, low calorie diet.  Follow-up in 3 months.     Medication Adjustments/Labs and Tests Ordered: Current medicines are reviewed at length with the patient today.  Concerns regarding medicines are outlined above.  Orders Placed This Encounter  Procedures   EKG 12-Lead   Meds ordered this encounter  Medications   amLODipine (NORVASC) 5 MG tablet    Sig: Take 1 tablet (5 mg total) by mouth daily.    Dispense:  90 tablet    Refill:  0   carvedilol (COREG) 25 MG tablet    Sig: Take 1 tablet (25 mg total) by mouth 2 (two) times daily.    Dispense:  180 tablet    Refill:  0    Patient Instructions  Medication Instructions:   Your physician has recommended you make the following change in your medication:  STOP- Metoprolol Succinate.  START- Carvedilol 25 mg 1 tablet by mouth twice daily.  INCREASE- Amlodipine 5 mg 1 tablet by mouth daily.  *If you need a refill on your cardiac medications before your next appointment, please call your pharmacy*   Lab Work:  NONE  If you have labs (blood work) drawn today and your tests are completely normal, you will receive your results only by: MyChart Message (if you have MyChart) OR A paper copy in the mail If you have any lab test that is  abnormal or we need to change your treatment, we will call you to review the results.   Testing/Procedures:  NONE   Follow-Up: At [facility], you and your health needs are our priority.  As part of our continuing mission to provide you with exceptional heart care, we have created designated Provider Care Teams.  These Care Teams include your primary Cardiologist (physician) and Advanced Practice Providers (APPs -  Physician Assistants and Nurse Practitioners) who all work together to provide you with the care you need, when you need it.  We recommend signing up for the patient portal called "MyChart".  Sign up information is provided on this After Visit Summary.  MyChart is used to connect with patients for Virtual Visits (Telemedicine).  Patients are able to view lab/test results, encounter notes, upcoming appointments, etc.  Non-urgent messages can be sent to your provider as well.   To learn more about what you can do with MyChart, go to [email or web address].    Your next appointment:   3 month(s)  Provider:   You may see Debbe Odea, MD or one of the following Advanced Practice Providers on your designated Care Team:   Nicolasa Ducking, NP Eula Listen, PA-C Cadence Fransico Michael, PA-C Charlsie Quest, NP Carlos Levering, NP    Signed, Debbe Odea, MD  06/18/2023 10:13 AM    [facility]

## 2023-06-23 ENCOUNTER — Other Ambulatory Visit: Payer: Self-pay

## 2023-06-23 MED ORDER — LOSARTAN POTASSIUM 100 MG PO TABS: 100.0000 mg | ORAL_TABLET | Freq: Every day | ORAL | 0 refills | Status: DC

## 2023-06-23 NOTE — Telephone Encounter (Signed)
Requested Prescriptions   Signed Prescriptions Disp Refills   losartan (COZAAR) 100 MG tablet 90 tablet 0    Sig: Take 1 tablet (100 mg total) by mouth daily.    Authorizing Provider: Debbe Odea    Ordering User: Guerry Minors   Last visit 06/18/23 with plan to f/u in 3 months.    Next visit: 09/16/23

## 2023-06-24 ENCOUNTER — Other Ambulatory Visit: Payer: Self-pay | Admitting: Family Medicine

## 2023-06-24 DIAGNOSIS — E119 Type 2 diabetes mellitus without complications: Secondary | ICD-10-CM

## 2023-06-25 ENCOUNTER — Other Ambulatory Visit: Payer: Self-pay | Admitting: *Deleted

## 2023-06-25 DIAGNOSIS — E119 Type 2 diabetes mellitus without complications: Secondary | ICD-10-CM

## 2023-06-25 MED ORDER — GLIPIZIDE ER 5 MG PO TB24: 5.0000 mg | ORAL_TABLET | Freq: Every day | ORAL | 2 refills | Status: DC

## 2023-06-25 NOTE — Telephone Encounter (Signed)
Gabapentin last filled on 05/06/23 #90 caps/ 0 refills   Metformin last filled on 11/21/22 #90 tabs/ 1 refill  F/u scheduled 06/29/23

## 2023-06-26 ENCOUNTER — Encounter: Payer: Self-pay | Admitting: Podiatry

## 2023-06-26 ENCOUNTER — Ambulatory Visit: Payer: Medicare PPO | Admitting: Podiatry

## 2023-06-26 VITALS — Ht 59.25 in | Wt 175.0 lb

## 2023-06-26 DIAGNOSIS — M7751 Other enthesopathy of right foot: Secondary | ICD-10-CM

## 2023-06-26 NOTE — Progress Notes (Signed)
Chief Complaint  Patient presents with   Foot Pain    Pt is here to f/u on right foot pain pt states foot feels good and that the shot hit the spot.    HPI: 72 y.o. female presenting today for follow-up evaluation of an injury to the right great toe.  She says she is doing well.  The injection seems to have helped tremendously.  She no longer has any pain or tenderness.  Past Medical History:  Diagnosis Date   Allergic rhinitis    Allergy    Asthma    Breast cancer [facility]) 1996   right breast   Cataract Oct. 2021   Depression 1981   DM2 (diabetes mellitus, type 2) (HCC)    GERD (gastroesophageal reflux disease)    Hx of cardiovascular stress test    a. Lex MV 2/14:  EF 69%, no ischemia   Hyperlipidemia    Hypertension    IDA (iron deficiency anemia) 08/27/2020   Iron deficiency anemia    Obesity    Osteoarthritis    hands   Ulcer     Past Surgical History:  Procedure Laterality Date   APPENDECTOMY  1976   BREAST LUMPECTOMY Right 1996   right breast, lumpectomy w/nodes   CATARACT EXTRACTION W/PHACO Left 11/28/2020   Procedure: CATARACT EXTRACTION PHACO AND INTRAOCULAR LENS PLACEMENT (IOC) LEFT DIABETIC;  Surgeon: Lockie Mola, MD;  Location: [facility];  Service: Ophthalmology;  Laterality: Left;  5.02 01:07.4 7.4%   CATARACT EXTRACTION W/PHACO Right 12/12/2020   Procedure: CATARACT EXTRACTION PHACO AND INTRAOCULAR LENS PLACEMENT (IOC) RIGHT DIABETIC 5.22 00:59.2;  Surgeon: Lockie Mola, MD;  Location: [facility];  Service: Ophthalmology;  Laterality: Right;   CHONDROPLASTY Left 02/10/2018   Procedure: CHONDROPLASTY;  Surgeon: Lyndle Herrlich, MD;  Location: [facility];  Service: Orthopedics;  Laterality: Left;   EYE SURGERY     FOOT SURGERY Left 07/2022   bone removal   GASTRIC RESTRICTION SURGERY     for reflux   HYSTEROSCOPY WITH D & C N/A 10/18/2012   Procedure: DILATATION AND CURETTAGE /HYSTEROSCOPY;  Surgeon: Catalina Antigua, MD;   Location: [facility];  Service: Gynecology;  Laterality: N/A;   KNEE ARTHROSCOPY WITH MEDIAL MENISECTOMY Left 02/10/2018   Procedure: KNEE ARTHROSCOPY WITH MEDIAL and LATERAL MENISECTOMY;  Surgeon: Lyndle Herrlich, MD;  Location: [facility];  Service: Orthopedics;  Laterality: Left;   POLYPECTOMY N/A 10/18/2012   Procedure: POLYPECTOMY;  Surgeon: Catalina Antigua, MD;  Location: [facility];  Service: Gynecology;  Laterality: N/A;   SYNOVECTOMY Left 02/10/2018   Procedure: SYNOVECTOMY;  Surgeon: Lyndle Herrlich, MD;  Location: [facility];  Service: Orthopedics;  Laterality: Left;    Allergies  Allergen Reactions   Cholestyramine Other (See Comments)    REACTION: Muscles tightened up, drew up.   Simvastatin Other (See Comments)    Muscular pain   Lipitor [Atorvastatin] Other (See Comments)    Muscle cramping   Niacin Nausea And Vomiting     Physical Exam: General: The patient is alert and oriented x3 in no acute distress.  Dermatology: Skin is warm, dry and supple bilateral lower extremities.   Vascular: Palpable pedal pulses bilaterally. Capillary refill within normal limits.  No appreciable edema.  No erythema.  Neurological: Grossly intact via light touch  Musculoskeletal Exam: No pedal deformities noted.  No noted with palpation along the extensor hallucis longus just proximal to the first MTP.  There is no tenderness or symptoms with palpation  or range of motion of the first MTP.  Muscle strength 5/5 all compartments.  Tendons are intact with adequate resistance against pressure  Radiographic Exam RT foot 06/02/2023:  Normal osseous mineralization.  No fractures noted.  Generalized degenerative changes noted throughout the pedal joints of the foot.  Impression: Negative  Assessment/Plan of Care: 1.  EHL tendinitis right great toe; resolved  -Patient evaluated -Patient may discontinue the postop shoe -Patient now has no pain or tenderness associated to the EHL.  Pain is completely  resolved -Full activity no restrictions -Clinic PRN       Felecia Shelling, DPM [facility]  Dr. Felecia Shelling, DPM    [address] [place], [place] [address]                Office [phone]  Fax [phone]

## 2023-06-29 ENCOUNTER — Ambulatory Visit: Payer: Medicare PPO | Admitting: Family Medicine

## 2023-06-29 ENCOUNTER — Encounter: Payer: Self-pay | Admitting: Family Medicine

## 2023-06-29 ENCOUNTER — Encounter: Payer: Self-pay | Admitting: *Deleted

## 2023-06-29 VITALS — BP 134/72 | HR 85 | Temp 98.1°F | Ht 59.25 in | Wt 175.2 lb

## 2023-06-29 DIAGNOSIS — E785 Hyperlipidemia, unspecified: Secondary | ICD-10-CM

## 2023-06-29 DIAGNOSIS — E538 Deficiency of other specified B group vitamins: Secondary | ICD-10-CM

## 2023-06-29 DIAGNOSIS — E041 Nontoxic single thyroid nodule: Secondary | ICD-10-CM | POA: Diagnosis not present

## 2023-06-29 DIAGNOSIS — M4712 Other spondylosis with myelopathy, cervical region: Secondary | ICD-10-CM

## 2023-06-29 DIAGNOSIS — I10 Essential (primary) hypertension: Secondary | ICD-10-CM

## 2023-06-29 DIAGNOSIS — E119 Type 2 diabetes mellitus without complications: Secondary | ICD-10-CM

## 2023-06-29 DIAGNOSIS — D473 Essential (hemorrhagic) thrombocythemia: Secondary | ICD-10-CM

## 2023-06-29 DIAGNOSIS — K219 Gastro-esophageal reflux disease without esophagitis: Secondary | ICD-10-CM

## 2023-06-29 HISTORY — DX: Nontoxic single thyroid nodule: E04.1

## 2023-06-29 NOTE — Patient Instructions (Addendum)
I put the referral in for a thyroid ultrasound  Please let us know if you don't hear in 1-2 weeks   If you develop neck pain or pressure from the front or new swallowing issues let us know

## 2023-06-29 NOTE — Assessment & Plan Note (Signed)
Going to [facility]  Reviewed MRI today  Planning injection at end of the week

## 2023-06-29 NOTE — Progress Notes (Signed)
Subjective:    Patient ID: Martha Hamilton, female    DOB: 05-11-1951, 72 y.o.   MRN: [id number]  HPI  Wt Readings from Last 3 Encounters:  06/29/23 175 lb 4 oz (79.5 kg)  06/26/23 175 lb (79.4 kg)  06/18/23 175 lb (79.4 kg)   35.10 kg/m  Vitals:   06/29/23 0919  BP: 134/72  Pulse: 85  Temp: 98.1 F (36.7 C)  SpO2: 95%    Wants to follow up regarding thyroid   Had MRI at [facility]  Showed a complex partly cystic mass in left paratracheal region 4.9 by 4.4 cm  Likely complex thyroid nodule   Has some trouble swallowing meat at times -makes her cough  No pressure  Cannot feel anything in her neck (front   No thyroid issues in family    Lab Results  Component Value Date   TSH 0.69 01/19/2023   Some disk disease in neck  Has injection upcoming for that  Causes sore and stiff neck (posterior)  Some spondylosis of C4-5 and disk bulge C5-6 With some spinal stenosis    Patient Active Problem List   Diagnosis Date Noted   Thyroid nodule 06/29/2023   Encounter for hepatitis C screening test for low risk patient 06/09/2023   Pedal edema 11/12/2022   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   Muscle cramps 02/28/2022   Neck pain 02/28/2022   Syncope 12/18/2021   Strain of flexor tendon of wrist 12/02/2021   Tendinitis of left wrist 12/02/2021   Left wrist pain 11/13/2021   Family history of cancer 08/27/2020   History of breast cancer 08/27/2020   IDA (iron deficiency anemia) 08/27/2020   B12 deficiency 08/27/2020   Chronic kidney disease (CKD) stage G3b/A1, moderately decreased glomerular filtration rate (GFR) between 30-44 mL/min/1.73 square meter and albuminuria creatinine ratio less than 30 mg/g (HCC) 07/24/2020   Tachycardia 01/11/2019   Fatty liver 06/21/2018   Obesity (BMI 30-39.9) 06/21/2018   Dyspepsia 05/25/2018   GERD (gastroesophageal reflux disease) 05/25/2018   H/O vertigo 05/11/2018   Essential (hemorrhagic)  thrombocythemia (HCC) 12/25/2017   Tear of medial meniscus of knee 12/21/2017   Pain in left knee 12/21/2017   Left knee pain 09/21/2017   Welcome to Medicare preventive visit 12/17/2016   Grief reaction 12/17/2016   Estrogen deficiency 09/08/2016   Myalgia 02/08/2016   Normocytic anemia 02/09/2013   Abnormal EKG 07/27/2012   Special screening for malignant neoplasms, colon 04/29/2011   Encounter for gynecological examination 04/29/2011   Routine general medical examination at a health care facility 04/20/2011   Osteoarthrosis, hand 10/31/2008   Diabetes type 2, controlled (HCC) 07/10/2008   Hyperlipidemia associated with type 2 diabetes mellitus (HCC) 01/26/2008   Essential hypertension 01/26/2008   Allergic rhinitis 12/07/2007   Past Medical History:  Diagnosis Date   Allergic rhinitis    Allergy    Asthma    Breast cancer (HCC) 1996   right breast   Cataract Oct. 2021   Depression 1981   DM2 (diabetes mellitus, type 2) (HCC)    GERD (gastroesophageal reflux disease)    Hx of cardiovascular stress test    a. Lex MV 2/14:  EF 69%, no ischemia   Hyperlipidemia    Hypertension    IDA (iron deficiency anemia) 08/27/2020   Iron deficiency anemia    Obesity    Osteoarthritis    hands   Thyroid nodule 06/29/2023   Ulcer    Past  Surgical History:  Procedure Laterality Date   APPENDECTOMY  1976   BREAST LUMPECTOMY Right 1996   right breast, lumpectomy w/nodes   CATARACT EXTRACTION W/PHACO Left 11/28/2020   Procedure: CATARACT EXTRACTION PHACO AND INTRAOCULAR LENS PLACEMENT (IOC) LEFT DIABETIC;  Surgeon: Lockie Mola, MD;  Location: [facility];  Service: Ophthalmology;  Laterality: Left;  5.02 01:07.4 7.4%   CATARACT EXTRACTION W/PHACO Right 12/12/2020   Procedure: CATARACT EXTRACTION PHACO AND INTRAOCULAR LENS PLACEMENT (IOC) RIGHT DIABETIC 5.22 00:59.2;  Surgeon: Lockie Mola, MD;  Location: [facility];  Service: Ophthalmology;   Laterality: Right;   CHONDROPLASTY Left 02/10/2018   Procedure: CHONDROPLASTY;  Surgeon: Lyndle Herrlich, MD;  Location: [facility];  Service: Orthopedics;  Laterality: Left;   EYE SURGERY     FOOT SURGERY Left 07/2022   bone removal   GASTRIC RESTRICTION SURGERY     for reflux   HYSTEROSCOPY WITH D & C N/A 10/18/2012   Procedure: DILATATION AND CURETTAGE /HYSTEROSCOPY;  Surgeon: Catalina Antigua, MD;  Location: [facility];  Service: Gynecology;  Laterality: N/A;   KNEE ARTHROSCOPY WITH MEDIAL MENISECTOMY Left 02/10/2018   Procedure: KNEE ARTHROSCOPY WITH MEDIAL and LATERAL MENISECTOMY;  Surgeon: Lyndle Herrlich, MD;  Location: [facility];  Service: Orthopedics;  Laterality: Left;   POLYPECTOMY N/A 10/18/2012   Procedure: POLYPECTOMY;  Surgeon: Catalina Antigua, MD;  Location: [facility];  Service: Gynecology;  Laterality: N/A;   SYNOVECTOMY Left 02/10/2018   Procedure: SYNOVECTOMY;  Surgeon: Lyndle Herrlich, MD;  Location: [facility];  Service: Orthopedics;  Laterality: Left;   Social History   Tobacco Use   Smoking status: Never   Smokeless tobacco: Never   Tobacco comments:    Never smoked  Vaping Use   Vaping status: Never Used  Substance Use Topics   Alcohol use: Never   Drug use: Never   Family History  Problem Relation Age of Onset   Diabetes Mother    Heart disease Mother        Pacemaker, CHF   Hypertension Mother    Kidney cancer Mother    Cancer Mother    Stroke Father    CAD Father 15       Died with MI   Heart disease Father    Esophageal cancer Brother    Diabetes Brother    Heart disease Sister    Cancer Brother    Diabetes Sister    Colon cancer Neg Hx    Rectal cancer Neg Hx    Stomach cancer Neg Hx    Pancreatic cancer Neg Hx    Allergies  Allergen Reactions   Cholestyramine Other (See Comments)    REACTION: Muscles tightened up, drew up.   Simvastatin Other (See Comments)    Muscular pain   Lipitor [Atorvastatin] Other (See Comments)    Muscle cramping    Niacin Nausea And Vomiting   Current Outpatient Medications on File Prior to Visit  Medication Sig Dispense Refill   ACCU-CHEK AVIVA PLUS test strip TEST BLOOD SUGAR EVERY DAY FOR DIABETES 100 strip 3   Accu-Chek Softclix Lancets lancets TEST BLOOD SUGAR EVERY DAY FOR DIABETES 100 each 1   acetaminophen (TYLENOL) 500 MG tablet Take 1,000 mg by mouth every 6 (six) hours as needed for mild pain.      Alcohol Swabs (DROPSAFE ALCOHOL PREP) 70 % PADS USE ONE TIME DAILY AS DIRECTED WHEN CHECKING BLOOD SUGAR 100 each 1   amitriptyline (ELAVIL) 25 MG tablet TAKE 1 TABLET  AT BEDTIME 90 tablet 1   amLODipine (NORVASC) 5 MG tablet Take 1 tablet (5 mg total) by mouth daily. 90 tablet 0   aspirin 81 MG tablet Take 81 mg by mouth at bedtime.      Blood Glucose Calibration (ACCU-CHEK AVIVA) SOLN USE TO CALIBRATE METER AS DIRECTED 1 each 2   Blood Glucose Monitoring Suppl (ACCU-CHEK AVIVA PLUS) w/Device KIT Use to check blood sugar once daily for DM (dx. E11.9) 1 kit 0   calcium elemental as carbonate (BARIATRIC TUMS ULTRA) 400 MG chewable tablet Chew 1,000 mg by mouth daily as needed for heartburn.     Camphor-Menthol-Methyl Sal (SALONPAS) 3.07-19-08 % PTCH Apply topically.     carvedilol (COREG) 25 MG tablet Take 1 tablet (25 mg total) by mouth 2 (two) times daily. 180 tablet 0   Cholecalciferol (VITAMIN D) 2000 units tablet Take 2,000 Units by mouth daily.     Cyanocobalamin (B-12) 2500 MCG SUBL Place 1 tablet under the tongue daily. 30 tablet 5   Docusate Sodium (STOOL SOFTENER) 100 MG capsule Take 100 mg by mouth in the morning and at bedtime.     ezetimibe (ZETIA) 10 MG tablet Take 1 tablet (10 mg total) by mouth daily. 90 tablet 0   fenofibrate (TRICOR) 145 MG tablet TAKE 1 TABLET EVERY DAY 90 tablet 1   Ferrous Sulfate (IRON PO) Take 65 mg by mouth daily.     gabapentin (NEURONTIN) 300 MG capsule TAKE 1 CAPSULE THREE TIMES DAILY AS NEEDED 90 capsule 3   glipiZIDE (GLUCOTROL XL) 5 MG 24 hr tablet Take 1  tablet (5 mg total) by mouth daily with breakfast. 90 tablet 2   loratadine (CLARITIN) 10 MG tablet Take 10 mg by mouth daily as needed for allergies.     losartan (COZAAR) 100 MG tablet Take 1 tablet (100 mg total) by mouth daily. 90 tablet 0   meclizine (ANTIVERT) 25 MG tablet Take 1 tablet (25 mg total) by mouth 3 (three) times daily as needed for dizziness. 90 tablet 3   metFORMIN (GLUCOPHAGE) 1000 MG tablet TAKE 1/2 TABLET TWICE DAILY WITH MEALS 90 tablet 2   omeprazole (PRILOSEC) 40 MG capsule TAKE 1 CAPSULE IN THE MORNING AND AT BEDTIME. 180 capsule 3   ondansetron (ZOFRAN) 4 MG tablet TAKE 1 TABLET EVERY 4 HOURS AS NEEDED FOR NAUSEA AND VOMITING 90 tablet 0   SALINE NASAL SPRAY NA Place 1 spray into the nose daily as needed (congestion).      No current facility-administered medications on file prior to visit.    Review of Systems  Constitutional:  Negative for activity change, appetite change, fatigue, fever and unexpected weight change.  HENT:  Negative for congestion, ear pain, rhinorrhea, sinus pressure and sore throat.        Occational trouble swallowing meat  Eyes:  Negative for pain, redness and visual disturbance.  Respiratory:  Negative for cough, shortness of breath and wheezing.   Cardiovascular:  Negative for chest pain and palpitations.  Gastrointestinal:  Negative for abdominal pain, blood in stool, constipation and diarrhea.  Endocrine: Negative for polydipsia and polyuria.  Genitourinary:  Negative for dysuria, frequency and urgency.  Musculoskeletal:  Positive for neck pain and neck stiffness. Negative for arthralgias, back pain and myalgias.  Skin:  Negative for pallor and rash.  Allergic/Immunologic: Negative for environmental allergies.  Neurological:  Negative for dizziness, syncope and headaches.  Hematological:  Negative for adenopathy. Does not bruise/bleed easily.  Psychiatric/Behavioral:  Negative for  decreased concentration and dysphoric mood. The  patient is not nervous/anxious.        Objective:   Physical Exam Constitutional:      General: She is not in acute distress.    Appearance: Normal appearance. She is obese. She is not ill-appearing or diaphoretic.  Eyes:     General: No scleral icterus.    Conjunctiva/sclera: Conjunctivae normal.     Pupils: Pupils are equal, round, and reactive to light.  Neck:     Vascular: No carotid bruit.     Comments: No palpable thyromegaly noted   No anterior neck tenderness  Some limited rom of CS due to posterior pain  Mild crepitus  Musculoskeletal:     Cervical back: Neck supple.  Lymphadenopathy:     Cervical: No cervical adenopathy.  Skin:    General: Skin is warm and dry.     Findings: No bruising, erythema or rash.  Neurological:     Mental Status: She is alert.     Sensory: No sensory deficit.     Motor: No weakness.     Comments: No tremor   Psychiatric:        Mood and Affect: Mood normal.           Assessment & Plan:   Problem List Items Addressed This Visit       Endocrine   Thyroid nodule - Primary   4.9 by 4.4 cystic mass seen in L paratracheal area on MRI  Likely complex thyroid nodule   No new symptoms besides occational trouble swallowing meat  No changes on exam  Thyroid US ordered       Relevant Orders   US THYROID     Musculoskeletal and Integument   Cervical spine degeneration   Going to [facility]  Reviewed MRI today  Planning injection at end of the week

## 2023-06-29 NOTE — Assessment & Plan Note (Addendum)
4.9 by 4.4 cystic mass seen in L paratracheal area on MRI  Likely complex thyroid nodule   No new symptoms besides occational trouble swallowing meat  No changes on exam  Thyroid US ordered   Addendum- US thyroid nodule met criteria for bx so endocrinology referral was done

## 2023-07-02 ENCOUNTER — Ambulatory Visit
Admission: RE | Admit: 2023-07-02 | Discharge: 2023-07-02 | Disposition: A | Discharge status: Home / Self Care | Payer: Medicare PPO | Source: Ambulatory Visit | Attending: Family Medicine | Admitting: Family Medicine

## 2023-07-02 DIAGNOSIS — E041 Nontoxic single thyroid nodule: Secondary | ICD-10-CM | POA: Diagnosis not present

## 2023-07-06 ENCOUNTER — Ambulatory Visit
Admission: RE | Admit: 2023-07-06 | Discharge: 2023-07-06 | Disposition: A | Discharge status: Home / Self Care | Payer: Medicare PPO | Source: Ambulatory Visit | Attending: Family Medicine | Admitting: Family Medicine

## 2023-07-06 DIAGNOSIS — Z1231 Encounter for screening mammogram for malignant neoplasm of breast: Secondary | ICD-10-CM | POA: Diagnosis not present

## 2023-07-07 ENCOUNTER — Encounter: Payer: Self-pay | Admitting: Family Medicine

## 2023-07-09 NOTE — Addendum Note (Signed)
Addended by: Roxy Manns A on: 07/09/2023 09:51 AM   Modules accepted: Orders

## 2023-07-17 DIAGNOSIS — M5412 Radiculopathy, cervical region: Secondary | ICD-10-CM | POA: Diagnosis not present

## 2023-07-17 DIAGNOSIS — E119 Type 2 diabetes mellitus without complications: Secondary | ICD-10-CM | POA: Diagnosis not present

## 2023-07-23 ENCOUNTER — Other Ambulatory Visit: Payer: Self-pay | Admitting: Family Medicine

## 2023-07-24 ENCOUNTER — Other Ambulatory Visit: Payer: Self-pay | Admitting: Family Medicine

## 2023-07-24 DIAGNOSIS — M1712 Unilateral primary osteoarthritis, left knee: Secondary | ICD-10-CM | POA: Diagnosis not present

## 2023-07-27 ENCOUNTER — Other Ambulatory Visit: Payer: Self-pay

## 2023-07-27 ENCOUNTER — Other Ambulatory Visit: Payer: Self-pay | Admitting: Podiatry

## 2023-07-27 MED ORDER — EZETIMIBE 10 MG PO TABS: 10.0000 mg | ORAL_TABLET | Freq: Every day | ORAL | 2 refills | Status: DC

## 2023-07-27 NOTE — Telephone Encounter (Signed)
 Requested Prescriptions   Signed Prescriptions Disp Refills   ezetimibe  (ZETIA ) 10 MG tablet 90 tablet 2    Sig: Take 1 tablet (10 mg total) by mouth daily.    Authorizing Provider: DARLISS ROGUE    Ordering User: CLAUDENE POWELL CROME    Last office visit: 06/18/23 with plan to f/u 3 months. next office visit: 3/5/825

## 2023-08-10 ENCOUNTER — Other Ambulatory Visit: Payer: Self-pay

## 2023-08-10 DIAGNOSIS — E041 Nontoxic single thyroid nodule: Secondary | ICD-10-CM

## 2023-08-14 ENCOUNTER — Other Ambulatory Visit: Payer: Medicare PPO

## 2023-08-14 DIAGNOSIS — E041 Nontoxic single thyroid nodule: Secondary | ICD-10-CM | POA: Diagnosis not present

## 2023-08-14 LAB — T4, FREE: Free T4: 1.4 ng/dL (ref 0.8–1.8)

## 2023-08-14 LAB — T3, FREE: T3, Free: 3 pg/mL (ref 2.3–4.2)

## 2023-08-14 LAB — TSH: TSH: 0.86 m[IU]/L (ref 0.40–4.50)

## 2023-08-18 ENCOUNTER — Inpatient Hospital Stay: Payer: Medicare PPO | Attending: Internal Medicine

## 2023-08-18 DIAGNOSIS — E538 Deficiency of other specified B group vitamins: Secondary | ICD-10-CM | POA: Insufficient documentation

## 2023-08-18 DIAGNOSIS — D508 Other iron deficiency anemias: Secondary | ICD-10-CM

## 2023-08-18 MED ORDER — CYANOCOBALAMIN 1000 MCG/ML IJ SOLN
1000.0000 ug | Freq: Once | INTRAMUSCULAR | Status: AC
  Administered 2023-08-18: 1000 ug via INTRAMUSCULAR
  Filled 2023-08-18: qty 1

## 2023-08-19 ENCOUNTER — Encounter: Payer: Self-pay | Admitting: "Endocrinology

## 2023-08-19 ENCOUNTER — Ambulatory Visit: Payer: Medicare PPO | Admitting: "Endocrinology

## 2023-08-19 VITALS — BP 132/70 | HR 85 | Resp 20 | Ht 59.5 in | Wt 178.4 lb

## 2023-08-19 DIAGNOSIS — E042 Nontoxic multinodular goiter: Secondary | ICD-10-CM

## 2023-08-19 NOTE — Progress Notes (Signed)
 Outpatient Endocrinology Note Martha Birmingham, MD  08/19/23   Martha Hamilton [facility] 1950/07/17 [id number]  Referring Provider: Randeen Laine LABOR, MD Primary Care Provider: Randeen Laine LABOR, MD Subjective  No chief complaint on file.   Assessment & Plan  Diagnoses and all orders for this visit:  Multinodular goiter -     US  FNA BX THYROID  1ST LESION AFIRMA; Future -     US  FNA BIOPSY THYROID  EA ADD LESION AFIRMA; Future -     US  FNA BIOPSY THYROID  EA ADD LESION AFIRMA -     US  FNA BX THYROID  1ST LESION AFIRMA    Martha Hamilton is currently not taking any thyroid  medication. Patient is currently biochemically euthyroid.  Educated on thyroid  axis.  Repeat lab sooner if symptoms of hyperthyroidism or hypothyroidism develop.  Notify us  immediately in case of significant weight gain or loss. Counseled on compliance and follow up needs.  06/2023 thyroid  U/S reported multinodular goiter Ordered FNA for Isthmus; left 2.3 cm x 1.5 cm x 1.5 cm TR3 nodule as well as left thyroid , 6.7 cm x 4.6 cm x 4.6 cm TR4 nodule Other nodules are 1 cm or less and do not meet criteria for FNA Pemberton's sign -ve History of  esophageal dilation 8 times with improvement after in dysphagia   I have reviewed current medications, nurse's notes, allergies, vital signs, past medical and surgical history, family medical history, and social history for this encounter. Counseled patient on symptoms, examination findings, lab findings, imaging results, treatment decisions and monitoring and prognosis. The patient understood the recommendations and agrees with the treatment plan. All questions regarding treatment plan were fully answered.   Return in about 1 year (around 08/18/2024).   Martha Birmingham, MD  08/19/23   I have reviewed current medications, nurse's notes, allergies, vital signs, past medical and surgical history, family medical history, and social history for this encounter. Counseled patient on  symptoms, examination findings, lab findings, imaging results, treatment decisions and monitoring and prognosis. The patient understood the recommendations and agrees with the treatment plan. All questions regarding treatment plan were fully answered.   History of Present Illness Martha Hamilton is a 73 y.o. year old female who presents to our clinic with incidentally found thyroid  nodule on MRI leading to thyroid  nodule diagnosed in 2024.    Never been on any thyroid  medication.   No other complaints around temperature intolerance or bowel movement.   Not happy with weight and has difficulty losing it   Compressive symptoms:  dysphagia  Yes, s/p esophageal dilation 8 times dysphonia  Yes positional dyspnea (especially with simultaneous arms elevation)  No  Smokes  No On biotin  No Personal history of head/neck surgery/irradiation  No  Physical Exam  BP 132/70 (BP Location: Left Arm, Patient Position: Sitting, Cuff Size: Large)   Pulse 85   Resp 20   Ht 4' 11.5 (1.511 m)   Wt 178 lb 6.4 oz (80.9 kg)   LMP 06/13/2001   SpO2 95%   BMI 35.43 kg/m  Constitutional: well developed, well nourished Head: normocephalic, atraumatic, no exophthalmos Eyes: sclera anicteric, no redness Neck: + thyromegaly, no thyroid  tenderness; + nodules palpated, Pemberton's sign -ve Lungs: normal respiratory effort Neurology: alert and oriented, no fine hand tremor Skin: dry, no appreciable rashes Musculoskeletal: no appreciable defects Psychiatric: normal mood and affect  Allergies Allergies  Allergen Reactions   Cholestyramine Other (See Comments)    REACTION: Muscles tightened up, drew up.  Simvastatin  Other (See Comments)    Muscular pain   Lipitor [Atorvastatin] Other (See Comments)    Muscle cramping   Niacin Nausea And Vomiting    Current Medications Patient's Medications  New Prescriptions   No medications on file  Previous Medications   ACCU-CHEK AVIVA PLUS TEST STRIP     TEST BLOOD SUGAR EVERY DAY FOR DIABETES   ACCU-CHEK SOFTCLIX LANCETS LANCETS    TEST BLOOD SUGAR EVERY DAY FOR DIABETES   ACETAMINOPHEN  (TYLENOL ) 500 MG TABLET    Take 1,000 mg by mouth every 6 (six) hours as needed for mild pain.    ALCOHOL SWABS (DROPSAFE ALCOHOL PREP) 70 % PADS    USE ONE TIME DAILY AS DIRECTED WHEN CHECKING BLOOD SUGAR   AMITRIPTYLINE  (ELAVIL ) 25 MG TABLET    TAKE 1 TABLET AT BEDTIME   AMLODIPINE  (NORVASC ) 5 MG TABLET    Take 1 tablet (5 mg total) by mouth daily.   ASPIRIN 81 MG TABLET    Take 81 mg by mouth at bedtime.    BLOOD GLUCOSE CALIBRATION (ACCU-CHEK AVIVA) SOLN    USE TO CALIBRATE METER AS DIRECTED   BLOOD GLUCOSE MONITORING SUPPL (ACCU-CHEK AVIVA PLUS) W/DEVICE KIT    Use to check blood sugar once daily for DM (dx. E11.9)   CALCIUM ELEMENTAL AS CARBONATE (BARIATRIC TUMS ULTRA) 400 MG CHEWABLE TABLET    Chew 1,000 mg by mouth daily as needed for heartburn.   CAMPHOR-MENTHOL-METHYL SAL (SALONPAS) 3.07-19-08 % PTCH    Apply topically.   CARVEDILOL  (COREG ) 25 MG TABLET    Take 1 tablet (25 mg total) by mouth 2 (two) times daily.   CHOLECALCIFEROL (VITAMIN D ) 2000 UNITS TABLET    Take 2,000 Units by mouth daily.   CYANOCOBALAMIN  (B-12) 2500 MCG SUBL    Place 1 tablet under the tongue daily.   DOCUSATE SODIUM  (STOOL SOFTENER) 100 MG CAPSULE    Take 100 mg by mouth in the morning and at bedtime.   EZETIMIBE  (ZETIA ) 10 MG TABLET    Take 1 tablet (10 mg total) by mouth daily.   FENOFIBRATE  (TRICOR ) 145 MG TABLET    TAKE 1 TABLET EVERY DAY   FERROUS SULFATE  (IRON  PO)    Take 65 mg by mouth daily.   GABAPENTIN  (NEURONTIN ) 300 MG CAPSULE    TAKE 1 CAPSULE THREE TIMES DAILY AS NEEDED   GLIPIZIDE  (GLUCOTROL  XL) 5 MG 24 HR TABLET    Take 1 tablet (5 mg total) by mouth daily with breakfast.   LORATADINE (CLARITIN) 10 MG TABLET    Take 10 mg by mouth daily as needed for allergies.   LOSARTAN  (COZAAR ) 100 MG TABLET    Take 1 tablet (100 mg total) by mouth daily.   MECLIZINE   (ANTIVERT ) 25 MG TABLET    Take 1 tablet (25 mg total) by mouth 3 (three) times daily as needed for dizziness.   MELOXICAM  (MOBIC ) 15 MG TABLET    TAKE 1 TABLET(15 MG) BY MOUTH DAILY   METFORMIN  (GLUCOPHAGE ) 1000 MG TABLET    TAKE 1/2 TABLET TWICE DAILY WITH MEALS   OMEPRAZOLE  (PRILOSEC) 40 MG CAPSULE    TAKE 1 CAPSULE IN THE MORNING AND AT BEDTIME.   ONDANSETRON  (ZOFRAN ) 4 MG TABLET    TAKE 1 TABLET EVERY 4 HOURS AS NEEDED FOR NAUSEA AND VOMITING   SALINE NASAL SPRAY NA    Place 1 spray into the nose daily as needed (congestion).   Modified Medications   No medications on file  Discontinued Medications   No medications on file    Past Medical History Past Medical History:  Diagnosis Date   Allergic rhinitis    Allergy    Asthma    Breast cancer [facility]) 1996   right breast   Cataract Oct. 2021   Depression 1981   DM2 (diabetes mellitus, type 2) (HCC)    GERD (gastroesophageal reflux disease)    Hx of cardiovascular stress test    a. Lex MV 2/14:  EF 69%, no ischemia   Hyperlipidemia    Hypertension    IDA (iron  deficiency anemia) 08/27/2020   Iron  deficiency anemia    Obesity    Osteoarthritis    hands   Thyroid  nodule 06/29/2023   Ulcer     Past Surgical History Past Surgical History:  Procedure Laterality Date   APPENDECTOMY  1976   BREAST LUMPECTOMY Right 1996   right breast, lumpectomy w/nodes   CATARACT EXTRACTION W/PHACO Left 11/28/2020   Procedure: CATARACT EXTRACTION PHACO AND INTRAOCULAR LENS PLACEMENT (IOC) LEFT DIABETIC;  Surgeon: Mittie Gaskin, MD;  Location: [facility];  Service: Ophthalmology;  Laterality: Left;  5.02 01:07.4 7.4%   CATARACT EXTRACTION W/PHACO Right 12/12/2020   Procedure: CATARACT EXTRACTION PHACO AND INTRAOCULAR LENS PLACEMENT (IOC) RIGHT DIABETIC 5.22 00:59.2;  Surgeon: Mittie Gaskin, MD;  Location: [facility];  Service: Ophthalmology;  Laterality: Right;   CHONDROPLASTY Left 02/10/2018   Procedure:  CHONDROPLASTY;  Surgeon: Leora Lynwood SAUNDERS, MD;  Location: [facility];  Service: Orthopedics;  Laterality: Left;   EYE SURGERY     FOOT SURGERY Left 07/2022   bone removal   GASTRIC RESTRICTION SURGERY     for reflux   HYSTEROSCOPY WITH D & C N/A 10/18/2012   Procedure: DILATATION AND CURETTAGE /HYSTEROSCOPY;  Surgeon: Winton Felt, MD;  Location: [facility];  Service: Gynecology;  Laterality: N/A;   KNEE ARTHROSCOPY WITH MEDIAL MENISECTOMY Left 02/10/2018   Procedure: KNEE ARTHROSCOPY WITH MEDIAL and LATERAL MENISECTOMY;  Surgeon: Leora Lynwood SAUNDERS, MD;  Location: [facility];  Service: Orthopedics;  Laterality: Left;   POLYPECTOMY N/A 10/18/2012   Procedure: POLYPECTOMY;  Surgeon: Winton Felt, MD;  Location: [facility];  Service: Gynecology;  Laterality: N/A;   SYNOVECTOMY Left 02/10/2018   Procedure: SYNOVECTOMY;  Surgeon: Leora Lynwood SAUNDERS, MD;  Location: [facility];  Service: Orthopedics;  Laterality: Left;    Family History family history includes CAD (age of onset: 45) in her father; Cancer in her brother and mother; Diabetes in her brother, mother, and sister; Esophageal cancer in her brother; Heart disease in her father, mother, and sister; Hypertension in her mother; Kidney cancer in her mother; Stroke in her father.  Social History Social History   Socioeconomic History   Marital status: Single    Spouse name: Not on file   Number of children: 0   Years of education: Not on file   Highest education level: Some college, no degree  Occupational History   Occupation: retired    Associate Professor: RETIRED  Tobacco Use   Smoking status: Never   Smokeless tobacco: Never   Tobacco comments:    Never smoked  Vaping Use   Vaping status: Never Used  Substance and Sexual Activity   Alcohol use: Never   Drug use: Never   Sexual activity: Not Currently    Birth control/protection: Post-menopausal  Other Topics Concern   Not on file  Social History Narrative   Lives with, and cares for her elderly  mother.  Social Drivers of Corporate Investment Banker Strain: Low Risk  (06/27/2023)   Overall Financial Resource Strain (CARDIA)    Difficulty of Paying Living Expenses: Not very hard  Food Insecurity: No Food Insecurity (06/27/2023)   Hunger Vital Sign    Worried About Running Out of Food in the Last Year: Never true    Ran Out of Food in the Last Year: Never true  Transportation Needs: No Transportation Needs (06/27/2023)   PRAPARE - Administrator, Civil Service (Medical): No    Lack of Transportation (Non-Medical): No  Physical Activity: Inactive (06/27/2023)   Exercise Vital Sign    Days of Exercise per Week: 0 days    Minutes of Exercise per Session: 60 min  Stress: No Stress Concern Present (06/27/2023)   [employer] of Occupational Health - Occupational Stress Questionnaire    Feeling of Stress : Not at all  Social Connections: Moderately Integrated (06/27/2023)   Social Connection and Isolation Panel [NHANES]    Frequency of Communication with Friends and Family: More than three times a week    Frequency of Social Gatherings with Friends and Family: More than three times a week    Attends Religious Services: More than 4 times per year    Active Member of Clubs or Organizations: Yes    Attends Banker Meetings: More than 4 times per year    Marital Status: Never married  Intimate Partner Violence: Not At Risk (01/22/2023)   Humiliation, Afraid, Rape, and Kick questionnaire    Fear of Current or Ex-Partner: No    Emotionally Abused: No    Physically Abused: No    Sexually Abused: No    Laboratory Investigations Lab Results  Component Value Date   TSH 0.86 08/14/2023   TSH 0.69 01/19/2023   TSH 1.17 09/09/2021   FREET4 1.4 08/14/2023     No results found for: TSI   No components found for: TRAB   Lab Results  Component Value Date   CHOL 148 03/06/2023   Lab Results  Component Value Date   HDL 39 (L) 03/06/2023   Lab  Results  Component Value Date   LDLCALC 47 03/06/2023   Lab Results  Component Value Date   TRIG 308 (H) 03/06/2023   Lab Results  Component Value Date   CHOLHDL 3.8 03/06/2023   Lab Results  Component Value Date   CREATININE 1.06 (H) 03/06/2023   Lab Results  Component Value Date   GFR 52.09 (L) 01/19/2023      Component Value Date/Time   NA 137 03/06/2023 1135   K 4.0 03/06/2023 1135   CL 107 03/06/2023 1135   CO2 21 (L) 03/06/2023 1135   GLUCOSE 102 (H) 03/06/2023 1135   BUN 27 (H) 03/06/2023 1135   CREATININE 1.06 (H) 03/06/2023 1135   CALCIUM 9.3 03/06/2023 1135   PROT 7.1 03/06/2023 1135   ALBUMIN 3.5 03/06/2023 1135   AST 19 03/06/2023 1135   ALT 13 03/06/2023 1135   ALKPHOS 36 (L) 03/06/2023 1135   BILITOT 0.3 03/06/2023 1135   GFRNONAA 56 (L) 03/06/2023 1135   GFRAA >60 02/02/2018 1056      Latest Ref Rng & Units 03/06/2023   11:35 AM 01/19/2023    7:53 AM 09/08/2022    2:04 PM  BMP  Glucose 70 - 99 mg/dL 897  823  892   BUN 8 - 23 mg/dL 27  23  21    Creatinine 0.44 -  1.00 mg/dL 8.93  8.92  8.81   Sodium 135 - 145 mmol/L 137  138  139   Potassium 3.5 - 5.1 mmol/L 4.0  4.4  4.4   Chloride 98 - 111 mmol/L 107  102  100   CO2 22 - 32 mmol/L 21  26  24    Calcium 8.9 - 10.3 mg/dL 9.3  9.0  9.1        Component Value Date/Time   WBC 7.2 06/15/2023 1114   WBC 6.6 01/19/2023 0753   RBC 4.53 06/15/2023 1114   HGB 12.8 06/15/2023 1114   HGB 12.6 10/07/2006 0747   HCT 39.2 06/15/2023 1114   HCT 35.5 10/07/2006 0747   PLT 334 06/15/2023 1114   PLT 353 10/07/2006 0747   MCV 86.5 06/15/2023 1114   MCV 86.2 10/07/2006 0747   MCH 28.3 06/15/2023 1114   MCHC 32.7 06/15/2023 1114   RDW 15.6 (H) 06/15/2023 1114   RDW 12.7 10/07/2006 0747   LYMPHSABS 1.6 06/15/2023 1114   LYMPHSABS 2.3 10/07/2006 0747   MONOABS 0.4 06/15/2023 1114   MONOABS 0.5 10/07/2006 0747   EOSABS 0.3 06/15/2023 1114   EOSABS 0.2 10/07/2006 0747   BASOSABS 0.1 06/15/2023 1114    BASOSABS 0.0 10/07/2006 0747      Parts of this note may have been dictated using voice recognition software. There may be variances in spelling and vocabulary which are unintentional. Not all errors are proofread. Please notify the dino if any discrepancies are noted or if the meaning of any statement is not clear.

## 2023-08-21 DIAGNOSIS — M1712 Unilateral primary osteoarthritis, left knee: Secondary | ICD-10-CM | POA: Diagnosis not present

## 2023-08-21 DIAGNOSIS — M1732 Unilateral post-traumatic osteoarthritis, left knee: Secondary | ICD-10-CM | POA: Diagnosis not present

## 2023-08-27 DIAGNOSIS — M5412 Radiculopathy, cervical region: Secondary | ICD-10-CM | POA: Diagnosis not present

## 2023-08-27 DIAGNOSIS — M4802 Spinal stenosis, cervical region: Secondary | ICD-10-CM | POA: Diagnosis not present

## 2023-08-27 NOTE — Progress Notes (Signed)
Patient for US guided FNA LT Isthmus thyroid nodule biopsy on Friday 08/28/23, I called and spoke with the patient on the phone and gave pre-procedure instructions. Pt was made aware to be here at 12:30p and check in at the [facility] registration desk. Pt stated understanding.  Called 08/20/23

## 2023-08-28 ENCOUNTER — Ambulatory Visit
Admission: RE | Admit: 2023-08-28 | Discharge: 2023-08-28 | Disposition: A | Discharge status: Home / Self Care | Payer: Medicare PPO | Source: Ambulatory Visit | Attending: "Endocrinology | Admitting: "Endocrinology

## 2023-08-28 VITALS — BP 157/33 | HR 78 | Resp 18

## 2023-08-28 DIAGNOSIS — E042 Nontoxic multinodular goiter: Secondary | ICD-10-CM | POA: Insufficient documentation

## 2023-08-28 DIAGNOSIS — E041 Nontoxic single thyroid nodule: Secondary | ICD-10-CM | POA: Diagnosis not present

## 2023-08-28 MED ORDER — LIDOCAINE HCL (PF) 1 % IJ SOLN
10.0000 mL | Freq: Once | INTRAMUSCULAR | Status: AC
  Administered 2023-08-28: 10 mL via INTRADERMAL
  Filled 2023-08-28: qty 10

## 2023-08-31 ENCOUNTER — Other Ambulatory Visit: Payer: Self-pay

## 2023-08-31 LAB — CYTOLOGY - NON PAP

## 2023-08-31 MED ORDER — CARVEDILOL 25 MG PO TABS: 25.0000 mg | ORAL_TABLET | Freq: Two times a day (BID) | ORAL | 0 refills | Status: DC

## 2023-08-31 MED ORDER — AMLODIPINE BESYLATE 5 MG PO TABS: 5.0000 mg | ORAL_TABLET | Freq: Every day | ORAL | 0 refills | Status: DC

## 2023-08-31 MED ORDER — LOSARTAN POTASSIUM 100 MG PO TABS: 100.0000 mg | ORAL_TABLET | Freq: Every day | ORAL | 0 refills | Status: DC

## 2023-08-31 NOTE — Addendum Note (Signed)
Addended by: Guerry Minors on: 08/31/2023 03:21 PM   Modules accepted: Orders

## 2023-08-31 NOTE — Telephone Encounter (Addendum)
Last office visit: 06/18/23 with plan to f/u in 3 months Next office visit: 09/16/23  Requested Prescriptions   Signed Prescriptions Disp Refills   losartan (COZAAR) 100 MG tablet 90 tablet 0    Sig: Take 1 tablet (100 mg total) by mouth daily.    Authorizing Provider: Debbe Odea    Ordering User: Feliberto Harts L   carvedilol (COREG) 25 MG tablet 180 tablet 0    Sig: Take 1 tablet (25 mg total) by mouth 2 (two) times daily.    Authorizing Provider: Debbe Odea    Ordering User: Feliberto Harts L   amLODipine (NORVASC) 5 MG tablet 90 tablet 0    Sig: Take 1 tablet (5 mg total) by mouth daily.    Authorizing Provider: Debbe Odea    Ordering User: Guerry Minors

## 2023-08-31 NOTE — Addendum Note (Signed)
Addended by: Guerry Minors on: 08/31/2023 03:23 PM   Modules accepted: Orders

## 2023-09-05 ENCOUNTER — Other Ambulatory Visit: Payer: Self-pay | Admitting: Family Medicine

## 2023-09-16 ENCOUNTER — Ambulatory Visit: Payer: Medicare PPO | Attending: Cardiology | Admitting: Cardiology

## 2023-09-16 ENCOUNTER — Encounter: Payer: Self-pay | Admitting: Cardiology

## 2023-09-16 VITALS — BP 138/80 | HR 87 | Ht 59.0 in | Wt 179.0 lb

## 2023-09-16 DIAGNOSIS — E781 Pure hyperglyceridemia: Secondary | ICD-10-CM | POA: Diagnosis not present

## 2023-09-16 DIAGNOSIS — I10 Essential (primary) hypertension: Secondary | ICD-10-CM

## 2023-09-16 MED ORDER — NEXLETOL 180 MG PO TABS
1.0000 | ORAL_TABLET | Freq: Every day | ORAL | 2 refills | Status: DC
Start: 2023-09-16 — End: 2024-03-30

## 2023-09-16 MED ORDER — EZETIMIBE 10 MG PO TABS: 10.0000 mg | ORAL_TABLET | Freq: Every day | ORAL | 2 refills | Status: DC

## 2023-09-16 NOTE — Patient Instructions (Signed)
 Medication Instructions:  Your Physician recommend you continue on your current medication as directed.    *If you need a refill on your cardiac medications before your next appointment, please call your pharmacy*   Lab Work: None ordered at this time    Follow-Up: At [facility], you and your health needs are our priority.  As part of our continuing mission to provide you with exceptional heart care, we have created designated Provider Care Teams.  These Care Teams include your primary Cardiologist (physician) and Advanced Practice Providers (APPs -  Physician Assistants and Nurse Practitioners) who all work together to provide you with the care you need, when you need it.   Your next appointment:   1 year(s)  Provider:   You may see Debbe Odea, MD or one of the following Advanced Practice Providers on your designated Care Team:   Nicolasa Ducking, NP Eula Listen, PA-C Cadence Fransico Michael, PA-C Charlsie Quest, NP Carlos Levering, NP

## 2023-09-16 NOTE — Progress Notes (Signed)
 Cardiology Office Note:    Date:  09/16/2023   ID:  Martha Hamilton, Medico [birth year], MRN [id number]  PCP:  [name], MD   [facility] Providers Cardiologist:  Debbe Odea, MD     Referring MD: [name], MD   Chief Complaint  Patient presents with   Follow-up    3 month follow up visit. Patient is doing well on today. Meds reviewed.     History of Present Illness:    Martha Hamilton is a 73 y.o. female with a hx of hypertension, hyperlipidemia, diabetes, CKD 3 presenting for follow-up.   Previously seen for elevated BP, started on carvedilol with good effect.  States systolic blood pressures at home range in the 120s.  Tolerating Coreg with no adverse effects.  Feels well, has no concerns at this time.  Prior notes Echocardiogram 02/11/2022 EF 60 to 65% Cardiac monitor 02/05/2022 normal, no arrhythmias to suggest etiology of syncope.   Past Medical History:  Diagnosis Date   Allergic rhinitis    Allergy    Asthma    Breast cancer [facility]) 1996   right breast   Cataract Oct. 2021   Depression 1981   DM2 (diabetes mellitus, type 2) (HCC)    GERD (gastroesophageal reflux disease)    Hx of cardiovascular stress test    a. Lex MV 2/14:  EF 69%, no ischemia   Hyperlipidemia    Hypertension    IDA (iron deficiency anemia) 08/27/2020   Iron deficiency anemia    Obesity    Osteoarthritis    hands   Thyroid nodule 06/29/2023   Ulcer     Past Surgical History:  Procedure Laterality Date   APPENDECTOMY  1976   BREAST LUMPECTOMY Right 1996   right breast, lumpectomy w/nodes   CATARACT EXTRACTION W/PHACO Left 11/28/2020   Procedure: CATARACT EXTRACTION PHACO AND INTRAOCULAR LENS PLACEMENT (IOC) LEFT DIABETIC;  Surgeon: Lockie Mola, MD;  Location: [facility];  Service: Ophthalmology;  Laterality: Left;  5.02 01:07.4 7.4%   CATARACT EXTRACTION W/PHACO Right 12/12/2020   Procedure: CATARACT EXTRACTION PHACO AND INTRAOCULAR LENS  PLACEMENT (IOC) RIGHT DIABETIC 5.22 00:59.2;  Surgeon: Lockie Mola, MD;  Location: [facility];  Service: Ophthalmology;  Laterality: Right;   CHONDROPLASTY Left 02/10/2018   Procedure: CHONDROPLASTY;  Surgeon: Lyndle Herrlich, MD;  Location: [facility];  Service: Orthopedics;  Laterality: Left;   EYE SURGERY     FOOT SURGERY Left 07/2022   bone removal   GASTRIC RESTRICTION SURGERY     for reflux   HYSTEROSCOPY WITH D & C N/A 10/18/2012   Procedure: DILATATION AND CURETTAGE /HYSTEROSCOPY;  Surgeon: Catalina Antigua, MD;  Location: [facility];  Service: Gynecology;  Laterality: N/A;   KNEE ARTHROSCOPY WITH MEDIAL MENISECTOMY Left 02/10/2018   Procedure: KNEE ARTHROSCOPY WITH MEDIAL and LATERAL MENISECTOMY;  Surgeon: Lyndle Herrlich, MD;  Location: [facility];  Service: Orthopedics;  Laterality: Left;   POLYPECTOMY N/A 10/18/2012   Procedure: POLYPECTOMY;  Surgeon: Catalina Antigua, MD;  Location: [facility];  Service: Gynecology;  Laterality: N/A;   SYNOVECTOMY Left 02/10/2018   Procedure: SYNOVECTOMY;  Surgeon: Lyndle Herrlich, MD;  Location: [facility];  Service: Orthopedics;  Laterality: Left;    Current Medications: Current Meds  Medication Sig   ACCU-CHEK AVIVA PLUS test strip TEST BLOOD SUGAR EVERY DAY FOR DIABETES   Accu-Chek Softclix Lancets lancets TEST BLOOD SUGAR EVERY DAY   acetaminophen (TYLENOL) 500 MG tablet Take 1,000 mg  by mouth every 6 (six) hours as needed for mild pain.    Alcohol Swabs (DROPSAFE ALCOHOL PREP) 70 % PADS USE ONE TIME DAILY AS DIRECTED WHEN CHECKING BLOOD SUGAR   amitriptyline (ELAVIL) 25 MG tablet TAKE 1 TABLET AT BEDTIME   amLODipine (NORVASC) 5 MG tablet Take 1 tablet (5 mg total) by mouth daily.   aspirin 81 MG tablet Take 81 mg by mouth at bedtime.    Blood Glucose Calibration (ACCU-CHEK AVIVA) SOLN USE TO CALIBRATE METER AS DIRECTED   Blood Glucose Monitoring Suppl (ACCU-CHEK AVIVA PLUS) w/Device KIT Use to check blood sugar once daily for DM (dx.  E11.9)   calcium elemental as carbonate (BARIATRIC TUMS ULTRA) 400 MG chewable tablet Chew 1,000 mg by mouth daily as needed for heartburn.   Camphor-Menthol-Methyl Sal (SALONPAS) 3.07-19-08 % PTCH Apply topically.   carvedilol (COREG) 25 MG tablet Take 1 tablet (25 mg total) by mouth 2 (two) times daily.   Cholecalciferol (VITAMIN D) 2000 units tablet Take 2,000 Units by mouth daily.   Cyanocobalamin (B-12) 2500 MCG SUBL Place 1 tablet under the tongue daily.   Docusate Sodium (STOOL SOFTENER) 100 MG capsule Take 100 mg by mouth in the morning and at bedtime.   fenofibrate (TRICOR) 145 MG tablet TAKE 1 TABLET EVERY DAY   Ferrous Sulfate (IRON PO) Take 65 mg by mouth daily.   gabapentin (NEURONTIN) 300 MG capsule TAKE 1 CAPSULE THREE TIMES DAILY AS NEEDED   glipiZIDE (GLUCOTROL XL) 5 MG 24 hr tablet Take 1 tablet (5 mg total) by mouth daily with breakfast.   loratadine (CLARITIN) 10 MG tablet Take 10 mg by mouth daily as needed for allergies.   losartan (COZAAR) 100 MG tablet Take 1 tablet (100 mg total) by mouth daily.   meclizine (ANTIVERT) 25 MG tablet Take 1 tablet (25 mg total) by mouth 3 (three) times daily as needed for dizziness.   meloxicam (MOBIC) 15 MG tablet TAKE 1 TABLET(15 MG) BY MOUTH DAILY   metFORMIN (GLUCOPHAGE) 1000 MG tablet TAKE 1/2 TABLET TWICE DAILY WITH MEALS   omeprazole (PRILOSEC) 40 MG capsule TAKE 1 CAPSULE IN THE MORNING AND AT BEDTIME.   ondansetron (ZOFRAN) 4 MG tablet TAKE 1 TABLET EVERY 4 HOURS AS NEEDED FOR NAUSEA AND VOMITING   SALINE NASAL SPRAY NA Place 1 spray into the nose daily as needed (congestion).    [DISCONTINUED] ezetimibe (ZETIA) 10 MG tablet Take 1 tablet (10 mg total) by mouth daily.   [DISCONTINUED] NEXLETOL 180 MG TABS Take 1 tablet by mouth daily.     Allergies:   Cholestyramine, Simvastatin, Lipitor [atorvastatin], and Niacin   Social History   Socioeconomic History   Marital status: Single    Spouse name: Not on file   Number of  children: 0   Years of education: Not on file   Highest education level: Some college, no degree  Occupational History   Occupation: retired    Associate Professor: RETIRED  Tobacco Use   Smoking status: Never   Smokeless tobacco: Never   Tobacco comments:    Never smoked  Vaping Use   Vaping status: Never Used  Substance and Sexual Activity   Alcohol use: Never   Drug use: Never   Sexual activity: Not Currently    Birth control/protection: Post-menopausal  Other Topics Concern   Not on file  Social History Narrative   Lives with, and cares for her elderly mother.   Social Drivers of Corporate investment banker Strain: Low  Risk  (06/27/2023)   Overall Financial Resource Strain (CARDIA)    Difficulty of Paying Living Expenses: Not very hard  Food Insecurity: No Food Insecurity (06/27/2023)   Hunger Vital Sign    Worried About Running Out of Food in the Last Year: Never true    Ran Out of Food in the Last Year: Never true  Transportation Needs: No Transportation Needs (06/27/2023)   PRAPARE - Administrator, Civil Service (Medical): No    Lack of Transportation (Non-Medical): No  Physical Activity: Inactive (06/27/2023)   Exercise Vital Sign    Days of Exercise per Week: 0 days    Minutes of Exercise per Session: 60 min  Stress: No Stress Concern Present (06/27/2023)   [employer] of Occupational Health - Occupational Stress Questionnaire    Feeling of Stress : Not at all  Social Connections: Moderately Integrated (06/27/2023)   Social Connection and Isolation Panel [NHANES]    Frequency of Communication with Friends and Family: More than three times a week    Frequency of Social Gatherings with Friends and Family: More than three times a week    Attends Religious Services: More than 4 times per year    Active Member of [employer] or Organizations: Yes    Attends Engineer, structural: More than 4 times per year    Marital Status: Never married     Family  History: The patient's family history includes CAD (age of onset: 82) in her father; Cancer in her brother and mother; Diabetes in her brother, mother, and sister; Esophageal cancer in her brother; Heart disease in her father, mother, and sister; Hypertension in her mother; Kidney cancer in her mother; Stroke in her father. There is no history of Colon cancer, Rectal cancer, Stomach cancer, or Pancreatic cancer.  ROS:   Please see the history of present illness.     All other systems reviewed and are negative.  EKGs/Labs/Other Studies Reviewed:    The following studies were reviewed today:        Recent Labs: 03/06/2023: ALT 13; BUN 27; Creatinine 1.06; Potassium 4.0; Sodium 137 06/15/2023: Hemoglobin 12.8; Platelet Count 334 08/14/2023: TSH 0.86  Recent Lipid Panel    Component Value Date/Time   CHOL 148 03/06/2023 1135   TRIG 308 (H) 03/06/2023 1135   HDL 39 (L) 03/06/2023 1135   CHOLHDL 3.8 03/06/2023 1135   VLDL 62 (H) 03/06/2023 1135   LDLCALC 47 03/06/2023 1135   LDLDIRECT 80.0 01/19/2023 0753     Risk Assessment/Calculations:         Physical Exam:    VS:  BP 138/80   Pulse 87   Ht 4\' 11"  (1.499 m)   Wt 179 lb (81.2 kg)   LMP 06/13/2001   SpO2 96%   BMI 36.15 kg/m     Wt Readings from Last 3 Encounters:  09/16/23 179 lb (81.2 kg)  08/19/23 178 lb 6.4 oz (80.9 kg)  06/29/23 175 lb 4 oz (79.5 kg)     GEN:  Well nourished, well developed in no acute distress HEENT: Normal NECK: No JVD; No carotid bruits CARDIAC: RRR, no murmurs, rubs, gallops RESPIRATORY:  Clear to auscultation without rales, wheezing or rhonchi  ABDOMEN: Soft, non-tender, non-distended MUSCULOSKELETAL:  No edema; No deformity  SKIN: Warm and dry NEUROLOGIC:  Alert and oriented x 3 PSYCHIATRIC:  Normal affect   ASSESSMENT:    1. Primary hypertension   2. High triglycerides    PLAN:  In order of problems listed above:  Hypertension, BP controlled.  Continue Coreg 25 mg twice  daily, losartan 100 mg daily, Norvasc to 5 mg. Elevated triglycerides, statin intolerant.  Continue Zetia, bempedoic acid, fenofibrate.  Low-cholesterol diet emphasized.  Follow-up in 12 months or as needed.     Medication Adjustments/Labs and Tests Ordered: Current medicines are reviewed at length with the patient today.  Concerns regarding medicines are outlined above.  No orders of the defined types were placed in this encounter.  Meds ordered this encounter  Medications   ezetimibe (ZETIA) 10 MG tablet    Sig: Take 1 tablet (10 mg total) by mouth daily.    Dispense:  90 tablet    Refill:  2   NEXLETOL 180 MG TABS    Sig: Take 1 tablet (180 mg total) by mouth daily.    Dispense:  90 tablet    Refill:  2    Patient Instructions  Medication Instructions:  Your Physician recommend you continue on your current medication as directed.    *If you need a refill on your cardiac medications before your next appointment, please call your pharmacy*   Lab Work: None ordered at this time   Follow-Up: At [facility], you and your health needs are our priority.  As part of our continuing mission to provide you with exceptional heart care, we have created designated Provider Care Teams.  These Care Teams include your primary Cardiologist (physician) and Advanced Practice Providers (APPs -  Physician Assistants and Nurse Practitioners) who all work together to provide you with the care you need, when you need it.   Your next appointment:   1 year(s)  Provider:   You may see Debbe Odea, MD or one of the following Advanced Practice Providers on your designated Care Team:   Nicolasa Ducking, NP Eula Listen, PA-C Cadence Fransico Michael, PA-C Charlsie Quest, NP Carlos Levering, NP   Signed, Debbe Odea, MD  09/16/2023 10:53 AM    [facility]

## 2023-10-11 ENCOUNTER — Other Ambulatory Visit: Payer: Self-pay | Admitting: Family Medicine

## 2023-10-13 ENCOUNTER — Telehealth: Payer: Self-pay | Admitting: *Deleted

## 2023-10-13 NOTE — Telephone Encounter (Signed)
 Per Dr. Milinda Antis:  Due for a diabetic visit this month  Please schedule  Needs A1c and repeat microalbumin  Thanks       Please schedule DM f/u will do A1c and urine test at appt

## 2023-10-13 NOTE — Telephone Encounter (Signed)
 Due for a diabetic visit this month  Please schedule  Needs A1c and repeat microalbumin  Thanks

## 2023-10-13 NOTE — Telephone Encounter (Signed)
 Spoke to pt, call got disconnected, tried calling back, line was busy.

## 2023-10-13 NOTE — Telephone Encounter (Signed)
 Sent message to schedulers to get appt scheduled

## 2023-10-14 NOTE — Telephone Encounter (Signed)
 Patient scheduled.

## 2023-10-20 ENCOUNTER — Inpatient Hospital Stay: Payer: Medicare PPO | Attending: Internal Medicine

## 2023-10-20 DIAGNOSIS — E538 Deficiency of other specified B group vitamins: Secondary | ICD-10-CM | POA: Insufficient documentation

## 2023-10-20 DIAGNOSIS — D508 Other iron deficiency anemias: Secondary | ICD-10-CM

## 2023-10-20 MED ORDER — CYANOCOBALAMIN 1000 MCG/ML IJ SOLN
1000.0000 ug | Freq: Once | INTRAMUSCULAR | Status: AC
  Administered 2023-10-20: 1000 ug via INTRAMUSCULAR
  Filled 2023-10-20: qty 1

## 2023-10-21 ENCOUNTER — Encounter: Payer: Self-pay | Admitting: Family Medicine

## 2023-10-21 ENCOUNTER — Ambulatory Visit: Admitting: Family Medicine

## 2023-10-21 VITALS — BP 126/72 | HR 77 | Temp 97.6°F | Ht 59.0 in | Wt 177.5 lb

## 2023-10-21 DIAGNOSIS — N1832 Chronic kidney disease, stage 3b: Secondary | ICD-10-CM

## 2023-10-21 DIAGNOSIS — E1169 Type 2 diabetes mellitus with other specified complication: Secondary | ICD-10-CM

## 2023-10-21 DIAGNOSIS — D473 Essential (hemorrhagic) thrombocythemia: Secondary | ICD-10-CM | POA: Diagnosis not present

## 2023-10-21 DIAGNOSIS — E119 Type 2 diabetes mellitus without complications: Secondary | ICD-10-CM

## 2023-10-21 DIAGNOSIS — T466X5A Adverse effect of antihyperlipidemic and antiarteriosclerotic drugs, initial encounter: Secondary | ICD-10-CM | POA: Diagnosis not present

## 2023-10-21 DIAGNOSIS — E785 Hyperlipidemia, unspecified: Secondary | ICD-10-CM

## 2023-10-21 DIAGNOSIS — Z7984 Long term (current) use of oral hypoglycemic drugs: Secondary | ICD-10-CM

## 2023-10-21 DIAGNOSIS — G720 Drug-induced myopathy: Secondary | ICD-10-CM

## 2023-10-21 DIAGNOSIS — I10 Essential (primary) hypertension: Secondary | ICD-10-CM | POA: Diagnosis not present

## 2023-10-21 LAB — MICROALBUMIN / CREATININE URINE RATIO
Creatinine,U: 156.8 mg/dL
Microalb Creat Ratio: 12.4 mg/g (ref 0.0–30.0)
Microalb, Ur: 2 mg/dL — ABNORMAL HIGH (ref 0.0–1.9)

## 2023-10-21 LAB — BASIC METABOLIC PANEL WITH GFR
BUN: 23 mg/dL (ref 6–23)
CO2: 27 meq/L (ref 19–32)
Calcium: 8.7 mg/dL (ref 8.4–10.5)
Chloride: 100 meq/L (ref 96–112)
Creatinine, Ser: 0.96 mg/dL (ref 0.40–1.20)
GFR: 59.01 mL/min — ABNORMAL LOW (ref >60.00)
Glucose, Bld: 115 mg/dL — ABNORMAL HIGH (ref 70–99)
Potassium: 4.6 meq/L (ref 3.5–5.1)
Sodium: 138 meq/L (ref 135–145)

## 2023-10-21 LAB — POCT GLYCOSYLATED HEMOGLOBIN (HGB A1C): Hemoglobin A1C: 6.4 % — AB (ref 4.0–5.6)

## 2023-10-21 MED ORDER — AMITRIPTYLINE HCL 25 MG PO TABS: 25.0000 mg | ORAL_TABLET | Freq: Every day | ORAL | 2 refills | Status: AC

## 2023-10-21 MED ORDER — SEMAGLUTIDE(0.25 OR 0.5MG/DOS) 2 MG/3ML ~~LOC~~ SOPN
0.2500 mg | PEN_INJECTOR | SUBCUTANEOUS | 0 refills | Status: DC
Start: 2023-10-21 — End: 2023-12-17

## 2023-10-21 NOTE — Assessment & Plan Note (Signed)
 Lab today  Last GFR 56  Drinking fluids  Taking arb

## 2023-10-21 NOTE — Patient Instructions (Addendum)
 Try to get most of your carbohydrates from produce (with the exception of white potatoes) and whole grains Eat less bread/pasta/rice/snack foods/cereals/sweets and other items from the middle of the grocery store (processed carbs)  Use sweet potatoes instead of regular potato   Get protein with every meal The following are examples of protein in diet  Meat  Fish  Eggs  Dairy products  Soy products  Oat milk  Almond milk Nuts and nut butters  Legumes  Dried beans     Read about the GLP-1 medicines  Ozempic and mounjaro  I will send a prescription for ozempic to the pharmacy and we can start the process of prior auth- see if covered or if other brand would be covered  If you can get it -we can schedule a nurse appointment to learn how to use it   If we start it - it would replace glipizide   Labs today  Urine protein test and kidney function   Follow up for annual in July as planned

## 2023-10-21 NOTE — Assessment & Plan Note (Addendum)
 Lab Results  Component Value Date   HGBA1C 6.4 (A) 10/21/2023   HGBA1C 6.0 (A) 05/06/2023   HGBA1C 7.6 (H) 01/19/2023   Metformin 500 mg bid Glipizide xl 5 mg daily   Discussed trial of glp-1 for weight and DM / may also help renal status  Handouts given on ozempic and mounjoro generics Sent ozempic 0.25 mg to pharmacy  Disc option of GLP medication including possible side effects like GI intolerance and risk of thyroid and endocrine cancer, pancreatitis and gallstones, kidney problems and diabetic retinopathy  Will see if covered and which brand  Then schedule nurse visit to teach self admin   Next appointment is in July for annual / may follow up earlier if needed   Microalb today (may improve with better blood pressure) Also bmet   Encouraged low glycemic diet  Keep walking  Add more resistance exercise if able

## 2023-10-21 NOTE — Progress Notes (Signed)
 Subjective:    Patient ID: Martha Hamilton, female    DOB: [birth year], 73 y.o.   MRN: [id number]  HPI  Wt Readings from Last 3 Encounters:  10/21/23 177 lb 8 oz (80.5 kg)  09/16/23 179 lb (81.2 kg)  08/19/23 178 lb 6.4 oz (80.9 kg)   35.85 kg/m  Vitals:   10/21/23 0918  BP: 126/72  Pulse: 77  Temp: 97.6 F (36.4 C)  SpO2: 94%    Pt presents for follow up of DM2 and HTN and chronic health problems  Feeling good overall   Had cardiology visit  Blood pressure is better !  Can wait a year for next visit    HTN bp is stable today  No cp or palpitations or headaches or edema  No side effects to medicines  BP Readings from Last 3 Encounters:  10/21/23 126/72  09/16/23 138/80  08/28/23 (!) 157/33    Losartan 50 mg bid  Metoprolol xl 100 mg daily  Amlodipine 2.5 mg daily -was increase to 5 mg by cardiology  Pulse Readings from Last 3 Encounters:  10/21/23 77  09/16/23 87  08/28/23 78      Lab Results  Component Value Date   NA 138 10/21/2023   K 4.6 10/21/2023   CO2 27 10/21/2023   GLUCOSE 115 (H) 10/21/2023   BUN 23 10/21/2023   CREATININE 0.96 10/21/2023   CALCIUM 8.7 10/21/2023   GFR 59.01 (L) 10/21/2023   GFRNONAA 56 (L) 03/06/2023  CKD Last GFR improved at 56  Lab Results  Component Value Date   WBC 7.2 06/15/2023   HGB 12.8 06/15/2023   HCT 39.2 06/15/2023   MCV 86.5 06/15/2023   PLT 334 06/15/2023       DM2 Lab Results  Component Value Date   HGBA1C 6.4 (A) 10/21/2023   HGBA1C 6.0 (A) 05/06/2023   HGBA1C 7.6 (H) 01/19/2023    Glipizide xl 5 mg daily  Metformin 500 mg bid   Lab Results  Component Value Date   MICROALBUR 2.0 (H) 10/21/2023   MICROALBUR 4.9 (H) 01/19/2023  Last ratio was 68 (with re calculation) in 01/2023   Will re do today    Really trying to eat better  Avoids fried food  Limiting potato   Walking for exercise outdoors now - with a friend  Dealing with back problem  At least 20-30 minutes of walking  per day  Has indoor option also  Uses some hand weights / 3 lb - does upper body work      No statin due to myopathy  On arb   Eye exam   Hyperlipidemia Lab Results  Component Value Date   CHOL 148 03/06/2023   HDL 39 (L) 03/06/2023   LDLCALC 47 03/06/2023   LDLDIRECT 80.0 01/19/2023   TRIG 308 (H) 03/06/2023   CHOLHDL 3.8 03/06/2023   Zetia 10 mg daily  Tricor 145 mg daily  Bempedoic acid 180 mg daily    Under cardiology care     Patient Active Problem List   Diagnosis Date Noted   Thyroid nodule 06/29/2023   Encounter for hepatitis C screening test for low risk patient 06/09/2023   Pedal edema 11/12/2022   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   Muscle cramps 02/28/2022   Neck pain 02/28/2022   Syncope 12/18/2021   Strain of flexor tendon of wrist 12/02/2021   Tendinitis of left wrist 12/02/2021   Left wrist pain 11/13/2021  Family history of cancer 08/27/2020   History of breast cancer 08/27/2020   IDA (iron deficiency anemia) 08/27/2020   B12 deficiency 08/27/2020   Chronic kidney disease (CKD) stage G3b/A1, moderately decreased glomerular filtration rate (GFR) between 30-44 mL/min/1.73 square meter and albuminuria creatinine ratio less than 30 mg/g (HCC) 07/24/2020   Tachycardia 01/11/2019   Fatty liver 06/21/2018   Obesity (BMI 30-39.9) 06/21/2018   Dyspepsia 05/25/2018   GERD (gastroesophageal reflux disease) 05/25/2018   H/O vertigo 05/11/2018   Essential (hemorrhagic) thrombocythemia (HCC) 12/25/2017   Tear of medial meniscus of knee 12/21/2017   Pain in left knee 12/21/2017   Left knee pain 09/21/2017   Welcome to Medicare preventive visit 12/17/2016   Grief reaction 12/17/2016   Estrogen deficiency 09/08/2016   Myalgia 02/08/2016   Normocytic anemia 02/09/2013   Abnormal EKG 07/27/2012   Special screening for malignant neoplasms, colon 04/29/2011   Encounter for gynecological examination 04/29/2011   Routine  general medical examination at a health care facility 04/20/2011   Osteoarthrosis, hand 10/31/2008   Diabetes type 2, controlled (HCC) 07/10/2008   Hyperlipidemia associated with type 2 diabetes mellitus (HCC) 01/26/2008   Essential hypertension 01/26/2008   Allergic rhinitis 12/07/2007   Past Medical History:  Diagnosis Date   Allergic rhinitis    Allergy    Asthma    Breast cancer (HCC) 1996   right breast   Cataract Oct. 2021   Depression 1981   DM2 (diabetes mellitus, type 2) (HCC)    GERD (gastroesophageal reflux disease)    Hx of cardiovascular stress test    a. Lex MV 2/14:  EF 69%, no ischemia   Hyperlipidemia    Hypertension    IDA (iron deficiency anemia) 08/27/2020   Iron deficiency anemia    Obesity    Osteoarthritis    hands   Thyroid nodule 06/29/2023   Ulcer    Past Surgical History:  Procedure Laterality Date   APPENDECTOMY  1976   BREAST LUMPECTOMY Right 1996   right breast, lumpectomy w/nodes   CATARACT EXTRACTION W/PHACO Left 11/28/2020   Procedure: CATARACT EXTRACTION PHACO AND INTRAOCULAR LENS PLACEMENT (IOC) LEFT DIABETIC;  Surgeon: Lockie Mola, MD;  Location: [facility];  Service: Ophthalmology;  Laterality: Left;  5.02 01:07.4 7.4%   CATARACT EXTRACTION W/PHACO Right 12/12/2020   Procedure: CATARACT EXTRACTION PHACO AND INTRAOCULAR LENS PLACEMENT (IOC) RIGHT DIABETIC 5.22 00:59.2;  Surgeon: Lockie Mola, MD;  Location: [facility];  Service: Ophthalmology;  Laterality: Right;   CHONDROPLASTY Left 02/10/2018   Procedure: CHONDROPLASTY;  Surgeon: Lyndle Herrlich, MD;  Location: [facility];  Service: Orthopedics;  Laterality: Left;   EYE SURGERY     FOOT SURGERY Left 07/2022   bone removal   GASTRIC RESTRICTION SURGERY     for reflux   HYSTEROSCOPY WITH D & C N/A 10/18/2012   Procedure: DILATATION AND CURETTAGE /HYSTEROSCOPY;  Surgeon: Catalina Antigua, MD;  Location: [facility];  Service: Gynecology;  Laterality: N/A;    KNEE ARTHROSCOPY WITH MEDIAL MENISECTOMY Left 02/10/2018   Procedure: KNEE ARTHROSCOPY WITH MEDIAL and LATERAL MENISECTOMY;  Surgeon: Lyndle Herrlich, MD;  Location: [facility];  Service: Orthopedics;  Laterality: Left;   POLYPECTOMY N/A 10/18/2012   Procedure: POLYPECTOMY;  Surgeon: Catalina Antigua, MD;  Location: [facility];  Service: Gynecology;  Laterality: N/A;   SYNOVECTOMY Left 02/10/2018   Procedure: SYNOVECTOMY;  Surgeon: Lyndle Herrlich, MD;  Location: [facility];  Service: Orthopedics;  Laterality: Left;   Social History  Tobacco Use   Smoking status: Never   Smokeless tobacco: Never   Tobacco comments:    Never smoked  Vaping Use   Vaping status: Never Used  Substance Use Topics   Alcohol use: Never   Drug use: Never   Family History  Problem Relation Age of Onset   Diabetes Mother    Heart disease Mother        Pacemaker, CHF   Hypertension Mother    Kidney cancer Mother    Cancer Mother    Stroke Father    CAD Father 76       Died with MI   Heart disease Father    Esophageal cancer Brother    Diabetes Brother    Heart disease Sister    Cancer Brother    Diabetes Sister    Colon cancer Neg Hx    Rectal cancer Neg Hx    Stomach cancer Neg Hx    Pancreatic cancer Neg Hx    Allergies  Allergen Reactions   Cholestyramine Other (See Comments)    REACTION: Muscles tightened up, drew up.   Simvastatin Other (See Comments)    Muscular pain   Lipitor [Atorvastatin] Other (See Comments)    Muscle cramping   Niacin Nausea And Vomiting   Current Outpatient Medications on File Prior to Visit  Medication Sig Dispense Refill   ACCU-CHEK AVIVA PLUS test strip TEST BLOOD SUGAR EVERY DAY FOR DIABETES 100 strip 3   Accu-Chek Softclix Lancets lancets TEST BLOOD SUGAR EVERY DAY 100 each 0   acetaminophen (TYLENOL) 500 MG tablet Take 1,000 mg by mouth every 6 (six) hours as needed for mild pain.      Alcohol Swabs (DROPSAFE ALCOHOL PREP) 70 % PADS USE ONE TIME DAILY AS  DIRECTED WHEN CHECKING BLOOD SUGAR 100 each 0   amLODipine (NORVASC) 5 MG tablet Take 1 tablet (5 mg total) by mouth daily. 90 tablet 0   aspirin 81 MG tablet Take 81 mg by mouth at bedtime.      Blood Glucose Calibration (ACCU-CHEK AVIVA) SOLN USE TO CALIBRATE METER AS DIRECTED 1 each 2   Blood Glucose Monitoring Suppl (ACCU-CHEK AVIVA PLUS) w/Device KIT Use to check blood sugar once daily for DM (dx. E11.9) 1 kit 0   calcium elemental as carbonate (BARIATRIC TUMS ULTRA) 400 MG chewable tablet Chew 1,000 mg by mouth daily as needed for heartburn.     Camphor-Menthol-Methyl Sal (SALONPAS) 3.07-19-08 % PTCH Apply topically.     carvedilol (COREG) 25 MG tablet Take 1 tablet (25 mg total) by mouth 2 (two) times daily. 180 tablet 0   Cholecalciferol (VITAMIN D) 2000 units tablet Take 2,000 Units by mouth daily.     Cyanocobalamin (B-12) 2500 MCG SUBL Place 1 tablet under the tongue daily. 30 tablet 5   Docusate Sodium (STOOL SOFTENER) 100 MG capsule Take 100 mg by mouth in the morning and at bedtime.     ezetimibe (ZETIA) 10 MG tablet Take 1 tablet (10 mg total) by mouth daily. 90 tablet 2   fenofibrate (TRICOR) 145 MG tablet TAKE 1 TABLET EVERY DAY 90 tablet 0   Ferrous Sulfate (IRON PO) Take 65 mg by mouth daily.     gabapentin (NEURONTIN) 300 MG capsule TAKE 1 CAPSULE THREE TIMES DAILY AS NEEDED 90 capsule 3   glipiZIDE (GLUCOTROL XL) 5 MG 24 hr tablet Take 1 tablet (5 mg total) by mouth daily with breakfast. 90 tablet 2   loratadine (CLARITIN) 10 MG  tablet Take 10 mg by mouth daily as needed for allergies.     losartan (COZAAR) 100 MG tablet Take 1 tablet (100 mg total) by mouth daily. 90 tablet 0   meclizine (ANTIVERT) 25 MG tablet Take 1 tablet (25 mg total) by mouth 3 (three) times daily as needed for dizziness. 90 tablet 3   meloxicam (MOBIC) 15 MG tablet TAKE 1 TABLET(15 MG) BY MOUTH DAILY 30 tablet 1   metFORMIN (GLUCOPHAGE) 1000 MG tablet TAKE 1/2 TABLET TWICE DAILY WITH MEALS 90 tablet 2    NEXLETOL 180 MG TABS Take 1 tablet (180 mg total) by mouth daily. 90 tablet 2   omeprazole (PRILOSEC) 40 MG capsule TAKE 1 CAPSULE IN THE MORNING AND AT BEDTIME. 180 capsule 3   ondansetron (ZOFRAN) 4 MG tablet TAKE 1 TABLET EVERY 4 HOURS AS NEEDED FOR NAUSEA AND VOMITING 90 tablet 0   SALINE NASAL SPRAY NA Place 1 spray into the nose daily as needed (congestion).      No current facility-administered medications on file prior to visit.    Review of Systems  Constitutional:  Negative for activity change, appetite change, fatigue, fever and unexpected weight change.  HENT:  Negative for congestion, ear pain, rhinorrhea, sinus pressure and sore throat.   Eyes:  Negative for pain, redness and visual disturbance.  Respiratory:  Negative for cough, shortness of breath and wheezing.   Cardiovascular:  Negative for chest pain and palpitations.  Gastrointestinal:  Negative for abdominal pain, blood in stool, constipation and diarrhea.  Endocrine: Negative for polydipsia and polyuria.  Genitourinary:  Negative for dysuria, frequency and urgency.  Musculoskeletal:  Positive for arthralgias and back pain. Negative for myalgias.  Skin:  Negative for pallor and rash.  Allergic/Immunologic: Negative for environmental allergies.  Neurological:  Negative for dizziness, syncope and headaches.  Hematological:  Negative for adenopathy. Does not bruise/bleed easily.  Psychiatric/Behavioral:  Negative for decreased concentration and dysphoric mood. The patient is not nervous/anxious.        Objective:   Physical Exam Constitutional:      General: She is not in acute distress.    Appearance: Normal appearance. She is well-developed. She is obese. She is not ill-appearing or diaphoretic.  HENT:     Head: Normocephalic and atraumatic.  Eyes:     Conjunctiva/sclera: Conjunctivae normal.     Pupils: Pupils are equal, round, and reactive to light.  Neck:     Thyroid: No thyromegaly.     Vascular: No  carotid bruit or JVD.  Cardiovascular:     Rate and Rhythm: Normal rate and regular rhythm.     Heart sounds: Normal heart sounds.     No gallop.  Pulmonary:     Effort: Pulmonary effort is normal. No respiratory distress.     Breath sounds: Normal breath sounds. No wheezing or rales.  Abdominal:     General: There is no distension or abdominal bruit.     Palpations: Abdomen is soft.  Musculoskeletal:     Cervical back: Normal range of motion and neck supple.     Right lower leg: No edema.     Left lower leg: No edema.  Lymphadenopathy:     Cervical: No cervical adenopathy.  Skin:    General: Skin is warm and dry.     Coloration: Skin is not pale.     Findings: No rash.  Neurological:     Mental Status: She is alert.     Coordination: Coordination normal.  Deep Tendon Reflexes: Reflexes are normal and symmetric. Reflexes normal.  Psychiatric:        Mood and Affect: Mood normal.           Assessment & Plan:   Problem List Items Addressed This Visit       Cardiovascular and Mediastinum   Essential hypertension   bp is still not at goal/ but improved  BP Readings from Last 1 Encounters:  10/21/23 126/72   Is taking ibuprofen and had recent cortisone shot -could impact readings Plan to continue  Losartan 100 mg (takes 50 mg bid which she tolerates well) Metoprolol xl 100 mg daily in am (? If change to pm would help in future) Amlodipine 2.5 mg daily -now 5 mg daily and better controlled Most recent labs reviewed  Disc lifstyle change with low sodium diet and exercise   Continues cardiology follow up -doing well        Relevant Orders   Basic metabolic panel with GFR (Completed)     Endocrine   Hyperlipidemia associated with type 2 diabetes mellitus (HCC)   Disc goals for lipids and reasons to control them Rev last labs with pt Rev low sat fat diet in detail Zetia 10 mg daily  Tricor 145 mg daily  Bempedoic acid 180 mg daily   H/p statin  myopathy LDL of 47 Trig 308- blood glucose may play role       Relevant Medications   Semaglutide,0.25 or 0.5MG /DOS, 2 MG/3ML SOPN   Diabetes type 2, controlled (HCC) - Primary   Lab Results  Component Value Date   HGBA1C 6.4 (A) 10/21/2023   HGBA1C 6.0 (A) 05/06/2023   HGBA1C 7.6 (H) 01/19/2023   Metformin 500 mg bid Glipizide xl 5 mg daily   Discussed trial of glp-1 for weight and DM / may also help renal status  Handouts given on ozempic and mounjoro generics Sent ozempic 0.25 mg to pharmacy  Disc option of GLP medication including possible side effects like GI intolerance and risk of thyroid and endocrine cancer, pancreatitis and gallstones, kidney problems and diabetic retinopathy  Will see if covered and which brand  Then schedule nurse visit to teach self admin   Next appointment is in July for annual / may follow up earlier if needed   Microalb today (may improve with better blood pressure) Also bmet   Encouraged low glycemic diet  Keep walking  Add more resistance exercise if able        Relevant Medications   Semaglutide,0.25 or 0.5MG /DOS, 2 MG/3ML SOPN   Other Relevant Orders   POCT HgB A1C (Completed)   Microalbumin / creatinine urine ratio (Completed)     Musculoskeletal and Integument   Statin myopathy   Taking zetia and tricor and bempedoic acid  Under card care         Genitourinary   Chronic kidney disease (CKD) stage G3b/A1, moderately decreased glomerular filtration rate (GFR) between 30-44 mL/min/1.73 square meter and albuminuria creatinine ratio less than 30 mg/g (HCC)   Lab today  Last GFR 56  Drinking fluids  Taking arb       Relevant Orders   Basic metabolic panel with GFR (Completed)     Hematopoietic and Hemostatic   Essential (hemorrhagic) thrombocythemia (HCC)   Plt 334 in dec  Under heme care

## 2023-10-21 NOTE — Assessment & Plan Note (Signed)
 bp is still not at goal/ but improved  BP Readings from Last 1 Encounters:  10/21/23 126/72   Is taking ibuprofen and had recent cortisone shot -could impact readings Plan to continue  Losartan 100 mg (takes 50 mg bid which she tolerates well) Metoprolol xl 100 mg daily in am (? If change to pm would help in future) Amlodipine 2.5 mg daily -now 5 mg daily and better controlled Most recent labs reviewed  Disc lifstyle change with low sodium diet and exercise   Continues cardiology follow up -doing well

## 2023-10-21 NOTE — Assessment & Plan Note (Signed)
 Disc goals for lipids and reasons to control them Rev last labs with pt Rev low sat fat diet in detail Zetia 10 mg daily  Tricor 145 mg daily  Bempedoic acid 180 mg daily   H/p statin myopathy LDL of 47 Trig 308- blood glucose may play role

## 2023-10-21 NOTE — Assessment & Plan Note (Signed)
 Plt 334 in dec  Under heme care

## 2023-10-21 NOTE — Assessment & Plan Note (Signed)
 Taking zetia and tricor and bempedoic acid  Under card care

## 2023-10-27 ENCOUNTER — Other Ambulatory Visit: Payer: Self-pay | Admitting: Family Medicine

## 2023-10-27 NOTE — Telephone Encounter (Unsigned)
 Copied from CRM [id number]. Topic: Clinical - Medication Refill >> Oct 27, 2023  5:12 PM Keitha Pata L wrote: Most Recent Primary Care Visit:  Provider: Deri Fleet A  Department: [facility]  Visit Type: OFFICE VISIT  Date: 10/21/2023  Medication: OZEMPIC  Has the patient contacted their pharmacy? Yes (Agent: If no, request that the patient contact the pharmacy for the refill. If patient does not wish to contact the pharmacy document the reason why and proceed with request.) (Agent: If yes, when and what did the pharmacy advise?)  Is this the correct pharmacy for this prescription? Yes If no, delete pharmacy and type the correct one.  This is the patient's preferred pharmacy:  [facility] - [place], [place] - [address] [place] [address] Phone: [phone] Fax: [phone]  [id number] - [place], [place] - [address] [facility] [address] [facility] [address] [place] [address] Phone: [phone] Fax: [phone]   Has the prescription been filled recently? No  Is the patient out of the medication? No  Has the patient been seen for an appointment in the last year OR does the patient have an upcoming appointment? Yes  Can we respond through MyChart? No  Agent: Please be advised that Rx refills may take up to 3 business days. We ask that you follow-up with your pharmacy.

## 2023-10-30 ENCOUNTER — Encounter: Payer: Self-pay | Admitting: Oncology

## 2023-10-30 ENCOUNTER — Other Ambulatory Visit (HOSPITAL_COMMUNITY): Payer: Self-pay

## 2023-10-30 ENCOUNTER — Telehealth: Payer: Self-pay | Admitting: Pharmacy Technician

## 2023-10-30 DIAGNOSIS — E119 Type 2 diabetes mellitus without complications: Secondary | ICD-10-CM | POA: Diagnosis not present

## 2023-10-30 DIAGNOSIS — M5412 Radiculopathy, cervical region: Secondary | ICD-10-CM | POA: Diagnosis not present

## 2023-10-30 NOTE — Telephone Encounter (Signed)
 Pharmacy Patient Advocate Encounter   Received notification from Fax that prior authorization for Nexletol  is required/requested.   Insurance verification completed.   The patient is insured through [employer] .   Per test claim: Refill too soon. PA is not needed at this time. Medication was filled 09/16/23. Next eligible fill date is 11/30/23.   PA on file until 04/14/23--  07/13/24

## 2023-11-03 ENCOUNTER — Ambulatory Visit: Admitting: Family Medicine

## 2023-11-03 ENCOUNTER — Encounter: Payer: Self-pay | Admitting: Family Medicine

## 2023-11-03 VITALS — BP 131/60 | HR 84 | Temp 98.0°F | Ht 59.0 in | Wt 180.0 lb

## 2023-11-03 DIAGNOSIS — Z7985 Long-term (current) use of injectable non-insulin antidiabetic drugs: Secondary | ICD-10-CM | POA: Diagnosis not present

## 2023-11-03 DIAGNOSIS — E669 Obesity, unspecified: Secondary | ICD-10-CM

## 2023-11-03 DIAGNOSIS — E119 Type 2 diabetes mellitus without complications: Secondary | ICD-10-CM | POA: Diagnosis not present

## 2023-11-03 DIAGNOSIS — K760 Fatty (change of) liver, not elsewhere classified: Secondary | ICD-10-CM | POA: Diagnosis not present

## 2023-11-03 DIAGNOSIS — I10 Essential (primary) hypertension: Secondary | ICD-10-CM

## 2023-11-03 DIAGNOSIS — Z7984 Long term (current) use of oral hypoglycemic drugs: Secondary | ICD-10-CM | POA: Diagnosis not present

## 2023-11-03 DIAGNOSIS — N1832 Chronic kidney disease, stage 3b: Secondary | ICD-10-CM | POA: Diagnosis not present

## 2023-11-03 NOTE — Assessment & Plan Note (Addendum)
 With co morbidity of DM2 and HTN and fatty liver  Also oa knees /joint pain   Plan to try ozempic  for DM2 in hopes it will help with weight loss   Discussed how this problem influences overall health and the risks it imposes  Reviewed plan for weight loss with lower calorie diet (via better food choices (lower glycemic and portion control) along with exercise building up to or more than 30 minutes 5 days per week including some aerobic activity and strength training    Encouraged more muscle building exercise as well

## 2023-11-03 NOTE — Patient Instructions (Addendum)
 Go ahead and start the ozempic  shot once weekly  Hold the glipizide  (put it aside)   If you have any side effects like nausea/ GI upset / diarrhea or constipation  Also a full feeling and reduced appetite   We will start with 0.25 mg weekly for a month  We then go up monthly to 0.5 , then 1 , then 2 if tolerated  After the first month , message or call us  to go up to the next dose  If any intolerable side effects call us  earlier   Watch out for symptoms of low glucose   If you need any surgery in the future -we would hold the ozempic    Even if not hungry -try to eat regularly /even if small amounts Make sure you get enough protein  The following are examples of protein in diet  Meat  Fish  Eggs  Dairy products  Soy products  Oat milk  Almond milk Nuts and nut butters  Legumes  Dried beans   For exercise - use your floor pedaler in addition to walking (as tolerated) Add some strength training to your routine, this is important for bone and brain health and can reduce your risk of falls and help your body use insulin properly and regulate weight  Light weights, exercise bands , and internet videos are a good way to start  Yoga (chair or regular), machines , floor exercises or a gym with machines are also good options    Start using bands and weights for this to prevent loss of muscle   Follow up in July as planned  Earlier if needed

## 2023-11-03 NOTE — Assessment & Plan Note (Signed)
 bp in fair control at this time  BP Readings from Last 1 Encounters:  11/03/23 131/60  Better on 2nd check today  No changes needed Most recent labs reviewed  Disc lifstyle change with low sodium diet and exercise  Continuest Losartan  50 mg bid Metoprolol  xl 100 mg daily  Amlodipine  5 mg daily

## 2023-11-03 NOTE — Assessment & Plan Note (Signed)
 Lab Results  Component Value Date   HGBA1C 6.4 (A) 10/21/2023   HGBA1C 6.0 (A) 05/06/2023   HGBA1C 7.6 (H) 01/19/2023   Metformin  500 mg bid  Glipizide  xl 5 mg daily  Was able to get ozempic  filled for 0.25  Wants to start this and is confident she can do the injection herself   Disc option of GLP medication including possible side effects like GI intolerance and risk of thyroid  and endocrine cancer, pancreatitis and gallstones, kidney problems and diabetic retinopathy  Instructed to hold glipizide  as soon as she starts this Call if any intolerable side effects  Will work on strength building exercise and protein intake  Monitor blood sugar  Has follow up 3 mo but can follow up earlier if needed   If well tolerated will increase dose monthly until 2 mg weekly  Hoping this will help with weight loss

## 2023-11-03 NOTE — Progress Notes (Signed)
 Subjective:    Patient ID: Martha Hamilton, female    DOB: 09-01-1950, 73 y.o.   MRN: [id number]  HPI  Wt Readings from Last 3 Encounters:  11/03/23 180 lb (81.6 kg)  10/21/23 177 lb 8 oz (80.5 kg)  09/16/23 179 lb (81.2 kg)   36.36 kg/m  Vitals:   11/03/23 1357 11/03/23 1422  BP: (!) 146/64 131/60  Pulse: 84   Temp: 98 F (36.7 C)   SpO2: 96%      Pt presents for follow up of DM2 and chronic medical problem  HTN bp is stable today  No cp or palpitations or headaches or edema  No side effects to medicines  BP Readings from Last 3 Encounters:  11/03/23 131/60  10/21/23 126/72  09/16/23 138/80    Losartan  50 mg bid  Metoprolol  xl 100 mg daily  Amlodipine  5 mg daily    DM2 Lab Results  Component Value Date   HGBA1C 6.4 (A) 10/21/2023   HGBA1C 6.0 (A) 05/06/2023   HGBA1C 7.6 (H) 01/19/2023   Metformin  500 mg bid  Glipizide  xl 5 mg daily   Encouraged more resistance training type exercise last visit   Was able to get GLP-1 approved   Has zofran  from Dr Elvin Hammer on hand if needed (history of gastric surgery for GERd in past) and takes ppi   Is good about protein intake     Lab Results  Component Value Date   MICROALBUR 2.0 (H) 10/21/2023   MICROALBUR 4.9 (H) 01/19/2023  Ratio of 12.4 - normal  Taking losartan      Really wants go exercise more  Uses cane / and has knee problems   Has 3 lb weights and resistance bands    Ckd Lab Results  Component Value Date   NA 138 10/21/2023   K 4.6 10/21/2023   CO2 27 10/21/2023   GLUCOSE 115 (H) 10/21/2023   BUN 23 10/21/2023   CREATININE 0.96 10/21/2023   CALCIUM 8.7 10/21/2023   GFR 59.01 (L) 10/21/2023   GFRNONAA 56 (L) 03/06/2023   Further improved      Patient Active Problem List   Diagnosis Date Noted   Thyroid  nodule 06/29/2023   Encounter for hepatitis C screening test for low risk patient 06/09/2023   Pedal edema 11/12/2022   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   Cervical spine  degeneration 03/03/2022   Muscle cramps 02/28/2022   Neck pain 02/28/2022   Syncope 12/18/2021   Strain of flexor tendon of wrist 12/02/2021   Tendinitis of left wrist 12/02/2021   Left wrist pain 11/13/2021   Family history of cancer 08/27/2020   History of breast cancer 08/27/2020   IDA (iron  deficiency anemia) 08/27/2020   B12 deficiency 08/27/2020   Chronic kidney disease (CKD) stage G3b/A1, moderately decreased glomerular filtration rate (GFR) between 30-44 mL/min/1.73 square meter and albuminuria creatinine ratio less than 30 mg/g (HCC) 07/24/2020   Tachycardia 01/11/2019   Fatty liver 06/21/2018   Obesity (BMI 30-39.9) 06/21/2018   Dyspepsia 05/25/2018   GERD (gastroesophageal reflux disease) 05/25/2018   H/O vertigo 05/11/2018   Essential (hemorrhagic) thrombocythemia (HCC) 12/25/2017   Tear of medial meniscus of knee 12/21/2017   Pain in left knee 12/21/2017   Left knee pain 09/21/2017   Welcome to Medicare preventive visit 12/17/2016   Grief reaction 12/17/2016   Estrogen deficiency 09/08/2016   Myalgia 02/08/2016   Normocytic anemia 02/09/2013   Abnormal EKG 07/27/2012   Special screening for malignant neoplasms, colon 04/29/2011  Encounter for gynecological examination 04/29/2011   Routine general medical examination at a health care facility 04/20/2011   Osteoarthrosis, hand 10/31/2008   Diabetes type 2, controlled (HCC) 07/10/2008   Hyperlipidemia associated with type 2 diabetes mellitus (HCC) 01/26/2008   Essential hypertension 01/26/2008   Allergic rhinitis 12/07/2007   Past Medical History:  Diagnosis Date   Allergic rhinitis    Allergy    Asthma    Breast cancer [facility] 1996   right breast   Cataract Oct. 2021   Depression 1981   DM2 (diabetes mellitus, type 2) (HCC)    GERD (gastroesophageal reflux disease)    Hx of cardiovascular stress test    a. Lex MV 2/14:  EF 69%, no ischemia   Hyperlipidemia    Hypertension    IDA (iron  deficiency anemia)  08/27/2020   Iron  deficiency anemia    Obesity    Osteoarthritis    hands   Thyroid  nodule 06/29/2023   Ulcer    Past Surgical History:  Procedure Laterality Date   APPENDECTOMY  1976   BREAST LUMPECTOMY Right 1996   right breast, lumpectomy w/nodes   CATARACT EXTRACTION W/PHACO Left 11/28/2020   Procedure: CATARACT EXTRACTION PHACO AND INTRAOCULAR LENS PLACEMENT (IOC) LEFT DIABETIC;  Surgeon: Annell Kidney, MD;  Location: [facility];  Service: Ophthalmology;  Laterality: Left;  5.02 01:07.4 7.4%   CATARACT EXTRACTION W/PHACO Right 12/12/2020   Procedure: CATARACT EXTRACTION PHACO AND INTRAOCULAR LENS PLACEMENT (IOC) RIGHT DIABETIC 5.22 00:59.2;  Surgeon: Annell Kidney, MD;  Location: [facility];  Service: Ophthalmology;  Laterality: Right;   CHONDROPLASTY Left 02/10/2018   Procedure: CHONDROPLASTY;  Surgeon: Jerlyn Moons, MD;  Location: [facility];  Service: Orthopedics;  Laterality: Left;   EYE SURGERY     FOOT SURGERY Left 07/2022   bone removal   GASTRIC RESTRICTION SURGERY     for reflux   HYSTEROSCOPY WITH D & C N/A 10/18/2012   Procedure: DILATATION AND CURETTAGE /HYSTEROSCOPY;  Surgeon: Verlyn Goad, MD;  Location: [facility];  Service: Gynecology;  Laterality: N/A;   KNEE ARTHROSCOPY WITH MEDIAL MENISECTOMY Left 02/10/2018   Procedure: KNEE ARTHROSCOPY WITH MEDIAL and LATERAL MENISECTOMY;  Surgeon: Jerlyn Moons, MD;  Location: [facility];  Service: Orthopedics;  Laterality: Left;   POLYPECTOMY N/A 10/18/2012   Procedure: POLYPECTOMY;  Surgeon: Verlyn Goad, MD;  Location: [facility];  Service: Gynecology;  Laterality: N/A;   SYNOVECTOMY Left 02/10/2018   Procedure: SYNOVECTOMY;  Surgeon: Jerlyn Moons, MD;  Location: [facility];  Service: Orthopedics;  Laterality: Left;   Social History   Tobacco Use   Smoking status: Never   Smokeless tobacco: Never   Tobacco comments:    Never smoked  Vaping Use   Vaping status: Never Used   Substance Use Topics   Alcohol use: Never   Drug use: Never   Family History  Problem Relation Age of Onset   Diabetes Mother    Heart disease Mother        Pacemaker, CHF   Hypertension Mother    Kidney cancer Mother    Cancer Mother    Stroke Father    CAD Father 56       Died with MI   Heart disease Father    Esophageal cancer Brother    Diabetes Brother    Heart disease Sister    Cancer Brother    Diabetes Sister    Colon cancer Neg Hx    Rectal cancer Neg Hx  Stomach cancer Neg Hx    Pancreatic cancer Neg Hx    Allergies  Allergen Reactions   Cholestyramine Other (See Comments)    REACTION: Muscles tightened up, drew up.   Simvastatin  Other (See Comments)    Muscular pain   Lipitor [Atorvastatin] Other (See Comments)    Muscle cramping   Niacin Nausea And Vomiting   Current Outpatient Medications on File Prior to Visit  Medication Sig Dispense Refill   ACCU-CHEK AVIVA PLUS test strip TEST BLOOD SUGAR EVERY DAY FOR DIABETES 100 strip 3   Accu-Chek Softclix Lancets lancets TEST BLOOD SUGAR EVERY DAY 100 each 0   acetaminophen  (TYLENOL ) 500 MG tablet Take 1,000 mg by mouth every 6 (six) hours as needed for mild pain.      Alcohol Swabs (DROPSAFE ALCOHOL PREP) 70 % PADS USE ONE TIME DAILY AS DIRECTED WHEN CHECKING BLOOD SUGAR 100 each 0   amitriptyline  (ELAVIL ) 25 MG tablet Take 1 tablet (25 mg total) by mouth at bedtime. 90 tablet 2   amLODipine  (NORVASC ) 5 MG tablet Take 1 tablet (5 mg total) by mouth daily. 90 tablet 0   aspirin 81 MG tablet Take 81 mg by mouth at bedtime.      Blood Glucose Calibration (ACCU-CHEK AVIVA) SOLN USE TO CALIBRATE METER AS DIRECTED 1 each 2   Blood Glucose Monitoring Suppl (ACCU-CHEK AVIVA PLUS) w/Device KIT Use to check blood sugar once daily for DM (dx. E11.9) 1 kit 0   calcium elemental as carbonate (BARIATRIC TUMS ULTRA) 400 MG chewable tablet Chew 1,000 mg by mouth daily as needed for heartburn.     Camphor-Menthol-Methyl Sal  (SALONPAS) 3.07-19-08 % PTCH Apply topically.     carvedilol  (COREG ) 25 MG tablet Take 1 tablet (25 mg total) by mouth 2 (two) times daily. 180 tablet 0   Cholecalciferol (VITAMIN D) 2000 units tablet Take 2,000 Units by mouth daily.     Cyanocobalamin  (B-12) 2500 MCG SUBL Place 1 tablet under the tongue daily. 30 tablet 5   Docusate Sodium  (STOOL SOFTENER) 100 MG capsule Take 100 mg by mouth in the morning and at bedtime.     ezetimibe  (ZETIA ) 10 MG tablet Take 1 tablet (10 mg total) by mouth daily. 90 tablet 2   fenofibrate  (TRICOR ) 145 MG tablet TAKE 1 TABLET EVERY DAY 90 tablet 0   Ferrous Sulfate  (IRON  PO) Take 65 mg by mouth daily.     gabapentin  (NEURONTIN ) 300 MG capsule TAKE 1 CAPSULE THREE TIMES DAILY AS NEEDED 90 capsule 3   glipiZIDE  (GLUCOTROL  XL) 5 MG 24 hr tablet Take 1 tablet (5 mg total) by mouth daily with breakfast. 90 tablet 2   loratadine (CLARITIN) 10 MG tablet Take 10 mg by mouth daily as needed for allergies.     losartan  (COZAAR ) 100 MG tablet Take 1 tablet (100 mg total) by mouth daily. 90 tablet 0   meclizine  (ANTIVERT ) 25 MG tablet Take 1 tablet (25 mg total) by mouth 3 (three) times daily as needed for dizziness. 90 tablet 3   meloxicam  (MOBIC ) 15 MG tablet TAKE 1 TABLET(15 MG) BY MOUTH DAILY 30 tablet 1   metFORMIN  (GLUCOPHAGE ) 1000 MG tablet TAKE 1/2 TABLET TWICE DAILY WITH MEALS 90 tablet 2   NEXLETOL  180 MG TABS Take 1 tablet (180 mg total) by mouth daily. 90 tablet 2   omeprazole  (PRILOSEC) 40 MG capsule TAKE 1 CAPSULE IN THE MORNING AND AT BEDTIME. 180 capsule 3   ondansetron  (ZOFRAN ) 4 MG tablet TAKE  1 TABLET EVERY 4 HOURS AS NEEDED FOR NAUSEA AND VOMITING 90 tablet 0   SALINE NASAL SPRAY NA Place 1 spray into the nose daily as needed (congestion).      Semaglutide ,0.25 or 0.5MG /DOS, 2 MG/3ML SOPN Inject 0.25 mg into the skin once a week. (Patient not taking: Reported on 11/03/2023) 3 mL 0   No current facility-administered medications on file prior to visit.     Review of Systems  Constitutional:  Negative for activity change, appetite change, fatigue, fever and unexpected weight change.  HENT:  Negative for congestion, ear pain, rhinorrhea, sinus pressure and sore throat.   Eyes:  Negative for pain, redness and visual disturbance.  Respiratory:  Negative for cough, shortness of breath and wheezing.   Cardiovascular:  Negative for chest pain and palpitations.  Gastrointestinal:  Negative for abdominal pain, blood in stool, constipation and diarrhea.  Endocrine: Negative for polydipsia and polyuria.  Genitourinary:  Negative for dysuria, frequency and urgency.  Musculoskeletal:  Positive for arthralgias. Negative for back pain and myalgias.       Knee pain with walking   Skin:  Negative for pallor and rash.  Allergic/Immunologic: Negative for environmental allergies.  Neurological:  Negative for dizziness, syncope and headaches.  Hematological:  Negative for adenopathy. Does not bruise/bleed easily.  Psychiatric/Behavioral:  Negative for decreased concentration and dysphoric mood. The patient is not nervous/anxious.        Objective:   Physical Exam Constitutional:      General: She is not in acute distress.    Appearance: Normal appearance. She is obese. She is not ill-appearing.  HENT:     Head: Normocephalic and atraumatic.  Cardiovascular:     Rate and Rhythm: Normal rate and regular rhythm.  Pulmonary:     Effort: Pulmonary effort is normal.  Musculoskeletal:     Cervical back: Neck supple.  Skin:    Coloration: Skin is not pale.     Findings: No erythema.  Neurological:     Mental Status: She is alert.     Cranial Nerves: No cranial nerve deficit.     Motor: No weakness.  Psychiatric:        Mood and Affect: Mood normal.           Assessment & Plan:   Problem List Items Addressed This Visit       Cardiovascular and Mediastinum   Essential hypertension   bp in fair control at this time  BP Readings from Last  1 Encounters:  11/03/23 131/60  Better on 2nd check today  No changes needed Most recent labs reviewed  Disc lifstyle change with low sodium diet and exercise  Continuest Losartan  50 mg bid Metoprolol  xl 100 mg daily  Amlodipine  5 mg daily             Digestive   Fatty liver   Planning to start ozempic  for DM2 which should help with weight loss for fatty liver         Endocrine   Diabetes type 2, controlled (HCC) - Primary   Lab Results  Component Value Date   HGBA1C 6.4 (A) 10/21/2023   HGBA1C 6.0 (A) 05/06/2023   HGBA1C 7.6 (H) 01/19/2023   Metformin  500 mg bid  Glipizide  xl 5 mg daily  Was able to get ozempic  filled for 0.25  Wants to start this and is confident she can do the injection herself   Disc option of GLP medication including possible side effects like GI  intolerance and risk of thyroid  and endocrine cancer, pancreatitis and gallstones, kidney problems and diabetic retinopathy  Instructed to hold glipizide  as soon as she starts this Call if any intolerable side effects  Will work on strength building exercise and protein intake  Monitor blood sugar  Has follow up 3 mo but can follow up earlier if needed   If well tolerated will increase dose monthly until 2 mg weekly  Hoping this will help with weight loss            Genitourinary   Chronic kidney disease (CKD) stage G3b/A1, moderately decreased glomerular filtration rate (GFR) between 30-44 mL/min/1.73 square meter and albuminuria creatinine ratio less than 30 mg/g (HCC)   Most recent GFR 59.01 mildly improved Microalb normal range also -reassuring  Is working on fluid intake         Other   Obesity (BMI 30-39.9)   With co morbidity of DM2 and HTN and fatty liver  Also oa knees /joint pain   Plan to try ozempic  for DM2 in hopes it will help with weight loss   Discussed how this problem influences overall health and the risks it imposes  Reviewed plan for weight loss with lower calorie  diet (via better food choices (lower glycemic and portion control) along with exercise building up to or more than 30 minutes 5 days per week including some aerobic activity and strength training    Encouraged more muscle building exercise as well

## 2023-11-03 NOTE — Assessment & Plan Note (Signed)
 Most recent GFR 59.01 mildly improved Microalb normal range also -reassuring  Is working on fluid intake

## 2023-11-03 NOTE — Assessment & Plan Note (Signed)
 Planning to start ozempic  for DM2 which should help with weight loss for fatty liver

## 2023-11-11 DIAGNOSIS — Z961 Presence of intraocular lens: Secondary | ICD-10-CM | POA: Diagnosis not present

## 2023-11-11 DIAGNOSIS — E119 Type 2 diabetes mellitus without complications: Secondary | ICD-10-CM | POA: Diagnosis not present

## 2023-11-11 LAB — HM DIABETES EYE EXAM

## 2023-11-16 ENCOUNTER — Other Ambulatory Visit: Payer: Self-pay

## 2023-11-16 MED ORDER — AMLODIPINE BESYLATE 5 MG PO TABS: 5.0000 mg | ORAL_TABLET | Freq: Every day | ORAL | 3 refills | Status: DC

## 2023-11-16 MED ORDER — CARVEDILOL 25 MG PO TABS: 25.0000 mg | ORAL_TABLET | Freq: Two times a day (BID) | ORAL | 3 refills | Status: AC

## 2023-11-16 MED ORDER — LOSARTAN POTASSIUM 100 MG PO TABS: 100.0000 mg | ORAL_TABLET | Freq: Every day | ORAL | 3 refills | Status: DC

## 2023-11-16 NOTE — Telephone Encounter (Unsigned)
 Copied from CRM [id number]. Topic: Clinical - Prescription Issue >> Nov 16, 2023  4:21 PM Baldomero Bone wrote: Reason for CRM: Patient received a letter from her insurance company stating the refill for amitriptyline  (ELAVIL ) 25 MG tablet being denied by provider. Is the patient no longer supposed to take the medication? Callback number is [phone]

## 2023-11-20 ENCOUNTER — Other Ambulatory Visit: Payer: Self-pay | Admitting: Family Medicine

## 2023-11-26 DIAGNOSIS — M4802 Spinal stenosis, cervical region: Secondary | ICD-10-CM | POA: Diagnosis not present

## 2023-11-26 DIAGNOSIS — M5412 Radiculopathy, cervical region: Secondary | ICD-10-CM | POA: Diagnosis not present

## 2023-12-04 ENCOUNTER — Telehealth: Payer: Self-pay | Admitting: *Deleted

## 2023-12-04 NOTE — Telephone Encounter (Signed)
 Pt notified of Dr. Belva Boyden instructions and verbalized understanding. She will update us  in a week

## 2023-12-04 NOTE — Telephone Encounter (Signed)
 Copied from CRM [id number]. Topic: Clinical - Medical Advice >> Dec 04, 2023  8:06 AM Dimple Francis wrote: Reason for CRM: Patient started taking Semaglutide  last month having side effects- dry mouth. light headed, blood pressure has been dropping once a day. lowest was 91/62, pulse is 92. Dr Malissa Se told her to let her know how she was doing when taking it. The side effects hit her around 10-11am. Please reach out to patient as soon as possible as to what she should do

## 2023-12-04 NOTE — Telephone Encounter (Signed)
 Hold it  Update once it is out of system for a week and we will go from there. Follow up sooner if symptoms worsen Try and stay hydrated the best you can Let us  know if glucose levels are low also

## 2023-12-11 ENCOUNTER — Inpatient Hospital Stay: Payer: Medicare PPO | Attending: Internal Medicine

## 2023-12-11 DIAGNOSIS — E538 Deficiency of other specified B group vitamins: Secondary | ICD-10-CM | POA: Diagnosis not present

## 2023-12-11 DIAGNOSIS — D509 Iron deficiency anemia, unspecified: Secondary | ICD-10-CM | POA: Insufficient documentation

## 2023-12-11 DIAGNOSIS — D508 Other iron deficiency anemias: Secondary | ICD-10-CM

## 2023-12-11 LAB — CBC WITH DIFFERENTIAL (CANCER CENTER ONLY)
Abs Immature Granulocytes: 0.03 10*3/uL (ref 0.00–0.07)
Basophils Absolute: 0.1 10*3/uL (ref 0.0–0.1)
Basophils Relative: 2 %
Eosinophils Absolute: 0.4 10*3/uL (ref 0.0–0.5)
Eosinophils Relative: 8 %
HCT: 29.5 % — ABNORMAL LOW (ref 36.0–46.0)
Hemoglobin: 9.4 g/dL — ABNORMAL LOW (ref 12.0–15.0)
Immature Granulocytes: 1 %
Lymphocytes Relative: 27 %
Lymphs Abs: 1.2 10*3/uL (ref 0.7–4.0)
MCH: 27.7 pg (ref 26.0–34.0)
MCHC: 31.9 g/dL (ref 30.0–36.0)
MCV: 87 fL (ref 80.0–100.0)
Monocytes Absolute: 0.4 10*3/uL (ref 0.1–1.0)
Monocytes Relative: 10 %
Neutro Abs: 2.2 10*3/uL (ref 1.7–7.7)
Neutrophils Relative %: 52 %
Platelet Count: 410 10*3/uL — ABNORMAL HIGH (ref 150–400)
RBC: 3.39 MIL/uL — ABNORMAL LOW (ref 3.87–5.11)
RDW: 13.4 % (ref 11.5–15.5)
WBC Count: 4.2 10*3/uL (ref 4.0–10.5)
nRBC: 0 % (ref 0.0–0.2)

## 2023-12-11 LAB — IRON AND TIBC
Iron: 54 ug/dL (ref 28–170)
Saturation Ratios: 12 % (ref 10.4–31.8)
TIBC: 440 ug/dL (ref 250–450)
UIBC: 386 ug/dL

## 2023-12-11 LAB — FOLATE: Folate: 13.1 ng/mL (ref >5.9)

## 2023-12-11 LAB — VITAMIN B12: Vitamin B-12: 2842 pg/mL — ABNORMAL HIGH (ref 180–914)

## 2023-12-11 LAB — FERRITIN: Ferritin: 14 ng/mL (ref 11–307)

## 2023-12-12 ENCOUNTER — Other Ambulatory Visit: Payer: Self-pay | Admitting: Family Medicine

## 2023-12-15 ENCOUNTER — Encounter: Payer: Self-pay | Admitting: Oncology

## 2023-12-15 ENCOUNTER — Inpatient Hospital Stay: Payer: Medicare PPO | Attending: Internal Medicine | Admitting: Oncology

## 2023-12-15 ENCOUNTER — Inpatient Hospital Stay: Payer: Medicare PPO

## 2023-12-15 VITALS — BP 126/58 | HR 82 | Resp 18

## 2023-12-15 VITALS — BP 121/61 | HR 85 | Temp 96.6°F | Resp 19 | Wt 176.4 lb

## 2023-12-15 DIAGNOSIS — D508 Other iron deficiency anemias: Secondary | ICD-10-CM

## 2023-12-15 DIAGNOSIS — D509 Iron deficiency anemia, unspecified: Secondary | ICD-10-CM | POA: Insufficient documentation

## 2023-12-15 DIAGNOSIS — R9431 Abnormal electrocardiogram [ECG] [EKG]: Secondary | ICD-10-CM | POA: Insufficient documentation

## 2023-12-15 DIAGNOSIS — Z853 Personal history of malignant neoplasm of breast: Secondary | ICD-10-CM | POA: Diagnosis not present

## 2023-12-15 DIAGNOSIS — E538 Deficiency of other specified B group vitamins: Secondary | ICD-10-CM

## 2023-12-15 MED ORDER — IRON SUCROSE 20 MG/ML IV SOLN
200.0000 mg | Freq: Once | INTRAVENOUS | Status: AC
  Administered 2023-12-15: 200 mg via INTRAVENOUS
  Filled 2023-12-15: qty 10

## 2023-12-15 NOTE — Assessment & Plan Note (Addendum)
 Iron  deficiency anemia Chronic blood loss vs malabsorption.  She has had GI work up in the past.  Labs reviewed and discussed with patient.   Lab Results  Component Value Date   HGB 9.4 (L) 12/11/2023   TIBC 440 12/11/2023   IRONPCTSAT 12 12/11/2023   FERRITIN 14 12/11/2023   Decreased Hb.  Recommend IV venofer  weekly x 5 Recommend patient to call GI and make follow up appt.

## 2023-12-15 NOTE — Assessment & Plan Note (Addendum)
 B12 level improved. Decrease sublingual B12 2500mcg 2 times per week  Hold off B12 injection

## 2023-12-15 NOTE — Progress Notes (Signed)
 Hematology/Oncology follow up note [phone] [phone]   Patient Care Team: Tower, Manley Seeds, MD as PCP - General Constancia Delton, MD as PCP - Cardiology (Cardiology) Alta Ast, [facility] (Inactive) as Pharmacist (Pharmacist) Timmy Forbes, MD as Consulting Physician (Oncology)  ASSESSMENT & PLAN:   IDA (iron  deficiency anemia) Iron  deficiency anemia Chronic blood loss vs malabsorption.  She has had GI work up in the past.  Labs reviewed and discussed with patient.   Lab Results  Component Value Date   HGB 9.4 (L) 12/11/2023   TIBC 440 12/11/2023   IRONPCTSAT 12 12/11/2023   FERRITIN 14 12/11/2023   Decreased Hb.  Recommend IV venofer  weekly x 5 Recommend patient to call GI and make follow up appt.  B12 deficiency B12 level improved. Decrease sublingual B12 2500mcg 2 times per week  Hold off B12 injection   History of breast cancer Remote history of right breast cancer 1996 Continue annual screening mammogram.   Orders Placed This Encounter  Procedures   CBC with Differential (Cancer Center Only)    Standing Status:   Future    Expected Date:   04/15/2024    Expiration Date:   12/14/2024   Iron  and TIBC    Standing Status:   Future    Expected Date:   04/15/2024    Expiration Date:   12/14/2024   Ferritin    Standing Status:   Future    Expected Date:   04/15/2024    Expiration Date:   12/14/2024   Vitamin B12    Standing Status:   Future    Expected Date:   04/15/2024    Expiration Date:   12/14/2024   Folate    Standing Status:   Future    Expected Date:   04/15/2024    Expiration Date:   12/14/2024   Follow up  4 months. Labs prior to MD visit +/- Venofer   All questions were answered. The patient knows to call the clinic with any problems, questions or concerns.  Timmy Forbes, MD, PhD [facility] Hematology Oncology 12/15/2023    CHIEF COMPLAINTS/REASON FOR VISIT:  Follow-up for iron  deficiency anemia  HISTORY OF PRESENTING ILLNESS:  Martha Hamilton is a  73 y.o.  female with PMH listed below who was referred to me for evaluation of anemia Reviewed patient's recent labs that was done.  08/08/20 Labs revealed anemia with hemoglobin of 9.2, MCV 93 .   Reviewed patient's previous labs ordered by primary care physician's office, anemia is chronic onset , duration is since 2013, with baseline in 11s, worsened during the recent 6 months, after she stops taking oral iron  supplementation.   Associated signs and symptoms: Patient reports fatigue. denies SOB with exertion.  Denies weight loss, easy bruising, hematochezia, hemoptysis, hematuria. Context: History of GI bleeding: deneis               History of gastric restriction surgery for GERD                Last colonoscopy: 05/01/21. Last Endoscopy 05/01/21  Remote history of right breast cancer, s/p lumpectomy with node. She denies any previous chemotherapy or any anti estrogen therapy.     Family history positive for brother with esophageal cancer, mother with RCC.  I have recommended genetic testing.  She wanted to defer and if she changes her mind she will update me..   INTERVAL HISTORY Regan Mcbryar is a 73 y.o. female who has above  history reviewed by me today presents for follow up visit for iron  deficiency anemia Patient has been on monthly vitamin B12 injection.  Today she reports feeling well.  Recent left foot surgery. + worse fatigue. Denies melena or blood in stool. She has not scheduled a follow up appt with GI  Review of Systems  Constitutional:  Positive for fatigue. Negative for appetite change, chills and fever.  HENT:   Negative for hearing loss and voice change.   Eyes:  Negative for eye problems.  Respiratory:  Negative for chest tightness and cough.   Cardiovascular:  Negative for chest pain.  Gastrointestinal:  Negative for abdominal distention, abdominal pain and blood in stool.  Endocrine: Negative for hot flashes.  Genitourinary:  Negative for  difficulty urinating and frequency.   Musculoskeletal:  Negative for arthralgias.  Skin:  Negative for itching and rash.  Neurological:  Negative for extremity weakness.  Hematological:  Negative for adenopathy.  Psychiatric/Behavioral:  Negative for confusion.      MEDICAL HISTORY:  Past Medical History:  Diagnosis Date   Allergic rhinitis    Allergy    Asthma    Breast cancer [facility]) 1996   right breast   Cataract Oct. 2021   Depression 1981   DM2 (diabetes mellitus, type 2) (HCC)    GERD (gastroesophageal reflux disease)    Hx of cardiovascular stress test    a. Lex MV 2/14:  EF 69%, no ischemia   Hyperlipidemia    Hypertension    IDA (iron  deficiency anemia) 08/27/2020   Iron  deficiency anemia    Obesity    Osteoarthritis    hands   Thyroid  nodule 06/29/2023   Ulcer     SURGICAL HISTORY: Past Surgical History:  Procedure Laterality Date   APPENDECTOMY  1976   BREAST LUMPECTOMY Right 1996   right breast, lumpectomy w/nodes   CATARACT EXTRACTION W/PHACO Left 11/28/2020   Procedure: CATARACT EXTRACTION PHACO AND INTRAOCULAR LENS PLACEMENT (IOC) LEFT DIABETIC;  Surgeon: Annell Kidney, MD;  Location: [facility];  Service: Ophthalmology;  Laterality: Left;  5.02 01:07.4 7.4%   CATARACT EXTRACTION W/PHACO Right 12/12/2020   Procedure: CATARACT EXTRACTION PHACO AND INTRAOCULAR LENS PLACEMENT (IOC) RIGHT DIABETIC 5.22 00:59.2;  Surgeon: Annell Kidney, MD;  Location: [facility];  Service: Ophthalmology;  Laterality: Right;   CHONDROPLASTY Left 02/10/2018   Procedure: CHONDROPLASTY;  Surgeon: Jerlyn Moons, MD;  Location: [facility];  Service: Orthopedics;  Laterality: Left;   EYE SURGERY     FOOT SURGERY Left 07/2022   bone removal   GASTRIC RESTRICTION SURGERY     for reflux   HYSTEROSCOPY WITH D & C N/A 10/18/2012   Procedure: DILATATION AND CURETTAGE /HYSTEROSCOPY;  Surgeon: Verlyn Goad, MD;  Location: [facility];  Service: Gynecology;   Laterality: N/A;   KNEE ARTHROSCOPY WITH MEDIAL MENISECTOMY Left 02/10/2018   Procedure: KNEE ARTHROSCOPY WITH MEDIAL and LATERAL MENISECTOMY;  Surgeon: Jerlyn Moons, MD;  Location: [facility];  Service: Orthopedics;  Laterality: Left;   POLYPECTOMY N/A 10/18/2012   Procedure: POLYPECTOMY;  Surgeon: Verlyn Goad, MD;  Location: [facility];  Service: Gynecology;  Laterality: N/A;   SYNOVECTOMY Left 02/10/2018   Procedure: SYNOVECTOMY;  Surgeon: Jerlyn Moons, MD;  Location: [facility];  Service: Orthopedics;  Laterality: Left;    SOCIAL HISTORY: Social History   Socioeconomic History   Marital status: Single    Spouse name: Not on file   Number of children: 0   Years of education:  Not on file   Highest education level: Some college, no degree  Occupational History   Occupation: retired    Associate Professor: RETIRED  Tobacco Use   Smoking status: Never   Smokeless tobacco: Never   Tobacco comments:    Never smoked  Vaping Use   Vaping status: Never Used  Substance and Sexual Activity   Alcohol use: Never   Drug use: Never   Sexual activity: Not Currently    Birth control/protection: Post-menopausal  Other Topics Concern   Not on file  Social History Narrative   Lives with, and cares for her elderly mother.   Social Drivers of Corporate investment banker Strain: Low Risk  (06/27/2023)   Overall Financial Resource Strain (CARDIA)    Difficulty of Paying Living Expenses: Not very hard  Food Insecurity: No Food Insecurity (06/27/2023)   Hunger Vital Sign    Worried About Running Out of Food in the Last Year: Never true    Ran Out of Food in the Last Year: Never true  Transportation Needs: No Transportation Needs (06/27/2023)   PRAPARE - Administrator, Civil Service (Medical): No    Lack of Transportation (Non-Medical): No  Physical Activity: Inactive (06/27/2023)   Exercise Vital Sign    Days of Exercise per Week: 0 days    Minutes of Exercise per Session: 60 min   Stress: No Stress Concern Present (06/27/2023)   [employer] of Occupational Health - Occupational Stress Questionnaire    Feeling of Stress : Not at all  Social Connections: Moderately Integrated (06/27/2023)   Social Connection and Isolation Panel [NHANES]    Frequency of Communication with Friends and Family: More than three times a week    Frequency of Social Gatherings with Friends and Family: More than three times a week    Attends Religious Services: More than 4 times per year    Active Member of [employer] or Organizations: Yes    Attends Banker Meetings: More than 4 times per year    Marital Status: Never married  Intimate Partner Violence: Not At Risk (01/22/2023)   Humiliation, Afraid, Rape, and Kick questionnaire    Fear of Current or Ex-Partner: No    Emotionally Abused: No    Physically Abused: No    Sexually Abused: No    FAMILY HISTORY: Family History  Problem Relation Age of Onset   Diabetes Mother    Heart disease Mother        Pacemaker, CHF   Hypertension Mother    Kidney cancer Mother    Cancer Mother    Stroke Father    CAD Father 42       Died with MI   Heart disease Father    Esophageal cancer Brother    Diabetes Brother    Heart disease Sister    Cancer Brother    Diabetes Sister    Colon cancer Neg Hx    Rectal cancer Neg Hx    Stomach cancer Neg Hx    Pancreatic cancer Neg Hx     ALLERGIES:  is allergic to cholestyramine, simvastatin , lipitor [atorvastatin], and niacin.  MEDICATIONS:  Current Outpatient Medications  Medication Sig Dispense Refill   ACCU-CHEK AVIVA PLUS test strip TEST BLOOD SUGAR EVERY DAY FOR DIABETES 100 strip 3   Accu-Chek Softclix Lancets lancets TEST BLOOD SUGAR EVERY DAY 100 each 1   acetaminophen  (TYLENOL ) 500 MG tablet Take 1,000 mg by mouth every 6 (six) hours as needed  for mild pain.      Alcohol Swabs (DROPSAFE ALCOHOL PREP) 70 % PADS USE ONE TIME DAILY AS DIRECTED 100 each 1   amitriptyline   (ELAVIL ) 25 MG tablet Take 1 tablet (25 mg total) by mouth at bedtime. 90 tablet 2   amLODipine  (NORVASC ) 5 MG tablet Take 1 tablet (5 mg total) by mouth daily. 90 tablet 3   aspirin 81 MG tablet Take 81 mg by mouth at bedtime.      Blood Glucose Calibration (ACCU-CHEK AVIVA) SOLN USE TO CALIBRATE METER AS DIRECTED 1 each 2   Blood Glucose Monitoring Suppl (ACCU-CHEK AVIVA PLUS) w/Device KIT Use to check blood sugar once daily for DM (dx. E11.9) 1 kit 0   calcium elemental as carbonate (BARIATRIC TUMS ULTRA) 400 MG chewable tablet Chew 1,000 mg by mouth daily as needed for heartburn.     Camphor-Menthol-Methyl Sal (SALONPAS) 3.07-19-08 % PTCH Apply topically.     carvedilol  (COREG ) 25 MG tablet Take 1 tablet (25 mg total) by mouth 2 (two) times daily. 180 tablet 3   Cholecalciferol (VITAMIN D) 2000 units tablet Take 2,000 Units by mouth daily.     Cyanocobalamin  (B-12) 2500 MCG SUBL Place 1 tablet under the tongue daily. 30 tablet 5   Docusate Sodium  (STOOL SOFTENER) 100 MG capsule Take 100 mg by mouth in the morning and at bedtime.     ezetimibe  (ZETIA ) 10 MG tablet Take 1 tablet (10 mg total) by mouth daily. 90 tablet 2   fenofibrate  (TRICOR ) 145 MG tablet TAKE 1 TABLET EVERY DAY 90 tablet 1   Ferrous Sulfate  (IRON  PO) Take 65 mg by mouth daily.     gabapentin  (NEURONTIN ) 300 MG capsule TAKE 1 CAPSULE THREE TIMES DAILY AS NEEDED 90 capsule 3   glipiZIDE  (GLUCOTROL  XL) 5 MG 24 hr tablet Take 1 tablet (5 mg total) by mouth daily with breakfast. 90 tablet 2   loratadine (CLARITIN) 10 MG tablet Take 10 mg by mouth daily as needed for allergies.     losartan  (COZAAR ) 100 MG tablet Take 1 tablet (100 mg total) by mouth daily. 90 tablet 3   meclizine  (ANTIVERT ) 25 MG tablet Take 1 tablet (25 mg total) by mouth 3 (three) times daily as needed for dizziness. 90 tablet 3   meloxicam  (MOBIC ) 15 MG tablet TAKE 1 TABLET(15 MG) BY MOUTH DAILY 30 tablet 1   metFORMIN  (GLUCOPHAGE ) 1000 MG tablet TAKE 1/2 TABLET  TWICE DAILY WITH MEALS 90 tablet 2   NEXLETOL  180 MG TABS Take 1 tablet (180 mg total) by mouth daily. 90 tablet 2   omeprazole  (PRILOSEC) 40 MG capsule TAKE 1 CAPSULE IN THE MORNING AND AT BEDTIME. 180 capsule 1   ondansetron  (ZOFRAN ) 4 MG tablet TAKE 1 TABLET EVERY 4 HOURS AS NEEDED FOR NAUSEA AND VOMITING 90 tablet 0   SALINE NASAL SPRAY NA Place 1 spray into the nose daily as needed (congestion).      Semaglutide ,0.25 or 0.5MG /DOS, 2 MG/3ML SOPN Inject 0.25 mg into the skin once a week. (Patient not taking: Reported on 12/15/2023) 3 mL 0   No current facility-administered medications for this visit.     PHYSICAL EXAMINATION: ECOG PERFORMANCE STATUS: 1 - Symptomatic but completely ambulatory Vitals:   12/15/23 1324  BP: 121/61  Pulse: 85  Resp: 19  Temp: (!) 96.6 F (35.9 C)  SpO2: 97%   Filed Weights   12/15/23 1324  Weight: 176 lb 6.4 oz (80 kg)    Physical Exam Constitutional:  General: She is not in acute distress. HENT:     Head: Normocephalic and atraumatic.  Eyes:     General: No scleral icterus. Cardiovascular:     Rate and Rhythm: Normal rate.  Pulmonary:     Effort: Pulmonary effort is normal. No respiratory distress.     Breath sounds: No wheezing.  Abdominal:     General: Bowel sounds are normal. There is no distension.     Palpations: Abdomen is soft.  Musculoskeletal:        General: Normal range of motion.     Cervical back: Normal range of motion and neck supple.  Skin:    Findings: No erythema or rash.  Neurological:     Mental Status: She is alert and oriented to person, place, and time. Mental status is at baseline.  Psychiatric:        Mood and Affect: Mood normal.      LABORATORY DATA:  I have reviewed the data as listed    Latest Ref Rng & Units 12/11/2023    8:41 AM 06/15/2023   11:14 AM 03/06/2023   11:35 AM  CBC  WBC 4.0 - 10.5 K/uL 4.2  7.2  7.6   Hemoglobin 12.0 - 15.0 g/dL 9.4  81.1  9.6   Hematocrit 36.0 - 46.0 % 29.5   39.2  31.8   Platelets 150 - 400 K/uL 410  334  436       Latest Ref Rng & Units 10/21/2023    9:57 AM 03/06/2023   11:35 AM 01/19/2023    7:53 AM  CMP  Glucose 70 - 99 mg/dL 914  782  956   BUN 6 - 23 mg/dL 23  27  23    Creatinine 0.40 - 1.20 mg/dL 2.13  0.86  5.78   Sodium 135 - 145 mEq/L 138  137  138   Potassium 3.5 - 5.1 mEq/L 4.6  4.0  4.4   Chloride 96 - 112 mEq/L 100  107  102   CO2 19 - 32 mEq/L 27  21  26    Calcium 8.4 - 10.5 mg/dL 8.7  9.3  9.0   Total Protein 6.5 - 8.1 g/dL  7.1  6.9   Total Bilirubin 0.3 - 1.2 mg/dL  0.3  0.2   Alkaline Phos 38 - 126 U/L  36  33   AST 15 - 41 U/L  19  13   ALT 0 - 44 U/L  13  11      Iron /TIBC/Ferritin/ %Sat    Component Value Date/Time   IRON  54 12/11/2023 0841   TIBC 440 12/11/2023 0841   FERRITIN 14 12/11/2023 0841   IRONPCTSAT 12 12/11/2023 0841

## 2023-12-15 NOTE — Assessment & Plan Note (Signed)
Remote history of right breast cancer 1996 Continue annual screening mammogram.

## 2023-12-16 ENCOUNTER — Other Ambulatory Visit: Payer: Self-pay | Admitting: Family Medicine

## 2023-12-16 NOTE — Telephone Encounter (Unsigned)
 Copied from CRM [id number]. Topic: Clinical - Medication Question >> Dec 16, 2023  9:22 AM Deaijah H wrote: Reason for CRM: Patient would like to have Dr. Belva Boyden nurse give a call regarding a prescription. Please [phone]

## 2023-12-17 MED ORDER — TIRZEPATIDE 2.5 MG/0.5ML ~~LOC~~ SOAJ: 2.5000 mg | SUBCUTANEOUS | 0 refills | Status: DC

## 2023-12-17 NOTE — Telephone Encounter (Signed)
 Pt called back with an update. It has been a week since last message (see prev messages). Pt said all of her sxs have resolved. The nausea, light headed, low BP and dry mouth have all resolved since stopping med. Pt asked since she is back to baseline if PCP wanted her to try the other injectable PCP discussed with pt.   [facility] [address]

## 2023-12-17 NOTE — Telephone Encounter (Signed)
 Addressed in correct phone note.

## 2023-12-17 NOTE — Addendum Note (Signed)
 Addended by: Deri Fleet A on: 12/17/2023 04:53 PM   Modules accepted: Orders

## 2023-12-17 NOTE — Telephone Encounter (Signed)
 I sent in mounjaro 2.5 mg to try instead  Let's see if insurance will cover for diabetes  It is similar but perhaps she will tolerate it better If she is able to get it let us  know how she does after the first dose or two  Thanks

## 2023-12-18 NOTE — Telephone Encounter (Signed)
 Pt notified of Dr. Belva Boyden comments and will update us  once she starts the med

## 2023-12-20 ENCOUNTER — Other Ambulatory Visit: Payer: Self-pay

## 2023-12-20 ENCOUNTER — Emergency Department: Admission: EM | Admit: 2023-12-20 | Discharge: 2023-12-20 | Disposition: A | Discharge status: Home / Self Care

## 2023-12-20 DIAGNOSIS — R42 Dizziness and giddiness: Secondary | ICD-10-CM | POA: Diagnosis not present

## 2023-12-20 DIAGNOSIS — E538 Deficiency of other specified B group vitamins: Secondary | ICD-10-CM | POA: Diagnosis not present

## 2023-12-20 DIAGNOSIS — R9431 Abnormal electrocardiogram [ECG] [EKG]: Secondary | ICD-10-CM | POA: Diagnosis not present

## 2023-12-20 DIAGNOSIS — N189 Chronic kidney disease, unspecified: Secondary | ICD-10-CM | POA: Insufficient documentation

## 2023-12-20 DIAGNOSIS — Z853 Personal history of malignant neoplasm of breast: Secondary | ICD-10-CM | POA: Diagnosis not present

## 2023-12-20 DIAGNOSIS — I951 Orthostatic hypotension: Secondary | ICD-10-CM | POA: Diagnosis not present

## 2023-12-20 DIAGNOSIS — I129 Hypertensive chronic kidney disease with stage 1 through stage 4 chronic kidney disease, or unspecified chronic kidney disease: Secondary | ICD-10-CM | POA: Insufficient documentation

## 2023-12-20 DIAGNOSIS — E1122 Type 2 diabetes mellitus with diabetic chronic kidney disease: Secondary | ICD-10-CM | POA: Diagnosis not present

## 2023-12-20 DIAGNOSIS — R739 Hyperglycemia, unspecified: Secondary | ICD-10-CM | POA: Diagnosis not present

## 2023-12-20 DIAGNOSIS — I959 Hypotension, unspecified: Secondary | ICD-10-CM | POA: Diagnosis not present

## 2023-12-20 DIAGNOSIS — H538 Other visual disturbances: Secondary | ICD-10-CM | POA: Diagnosis not present

## 2023-12-20 DIAGNOSIS — R55 Syncope and collapse: Secondary | ICD-10-CM | POA: Insufficient documentation

## 2023-12-20 DIAGNOSIS — D509 Iron deficiency anemia, unspecified: Secondary | ICD-10-CM | POA: Diagnosis not present

## 2023-12-20 DIAGNOSIS — E860 Dehydration: Secondary | ICD-10-CM | POA: Diagnosis not present

## 2023-12-20 LAB — CBG MONITORING, ED: Glucose-Capillary: 171 mg/dL — ABNORMAL HIGH (ref 70–99)

## 2023-12-20 LAB — COMPREHENSIVE METABOLIC PANEL WITH GFR
ALT: 16 U/L (ref 0–44)
AST: 31 U/L (ref 15–41)
Albumin: 3.1 g/dL — ABNORMAL LOW (ref 3.5–5.0)
Alkaline Phosphatase: 44 U/L (ref 38–126)
Anion gap: 15 (ref 5–15)
BUN: 29 mg/dL — ABNORMAL HIGH (ref 8–23)
CO2: 19 mmol/L — ABNORMAL LOW (ref 22–32)
Calcium: 8.8 mg/dL — ABNORMAL LOW (ref 8.9–10.3)
Chloride: 101 mmol/L (ref 98–111)
Creatinine, Ser: 1.21 mg/dL — ABNORMAL HIGH (ref 0.44–1.00)
GFR, Estimated: 48 mL/min — ABNORMAL LOW (ref >60)
Glucose, Bld: 172 mg/dL — ABNORMAL HIGH (ref 70–99)
Potassium: 4.5 mmol/L (ref 3.5–5.1)
Sodium: 135 mmol/L (ref 135–145)
Total Bilirubin: 0.4 mg/dL (ref 0.0–1.2)
Total Protein: 6.4 g/dL — ABNORMAL LOW (ref 6.5–8.1)

## 2023-12-20 LAB — URINALYSIS, ROUTINE W REFLEX MICROSCOPIC
Bilirubin Urine: NEGATIVE
Glucose, UA: NEGATIVE mg/dL
Hgb urine dipstick: NEGATIVE
Ketones, ur: 5 mg/dL — AB
Leukocytes,Ua: NEGATIVE
Nitrite: NEGATIVE
Protein, ur: NEGATIVE mg/dL
Specific Gravity, Urine: 1.019 (ref 1.005–1.030)
pH: 5 (ref 5.0–8.0)

## 2023-12-20 LAB — CBC
HCT: 30.3 % — ABNORMAL LOW (ref 36.0–46.0)
Hemoglobin: 9.7 g/dL — ABNORMAL LOW (ref 12.0–15.0)
MCH: 28.1 pg (ref 26.0–34.0)
MCHC: 32 g/dL (ref 30.0–36.0)
MCV: 87.8 fL (ref 80.0–100.0)
Platelets: 368 10*3/uL (ref 150–400)
RBC: 3.45 MIL/uL — ABNORMAL LOW (ref 3.87–5.11)
RDW: 14.6 % (ref 11.5–15.5)
WBC: 6 10*3/uL (ref 4.0–10.5)
nRBC: 0 % (ref 0.0–0.2)

## 2023-12-20 LAB — TROPONIN I (HIGH SENSITIVITY): Troponin I (High Sensitivity): 4 ng/L (ref <18)

## 2023-12-20 MED ORDER — SODIUM CHLORIDE 0.9 % IV BOLUS
500.0000 mL | Freq: Once | INTRAVENOUS | Status: AC
Start: 2023-12-20 — End: 2023-12-20
  Administered 2023-12-20: 500 mL via INTRAVENOUS

## 2023-12-20 NOTE — ED Provider Notes (Signed)
 [facility] Provider Note    Event Date/Time   First MD Initiated Contact with Patient 12/20/23 1113     (approximate)   History   Near Syncope  Pt arrives via [facility] for near syncopal episode during church. EMS VS WNL. CBG 266 with EMS. Pt denies CP. Endorses DOE.    HPI Erza Mothershead is a 73 y.o. female PMH iron  deficiency anemia, T2DM, hyperlipidemia, hypertension, CKD, vertigo presents for evaluation of near syncopal episode while at church today - Patient had been feeling warm at church and felt lightheaded whenever she stood.  When attempting to leave church, stood up and started walking within her legs weak.  Family members behind her helped her sit down into a chair.  No loss of consciousness.  No chest pain or shortness of breath.  Currently asymptomatic.  Drinks minimal water at baseline, does usually have tea with breakfast and lunch.  No recent infectious symptoms including urinary. -No recent surgery/station/travel, no history of DVT/PE, no leg swelling   Per chart review, last echo 02/2022, unremarkable except for grade 1 diastolic dysfunction.  Normal EF.  No significant valvular pathology.      Physical Exam   Triage Vital Signs: ED Triage Vitals [12/20/23 1037]  Encounter Vitals Group     BP 130/85     Systolic BP Percentile      Diastolic BP Percentile      Pulse Rate 92     Resp 16     Temp 98 F (36.7 C)     Temp Source Oral     SpO2 97 %     Weight      Height      Head Circumference      Peak Flow      Pain Score 0     Pain Loc      Pain Education      Exclude from Growth Chart     Most recent vital signs: Vitals:   12/20/23 1130 12/20/23 1300  BP: (!) 108/52 (!) 127/55  Pulse: 81 81  Resp:  (!) 23  Temp:    SpO2: 98% 98%     General: Awake, no distress.  CV:  Good peripheral perfusion. RRR, RP 2+ Resp:  Normal effort. CTAB Abd:  No distention. Nontender to deep palpation throughout   ED Results /  Procedures / Treatments   Labs (all labs ordered are listed, but only abnormal results are displayed) Labs Reviewed  COMPREHENSIVE METABOLIC PANEL WITH GFR - Abnormal; Notable for the following components:      Result Value   CO2 19 (*)    Glucose, Bld 172 (*)    BUN 29 (*)    Creatinine, Ser 1.21 (*)    Calcium 8.8 (*)    Total Protein 6.4 (*)    Albumin 3.1 (*)    GFR, Estimated 48 (*)    All other components within normal limits  CBC - Abnormal; Notable for the following components:   RBC 3.45 (*)    Hemoglobin 9.7 (*)    HCT 30.3 (*)    All other components within normal limits  URINALYSIS, ROUTINE W REFLEX MICROSCOPIC - Abnormal; Notable for the following components:   Color, Urine YELLOW (*)    APPearance HAZY (*)    Ketones, ur 5 (*)    All other components within normal limits  CBG MONITORING, ED - Abnormal; Notable for the following components:   Glucose-Capillary 171 (*)  All other components within normal limits  TROPONIN I (HIGH SENSITIVITY)     EKG  See ED course.    RADIOLOGY N/a    PROCEDURES:  Critical Care performed: No  Procedures   MEDICATIONS ORDERED IN ED: Medications  sodium chloride  0.9 % bolus 500 mL (0 mLs Intravenous Stopped 12/20/23 1301)     IMPRESSION / MDM / ASSESSMENT AND PLAN / ED COURSE  I reviewed the triage vital signs and the nursing notes.                              DDX/MDM/AP: Differential diagnosis includes, but is not limited to, orthostasis, dehydration, consider transient arrhythmia, considered but doubt ACS.  Do not clinically suspect PE.  Plan: - Labs - Cardiac monitor - orthostatics - EKG - Reassess  Patient's presentation is most consistent with acute presentation with potential threat to life or bodily function.  The patient is on the cardiac monitor to evaluate for evidence of arrhythmia and/or significant heart rate changes.  ED course below.  Patient feeling better after IV fluid.   Orthostatic vital signs initially positive here.  Labs with evidence of mild dehydration.  Overall presentation consistent with orthostatic hypotension in the setting of dehydration.  Do not suspect cardiac etiology at this time.  Plan for PMD follow-up.  ED return precautions in place.  Patient agrees with plan.  Clinical Course as of 12/20/23 1327  Sun Dec 20, 2023  1158 CBC with no leukocytosis, anemia stable from prior [MM]  1158 CMP with mild AKI as well as mildly low bicarb, otherwise unremarkable [MM]  1158 Ecg = sinus rhythm, rate 83, no gross ST elevation, trace depression noted in lead II only.  Borderline left axis deviation.  Normal intervals.  No clear evidence of ischemia or arrhythmia on my read.  EKG overall similar from prior in 06/2023. [MM]  1231 Orthostatic VS positive, SBP drop of 20 from sitting to standing [MM]  1239 Trop wnl [MM]  1318 Patient reevaluated, feeling much better.  Able to ambulate with no significant dizziness.  Reiterates she had no chest pain, shortness of breath with episode.  Presentation overall consistent with orthostatic hypotension in the setting of likely dehydration.  Feeling better after IV fluid here.  Counseled on oral hydration.  Doubt transient arrhythmia but do recommend patient follow-up with her primary care provider for reevaluation.  Family is bedside, live next-door to patient, agree with plan and will monitor her closely at home.  ED return precautions in place. [MM]    Clinical Course User Index [MM] Collis Deaner, MD     FINAL CLINICAL IMPRESSION(S) / ED DIAGNOSES   Final diagnoses:  Near syncope  Orthostatic hypotension  Dehydration     Rx / DC Orders   ED Discharge Orders     None        Note:  This document was prepared using Dragon voice recognition software and may include unintentional dictation errors.   Collis Deaner, MD 12/20/23 [id number]

## 2023-12-20 NOTE — ED Triage Notes (Signed)
 Pt arrives via [facility] for near syncopal episode during church. EMS VS WNL. CBG 266 with EMS. Pt denies CP. Endorses DOE.

## 2023-12-20 NOTE — Discharge Instructions (Signed)
 Your evaluation in the emergency department was overall reassuring, and I do suspect you were somewhat dehydrated.  Please continue drink plenty of fluid daily and follow-up with your primary care provider.  Return to the emergency department with any new or worsening symptoms.

## 2023-12-20 NOTE — ED Notes (Signed)
 Patient Alert and oriented to baseline. Stable and ambulatory to baseline. Patient verbalized understanding of the discharge instructions.  Patient belongings were taken by the patient.

## 2023-12-22 ENCOUNTER — Inpatient Hospital Stay

## 2023-12-22 VITALS — BP 125/58 | HR 85 | Temp 97.0°F | Resp 18

## 2023-12-22 DIAGNOSIS — E538 Deficiency of other specified B group vitamins: Secondary | ICD-10-CM | POA: Diagnosis not present

## 2023-12-22 DIAGNOSIS — R9431 Abnormal electrocardiogram [ECG] [EKG]: Secondary | ICD-10-CM | POA: Diagnosis not present

## 2023-12-22 DIAGNOSIS — D508 Other iron deficiency anemias: Secondary | ICD-10-CM

## 2023-12-22 DIAGNOSIS — D509 Iron deficiency anemia, unspecified: Secondary | ICD-10-CM | POA: Diagnosis not present

## 2023-12-22 DIAGNOSIS — Z853 Personal history of malignant neoplasm of breast: Secondary | ICD-10-CM | POA: Diagnosis not present

## 2023-12-22 MED ORDER — IRON SUCROSE 20 MG/ML IV SOLN
200.0000 mg | Freq: Once | INTRAVENOUS | Status: AC
  Administered 2023-12-22: 200 mg via INTRAVENOUS
  Filled 2023-12-22: qty 10

## 2023-12-22 NOTE — Patient Instructions (Signed)
 Iron Sucrose Injection What is this medication? IRON SUCROSE (EYE ern SOO krose) treats low levels of iron (iron deficiency anemia) in people with kidney disease. Iron is a mineral that plays an important role in making red blood cells, which carry oxygen from your lungs to the rest of your body. This medicine may be used for other purposes; ask your health care provider or pharmacist if you have questions. COMMON BRAND NAME(S): Venofer What should I tell my care team before I take this medication? They need to know if you have any of these conditions: Anemia not caused by low iron levels Heart disease High levels of iron in the blood Kidney disease Liver disease An unusual or allergic reaction to iron, other medications, foods, dyes, or preservatives Pregnant or trying to get pregnant Breastfeeding How should I use this medication? This medication is for infusion into a vein. It is given in a hospital or clinic setting. Talk to your care team about the use of this medication in children. While this medication may be prescribed for children as young as 2 years for selected conditions, precautions do apply. Overdosage: If you think you have taken too much of this medicine contact a poison control center or emergency room at once. NOTE: This medicine is only for you. Do not share this medicine with others. What if I miss a dose? Keep appointments for follow-up doses. It is important not to miss your dose. Call your care team if you are unable to keep an appointment. What may interact with this medication? Do not take this medication with any of the following: Deferoxamine Dimercaprol Other iron products This medication may also interact with the following: Chloramphenicol Deferasirox This list may not describe all possible interactions. Give your health care provider a list of all the medicines, herbs, non-prescription drugs, or dietary supplements you use. Also tell them if you smoke,  drink alcohol, or use illegal drugs. Some items may interact with your medicine. What should I watch for while using this medication? Visit your care team regularly. Tell your care team if your symptoms do not start to get better or if they get worse. You may need blood work done while you are taking this medication. You may need to follow a special diet. Talk to your care team. Foods that contain iron include: whole grains/cereals, dried fruits, beans, or peas, leafy green vegetables, and organ meats (liver, kidney). What side effects may I notice from receiving this medication? Side effects that you should report to your care team as soon as possible: Allergic reactions--skin rash, itching, hives, swelling of the face, lips, tongue, or throat Low blood pressure--dizziness, feeling faint or lightheaded, blurry vision Shortness of breath Side effects that usually do not require medical attention (report to your care team if they continue or are bothersome): Flushing Headache Joint pain Muscle pain Nausea Pain, redness, or irritation at injection site This list may not describe all possible side effects. Call your doctor for medical advice about side effects. You may report side effects to FDA at 1-800-FDA-1088. Where should I keep my medication? This medication is given in a hospital or clinic. It will not be stored at home. NOTE: This sheet is a summary. It may not cover all possible information. If you have questions about this medicine, talk to your doctor, pharmacist, or health care provider.  2024 Elsevier/Gold Standard (2022-12-05 00:00:00)

## 2023-12-29 ENCOUNTER — Inpatient Hospital Stay

## 2023-12-29 VITALS — BP 138/67 | HR 82 | Temp 97.2°F | Resp 18

## 2023-12-29 DIAGNOSIS — D508 Other iron deficiency anemias: Secondary | ICD-10-CM

## 2023-12-29 DIAGNOSIS — E538 Deficiency of other specified B group vitamins: Secondary | ICD-10-CM | POA: Diagnosis not present

## 2023-12-29 DIAGNOSIS — D509 Iron deficiency anemia, unspecified: Secondary | ICD-10-CM | POA: Diagnosis not present

## 2023-12-29 DIAGNOSIS — R9431 Abnormal electrocardiogram [ECG] [EKG]: Secondary | ICD-10-CM | POA: Diagnosis not present

## 2023-12-29 DIAGNOSIS — Z853 Personal history of malignant neoplasm of breast: Secondary | ICD-10-CM | POA: Diagnosis not present

## 2023-12-29 MED ORDER — IRON SUCROSE 20 MG/ML IV SOLN
200.0000 mg | Freq: Once | INTRAVENOUS | Status: AC
  Administered 2023-12-29: 200 mg via INTRAVENOUS
  Filled 2023-12-29: qty 10

## 2024-01-01 ENCOUNTER — Encounter: Payer: Self-pay | Admitting: Oncology

## 2024-01-04 ENCOUNTER — Other Ambulatory Visit: Payer: Self-pay | Admitting: Family Medicine

## 2024-01-04 ENCOUNTER — Telehealth: Payer: Self-pay | Admitting: *Deleted

## 2024-01-04 MED ORDER — TIRZEPATIDE 5 MG/0.5ML ~~LOC~~ SOAJ: 5.0000 mg | SUBCUTANEOUS | 0 refills | Status: DC

## 2024-01-04 NOTE — Telephone Encounter (Signed)
 I sent the 5 mg to start after she is done with the 2.5 mg  Let us  know if any problems

## 2024-01-04 NOTE — Telephone Encounter (Signed)
 Pt notified of Dr. Graham comments. Pt is okay with PCP sending in the 5 mg next dose.  [facility] [address]

## 2024-01-04 NOTE — Telephone Encounter (Signed)
 Good!  Taking generic moujaro 2.5 mg weekly  Next step after a month of this will be to go up to 5 mg   I can start process of sending that if she wants me to  What pharmacy?

## 2024-01-04 NOTE — Telephone Encounter (Signed)
 Copied from CRM [id number]. Topic: Clinical - Medical Advice >> Jan 04, 2024  8:11 AM Carlatta H wrote: Reason for CRM: Patient called to advise that she has had not side effects from new diabetic medication that she has been taking for 2 weeks

## 2024-01-05 ENCOUNTER — Inpatient Hospital Stay

## 2024-01-05 VITALS — BP 137/60 | HR 84 | Temp 97.2°F | Resp 18

## 2024-01-05 DIAGNOSIS — Z853 Personal history of malignant neoplasm of breast: Secondary | ICD-10-CM | POA: Diagnosis not present

## 2024-01-05 DIAGNOSIS — D509 Iron deficiency anemia, unspecified: Secondary | ICD-10-CM | POA: Diagnosis not present

## 2024-01-05 DIAGNOSIS — D508 Other iron deficiency anemias: Secondary | ICD-10-CM

## 2024-01-05 DIAGNOSIS — E538 Deficiency of other specified B group vitamins: Secondary | ICD-10-CM | POA: Diagnosis not present

## 2024-01-05 DIAGNOSIS — R9431 Abnormal electrocardiogram [ECG] [EKG]: Secondary | ICD-10-CM | POA: Diagnosis not present

## 2024-01-05 MED ORDER — IRON SUCROSE 20 MG/ML IV SOLN
200.0000 mg | Freq: Once | INTRAVENOUS | Status: AC
  Administered 2024-01-05: 200 mg via INTRAVENOUS

## 2024-01-05 NOTE — Patient Instructions (Signed)

## 2024-01-12 ENCOUNTER — Inpatient Hospital Stay: Attending: Internal Medicine

## 2024-01-12 VITALS — BP 137/56 | HR 86 | Temp 98.2°F | Resp 16

## 2024-01-12 DIAGNOSIS — D509 Iron deficiency anemia, unspecified: Secondary | ICD-10-CM | POA: Insufficient documentation

## 2024-01-12 DIAGNOSIS — D508 Other iron deficiency anemias: Secondary | ICD-10-CM

## 2024-01-12 MED ORDER — IRON SUCROSE 20 MG/ML IV SOLN
200.0000 mg | Freq: Once | INTRAVENOUS | Status: AC
  Administered 2024-01-12: 200 mg via INTRAVENOUS
  Filled 2024-01-12: qty 10

## 2024-01-13 ENCOUNTER — Ambulatory Visit: Admitting: Internal Medicine

## 2024-01-13 ENCOUNTER — Encounter: Payer: Self-pay | Admitting: Internal Medicine

## 2024-01-13 ENCOUNTER — Ambulatory Visit: Payer: Self-pay | Admitting: Internal Medicine

## 2024-01-13 ENCOUNTER — Other Ambulatory Visit (INDEPENDENT_AMBULATORY_CARE_PROVIDER_SITE_OTHER)

## 2024-01-13 VITALS — BP 136/72 | HR 85 | Ht 59.0 in | Wt 175.0 lb

## 2024-01-13 DIAGNOSIS — D509 Iron deficiency anemia, unspecified: Secondary | ICD-10-CM | POA: Diagnosis not present

## 2024-01-13 DIAGNOSIS — R1310 Dysphagia, unspecified: Secondary | ICD-10-CM | POA: Diagnosis not present

## 2024-01-13 DIAGNOSIS — D649 Anemia, unspecified: Secondary | ICD-10-CM

## 2024-01-13 DIAGNOSIS — Z860101 Personal history of adenomatous and serrated colon polyps: Secondary | ICD-10-CM

## 2024-01-13 DIAGNOSIS — K219 Gastro-esophageal reflux disease without esophagitis: Secondary | ICD-10-CM | POA: Diagnosis not present

## 2024-01-13 DIAGNOSIS — E669 Obesity, unspecified: Secondary | ICD-10-CM

## 2024-01-13 LAB — CBC WITH DIFFERENTIAL/PLATELET
Basophils Absolute: 0.1 10*3/uL (ref 0.0–0.1)
Basophils Relative: 1.2 % (ref 0.0–3.0)
Eosinophils Absolute: 0.5 10*3/uL (ref 0.0–0.7)
Eosinophils Relative: 8.8 % — ABNORMAL HIGH (ref 0.0–5.0)
HCT: 31.9 % — ABNORMAL LOW (ref 36.0–46.0)
Hemoglobin: 10.7 g/dL — ABNORMAL LOW (ref 12.0–15.0)
Lymphocytes Relative: 20 % (ref 12.0–46.0)
Lymphs Abs: 1.2 10*3/uL (ref 0.7–4.0)
MCHC: 33.6 g/dL (ref 30.0–36.0)
MCV: 87.1 fl (ref 78.0–100.0)
Monocytes Absolute: 0.5 10*3/uL (ref 0.1–1.0)
Monocytes Relative: 7.5 % (ref 3.0–12.0)
Neutro Abs: 3.8 10*3/uL (ref 1.4–7.7)
Neutrophils Relative %: 62.5 % (ref 43.0–77.0)
Platelets: 354 10*3/uL (ref 150.0–400.0)
RBC: 3.67 Mil/uL — ABNORMAL LOW (ref 3.87–5.11)
RDW: 19.5 % — ABNORMAL HIGH (ref 11.5–15.5)
WBC: 6.1 10*3/uL (ref 4.0–10.5)

## 2024-01-13 LAB — FERRITIN: Ferritin: 313.8 ng/mL — ABNORMAL HIGH (ref 10.0–291.0)

## 2024-01-13 NOTE — Progress Notes (Signed)
 HISTORY OF PRESENT ILLNESS:  Martha Hamilton is a 72 y.o. female who is sent today by hematology regarding recurrent iron  deficiency anemia.  She has been evaluated in this office for the same.  She also has a history of GERD with prior fundoplication.  She is accompanied today by Martha Hamilton December 2024 was 12.8.  Hemoglobin Dec 11, 2023 was 9.4 with MCV 87.  Iron , B12, and folate studies at that time were normal.  She was placed on iron  supplementation which she did not tolerate.  Hemoglobin December 20, 2023 was 9.7.  MCV 88.  She has received 5 iron  infusions.  She denies melena or hematochezia.  Hemoccult testing last year was negative.  Most recent GI evaluations are as follows:  1.  Capsule endoscopy May 21, 2021.  Complete study with adequate preparation was normal. 2.  Upper endoscopy with biopsies.  May 01, 2021.  Prior fundoplication.  Otherwise normal.  Negative duodenal biopsies. 3.  Colonoscopy with cold snare polypectomy x 2.  May 01, 2021.  Normal terminal ileum.  Diverticulosis and hemorrhoids in addition to diminutive polyps.  Follow-up in 7 years recommended.  Patient does notice fatigue with her anemia.  She has had some nausea.  She also reports significant dysphagia to solid foods and pills.  She takes omeprazole  daily for GERD  REVIEW OF SYSTEMS:  All non-GI ROS negative unless otherwise stated in the HPI except for arthritis, back pain, muscle cramps, sore throat, fatigue  Past Medical History:  Diagnosis Date   Allergic rhinitis    Allergy    Asthma    Breast cancer [facility]) 1996   right breast   Cataract Oct. 2021   Depression 1981   DM2 (diabetes mellitus, type 2) (HCC)    GERD (gastroesophageal reflux disease)    Hx of cardiovascular stress test    a. Lex MV 2/14:  EF 69%, no ischemia   Hyperlipidemia    Hypertension    IDA (iron  deficiency anemia) 08/27/2020   Iron  deficiency anemia    Obesity    Osteoarthritis    hands   Thyroid  nodule  06/29/2023   Ulcer     Past Surgical History:  Procedure Laterality Date   APPENDECTOMY  1976   BREAST LUMPECTOMY Right 1996   right breast, lumpectomy w/nodes   CATARACT EXTRACTION W/PHACO Left 11/28/2020   Procedure: CATARACT EXTRACTION PHACO AND INTRAOCULAR LENS PLACEMENT (IOC) LEFT DIABETIC;  Surgeon: Mittie Gaskin, MD;  Location: [facility];  Service: Ophthalmology;  Laterality: Left;  5.02 01:07.4 7.4%   CATARACT EXTRACTION W/PHACO Right 12/12/2020   Procedure: CATARACT EXTRACTION PHACO AND INTRAOCULAR LENS PLACEMENT (IOC) RIGHT DIABETIC 5.22 00:59.2;  Surgeon: Mittie Gaskin, MD;  Location: [facility];  Service: Ophthalmology;  Laterality: Right;   CHONDROPLASTY Left 02/10/2018   Procedure: CHONDROPLASTY;  Surgeon: Leora Lynwood SAUNDERS, MD;  Location: [facility];  Service: Orthopedics;  Laterality: Left;   EYE SURGERY     FOOT SURGERY Left 07/2022   bone removal   GASTRIC RESTRICTION SURGERY     for reflux   HYSTEROSCOPY WITH D & C N/A 10/18/2012   Procedure: DILATATION AND CURETTAGE /HYSTEROSCOPY;  Surgeon: Winton Felt, MD;  Location: [facility];  Service: Gynecology;  Laterality: N/A;   KNEE ARTHROSCOPY WITH MEDIAL MENISECTOMY Left 02/10/2018   Procedure: KNEE ARTHROSCOPY WITH MEDIAL and LATERAL MENISECTOMY;  Surgeon: Leora Lynwood SAUNDERS, MD;  Location: [facility];  Service: Orthopedics;  Laterality: Left;   POLYPECTOMY N/A 10/18/2012  Procedure: POLYPECTOMY;  Surgeon: Winton Felt, MD;  Location: [facility];  Service: Gynecology;  Laterality: N/A;   SYNOVECTOMY Left 02/10/2018   Procedure: SYNOVECTOMY;  Surgeon: Leora Lynwood SAUNDERS, MD;  Location: [facility];  Service: Orthopedics;  Laterality: Left;    Social History Martha Hamilton  reports that she has never smoked. She has never used smokeless tobacco. She reports that she does not drink alcohol and does not use drugs.  family history includes CAD (age of onset: 77) in her father; Cancer in her brother and  mother; Diabetes in her brother, mother, and sister; Esophageal cancer in her brother; Heart disease in her father, mother, and sister; Hypertension in her mother; Kidney cancer in her mother; Stroke in her father.  Allergies  Allergen Reactions   Cholestyramine Other (See Comments)    REACTION: Muscles tightened up, drew up.   Simvastatin  Other (See Comments)    Muscular pain   Lipitor [Atorvastatin] Other (See Comments)    Muscle cramping   Niacin Nausea And Vomiting       PHYSICAL EXAMINATION: Vital signs: BP 136/72   Pulse 85   Ht 4' 11 (1.499 m)   Wt 175 lb (79.4 kg)   LMP 06/13/2001   BMI 35.35 kg/m   Constitutional: Pleasant, overweight, somewhat unhealthy appearing, no acute distress Psychiatric: alert and oriented x3, cooperative Eyes: extraocular movements intact, anicteric, conjunctiva pink Mouth: oral pharynx moist, no lesions Neck: supple no lymphadenopathy Cardiovascular: heart regular rate and rhythm, no murmur Lungs: clear to auscultation bilaterally Abdomen: soft, nontender, nondistended, no obvious ascites, no peritoneal signs, normal bowel sounds, no organomegaly Rectal: Omitted Extremities: no clubbing, cyanosis, or lower extremity edema bilaterally Skin: no lesions on visible extremities Neuro: No focal deficits.   ASSESSMENT:  1.  Recurrent anemia.  Possibly iron  deficiency.  Prior extensive GI workup as described 2.  GERD 3.  Prior fundoplication 4.  Intermittent solid food dysphagia 5.  History of adenomatous colon polyps.  Surveillance up-to-date   PLAN:  1.  Check CBC and ferritin today 2.  Schedule upper endoscopy with esophageal dilation.The nature of the procedure, as well as the risks, benefits, and alternatives were carefully and thoroughly reviewed with the patient. Ample time for discussion and questions allowed. The patient understood, was satisfied, and agreed to proceed. 3.  Ongoing assessment, monitoring and management of anemia  per hematology 7.  Surveillance colonoscopy around 2029 A total time of 45 minutes was spent preparing to see the patient, reviewing a myriad of data, obtaining comprehensive history, performing medically appropriate physical exam, counseling and educating the patient and her family regarding the above listed issues, ordering blood work, ordering therapeutic endoscopic procedure, and documenting clinical information in the health record

## 2024-01-13 NOTE — Patient Instructions (Signed)
 Your provider has requested that you go to the basement level for lab work before leaving today. Press B on the elevator. The lab is located at the first door on the left as you exit the elevator.    You have been scheduled for an endoscopy. Please follow written instructions given to you at your visit today.  If you use inhalers (even only as needed), please bring them with you on the day of your procedure.  If you take any of the following medications, they will need to be adjusted prior to your procedure:   DO NOT TAKE 7 DAYS PRIOR TO TEST- Trulicity (dulaglutide) Ozempic , Wegovy  (semaglutide ) Mounjaro  (tirzepatide ) Bydureon Bcise (exanatide extended release)  DO NOT TAKE 1 DAY PRIOR TO YOUR TEST Rybelsus  (semaglutide ) Adlyxin (lixisenatide) Victoza (liraglutide) Byetta (exanatide) ___________________________________________________________________________  Due to recent changes in healthcare laws, you may see the results of your imaging and laboratory studies on MyChart before your provider has had a chance to review them.  We understand that in some cases there may be results that are confusing or concerning to you. Not all laboratory results come back in the same time frame and the provider may be waiting for multiple results in order to interpret others.  Please give us  48 hours in order for your provider to thoroughly review all the results before contacting the office for clarification of your results.   Thank you for choosing me and [facility].  Dr Norleen Kiang

## 2024-01-31 ENCOUNTER — Telehealth: Payer: Self-pay | Admitting: Family Medicine

## 2024-01-31 DIAGNOSIS — N1832 Chronic kidney disease, stage 3b: Secondary | ICD-10-CM

## 2024-01-31 DIAGNOSIS — I10 Essential (primary) hypertension: Secondary | ICD-10-CM

## 2024-01-31 DIAGNOSIS — E1169 Type 2 diabetes mellitus with other specified complication: Secondary | ICD-10-CM

## 2024-01-31 DIAGNOSIS — E119 Type 2 diabetes mellitus without complications: Secondary | ICD-10-CM

## 2024-01-31 DIAGNOSIS — E538 Deficiency of other specified B group vitamins: Secondary | ICD-10-CM

## 2024-01-31 NOTE — Telephone Encounter (Signed)
-----   Message from Veva JINNY Ferrari sent at 01/12/2024  3:31 PM EDT ----- Regarding: Lab orders for Mon, 7.21.25 Patient is scheduled for CPX labs, please order future labs, Thanks , Veva

## 2024-02-01 ENCOUNTER — Ambulatory Visit: Payer: Self-pay | Admitting: Family Medicine

## 2024-02-01 ENCOUNTER — Other Ambulatory Visit (INDEPENDENT_AMBULATORY_CARE_PROVIDER_SITE_OTHER): Payer: Medicare PPO

## 2024-02-01 DIAGNOSIS — E785 Hyperlipidemia, unspecified: Secondary | ICD-10-CM | POA: Diagnosis not present

## 2024-02-01 DIAGNOSIS — E1169 Type 2 diabetes mellitus with other specified complication: Secondary | ICD-10-CM | POA: Diagnosis not present

## 2024-02-01 DIAGNOSIS — M1712 Unilateral primary osteoarthritis, left knee: Secondary | ICD-10-CM | POA: Diagnosis not present

## 2024-02-01 DIAGNOSIS — E538 Deficiency of other specified B group vitamins: Secondary | ICD-10-CM

## 2024-02-01 DIAGNOSIS — E119 Type 2 diabetes mellitus without complications: Secondary | ICD-10-CM

## 2024-02-01 DIAGNOSIS — I10 Essential (primary) hypertension: Secondary | ICD-10-CM

## 2024-02-01 DIAGNOSIS — N1832 Chronic kidney disease, stage 3b: Secondary | ICD-10-CM

## 2024-02-01 LAB — LIPID PANEL
Cholesterol: 143 mg/dL (ref 0–200)
HDL: 16.9 mg/dL — ABNORMAL LOW (ref >39.00)
NonHDL: 126.29
Total CHOL/HDL Ratio: 8
Triglycerides: 559 mg/dL — ABNORMAL HIGH (ref 0.0–149.0)
VLDL: 111.8 mg/dL — ABNORMAL HIGH (ref 0.0–40.0)

## 2024-02-01 LAB — CBC WITH DIFFERENTIAL/PLATELET
Basophils Absolute: 0.1 K/uL (ref 0.0–0.1)
Basophils Relative: 1.6 % (ref 0.0–3.0)
Eosinophils Absolute: 0.4 K/uL (ref 0.0–0.7)
Eosinophils Relative: 9 % — ABNORMAL HIGH (ref 0.0–5.0)
HCT: 32.9 % — ABNORMAL LOW (ref 36.0–46.0)
Hemoglobin: 11 g/dL — ABNORMAL LOW (ref 12.0–15.0)
Lymphocytes Relative: 30 % (ref 12.0–46.0)
Lymphs Abs: 1.3 K/uL (ref 0.7–4.0)
MCHC: 33.4 g/dL (ref 30.0–36.0)
MCV: 88.5 fl (ref 78.0–100.0)
Monocytes Absolute: 0.4 K/uL (ref 0.1–1.0)
Monocytes Relative: 8.6 % (ref 3.0–12.0)
Neutro Abs: 2.2 K/uL (ref 1.4–7.7)
Neutrophils Relative %: 50.8 % (ref 43.0–77.0)
Platelets: 348 K/uL (ref 150.0–400.0)
RBC: 3.72 Mil/uL — ABNORMAL LOW (ref 3.87–5.11)
RDW: 18.1 % — ABNORMAL HIGH (ref 11.5–15.5)
WBC: 4.4 K/uL (ref 4.0–10.5)

## 2024-02-01 LAB — TSH: TSH: 0.92 u[IU]/mL (ref 0.35–5.50)

## 2024-02-01 LAB — COMPREHENSIVE METABOLIC PANEL WITH GFR
ALT: 11 U/L (ref 0–35)
AST: 16 U/L (ref 0–37)
Albumin: 3.6 g/dL (ref 3.5–5.2)
Alkaline Phosphatase: 48 U/L (ref 39–117)
BUN: 23 mg/dL (ref 6–23)
CO2: 24 meq/L (ref 19–32)
Calcium: 9.4 mg/dL (ref 8.4–10.5)
Chloride: 100 meq/L (ref 96–112)
Creatinine, Ser: 0.94 mg/dL (ref 0.40–1.20)
GFR: 60.4 mL/min (ref >60.00)
Glucose, Bld: 126 mg/dL — ABNORMAL HIGH (ref 70–99)
Potassium: 4.6 meq/L (ref 3.5–5.1)
Sodium: 137 meq/L (ref 135–145)
Total Bilirubin: 0.3 mg/dL (ref 0.2–1.2)
Total Protein: 6.4 g/dL (ref 6.0–8.3)

## 2024-02-01 LAB — VITAMIN D 25 HYDROXY (VIT D DEFICIENCY, FRACTURES): VITD: 66.59 ng/mL (ref 30.00–100.00)

## 2024-02-01 LAB — LDL CHOLESTEROL, DIRECT: Direct LDL: 37 mg/dL

## 2024-02-01 LAB — HEMOGLOBIN A1C: Hgb A1c MFr Bld: 5.9 % (ref 4.6–6.5)

## 2024-02-01 LAB — VITAMIN B12: Vitamin B-12: >1500 pg/mL — ABNORMAL HIGH (ref 211–911)

## 2024-02-03 DIAGNOSIS — M5412 Radiculopathy, cervical region: Secondary | ICD-10-CM | POA: Diagnosis not present

## 2024-02-03 DIAGNOSIS — M4802 Spinal stenosis, cervical region: Secondary | ICD-10-CM | POA: Diagnosis not present

## 2024-02-04 ENCOUNTER — Other Ambulatory Visit: Payer: Self-pay | Admitting: Family Medicine

## 2024-02-04 ENCOUNTER — Ambulatory Visit: Payer: Medicare PPO | Admitting: Family Medicine

## 2024-02-04 ENCOUNTER — Encounter: Payer: Self-pay | Admitting: Family Medicine

## 2024-02-04 VITALS — BP 120/60 | HR 99 | Temp 97.9°F | Ht 59.5 in | Wt 173.0 lb

## 2024-02-04 DIAGNOSIS — I10 Essential (primary) hypertension: Secondary | ICD-10-CM | POA: Diagnosis not present

## 2024-02-04 DIAGNOSIS — N1832 Chronic kidney disease, stage 3b: Secondary | ICD-10-CM | POA: Diagnosis not present

## 2024-02-04 DIAGNOSIS — D473 Essential (hemorrhagic) thrombocythemia: Secondary | ICD-10-CM | POA: Diagnosis not present

## 2024-02-04 DIAGNOSIS — K760 Fatty (change of) liver, not elsewhere classified: Secondary | ICD-10-CM

## 2024-02-04 DIAGNOSIS — Z0000 Encounter for general adult medical examination without abnormal findings: Secondary | ICD-10-CM | POA: Diagnosis not present

## 2024-02-04 DIAGNOSIS — K219 Gastro-esophageal reflux disease without esophagitis: Secondary | ICD-10-CM

## 2024-02-04 DIAGNOSIS — Z7984 Long term (current) use of oral hypoglycemic drugs: Secondary | ICD-10-CM | POA: Diagnosis not present

## 2024-02-04 DIAGNOSIS — E1169 Type 2 diabetes mellitus with other specified complication: Secondary | ICD-10-CM

## 2024-02-04 DIAGNOSIS — E785 Hyperlipidemia, unspecified: Secondary | ICD-10-CM

## 2024-02-04 DIAGNOSIS — E119 Type 2 diabetes mellitus without complications: Secondary | ICD-10-CM

## 2024-02-04 DIAGNOSIS — G720 Drug-induced myopathy: Secondary | ICD-10-CM

## 2024-02-04 DIAGNOSIS — E669 Obesity, unspecified: Secondary | ICD-10-CM

## 2024-02-04 DIAGNOSIS — T466X5A Adverse effect of antihyperlipidemic and antiarteriosclerotic drugs, initial encounter: Secondary | ICD-10-CM

## 2024-02-04 DIAGNOSIS — E538 Deficiency of other specified B group vitamins: Secondary | ICD-10-CM

## 2024-02-04 MED ORDER — TIRZEPATIDE 7.5 MG/0.5ML ~~LOC~~ SOAJ
7.5000 mg | SUBCUTANEOUS | 0 refills | Status: DC
Start: 2024-02-04 — End: 2024-03-16

## 2024-02-04 NOTE — Assessment & Plan Note (Signed)
 Lab Results  Component Value Date   VITAMINB12 >1500 (H) 02/01/2024   Holding b12 per heme now

## 2024-02-04 NOTE — Assessment & Plan Note (Signed)
 GFR is mproved at 60.4 Commended good water intake

## 2024-02-04 NOTE — Patient Instructions (Addendum)
 Stay active  Add some strength training to your routine, this is important for bone and brain health and can reduce your risk of falls and help your body use insulin properly and regulate weight  Light weights, exercise bands , and internet videos are a good way to start  Yoga (chair or regular), machines , floor exercises or a gym with machines are also good options    If using weights-gradually go up on the weight   Keep up your protein intake (depending on what you can swallow)  The following are examples of protein in diet  Meat  Fish  Eggs  Dairy products  Soy products  Oat milk  Almond milk Legumes  Nuts and nut butters  Dried beans    Once you can swallow better - you should be able to eat better also   Continue to avoid added sugar and fatty foods   Keep talking to your cardiologist about the cholesterol

## 2024-02-04 NOTE — Progress Notes (Signed)
 Subjective:    Patient ID: Martha Hamilton, female    DOB: [birth year], 73 y.o.   MRN: [id number]  HPI  Here for health maintenance exam and to review chronic medical problems   Wt Readings from Last 3 Encounters:  02/04/24 173 lb (78.5 kg)  01/13/24 175 lb (79.4 kg)  12/15/23 176 lb 6.4 oz (80 kg)   34.36 kg/m  Vitals:   02/04/24 0856  BP: 120/60  Pulse: 99  Temp: 97.9 F (36.6 C)  SpO2: 94%    Immunization History  Administered Date(s) Administered   Fluad Quad(high Dose 65+) 03/10/2019, 03/29/2020, 03/20/2022   Fluad Trivalent(High Dose 65+) 03/26/2023   Influenza Split 04/09/2011, 04/01/2012   Influenza Whole 04/23/2007, 04/10/2008, 04/10/2009, 04/02/2010   Influenza,inj,Quad PF,6+ Mos 03/23/2013, 03/29/2014, 03/13/2015, 04/10/2016, 03/25/2017, 04/08/2018   PFIZER(Purple Top)SARS-COV-2 Vaccination 09/05/2019, 09/26/2019, 05/26/2020   Pneumococcal Conjugate-13 02/08/2016   Pneumococcal Polysaccharide-23 04/13/2008, 05/16/2013, 02/26/2017   Td 02/09/2009   Zoster Recombinant(Shingrix) 09/25/2021, 01/06/2022   Zoster, Live 01/28/2011    Health Maintenance Due  Topic Date Due   FOOT EXAM  09/18/2022     Mammogram 06/2023 Self breast exam- no lumps   Gyn health-no problems    Colon cancer screening -colonoscopy 04/2021   Bone health  Dexa  04/2020 , normal bmd  Falls- none  Fractures-none  Supplements - daily vitamin D   Last vitamin D  Lab Results  Component Value Date   VD25OH 66.59 02/01/2024    Exercise  Not much due to knees  Sitting program- uses 3 lb weights sitting   Will be getting another gel shot in knee from Dr Leora soon    Mood    02/04/2024    9:36 AM 01/12/2024    1:35 PM 01/05/2024    1:23 PM 11/03/2023    2:05 PM 05/06/2023    9:59 AM  Depression screen PHQ 2/9  Decreased Interest 0 0 0 3 2  Down, Depressed, Hopeless 0 0 0 3 1  PHQ - 2 Score 0 0 0 6 3  Altered sleeping 1   0 0  Tired, decreased energy 2   2 3   Change  in appetite 2   2 2   Feeling bad or failure about yourself  1   3 1   Trouble concentrating 0   0 0  Moving slowly or fidgety/restless 2   0 3  Suicidal thoughts 0   0 0  PHQ-9 Score 8   13 12   Difficult doing work/chores Somewhat difficult    Somewhat difficult   Some fatigue  Occational anxious   HTN bp is stable today  No cp or palpitations or headaches or edema  No side effects to medicines  BP Readings from Last 3 Encounters:  02/04/24 120/60  01/13/24 136/72  01/12/24 (!) 137/56   Losartan  50 mg bid Metoprolol  xl 100 mg daily  Amlodipine  5 mg daily   Pulse Readings from Last 3 Encounters:  02/04/24 99  01/13/24 85  01/12/24 86      Lab Results  Component Value Date   NA 137 02/01/2024   K 4.6 02/01/2024   CO2 24 02/01/2024   GLUCOSE 126 (H) 02/01/2024   BUN 23 02/01/2024   CREATININE 0.94 02/01/2024   CALCIUM 9.4 02/01/2024   GFR 60.40 02/01/2024   GFRNONAA 48 (L) 12/20/2023   Is drinking fluids  GFR improved   History of fatty liver Lab Results  Component Value Date   ALT 11 02/01/2024  AST 16 02/01/2024   ALKPHOS 48 02/01/2024   BILITOT 0.3 02/01/2024    GERD Omeprazole  40 mg  More trouble swallwoing again-has EGD with dilatation scheduled next month  Sees hematology B12 def  Lab Results  Component Value Date   VITAMINB12 >1500 (H) 02/01/2024  Dr Babara decreased her B12- holding pill   Anemia Sees heme Lab Results  Component Value Date   WBC 4.4 02/01/2024   HGB 11.0 (L) 02/01/2024   HCT 32.9 (L) 02/01/2024   MCV 88.5 02/01/2024   PLT 348.0 02/01/2024   Has had IV iron      DM2 Lab Results  Component Value Date   HGBA1C 5.9 02/01/2024   HGBA1C 6.4 (A) 10/21/2023   HGBA1C 6.0 (A) 05/06/2023    Metformin  500 mg bid  Mounjaro  5 mg weekly - is doing great with that  Next week will be last dose , wants to go up on dose  No longer on glipizide    Eating is fair  Needs esoph dilatation before she can swallow well   Is eating  lots of veggies   Lab Results  Component Value Date   MICROALBUR 2.0 (H) 10/21/2023   MICROALBUR 1.1 04/29/2010   Ratio 12.4   Hyperlipidemia Lab Results  Component Value Date   CHOL 143 02/01/2024   CHOL 148 03/06/2023   CHOL 145 01/19/2023   Lab Results  Component Value Date   HDL 16.90 (L) 02/01/2024   HDL 39 (L) 03/06/2023   HDL 35.00 (L) 01/19/2023   Lab Results  Component Value Date   LDLCALC 47 03/06/2023   LDLCALC 31 05/29/2022   LDLCALC 51 10/21/2011   Lab Results  Component Value Date   TRIG (H) 02/01/2024    559.0 Triglyceride is over 400; calculations on Lipids are invalid.   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   Muscle cramps 02/28/2022   Neck pain 02/28/2022   Strain of flexor tendon of wrist 12/02/2021   Tendinitis of left wrist 12/02/2021   Left wrist pain 11/13/2021   Family history of cancer 08/27/2020   History of breast cancer 08/27/2020   IDA (iron  deficiency anemia) 08/27/2020   B12 deficiency 08/27/2020   Chronic kidney disease (CKD) stage G3b/A1, moderately decreased glomerular filtration rate (GFR) between 30-44 mL/min/1.73 square meter and albuminuria creatinine ratio less than 30 mg/g (HCC) 07/24/2020   Tachycardia 01/11/2019   Fatty liver 06/21/2018   Obesity (BMI 30-39.9) 06/21/2018   GERD (gastroesophageal reflux disease) 05/25/2018   H/O vertigo 05/11/2018   Essential  (hemorrhagic) thrombocythemia (HCC) 12/25/2017   Tear of medial meniscus of knee 12/21/2017   Pain in left knee 12/21/2017   Left knee pain 09/21/2017   Grief reaction 12/17/2016   Estrogen deficiency 09/08/2016   Myalgia 02/08/2016   Normocytic anemia 02/09/2013   Abnormal EKG 07/27/2012   Special  screening for malignant neoplasms, colon 04/29/2011   Encounter for gynecological examination 04/29/2011   Routine general medical examination at a health care facility 04/20/2011   Osteoarthrosis, hand 10/31/2008   Diabetes type 2, controlled (HCC) 07/10/2008   Hyperlipidemia associated with type 2 diabetes mellitus (HCC) 01/26/2008   Essential hypertension 01/26/2008   Allergic rhinitis 12/07/2007   Past Medical History:  Diagnosis Date   Allergic rhinitis    Allergy    Asthma    Breast cancer [facility] 1996   right breast   Cataract Oct. 2021   Depression 1981   DM2 (diabetes mellitus, type 2) (HCC)    GERD (gastroesophageal reflux disease)    Hx of cardiovascular stress test    a. Lex MV 2/14:  EF 69%, no ischemia   Hyperlipidemia    Hypertension    IDA (iron  deficiency anemia) 08/27/2020   Iron  deficiency anemia    Obesity    Osteoarthritis    hands   Thyroid  nodule 06/29/2023   Ulcer    Past Surgical History:  Procedure Laterality Date   APPENDECTOMY  1976   BREAST LUMPECTOMY Right 1996   right breast, lumpectomy w/nodes   CATARACT EXTRACTION W/PHACO Left 11/28/2020   Procedure: CATARACT EXTRACTION PHACO AND INTRAOCULAR LENS PLACEMENT (IOC) LEFT DIABETIC;  Surgeon: Mittie Gaskin, MD;  Location: [facility];  Service: Ophthalmology;  Laterality: Left;  5.02 01:07.4 7.4%   CATARACT EXTRACTION W/PHACO Right 12/12/2020   Procedure: CATARACT EXTRACTION PHACO AND INTRAOCULAR LENS PLACEMENT (IOC) RIGHT DIABETIC 5.22 00:59.2;  Surgeon: Mittie Gaskin, MD;  Location: [facility];  Service: Ophthalmology;  Laterality: Right;   CHONDROPLASTY Left  02/10/2018   Procedure: CHONDROPLASTY;  Surgeon: Leora Lynwood SAUNDERS, MD;  Location: [facility];  Service: Orthopedics;  Laterality: Left;   EYE SURGERY     FOOT SURGERY Left 07/2022   bone removal   GASTRIC RESTRICTION SURGERY     for reflux   HYSTEROSCOPY WITH D & C N/A 10/18/2012   Procedure: DILATATION AND CURETTAGE /HYSTEROSCOPY;  Surgeon: Winton Felt, MD;  Location: [facility];  Service: Gynecology;  Laterality: N/A;   KNEE ARTHROSCOPY WITH MEDIAL MENISECTOMY Left 02/10/2018   Procedure: KNEE ARTHROSCOPY WITH MEDIAL and LATERAL MENISECTOMY;  Surgeon: Leora Lynwood SAUNDERS, MD;  Location: [facility];  Service: Orthopedics;  Laterality: Left;   POLYPECTOMY N/A 10/18/2012   Procedure: POLYPECTOMY;  Surgeon: Winton Felt, MD;  Location: [facility];  Service: Gynecology;  Laterality: N/A;   SYNOVECTOMY Left 02/10/2018   Procedure: SYNOVECTOMY;  Surgeon: Leora Lynwood SAUNDERS, MD;  Location: [facility];  Service: Orthopedics;  Laterality: Left;   Social History   Tobacco Use   Smoking status: Never   Smokeless tobacco: Never   Tobacco comments:    Never smoked  Vaping Use   Vaping status: Never Used  Substance Use Topics   Alcohol use: Never   Drug use: Never   Family History  Problem Relation Age of Onset   Diabetes Mother    Heart disease Mother        Pacemaker, CHF   Hypertension Mother    Kidney cancer Mother    Cancer Mother    Stroke Father    CAD Father 37       Died with MI   Heart disease Father    Esophageal cancer Brother    Diabetes Brother    Heart disease Sister    Cancer Brother    Diabetes Sister    Colon cancer Neg Hx  Rectal cancer Neg Hx    Stomach cancer Neg Hx    Pancreatic cancer Neg Hx    Allergies  Allergen Reactions   Cholestyramine Other (See Comments)    REACTION: Muscles tightened up, drew up.   Simvastatin  Other (See Comments)    Muscular pain   Lipitor [Atorvastatin] Other (See Comments)    Muscle cramping   Niacin Nausea And Vomiting   Current  Outpatient Medications on File Prior to Visit  Medication Sig Dispense Refill   Accu-Chek Softclix Lancets lancets TEST BLOOD SUGAR EVERY DAY 100 each 1   acetaminophen  (TYLENOL ) 500 MG tablet Take 1,000 mg by mouth every 6 (six) hours as needed for mild pain.      Alcohol Swabs (DROPSAFE ALCOHOL PREP) 70 % PADS USE ONE TIME DAILY AS DIRECTED 100 each 1   amitriptyline  (ELAVIL ) 25 MG tablet Take 1 tablet (25 mg total) by mouth at bedtime. 90 tablet 2   amLODipine  (NORVASC ) 5 MG tablet Take 1 tablet (5 mg total) by mouth daily. 90 tablet 3   aspirin 81 MG tablet Take 81 mg by mouth at bedtime.      Blood Glucose Calibration (ACCU-CHEK AVIVA) SOLN USE TO CALIBRATE METER AS DIRECTED 1 each 2   Blood Glucose Monitoring Suppl (ACCU-CHEK AVIVA PLUS) w/Device KIT Use to check blood sugar once daily for DM (dx. E11.9) 1 kit 0   calcium elemental as carbonate (BARIATRIC TUMS ULTRA) 400 MG chewable tablet Chew 1,000 mg by mouth daily as needed for heartburn.     Camphor-Menthol-Methyl Sal (SALONPAS) 3.07-19-08 % PTCH Apply topically.     carvedilol  (COREG ) 25 MG tablet Take 1 tablet (25 mg total) by mouth 2 (two) times daily. 180 tablet 3   Cholecalciferol (VITAMIN D ) 2000 units tablet Take 2,000 Units by mouth daily.     Docusate Sodium  (STOOL SOFTENER) 100 MG capsule Take 100 mg by mouth in the morning and at bedtime.     ezetimibe  (ZETIA ) 10 MG tablet Take 1 tablet (10 mg total) by mouth daily. 90 tablet 2   fenofibrate  (TRICOR ) 145 MG tablet TAKE 1 TABLET EVERY DAY 90 tablet 1   gabapentin  (NEURONTIN ) 300 MG capsule TAKE 1 CAPSULE THREE TIMES DAILY AS NEEDED 90 capsule 3   glucose blood (ACCU-CHEK AVIVA PLUS) test strip TEST BLOOD SUGAR EVERY DAY FOR DIABETES 100 strip 1   loratadine (CLARITIN) 10 MG tablet Take 10 mg by mouth daily as needed for allergies.     losartan  (COZAAR ) 100 MG tablet Take 1 tablet (100 mg total) by mouth daily. 90 tablet 3   meclizine  (ANTIVERT ) 25 MG tablet Take 1 tablet (25  mg total) by mouth 3 (three) times daily as needed for dizziness. 90 tablet 3   meloxicam  (MOBIC ) 15 MG tablet TAKE 1 TABLET(15 MG) BY MOUTH DAILY 30 tablet 1   metFORMIN  (GLUCOPHAGE ) 1000 MG tablet TAKE 1/2 TABLET TWICE DAILY WITH MEALS 90 tablet 2   NEXLETOL  180 MG TABS Take 1 tablet (180 mg total) by mouth daily. 90 tablet 2   omeprazole  (PRILOSEC) 40 MG capsule TAKE 1 CAPSULE IN THE MORNING AND AT BEDTIME. 180 capsule 1   ondansetron  (ZOFRAN ) 4 MG tablet TAKE 1 TABLET EVERY 4 HOURS AS NEEDED FOR NAUSEA AND VOMITING 90 tablet 0   SALINE NASAL SPRAY NA Place 1 spray into the nose daily as needed (congestion).      tirzepatide  (MOUNJARO ) 5 MG/0.5ML Pen Inject 5 mg into the skin once a week.  2 mL 0   Cyanocobalamin  (B-12) 2500 MCG SUBL Place 1 tablet under the tongue daily. (Patient not taking: Reported on 02/04/2024) 30 tablet 5   Ferrous Sulfate  (IRON  PO) Take 65 mg by mouth daily. (Patient not taking: Reported on 02/04/2024)     No current facility-administered medications on file prior to visit.    Review of Systems  Constitutional:  Positive for fatigue. Negative for activity change, appetite change, fever and unexpected weight change.  HENT:  Negative for congestion, ear pain, rhinorrhea, sinus pressure and sore throat.   Eyes:  Negative for pain, redness and visual disturbance.  Respiratory:  Negative for cough, shortness of breath and wheezing.   Cardiovascular:  Negative for chest pain and palpitations.  Gastrointestinal:  Negative for abdominal pain, blood in stool, constipation and diarrhea.  Endocrine: Negative for polydipsia and polyuria.  Genitourinary:  Negative for dysuria, frequency and urgency.  Musculoskeletal:  Positive for arthralgias. Negative for back pain and myalgias.  Skin:  Negative for pallor and rash.  Allergic/Immunologic: Negative for environmental allergies.  Neurological:  Negative for dizziness, syncope and headaches.  Hematological:  Negative for  adenopathy. Does not bruise/bleed easily.  Psychiatric/Behavioral:  Negative for decreased concentration and dysphoric mood. The patient is not nervous/anxious.        Objective:   Physical Exam Constitutional:      General: She is not in acute distress.    Appearance: Normal appearance. She is well-developed. She is obese. She is not ill-appearing or diaphoretic.  HENT:     Head: Normocephalic and atraumatic.     Right Ear: Tympanic membrane, ear canal and external ear normal.     Left Ear: Tympanic membrane, ear canal and external ear normal.     Nose: Nose normal. No congestion.     Mouth/Throat:     Mouth: Mucous membranes are moist.     Pharynx: Oropharynx is clear. No posterior oropharyngeal erythema.  Eyes:     General: No scleral icterus.    Extraocular Movements: Extraocular movements intact.     Conjunctiva/sclera: Conjunctivae normal.     Pupils: Pupils are equal, round, and reactive to light.  Neck:     Thyroid : No thyromegaly.     Vascular: No carotid bruit or JVD.  Cardiovascular:     Rate and Rhythm: Normal rate and regular rhythm.     Pulses: Normal pulses.     Heart sounds: Normal heart sounds.     No gallop.  Pulmonary:     Effort: Pulmonary effort is normal. No respiratory distress.     Breath sounds: Normal breath sounds. No wheezing.     Comments: Good air exch Chest:     Chest wall: No tenderness.  Abdominal:     General: Bowel sounds are normal. There is no distension or abdominal bruit.     Palpations: Abdomen is soft. There is no mass.     Tenderness: There is no abdominal tenderness.     Hernia: No hernia is present.  Genitourinary:    Comments: Breast exam: No mass, nodules, thickening, tenderness, bulging, retraction, inflamation, nipple discharge or skin changes noted.  No axillary or clavicular LA.     Baseline surgical changes of right breast w/o change   Musculoskeletal:        General: No tenderness. Normal range of motion.     Cervical  back: Normal range of motion and neck supple. No rigidity. No muscular tenderness.     Right lower leg: No  edema.     Left lower leg: No edema.     Comments: No kyphosis   Lymphadenopathy:     Cervical: No cervical adenopathy.  Skin:    General: Skin is warm and dry.     Coloration: Skin is not pale.     Findings: No erythema or rash.     Comments: Solar lentigines diffusely   Neurological:     Mental Status: She is alert. Mental status is at baseline.     Cranial Nerves: No cranial nerve deficit.     Motor: No abnormal muscle tone.     Coordination: Coordination normal.     Gait: Gait normal.     Deep Tendon Reflexes: Reflexes are normal and symmetric. Reflexes normal.  Psychiatric:        Mood and Affect: Mood normal.        Cognition and Memory: Cognition and memory normal.           Assessment & Plan:   Problem List Items Addressed This Visit       Cardiovascular and Mediastinum   Essential hypertension   bp in fair control at this time  BP Readings from Last 1 Encounters:  02/04/24 120/60  Better on 2nd check today  No changes needed Most recent labs reviewed  Disc lifstyle change with low sodium diet and exercise  Continues Losartan  50 mg bid Metoprolol  xl 100 mg daily  Amlodipine  5 mg daily             Digestive   GERD (gastroesophageal reflux disease)   Continues omeprazole  40 mg daily  Has EGD planned for dysphagia-anticipate esoph dilatation       Fatty liver   Liver tests in normal range Lab Results  Component Value Date   ALT 11 02/01/2024   AST 16 02/01/2024   ALKPHOS 48 02/01/2024   BILITOT 0.3 02/01/2024   Continues mounjaro  and weight loss effort         Endocrine   Hyperlipidemia associated with type 2 diabetes mellitus (HCC)   Disc goals for lipids and reasons to control them Rev last labs with pt Rev low sat fat diet in detail Not optimal- trig is 559   Zetia  10 mg daily  Tricor  145 mg daily  Bempedoic acid  180 mg  daily  Cannot take statins   Urged to continue follow up with cardiol and pharm      Relevant Medications   tirzepatide  (MOUNJARO ) 7.5 MG/0.5ML Pen   Diabetes type 2, controlled (HCC)   Lab Results  Component Value Date   HGBA1C 5.9 02/01/2024   HGBA1C 6.4 (A) 10/21/2023   HGBA1C 6.0 (A) 05/06/2023   Metformin  500 mg bid  Mounjaro  5 mg weekly-to go up to 7.5 in next 2 weeks Tolerating it well  Working on weight loss   Utd urine micro  On arb No statin due to myopathy   Follow up 3-6 mo      Relevant Medications   tirzepatide  (MOUNJARO ) 7.5 MG/0.5ML Pen     Musculoskeletal and Integument   Statin myopathy   Unable to take statins  LDL 47  Trig are still high        Genitourinary   Chronic kidney disease (CKD) stage G3b/A1, moderately decreased glomerular filtration rate (GFR) between 30-44 mL/min/1.73 square meter and albuminuria creatinine ratio less than 30 mg/g (HCC)   GFR is mproved at 60.4 Commended good water intake         Hematopoietic  and Hemostatic   Essential (hemorrhagic) thrombocythemia (HCC)   Continues heme care  Last plt ct 348 normal         Other   B12 deficiency (Chronic)   Lab Results  Component Value Date   VITAMINB12 >1500 (H) 02/01/2024   Holding b12 per heme now       Routine general medical examination at a health care facility - Primary   Reviewed health habits including diet and exercise and skin cancer prevention Reviewed appropriate screening tests for age  Also reviewed health mt list, fam hx and immunization status , as well as social and family history   See HPI Labs reviewed and ordered Health Maintenance  Topic Date Due   Complete foot exam   09/18/2022   COVID-19 Vaccine (4 - 2024-25 season) 06/24/2025*   Stool Blood Test  02/02/2026*   Flu Shot  02/12/2024   Mammogram  07/05/2024   Hemoglobin A1C  08/03/2024   Yearly kidney health urinalysis for diabetes  10/20/2024   Eye exam for diabetics  11/10/2024    Yearly kidney function blood test for diabetes  01/31/2025   Medicare Annual Wellness Visit  02/03/2025   Colon Cancer Screening  05/01/2028   Pneumococcal Vaccine for age over 14  Completed   DEXA scan (bone density measurement)  Completed   Hepatitis C Screening  Completed   Zoster (Shingles) Vaccine  Completed   Hepatitis B Vaccine  Aged Out   HPV Vaccine  Aged Out   Meningitis B Vaccine  Aged Out   DTaP/Tdap/Td vaccine  Discontinued  *Topic was postponed. The date shown is not the original due date.    Dexa 2021-normal / plan to repeat at 5 y Discussed fall prevention, supplements and exercise for bone density  PHQ up a bit due to chronic pain (knee) and fatigue       Obesity (BMI 30-39.9)   Weight down to 173 Going up on mounjaro  in 2 wk  Some swallowing problems- once this resolves will eat better Discussed different sources of protein   Discussed how this problem influences overall health and the risks it imposes  Reviewed plan for weight loss with lower calorie diet (via better food choices (lower glycemic and portion control) along with exercise building up to or more than 30 minutes 5 days per week including some aerobic activity and strength training         Relevant Medications   tirzepatide  (MOUNJARO ) 7.5 MG/0.5ML Pen

## 2024-02-04 NOTE — Assessment & Plan Note (Signed)
 Continues heme care  Last plt ct 348 normal

## 2024-02-04 NOTE — Assessment & Plan Note (Signed)
 bp in fair control at this time  BP Readings from Last 1 Encounters:  02/04/24 120/60  Better on 2nd check today  No changes needed Most recent labs reviewed  Disc lifstyle change with low sodium diet and exercise  Continues Losartan  50 mg bid Metoprolol  xl 100 mg daily  Amlodipine  5 mg daily

## 2024-02-04 NOTE — Assessment & Plan Note (Signed)
 Lab Results  Component Value Date   HGBA1C 5.9 02/01/2024   HGBA1C 6.4 (A) 10/21/2023   HGBA1C 6.0 (A) 05/06/2023   Metformin  500 mg bid  Mounjaro  5 mg weekly-to go up to 7.5 in next 2 weeks Tolerating it well  Working on weight loss   Utd urine micro  On arb No statin due to myopathy   Follow up 3-6 mo

## 2024-02-04 NOTE — Assessment & Plan Note (Signed)
 Continues omeprazole  40 mg daily  Has EGD planned for dysphagia-anticipate esoph dilatation

## 2024-02-04 NOTE — Assessment & Plan Note (Signed)
 Disc goals for lipids and reasons to control them Rev last labs with pt Rev low sat fat diet in detail Not optimal- trig is 559   Zetia  10 mg daily  Tricor  145 mg daily  Bempedoic acid  180 mg daily  Cannot take statins   Urged to continue follow up with cardiol and pharm

## 2024-02-04 NOTE — Assessment & Plan Note (Signed)
 Liver tests in normal range Lab Results  Component Value Date   ALT 11 02/01/2024   AST 16 02/01/2024   ALKPHOS 48 02/01/2024   BILITOT 0.3 02/01/2024   Continues mounjaro  and weight loss effort

## 2024-02-04 NOTE — Assessment & Plan Note (Signed)
 Reviewed health habits including diet and exercise and skin cancer prevention Reviewed appropriate screening tests for age  Also reviewed health mt list, fam hx and immunization status , as well as social and family history   See HPI Labs reviewed and ordered Health Maintenance  Topic Date Due   Complete foot exam   09/18/2022   COVID-19 Vaccine (4 - 2024-25 season) 06/24/2025*   Stool Blood Test  02/02/2026*   Flu Shot  02/12/2024   Mammogram  07/05/2024   Hemoglobin A1C  08/03/2024   Yearly kidney health urinalysis for diabetes  10/20/2024   Eye exam for diabetics  11/10/2024   Yearly kidney function blood test for diabetes  01/31/2025   Medicare Annual Wellness Visit  02/03/2025   Colon Cancer Screening  05/01/2028   Pneumococcal Vaccine for age over 32  Completed   DEXA scan (bone density measurement)  Completed   Hepatitis C Screening  Completed   Zoster (Shingles) Vaccine  Completed   Hepatitis B Vaccine  Aged Out   HPV Vaccine  Aged Out   Meningitis B Vaccine  Aged Out   DTaP/Tdap/Td vaccine  Discontinued  *Topic was postponed. The date shown is not the original due date.    Dexa 2021-normal / plan to repeat at 5 y Discussed fall prevention, supplements and exercise for bone density  PHQ up a bit due to chronic pain (knee) and fatigue

## 2024-02-04 NOTE — Assessment & Plan Note (Signed)
 Unable to take statins  LDL 47  Trig are still high

## 2024-02-04 NOTE — Assessment & Plan Note (Signed)
 Weight down to 173 Going up on mounjaro  in 2 wk  Some swallowing problems- once this resolves will eat better Discussed different sources of protein   Discussed how this problem influences overall health and the risks it imposes  Reviewed plan for weight loss with lower calorie diet (via better food choices (lower glycemic and portion control) along with exercise building up to or more than 30 minutes 5 days per week including some aerobic activity and strength training

## 2024-02-15 ENCOUNTER — Ambulatory Visit (AMBULATORY_SURGERY_CENTER): Admitting: Internal Medicine

## 2024-02-15 ENCOUNTER — Encounter: Payer: Self-pay | Admitting: Internal Medicine

## 2024-02-15 VITALS — BP 150/65 | HR 84 | Temp 98.1°F | Resp 25 | Ht 59.0 in | Wt 175.0 lb

## 2024-02-15 DIAGNOSIS — E119 Type 2 diabetes mellitus without complications: Secondary | ICD-10-CM | POA: Diagnosis not present

## 2024-02-15 DIAGNOSIS — D509 Iron deficiency anemia, unspecified: Secondary | ICD-10-CM | POA: Diagnosis not present

## 2024-02-15 DIAGNOSIS — F32A Depression, unspecified: Secondary | ICD-10-CM | POA: Diagnosis not present

## 2024-02-15 DIAGNOSIS — E669 Obesity, unspecified: Secondary | ICD-10-CM | POA: Diagnosis not present

## 2024-02-15 DIAGNOSIS — R1310 Dysphagia, unspecified: Secondary | ICD-10-CM | POA: Diagnosis not present

## 2024-02-15 DIAGNOSIS — I10 Essential (primary) hypertension: Secondary | ICD-10-CM | POA: Diagnosis not present

## 2024-02-15 DIAGNOSIS — K219 Gastro-esophageal reflux disease without esophagitis: Secondary | ICD-10-CM | POA: Diagnosis not present

## 2024-02-15 DIAGNOSIS — K222 Esophageal obstruction: Secondary | ICD-10-CM

## 2024-02-15 MED ORDER — SODIUM CHLORIDE 0.9 % IV SOLN: 500.0000 mL | Freq: Once | INTRAVENOUS | Status: DC

## 2024-02-15 NOTE — Patient Instructions (Signed)
 See handout on post dilation diet Continue present medications Resume care with Dr Laine Balls and hematology  Handouts/information given for post dilation diet  YOU HAD AN ENDOSCOPIC PROCEDURE TODAY AT THE [facility]:   Refer to the procedure report that was given to you for any specific questions about what was found during the examination.  If the procedure report does not answer your questions, please call your gastroenterologist to clarify.  If you requested that your care partner not be given the details of your procedure findings, then the procedure report has been included in a sealed envelope for you to review at your convenience later.  YOU SHOULD EXPECT: Some feelings of bloating in the abdomen. Passage of more gas than usual.  Walking can help get rid of the air that was put into your GI tract during the procedure and reduce the bloating. If you had a lower endoscopy (such as a colonoscopy or flexible sigmoidoscopy) you may notice spotting of blood in your stool or on the toilet paper. If you underwent a bowel prep for your procedure, you may not have a normal bowel movement for a few days.  Please Note:  You might notice some irritation and congestion in your nose or some drainage.  This is from the oxygen used during your procedure.  There is no need for concern and it should clear up in a day or so.  SYMPTOMS TO REPORT IMMEDIATELY:  Following upper endoscopy (EGD)  Vomiting of blood or coffee ground material  New chest pain or pain under the shoulder blades  Painful or persistently difficult swallowing  New shortness of breath  Fever of 100F or higher  Black, tarry-looking stools For urgent or emergent issues, a gastroenterologist can be reached at any hour by calling [phone]. Do not use MyChart messaging for urgent concerns.   DIET:  We do recommend a small meal at first, but then you may proceed to your regular diet.  Drink plenty of fluids but you  should avoid alcoholic beverages for 24 hours.  ACTIVITY:  You should plan to take it easy for the rest of today and you should NOT DRIVE or use heavy machinery until tomorrow (because of the sedation medicines used during the test).    FOLLOW UP: Our staff will call the number listed on your records the next business day following your procedure.  We will call around 7:15- 8:00 am to check on you and address any questions or concerns that you may have regarding the information given to you following your procedure. If we do not reach you, we will leave a message.      SIGNATURES/CONFIDENTIALITY: You and/or your care partner have signed paperwork which will be entered into your electronic medical record.  These signatures attest to the fact that that the information above on your After Visit Summary has been reviewed and is understood.  Full responsibility of the confidentiality of this discharge information lies with you and/or your care-partner.

## 2024-02-15 NOTE — Op Note (Signed)
 [facility] Patient Name: Martha Hamilton Procedure Date: 02/15/2024 10:55 AM MRN: [id number] Endoscopist: Norleen SAILOR. Abran , MD, [id number] Age: 73 Referring MD:  Date of Birth: August 08, 1950 Gender: Female Account #: [id number] Procedure:                Upper GI endoscopy with balloon dilation of the                            esophagus. 20 mm max Indications:              Therapeutic procedure, Iron  deficiency anemia,                            Dysphagia, Esophageal reflux Medicines:                Monitored Anesthesia Care Procedure:                Pre-Anesthesia Assessment:                           - Prior to the procedure, a History and Physical                            was performed, and patient medications and                            allergies were reviewed. The patient's tolerance of                            previous anesthesia was also reviewed. The risks                            and benefits of the procedure and the sedation                            options and risks were discussed with the patient.                            All questions were answered, and informed consent                            was obtained. Prior Anticoagulants: The patient has                            taken no anticoagulant or antiplatelet agents. ASA                            Grade Assessment: III - A patient with severe                            systemic disease. After reviewing the risks and                            benefits, the patient was deemed in satisfactory  condition to undergo the procedure.                           After obtaining informed consent, the endoscope was                            passed under direct vision. Throughout the                            procedure, the patient's blood pressure, pulse, and                            oxygen saturations were monitored continuously. The                            Olympus Scope [id number] was  introduced through the                            mouth, and advanced to the second part of duodenum.                            The upper GI endoscopy was accomplished without                            difficulty. The patient tolerated the procedure                            well. Scope In: Scope Out: Findings:                 The esophagus revealed a large caliber distal ring                            at 38 cm from the incisors. This was balloon                            dilated sequentially from 18 to 20 mm. No ring                            disruption.                           The stomach revealed evidence of prior                            fundoplication with small slippage. No mucosal                            lesions or abnormalities.                           The examined duodenum was normal.                           The cardia and gastric fundus were normal on  retroflexion. Complications:            No immediate complications. Estimated Blood Loss:     Estimated blood loss: none. Impression:               1. Esophageal stricture dilated                           2. Prior fundoplication                           3. Otherwise unremarkable EGD. Recommendation:           - Patient has a contact number available for                            emergencies. The signs and symptoms of potential                            delayed complications were discussed with the                            patient. Return to normal activities tomorrow.                            Written discharge instructions were provided to the                            patient.                           - Post dilation diet.                           - Continue present medications.                           - Resume care with your PCP and hematology Martha Hamilton N. Abran, MD 02/15/2024 11:15:23 AM This report has been signed electronically.

## 2024-02-15 NOTE — Progress Notes (Signed)
 Expand All Collapse All HISTORY OF PRESENT ILLNESS:   Martha Hamilton is a 73 y.o. female who is sent today by hematology regarding recurrent iron  deficiency anemia.  She has been evaluated in this office for the same.  She also has a history of GERD with prior fundoplication.  She is accompanied today by [facility] December 2024 was 12.8.  Hemoglobin Dec 11, 2023 was 9.4 with MCV 87.  Iron , B12, and folate studies at that time were normal.  She was placed on iron  supplementation which she did not tolerate.  Hemoglobin December 20, 2023 was 9.7.  MCV 88.  She has received 5 iron  infusions.  She denies melena or hematochezia.  Hemoccult testing last year was negative.  Most recent GI evaluations are as follows:   1.  Capsule endoscopy May 21, 2021.  Complete study with adequate preparation was normal. 2.  Upper endoscopy with biopsies.  May 01, 2021.  Prior fundoplication.  Otherwise normal.  Negative duodenal biopsies. 3.  Colonoscopy with cold snare polypectomy x 2.  May 01, 2021.  Normal terminal ileum.  Diverticulosis and hemorrhoids in addition to diminutive polyps.  Follow-up in 7 years recommended.   Patient does notice fatigue with her anemia.  She has had some nausea.  She also reports significant dysphagia to solid foods and pills.  She takes omeprazole  daily for GERD   REVIEW OF SYSTEMS:   All non-GI ROS negative unless otherwise stated in the HPI except for arthritis, back pain, muscle cramps, sore throat, fatigue       Past Medical History:  Diagnosis Date   Allergic rhinitis     Allergy     Asthma     Breast cancer [facility]) 1996    right breast   Cataract Oct. 2021   Depression 1981   DM2 (diabetes mellitus, type 2) (HCC)     GERD (gastroesophageal reflux disease)     Hx of cardiovascular stress test      a. Lex MV 2/14:  EF 69%, no ischemia   Hyperlipidemia     Hypertension     IDA (iron  deficiency anemia) 08/27/2020   Iron  deficiency anemia     Obesity      Osteoarthritis      hands   Thyroid  nodule 06/29/2023   Ulcer                 Past Surgical History:  Procedure Laterality Date   APPENDECTOMY   1976   BREAST LUMPECTOMY Right 1996    right breast, lumpectomy w/nodes   CATARACT EXTRACTION W/PHACO Left 11/28/2020    Procedure: CATARACT EXTRACTION PHACO AND INTRAOCULAR LENS PLACEMENT (IOC) LEFT DIABETIC;  Surgeon: Mittie Gaskin, MD;  Location: [facility];  Service: Ophthalmology;  Laterality: Left;  5.02 01:07.4 7.4%   CATARACT EXTRACTION W/PHACO Right 12/12/2020    Procedure: CATARACT EXTRACTION PHACO AND INTRAOCULAR LENS PLACEMENT (IOC) RIGHT DIABETIC 5.22 00:59.2;  Surgeon: Mittie Gaskin, MD;  Location: [facility];  Service: Ophthalmology;  Laterality: Right;   CHONDROPLASTY Left 02/10/2018    Procedure: CHONDROPLASTY;  Surgeon: Leora Lynwood SAUNDERS, MD;  Location: [facility];  Service: Orthopedics;  Laterality: Left;   EYE SURGERY       FOOT SURGERY Left 07/2022    bone removal   GASTRIC RESTRICTION SURGERY        for reflux   HYSTEROSCOPY WITH D & C N/A 10/18/2012    Procedure: DILATATION AND CURETTAGE /HYSTEROSCOPY;  Surgeon: Winton  Constant, MD;  Location: [facility];  Service: Gynecology;  Laterality: N/A;   KNEE ARTHROSCOPY WITH MEDIAL MENISECTOMY Left 02/10/2018    Procedure: KNEE ARTHROSCOPY WITH MEDIAL and LATERAL MENISECTOMY;  Surgeon: Leora Lynwood SAUNDERS, MD;  Location: [facility];  Service: Orthopedics;  Laterality: Left;   POLYPECTOMY N/A 10/18/2012    Procedure: POLYPECTOMY;  Surgeon: Winton Felt, MD;  Location: [facility];  Service: Gynecology;  Laterality: N/A;   SYNOVECTOMY Left 02/10/2018    Procedure: SYNOVECTOMY;  Surgeon: Leora Lynwood SAUNDERS, MD;  Location: [facility];  Service: Orthopedics;  Laterality: Left;          Social History Archer Vise  reports that she has never smoked. She has never used smokeless tobacco. She reports that she does not drink alcohol and does not use drugs.    family history includes CAD (age of onset: 66) in her father; Cancer in her brother and mother; Diabetes in her brother, mother, and sister; Esophageal cancer in her brother; Heart disease in her father, mother, and sister; Hypertension in her mother; Kidney cancer in her mother; Stroke in her father.   Allergies       Allergies  Allergen Reactions   Cholestyramine Other (See Comments)      REACTION: Muscles tightened up, drew up.   Simvastatin  Other (See Comments)      Muscular pain   Lipitor [Atorvastatin] Other (See Comments)      Muscle cramping   Niacin Nausea And Vomiting            PHYSICAL EXAMINATION: Vital signs: BP 136/72   Pulse 85   Ht 4' 11 (1.499 m)   Wt 175 lb (79.4 kg)   LMP 06/13/2001   BMI 35.35 kg/m   Constitutional: Pleasant, overweight, somewhat unhealthy appearing, no acute distress Psychiatric: alert and oriented x3, cooperative Eyes: extraocular movements intact, anicteric, conjunctiva pink Mouth: oral pharynx moist, no lesions Neck: supple no lymphadenopathy Cardiovascular: heart regular rate and rhythm, no murmur Lungs: clear to auscultation bilaterally Abdomen: soft, nontender, nondistended, no obvious ascites, no peritoneal signs, normal bowel sounds, no organomegaly Rectal: Omitted Extremities: no clubbing, cyanosis, or lower extremity edema bilaterally Skin: no lesions on visible extremities Neuro: No focal deficits.    ASSESSMENT:   1.  Recurrent anemia.  Possibly iron  deficiency.  Prior extensive GI workup as described 2.  GERD 3.  Prior fundoplication 4.  Intermittent solid food dysphagia 5.  History of adenomatous colon polyps.  Surveillance up-to-date     PLAN:   1.  Check CBC and ferritin today 2.  Schedule upper endoscopy with esophageal dilation.The nature of the procedure, as well as the risks, benefits, and alternatives were carefully and thoroughly reviewed with the patient. Ample time for discussion and questions  allowed. The patient understood, was satisfied, and agreed to proceed. 3.  Ongoing assessment, monitoring and management of anemia per hematology 7.  Surveillance colonoscopy around 2029

## 2024-02-15 NOTE — Progress Notes (Signed)
 Called to room to assist during endoscopic procedure.  Patient ID and intended procedure confirmed with present staff. Received instructions for my participation in the procedure from the performing physician.

## 2024-02-15 NOTE — Progress Notes (Signed)
 A/o x 3, VSS, gd SR's, pleased with anesthesia, report to RN

## 2024-02-16 ENCOUNTER — Telehealth: Payer: Self-pay

## 2024-02-16 NOTE — Telephone Encounter (Signed)
  Follow up Call-     02/15/2024    9:57 AM  Call back number  Post procedure Call Back phone  # [phone]  Permission to leave phone message Yes     Patient questions:  Do you have a fever, pain , or abdominal swelling? No. Pain Score  0 *  Have you tolerated food without any problems? Yes.    Have you been able to return to your normal activities? Yes.    Do you have any questions about your discharge instructions: Diet   No. Medications  No. Follow up visit  No.  Do you have questions or concerns about your Care? No.  Actions: * If pain score is 4 or above: No action needed, pain <4.

## 2024-02-24 DIAGNOSIS — M5412 Radiculopathy, cervical region: Secondary | ICD-10-CM | POA: Diagnosis not present

## 2024-02-29 DIAGNOSIS — M170 Bilateral primary osteoarthritis of knee: Secondary | ICD-10-CM | POA: Diagnosis not present

## 2024-03-15 ENCOUNTER — Telehealth: Payer: Self-pay | Admitting: *Deleted

## 2024-03-15 NOTE — Telephone Encounter (Signed)
 7.5 mounjaro - correct ?  Does she want to go up to the next dose or stay with this?  How is she tolerating it ?

## 2024-03-15 NOTE — Telephone Encounter (Signed)
 Copied from CRM #[id number]. Topic: Clinical - Medical Advice >> Mar 15, 2024  8:41 AM Anairis L wrote: Reason for CRM: Patient is calling in, to inform nurse that is is going to take her last shot of 7.45 mg. Would like her next round of shots order.

## 2024-03-16 MED ORDER — TIRZEPATIDE 10 MG/0.5ML ~~LOC~~ SOAJ
10.0000 mg | SUBCUTANEOUS | 0 refills | Status: DC
Start: 2024-03-16 — End: 2024-04-12

## 2024-03-16 NOTE — Telephone Encounter (Signed)
 Called pt and she is doing good on the 7.5 with no side eff or GI sxs. Pt does want to go up to the next dose. Pt advised if any side eff or GI problems on higher dose to let us  know

## 2024-03-25 ENCOUNTER — Ambulatory Visit

## 2024-03-29 ENCOUNTER — Other Ambulatory Visit: Payer: Self-pay | Admitting: Cardiology

## 2024-04-12 ENCOUNTER — Other Ambulatory Visit: Payer: Self-pay | Admitting: Family Medicine

## 2024-04-12 ENCOUNTER — Ambulatory Visit: Payer: Self-pay

## 2024-04-12 ENCOUNTER — Encounter: Payer: Self-pay | Admitting: Cardiology

## 2024-04-12 ENCOUNTER — Ambulatory Visit (INDEPENDENT_AMBULATORY_CARE_PROVIDER_SITE_OTHER)

## 2024-04-12 DIAGNOSIS — Z23 Encounter for immunization: Secondary | ICD-10-CM | POA: Diagnosis not present

## 2024-04-12 MED ORDER — TIRZEPATIDE 10 MG/0.5ML ~~LOC~~ SOAJ: 10.0000 mg | SUBCUTANEOUS | 0 refills | Status: DC

## 2024-04-12 NOTE — Telephone Encounter (Signed)
 Thanks for letting me know  I refilled for the 10 mg   Let us  know if any side effects change or worsen

## 2024-04-12 NOTE — Telephone Encounter (Signed)
 FYI Only or Action Required?: Action required by provider: medication refill request. Pt would like to stay at 10 mg mounjaro  for the next 4 weeks.   Patient was last seen in primary care on 02/04/2024 by Randeen Laine LABOR, MD.  Called Nurse Triage reporting Medication Problem.  Symptoms began several weeks ago.  Interventions attempted: Rest, hydration, or home remedies.  Symptoms are: unchanged.  Triage Disposition: Information or Advice Only Call  Patient/caregiver understands and will follow disposition?: Yes, will follow disposition  Pt states that she is concerned that her body is not ready for the increase in dose. Pt states that she has no appetite, and states that her BP becomes low each morning from 0900-1000. Pt is symptomatic at that time. Pt states that she takes her amlodipine  in the AM, as well as carvedilol  AM, and losartan  in AM. Pt states that she has not increased doses. Pt was advised to update her cardiologist as that is who writes the BP rx's. Pt would like to stay at the Mounjaro  10 mg dose for another 4 weeks.  Copied from CRM [id number]. Topic: Clinical - Red Word Triage >> Apr 12, 2024  2:19 PM Frederich PARAS wrote: [facility] that prompted transfer to Nurse Triage: light headed, low bp,  pt called in to submit a fill for the tirzepatide  (MOUNJARO ) 10 MG/0.5ML Pen. Pt then went to say how she wanted to le the dr know that she would like the  mounjaro  to stay at 10mg  instead of increasing for a week because she is not feeling well. Pt says she has not been eating as much,nausea, no apteite, bp is low and very light headed

## 2024-04-12 NOTE — Telephone Encounter (Unsigned)
 Copied from CRM #[id number]. Topic: Clinical - Medication Refill >> Apr 12, 2024  2:14 PM Frederich PARAS wrote: Medication: tirzepatide  (MOUNJARO ) 10 MG/0.5ML Pen  Has the patient contacted their pharmacy? No She says they have to call in with  an new fill   This is the patient's preferred pharmacy:    [id number] - [place], [place] - [address] AT [facility] [address] [facility] [address] [place] [address] Phone: [phone] Fax: [phone]  Is this the correct pharmacy for this prescription? Yes If no, delete pharmacy and type the correct one.   Has the prescription been filled recently? Yes  Is the patient out of the medication? Yes, she takes her last shot in the morning   Has the patient been seen for an appointment in the last year OR does the patient have an upcoming appointment? No  Can we respond through MyChart? Yes  Agent: Please be advised that Rx refills may take up to 3 business days. We ask that you follow-up with your pharmacy.

## 2024-04-13 NOTE — Telephone Encounter (Signed)
 Called patient to give instructions on Dr Stanly recommendations. Patient verbalized understanding and will stop amlodipine  and continue to monitor blood pressure and heart rate.

## 2024-04-13 NOTE — Telephone Encounter (Signed)
 Called patient and reviewed all information. Patient verbalized understanding. Will call if any further questions.

## 2024-04-15 ENCOUNTER — Inpatient Hospital Stay: Attending: Internal Medicine

## 2024-04-15 DIAGNOSIS — D509 Iron deficiency anemia, unspecified: Secondary | ICD-10-CM | POA: Insufficient documentation

## 2024-04-15 DIAGNOSIS — N189 Chronic kidney disease, unspecified: Secondary | ICD-10-CM | POA: Diagnosis not present

## 2024-04-15 DIAGNOSIS — Z800 Family history of malignant neoplasm of digestive organs: Secondary | ICD-10-CM | POA: Diagnosis not present

## 2024-04-15 DIAGNOSIS — D631 Anemia in chronic kidney disease: Secondary | ICD-10-CM | POA: Insufficient documentation

## 2024-04-15 DIAGNOSIS — E538 Deficiency of other specified B group vitamins: Secondary | ICD-10-CM | POA: Diagnosis not present

## 2024-04-15 DIAGNOSIS — Z8051 Family history of malignant neoplasm of kidney: Secondary | ICD-10-CM | POA: Insufficient documentation

## 2024-04-15 DIAGNOSIS — D508 Other iron deficiency anemias: Secondary | ICD-10-CM

## 2024-04-15 DIAGNOSIS — Z853 Personal history of malignant neoplasm of breast: Secondary | ICD-10-CM | POA: Insufficient documentation

## 2024-04-15 LAB — CBC WITH DIFFERENTIAL (CANCER CENTER ONLY)
Abs Immature Granulocytes: 0.04 K/uL (ref 0.00–0.07)
Basophils Absolute: 0 K/uL (ref 0.0–0.1)
Basophils Relative: 1 %
Eosinophils Absolute: 0.2 K/uL (ref 0.0–0.5)
Eosinophils Relative: 7 %
HCT: 26.4 % — ABNORMAL LOW (ref 36.0–46.0)
Hemoglobin: 8.8 g/dL — ABNORMAL LOW (ref 12.0–15.0)
Immature Granulocytes: 1 %
Lymphocytes Relative: 28 %
Lymphs Abs: 1 K/uL (ref 0.7–4.0)
MCH: 30.1 pg (ref 26.0–34.0)
MCHC: 33.3 g/dL (ref 30.0–36.0)
MCV: 90.4 fL (ref 80.0–100.0)
Monocytes Absolute: 0.2 K/uL (ref 0.1–1.0)
Monocytes Relative: 7 %
Neutro Abs: 2.1 K/uL (ref 1.7–7.7)
Neutrophils Relative %: 56 %
Platelet Count: 338 K/uL (ref 150–400)
RBC: 2.92 MIL/uL — ABNORMAL LOW (ref 3.87–5.11)
RDW: 14.6 % (ref 11.5–15.5)
WBC Count: 3.6 K/uL — ABNORMAL LOW (ref 4.0–10.5)
nRBC: 0 % (ref 0.0–0.2)

## 2024-04-15 LAB — VITAMIN B12: Vitamin B-12: 486 pg/mL (ref 180–914)

## 2024-04-15 LAB — FOLATE: Folate: 8.2 ng/mL (ref >5.9)

## 2024-04-15 LAB — IRON AND TIBC
Iron: 46 ug/dL (ref 28–170)
Saturation Ratios: 13 % (ref 10.4–31.8)
TIBC: 361 ug/dL (ref 250–450)
UIBC: 315 ug/dL

## 2024-04-15 LAB — FERRITIN: Ferritin: 37 ng/mL (ref 11–307)

## 2024-04-19 ENCOUNTER — Inpatient Hospital Stay

## 2024-04-19 ENCOUNTER — Encounter: Payer: Self-pay | Admitting: Oncology

## 2024-04-19 ENCOUNTER — Inpatient Hospital Stay: Admitting: Oncology

## 2024-04-19 VITALS — BP 145/70 | HR 100 | Temp 98.4°F | Resp 18 | Wt 166.9 lb

## 2024-04-19 DIAGNOSIS — N1831 Chronic kidney disease, stage 3a: Secondary | ICD-10-CM | POA: Diagnosis not present

## 2024-04-19 DIAGNOSIS — D649 Anemia, unspecified: Secondary | ICD-10-CM

## 2024-04-19 DIAGNOSIS — N189 Chronic kidney disease, unspecified: Secondary | ICD-10-CM | POA: Diagnosis not present

## 2024-04-19 DIAGNOSIS — D631 Anemia in chronic kidney disease: Secondary | ICD-10-CM | POA: Diagnosis not present

## 2024-04-19 DIAGNOSIS — E538 Deficiency of other specified B group vitamins: Secondary | ICD-10-CM | POA: Diagnosis not present

## 2024-04-19 DIAGNOSIS — Z853 Personal history of malignant neoplasm of breast: Secondary | ICD-10-CM | POA: Diagnosis not present

## 2024-04-19 DIAGNOSIS — Z8051 Family history of malignant neoplasm of kidney: Secondary | ICD-10-CM | POA: Diagnosis not present

## 2024-04-19 DIAGNOSIS — D509 Iron deficiency anemia, unspecified: Secondary | ICD-10-CM | POA: Diagnosis not present

## 2024-04-19 DIAGNOSIS — Z800 Family history of malignant neoplasm of digestive organs: Secondary | ICD-10-CM | POA: Diagnosis not present

## 2024-04-19 LAB — BASIC METABOLIC PANEL - CANCER CENTER ONLY
Anion gap: 13 (ref 5–15)
BUN: 19 mg/dL (ref 8–23)
CO2: 20 mmol/L — ABNORMAL LOW (ref 22–32)
Calcium: 9.1 mg/dL (ref 8.9–10.3)
Chloride: 100 mmol/L (ref 98–111)
Creatinine: 1.14 mg/dL — ABNORMAL HIGH (ref 0.44–1.00)
GFR, Estimated: 51 mL/min — ABNORMAL LOW (ref >60)
Glucose, Bld: 109 mg/dL — ABNORMAL HIGH (ref 70–99)
Potassium: 4.7 mmol/L (ref 3.5–5.1)
Sodium: 133 mmol/L — ABNORMAL LOW (ref 135–145)

## 2024-04-19 LAB — CBC WITH DIFFERENTIAL/PLATELET
Abs Immature Granulocytes: 0 K/uL (ref 0.00–0.07)
Band Neutrophils: 1 %
Basophils Absolute: 0.1 K/uL (ref 0.0–0.1)
Basophils Relative: 1 %
Blasts: 0 %
Eosinophils Absolute: 0.3 K/uL (ref 0.0–0.5)
Eosinophils Relative: 6 %
HCT: 27.1 % — ABNORMAL LOW (ref 36.0–46.0)
Hemoglobin: 8.8 g/dL — ABNORMAL LOW (ref 12.0–15.0)
Immature Granulocytes: 0 %
Lymphocytes Relative: 21 %
Lymphs Abs: 1.1 K/uL (ref 0.7–4.0)
MCH: 29.2 pg (ref 26.0–34.0)
MCHC: 32.5 g/dL (ref 30.0–36.0)
MCV: 90 fL (ref 80.0–100.0)
Metamyelocytes Relative: 0 %
Monocytes Absolute: 0.6 K/uL (ref 0.1–1.0)
Monocytes Relative: 11 %
Myelocytes: 0 %
Neutro Abs: 3.1 K/uL (ref 1.7–7.7)
Neutrophils Relative %: 60 %
Other: 0 %
Platelets: 362 K/uL (ref 150–400)
Promyelocytes Relative: 0 %
RBC: 3.01 MIL/uL — ABNORMAL LOW (ref 3.87–5.11)
RDW: 14.6 % (ref 11.5–15.5)
WBC: 5.2 K/uL (ref 4.0–10.5)
nRBC: 0 % (ref 0.0–0.2)
nRBC: 0 /100{WBCs}

## 2024-04-19 LAB — RETIC PANEL
Immature Retic Fract: 28 % — ABNORMAL HIGH (ref 2.3–15.9)
RBC.: 2.99 MIL/uL — ABNORMAL LOW (ref 3.87–5.11)
Retic Count, Absolute: 89.1 K/uL (ref 19.0–186.0)
Retic Ct Pct: 3 % (ref 0.4–3.1)
Reticulocyte Hemoglobin: 29.5 pg (ref >27.9)

## 2024-04-19 LAB — TECHNOLOGIST SMEAR REVIEW
Plt Morphology: NORMAL
RBC MORPHOLOGY: NORMAL
WBC MORPHOLOGY: NORMAL

## 2024-04-19 LAB — HEPATIC FUNCTION PANEL
ALT: 12 U/L (ref 0–44)
AST: 32 U/L (ref 15–41)
Albumin: 3.2 g/dL — ABNORMAL LOW (ref 3.5–5.0)
Alkaline Phosphatase: 38 U/L (ref 38–126)
Bilirubin, Direct: <0.1 mg/dL (ref 0.0–0.2)
Total Bilirubin: 0.7 mg/dL (ref 0.0–1.2)
Total Protein: 6.6 g/dL (ref 6.5–8.1)

## 2024-04-19 LAB — LACTATE DEHYDROGENASE: LDH: 92 U/L — ABNORMAL LOW (ref 98–192)

## 2024-04-19 NOTE — Assessment & Plan Note (Addendum)
 B12 level is normal, continue sublingual B12 2500mcg 2 times per week  Hold off B12 injection

## 2024-04-19 NOTE — Assessment & Plan Note (Addendum)
 Check multiple myeloma panel, light chain ratio reticulocyte panel, LDH, parvovirus haptoglobin, vitamin B12,  In the context of anemia due to chronic kidney disease, recommend additional IV Venofer  treatments to improve iron  stores, goal ferritin is close to 200. Patient may need erythropoietin replacement therapy.

## 2024-04-19 NOTE — Progress Notes (Signed)
 Hematology/Oncology follow up note [phone] [phone]   Patient Care Team: Tower, Laine LABOR, MD as PCP - General Darliss Rogue, MD as PCP - Cardiology (Cardiology) Babara Call, MD as Consulting Physician (Oncology)  ASSESSMENT & PLAN:   IDA (iron  deficiency anemia) Iron  deficiency anemia Chronic blood loss vs malabsorption.  She has had GI work up in the past.  Labs reviewed and discussed with patient.   Lab Results  Component Value Date   HGB 8.8 (L) 04/15/2024   TIBC 361 04/15/2024   IRONPCTSAT 13 04/15/2024   FERRITIN 37 04/15/2024   Patient is status post IV Venofer  treatments.  Hemoglobin further decreased since currently iron  panel within normal limits no significant iron  deficiency.     B12 deficiency B12 level is normal, continue sublingual B12 2500mcg 2 times per week  Hold off B12 injection   Anemia in chronic kidney disease (CKD) Check multiple myeloma panel, light chain ratio reticulocyte panel, LDH, parvovirus haptoglobin, vitamin B12,  In the context of anemia due to chronic kidney disease, recommend additional IV Venofer  treatments to improve iron  stores, goal ferritin is close to 200. Patient may need erythropoietin replacement therapy.  Orders Placed This Encounter  Procedures   CBC with Differential/Platelet    Standing Status:   Future    Number of Occurrences:   1    Expected Date:   04/19/2024    Expiration Date:   07/18/2024   Retic Panel    Standing Status:   Future    Number of Occurrences:   1    Expected Date:   04/19/2024    Expiration Date:   07/18/2024   Multiple Myeloma Panel (SPEP&IFE w/QIG)    Standing Status:   Future    Number of Occurrences:   1    Expected Date:   04/19/2024    Expiration Date:   07/18/2024   Kappa/lambda light chains    Standing Status:   Future    Number of Occurrences:   1    Expected Date:   04/19/2024    Expiration Date:   07/18/2024   Lactate dehydrogenase    Standing Status:   Future     Number of Occurrences:   1    Expected Date:   04/19/2024    Expiration Date:   07/18/2024   Haptoglobin    Standing Status:   Future    Number of Occurrences:   1    Expected Date:   04/19/2024    Expiration Date:   07/18/2024   Hepatic function panel    Standing Status:   Future    Number of Occurrences:   1    Expected Date:   04/19/2024    Expiration Date:   07/18/2024   Human parvovirus DNA detection by PCR    Standing Status:   Future    Number of Occurrences:   1    Expected Date:   04/19/2024    Expiration Date:   07/18/2024   Technologist smear review    Standing Status:   Future    Number of Occurrences:   1    Expected Date:   04/19/2024    Expiration Date:   04/19/2025    Clinical information::   anemia   Basic Metabolic Panel - Cancer Center Only    Standing Status:   Future    Number of Occurrences:   1    Expected Date:   04/19/2024    Expiration Date:  07/18/2024   Follow up  -Venofer  weekly x 3. Follow-up in 2 months. All questions were answered. The patient knows to call the clinic with any problems, questions or concerns.  Zelphia Cap, MD, PhD [facility] Hematology Oncology 04/19/2024    CHIEF COMPLAINTS/REASON FOR VISIT:  Follow-up for iron  deficiency anemia  HISTORY OF PRESENTING ILLNESS:  Martha Hamilton is a  73 y.o.  female with PMH listed below who was referred to me for evaluation of anemia Reviewed patient's recent labs that was done.  08/08/20 Labs revealed anemia with hemoglobin of 9.2, MCV 93 .   Reviewed patient's previous labs ordered by primary care physician's office, anemia is chronic onset , duration is since 2013, with baseline in 11s, worsened during the recent 6 months, after she stops taking oral iron  supplementation.   Associated signs and symptoms: Patient reports fatigue. denies SOB with exertion.  Denies weight loss, easy bruising, hematochezia, hemoptysis, hematuria. Context: History of GI bleeding: deneis               History of  gastric restriction surgery for GERD                Last colonoscopy: 05/01/21. Last Endoscopy 05/01/21  Remote history of right breast cancer, s/p lumpectomy with node. She denies any previous chemotherapy or any anti estrogen therapy.     Family history positive for brother with esophageal cancer, mother with RCC.  I have recommended genetic testing.  She wanted to defer and if she changes her mind she will update me..   INTERVAL HISTORY Martha Hamilton is a 73 y.o. female who has above history reviewed by me today presents for follow up visit for iron  deficiency anemia Patient has been on monthly vitamin B12 injection.  Today she reports feeling well.  Recent left foot surgery. + worse fatigue. Denies melena or blood in stool. During interval, patient has had a EGD done by Dr.Perry.  EGD showed esophageal stricture which was dilated.  Otherwise unremarkable.   Review of Systems  Constitutional:  Positive for fatigue. Negative for appetite change, chills and fever.  HENT:   Negative for hearing loss and voice change.   Eyes:  Negative for eye problems.  Respiratory:  Negative for chest tightness and cough.   Cardiovascular:  Negative for chest pain.  Gastrointestinal:  Negative for abdominal distention, abdominal pain and blood in stool.  Endocrine: Negative for hot flashes.  Genitourinary:  Negative for difficulty urinating and frequency.   Musculoskeletal:  Negative for arthralgias.  Skin:  Negative for itching and rash.  Neurological:  Negative for extremity weakness.  Hematological:  Negative for adenopathy.  Psychiatric/Behavioral:  Negative for confusion.      MEDICAL HISTORY:  Past Medical History:  Diagnosis Date   Allergic rhinitis    Allergy    Asthma    Breast cancer [facility]) 1996   right breast   Cataract Oct. 2021   Depression 1981   DM2 (diabetes mellitus, type 2) (HCC)    GERD (gastroesophageal reflux disease)    Hx of cardiovascular stress test    a. Lex  MV 2/14:  EF 69%, no ischemia   Hyperlipidemia    Hypertension    IDA (iron  deficiency anemia) 08/27/2020   Iron  deficiency anemia    Obesity    Osteoarthritis    hands   Thyroid  nodule 06/29/2023   Ulcer     SURGICAL HISTORY: Past Surgical History:  Procedure Laterality Date   APPENDECTOMY  1976   BREAST LUMPECTOMY Right 1996   right breast, lumpectomy w/nodes   CATARACT EXTRACTION W/PHACO Left 11/28/2020   Procedure: CATARACT EXTRACTION PHACO AND INTRAOCULAR LENS PLACEMENT (IOC) LEFT DIABETIC;  Surgeon: Mittie Gaskin, MD;  Location: [facility];  Service: Ophthalmology;  Laterality: Left;  5.02 01:07.4 7.4%   CATARACT EXTRACTION W/PHACO Right 12/12/2020   Procedure: CATARACT EXTRACTION PHACO AND INTRAOCULAR LENS PLACEMENT (IOC) RIGHT DIABETIC 5.22 00:59.2;  Surgeon: Mittie Gaskin, MD;  Location: [facility];  Service: Ophthalmology;  Laterality: Right;   CHONDROPLASTY Left 02/10/2018   Procedure: CHONDROPLASTY;  Surgeon: Leora Lynwood SAUNDERS, MD;  Location: [facility];  Service: Orthopedics;  Laterality: Left;   EYE SURGERY     FOOT SURGERY Left 07/2022   bone removal   GASTRIC RESTRICTION SURGERY     for reflux   HYSTEROSCOPY WITH D & C N/A 10/18/2012   Procedure: DILATATION AND CURETTAGE /HYSTEROSCOPY;  Surgeon: Winton Felt, MD;  Location: [facility];  Service: Gynecology;  Laterality: N/A;   KNEE ARTHROSCOPY WITH MEDIAL MENISECTOMY Left 02/10/2018   Procedure: KNEE ARTHROSCOPY WITH MEDIAL and LATERAL MENISECTOMY;  Surgeon: Leora Lynwood SAUNDERS, MD;  Location: [facility];  Service: Orthopedics;  Laterality: Left;   POLYPECTOMY N/A 10/18/2012   Procedure: POLYPECTOMY;  Surgeon: Winton Felt, MD;  Location: [facility];  Service: Gynecology;  Laterality: N/A;   SYNOVECTOMY Left 02/10/2018   Procedure: SYNOVECTOMY;  Surgeon: Leora Lynwood SAUNDERS, MD;  Location: [facility];  Service: Orthopedics;  Laterality: Left;    SOCIAL HISTORY: Social History   Socioeconomic  History   Marital status: Single    Spouse name: Not on file   Number of children: 0   Years of education: Not on file   Highest education level: Some college, no degree  Occupational History   Occupation: retired    Associate Professor: RETIRED  Tobacco Use   Smoking status: Never   Smokeless tobacco: Never   Tobacco comments:    Never smoked  Vaping Use   Vaping status: Never Used  Substance and Sexual Activity   Alcohol use: Never   Drug use: Never   Sexual activity: Not Currently    Birth control/protection: Post-menopausal  Other Topics Concern   Not on file  Social History Narrative   Lives with, and cares for her elderly mother.   Social Drivers of Corporate investment banker Strain: Low Risk  (02/01/2024)   Overall Financial Resource Strain (CARDIA)    Difficulty of Paying Living Expenses: Not very hard  Food Insecurity: No Food Insecurity (02/01/2024)   Hunger Vital Sign    Worried About Running Out of Food in the Last Year: Never true    Ran Out of Food in the Last Year: Never true  Transportation Needs: No Transportation Needs (02/01/2024)   PRAPARE - Administrator, Civil Service (Medical): No    Lack of Transportation (Non-Medical): No  Physical Activity: Inactive (02/01/2024)   Exercise Vital Sign    Days of Exercise per Week: 0 days    Minutes of Exercise per Session: Not on file  Stress: No Stress Concern Present (02/01/2024)   [employer] of Occupational Health - Occupational Stress Questionnaire    Feeling of Stress: Not at all  Social Connections: Moderately Integrated (02/01/2024)   Social Connection and Isolation Panel    Frequency of Communication with Friends and Family: More than three times a week    Frequency of Social Gatherings with Friends and Family: More than  three times a week    Attends Religious Services: More than 4 times per year    Active Member of Clubs or Organizations: Yes    Attends Banker Meetings: More than 4  times per year    Marital Status: Never married  Intimate Partner Violence: Not At Risk (01/22/2023)   Humiliation, Afraid, Rape, and Kick questionnaire    Fear of Current or Ex-Partner: No    Emotionally Abused: No    Physically Abused: No    Sexually Abused: No    FAMILY HISTORY: Family History  Problem Relation Age of Onset   Diabetes Mother    Heart disease Mother        Pacemaker, CHF   Hypertension Mother    Kidney cancer Mother    Cancer Mother    Stroke Father    CAD Father 69       Died with MI   Heart disease Father    Esophageal cancer Brother    Diabetes Brother    Heart disease Sister    Cancer Brother    Diabetes Sister    Colon cancer Neg Hx    Rectal cancer Neg Hx    Stomach cancer Neg Hx    Pancreatic cancer Neg Hx     ALLERGIES:  is allergic to cholestyramine, simvastatin , lipitor [atorvastatin], and niacin.  MEDICATIONS:  Current Outpatient Medications  Medication Sig Dispense Refill   Accu-Chek Softclix Lancets lancets TEST BLOOD SUGAR EVERY DAY 100 each 1   acetaminophen  (TYLENOL ) 500 MG tablet Take 1,000 mg by mouth every 6 (six) hours as needed for mild pain.      Alcohol Swabs (DROPSAFE ALCOHOL PREP) 70 % PADS USE ONE TIME DAILY AS DIRECTED 100 each 1   amitriptyline  (ELAVIL ) 25 MG tablet Take 1 tablet (25 mg total) by mouth at bedtime. 90 tablet 2   aspirin 81 MG tablet Take 81 mg by mouth at bedtime.      Blood Glucose Calibration (ACCU-CHEK AVIVA) SOLN USE TO CALIBRATE METER AS DIRECTED 1 each 2   Blood Glucose Monitoring Suppl (ACCU-CHEK AVIVA PLUS) w/Device KIT Use to check blood sugar once daily for DM (dx. E11.9) 1 kit 0   calcium elemental as carbonate (BARIATRIC TUMS ULTRA) 400 MG chewable tablet Chew 1,000 mg by mouth daily as needed for heartburn.     Camphor-Menthol-Methyl Sal (SALONPAS) 3.07-19-08 % PTCH Apply topically.     carvedilol  (COREG ) 25 MG tablet Take 1 tablet (25 mg total) by mouth 2 (two) times daily. 180 tablet 3    Cholecalciferol (VITAMIN D ) 2000 units tablet Take 2,000 Units by mouth daily.     Docusate Sodium  (STOOL SOFTENER) 100 MG capsule Take 100 mg by mouth in the morning and at bedtime.     ezetimibe  (ZETIA ) 10 MG tablet Take 1 tablet (10 mg total) by mouth daily. 90 tablet 2   fenofibrate  (TRICOR ) 145 MG tablet TAKE 1 TABLET EVERY DAY 90 tablet 1   gabapentin  (NEURONTIN ) 300 MG capsule TAKE 1 CAPSULE THREE TIMES DAILY AS NEEDED 90 capsule 3   glucose blood (ACCU-CHEK AVIVA PLUS) test strip TEST BLOOD SUGAR EVERY DAY FOR DIABETES 100 strip 1   loratadine (CLARITIN) 10 MG tablet Take 10 mg by mouth daily as needed for allergies.     losartan  (COZAAR ) 100 MG tablet Take 1 tablet (100 mg total) by mouth daily. 90 tablet 3   meclizine  (ANTIVERT ) 25 MG tablet Take 1 tablet (25 mg total) by  mouth 3 (three) times daily as needed for dizziness. 90 tablet 3   metFORMIN  (GLUCOPHAGE ) 1000 MG tablet TAKE 1/2 TABLET TWICE DAILY WITH MEALS 90 tablet 0   NEXLETOL  180 MG TABS TAKE 1 TABLET EVERY DAY 90 tablet 3   omeprazole  (PRILOSEC) 40 MG capsule TAKE 1 CAPSULE IN THE MORNING AND AT BEDTIME. 180 capsule 1   ondansetron  (ZOFRAN ) 4 MG tablet TAKE 1 TABLET EVERY 4 HOURS AS NEEDED FOR NAUSEA AND VOMITING 90 tablet 0   SALINE NASAL SPRAY NA Place 1 spray into the nose daily as needed (congestion).      tirzepatide  (MOUNJARO ) 10 MG/0.5ML Pen Inject 10 mg into the skin once a week. 2 mL 0   Cyanocobalamin  (B-12) 2500 MCG SUBL Place 1 tablet under the tongue daily. (Patient not taking: Reported on 04/19/2024) 30 tablet 5   No current facility-administered medications for this visit.     PHYSICAL EXAMINATION: ECOG PERFORMANCE STATUS: 1 - Symptomatic but completely ambulatory Vitals:   04/19/24 1337  BP: (!) 145/70  Pulse: 100  Resp: 18  Temp: 98.4 F (36.9 C)  SpO2: 98%   Filed Weights   04/19/24 1337  Weight: 166 lb 14.4 oz (75.7 kg)    Physical Exam Constitutional:      General: She is not in acute  distress. HENT:     Head: Normocephalic and atraumatic.  Eyes:     General: No scleral icterus. Cardiovascular:     Rate and Rhythm: Normal rate.  Pulmonary:     Effort: Pulmonary effort is normal. No respiratory distress.     Breath sounds: No wheezing.  Abdominal:     General: Bowel sounds are normal. There is no distension.     Palpations: Abdomen is soft.  Musculoskeletal:        General: Normal range of motion.     Cervical back: Normal range of motion and neck supple.  Skin:    Findings: No erythema or rash.  Neurological:     Mental Status: She is alert and oriented to person, place, and time. Mental status is at baseline.  Psychiatric:        Mood and Affect: Mood normal.      LABORATORY DATA:  I have reviewed the data as listed    Latest Ref Rng & Units 04/19/2024    2:02 PM 04/15/2024    8:44 AM 02/01/2024    7:31 AM  CBC  WBC 4.0 - 10.5 K/uL 5.2  3.6  4.4   Hemoglobin 12.0 - 15.0 g/dL 8.8  8.8  88.9   Hematocrit 36.0 - 46.0 % 27.1  26.4  32.9   Platelets 150 - 400 K/uL 362  338  348.0       Latest Ref Rng & Units 04/19/2024    2:02 PM 04/19/2024    2:01 PM 02/01/2024    7:31 AM  CMP  Glucose 70 - 99 mg/dL  890  873   BUN 8 - 23 mg/dL  19  23   Creatinine 9.55 - 1.00 mg/dL  8.85  9.05   Sodium 864 - 145 mmol/L  133  137   Potassium 3.5 - 5.1 mmol/L  4.7  4.6   Chloride 98 - 111 mmol/L  100  100   CO2 22 - 32 mmol/L  20  24   Calcium 8.9 - 10.3 mg/dL  9.1  9.4   Total Protein 6.5 - 8.1 g/dL 6.6   6.4   Total Bilirubin  0.0 - 1.2 mg/dL 0.7   0.3   Alkaline Phos 38 - 126 U/L 38   48   AST 15 - 41 U/L 32   16   ALT 0 - 44 U/L 12   11      Iron /TIBC/Ferritin/ %Sat    Component Value Date/Time   IRON  46 04/15/2024 0844   TIBC 361 04/15/2024 0844   FERRITIN 37 04/15/2024 0844   IRONPCTSAT 13 04/15/2024 0844

## 2024-04-19 NOTE — Assessment & Plan Note (Addendum)
 Iron  deficiency anemia Chronic blood loss vs malabsorption.  She has had GI work up in the past.  Labs reviewed and discussed with patient.   Lab Results  Component Value Date   HGB 8.8 (L) 04/15/2024   TIBC 361 04/15/2024   IRONPCTSAT 13 04/15/2024   FERRITIN 37 04/15/2024   Patient is status post IV Venofer  treatments.  Hemoglobin further decreased since currently iron  panel within normal limits no significant iron  deficiency.

## 2024-04-20 LAB — KAPPA/LAMBDA LIGHT CHAINS
Kappa free light chain: 37.8 mg/L — ABNORMAL HIGH (ref 3.3–19.4)
Kappa, lambda light chain ratio: 1.42 (ref 0.26–1.65)
Lambda free light chains: 26.7 mg/L — ABNORMAL HIGH (ref 5.7–26.3)

## 2024-04-20 LAB — HAPTOGLOBIN: Haptoglobin: 140 mg/dL (ref 42–346)

## 2024-04-21 ENCOUNTER — Other Ambulatory Visit: Payer: Self-pay | Admitting: Family Medicine

## 2024-04-21 DIAGNOSIS — E119 Type 2 diabetes mellitus without complications: Secondary | ICD-10-CM

## 2024-04-21 LAB — HUMAN PARVOVIRUS DNA DETECTION BY PCR: Parvovirus B19, PCR: NEGATIVE

## 2024-04-22 ENCOUNTER — Encounter: Payer: Self-pay | Admitting: Oncology

## 2024-04-22 LAB — MULTIPLE MYELOMA PANEL, SERUM
Albumin SerPl Elph-Mcnc: 2.9 g/dL (ref 2.9–4.4)
Albumin/Glob SerPl: 0.9 (ref 0.7–1.7)
Alpha 1: 0.3 g/dL (ref 0.0–0.4)
Alpha2 Glob SerPl Elph-Mcnc: 0.9 g/dL (ref 0.4–1.0)
B-Globulin SerPl Elph-Mcnc: 1.3 g/dL (ref 0.7–1.3)
Gamma Glob SerPl Elph-Mcnc: 0.8 g/dL (ref 0.4–1.8)
Globulin, Total: 3.3 g/dL (ref 2.2–3.9)
IgA: 450 mg/dL — ABNORMAL HIGH (ref 64–422)
IgG (Immunoglobin G), Serum: 916 mg/dL (ref 586–1602)
IgM (Immunoglobulin M), Srm: 60 mg/dL (ref 26–217)
Total Protein ELP: 6.2 g/dL (ref 6.0–8.5)

## 2024-04-26 ENCOUNTER — Ambulatory Visit: Admitting: Podiatry

## 2024-04-26 ENCOUNTER — Ambulatory Visit

## 2024-04-26 ENCOUNTER — Encounter: Payer: Self-pay | Admitting: Podiatry

## 2024-04-26 VITALS — Ht 59.0 in | Wt 166.9 lb

## 2024-04-26 DIAGNOSIS — M10071 Idiopathic gout, right ankle and foot: Secondary | ICD-10-CM

## 2024-04-26 DIAGNOSIS — M65971 Unspecified synovitis and tenosynovitis, right ankle and foot: Secondary | ICD-10-CM | POA: Diagnosis not present

## 2024-04-27 ENCOUNTER — Telehealth: Payer: Self-pay | Admitting: Podiatry

## 2024-04-27 ENCOUNTER — Other Ambulatory Visit: Payer: Self-pay | Admitting: Podiatry

## 2024-04-27 DIAGNOSIS — M10071 Idiopathic gout, right ankle and foot: Secondary | ICD-10-CM | POA: Diagnosis not present

## 2024-04-27 MED ORDER — COLCHICINE 0.6 MG PO TABS: 0.6000 mg | ORAL_TABLET | Freq: Every day | ORAL | 0 refills | Status: AC

## 2024-04-27 NOTE — Telephone Encounter (Signed)
 Patient saw Dr Janit yesterday. She states she was given RX for labwork and RX was suppose to be sent to [facility] [address]. Patient states nothing sent to [facility] yet.

## 2024-04-28 LAB — URIC ACID: Uric Acid: 9.8 mg/dL — ABNORMAL HIGH (ref 3.1–7.9)

## 2024-04-29 ENCOUNTER — Ambulatory Visit: Admitting: Podiatry

## 2024-05-03 ENCOUNTER — Ambulatory Visit: Payer: Self-pay | Admitting: Oncology

## 2024-05-04 ENCOUNTER — Encounter: Payer: Self-pay | Admitting: Oncology

## 2024-05-04 ENCOUNTER — Other Ambulatory Visit: Payer: Self-pay

## 2024-05-04 DIAGNOSIS — D649 Anemia, unspecified: Secondary | ICD-10-CM

## 2024-05-04 DIAGNOSIS — M5412 Radiculopathy, cervical region: Secondary | ICD-10-CM | POA: Diagnosis not present

## 2024-05-04 DIAGNOSIS — M4802 Spinal stenosis, cervical region: Secondary | ICD-10-CM | POA: Diagnosis not present

## 2024-05-04 NOTE — Telephone Encounter (Signed)
-----   Message from Zelphia Cap sent at 05/03/2024  8:42 AM EDT ----- Please let patient know that I have reviewed the work up results. Anemia could be due to chronic kidney disease. I recommend to further increase iron  store. Please arrange pt to get IV venofer  weekly  x3.  Also lab encounter in 4 weeks cbc no diff.  Follow up in 8 weeks lab prior to MD +/- Venofer  +/- retacrit. - cbc iron  tibc ferritin retic panel. Thanks.  ----- Message ----- From: Rebecka, Lab In [place] Sent: 04/19/2024   2:18 PM EDT To: Zelphia Cap, MD

## 2024-05-04 NOTE — Telephone Encounter (Signed)
 Called patient and informed her that Dr. Babara reviewed the work up results. Anemia could be due to chronic kidney disease. Dr. Babara recommends to further increase iron  store so we will arrange her to get IV venofer  weekly x3.  Also lab encounter in 4 weeks cbc no diff.  Follow up in 8 weeks lab prior to MD +/- Venofer  +/- retacrit. - cbc iron  tibc ferritin retic panel.

## 2024-05-04 NOTE — Telephone Encounter (Signed)
 Rosina will you please arrange patient to get IV venofer  weekly x3.  Also lab encounter in 4 weeks cbc no diff.  Follow up in 8 weeks lab prior to MD +/- Venofer  +/- retacrit. - cbc iron  tibc ferritin retic panel.

## 2024-05-05 ENCOUNTER — Encounter: Payer: Self-pay | Admitting: Cardiology

## 2024-05-05 ENCOUNTER — Encounter: Payer: Self-pay | Admitting: Family Medicine

## 2024-05-05 ENCOUNTER — Telehealth: Payer: Self-pay | Admitting: *Deleted

## 2024-05-05 DIAGNOSIS — E119 Type 2 diabetes mellitus without complications: Secondary | ICD-10-CM

## 2024-05-05 MED ORDER — TIRZEPATIDE 7.5 MG/0.5ML ~~LOC~~ SOAJ: 7.5000 mg | SUBCUTANEOUS | 2 refills | Status: DC

## 2024-05-05 NOTE — Telephone Encounter (Signed)
 Sent mychart letting pt know  ?

## 2024-05-05 NOTE — Assessment & Plan Note (Signed)
 Did not tolerate mounjaro  10 mg  We will drop back to 7.5

## 2024-05-05 NOTE — Telephone Encounter (Signed)
 Pt sent a message saying:  I  have tried taking mounjaro  10 mg for 7 weeks and I am still having trouble eating.  I feel very nausea in the morning and can not eat.  I have cut down and can not eat but a tablespoon of vegetables at lunch no bread and no meats.  I wonder if we can drop back to the 7.5 mg and see if I would be able to eat better for strength. I am still having trouble with my blood pressure dropping when  I am on my feet a lot. I am going to get in touch with Dr. Darliss about this. He is my heart doctor.

## 2024-05-05 NOTE — Telephone Encounter (Signed)
 I sent in the 7.5  Let us  know If symptoms do not improve

## 2024-05-06 MED ORDER — LOSARTAN POTASSIUM 50 MG PO TABS: 50.0000 mg | ORAL_TABLET | Freq: Every day | ORAL | 3 refills | Status: DC

## 2024-05-09 ENCOUNTER — Inpatient Hospital Stay

## 2024-05-09 VITALS — BP 115/53 | HR 89 | Temp 98.0°F | Resp 18

## 2024-05-09 DIAGNOSIS — M65971 Unspecified synovitis and tenosynovitis, right ankle and foot: Secondary | ICD-10-CM

## 2024-05-09 DIAGNOSIS — Z853 Personal history of malignant neoplasm of breast: Secondary | ICD-10-CM | POA: Diagnosis not present

## 2024-05-09 DIAGNOSIS — D509 Iron deficiency anemia, unspecified: Secondary | ICD-10-CM | POA: Diagnosis not present

## 2024-05-09 DIAGNOSIS — D508 Other iron deficiency anemias: Secondary | ICD-10-CM

## 2024-05-09 MED ORDER — BETAMETHASONE SOD PHOS & ACET 6 (3-3) MG/ML IJ SUSP
3.0000 mg | Freq: Once | INTRAMUSCULAR | Status: AC
  Administered 2024-05-09: 3 mg via INTRA_ARTICULAR

## 2024-05-09 MED ORDER — IRON SUCROSE 20 MG/ML IV SOLN
200.0000 mg | Freq: Once | INTRAVENOUS | Status: AC
  Administered 2024-05-09: 200 mg via INTRAVENOUS
  Filled 2024-05-09: qty 10

## 2024-05-09 NOTE — Patient Instructions (Signed)

## 2024-05-09 NOTE — Progress Notes (Signed)
 Chief Complaint  Patient presents with   Toe Pain    Pt is here due to right great toe pain, has been bothering her for 2 weeks.    HPI: 73 y.o. female presenting today for evaluation of acute gout to the right great toe  Past Medical History:  Diagnosis Date   Allergic rhinitis    Allergy    Asthma    Breast cancer [facility]) 1996   right breast   Cataract Oct. 2021   Depression 1981   DM2 (diabetes mellitus, type 2) (HCC)    GERD (gastroesophageal reflux disease)    Hx of cardiovascular stress test    a. Lex MV 2/14:  EF 69%, no ischemia   Hyperlipidemia    Hypertension    IDA (iron  deficiency anemia) 08/27/2020   Iron  deficiency anemia    Obesity    Osteoarthritis    hands   Thyroid  nodule 06/29/2023   Ulcer     Past Surgical History:  Procedure Laterality Date   APPENDECTOMY  1976   BREAST LUMPECTOMY Right 1996   right breast, lumpectomy w/nodes   CATARACT EXTRACTION W/PHACO Left 11/28/2020   Procedure: CATARACT EXTRACTION PHACO AND INTRAOCULAR LENS PLACEMENT (IOC) LEFT DIABETIC;  Surgeon: Mittie Gaskin, MD;  Location: [facility];  Service: Ophthalmology;  Laterality: Left;  5.02 01:07.4 7.4%   CATARACT EXTRACTION W/PHACO Right 12/12/2020   Procedure: CATARACT EXTRACTION PHACO AND INTRAOCULAR LENS PLACEMENT (IOC) RIGHT DIABETIC 5.22 00:59.2;  Surgeon: Mittie Gaskin, MD;  Location: [facility];  Service: Ophthalmology;  Laterality: Right;   CHONDROPLASTY Left 02/10/2018   Procedure: CHONDROPLASTY;  Surgeon: Leora Lynwood SAUNDERS, MD;  Location: [facility];  Service: Orthopedics;  Laterality: Left;   EYE SURGERY     FOOT SURGERY Left 07/2022   bone removal   GASTRIC RESTRICTION SURGERY     for reflux   HYSTEROSCOPY WITH D & C N/A 10/18/2012   Procedure: DILATATION AND CURETTAGE /HYSTEROSCOPY;  Surgeon: Winton Felt, MD;  Location: [facility];  Service: Gynecology;  Laterality: N/A;   KNEE ARTHROSCOPY WITH MEDIAL MENISECTOMY Left 02/10/2018    Procedure: KNEE ARTHROSCOPY WITH MEDIAL and LATERAL MENISECTOMY;  Surgeon: Leora Lynwood SAUNDERS, MD;  Location: [facility];  Service: Orthopedics;  Laterality: Left;   POLYPECTOMY N/A 10/18/2012   Procedure: POLYPECTOMY;  Surgeon: Winton Felt, MD;  Location: [facility];  Service: Gynecology;  Laterality: N/A;   SYNOVECTOMY Left 02/10/2018   Procedure: SYNOVECTOMY;  Surgeon: Leora Lynwood SAUNDERS, MD;  Location: [facility];  Service: Orthopedics;  Laterality: Left;    Allergies  Allergen Reactions   Cholestyramine Other (See Comments)    REACTION: Muscles tightened up, drew up.   Simvastatin  Other (See Comments)    Muscular pain   Lipitor [Atorvastatin] Other (See Comments)    Muscle cramping   Niacin Nausea And Vomiting     Physical Exam: General: The patient is alert and oriented x3 in no acute distress.  Dermatology: Skin is warm, dry and supple bilateral lower extremities.   Vascular: Palpable pedal pulses bilaterally. Capillary refill within normal limits.  No appreciable edema.  No erythema.  Neurological: Grossly intact via light touch  Musculoskeletal Exam: Erythema with edema and tenderness with palpation range of motion of first MTP of the right foot consistent with acute gout  Radiographic Exam RT foot 04/26/2024:  Normal osseous mineralization. Joint spaces preserved.  No fractures or osseous irregularities noted.  Impression: Negative  Assessment/Plan of Care: 1.  Acute gout right first  MTP  -Patient evaluated -Injection of 0.5 cc Celestone  Soluspan injected in the first MTP right foot -Order placed for uric acid -Recommend good supportive tennis shoes and sneakers.  Refrain from going barefoot -Discussed the pathology and etiology of gout.  Avoid foods that are high in purines that could elicit or exacerbate the gout. -Prescription for colchicine 0.6 mg daily PRN -Return to clinic PRN       Thresa EMERSON Sar, DPM [facility]  Dr. Thresa EMERSON Sar, DPM    [address] [place], [place] [address]                Office [phone]  Fax [phone]

## 2024-05-11 ENCOUNTER — Other Ambulatory Visit: Payer: Self-pay | Admitting: Family Medicine

## 2024-05-12 ENCOUNTER — Ambulatory Visit

## 2024-05-12 VITALS — BP 92/58 | HR 107 | Ht 59.0 in | Wt 166.0 lb

## 2024-05-12 DIAGNOSIS — Z0000 Encounter for general adult medical examination without abnormal findings: Secondary | ICD-10-CM

## 2024-05-12 NOTE — Patient Instructions (Signed)
 Ms. Grandpre,  Thank you for taking the time for your Medicare Wellness Visit. I appreciate your continued commitment to your health goals. Please review the care plan we discussed, and feel free to reach out if I can assist you further.  Medicare recommends these wellness visits once per year to help you and your care team stay ahead of potential health issues. These visits are designed to focus on prevention, allowing your provider to concentrate on managing your acute and chronic conditions during your regular appointments.  Please note that Annual Wellness Visits do not include a physical exam. Some assessments may be limited, especially if the visit was conducted virtually. If needed, we may recommend a separate in-person follow-up with your provider.  Ongoing Care Seeing your primary care provider every 3 to 6 months helps us  monitor your health and provide consistent, personalized care.   Referrals If a referral was made during today's visit and you haven't received any updates within two weeks, please contact the referred provider directly to check on the status.  Recommended Screenings:  Health Maintenance  Topic Date Due   Complete foot exam   09/18/2022   COVID-19 Vaccine (4 - 2025-26 season) 03/14/2024   Breast Cancer Screening  07/05/2024   Stool Blood Test  02/02/2026*   Hemoglobin A1C  08/03/2024   Yearly kidney health urinalysis for diabetes  10/20/2024   Eye exam for diabetics  11/10/2024   Medicare Annual Wellness Visit  02/03/2025   Yearly kidney function blood test for diabetes  04/19/2025   Colon Cancer Screening  05/01/2028   Pneumococcal Vaccine for age over 69  Completed   Flu Shot  Completed   DEXA scan (bone density measurement)  Completed   Hepatitis C Screening  Completed   Zoster (Shingles) Vaccine  Completed   Meningitis B Vaccine  Aged Out   DTaP/Tdap/Td vaccine  Discontinued  *Topic was postponed. The date shown is not the original due date.        05/12/2024    3:51 PM  Advanced Directives  Does Patient Have a Medical Advance Directive? Yes  Type of Estate Agent of [place];Living will  Does patient want to make changes to medical advance directive? No - Patient declined  Copy of Healthcare Power of Attorney in Chart? Yes - validated most recent copy scanned in chart (See row information)  Would patient like information on creating a medical advance directive? No - Patient declined   Advance Care Planning is important because it: Ensures you receive medical care that aligns with your values, goals, and preferences. Provides guidance to your family and loved ones, reducing the emotional burden of decision-making during critical moments.  Vision: Annual vision screenings are recommended for early detection of glaucoma, cataracts, and diabetic retinopathy. These exams can also reveal signs of chronic conditions such as diabetes and high blood pressure.  Dental: Annual dental screenings help detect early signs of oral cancer, gum disease, and other conditions linked to overall health, including heart disease and diabetes.  Please see the attached documents for additional preventive care recommendations.

## 2024-05-12 NOTE — Progress Notes (Signed)
 Because this visit was a virtual/telehealth visit,  certain criteria was not obtained, such a blood pressure, CBG if applicable, and timed get up and go. Any medications not marked as taking were not mentioned during the medication reconciliation part of the visit. Any vitals not documented were not able to be obtained due to this being a telehealth visit or patient was unable to self-report a recent blood pressure reading due to a lack of equipment at home via telehealth. Vitals that have been documented are verbally provided by the patient.  This visit was performed by a medical professional under my direct supervision. I was immediately available for consultation/collaboration. I have reviewed and agree with the Annual Wellness Visit documentation.  Subjective:   Rayya Yagi is a 73 y.o. who presents for a Medicare Wellness preventive visit.  As a reminder, Annual Wellness Visits don't include a physical exam, and some assessments may be limited, especially if this visit is performed virtually. We may recommend an in-person follow-up visit with your provider if needed.  Visit Complete: Virtual I connected with  Ronal Cathlean Barba on 05/12/24 by a audio enabled telemedicine application and verified that I am speaking with the correct person using two identifiers.  Patient Location: Home  Provider Location: Home Office  I discussed the limitations of evaluation and management by telemedicine. The patient expressed understanding and agreed to proceed.  Vital Signs: Because this visit was a virtual/telehealth visit, some criteria may be missing or patient reported. Any vitals not documented were not able to be obtained and vitals that have been documented are patient reported.  VideoDeclined- This patient declined Librarian, academic. Therefore the visit was completed with audio only.  Persons Participating in Visit: Patient.  AWV Questionnaire: Yes: Patient  Medicare AWV questionnaire was completed by the patient on 05/08/2024; I have confirmed that all information answered by patient is correct and no changes since this date.  Cardiac Risk Factors include: advanced age (>41men, >27 women);obesity (BMI >30kg/m2);diabetes mellitus;hypertension;dyslipidemia     Objective:    Today's Vitals   05/08/24 1819 05/12/24 1553  BP:  (!) 92/58  Pulse:  (!) 107  Weight:  166 lb (75.3 kg)  Height:  4' 11 (1.499 m)  PainSc: 0-No pain    Body mass index is 33.53 kg/m.     05/12/2024    3:51 PM 05/09/2024    1:21 PM 01/12/2024    1:23 PM 12/22/2023    1:17 PM 12/20/2023   10:38 AM 12/15/2023    1:24 PM 03/11/2023    1:27 PM  Advanced Directives  Does Patient Have a Medical Advance Directive? Yes Yes No Yes Yes Yes Yes  Type of Estate Agent of [place];Living will Healthcare Power of [place];Living will Healthcare Power of [place];Living will Healthcare Power of [place];Living will Healthcare Power of [place];Living will Living will;Healthcare Power of Attorney Living will;Healthcare Power of Attorney  Does patient want to make changes to medical advance directive? No - Patient declined No - Patient declined No - Patient declined No - Patient declined  Yes (ED - Information included in AVS)   Copy of Healthcare Power of Attorney in Chart? Yes - validated most recent copy scanned in chart (See row information) Yes - validated most recent copy scanned in chart (See row information)  Yes - validated most recent copy scanned in chart (See row information)   Yes - validated most recent copy scanned in chart (See row information)  Would patient  like information on creating a medical advance directive? No - Patient declined No - Patient declined No - Patient declined        Current Medications (verified) Outpatient Encounter Medications as of 05/12/2024  Medication Sig   Accu-Chek Softclix Lancets lancets TEST BLOOD SUGAR EVERY DAY    acetaminophen  (TYLENOL ) 500 MG tablet Take 1,000 mg by mouth every 6 (six) hours as needed for mild pain.    Alcohol Swabs (DROPSAFE ALCOHOL PREP) 70 % PADS USE ONE TIME DAILY AS DIRECTED   amitriptyline  (ELAVIL ) 25 MG tablet Take 1 tablet (25 mg total) by mouth at bedtime.   aspirin 81 MG tablet Take 81 mg by mouth at bedtime.    Blood Glucose Calibration (ACCU-CHEK AVIVA) SOLN USE TO CALIBRATE METER AS DIRECTED   Blood Glucose Monitoring Suppl (ACCU-CHEK AVIVA PLUS) w/Device KIT Use to check blood sugar once daily for DM (dx. E11.9)   calcium elemental as carbonate (BARIATRIC TUMS ULTRA) 400 MG chewable tablet Chew 1,000 mg by mouth daily as needed for heartburn.   Camphor-Menthol-Methyl Sal (SALONPAS) 3.07-19-08 % PTCH Apply topically.   carvedilol  (COREG ) 25 MG tablet Take 1 tablet (25 mg total) by mouth 2 (two) times daily.   Cholecalciferol (VITAMIN D ) 2000 units tablet Take 2,000 Units by mouth daily.   colchicine 0.6 MG tablet Take 1 tablet (0.6 mg total) by mouth daily.   Docusate Sodium  (STOOL SOFTENER) 100 MG capsule Take 100 mg by mouth in the morning and at bedtime.   ezetimibe  (ZETIA ) 10 MG tablet Take 1 tablet (10 mg total) by mouth daily.   fenofibrate  (TRICOR ) 145 MG tablet TAKE 1 TABLET EVERY DAY   gabapentin  (NEURONTIN ) 300 MG capsule TAKE 1 CAPSULE THREE TIMES DAILY AS NEEDED   glucose blood (ACCU-CHEK AVIVA PLUS) test strip TEST BLOOD SUGAR EVERY DAY FOR DIABETES   loratadine (CLARITIN) 10 MG tablet Take 10 mg by mouth daily as needed for allergies.   losartan  (COZAAR ) 50 MG tablet Take 1 tablet (50 mg total) by mouth daily.   meclizine  (ANTIVERT ) 25 MG tablet Take 1 tablet (25 mg total) by mouth 3 (three) times daily as needed for dizziness.   metFORMIN  (GLUCOPHAGE ) 1000 MG tablet TAKE 1/2 TABLET TWICE DAILY WITH MEALS   NEXLETOL  180 MG TABS TAKE 1 TABLET EVERY DAY   omeprazole  (PRILOSEC) 40 MG capsule TAKE 1 CAPSULE IN THE MORNING AND AT BEDTIME.   ondansetron  (ZOFRAN ) 4 MG  tablet TAKE 1 TABLET EVERY 4 HOURS AS NEEDED FOR NAUSEA AND VOMITING   SALINE NASAL SPRAY NA Place 1 spray into the nose daily as needed (congestion).    tirzepatide  (MOUNJARO ) 7.5 MG/0.5ML Pen Inject 7.5 mg into the skin once a week.   Cyanocobalamin  (B-12) 2500 MCG SUBL Place 1 tablet under the tongue daily. (Patient not taking: Reported on 05/12/2024)   No facility-administered encounter medications on file as of 05/12/2024.    Allergies (verified) Cholestyramine, Simvastatin , Lipitor [atorvastatin], and Niacin   History: Past Medical History:  Diagnosis Date   Allergic rhinitis    Allergy    Asthma    Breast cancer [facility]) 1996   right breast   Cataract Oct. 2021   Depression 1981   DM2 (diabetes mellitus, type 2) (HCC)    GERD (gastroesophageal reflux disease)    Hx of cardiovascular stress test    a. Lex MV 2/14:  EF 69%, no ischemia   Hyperlipidemia    Hypertension    IDA (iron  deficiency anemia) 08/27/2020  Iron  deficiency anemia    Obesity    Osteoarthritis    hands   Thyroid  nodule 06/29/2023   Ulcer    Past Surgical History:  Procedure Laterality Date   APPENDECTOMY  1976   BREAST LUMPECTOMY Right 1996   right breast, lumpectomy w/nodes   CATARACT EXTRACTION W/PHACO Left 11/28/2020   Procedure: CATARACT EXTRACTION PHACO AND INTRAOCULAR LENS PLACEMENT (IOC) LEFT DIABETIC;  Surgeon: Mittie Gaskin, MD;  Location: [facility];  Service: Ophthalmology;  Laterality: Left;  5.02 01:07.4 7.4%   CATARACT EXTRACTION W/PHACO Right 12/12/2020   Procedure: CATARACT EXTRACTION PHACO AND INTRAOCULAR LENS PLACEMENT (IOC) RIGHT DIABETIC 5.22 00:59.2;  Surgeon: Mittie Gaskin, MD;  Location: [facility];  Service: Ophthalmology;  Laterality: Right;   CHONDROPLASTY Left 02/10/2018   Procedure: CHONDROPLASTY;  Surgeon: Leora Lynwood SAUNDERS, MD;  Location: [facility];  Service: Orthopedics;  Laterality: Left;   EYE SURGERY     FOOT SURGERY Left 07/2022    bone removal   GASTRIC RESTRICTION SURGERY     for reflux   HYSTEROSCOPY WITH D & C N/A 10/18/2012   Procedure: DILATATION AND CURETTAGE /HYSTEROSCOPY;  Surgeon: Winton Felt, MD;  Location: [facility];  Service: Gynecology;  Laterality: N/A;   KNEE ARTHROSCOPY WITH MEDIAL MENISECTOMY Left 02/10/2018   Procedure: KNEE ARTHROSCOPY WITH MEDIAL and LATERAL MENISECTOMY;  Surgeon: Leora Lynwood SAUNDERS, MD;  Location: [facility];  Service: Orthopedics;  Laterality: Left;   POLYPECTOMY N/A 10/18/2012   Procedure: POLYPECTOMY;  Surgeon: Winton Felt, MD;  Location: [facility];  Service: Gynecology;  Laterality: N/A;   SYNOVECTOMY Left 02/10/2018   Procedure: SYNOVECTOMY;  Surgeon: Leora Lynwood SAUNDERS, MD;  Location: [facility];  Service: Orthopedics;  Laterality: Left;   Family History  Problem Relation Age of Onset   Diabetes Mother    Heart disease Mother        Pacemaker, CHF   Hypertension Mother    Kidney cancer Mother    Cancer Mother    Stroke Father    CAD Father 69       Died with MI   Heart disease Father    Esophageal cancer Brother    Diabetes Brother    Heart disease Sister    Cancer Brother    Diabetes Sister    Colon cancer Neg Hx    Rectal cancer Neg Hx    Stomach cancer Neg Hx    Pancreatic cancer Neg Hx    Social History   Socioeconomic History   Marital status: Single    Spouse name: Not on file   Number of children: 0   Years of education: Not on file   Highest education level: Some college, no degree  Occupational History   Occupation: retired    Associate Professor: RETIRED  Tobacco Use   Smoking status: Never   Smokeless tobacco: Never   Tobacco comments:    Never smoked  Vaping Use   Vaping status: Never Used  Substance and Sexual Activity   Alcohol use: Never   Drug use: Never   Sexual activity: Not Currently    Birth control/protection: Post-menopausal  Other Topics Concern   Not on file  Social History Narrative   Lives with, and cares for her elderly mother.    Social Drivers of Health   Financial Resource Strain: Medium Risk (05/08/2024)   Overall Financial Resource Strain (CARDIA)    Difficulty of Paying Living Expenses: Somewhat hard  Food Insecurity: Food Insecurity Present (05/08/2024)   Hunger  Vital Sign    Worried About Programme Researcher, Broadcasting/film/video in the Last Year: Sometimes true    Ran Out of Food in the Last Year: Sometimes true  Transportation Needs: No Transportation Needs (05/08/2024)   PRAPARE - Administrator, Civil Service (Medical): No    Lack of Transportation (Non-Medical): No  Physical Activity: Sufficiently Active (05/08/2024)   Exercise Vital Sign    Days of Exercise per Week: 7 days    Minutes of Exercise per Session: 30 min  Stress: No Stress Concern Present (05/08/2024)   [employer] of Occupational Health - Occupational Stress Questionnaire    Feeling of Stress: Not at all  Social Connections: Moderately Integrated (05/08/2024)   Social Connection and Isolation Panel    Frequency of Communication with Friends and Family: More than three times a week    Frequency of Social Gatherings with Friends and Family: More than three times a week    Attends Religious Services: More than 4 times per year    Active Member of [employer] or Organizations: Yes    Attends Engineer, Structural: More than 4 times per year    Marital Status: Never married    Tobacco Counseling Counseling given: Not Answered Tobacco comments: Never smoked    Clinical Intake:  Pre-visit preparation completed: Yes  Pain : No/denies pain Pain Score: 0-No pain     BMI - recorded: 33.53 Nutritional Status: BMI > 30  Obese Nutritional Risks: None Diabetes: Yes CBG done?: No Did pt. bring in CBG monitor from home?: No  Lab Results  Component Value Date   HGBA1C 5.9 02/01/2024   HGBA1C 6.4 (A) 10/21/2023   HGBA1C 6.0 (A) 05/06/2023     How often do you need to have someone help you when you read instructions,  pamphlets, or other written materials from your doctor or pharmacy?: 1 - Never  Interpreter Needed?: No      Activities of Daily Living     05/08/2024    6:19 PM 02/04/2024    9:37 AM  In your present state of health, do you have any difficulty performing the following activities:  Hearing? 0 0  Vision? 0 0  Difficulty concentrating or making decisions? 0 0  Walking or climbing stairs? 1 0  Dressing or bathing? 0 0  Doing errands, shopping? 1 0  Preparing Food and eating ? N   Using the Toilet? N   In the past six months, have you accidently leaked urine? N   Do you have problems with loss of bowel control? N   Managing your Medications? N   Managing your Finances? N   Housekeeping or managing your Housekeeping? N     Patient Care Team: Tower, Laine LABOR, MD as PCP - General Darliss Rogue, MD as PCP - Cardiology (Cardiology) Babara Call, MD as Consulting Physician (Oncology)  I have updated your Care Teams any recent Medical Services you may have received from other providers in the past year.     Assessment:   This is a routine wellness examination for [facility].  Hearing/Vision screen Hearing Screening - Comments:: No difficulties Vision Screening - Comments:: No difficulties   Goals Addressed             This Visit's Progress    Patient Stated   On track    01/13/2020, I will continue to walk everyday for 1 mile.        Depression Screen     05/12/2024  3:57 PM 02/04/2024    9:36 AM 01/12/2024    1:35 PM 01/05/2024    1:23 PM 11/03/2023    2:05 PM 05/06/2023    9:59 AM 01/22/2023    1:12 PM  PHQ 2/9 Scores  PHQ - 2 Score 0 0 0 0 6 3 0  PHQ- 9 Score 0 8   13 12      Fall Risk     05/08/2024    6:19 PM 02/04/2024    9:36 AM 11/03/2023    2:05 PM 10/21/2023    9:26 AM 05/06/2023    9:59 AM  Fall Risk   Falls in the past year? 1 0 0 0 1  Number falls in past yr: 0 0 0 0 0  Injury with Fall? 0 0 0 0 0  Risk for fall due to : No Fall Risks No Fall Risks No  Fall Risks No Fall Risks No Fall Risks  Follow up Falls evaluation completed Falls evaluation completed Falls evaluation completed Falls evaluation completed Falls evaluation completed    MEDICARE RISK AT HOME:  Medicare Risk at Home Any stairs in or around the home?: (Patient-Rptd) Yes If so, are there any without handrails?: (Patient-Rptd) No Home free of loose throw rugs in walkways, pet beds, electrical cords, etc?: (Patient-Rptd) Yes Adequate lighting in your home to reduce risk of falls?: (Patient-Rptd) Yes Life alert?: (Patient-Rptd) No Use of a cane, walker or w/c?: (Patient-Rptd) Yes Grab bars in the bathroom?: (Patient-Rptd) Yes Shower chair or bench in shower?: (Patient-Rptd) Yes Elevated toilet seat or a handicapped toilet?: (Patient-Rptd) No  TIMED UP AND GO:  Was the test performed?  No  Cognitive Function: 6CIT completed    01/17/2021    1:20 PM 01/13/2020   10:05 AM 01/10/2019    8:29 AM 12/21/2017    9:00 AM  MMSE - Mini Mental State Exam  Orientation to time 5 5 5 5   Orientation to Place 5 5 5 5   Registration 3 3 3 3   Attention/ Calculation 5 5 0 0  Recall 3 3 3 3   Language- name 2 objects   0 0  Language- repeat 1 1 1 1   Language- follow 3 step command   0 3  Language- read & follow direction   0 0  Write a sentence   0 0  Copy design   0 0  Total score   17 20        05/12/2024    3:58 PM 01/22/2023    1:17 PM 01/20/2022    1:37 PM  6CIT Screen  What Year? 0 points 0 points 0 points  What month? 0 points 0 points 0 points  What time? 0 points 0 points 0 points  Count back from 20 0 points 0 points 0 points  Months in reverse 0 points 0 points 0 points  Repeat phrase 0 points 0 points 0 points  Total Score 0 points 0 points 0 points    Immunizations Immunization History  Administered Date(s) Administered   Fluad Quad(high Dose 65+) 03/10/2019, 03/29/2020, 03/20/2022   Fluad Trivalent(High Dose 65+) 03/26/2023   INFLUENZA, HIGH DOSE SEASONAL PF  04/12/2024   Influenza Split 04/09/2011, 04/01/2012   Influenza Whole 04/23/2007, 04/10/2008, 04/10/2009, 04/02/2010   Influenza,inj,Quad PF,6+ Mos 03/23/2013, 03/29/2014, 03/13/2015, 04/10/2016, 03/25/2017, 04/08/2018   PFIZER(Purple Top)SARS-COV-2 Vaccination 09/05/2019, 09/26/2019, 05/26/2020   Pneumococcal Conjugate-13 02/08/2016   Pneumococcal Polysaccharide-23 04/13/2008, 05/16/2013, 02/26/2017   Td 02/09/2009   Zoster Recombinant(Shingrix) 09/25/2021,  01/06/2022   Zoster, Live 01/28/2011    Screening Tests Health Maintenance  Topic Date Due   FOOT EXAM  09/18/2022   COVID-19 Vaccine (4 - 2025-26 season) 03/14/2024   Mammogram  07/05/2024   COLON CANCER SCREENING ANNUAL FOBT  02/02/2026 (Originally 05/01/2022)   HEMOGLOBIN A1C  08/03/2024   Diabetic kidney evaluation - Urine ACR  10/20/2024   OPHTHALMOLOGY EXAM  11/10/2024   Diabetic kidney evaluation - eGFR measurement  04/19/2025   Medicare Annual Wellness (AWV)  05/12/2025   Colonoscopy  05/01/2028   Pneumococcal Vaccine: 50+ Years  Completed   Influenza Vaccine  Completed   DEXA SCAN  Completed   Hepatitis C Screening  Completed   Zoster Vaccines- Shingrix  Completed   Meningococcal B Vaccine  Aged Out   DTaP/Tdap/Td  Discontinued    Health Maintenance Items Addressed:patient declined   Additional Screening:  Vision Screening: Recommended annual ophthalmology exams for early detection of glaucoma and other disorders of the eye. Is the patient up to date with their annual eye exam?  No   Dental Screening: Recommended annual dental exams for proper oral hygiene  Community Resource Referral / Chronic Care Management: CRR required this visit?  No   CCM required this visit?  No   Plan:    I have personally reviewed and noted the following in the patient's chart:   Medical and social history Use of alcohol, tobacco or illicit drugs  Current medications and supplements including opioid prescriptions. Patient  is not currently taking opioid prescriptions. Functional ability and status Nutritional status Physical activity Advanced directives List of other physicians Hospitalizations, surgeries, and ER visits in previous 12 months Vitals Screenings to include cognitive, depression, and falls Referrals and appointments  In addition, I have reviewed and discussed with patient certain preventive protocols, quality metrics, and best practice recommendations. A written personalized care plan for preventive services as well as general preventive health recommendations were provided to patient.   Lyle MARLA Right, [place]   05/12/2024   After Visit Summary: (MyChart) Due to this being a telephonic visit, the after visit summary with patients personalized plan was offered to patient via MyChart   Notes: Nothing significant to report at this time.

## 2024-05-16 ENCOUNTER — Inpatient Hospital Stay: Attending: Internal Medicine

## 2024-05-16 VITALS — BP 126/60 | HR 99 | Temp 98.6°F | Resp 20

## 2024-05-16 DIAGNOSIS — D631 Anemia in chronic kidney disease: Secondary | ICD-10-CM | POA: Insufficient documentation

## 2024-05-16 DIAGNOSIS — E538 Deficiency of other specified B group vitamins: Secondary | ICD-10-CM | POA: Diagnosis not present

## 2024-05-16 DIAGNOSIS — D508 Other iron deficiency anemias: Secondary | ICD-10-CM

## 2024-05-16 DIAGNOSIS — D509 Iron deficiency anemia, unspecified: Secondary | ICD-10-CM | POA: Insufficient documentation

## 2024-05-16 DIAGNOSIS — N1831 Chronic kidney disease, stage 3a: Secondary | ICD-10-CM | POA: Insufficient documentation

## 2024-05-16 MED ORDER — IRON SUCROSE 20 MG/ML IV SOLN
200.0000 mg | Freq: Once | INTRAVENOUS | Status: AC
  Administered 2024-05-16: 200 mg via INTRAVENOUS
  Filled 2024-05-16: qty 10

## 2024-05-16 NOTE — Patient Instructions (Signed)

## 2024-05-23 ENCOUNTER — Inpatient Hospital Stay

## 2024-05-23 VITALS — BP 142/82 | HR 62 | Temp 97.3°F | Resp 18

## 2024-05-23 DIAGNOSIS — D508 Other iron deficiency anemias: Secondary | ICD-10-CM

## 2024-05-23 DIAGNOSIS — D509 Iron deficiency anemia, unspecified: Secondary | ICD-10-CM | POA: Diagnosis not present

## 2024-05-23 MED ORDER — IRON SUCROSE 20 MG/ML IV SOLN
200.0000 mg | Freq: Once | INTRAVENOUS | Status: AC
  Administered 2024-05-23: 200 mg via INTRAVENOUS
  Filled 2024-05-23: qty 10

## 2024-05-23 NOTE — Patient Instructions (Signed)

## 2024-05-27 ENCOUNTER — Encounter: Payer: Self-pay | Admitting: Family Medicine

## 2024-05-27 ENCOUNTER — Ambulatory Visit: Admitting: Family Medicine

## 2024-05-27 VITALS — BP 110/64 | HR 88 | Temp 97.8°F | Ht 59.0 in | Wt 166.1 lb

## 2024-05-27 DIAGNOSIS — D631 Anemia in chronic kidney disease: Secondary | ICD-10-CM

## 2024-05-27 DIAGNOSIS — R58 Hemorrhage, not elsewhere classified: Secondary | ICD-10-CM | POA: Diagnosis not present

## 2024-05-27 DIAGNOSIS — N1832 Chronic kidney disease, stage 3b: Secondary | ICD-10-CM

## 2024-05-27 DIAGNOSIS — N1831 Chronic kidney disease, stage 3a: Secondary | ICD-10-CM

## 2024-05-27 DIAGNOSIS — D508 Other iron deficiency anemias: Secondary | ICD-10-CM | POA: Diagnosis not present

## 2024-05-27 DIAGNOSIS — Z7985 Long-term (current) use of injectable non-insulin antidiabetic drugs: Secondary | ICD-10-CM | POA: Diagnosis not present

## 2024-05-27 NOTE — Assessment & Plan Note (Signed)
 In process of venofer  infusions  Clinically improving Reviewed last heme note   Did have a likely IV infiltration with last infusion   For labs Monday

## 2024-05-27 NOTE — Progress Notes (Signed)
 Subjective:    Patient ID: Martha Hamilton, female    DOB: July 13, 1951, 73 y.o.   MRN: [id number]  HPI  Wt Readings from Last 3 Encounters:  05/27/24 166 lb 2 oz (75.4 kg)  05/12/24 166 lb (75.3 kg)  04/26/24 166 lb 14.4 oz (75.7 kg)   33.55 kg/m  Vitals:   05/27/24 1053  BP: 110/64  Pulse: 88  Temp: 97.8 F (36.6 C)  SpO2: 97%     Pt present for c/o  Bruising after an infusion  Iron  def anemia /anemia of chronic dz   Had iron  infusion on 11/10 Was told she would get a bruise  Whole arm is bruised /dark/black in color by Monday night   ? IV infiltrated   Has been bruised before but nothing like that  Swollen also  Put ice on it / then heat  Was very painful  Quite improved now  Very sensitive to the touch    Sees hematology for  Iron  def anemia  (chronic blood loss vs malabsorbsion)  B12 def Anemia with ckd   Lab Results  Component Value Date   WBC 5.2 04/19/2024   HGB 8.8 (L) 04/19/2024   HCT 27.1 (L) 04/19/2024   MCV 90.0 04/19/2024   PLT 362 04/19/2024   Lab Results  Component Value Date   IRON  46 04/15/2024   TIBC 361 04/15/2024   FERRITIN 37 04/15/2024    Lab Results  Component Value Date   NA 133 (L) 04/19/2024   K 4.7 04/19/2024   CO2 20 (L) 04/19/2024   GLUCOSE 109 (H) 04/19/2024   BUN 19 04/19/2024   CREATININE 1.14 (H) 04/19/2024   CALCIUM 9.1 04/19/2024   GFR 60.40 02/01/2024   GFRNONAA 51 (L) 04/19/2024   Lab Results  Component Value Date   ALT 12 04/19/2024   AST 32 04/19/2024   ALKPHOS 38 04/19/2024   BILITOT 0.7 04/19/2024    Lab Results  Component Value Date   VITAMINB12 486 04/15/2024   Labs are due Monday     Patient Active Problem List   Diagnosis Date Noted   Ecchymosis 05/27/2024   Long-term (current) use of injectable non-insulin antidiabetic drugs 05/27/2024   Thyroid  nodule 06/29/2023   Pedal edema 11/12/2022   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   Muscle cramps  02/28/2022   Neck pain 02/28/2022   Strain of flexor tendon of wrist 12/02/2021   Tendinitis of left wrist 12/02/2021   Left wrist pain 11/13/2021   Family history of cancer 08/27/2020   History of breast cancer 08/27/2020   IDA (iron  deficiency anemia) 08/27/2020   B12 deficiency 08/27/2020   Chronic kidney disease (CKD) stage G3b/A1, moderately decreased glomerular filtration rate (GFR) between 30-44 mL/min/1.73 square meter and albuminuria creatinine ratio less than 30 mg/g (HCC) 07/24/2020   Tachycardia 01/11/2019   Fatty liver 06/21/2018   Obesity (BMI 30-39.9) 06/21/2018   GERD (gastroesophageal reflux disease) 05/25/2018   H/O vertigo 05/11/2018   Essential (hemorrhagic) thrombocythemia (HCC) 12/25/2017   Tear of medial meniscus of knee 12/21/2017   Pain in left knee 12/21/2017   Left knee pain 09/21/2017   Grief reaction 12/17/2016   Estrogen deficiency 09/08/2016   Myalgia 02/08/2016   Anemia in chronic kidney disease (CKD) 02/09/2013   Abnormal EKG 07/27/2012   Special screening for malignant neoplasms, colon 04/29/2011   Encounter for gynecological examination 04/29/2011   Routine general medical examination at a health care facility 04/20/2011  Osteoarthrosis, hand 10/31/2008   Diabetes type 2, controlled (HCC) 07/10/2008   Hyperlipidemia associated with type 2 diabetes mellitus (HCC) 01/26/2008   Essential hypertension 01/26/2008   Allergic rhinitis 12/07/2007   Past Medical History:  Diagnosis Date   Allergic rhinitis    Allergy 1957   Asthma    Breast cancer [facility]) 1996   right breast   Cataract Oct. 2021   Depression 1981   DM2 (diabetes mellitus, type 2) (HCC)    GERD (gastroesophageal reflux disease)    Hx of cardiovascular stress test    a. Lex MV 2/14:  EF 69%, no ischemia   Hyperlipidemia    Hypertension    IDA (iron  deficiency anemia) 08/27/2020   Iron  deficiency anemia    Obesity    Osteoarthritis    hands   Thyroid  nodule 06/29/2023    Ulcer    Past Surgical History:  Procedure Laterality Date   APPENDECTOMY  1976   BREAST LUMPECTOMY Right 1996   right breast, lumpectomy w/nodes   CATARACT EXTRACTION W/PHACO Left 11/28/2020   Procedure: CATARACT EXTRACTION PHACO AND INTRAOCULAR LENS PLACEMENT (IOC) LEFT DIABETIC;  Surgeon: Mittie Gaskin, MD;  Location: [facility];  Service: Ophthalmology;  Laterality: Left;  5.02 01:07.4 7.4%   CATARACT EXTRACTION W/PHACO Right 12/12/2020   Procedure: CATARACT EXTRACTION PHACO AND INTRAOCULAR LENS PLACEMENT (IOC) RIGHT DIABETIC 5.22 00:59.2;  Surgeon: Mittie Gaskin, MD;  Location: [facility];  Service: Ophthalmology;  Laterality: Right;   CHONDROPLASTY Left 02/10/2018   Procedure: CHONDROPLASTY;  Surgeon: Leora Lynwood SAUNDERS, MD;  Location: [facility];  Service: Orthopedics;  Laterality: Left;   EYE SURGERY  2021   FOOT SURGERY Left 07/2022   bone removal   GASTRIC RESTRICTION SURGERY     for reflux   HYSTEROSCOPY WITH D & C N/A 10/18/2012   Procedure: DILATATION AND CURETTAGE /HYSTEROSCOPY;  Surgeon: Winton Felt, MD;  Location: [facility];  Service: Gynecology;  Laterality: N/A;   KNEE ARTHROSCOPY WITH MEDIAL MENISECTOMY Left 02/10/2018   Procedure: KNEE ARTHROSCOPY WITH MEDIAL and LATERAL MENISECTOMY;  Surgeon: Leora Lynwood SAUNDERS, MD;  Location: [facility];  Service: Orthopedics;  Laterality: Left;   POLYPECTOMY N/A 10/18/2012   Procedure: POLYPECTOMY;  Surgeon: Winton Felt, MD;  Location: [facility];  Service: Gynecology;  Laterality: N/A;   SYNOVECTOMY Left 02/10/2018   Procedure: SYNOVECTOMY;  Surgeon: Leora Lynwood SAUNDERS, MD;  Location: [facility];  Service: Orthopedics;  Laterality: Left;   Social History   Tobacco Use   Smoking status: Never   Smokeless tobacco: Never   Tobacco comments:    Never smoked  Vaping Use   Vaping status: Never Used  Substance Use Topics   Alcohol use: Never   Drug use: Never   Family History  Problem Relation Age of Onset    Diabetes Mother    Heart disease Mother        Pacemaker, CHF   Hypertension Mother    Kidney cancer Mother    Cancer Mother    Kidney disease Mother    Stroke Father    CAD Father 30       Died with MI   Heart disease Father    Esophageal cancer Brother    Diabetes Brother    Heart disease Sister    Cancer Brother    Diabetes Sister    Colon cancer Neg Hx    Rectal cancer Neg Hx    Stomach cancer Neg Hx    Pancreatic cancer Neg  Hx    Allergies  Allergen Reactions   Cholestyramine Other (See Comments)    REACTION: Muscles tightened up, drew up.   Simvastatin  Other (See Comments)    Muscular pain   Lipitor [Atorvastatin] Other (See Comments)    Muscle cramping   Niacin Nausea And Vomiting   Current Outpatient Medications on File Prior to Visit  Medication Sig Dispense Refill   Accu-Chek Softclix Lancets lancets TEST BLOOD SUGAR EVERY DAY 100 each 1   acetaminophen  (TYLENOL ) 500 MG tablet Take 1,000 mg by mouth every 6 (six) hours as needed for mild pain.      Alcohol Swabs (DROPSAFE ALCOHOL PREP) 70 % PADS USE ONE TIME DAILY AS DIRECTED 100 each 1   amitriptyline  (ELAVIL ) 25 MG tablet Take 1 tablet (25 mg total) by mouth at bedtime. 90 tablet 2   aspirin 81 MG tablet Take 81 mg by mouth at bedtime.      Blood Glucose Calibration (ACCU-CHEK AVIVA) SOLN USE TO CALIBRATE METER AS DIRECTED 1 each 2   Blood Glucose Monitoring Suppl (ACCU-CHEK AVIVA PLUS) w/Device KIT Use to check blood sugar once daily for DM (dx. E11.9) 1 kit 0   calcium elemental as carbonate (BARIATRIC TUMS ULTRA) 400 MG chewable tablet Chew 1,000 mg by mouth daily as needed for heartburn.     Camphor-Menthol-Methyl Sal (SALONPAS) 3.07-19-08 % PTCH Apply topically.     carvedilol  (COREG ) 25 MG tablet Take 1 tablet (25 mg total) by mouth 2 (two) times daily. 180 tablet 3   Cholecalciferol (VITAMIN D ) 2000 units tablet Take 2,000 Units by mouth daily.     colchicine 0.6 MG tablet Take 1 tablet (0.6 mg total)  by mouth daily. 30 tablet 0   Cyanocobalamin  (B-12) 2500 MCG SUBL Place 1 tablet under the tongue daily. 30 tablet 5   Docusate Sodium  (STOOL SOFTENER) 100 MG capsule Take 100 mg by mouth in the morning and at bedtime.     ezetimibe  (ZETIA ) 10 MG tablet Take 1 tablet (10 mg total) by mouth daily. 90 tablet 2   fenofibrate  (TRICOR ) 145 MG tablet TAKE 1 TABLET EVERY DAY 90 tablet 1   gabapentin  (NEURONTIN ) 300 MG capsule TAKE 1 CAPSULE THREE TIMES DAILY AS NEEDED 90 capsule 3   glucose blood (ACCU-CHEK AVIVA PLUS) test strip TEST BLOOD SUGAR EVERY DAY FOR DIABETES 100 strip 0   loratadine (CLARITIN) 10 MG tablet Take 10 mg by mouth daily as needed for allergies.     losartan  (COZAAR ) 50 MG tablet Take 1 tablet (50 mg total) by mouth daily. 30 tablet 3   meclizine  (ANTIVERT ) 25 MG tablet Take 1 tablet (25 mg total) by mouth 3 (three) times daily as needed for dizziness. 90 tablet 3   metFORMIN  (GLUCOPHAGE ) 1000 MG tablet TAKE 1/2 TABLET TWICE DAILY WITH MEALS 90 tablet 1   NEXLETOL  180 MG TABS TAKE 1 TABLET EVERY DAY 90 tablet 3   omeprazole  (PRILOSEC) 40 MG capsule TAKE 1 CAPSULE IN THE MORNING AND AT BEDTIME. 180 capsule 1   ondansetron  (ZOFRAN ) 4 MG tablet TAKE 1 TABLET EVERY 4 HOURS AS NEEDED FOR NAUSEA AND VOMITING 90 tablet 0   SALINE NASAL SPRAY NA Place 1 spray into the nose daily as needed (congestion).      tirzepatide  (MOUNJARO ) 7.5 MG/0.5ML Pen Inject 7.5 mg into the skin once a week. 2 mL 2   No current facility-administered medications on file prior to visit.    Review of Systems  Constitutional:  Negative for activity change, appetite change, fatigue, fever and unexpected weight change.  HENT:  Negative for congestion, ear pain, rhinorrhea, sinus pressure and sore throat.   Eyes:  Negative for pain, redness and visual disturbance.  Respiratory:  Negative for cough, shortness of breath and wheezing.   Cardiovascular:  Negative for chest pain and palpitations.  Gastrointestinal:   Negative for abdominal pain, blood in stool, constipation and diarrhea.  Endocrine: Negative for polydipsia and polyuria.  Genitourinary:  Negative for dysuria, frequency and urgency.  Musculoskeletal:  Negative for arthralgias, back pain and myalgias.       Left arm pain and swelling after iron  infusion   Skin:  Negative for pallor and rash.  Allergic/Immunologic: Negative for environmental allergies.  Neurological:  Negative for dizziness, syncope and headaches.  Hematological:  Negative for adenopathy. Does not bruise/bleed easily.  Psychiatric/Behavioral:  Negative for decreased concentration and dysphoric mood. The patient is not nervous/anxious.        Objective:   Physical Exam Constitutional:      General: She is not in acute distress.    Appearance: Normal appearance. She is well-developed. She is obese. She is not ill-appearing or diaphoretic.  HENT:     Head: Normocephalic and atraumatic.  Eyes:     Conjunctiva/sclera: Conjunctivae normal.     Pupils: Pupils are equal, round, and reactive to light.  Neck:     Thyroid : No thyromegaly.     Vascular: No carotid bruit or JVD.  Cardiovascular:     Rate and Rhythm: Normal rate and regular rhythm.     Pulses: Normal pulses.     Heart sounds: Normal heart sounds.     No gallop.  Pulmonary:     Effort: Pulmonary effort is normal. No respiratory distress.     Breath sounds: Normal breath sounds. No wheezing or rales.  Abdominal:     General: There is no distension or abdominal bruit.     Palpations: Abdomen is soft.  Musculoskeletal:     Cervical back: Normal range of motion and neck supple.     Right lower leg: No edema.     Left lower leg: No edema.  Lymphadenopathy:     Cervical: No cervical adenopathy.  Skin:    General: Skin is warm and dry.     Coloration: Skin is not pale.     Findings: No rash.     Comments: Patchy brown skin disoloration on left arm in area of infusion, mild swelling but soft tissue, mild  tenderness No palpable cords Normal rom of arm and wrist and hand     Neurological:     Mental Status: She is alert.     Coordination: Coordination normal.     Deep Tendon Reflexes: Reflexes are normal and symmetric. Reflexes normal.  Psychiatric:        Mood and Affect: Mood normal.           Assessment & Plan:   Problem List Items Addressed This Visit       Genitourinary   Anemia in chronic kidney disease (CKD) (Chronic)   Lab Results  Component Value Date   WBC 5.2 04/19/2024   HGB 8.8 (L) 04/19/2024   HCT 27.1 (L) 04/19/2024   MCV 90.0 04/19/2024   PLT 362 04/19/2024   Pt has labs planned Monday  Getting iron  infusions       Chronic kidney disease (CKD) stage G3b/A1, moderately decreased glomerular filtration rate (GFR) between 30-44 mL/min/1.73 square meter  and albuminuria creatinine ratio less than 30 mg/g (HCC)   Lab Results  Component Value Date   NA 133 (L) 04/19/2024   K 4.7 04/19/2024   CO2 20 (L) 04/19/2024   GLUCOSE 109 (H) 04/19/2024   BUN 19 04/19/2024   CREATININE 1.14 (H) 04/19/2024   CALCIUM 9.1 04/19/2024   GFR 60.40 02/01/2024   GFRNONAA 51 (L) 04/19/2024   With anemia  Labs planned Monday  Staying hydrated  No nsaid besides 81 mg asa daily        Other   IDA (iron  deficiency anemia) (Chronic)   In process of venofer  infusions  Clinically improving Reviewed last heme note   Did have a likely IV infiltration with last infusion   For labs Monday       Long-term (current) use of injectable non-insulin antidiabetic drugs   Is able to tolerate moujaro 7.5 mg (not more than that due to GI side effects)       Ecchymosis - Primary   Left lower arm-occurred immediately after her routine iron  infusion (which hurt more than usual) on Monday  Per pt started with swelling/pain and black and blue color Now is faded/ brown and less painful  On exam tissue is soft/mildly tender with normal rom of arm  Feeling ok  Takes 81 mg asa  daily/no other nsaids   I suspect her IV /infusion infiltrated  Seems to be improving  Encouraged pt to reach out to infusion center Has labs scheduled on Monday   Discussed red flags to watch for -redness/increase pain or swelling -see AVS Continue gentle warm compresses  Follow up with hematology as planned

## 2024-05-27 NOTE — Assessment & Plan Note (Signed)
 Lab Results  Component Value Date   NA 133 (L) 04/19/2024   K 4.7 04/19/2024   CO2 20 (L) 04/19/2024   GLUCOSE 109 (H) 04/19/2024   BUN 19 04/19/2024   CREATININE 1.14 (H) 04/19/2024   CALCIUM 9.1 04/19/2024   GFR 60.40 02/01/2024   GFRNONAA 51 (L) 04/19/2024   With anemia  Labs planned Monday  Staying hydrated  No nsaid besides 81 mg asa daily

## 2024-05-27 NOTE — Patient Instructions (Signed)
 I think the bruising on your arm is from an infiltrated IV/ infusion  Glad it is getting better  I expect it to continue to improve gradually   Keep area clean /soap and water  Continue a gentle warm compress as needed for 10 minutes at a time   Watch for increase in swelling or pain or any redness   Make sure to let the infusion folks at the cancer center know   Take care of yourself   Get labs Monday as planned

## 2024-05-27 NOTE — Assessment & Plan Note (Signed)
 Lab Results  Component Value Date   WBC 5.2 04/19/2024   HGB 8.8 (L) 04/19/2024   HCT 27.1 (L) 04/19/2024   MCV 90.0 04/19/2024   PLT 362 04/19/2024   Pt has labs planned Monday  Getting iron  infusions

## 2024-05-27 NOTE — Assessment & Plan Note (Signed)
 Is able to tolerate moujaro 7.5 mg (not more than that due to GI side effects)

## 2024-05-27 NOTE — Assessment & Plan Note (Signed)
 Left lower arm-occurred immediately after her routine iron  infusion (which hurt more than usual) on Monday  Per pt started with swelling/pain and black and blue color Now is faded/ brown and less painful  On exam tissue is soft/mildly tender with normal rom of arm  Feeling ok  Takes 81 mg asa daily/no other nsaids   I suspect her IV /infusion infiltrated  Seems to be improving  Encouraged pt to reach out to infusion center Has labs scheduled on Monday   Discussed red flags to watch for -redness/increase pain or swelling -see AVS Continue gentle warm compresses  Follow up with hematology as planned

## 2024-05-30 ENCOUNTER — Other Ambulatory Visit: Payer: Self-pay | Admitting: Oncology

## 2024-05-30 ENCOUNTER — Ambulatory Visit: Payer: Self-pay | Admitting: Oncology

## 2024-05-30 ENCOUNTER — Inpatient Hospital Stay

## 2024-05-30 DIAGNOSIS — D649 Anemia, unspecified: Secondary | ICD-10-CM

## 2024-05-30 DIAGNOSIS — D509 Iron deficiency anemia, unspecified: Secondary | ICD-10-CM | POA: Diagnosis not present

## 2024-05-30 LAB — CBC (CANCER CENTER ONLY)
HCT: 27.7 % — ABNORMAL LOW (ref 36.0–46.0)
Hemoglobin: 8.9 g/dL — ABNORMAL LOW (ref 12.0–15.0)
MCH: 29.5 pg (ref 26.0–34.0)
MCHC: 32.1 g/dL (ref 30.0–36.0)
MCV: 91.7 fL (ref 80.0–100.0)
Platelet Count: 363 K/uL (ref 150–400)
RBC: 3.02 MIL/uL — ABNORMAL LOW (ref 3.87–5.11)
RDW: 17.4 % — ABNORMAL HIGH (ref 11.5–15.5)
WBC Count: 6 K/uL (ref 4.0–10.5)
nRBC: 0 % (ref 0.0–0.2)

## 2024-05-31 ENCOUNTER — Other Ambulatory Visit: Payer: Self-pay | Admitting: Family Medicine

## 2024-05-31 ENCOUNTER — Other Ambulatory Visit: Payer: Self-pay | Admitting: Cardiology

## 2024-05-31 ENCOUNTER — Other Ambulatory Visit: Payer: Self-pay

## 2024-05-31 DIAGNOSIS — D508 Other iron deficiency anemias: Secondary | ICD-10-CM

## 2024-05-31 DIAGNOSIS — Z1231 Encounter for screening mammogram for malignant neoplasm of breast: Secondary | ICD-10-CM

## 2024-06-01 ENCOUNTER — Encounter: Payer: Self-pay | Admitting: Oncology

## 2024-06-03 ENCOUNTER — Inpatient Hospital Stay

## 2024-06-03 VITALS — BP 113/75

## 2024-06-03 DIAGNOSIS — D508 Other iron deficiency anemias: Secondary | ICD-10-CM

## 2024-06-03 DIAGNOSIS — D509 Iron deficiency anemia, unspecified: Secondary | ICD-10-CM | POA: Diagnosis not present

## 2024-06-03 MED ORDER — EPOETIN ALFA-EPBX 20000 UNIT/ML IJ SOLN
20000.0000 [IU] | Freq: Once | INTRAMUSCULAR | Status: AC
  Administered 2024-06-03: 20000 [IU] via SUBCUTANEOUS
  Filled 2024-06-03: qty 1

## 2024-06-15 DIAGNOSIS — M5412 Radiculopathy, cervical region: Secondary | ICD-10-CM | POA: Diagnosis not present

## 2024-06-24 ENCOUNTER — Inpatient Hospital Stay: Attending: Internal Medicine

## 2024-06-24 ENCOUNTER — Inpatient Hospital Stay

## 2024-06-24 VITALS — BP 120/57

## 2024-06-24 DIAGNOSIS — D508 Other iron deficiency anemias: Secondary | ICD-10-CM

## 2024-06-24 DIAGNOSIS — N1832 Chronic kidney disease, stage 3b: Secondary | ICD-10-CM | POA: Diagnosis present

## 2024-06-24 DIAGNOSIS — D631 Anemia in chronic kidney disease: Secondary | ICD-10-CM | POA: Diagnosis not present

## 2024-06-24 LAB — HEMOGLOBIN AND HEMATOCRIT (CANCER CENTER ONLY)
HCT: 29.8 % — ABNORMAL LOW (ref 36.0–46.0)
Hemoglobin: 9.6 g/dL — ABNORMAL LOW (ref 12.0–15.0)

## 2024-06-24 MED ORDER — EPOETIN ALFA-EPBX 20000 UNIT/ML IJ SOLN
20000.0000 [IU] | Freq: Once | INTRAMUSCULAR | Status: AC
  Administered 2024-06-24: 20000 [IU] via SUBCUTANEOUS
  Filled 2024-06-24: qty 1

## 2024-06-27 ENCOUNTER — Inpatient Hospital Stay

## 2024-07-01 ENCOUNTER — Inpatient Hospital Stay

## 2024-07-01 DIAGNOSIS — N1832 Chronic kidney disease, stage 3b: Secondary | ICD-10-CM | POA: Diagnosis not present

## 2024-07-01 DIAGNOSIS — D649 Anemia, unspecified: Secondary | ICD-10-CM

## 2024-07-01 LAB — CBC WITH DIFFERENTIAL (CANCER CENTER ONLY)
Abs Immature Granulocytes: 0.03 K/uL (ref 0.00–0.07)
Basophils Absolute: 0.1 K/uL (ref 0.0–0.1)
Basophils Relative: 1 %
Eosinophils Absolute: 0.2 K/uL (ref 0.0–0.5)
Eosinophils Relative: 2 %
HCT: 32.6 % — ABNORMAL LOW (ref 36.0–46.0)
Hemoglobin: 10.5 g/dL — ABNORMAL LOW (ref 12.0–15.0)
Immature Granulocytes: 0 %
Lymphocytes Relative: 21 %
Lymphs Abs: 1.5 K/uL (ref 0.7–4.0)
MCH: 28.8 pg (ref 26.0–34.0)
MCHC: 32.2 g/dL (ref 30.0–36.0)
MCV: 89.6 fL (ref 80.0–100.0)
Monocytes Absolute: 0.6 K/uL (ref 0.1–1.0)
Monocytes Relative: 8 %
Neutro Abs: 5 K/uL (ref 1.7–7.7)
Neutrophils Relative %: 68 %
Platelet Count: 468 K/uL — ABNORMAL HIGH (ref 150–400)
RBC: 3.64 MIL/uL — ABNORMAL LOW (ref 3.87–5.11)
RDW: 15.9 % — ABNORMAL HIGH (ref 11.5–15.5)
WBC Count: 7.4 K/uL (ref 4.0–10.5)
nRBC: 0 % (ref 0.0–0.2)

## 2024-07-01 LAB — IRON AND TIBC
Iron: 47 ug/dL (ref 28–170)
Saturation Ratios: 12 % (ref 10.4–31.8)
TIBC: 375 ug/dL (ref 250–450)
UIBC: 329 ug/dL

## 2024-07-01 LAB — RETIC PANEL
Immature Retic Fract: 17.9 % — ABNORMAL HIGH (ref 2.3–15.9)
RBC.: 3.66 MIL/uL — ABNORMAL LOW (ref 3.87–5.11)
Retic Count, Absolute: 96.6 K/uL (ref 19.0–186.0)
Retic Ct Pct: 2.6 % (ref 0.4–3.1)
Reticulocyte Hemoglobin: 23.5 pg — ABNORMAL LOW

## 2024-07-01 LAB — FERRITIN: Ferritin: 61 ng/mL (ref 11–307)

## 2024-07-04 ENCOUNTER — Encounter: Payer: Self-pay | Admitting: Oncology

## 2024-07-04 ENCOUNTER — Inpatient Hospital Stay: Admitting: Oncology

## 2024-07-04 ENCOUNTER — Inpatient Hospital Stay

## 2024-07-04 VITALS — BP 139/62 | HR 91 | Temp 98.0°F | Resp 19 | Wt 163.3 lb

## 2024-07-04 DIAGNOSIS — D631 Anemia in chronic kidney disease: Secondary | ICD-10-CM | POA: Diagnosis not present

## 2024-07-04 DIAGNOSIS — E538 Deficiency of other specified B group vitamins: Secondary | ICD-10-CM

## 2024-07-04 DIAGNOSIS — N1832 Chronic kidney disease, stage 3b: Secondary | ICD-10-CM | POA: Diagnosis not present

## 2024-07-04 DIAGNOSIS — D508 Other iron deficiency anemias: Secondary | ICD-10-CM

## 2024-07-04 DIAGNOSIS — Z853 Personal history of malignant neoplasm of breast: Secondary | ICD-10-CM

## 2024-07-04 DIAGNOSIS — N1831 Chronic kidney disease, stage 3a: Secondary | ICD-10-CM

## 2024-07-04 NOTE — Assessment & Plan Note (Signed)
Remote history of right breast cancer 1996 Continue annual screening mammogram.

## 2024-07-04 NOTE — Progress Notes (Signed)
 " Hematology/Oncology follow up note [phone] [phone]   Patient Care Team: Tower, Laine LABOR, MD as PCP - General Darliss Rogue, MD as PCP - Cardiology (Cardiology) Babara Call, MD as Consulting Physician (Oncology)  ASSESSMENT & PLAN:   IDA (iron  deficiency anemia) Iron  deficiency anemia Chronic blood loss vs malabsorption.  She has had GI work up in the past.  Labs reviewed and discussed with patient.   Lab Results  Component Value Date   HGB 10.5 (L) 07/01/2024   TIBC 375 07/01/2024   IRONPCTSAT 12 07/01/2024   FERRITIN 61 07/01/2024   Patient is status post IV Venofer  treatments.   Option of additional IV Venofer  treatments to further improve iron  stores was discussed.  Patient declined.  I recommend patient to try over-the-counter oral iron  supplementation as maintenance.   Anemia in chronic kidney disease (CKD) No M protein on multiple myeloma panel, normal light chain ratio reticulocyte panel,  Hemoglobin is above 10.  Hold off erythropoietin replacement therapy.  B12 deficiency B12 level is normal, continue sublingual B12 2500mcg 2 times per week  Hold off B12 injection   History of breast cancer Remote history of right breast cancer 1996 Continue annual screening mammogram.   Orders Placed This Encounter  Procedures   CBC with Differential (Cancer Center Only)    Standing Status:   Future    Expected Date:   10/02/2024    Expiration Date:   12/31/2024   Iron  and TIBC    Standing Status:   Future    Expected Date:   10/02/2024    Expiration Date:   12/31/2024   Ferritin    Standing Status:   Future    Expected Date:   10/02/2024    Expiration Date:   12/31/2024   Retic Panel    Standing Status:   Future    Expected Date:   10/02/2024    Expiration Date:   12/31/2024   Hemoglobin and Hematocrit (Cancer Center Only)    Standing Status:   Standing    Number of Occurrences:   2    Expiration Date:   07/04/2025   Follow up  Monthly  lab H&H +/- retacrit . x2 3 months labs prior to MD +/- Venofer  + retacrit  CBC, Iron , TIBC Ferritin retic panle.   All questions were answered. The patient knows to call the clinic with any problems, questions or concerns.  Call Babara, MD, PhD [facility] Hematology Oncology 07/04/2024    CHIEF COMPLAINTS/REASON FOR VISIT:  Follow-up for iron  deficiency anemia  HISTORY OF PRESENTING ILLNESS:  Martha Hamilton is a  73 y.o.  female with PMH listed below who was referred to me for evaluation of anemia Reviewed patient's recent labs that was done.  08/08/20 Labs revealed anemia with hemoglobin of 9.2, MCV 93 .   Reviewed patient's previous labs ordered by primary care physician's office, anemia is chronic onset , duration is since 2013, with baseline in 11s, worsened during the recent 6 months, after she stops taking oral iron  supplementation.   Associated signs and symptoms: Patient reports fatigue. denies SOB with exertion.  Denies weight loss, easy bruising, hematochezia, hemoptysis, hematuria. Context: History of GI bleeding: deneis               History of gastric restriction surgery for GERD                Last colonoscopy: 05/01/21. Last Endoscopy 05/01/21  Remote history of right breast cancer, s/p  lumpectomy with node. She denies any previous chemotherapy or any anti estrogen therapy.     Family history positive for brother with esophageal cancer, mother with RCC.  I have recommended genetic testing.  She wanted to defer and if she changes her mind she will update me..   patient has had a EGD done by Dr.Perry.  EGD showed esophageal stricture which was dilated.  Otherwise unremarkable.  INTERVAL HISTORY Martha Hamilton is a 73 y.o. female who has above history reviewed by me today presents for follow up visit for iron  deficiency anemia  Today she reports feeling well.  She has tolerated IV Venofer  treatments except discoloration of skin at the site of last Venofer   treatment.  Review of Systems  Constitutional:  Positive for fatigue. Negative for appetite change, chills and fever.  HENT:   Negative for hearing loss and voice change.   Eyes:  Negative for eye problems.  Respiratory:  Negative for chest tightness and cough.   Cardiovascular:  Negative for chest pain.  Gastrointestinal:  Negative for abdominal distention, abdominal pain and blood in stool.  Endocrine: Negative for hot flashes.  Genitourinary:  Negative for difficulty urinating and frequency.   Musculoskeletal:  Negative for arthralgias.  Skin:  Negative for itching and rash.  Neurological:  Negative for extremity weakness.  Hematological:  Negative for adenopathy.  Psychiatric/Behavioral:  Negative for confusion.      MEDICAL HISTORY:  Past Medical History:  Diagnosis Date   Allergic rhinitis    Allergy 1957   Asthma    Breast cancer [facility]) 1996   right breast   Cataract Oct. 2021   Depression 1981   DM2 (diabetes mellitus, type 2) (HCC)    GERD (gastroesophageal reflux disease)    Hx of cardiovascular stress test    a. Lex MV 2/14:  EF 69%, no ischemia   Hyperlipidemia    Hypertension    IDA (iron  deficiency anemia) 08/27/2020   Iron  deficiency anemia    Obesity    Osteoarthritis    hands   Thyroid  nodule 06/29/2023   Ulcer     SURGICAL HISTORY: Past Surgical History:  Procedure Laterality Date   APPENDECTOMY  1976   BREAST LUMPECTOMY Right 1996   right breast, lumpectomy w/nodes   CATARACT EXTRACTION W/PHACO Left 11/28/2020   Procedure: CATARACT EXTRACTION PHACO AND INTRAOCULAR LENS PLACEMENT (IOC) LEFT DIABETIC;  Surgeon: Mittie Gaskin, MD;  Location: [facility];  Service: Ophthalmology;  Laterality: Left;  5.02 01:07.4 7.4%   CATARACT EXTRACTION W/PHACO Right 12/12/2020   Procedure: CATARACT EXTRACTION PHACO AND INTRAOCULAR LENS PLACEMENT (IOC) RIGHT DIABETIC 5.22 00:59.2;  Surgeon: Mittie Gaskin, MD;  Location: [facility];   Service: Ophthalmology;  Laterality: Right;   CHONDROPLASTY Left 02/10/2018   Procedure: CHONDROPLASTY;  Surgeon: Leora Lynwood SAUNDERS, MD;  Location: [facility];  Service: Orthopedics;  Laterality: Left;   EYE SURGERY  2021   FOOT SURGERY Left 07/2022   bone removal   GASTRIC RESTRICTION SURGERY     for reflux   HYSTEROSCOPY WITH D & C N/A 10/18/2012   Procedure: DILATATION AND CURETTAGE /HYSTEROSCOPY;  Surgeon: Winton Felt, MD;  Location: [facility];  Service: Gynecology;  Laterality: N/A;   KNEE ARTHROSCOPY WITH MEDIAL MENISECTOMY Left 02/10/2018   Procedure: KNEE ARTHROSCOPY WITH MEDIAL and LATERAL MENISECTOMY;  Surgeon: Leora Lynwood SAUNDERS, MD;  Location: [facility];  Service: Orthopedics;  Laterality: Left;   POLYPECTOMY N/A 10/18/2012   Procedure: POLYPECTOMY;  Surgeon: Winton Felt, MD;  Location: [facility];  Service: Gynecology;  Laterality: N/A;   SYNOVECTOMY Left 02/10/2018   Procedure: SYNOVECTOMY;  Surgeon: Leora Lynwood SAUNDERS, MD;  Location: [facility];  Service: Orthopedics;  Laterality: Left;    SOCIAL HISTORY: Social History   Socioeconomic History   Marital status: Single    Spouse name: Not on file   Number of children: 0   Years of education: Not on file   Highest education level: Some college, no degree  Occupational History   Occupation: retired    Associate Professor: RETIRED  Tobacco Use   Smoking status: Never   Smokeless tobacco: Never   Tobacco comments:    Never smoked  Vaping Use   Vaping status: Never Used  Substance and Sexual Activity   Alcohol use: Never   Drug use: Never   Sexual activity: Not Currently    Birth control/protection: Post-menopausal  Other Topics Concern   Not on file  Social History Narrative   Lives with, and cares for her elderly mother.   Social Drivers of Health   Tobacco Use: Low Risk (07/04/2024)   Patient History    Smoking Tobacco Use: Never    Smokeless Tobacco Use: Never    Passive Exposure: Not on file  Financial Resource Strain:  Medium Risk (05/08/2024)   Overall Financial Resource Strain (CARDIA)    Difficulty of Paying Living Expenses: Somewhat hard  Food Insecurity: Food Insecurity Present (05/08/2024)   Epic    Worried About Programme Researcher, Broadcasting/film/video in the Last Year: Sometimes true    Ran Out of Food in the Last Year: Sometimes true  Transportation Needs: No Transportation Needs (05/08/2024)   Epic    Lack of Transportation (Medical): No    Lack of Transportation (Non-Medical): No  Physical Activity: Sufficiently Active (05/08/2024)   Exercise Vital Sign    Days of Exercise per Week: 7 days    Minutes of Exercise per Session: 30 min  Stress: No Stress Concern Present (05/08/2024)   [employer] of Occupational Health - Occupational Stress Questionnaire    Feeling of Stress: Not at all  Social Connections: Moderately Integrated (05/08/2024)   Social Connection and Isolation Panel    Frequency of Communication with Friends and Family: More than three times a week    Frequency of Social Gatherings with Friends and Family: More than three times a week    Attends Religious Services: More than 4 times per year    Active Member of Clubs or Organizations: Yes    Attends Banker Meetings: More than 4 times per year    Marital Status: Never married  Intimate Partner Violence: Not At Risk (05/12/2024)   Epic    Fear of Current or Ex-Partner: No    Emotionally Abused: No    Physically Abused: No    Sexually Abused: No  Depression (PHQ2-9): Medium Risk (05/27/2024)   Depression (PHQ2-9)    PHQ-2 Score: 9  Alcohol Screen: Low Risk (05/08/2024)   Alcohol Screen    Last Alcohol Screening Score (AUDIT): 0  Housing: Low Risk (05/08/2024)   Epic    Unable to Pay for Housing in the Last Year: No    Number of Times Moved in the Last Year: 0    Homeless in the Last Year: No  Utilities: Not At Risk (05/12/2024)   Epic    Threatened with loss of utilities: No  Health Literacy: Adequate Health Literacy  (05/12/2024)   B1300 Health Literacy    Frequency of  need for help with medical instructions: Never    FAMILY HISTORY: Family History  Problem Relation Age of Onset   Diabetes Mother    Heart disease Mother        Pacemaker, CHF   Hypertension Mother    Kidney cancer Mother    Cancer Mother    Kidney disease Mother    Stroke Father    CAD Father 4       Died with MI   Heart disease Father    Esophageal cancer Brother    Diabetes Brother    Heart disease Sister    Cancer Brother    Diabetes Sister    Colon cancer Neg Hx    Rectal cancer Neg Hx    Stomach cancer Neg Hx    Pancreatic cancer Neg Hx     ALLERGIES:  is allergic to cholestyramine, simvastatin , lipitor [atorvastatin], and niacin.  MEDICATIONS:  Current Outpatient Medications  Medication Sig Dispense Refill   Accu-Chek Softclix Lancets lancets TEST BLOOD SUGAR EVERY DAY 100 each 1   acetaminophen  (TYLENOL ) 500 MG tablet Take 1,000 mg by mouth every 6 (six) hours as needed for mild pain.      Alcohol Swabs (DROPSAFE ALCOHOL PREP) 70 % PADS USE ONE TIME DAILY AS DIRECTED 100 each 1   amitriptyline  (ELAVIL ) 25 MG tablet Take 1 tablet (25 mg total) by mouth at bedtime. 90 tablet 2   aspirin 81 MG tablet Take 81 mg by mouth at bedtime.      Blood Glucose Calibration (ACCU-CHEK AVIVA) SOLN USE TO CALIBRATE METER AS DIRECTED 1 each 2   Blood Glucose Monitoring Suppl (ACCU-CHEK AVIVA PLUS) w/Device KIT Use to check blood sugar once daily for DM (dx. E11.9) 1 kit 0   calcium elemental as carbonate (BARIATRIC TUMS ULTRA) 400 MG chewable tablet Chew 1,000 mg by mouth daily as needed for heartburn.     Camphor-Menthol-Methyl Sal (SALONPAS) 3.07-19-08 % PTCH Apply topically.     carvedilol  (COREG ) 25 MG tablet Take 1 tablet (25 mg total) by mouth 2 (two) times daily. 180 tablet 3   Cholecalciferol (VITAMIN D ) 2000 units tablet Take 2,000 Units by mouth daily.     colchicine  0.6 MG tablet Take 1 tablet (0.6 mg total) by mouth  daily. 30 tablet 0   Cyanocobalamin  (B-12) 2500 MCG SUBL Place 1 tablet under the tongue daily. 30 tablet 5   Docusate Sodium  (STOOL SOFTENER) 100 MG capsule Take 100 mg by mouth in the morning and at bedtime.     ezetimibe  (ZETIA ) 10 MG tablet TAKE 1 TABLET EVERY DAY 90 tablet 1   fenofibrate  (TRICOR ) 145 MG tablet TAKE 1 TABLET EVERY DAY 90 tablet 1   gabapentin  (NEURONTIN ) 300 MG capsule TAKE 1 CAPSULE THREE TIMES DAILY AS NEEDED 90 capsule 3   glucose blood (ACCU-CHEK AVIVA PLUS) test strip TEST BLOOD SUGAR EVERY DAY FOR DIABETES 100 strip 0   loratadine (CLARITIN) 10 MG tablet Take 10 mg by mouth daily as needed for allergies.     losartan  (COZAAR ) 50 MG tablet Take 1 tablet (50 mg total) by mouth daily. 30 tablet 3   meclizine  (ANTIVERT ) 25 MG tablet Take 1 tablet (25 mg total) by mouth 3 (three) times daily as needed for dizziness. 90 tablet 3   metFORMIN  (GLUCOPHAGE ) 1000 MG tablet TAKE 1/2 TABLET TWICE DAILY WITH MEALS 90 tablet 1   NEXLETOL  180 MG TABS TAKE 1 TABLET EVERY DAY 90 tablet 3   omeprazole  (PRILOSEC)  40 MG capsule TAKE 1 CAPSULE IN THE MORNING AND AT BEDTIME. 180 capsule 1   ondansetron  (ZOFRAN ) 4 MG tablet TAKE 1 TABLET EVERY 4 HOURS AS NEEDED FOR NAUSEA AND VOMITING 90 tablet 0   SALINE NASAL SPRAY NA Place 1 spray into the nose daily as needed (congestion).      tirzepatide  (MOUNJARO ) 7.5 MG/0.5ML Pen Inject 7.5 mg into the skin once a week. 2 mL 2   No current facility-administered medications for this visit.     PHYSICAL EXAMINATION: ECOG PERFORMANCE STATUS: 1 - Symptomatic but completely ambulatory Vitals:   07/04/24 1319  BP: 139/62  Pulse: 91  Resp: 19  Temp: 98 F (36.7 C)  SpO2: 98%   Filed Weights   07/04/24 1319  Weight: 163 lb 4.8 oz (74.1 kg)    Physical Exam Constitutional:      General: She is not in acute distress. HENT:     Head: Normocephalic and atraumatic.  Eyes:     General: No scleral icterus. Cardiovascular:     Rate and  Rhythm: Normal rate.  Pulmonary:     Effort: Pulmonary effort is normal. No respiratory distress.     Breath sounds: No wheezing.  Abdominal:     General: Bowel sounds are normal. There is no distension.     Palpations: Abdomen is soft.  Musculoskeletal:        General: Normal range of motion.     Cervical back: Normal range of motion and neck supple.  Skin:    Findings: No erythema or rash.  Neurological:     Mental Status: She is alert and oriented to person, place, and time. Mental status is at baseline.  Psychiatric:        Mood and Affect: Mood normal.      LABORATORY DATA:  I have reviewed the data as listed    Latest Ref Rng & Units 07/01/2024    1:13 PM 06/24/2024   12:41 PM 05/30/2024    1:08 PM  CBC  WBC 4.0 - 10.5 K/uL 7.4   6.0   Hemoglobin 12.0 - 15.0 g/dL 89.4  9.6  8.9   Hematocrit 36.0 - 46.0 % 32.6  29.8  27.7   Platelets 150 - 400 K/uL 468   363       Latest Ref Rng & Units 04/19/2024    2:02 PM 04/19/2024    2:01 PM 02/01/2024    7:31 AM  CMP  Glucose 70 - 99 mg/dL  890  873   BUN 8 - 23 mg/dL  19  23   Creatinine 9.55 - 1.00 mg/dL  8.85  9.05   Sodium 864 - 145 mmol/L  133  137   Potassium 3.5 - 5.1 mmol/L  4.7  4.6   Chloride 98 - 111 mmol/L  100  100   CO2 22 - 32 mmol/L  20  24   Calcium 8.9 - 10.3 mg/dL  9.1  9.4   Total Protein 6.5 - 8.1 g/dL 6.6   6.4   Total Bilirubin 0.0 - 1.2 mg/dL 0.7   0.3   Alkaline Phos 38 - 126 U/L 38   48   AST 15 - 41 U/L 32   16   ALT 0 - 44 U/L 12   11      Iron /TIBC/Ferritin/ %Sat    Component Value Date/Time   IRON  47 07/01/2024 1313   TIBC 375 07/01/2024 1313   FERRITIN 61 07/01/2024 1313  IRONPCTSAT 12 07/01/2024 1313      "

## 2024-07-04 NOTE — Assessment & Plan Note (Signed)
 B12 level is normal, continue sublingual B12 2500mcg 2 times per week  Hold off B12 injection

## 2024-07-04 NOTE — Assessment & Plan Note (Addendum)
 Iron  deficiency anemia Chronic blood loss vs malabsorption.  She has had GI work up in the past.  Labs reviewed and discussed with patient.   Lab Results  Component Value Date   HGB 10.5 (L) 07/01/2024   TIBC 375 07/01/2024   IRONPCTSAT 12 07/01/2024   FERRITIN 61 07/01/2024   Patient is status post IV Venofer  treatments.   Option of additional IV Venofer  treatments to further improve iron  stores was discussed.  Patient declined.  I recommend patient to try over-the-counter oral iron  supplementation as maintenance.

## 2024-07-04 NOTE — Assessment & Plan Note (Signed)
 No M protein on multiple myeloma panel, normal light chain ratio reticulocyte panel,  Hemoglobin is above 10.  Hold off erythropoietin replacement therapy.

## 2024-07-08 ENCOUNTER — Ambulatory Visit
Admission: RE | Admit: 2024-07-08 | Discharge: 2024-07-08 | Disposition: A | Discharge status: Home / Self Care | Source: Ambulatory Visit | Attending: Family Medicine | Admitting: Family Medicine

## 2024-07-08 DIAGNOSIS — Z1231 Encounter for screening mammogram for malignant neoplasm of breast: Secondary | ICD-10-CM

## 2024-07-14 ENCOUNTER — Ambulatory Visit: Payer: Self-pay | Admitting: Family Medicine

## 2024-07-20 ENCOUNTER — Other Ambulatory Visit: Payer: Self-pay | Admitting: Family Medicine

## 2024-07-20 ENCOUNTER — Other Ambulatory Visit: Payer: Self-pay | Admitting: Cardiology

## 2024-07-27 ENCOUNTER — Other Ambulatory Visit: Payer: Self-pay | Admitting: Family Medicine

## 2024-07-27 ENCOUNTER — Encounter: Payer: Self-pay | Admitting: Family Medicine

## 2024-07-27 NOTE — Telephone Encounter (Signed)
 Last filled on 05/06/23 # 2 mL/ 2 refills   Last OV was on 05/27/24, no future

## 2024-07-27 NOTE — Telephone Encounter (Signed)
 Due for a DM follow up when able

## 2024-08-01 ENCOUNTER — Inpatient Hospital Stay

## 2024-08-02 ENCOUNTER — Ambulatory Visit: Payer: Self-pay | Admitting: Family Medicine

## 2024-08-02 ENCOUNTER — Ambulatory Visit: Admitting: Family Medicine

## 2024-08-02 ENCOUNTER — Encounter: Payer: Self-pay | Admitting: Family Medicine

## 2024-08-02 VITALS — BP 132/70 | HR 93 | Temp 98.3°F | Ht 59.0 in | Wt 163.5 lb

## 2024-08-02 DIAGNOSIS — E785 Hyperlipidemia, unspecified: Secondary | ICD-10-CM

## 2024-08-02 DIAGNOSIS — E119 Type 2 diabetes mellitus without complications: Secondary | ICD-10-CM

## 2024-08-02 DIAGNOSIS — I10 Essential (primary) hypertension: Secondary | ICD-10-CM | POA: Diagnosis not present

## 2024-08-02 DIAGNOSIS — N1831 Chronic kidney disease, stage 3a: Secondary | ICD-10-CM | POA: Diagnosis not present

## 2024-08-02 DIAGNOSIS — Z7985 Long-term (current) use of injectable non-insulin antidiabetic drugs: Secondary | ICD-10-CM | POA: Diagnosis not present

## 2024-08-02 DIAGNOSIS — Z8739 Personal history of other diseases of the musculoskeletal system and connective tissue: Secondary | ICD-10-CM

## 2024-08-02 DIAGNOSIS — E1169 Type 2 diabetes mellitus with other specified complication: Secondary | ICD-10-CM | POA: Diagnosis not present

## 2024-08-02 DIAGNOSIS — D631 Anemia in chronic kidney disease: Secondary | ICD-10-CM

## 2024-08-02 LAB — LIPID PANEL
Cholesterol: 114 mg/dL (ref 28–200)
HDL: 16 mg/dL — ABNORMAL LOW
NonHDL: 97.63
Total CHOL/HDL Ratio: 7
Triglycerides: 441 mg/dL — ABNORMAL HIGH (ref 10.0–149.0)
VLDL: 88.2 mg/dL — ABNORMAL HIGH (ref 0.0–40.0)

## 2024-08-02 LAB — COMPREHENSIVE METABOLIC PANEL WITH GFR
ALT: 17 U/L (ref 3–35)
AST: 21 U/L (ref 5–37)
Albumin: 3.5 g/dL (ref 3.5–5.2)
Alkaline Phosphatase: 36 U/L — ABNORMAL LOW (ref 39–117)
BUN: 23 mg/dL (ref 6–23)
CO2: 26 meq/L (ref 19–32)
Calcium: 9.3 mg/dL (ref 8.4–10.5)
Chloride: 106 meq/L (ref 96–112)
Creatinine, Ser: 0.89 mg/dL (ref 0.40–1.20)
GFR: 64.27 mL/min
Glucose, Bld: 115 mg/dL — ABNORMAL HIGH (ref 70–99)
Potassium: 4.5 meq/L (ref 3.5–5.1)
Sodium: 139 meq/L (ref 135–145)
Total Bilirubin: 0.4 mg/dL (ref 0.2–1.2)
Total Protein: 6.7 g/dL (ref 6.0–8.3)

## 2024-08-02 LAB — POCT GLYCOSYLATED HEMOGLOBIN (HGB A1C): Hemoglobin A1C: 5.4 % (ref 4.0–5.6)

## 2024-08-02 LAB — LDL CHOLESTEROL, DIRECT: Direct LDL: 45 mg/dL

## 2024-08-02 NOTE — Patient Instructions (Addendum)
 Continue chair exercise with strength training  At least 3 days per week   No change in medicines   Keep eating healthy  Low sugar and low fat  More veggies and lean protein    Labs today for cholesterol and chem levels

## 2024-08-02 NOTE — Assessment & Plan Note (Signed)
 Mounjaro 7.5 mg weekly

## 2024-08-02 NOTE — Assessment & Plan Note (Signed)
 Gfr 51 Monitoring Encouraged good fluid intake On arb

## 2024-08-02 NOTE — Assessment & Plan Note (Signed)
Continues hematology care

## 2024-08-02 NOTE — Assessment & Plan Note (Signed)
 bp in fair control at this time  BP Readings from Last 1 Encounters:  08/02/24 132/70   No changes needed Most recent labs reviewed  Disc lifstyle change with low sodium diet and exercise   Losartan  50 mg qd Amlodipine  5 mg daily  Carvediol 25 mg bid   No longer hypotensive Under cardiology care also

## 2024-08-02 NOTE — Assessment & Plan Note (Signed)
 Lab Results  Component Value Date   HGBA1C 5.4 08/02/2024   HGBA1C 5.9 02/01/2024   HGBA1C 6.4 (A) 10/21/2023   Lab Results  Component Value Date   MICROALBUR 2.0 (H) 10/21/2023   MICROALBUR 1.1 04/29/2010     Continues mounjaro  7.5 mg weekly (does not tolerate higher doses)  Metformin  500 mg bid   Working on lifestyle change and weight loss

## 2024-08-02 NOTE — Progress Notes (Signed)
 "  Subjective:    Patient ID: Martha Hamilton, female    DOB: [birth year], 74 y.o.   MRN: [id number]  HPI  Wt Readings from Last 3 Encounters:  08/02/24 163 lb 8 oz (74.2 kg)  07/04/24 163 lb 4.8 oz (74.1 kg)  05/27/24 166 lb 2 oz (75.4 kg)   33.02 kg/m  Vitals:   08/02/24 0956  BP: 132/70  Pulse: 93  Temp: 98.3 F (36.8 C)  SpO2: 97%     Pt presents for follow up of DM2 HTN Hyperlipidemia   Feeling good Staying in with cold weather   HTN bp is stable today  No cp or palpitations or headaches or edema  No side effects to medicines  BP Readings from Last 3 Encounters:  08/02/24 132/70  07/04/24 139/62  06/24/24 (!) 120/57     Lab Results  Component Value Date   NA 133 (L) 04/19/2024   K 4.7 04/19/2024   CO2 20 (L) 04/19/2024   GLUCOSE 109 (H) 04/19/2024   BUN 19 04/19/2024   CREATININE 1.14 (H) 04/19/2024   CALCIUM 9.1 04/19/2024   GFR 60.40 02/01/2024   GFRNONAA 51 (L) 04/19/2024   Losartan  50 mg qd Amlodipine  5 mg daily  Carvediol 25 mg bid   Meds changed Less low readings  Feeling better   DM2 A1c 5.4 today Lab Results  Component Value Date   HGBA1C 5.4 08/02/2024   HGBA1C 5.9 02/01/2024   HGBA1C 6.4 (A) 10/21/2023   Lab Results  Component Value Date   MICROALBUR 2.0 (H) 10/21/2023   MICROALBUR 1.1 04/29/2010   Mounjaro  7.5 mg weekly  (wish it made appetite lower but cannot tolerate higher dose) Metformin  500 mg bid   Eats healthy  Does not eat between meals   Avoids added sugar and simple carbs   Foot exam -nl  Trying to walk more for exercise  Knees give her trouble  Using weights for arms  Also has elastic bands  Chair exercise program  May consider PT in future- when weather gets better   Hyperlipidemia Lab Results  Component Value Date   CHOL 143 02/01/2024   HDL 16.90 (L) 02/01/2024   LDLCALC 47 03/06/2023   LDLDIRECT 37.0 02/01/2024   TRIG (H) 02/01/2024    559.0 Triglyceride is over 400; calculations on  Lipids are invalid.   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   Muscle cramps 02/28/2022   Neck pain 02/28/2022   Strain of flexor tendon of wrist 12/02/2021   Tendinitis of left wrist 12/02/2021   Left wrist pain 11/13/2021   Family history of cancer 08/27/2020   History of breast cancer 08/27/2020   IDA (iron  deficiency anemia) 08/27/2020   B12 deficiency 08/27/2020   CKD (chronic kidney disease), stage III (HCC) 07/24/2020   Tachycardia 01/11/2019   Fatty liver 06/21/2018   Obesity (BMI 30-39.9) 06/21/2018   GERD (gastroesophageal reflux disease) 05/25/2018   H/O vertigo 05/11/2018   Essential (hemorrhagic) thrombocythemia (  HCC) 12/25/2017   Tear of medial meniscus of knee 12/21/2017   Pain in left knee 12/21/2017   Osteoarthritis of knee 10/09/2017   Left knee pain 09/21/2017   Grief reaction 12/17/2016   Estrogen deficiency 09/08/2016   Myalgia 02/08/2016   Anemia in chronic kidney disease (CKD) 02/09/2013   Abnormal EKG 07/27/2012   Special screening for malignant neoplasms, colon 04/29/2011   Encounter for gynecological examination 04/29/2011   Routine general medical examination at a health care facility 04/20/2011   Osteoarthrosis, hand 10/31/2008   Controlled type 2 diabetes mellitus without complication, without long-term current use of insulin (HCC) 07/10/2008   Hyperlipidemia associated with type 2 diabetes mellitus (HCC) 01/26/2008   Essential hypertension 01/26/2008   Allergic rhinitis 12/07/2007    Past Medical History:  Diagnosis Date   Allergic rhinitis    Allergy 1957   Asthma    Breast cancer (HCC) 1996   right breast   Cataract Oct. 2021   Depression 1981   DM2 (diabetes mellitus, type 2) (HCC)    GERD (gastroesophageal reflux disease)    Hx of cardiovascular stress test    a. Lex MV 2/14:  EF 69%, no ischemia   Hyperlipidemia    Hypertension    IDA (iron  deficiency anemia) 08/27/2020   Iron  deficiency anemia    Obesity    Osteoarthritis    hands   Thyroid  nodule 06/29/2023   Ulcer    Past Surgical History:  Procedure Laterality Date   APPENDECTOMY  1976   BREAST LUMPECTOMY Right 1996   right breast, lumpectomy w/nodes   CATARACT EXTRACTION W/PHACO Left 11/28/2020   Procedure: CATARACT EXTRACTION PHACO AND INTRAOCULAR LENS PLACEMENT (IOC) LEFT DIABETIC;  Surgeon: Mittie Gaskin, MD;  Location: [facility];  Service: Ophthalmology;  Laterality: Left;  5.02 01:07.4 7.4%   CATARACT EXTRACTION W/PHACO Right 12/12/2020   Procedure: CATARACT EXTRACTION PHACO AND INTRAOCULAR LENS PLACEMENT (IOC) RIGHT DIABETIC 5.22 00:59.2;  Surgeon: Mittie Gaskin, MD;  Location: [facility];  Service: Ophthalmology;  Laterality: Right;   CHONDROPLASTY Left 02/10/2018   Procedure: CHONDROPLASTY;  Surgeon: Leora Lynwood SAUNDERS, MD;  Location: [facility];  Service: Orthopedics;  Laterality: Left;   EYE SURGERY  2021   FOOT SURGERY Left 07/2022   bone removal   GASTRIC RESTRICTION SURGERY     for reflux   HYSTEROSCOPY WITH D & C N/A 10/18/2012   Procedure: DILATATION AND CURETTAGE /HYSTEROSCOPY;  Surgeon: Winton Felt, MD;  Location: [facility];  Service: Gynecology;  Laterality: N/A;   KNEE ARTHROSCOPY WITH MEDIAL MENISECTOMY Left 02/10/2018   Procedure: KNEE ARTHROSCOPY WITH MEDIAL and LATERAL MENISECTOMY;  Surgeon: Leora Lynwood SAUNDERS, MD;  Location: [facility];  Service: Orthopedics;  Laterality: Left;   POLYPECTOMY N/A 10/18/2012   Procedure: POLYPECTOMY;   Surgeon: Winton Felt, MD;  Location: [facility];  Service: Gynecology;  Laterality: N/A;   SYNOVECTOMY Left 02/10/2018   Procedure: SYNOVECTOMY;  Surgeon: Leora Lynwood SAUNDERS, MD;  Location: [facility];  Service: Orthopedics;  Laterality: Left;   Social History[1] Family History  Problem Relation Age of Onset   Diabetes Mother    Heart disease Mother        Pacemaker, CHF   Hypertension Mother    Kidney cancer Mother    Cancer Mother    Kidney disease Mother    Stroke Father    CAD Father 63       Died with MI   Heart disease Father    Esophageal cancer  Brother    Diabetes Brother    Heart disease Sister    Cancer Brother    Diabetes Sister    Colon cancer Neg Hx    Rectal cancer Neg Hx    Stomach cancer Neg Hx    Pancreatic cancer Neg Hx    Allergies[2] Medications Ordered Prior to Encounter[3]  Review of Systems  Constitutional:  Negative for activity change, appetite change, fatigue, fever and unexpected weight change.  HENT:  Negative for congestion, ear pain, rhinorrhea, sinus pressure and sore throat.   Eyes:  Negative for pain, redness and visual disturbance.  Respiratory:  Negative for cough, shortness of breath and wheezing.   Cardiovascular:  Negative for chest pain and palpitations.  Gastrointestinal:  Negative for abdominal pain, blood in stool, constipation and diarrhea.  Endocrine: Negative for polydipsia and polyuria.  Genitourinary:  Negative for dysuria, frequency and urgency.  Musculoskeletal:  Negative for arthralgias, back pain and myalgias.  Skin:  Negative for pallor and rash.  Allergic/Immunologic: Negative for environmental allergies.  Neurological:  Negative for dizziness, syncope and headaches.  Hematological:  Negative for adenopathy. Does not bruise/bleed easily.  Psychiatric/Behavioral:  Negative for decreased concentration and dysphoric mood. The patient is not nervous/anxious.        Objective:   Physical Exam Constitutional:      General:  She is not in acute distress.    Appearance: Normal appearance. She is well-developed. She is obese. She is not ill-appearing or diaphoretic.  HENT:     Head: Normocephalic and atraumatic.  Eyes:     Conjunctiva/sclera: Conjunctivae normal.     Pupils: Pupils are equal, round, and reactive to light.  Neck:     Thyroid : No thyromegaly.     Vascular: No carotid bruit or JVD.  Cardiovascular:     Rate and Rhythm: Normal rate and regular rhythm.     Heart sounds: Normal heart sounds.     No gallop.  Pulmonary:     Effort: Pulmonary effort is normal. No respiratory distress.     Breath sounds: Normal breath sounds. No wheezing or rales.  Abdominal:     General: There is no distension or abdominal bruit.     Palpations: Abdomen is soft.  Musculoskeletal:     Cervical back: Normal range of motion and neck supple.     Right lower leg: No edema.     Left lower leg: No edema.  Lymphadenopathy:     Cervical: No cervical adenopathy.  Skin:    General: Skin is warm and dry.     Coloration: Skin is not pale.     Findings: No rash.  Neurological:     Mental Status: She is alert.     Coordination: Coordination normal.     Deep Tendon Reflexes: Reflexes are normal and symmetric. Reflexes normal.  Psychiatric:        Mood and Affect: Mood normal.           Assessment & Plan:   Problem List Items Addressed This Visit       Cardiovascular and Mediastinum   Essential hypertension   bp in fair control at this time  BP Readings from Last 1 Encounters:  08/02/24 132/70   No changes needed Most recent labs reviewed  Disc lifstyle change with low sodium diet and exercise   Losartan  50 mg qd Amlodipine  5 mg daily  Carvediol 25 mg bid   No longer hypotensive Under cardiology care also  Relevant Orders   Comprehensive metabolic panel with GFR   Lipid panel     Endocrine   Hyperlipidemia associated with type 2 diabetes mellitus (HCC)   Disc goals for lipids and reasons  to control them Rev last labs with pt Rev low sat fat diet in detail  Lab today  Continues Zetia  10 mg daily  Tricor  145 mg daily  Bempedoic acid  180 mg daily        Relevant Orders   Comprehensive metabolic panel with GFR   Lipid panel   Controlled type 2 diabetes mellitus without complication, without long-term current use of insulin (HCC) - Primary   Lab Results  Component Value Date   HGBA1C 5.4 08/02/2024   HGBA1C 5.9 02/01/2024   HGBA1C 6.4 (A) 10/21/2023   Lab Results  Component Value Date   MICROALBUR 2.0 (H) 10/21/2023   MICROALBUR 1.1 04/29/2010     Continues mounjaro  7.5 mg weekly (does not tolerate higher doses)  Metformin  500 mg bid   Working on lifestyle change and weight loss       Relevant Orders   POCT HgB A1C (Completed)     Genitourinary   Anemia in chronic kidney disease (CKD) (Chronic)   Continues hematology care       Relevant Medications   Ferrous Sulfate  (SLOW FE PO)   CKD (chronic kidney disease), stage III (HCC)   Gfr 51 Monitoring Encouraged good fluid intake On arb        Other   Long-term (current) use of injectable non-insulin antidiabetic drugs   Mounjaro  7.5 mg weekly      History of gout   Per pt prn colchicine  from podiatry  Encouraged good fluid intake          [1]  Social History Tobacco Use   Smoking status: Never   Smokeless tobacco: Never   Tobacco comments:    Never smoked  Vaping Use   Vaping status: Never Used  Substance Use Topics   Alcohol use: Never   Drug use: Never  [2]  Allergies Allergen Reactions   Cholestyramine Other (See Comments)    REACTION: Muscles tightened up, drew up.   Simvastatin  Other (See Comments)    Muscular pain   Lipitor [Atorvastatin] Other (See Comments)    Muscle cramping   Niacin Nausea And Vomiting  [3]  Current Outpatient Medications on File Prior to Visit  Medication Sig Dispense Refill   Accu-Chek Softclix Lancets lancets TEST BLOOD SUGAR EVERY DAY 100  each 1   acetaminophen  (TYLENOL ) 500 MG tablet Take 1,000 mg by mouth every 6 (six) hours as needed for mild pain.      Alcohol Swabs (DROPSAFE ALCOHOL PREP) 70 % PADS USE ONE TIME DAILY AS DIRECTED 100 each 1   amitriptyline  (ELAVIL ) 25 MG tablet Take 1 tablet (25 mg total) by mouth at bedtime. 90 tablet 2   aspirin 81 MG tablet Take 81 mg by mouth at bedtime.      Blood Glucose Calibration (ACCU-CHEK AVIVA) SOLN USE TO CALIBRATE METER AS DIRECTED 1 each 2   Blood Glucose Monitoring Suppl (ACCU-CHEK AVIVA PLUS) w/Device KIT Use to check blood sugar once daily for DM (dx. E11.9) 1 kit 0   calcium elemental as carbonate (BARIATRIC TUMS ULTRA) 400 MG chewable tablet Chew 1,000 mg by mouth daily as needed for heartburn.     Camphor-Menthol-Methyl Sal (SALONPAS) 3.07-19-08 % PTCH Apply topically.     carvedilol  (COREG ) 25 MG tablet Take 1 tablet (  25 mg total) by mouth 2 (two) times daily. 180 tablet 3   Cholecalciferol (VITAMIN D ) 2000 units tablet Take 2,000 Units by mouth daily.     colchicine  0.6 MG tablet Take 1 tablet (0.6 mg total) by mouth daily. 30 tablet 0   Cyanocobalamin  (B-12) 2500 MCG SUBL Place 1 tablet under the tongue daily. 30 tablet 5   Docusate Sodium  (STOOL SOFTENER) 100 MG capsule Take 100 mg by mouth in the morning and at bedtime.     ezetimibe  (ZETIA ) 10 MG tablet TAKE 1 TABLET EVERY DAY 90 tablet 1   fenofibrate  (TRICOR ) 145 MG tablet TAKE 1 TABLET EVERY DAY 90 tablet 1   Ferrous Sulfate  (SLOW FE PO) Take 1 tablet by mouth daily.     gabapentin  (NEURONTIN ) 300 MG capsule TAKE 1 CAPSULE THREE TIMES DAILY AS NEEDED 90 capsule 3   glucose blood (ACCU-CHEK AVIVA PLUS) test strip TEST BLOOD SUGAR EVERY DAY FOR DIABETES 100 strip 0   loratadine (CLARITIN) 10 MG tablet Take 10 mg by mouth daily as needed for allergies.     losartan  (COZAAR ) 50 MG tablet TAKE 1 TABLET EVERY DAY 90 tablet 0   meclizine  (ANTIVERT ) 25 MG tablet Take 1 tablet (25 mg total) by mouth 3 (three) times daily as  needed for dizziness. 90 tablet 3   metFORMIN  (GLUCOPHAGE ) 1000 MG tablet TAKE 1/2 TABLET TWICE DAILY WITH MEALS 90 tablet 1   MOUNJARO  7.5 MG/0.5ML Pen ADMINISTER 7.5 MG UNDER THE SKIN 1 TIME A WEEK 2 mL 2   Multiple Vitamins-Minerals (ONE A DAY WOMENS MULTI) TABS Take 1 capsule by mouth daily.     NEXLETOL  180 MG TABS TAKE 1 TABLET EVERY DAY 90 tablet 3   omeprazole  (PRILOSEC) 40 MG capsule TAKE 1 CAPSULE IN THE MORNING AND AT BEDTIME. 180 capsule 1   ondansetron  (ZOFRAN ) 4 MG tablet TAKE 1 TABLET EVERY 4 HOURS AS NEEDED FOR NAUSEA AND VOMITING 90 tablet 0   SALINE NASAL SPRAY NA Place 1 spray into the nose daily as needed (congestion).      No current facility-administered medications on file prior to visit.   "

## 2024-08-02 NOTE — Assessment & Plan Note (Signed)
 Disc goals for lipids and reasons to control them Rev last labs with pt Rev low sat fat diet in detail  Lab today  Continues Zetia  10 mg daily  Tricor  145 mg daily  Bempedoic acid  180 mg daily

## 2024-08-02 NOTE — Assessment & Plan Note (Signed)
 Per pt prn colchicine  from podiatry  Encouraged good fluid intake

## 2024-08-05 ENCOUNTER — Inpatient Hospital Stay

## 2024-08-05 ENCOUNTER — Inpatient Hospital Stay: Attending: Internal Medicine

## 2024-08-05 DIAGNOSIS — E538 Deficiency of other specified B group vitamins: Secondary | ICD-10-CM | POA: Insufficient documentation

## 2024-08-05 DIAGNOSIS — D631 Anemia in chronic kidney disease: Secondary | ICD-10-CM | POA: Insufficient documentation

## 2024-08-05 DIAGNOSIS — D508 Other iron deficiency anemias: Secondary | ICD-10-CM

## 2024-08-05 DIAGNOSIS — D509 Iron deficiency anemia, unspecified: Secondary | ICD-10-CM | POA: Insufficient documentation

## 2024-08-05 DIAGNOSIS — N189 Chronic kidney disease, unspecified: Secondary | ICD-10-CM | POA: Insufficient documentation

## 2024-08-05 LAB — HEMOGLOBIN AND HEMATOCRIT (CANCER CENTER ONLY)
HCT: 31.8 % — ABNORMAL LOW (ref 36.0–46.0)
Hemoglobin: 10.2 g/dL — ABNORMAL LOW (ref 12.0–15.0)

## 2024-08-05 NOTE — Progress Notes (Signed)
 Hgb 10.2 per MD not hold retacrit  if >10. Patient aware verbalizes understanding

## 2024-08-09 MED ORDER — ICOSAPENT ETHYL 1 G PO CAPS: 2.0000 g | ORAL_CAPSULE | Freq: Two times a day (BID) | ORAL | 3 refills | Status: AC

## 2024-08-18 ENCOUNTER — Ambulatory Visit: Payer: Medicare PPO | Admitting: "Endocrinology

## 2024-09-05 ENCOUNTER — Inpatient Hospital Stay: Attending: Internal Medicine

## 2024-09-05 ENCOUNTER — Inpatient Hospital Stay

## 2024-09-30 ENCOUNTER — Inpatient Hospital Stay

## 2024-10-06 ENCOUNTER — Inpatient Hospital Stay

## 2024-10-06 ENCOUNTER — Inpatient Hospital Stay: Admitting: Oncology

## 2025-01-30 ENCOUNTER — Other Ambulatory Visit

## 2025-02-06 ENCOUNTER — Encounter: Admitting: Family Medicine
